# Patient Record
Sex: Female | Born: 1985 | Race: White | Hispanic: No | Marital: Single | Smoking: Current every day smoker
Health system: Southern US, Community
[De-identification: ages and names within clinical notes are randomized; demographics above are authoritative.]

## PROBLEM LIST (undated history)

## (undated) DIAGNOSIS — J45909 Unspecified asthma, uncomplicated: Secondary | ICD-10-CM

## (undated) DIAGNOSIS — I10 Essential (primary) hypertension: Secondary | ICD-10-CM

## (undated) DIAGNOSIS — E119 Type 2 diabetes mellitus without complications: Secondary | ICD-10-CM

## (undated) HISTORY — PX: CHOLECYSTECTOMY: SHX55

---

## 2005-02-09 ENCOUNTER — Observation Stay: Payer: Self-pay | Admitting: Obstetrics and Gynecology

## 2005-06-20 ENCOUNTER — Ambulatory Visit: Payer: Self-pay | Admitting: Family Medicine

## 2005-06-23 ENCOUNTER — Observation Stay: Payer: Self-pay

## 2005-07-02 ENCOUNTER — Observation Stay: Payer: Self-pay | Admitting: Obstetrics and Gynecology

## 2005-07-03 ENCOUNTER — Inpatient Hospital Stay: Payer: Self-pay | Admitting: Obstetrics and Gynecology

## 2005-09-12 ENCOUNTER — Emergency Department: Payer: Self-pay | Admitting: Emergency Medicine

## 2005-09-12 ENCOUNTER — Other Ambulatory Visit: Payer: Self-pay

## 2005-09-13 ENCOUNTER — Ambulatory Visit: Payer: Self-pay | Admitting: Emergency Medicine

## 2005-09-13 ENCOUNTER — Emergency Department: Payer: Self-pay | Admitting: Internal Medicine

## 2005-09-18 ENCOUNTER — Emergency Department: Payer: Self-pay | Admitting: Emergency Medicine

## 2005-09-19 ENCOUNTER — Ambulatory Visit: Payer: Self-pay | Admitting: General Surgery

## 2007-06-26 ENCOUNTER — Emergency Department: Payer: Self-pay | Admitting: Emergency Medicine

## 2007-10-21 ENCOUNTER — Emergency Department: Payer: Self-pay | Admitting: Emergency Medicine

## 2008-06-21 ENCOUNTER — Emergency Department: Payer: Self-pay | Admitting: Emergency Medicine

## 2008-07-24 ENCOUNTER — Emergency Department: Payer: Self-pay | Admitting: Emergency Medicine

## 2008-08-28 ENCOUNTER — Emergency Department: Payer: Self-pay | Admitting: Emergency Medicine

## 2009-06-12 ENCOUNTER — Ambulatory Visit: Payer: Self-pay | Admitting: Family Medicine

## 2009-06-14 ENCOUNTER — Emergency Department: Payer: Self-pay | Admitting: Internal Medicine

## 2009-09-05 ENCOUNTER — Ambulatory Visit: Payer: Self-pay | Admitting: Family Medicine

## 2009-10-10 ENCOUNTER — Inpatient Hospital Stay: Payer: Self-pay | Admitting: Obstetrics and Gynecology

## 2011-06-11 ENCOUNTER — Emergency Department: Payer: Self-pay

## 2011-06-12 ENCOUNTER — Emergency Department: Payer: Self-pay | Admitting: Emergency Medicine

## 2012-03-23 ENCOUNTER — Ambulatory Visit: Payer: Self-pay

## 2012-03-29 ENCOUNTER — Emergency Department: Payer: Self-pay | Admitting: Emergency Medicine

## 2012-03-31 ENCOUNTER — Emergency Department: Payer: Self-pay | Admitting: Emergency Medicine

## 2012-04-01 ENCOUNTER — Encounter: Payer: Self-pay | Admitting: Obstetrics & Gynecology

## 2012-04-14 ENCOUNTER — Ambulatory Visit: Payer: Self-pay

## 2012-04-28 ENCOUNTER — Emergency Department: Payer: Self-pay | Admitting: Emergency Medicine

## 2012-06-25 ENCOUNTER — Observation Stay: Payer: Self-pay

## 2012-06-29 ENCOUNTER — Encounter: Payer: Self-pay | Admitting: Pediatrics

## 2012-09-13 ENCOUNTER — Observation Stay: Payer: Self-pay | Admitting: Obstetrics and Gynecology

## 2012-10-21 ENCOUNTER — Ambulatory Visit: Payer: Self-pay | Admitting: Obstetrics and Gynecology

## 2012-10-21 LAB — CBC WITH DIFFERENTIAL/PLATELET
Basophil %: 0.3 %
Eosinophil #: 0.1 10*3/uL (ref 0.0–0.7)
Eosinophil %: 1.1 %
HCT: 38.4 % (ref 35.0–47.0)
HGB: 13.3 g/dL (ref 12.0–16.0)
Lymphocyte #: 3.1 10*3/uL (ref 1.0–3.6)
MCH: 29.7 pg (ref 26.0–34.0)
MCHC: 34.6 g/dL (ref 32.0–36.0)
Monocyte #: 0.8 x10 3/mm (ref 0.2–0.9)
Neutrophil #: 7.2 10*3/uL — ABNORMAL HIGH (ref 1.4–6.5)
Platelet: 223 10*3/uL (ref 150–440)
RDW: 14.2 % (ref 11.5–14.5)
WBC: 11.2 10*3/uL — ABNORMAL HIGH (ref 3.6–11.0)

## 2012-10-22 ENCOUNTER — Inpatient Hospital Stay: Payer: Self-pay

## 2012-10-22 LAB — CBC WITH DIFFERENTIAL/PLATELET
Basophil #: 0.1 10*3/uL (ref 0.0–0.1)
Eosinophil #: 0.1 10*3/uL (ref 0.0–0.7)
Eosinophil %: 0.3 %
HGB: 11.4 g/dL — ABNORMAL LOW (ref 12.0–16.0)
Lymphocyte #: 2.4 10*3/uL (ref 1.0–3.6)
Lymphocyte %: 15.7 %
MCHC: 34 g/dL (ref 32.0–36.0)
MCV: 87 fL (ref 80–100)
Monocyte %: 6.3 %
Neutrophil %: 77.3 %
RBC: 3.87 10*6/uL (ref 3.80–5.20)
WBC: 15.2 10*3/uL — ABNORMAL HIGH (ref 3.6–11.0)

## 2012-10-23 LAB — HEMATOCRIT: HCT: 31.4 % — ABNORMAL LOW (ref 35.0–47.0)

## 2012-10-25 LAB — PATHOLOGY REPORT

## 2013-06-24 ENCOUNTER — Emergency Department: Payer: Self-pay | Admitting: Emergency Medicine

## 2013-08-08 ENCOUNTER — Emergency Department: Payer: Self-pay | Admitting: Emergency Medicine

## 2013-09-24 ENCOUNTER — Emergency Department: Payer: Self-pay | Admitting: Emergency Medicine

## 2014-08-01 NOTE — Consult Note (Signed)
Referral Information:   Reason for Referral History of IUFD in setting of poorly controlled diabetes.    Referring Physician [facility]    Prenatal Hx Brandi Myers is a 29 year-old G3 P2001 at 10 1/7 weeks with EDC of 10/27/12 by ultrasound performed at [facility] on 03/22/12 who presents for recommendations for her current pregnancy.  Her first pregnancy in 9604 was uncomplicated. She had a spontaneous vaginal delivery after spontaneous labor of an 8 pound 3 ounce female fetus at 40 weeks. She reports an uncomplicated prenatal and postpartum course.  In 2011, she was diagnosed with gestational diabetes late in pregnancy, did not check her sugars, and eventually had a fetal demise at 62 weeks. She underwent a labor induction and vacuum-assisted vaginal delivery.  The delivery was complicated by a severe should dystocia requiring McRoberts maneuver, Woods screw, suprapubic pressure, and delivery of the posterior arm with resultant fractured humerus.  I was unable to locate an ultrasound report from that pregnancy to determine if fetal anatomy was normal. Her prenatal records from this pregnancy document the presence of polyhydramnios with that pregnancy.  The workup for her IUFD included: -Fetal SNP microarray: Normal female -Placental pathology: Focal villious hypervascularity compatible with diabetic changes. No funicitis or chorioamnionitis -Protein C level: 134% -Protein S free: 52% (>35% normal in pregnancy) -Antithrombin III: 112% -Lupus anticoagulant: Neg -Anticardiolipin antibody: Neg -TORCH titers Neg for acute infection  She was never tested for type II diabetes following delivery. At time of her new obstetric visit at [place] she was found to have a hemoglobin A1C of 9.3% consistent with type II diabetes. She is currently on Insulin with a regimen of Lantus 20 units in the am and Humalog 6 units at breakfast and 5 units at dinner.  She is not taking Humalog at  [facility]. She has been seen at [facility] and has a followup appointment with them today.  Pt is planning for elective primary cesarean delivery due to history of severe shoulder dystocia.    Past Obstetrical Hx G3 P2001 See above.   Home Medications: Medication Instructions Status  amoxicillin 500 mg oral capsule 1 cap(s) orally 2 times a day x 10 days Active  Humalog 6 unit(s) subcutaneous once a day (after breakfast) Active  Humalog 5 unit(s) subcutaneous once a day after supper Active  prenatal vitamin 1  orally once a day Active  Lantus 25 unit(s) subcutaneous once a day (in the morning) Active   Allergies:   No Known Allergies:   Vital Signs/Notes:  Nursing Vital Signs: **Vital Signs.:   19-Dec-13 11:33   Pulse Pulse 97   Systolic BP Systolic BP 540   Diastolic BP (mmHg) Diastolic BP (mmHg) 78   Perinatal Consult:   PGyn Hx Remote history of abnormal pap, normal recently. Denies history of STDs.    Past Medical History cont'd 1. Type II diabetes with hemoglobin A1C of 9.3% at time of conception 2. Poor dentition    PSurg Hx Laparscopic cholecystectomy, Wisdom teeth extraction    FHx Denies a family history of genetic disorders , mental retardation,    Occupation Mother Unemployed    Soc Hx single, Smokes 1/2 ppd. Denies use of ETOH or drugs.   Review Of Systems:   Subjective No complaints. No vaginal bleeding. No leakage of fluid. No contractions.    Fever/Chills No    Abdominal Pain No    Constipation No    Nausea/Vomiting No    SOB/DOE No  Chest Pain No    Tolerating Diet Yes     Additional Lab/Radiology Notes [place] ([facility] 03/22/12): Single live IUP at 8 5/7 weeks. [facility] 10/28/11.  Prenatal labs ([facility] 03/15/12):  Blood type O positive, antibody screen negative, RPR non-reactive, Hep B negative, Rubella immune, Hct 44.7, Plt 268, HIV negative, Varicella immune,   Hemoglobin A1C ([facility] 03/15/12):  9.3%  Accuchecks (03/26/12-04/01/12) on Lantus 20 and Humalog 6/0/5: FS: 166, 112, 150, 126, 118, 140 2hr PP breakfast: 188, 122, 135, 115, 109, 113, 109 2hr PP lunch: 122, 165, 135, 107, 150, 134 2hr PP dinner: 166. No other values recorded.  IUFD workup [facility], 10/10/09): -Fetal SNP microarray: Normal female -Placental pathology: Focal villious hypervascularity compatible with diabetic changes. No funicitis or chorioamnionitis -Protein C level: 134% -Protein S free: 52% (>35% normal in pregnancy) -Antithrombin III: 112% -Lupus anticoagulant: Neg -Anticardiolipin antibody: Neg -TORCH titers Neg for acute infection -Tox screen negative -No fetal autopsy or anatomy ultrasound identified   Impression/Recommendations:   Impression 30 year-old G3 P2001 at 10 1/7 weeks by 8 week ultrasound with type II diabetes with poor control at time of conception (A1C 9.3% at conception) and prior pregnancy complicated by term IUFD in setting of poorly controlled diabetes.    Recommendations 1. Please offer CF screening and aneuploidy/ONTD screening if desired  2. Poor dentition. We discussed the association of poor dentition with preterm labor  3. Smoker. She has decreased her cigarette intake from 1 ppd to 1/2 ppd and is motivated to continue to do so. We discussed the potential adverse fetal/neonatal outcomes associated with smoking and the available smoking cessation tools available.  4. Type II Diabetes. She had poor control at time of conception with a hemoglobicn A1C of 9.3%. We discussed the risk for fetal malformations directly correlates with A1C value.  Some reports suggest the rate of malformations to be as high as 20-25% with A1C values above 8.5%. Recommend detailed ultrasound (17-18 weeks) and fetal echo (22 weeks). We will be happy to perform/arrange if desired.  She is currently on an insulin regimen that was Lantus 20 daily and Humalog 6 breakfast / 0 lunch / 5 dinner that was just  increased at the [facility] office to Lantus 25 and Humalog 6/0/5. She has seen [facility]. We will be happy to follow her sugars and adjust insulin if desired. Humalog is a rapidly acting insulin that provides coverage primarily only for the meal at which it is given. Her after lunch values are also elevated so the addition of Humalog with lunch (currently given with breakfast and dinner only) may be beneficial.  We discussed the importance of optimal glycemic control to decrease adverse fetal, neonatal and maternal complications of poorly controlled pre-gestational diabetes. We reviewed signs and symptoms of hypoglycemia and preeclampsia.     Comments Recs in summary: -Offer aneuploidy, ONTD and CF screening -Detailed anatomy scan at 17-18 weeks -Fetal echo at 22 weeks -Adjust insulin to maintain fasting sugars less than 90-100 and 2hr PP less than 120-130.  -We reviewed signs/symptoms of hypoglycemia -Recommend a glucagon kit -Referral to opthalmology -Fetal testing: Twice weekly testing starting at 32 weeks -Follow fetal growth with monthly ultrasounds starting at 24-28 weeks -Delivery via primary cesarean (patient desires) at 39 weeks or sooner with documentation of fetal lung maturity -Stop smoking -Poor dentition. Needs to be seen by her dentist -At risk for preeclampsia so recommend obtaining baseline liver function tests, uric acid and  24 hour urine -Baseline EKG if not yet obtained -We will be happy to see her to follow sugars/adjust insulin if desired. Please contact our office if desired -We obtained the following blood tests to complete her thrombophilia panel: Factor V Leiden, Prothrombin gene mutation, and anti-beta 2 glycoprotein.     Total Time Spent with Patient 60 minutes    >50% of visit spent in couseling/coordination of care yes    Office Use Only 99[address]  Level 4 (49mn) NEW office consult low complexity   Coding Description: MATERNAL CONDITIONS/HISTORY  INDICATION(S).   OTHER: Type II diabetes poor control, history of fetal demise.  Electronic Signatures: Tremaine Fuhriman, CMali(MD)  (Signed 19-Dec-13 17:15)  Authored: Referral, Home Medications, Allergies, Vital Signs/Notes, Consult, Exam, Lab/Radiology Notes, Impression, Other Comments, Billing, Coding Description   Last Updated: 19-Dec-13 17:15 by Kelden Lavallee, CMali(MD)

## 2014-08-04 NOTE — Op Note (Signed)
PATIENT NAME:  Brandi MaizesRICE, Korri L MR#:  [id number] DATE OF BIRTH:  February 16, 1986  DATE OF PROCEDURE:  10/22/2012  PREOPERATIVE DIAGNOSES:  1.  History of severe shoulder dystocia. 2.  Insulin-dependent diabetic.  3.  Elective permanent sterilization.   POSTOPERATIVE DIAGNOSES:  1.  History of severe shoulder dystocia. 2.  Insulin-dependent diabetic.  3.  Elective permanent sterilization.   PROCEDURES:  1.  Primary low transverse cesarean section.  2.  Bilateral tubal ligation, [place].  3.  On-Q pump placement.   SURGEON: Suzy Bouchardhomas J. Schermerhorn, MD   FIRST ASSISTANT: Acquanetta BellingAngela Lugiano, CNM   ANESTHESIA: Spinal.   INDICATION: A 29 year old gravida 3, para 2. The patient has EDC of 10/27/2012.  The patient is an insulin-dependent gestational diabetic. The patient with a prior history of an IUFD with severe shoulder dystocia. The patient elects for permanent sterilization and reconfirms this on the day of the procedure.   DESCRIPTION OF PROCEDURE: After adequate spinal anesthesia, the patient was placed in the dorsal supine position with a hip roll under the right side. The patient's abdomen was prepped and draped in normal sterile fashion. A Pfannenstiel incision was made 2 fingerbreadths above the symphysis pubis. Sharp dissection was used to identify the fascia. The fascia was opened in the midline and opened in a transverse fashion. The superior aspect of the fascia was grasped with Kocher clamps and the recti muscles dissected free. The inferior aspect of the fascia was grasped with Kocher clamps. Pyramidalis muscle dissected free. Entry into the peritoneal cavity was accomplished sharply. The vesicouterine peritoneal fold was identified and opened, a bladder flap was created and the bladder was reflected inferiorly. A low transverse uterine incision was made. Upon entry into the endometrial cavity, thin meconium was noted. The fetal head was brought to the incision. A vacuum was applied to the  occiput and with 1 gentle pull with the vacuum fetal head was delivered. The vacuum was removed and the shoulders and body delivered without difficulty. A vigorous female was passed to nursery staff who assigned Apgar scores of 8 and 9. The placenta was manually delivered and the uterus was exteriorized. The endometrial cavity was wiped clean with laparotomy tape, and the cervix was opened with a ring forceps. Uterine incision was closed with 1 chromic suture in a running locking fashion with good hemostasis noted. Attention was directed to the patient's right fallopian tube which was grasped at the midportion with a Babcock clamp. Two separate 0 plain gut sutures were applied to the fallopian tube and a 1.5 cm portion of fallopian tube removed. Good hemostasis was noted. A similar procedure was repeated on the patient's left fallopian tube. After placing 2-0 plain gut sutures, a 1.5 cm portion of fallopian tube removed. Good hemostasis noted. The posterior cul-de-sac was irrigated and suctioned and the uterus was placed back into the abdominal cavity.  The uterine incision appeared hemostatic. The paracolic gutters were wiped clean with laparotomy tape. Tubal ligation sites also hemostatic. Interceed placed over the uterine incision in a T-shaped fashion. The fascia was then grasped with Kocher clamps and the On-Q pump catheters were advanced from an infraumbilical position to a subfascial position. The fascia was then closed over top of the catheters with 0 Vicryl suture. Subcutaneous tissues were irrigated and bovied for hemostasis. The skin was reapproximated with staples. The catheters were secured at the skin level with Dermabond, and the On-Q pump catheters were secured with Steri-Strips and Tegaderm. Each catheter was loaded with 5 mL  of 0.5% Marcaine. There were no complications. Estimated blood loss 700 mL.  The patient's in and out fluids 1100 mL. She tolerated the procedure well and was taken to the  recovery room in good condition.  ____________________________ Suzy Bouchard, MD tjs:sb D: 10/22/2012 08:45:09 ET T: 10/22/2012 10:06:33 ET JOB#: [id number]  cc: Suzy Bouchard, MD, <Dictator> Suzy Bouchard MD ELECTRONICALLY SIGNED 10/24/2012 13:50

## 2014-08-11 ENCOUNTER — Emergency Department
Admit: 2014-08-11 | Disposition: A | Discharge status: Home / Self Care | Payer: Self-pay | Admitting: Emergency Medicine

## 2014-11-17 ENCOUNTER — Encounter: Payer: Self-pay | Admitting: Emergency Medicine

## 2014-11-17 ENCOUNTER — Emergency Department
Admission: EM | Admit: 2014-11-17 | Discharge: 2014-11-17 | Disposition: A | Discharge status: Home / Self Care | Payer: Medicaid Other | Attending: Student | Admitting: Student

## 2014-11-17 DIAGNOSIS — E119 Type 2 diabetes mellitus without complications: Secondary | ICD-10-CM | POA: Diagnosis not present

## 2014-11-17 DIAGNOSIS — Z720 Tobacco use: Secondary | ICD-10-CM | POA: Insufficient documentation

## 2014-11-17 DIAGNOSIS — Z9889 Other specified postprocedural states: Secondary | ICD-10-CM

## 2014-11-17 DIAGNOSIS — L02415 Cutaneous abscess of right lower limb: Secondary | ICD-10-CM | POA: Diagnosis present

## 2014-11-17 DIAGNOSIS — L03115 Cellulitis of right lower limb: Secondary | ICD-10-CM | POA: Diagnosis not present

## 2014-11-17 DIAGNOSIS — Z792 Long term (current) use of antibiotics: Secondary | ICD-10-CM | POA: Diagnosis not present

## 2014-11-17 HISTORY — DX: Type 2 diabetes mellitus without complications: E11.9

## 2014-11-17 MED ORDER — SULFAMETHOXAZOLE-TRIMETHOPRIM 800-160 MG PO TABS: 1.0000 | ORAL_TABLET | Freq: Two times a day (BID) | ORAL | Status: DC

## 2014-11-17 MED ORDER — TRAMADOL HCL 50 MG PO TABS: 50.0000 mg | ORAL_TABLET | Freq: Two times a day (BID) | ORAL | Status: DC

## 2014-11-17 MED ORDER — LIDOCAINE HCL (PF) 1 % IJ SOLN
5.0000 mL | Freq: Once | INTRAMUSCULAR | Status: DC
  Filled 2014-11-17: qty 5

## 2014-11-17 NOTE — ED Notes (Signed)
Patient to ER for abscess to right buttock. States she believes an insect bit her. Had small amount of drainage on first day, none since.

## 2014-11-17 NOTE — Discharge Instructions (Signed)
Abscess An abscess (boil or furuncle) is an infected area on or under the skin. This area is filled with yellowish-white fluid (pus) and other material (debris). HOME CARE   Only take medicines as told by your doctor.  If you were given antibiotic medicine, take it as directed. Finish the medicine even if you start to feel better.  If gauze is used, follow your doctor's directions for changing the gauze.  To avoid spreading the infection:  Keep your abscess covered with a bandage.  Wash your hands well.  Do not share personal care items, towels, or whirlpools with others.  Avoid skin contact with others.  Keep your skin and clothes clean around the abscess.  Keep all doctor visits as told. GET HELP RIGHT AWAY IF:   You have more pain, puffiness (swelling), or redness in the wound site.  You have more fluid or blood coming from the wound site.  You have muscle aches, chills, or you feel sick.  You have a fever. MAKE SURE YOU:   Understand these instructions.  Will watch your condition.  Will get help right away if you are not doing well or get worse. Document Released: 09/17/2007 Document Revised: 09/30/2011 Document Reviewed: 06/13/2011 [facility] Patient Information 2015 [place], [place]. This information is not intended to replace advice given to you by your health care provider. Make sure you discuss any questions you have with your health care provider.  Abscess Care After An abscess (also called a boil or furuncle) is an infected area that contains a collection of pus. Signs and symptoms of an abscess include pain, tenderness, redness, or hardness, or you may feel a moveable soft area under your skin. An abscess can occur anywhere in the body. The infection may spread to surrounding tissues causing cellulitis. A cut (incision) by the surgeon was made over your abscess and the pus was drained out. Gauze may have been packed into the space to provide a drain that will  allow the cavity to heal from the inside outwards. The boil may be painful for 5 to 7 days. Most people with a boil do not have high fevers. Your abscess, if seen early, may not have localized, and may not have been lanced. If not, another appointment may be required for this if it does not get better on its own or with medications. HOME CARE INSTRUCTIONS   Only take over-the-counter or prescription medicines for pain, discomfort, or fever as directed by your caregiver.  When you bathe, soak and then remove gauze or iodoform packs at least daily or as directed by your caregiver. You may then wash the wound gently with mild soapy water. Repack with gauze or do as your caregiver directs. SEEK IMMEDIATE MEDICAL CARE IF:   You develop increased pain, swelling, redness, drainage, or bleeding in the wound site.  You develop signs of generalized infection including muscle aches, chills, fever, or a general ill feeling.  An oral temperature above 102 F (38.9 C) develops, not controlled by medication. See your caregiver for a recheck if you develop any of the symptoms described above. If medications (antibiotics) were prescribed, take them as directed. Document Released: 10/17/2004 Document Revised: 06/23/2011 Document Reviewed: 06/14/2007 [facility] Patient Information 2015 [place], [place]. This information is not intended to replace advice given to you by your health care provider. Make sure you discuss any questions you have with your health care provider.  Incision and Drainage Incision and drainage is a procedure in which a sac-like structure (  cystic structure) is opened and drained. The area to be drained usually contains material such as pus, fluid, or blood.  LET YOUR CAREGIVER KNOW ABOUT:   Allergies to medicine.  Medicines taken, including vitamins, herbs, eyedrops, over-the-counter medicines, and creams.  Use of steroids (by mouth or creams).  Previous problems with anesthetics or  numbing medicines.  History of bleeding problems or blood clots.  Previous surgery.  Other health problems, including diabetes and kidney problems.  Possibility of pregnancy, if this applies. RISKS AND COMPLICATIONS  Pain.  Bleeding.  Scarring.  Infection. BEFORE THE PROCEDURE  You may need to have an ultrasound or other imaging tests to see how large or deep your cystic structure is. Blood tests may also be used to determine if you have an infection or how severe the infection is. You may need to have a tetanus shot. PROCEDURE  The affected area is cleaned with a cleaning fluid. The cyst area will then be numbed with a medicine (local anesthetic). A small incision will be made in the cystic structure. A syringe or catheter may be used to drain the contents of the cystic structure, or the contents may be squeezed out. The area will then be flushed with a cleansing solution. After cleansing the area, it is often gently packed with a gauze or another wound dressing. Once it is packed, it will be covered with gauze and tape or some other type of wound dressing. AFTER THE PROCEDURE   Often, you will be allowed to go home right after the procedure.  You may be given antibiotic medicine to prevent or heal an infection.  If the area was packed with gauze or some other wound dressing, you will likely need to come back in 1 to 2 days to get it removed.  The area should heal in about 14 days. Document Released: 09/24/2000 Document Revised: 09/30/2011 Document Reviewed: 05/26/2011 [facility] Patient Information 2015 [place], [place]. This information is not intended to replace advice given to you by your health care provider. Make sure you discuss any questions you have with your health care provider.  Keep the wound clean, dry, and covered.  Apply warm compresses, over the dressing to promote healing.  Return to the ED as needed, for wound checks. Take the antibiotic as directed until  completley gone.

## 2014-11-17 NOTE — ED Provider Notes (Signed)
[facility] Emergency Department Provider Note ____________________________________________  Time seen: 1500  I have reviewed the triage vital signs and the nursing notes.  HISTORY  Chief Complaint  Abscess  HPI Brandi Myers is a 29 y.o. female reports to the ED for evaluation management of tender swollen abscess to the right buttocks. She thought that an insect bit her few days ago, and she did develop small amount of drainage on day one. Since that time she's only had some increase redness around the area of firmness. She denies any fever, chills, sweats, nausea, vomiting, or dizziness.  Past Medical History  Diagnosis Date  . Diabetes mellitus without complication     There are no active problems to display for this patient.   Past Surgical History  Procedure Laterality Date  . Cholecystectomy    . Cesarean section      Current Outpatient Rx  Name  Route  Sig  Dispense  Refill  . sulfamethoxazole-trimethoprim (BACTRIM DS,SEPTRA DS) 800-160 MG per tablet   Oral   Take 1 tablet by mouth 2 (two) times daily.   20 tablet   0   . traMADol (ULTRAM) 50 MG tablet   Oral   Take 1 tablet (50 mg total) by mouth 2 (two) times daily.   10 tablet   0    Allergies Review of patient's allergies indicates no known allergies.  No family history on file.  Social History History  Substance Use Topics  . Smoking status: Current Every Day Smoker  . Smokeless tobacco: Not on file  . Alcohol Use: Yes     Comment: occ.   Review of Systems  Constitutional: Negative for fever. Eyes: Negative for visual changes. ENT: Negative for sore throat. Cardiovascular: Negative for chest pain. Respiratory: Negative for shortness of breath. Gastrointestinal: Negative for abdominal pain, vomiting and diarrhea. Genitourinary: Negative for dysuria. Musculoskeletal: Negative for back pain. Skin: Negative for rash. Tender, swollen area as above.  Neurological:  Negative for headaches, focal weakness or numbness. ____________________________________________  PHYSICAL EXAM:  VITAL SIGNS: ED Triage Vitals  Enc Vitals Group     BP 11/17/14 1349 131/91 mmHg     Pulse Rate 11/17/14 1349 92     Resp 11/17/14 1349 18     Temp 11/17/14 1349 98.1 F (36.7 C)     Temp Source 11/17/14 1349 Oral     SpO2 11/17/14 1349 97 %     Weight 11/17/14 1349 182 lb (82.555 kg)     Height 11/17/14 1349 5\' 6"  (1.676 m)     Head Cir --      Peak Flow --      Pain Score 11/17/14 1350 8     Pain Loc --      Pain Edu? --      Excl. in GC? --    Constitutional: Alert and oriented. Well appearing and in no distress. Eyes: Conjunctivae are normal. PERRL. Normal extraocular movements. ENT   Head: Normocephalic and atraumatic.   Nose: No congestion/rhinnorhea.   Mouth/Throat: Mucous membranes are moist.   Neck: Supple. No thyromegaly. Hematological/Lymphatic/Immunilogical: No cervical lymphadenopathy. Cardiovascular: Normal rate, regular rhythm.  Respiratory: Normal respiratory effort. No wheezes/rales/rhonchi. Gastrointestinal: Soft and nontender. No distention. Musculoskeletal: Nontender with normal range of motion in all extremities.  Neurologic:  Normal gait without ataxia. Normal speech and language. No gross focal neurologic deficits are appreciated. Skin:  Skin is warm, dry and intact. No rash noted. Right upper thigh at buttock, there  is a small area of induration, measuring about 2 cm, with surrounding erythema measuring 6 cm in diameter. No active drainage noted.  Psychiatric: Mood and affect are normal. Patient exhibits appropriate insight and judgment. ____________________________________________  INCISION AND DRAINAGE Performed by: Lissa Hoard Consent: Verbal consent obtained. Risks and benefits: risks, benefits and alternatives were discussed Type: abscess  Body area: right posterior thigh  Anesthesia: local  infiltration  Incision was made with a scalpel.  Local anesthetic: lidocaine 1% w/o epinephrine  Anesthetic total: 5 ml  Complexity: complex Blunt dissection to break up loculations  Drainage: purulent  Drainage amount: scant  Patient tolerance: Patient tolerated the procedure well with no immediate complications. ____________________________________________  INITIAL IMPRESSION / ASSESSMENT AND PLAN / ED COURSE  Right thigh cellulitis with small, central abscess formation, s/p I&D. Prescription Bactrim DS #20 and Ultram #10. Return as needed for wound check.  ____________________________________________  FINAL CLINICAL IMPRESSION(S) / ED DIAGNOSES  Final diagnoses:  Cellulitis of right lower extremity  Abscess of right thigh  Status post incision and drainage     Lissa Hoard, PA-C 11/17/14 1553  Charlesetta Ivory [place], PA-C 11/19/14 1610  Gayla Doss, MD 11/19/14 [id number]

## 2015-06-23 ENCOUNTER — Encounter: Payer: Self-pay | Admitting: Emergency Medicine

## 2015-06-23 ENCOUNTER — Emergency Department
Admission: EM | Admit: 2015-06-23 | Discharge: 2015-06-23 | Disposition: A | Discharge status: Home / Self Care | Payer: Medicaid Other | Attending: Emergency Medicine | Admitting: Emergency Medicine

## 2015-06-23 DIAGNOSIS — Y9289 Other specified places as the place of occurrence of the external cause: Secondary | ICD-10-CM | POA: Insufficient documentation

## 2015-06-23 DIAGNOSIS — Y998 Other external cause status: Secondary | ICD-10-CM | POA: Diagnosis not present

## 2015-06-23 DIAGNOSIS — H60391 Other infective otitis externa, right ear: Secondary | ICD-10-CM

## 2015-06-23 DIAGNOSIS — Y9389 Activity, other specified: Secondary | ICD-10-CM | POA: Diagnosis not present

## 2015-06-23 DIAGNOSIS — X58XXXA Exposure to other specified factors, initial encounter: Secondary | ICD-10-CM | POA: Insufficient documentation

## 2015-06-23 DIAGNOSIS — L089 Local infection of the skin and subcutaneous tissue, unspecified: Secondary | ICD-10-CM | POA: Insufficient documentation

## 2015-06-23 DIAGNOSIS — F17200 Nicotine dependence, unspecified, uncomplicated: Secondary | ICD-10-CM | POA: Diagnosis not present

## 2015-06-23 DIAGNOSIS — E119 Type 2 diabetes mellitus without complications: Secondary | ICD-10-CM | POA: Insufficient documentation

## 2015-06-23 DIAGNOSIS — S0991XA Unspecified injury of ear, initial encounter: Secondary | ICD-10-CM | POA: Diagnosis present

## 2015-06-23 DIAGNOSIS — Z79899 Other long term (current) drug therapy: Secondary | ICD-10-CM | POA: Diagnosis not present

## 2015-06-23 MED ORDER — IBUPROFEN 800 MG PO TABS
800.0000 mg | ORAL_TABLET | Freq: Once | ORAL | Status: AC
  Administered 2015-06-23: 800 mg via ORAL
  Filled 2015-06-23: qty 1

## 2015-06-23 MED ORDER — SULFAMETHOXAZOLE-TRIMETHOPRIM 800-160 MG PO TABS
1.0000 | ORAL_TABLET | Freq: Once | ORAL | Status: AC
  Administered 2015-06-23: 1 via ORAL
  Filled 2015-06-23: qty 1

## 2015-06-23 MED ORDER — IBUPROFEN 800 MG PO TABS: 800.0000 mg | ORAL_TABLET | Freq: Three times a day (TID) | ORAL | Status: DC | PRN

## 2015-06-23 MED ORDER — OXYCODONE-ACETAMINOPHEN 5-325 MG PO TABS
1.0000 | ORAL_TABLET | Freq: Once | ORAL | Status: AC
  Administered 2015-06-23: 1 via ORAL
  Filled 2015-06-23: qty 1

## 2015-06-23 MED ORDER — OXYCODONE-ACETAMINOPHEN 7.5-325 MG PO TABS: 1.0000 | ORAL_TABLET | ORAL | Status: DC | PRN

## 2015-06-23 MED ORDER — SULFAMETHOXAZOLE-TRIMETHOPRIM 800-160 MG PO TABS: 1.0000 | ORAL_TABLET | Freq: Two times a day (BID) | ORAL | Status: DC

## 2015-06-23 NOTE — Discharge Instructions (Signed)
Take medication as directed and apply warm compresses to the area 3-4 times a day for 5 minutes.

## 2015-06-23 NOTE — ED Provider Notes (Signed)
[facility] Emergency Department Provider Note ____________________________________________  Time seen: Approximately 6:16 PM  I have reviewed the triage vital signs and the nursing notes.   HISTORY  Chief Complaint Ear Injury    HPI Brandi Myers is a 30 y.o. female patient complaining of right ear pain swelling and drainage for 2 days. Patient stated her earring stuck in her child's clothesing and was ripped from her ear. Patient state last night she started noticing increased swelling, redness and discharge. No palliative measures taken for this complaint. Patient rates the pain discomfort as as a 10 over 10. Patient described a pain as sharp.  Past Medical History  Diagnosis Date  . Diabetes mellitus without complication (HCC)     There are no active problems to display for this patient.   Past Surgical History  Procedure Laterality Date  . Cholecystectomy    . Cesarean section      Current Outpatient Rx  Name  Route  Sig  Dispense  Refill  . ibuprofen (ADVIL,MOTRIN) 800 MG tablet   Oral   Take 1 tablet (800 mg total) by mouth every 8 (eight) hours as needed for moderate pain.   15 tablet   0   . oxyCODONE-acetaminophen (PERCOCET) 7.5-325 MG tablet   Oral   Take 1 tablet by mouth every 4 (four) hours as needed for severe pain.   20 tablet   0   . sulfamethoxazole-trimethoprim (BACTRIM DS,SEPTRA DS) 800-160 MG per tablet   Oral   Take 1 tablet by mouth 2 (two) times daily.   20 tablet   0   . sulfamethoxazole-trimethoprim (BACTRIM DS,SEPTRA DS) 800-160 MG tablet   Oral   Take 1 tablet by mouth 2 (two) times daily.   20 tablet   0   . traMADol (ULTRAM) 50 MG tablet   Oral   Take 1 tablet (50 mg total) by mouth 2 (two) times daily.   10 tablet   0     Allergies Review of patient's allergies indicates no known allergies.  No family history on file.  Social History Social History  Substance Use Topics  . Smoking status:  Current Every Day Smoker  . Smokeless tobacco: None  . Alcohol Use: Yes     Comment: occ.    Review of Systems Constitutional: No fever/chills Eyes: No visual changes. ENT: No sore throat. Cardiovascular: Denies chest pain. Respiratory: Denies shortness of breath. Gastrointestinal: No abdominal pain.  No nausea, no vomiting.  No diarrhea.  No constipation. Genitourinary: Negative for dysuria. Musculoskeletal: Negative for back pain. Skin: Negative for rash. Redness and swelling to the upper right ear. Neurological: Negative for headaches, focal weakness or numbness. 10-point ROS otherwise negative.  ____________________________________________   PHYSICAL EXAM:  VITAL SIGNS: ED Triage Vitals  Enc Vitals Group     BP 06/23/15 1717 118/91 mmHg     Pulse Rate 06/23/15 1717 89     Resp 06/23/15 1717 20     Temp 06/23/15 1717 98.4 F (36.9 C)     Temp Source 06/23/15 1717 Oral     SpO2 06/23/15 1717 98 %     Weight 06/23/15 1717 200 lb (90.719 kg)     Height 06/23/15 1717  (1.676 m)     Head Cir --      Peak Flow --      Pain Score 06/23/15 1719 10     Pain Loc --      Pain Edu? --  Excl. in GC? --     Constitutional: Alert and oriented. Well appearing and in no acute distress. Eyes: Conjunctivae are normal. PERRL. EOMI. Head: Atraumatic. Nose: No congestion/rhinnorhea. Mouth/Throat: Mucous membranes are moist.  Oropharynx non-erythematous. Neck: No stridor.  No cervical spine tenderness to palpation. Cardiovascular: Normal rate, regular rhythm. Grossly normal heart sounds.  Good peripheral circulation. Respiratory: Normal respiratory effort.  No retractions. Lungs CTAB. Gastrointestinal: Soft and nontender. No distention. No abdominal bruits. No CVA tenderness. Musculoskeletal: No lower extremity tenderness nor edema.  No joint effusions. Neurologic:  Normal speech and language. No gross focal neurologic deficits are appreciated. No gait instability. Skin:   Skin is warm, dry and intact. No rash noted. Edema and erythema inferior aspect of the right ear. No active draining at this time. Psychiatric: Mood and affect are normal. Speech and behavior are normal.  ____________________________________________   LABS (all labs ordered are listed, but only abnormal results are displayed)  Labs Reviewed - No data to display ____________________________________________  EKG   ____________________________________________  RADIOLOGY   ____________________________________________   PROCEDURES  Procedure(s) performed: None  Critical Care performed: No  ____________________________________________   INITIAL IMPRESSION / ASSESSMENT AND PLAN / ED COURSE  Pertinent labs & imaging results that were available during my care of the patient were reviewed by me and considered in my medical decision making (see chart for details).  Bacterial skin infections in the right ear. Patient given discharge care instructions. Patient given prescription for Bactrim DS and Percocets. Patient advised to return to ER for condition worsens. ____________________________________________   FINAL CLINICAL IMPRESSION(S) / ED DIAGNOSES  Final diagnoses:  Infection of skin of ear lobe, right      Joni ReiningRonald K Delitha Elms, PA-C 06/23/15 1837  Emily FilbertJonathan E Williams, MD 06/23/15 [phone]

## 2015-06-23 NOTE — ED Notes (Signed)
Accidentally pulled earring on R outer ear 2 days ago. Reddened.

## 2015-07-31 ENCOUNTER — Encounter: Payer: Self-pay | Admitting: Emergency Medicine

## 2015-07-31 ENCOUNTER — Emergency Department
Admission: EM | Admit: 2015-07-31 | Discharge: 2015-07-31 | Disposition: A | Discharge status: Home / Self Care | Payer: Medicaid Other | Attending: Emergency Medicine | Admitting: Emergency Medicine

## 2015-07-31 DIAGNOSIS — E119 Type 2 diabetes mellitus without complications: Secondary | ICD-10-CM | POA: Insufficient documentation

## 2015-07-31 DIAGNOSIS — Y999 Unspecified external cause status: Secondary | ICD-10-CM | POA: Insufficient documentation

## 2015-07-31 DIAGNOSIS — S025XXA Fracture of tooth (traumatic), initial encounter for closed fracture: Secondary | ICD-10-CM | POA: Diagnosis not present

## 2015-07-31 DIAGNOSIS — Y939 Activity, unspecified: Secondary | ICD-10-CM | POA: Insufficient documentation

## 2015-07-31 DIAGNOSIS — Z79899 Other long term (current) drug therapy: Secondary | ICD-10-CM | POA: Diagnosis not present

## 2015-07-31 DIAGNOSIS — K029 Dental caries, unspecified: Secondary | ICD-10-CM | POA: Diagnosis not present

## 2015-07-31 DIAGNOSIS — Z791 Long term (current) use of non-steroidal anti-inflammatories (NSAID): Secondary | ICD-10-CM | POA: Diagnosis not present

## 2015-07-31 DIAGNOSIS — X58XXXA Exposure to other specified factors, initial encounter: Secondary | ICD-10-CM | POA: Diagnosis not present

## 2015-07-31 DIAGNOSIS — F17200 Nicotine dependence, unspecified, uncomplicated: Secondary | ICD-10-CM | POA: Diagnosis not present

## 2015-07-31 DIAGNOSIS — Y929 Unspecified place or not applicable: Secondary | ICD-10-CM | POA: Diagnosis not present

## 2015-07-31 DIAGNOSIS — K0889 Other specified disorders of teeth and supporting structures: Secondary | ICD-10-CM | POA: Diagnosis present

## 2015-07-31 MED ORDER — IBUPROFEN 800 MG PO TABS: 800.0000 mg | ORAL_TABLET | Freq: Three times a day (TID) | ORAL | Status: DC | PRN

## 2015-07-31 MED ORDER — OXYCODONE-ACETAMINOPHEN 7.5-325 MG PO TABS: 1.0000 | ORAL_TABLET | Freq: Four times a day (QID) | ORAL | Status: DC | PRN

## 2015-07-31 MED ORDER — AMOXICILLIN 500 MG PO CAPS: 500.0000 mg | ORAL_CAPSULE | Freq: Three times a day (TID) | ORAL | Status: DC

## 2015-07-31 MED ORDER — LIDOCAINE VISCOUS 2 % MT SOLN
15.0000 mL | Freq: Once | OROMUCOSAL | Status: AC
  Administered 2015-07-31: 15 mL via OROMUCOSAL
  Filled 2015-07-31: qty 15

## 2015-07-31 MED ORDER — IBUPROFEN 800 MG PO TABS
800.0000 mg | ORAL_TABLET | Freq: Once | ORAL | Status: AC
  Administered 2015-07-31: 800 mg via ORAL
  Filled 2015-07-31: qty 1

## 2015-07-31 MED ORDER — OXYCODONE-ACETAMINOPHEN 5-325 MG PO TABS
1.0000 | ORAL_TABLET | Freq: Once | ORAL | Status: AC
  Administered 2015-07-31: 1 via ORAL
  Filled 2015-07-31: qty 1

## 2015-07-31 NOTE — Discharge Instructions (Signed)
Follow-up from the list of dental clinics provided. °OPTIONS FOR DENTAL FOLLOW UP CARE ° °[place] [facility] - Local Safety Net Dental Clinics °http://www.ncdhhs.gov/dph/oralhealth/services/safetynetclinics.htm °  °[facility] (336-562-3123) ° °[facility] [place] ([phone]) ° °[facility] ([phone] ext [phone]) ° °[facility] ([phone]) ° °[facility] ([phone]) °This clinic caters to the indigent population and is on a lottery system. °Location: °[facility], [address], [place] °Clinic Hours: °Wednesdays from 6pm - 9pm, patients seen by a lottery system. °For dates, call or go to www.med.[facility].edu/[facility]/patients/Dental-[facility] °Services: °Cleanings, fillings and simple extractions. °Payment Options: °DENTAL WORK IS FREE OF CHARGE. Bring proof of income or support. °Best way to get seen: °Arrive at 5:15 pm - this is a lottery, NOT first come/first serve, so arriving earlier will not increase your chances of being seen. °  °  °[facility] °[phone] °Select option 1 for emergencies °  °Location: °[facility], [address], [place] °Clinic Hours: °No walk-ins accepted - call the day before to schedule an appointment. °Check in times are 9:30 am and 1:30 pm. °Services: °Simple extractions, temporary fillings, pulpectomy/pulp debridement, uncomplicated abscess drainage. °Payment Options: °PAYMENT IS DUE AT THE TIME OF SERVICE.  Fee is usually $100-200, additional surgical procedures (e.g. abscess drainage) may be extra. °Cash, checks, Visa/MasterCard accepted.  Can file Medicaid if patient is covered for dental - patient should call case worker to check. °No discount for [facility] patients. °Best way to get seen: °MUST call the day before and get onto the schedule. Can usually be seen the next 1-2 days. No walk-ins accepted. °  °   °[facility] °[phone] °  °Location: °[facility], [address], [place] °Clinic Hours: °M, W, Th, F 8am or 1:30pm, Tues 9a or 1:30 - first come/first served. °Services: °Simple extractions, temporary fillings, uncomplicated abscess drainage.  You do not need to be an [place] resident. °Payment Options: °PAYMENT IS DUE AT THE TIME OF SERVICE. °Dental insurance, otherwise sliding scale - bring proof of income or support. °Depending on income and treatment needed, cost is usually $50-200. °Best way to get seen: °Arrive early as it is first come/first served. °  °  °[facility] °[phone] °  °Location: °[address] °Clinic Hours: °Mon-Thu 8a-5p °Services: °Most basic dental services including extractions and fillings. °Payment Options: °PAYMENT IS DUE AT THE TIME OF SERVICE. °Sliding scale, up to 50% off - bring proof if income or support. °Medicaid with dental option accepted. °Best way to get seen: °Call to schedule an appointment, can usually be seen within 2 weeks OR they will try to see walk-ins - show up at 8a or 2p (you may have to wait). °  °  °[facility] °[phone] °[place] RESIDENTS ONLY °  °Location: °[facility], [address], [place], [place] [address] °Clinic Hours: By appointment only. °Monday - Thursday 8am-5pm, Friday 8am-12pm °Services: Cleanings, fillings, extractions. °Payment Options: °PAYMENT IS DUE AT THE TIME OF SERVICE. °Cash, Visa or MasterCard. Sliding scale - $30 minimum per service. °Best way to get seen: °Come in to office, complete packet and make an appointment - need proof of income °or support monies for each household member and proof of [place] residence. °Usually takes about a month to get in. °  °  °[facility] °[phone] °  °Location: °[address] [place] °Clinic Hours: Walk-in Urgent Care  Dental Services are offered Monday-Friday mornings only. °The numbers of emergencies accepted daily is limited to the number of °providers available. °Maximum 15 - Mondays, Wednesdays & Thursdays °Maximum 10 - Tuesdays & Fridays °Services: °You do not need to be a [place] resident to be seen for a dental emergency. °Emergencies are defined as pain, swelling, abnormal bleeding, or dental trauma. Walkins will receive x-rays if needed. °NOTE: Dental cleaning is not an emergency. °Payment Options: °PAYMENT IS DUE AT THE TIME OF SERVICE. °Minimum co-pay is $40.00 for uninsured patients. °Minimum co-pay is $3.00 for Medicaid with dental coverage. °Dental Insurance is accepted and must be presented at time of visit. °Medicare does not cover dental. °Forms of payment: Cash, credit card, checks. °Best way to get seen: °If not previously registered with the clinic, walk-in dental registration begins at 7:15 am and is on a first come/first serve basis. °If previously registered with the clinic, call to make an appointment. °  °  °[facility] °[phone] °[place] RESIDENTS ONLY °  °Location: °[address], [place], [place] °Clinic Hours: °Mon-Thu 10a-2p °Services: Extractions only! °Payment Options: °FREE (donations accepted) - bring proof of income or support °Best way to get seen: °Call and schedule an appointment OR come at 8am on the 1st Monday of every month (except for holidays) when it is first come/first served. °  °  °[facility] °[phone] °  °Location: °[address], [place] °Clinic Hours: °Friday mornings °Services, Payment Options, Best way to get seen: °Call for info ° °

## 2015-07-31 NOTE — ED Notes (Signed)
Toothache, no resp distress 

## 2015-07-31 NOTE — ED Provider Notes (Signed)
[facility] Emergency Department Provider Note  ____________________________________________  Time seen: Approximately 3:22 PM  I have reviewed the triage vital signs and the nursing notes.   HISTORY  Chief Complaint Dental Pain    HPI Brandi Myers is a 30 y.o. female patient complaining of dental pain for 3 days. Patient has a fractured of her right upper incisor. Patient say her dentist office is closed for renovations this week. Patient rates the pain as a 10 over 10. Patient describes the pain as achy and progressed to sharp. Patient state unable to chew and affect side. Except for ibuprofen of the pelvis measures taken for this complaint. Patient denies any swelling or drainage. Patient denies any fever with this complaint.   Past Medical History  Diagnosis Date  . Diabetes mellitus without complication (HCC)     There are no active problems to display for this patient.   Past Surgical History  Procedure Laterality Date  . Cholecystectomy    . Cesarean section      Current Outpatient Rx  Name  Route  Sig  Dispense  Refill  . amoxicillin (AMOXIL) 500 MG capsule   Oral   Take 1 capsule (500 mg total) by mouth 3 (three) times daily.   30 capsule   0   . ibuprofen (ADVIL,MOTRIN) 800 MG tablet   Oral   Take 1 tablet (800 mg total) by mouth every 8 (eight) hours as needed for moderate pain.   15 tablet   0   . ibuprofen (ADVIL,MOTRIN) 800 MG tablet   Oral   Take 1 tablet (800 mg total) by mouth every 8 (eight) hours as needed for moderate pain.   15 tablet   0   . oxyCODONE-acetaminophen (PERCOCET) 7.5-325 MG tablet   Oral   Take 1 tablet by mouth every 4 (four) hours as needed for severe pain.   20 tablet   0   . oxyCODONE-acetaminophen (PERCOCET) 7.5-325 MG tablet   Oral   Take 1 tablet by mouth every 6 (six) hours as needed for severe pain.   12 tablet   0   . sulfamethoxazole-trimethoprim (BACTRIM DS,SEPTRA DS) 800-160 MG  per tablet   Oral   Take 1 tablet by mouth 2 (two) times daily.   20 tablet   0   . sulfamethoxazole-trimethoprim (BACTRIM DS,SEPTRA DS) 800-160 MG tablet   Oral   Take 1 tablet by mouth 2 (two) times daily.   20 tablet   0   . traMADol (ULTRAM) 50 MG tablet   Oral   Take 1 tablet (50 mg total) by mouth 2 (two) times daily.   10 tablet   0     Allergies Review of patient's allergies indicates no known allergies.  No family history on file.  Social History Social History  Substance Use Topics  . Smoking status: Current Every Day Smoker  . Smokeless tobacco: None  . Alcohol Use: Yes     Comment: occ.    Review of Systems Constitutional: No fever/chills Eyes: No visual changes. ENT: No sore throat. Dental pain Cardiovascular: Denies chest pain. Respiratory: Denies shortness of breath. Gastrointestinal: No abdominal pain.  No nausea, no vomiting.  No diarrhea.  No constipation. Genitourinary: Negative for dysuria. Musculoskeletal: Negative for back pain. Skin: Negative for rash. Neurological: Negative for headaches, focal weakness or numbness. Endocrine:Diabetes ____________________________________________   PHYSICAL EXAM:  VITAL SIGNS: ED Triage Vitals  Enc Vitals Group     BP 07/31/15 1443  127/94 mmHg     Pulse Rate 07/31/15 1443 95     Resp 07/31/15 1443 16     Temp 07/31/15 1443 98 F (36.7 C)     Temp Source 07/31/15 1443 Oral     SpO2 07/31/15 1443 97 %     Weight 07/31/15 1443 200 lb (90.719 kg)     Height 07/31/15 1443 5\' 5"  (1.651 m)     Head Cir --      Peak Flow --      Pain Score 07/31/15 1443 10     Pain Loc --      Pain Edu? --      Excl. in GC? --     Constitutional: Alert and oriented. Well appearing and in no acute distress. Eyes: Conjunctivae are normal. PERRL. EOMI. Head: Atraumatic. Nose: No congestion/rhinnorhea. Mouth/Throat: Mucous membranes are moist.  Oropharynx non-erythematous. Devitalized tooth #10 and 11. Neck: No  stridor.  No cervical spine tenderness to palpation. Hematological/Lymphatic/Immunilogical: No cervical lymphadenopathy. Cardiovascular: Normal rate, regular rhythm. Grossly normal heart sounds.  Good peripheral circulation. Respiratory: Normal respiratory effort.  No retractions. Lungs CTAB. Gastrointestinal: Soft and nontender. No distention. No abdominal bruits. No CVA tenderness. Musculoskeletal: No lower extremity tenderness nor edema.  No joint effusions. Neurologic:  Normal speech and language. No gross focal neurologic deficits are appreciated. No gait instability. Skin:  Skin is warm, dry and intact. No rash noted. Psychiatric: Mood and affect are normal. Speech and behavior are normal.  ____________________________________________   LABS (all labs ordered are listed, but only abnormal results are displayed)  Labs Reviewed - No data to display ____________________________________________  EKG   ____________________________________________  RADIOLOGY   ____________________________________________   PROCEDURES  Procedure(s) performed: None  Critical Care performed: No  ____________________________________________   INITIAL IMPRESSION / ASSESSMENT AND PLAN / ED COURSE  Pertinent labs & imaging results that were available during my care of the patient were reviewed by me and considered in my medical decision making (see chart for details).  Dental pain secondary to fractured tooth. Patient given discharge Instructions. Patient given a list of dental clinics for follow-up care. Patient given a prescription for amoxicillin, Percocet, and ibuprofen. ____________________________________________   FINAL CLINICAL IMPRESSION(S) / ED DIAGNOSES  Final diagnoses:  Pain due to dental caries  Fractured tooth, closed, initial encounter      Joni ReiningRonald K Karalee Hauter, PA-C 07/31/15 1536  Minna AntisKevin Paduchowski, MD 07/31/15 [phone]

## 2015-07-31 NOTE — ED Notes (Signed)
Dental pain for a few days   No trauma

## 2015-08-06 ENCOUNTER — Emergency Department
Admission: EM | Admit: 2015-08-06 | Discharge: 2015-08-06 | Disposition: A | Discharge status: Home / Self Care | Payer: Medicaid Other | Attending: Student | Admitting: Student

## 2015-08-06 DIAGNOSIS — Z791 Long term (current) use of non-steroidal anti-inflammatories (NSAID): Secondary | ICD-10-CM | POA: Diagnosis not present

## 2015-08-06 DIAGNOSIS — F17210 Nicotine dependence, cigarettes, uncomplicated: Secondary | ICD-10-CM | POA: Insufficient documentation

## 2015-08-06 DIAGNOSIS — K029 Dental caries, unspecified: Secondary | ICD-10-CM | POA: Diagnosis not present

## 2015-08-06 DIAGNOSIS — Z79899 Other long term (current) drug therapy: Secondary | ICD-10-CM | POA: Insufficient documentation

## 2015-08-06 DIAGNOSIS — K0889 Other specified disorders of teeth and supporting structures: Secondary | ICD-10-CM | POA: Diagnosis not present

## 2015-08-06 DIAGNOSIS — E119 Type 2 diabetes mellitus without complications: Secondary | ICD-10-CM | POA: Insufficient documentation

## 2015-08-06 MED ORDER — IBUPROFEN 800 MG PO TABS: 800.0000 mg | ORAL_TABLET | Freq: Three times a day (TID) | ORAL | Status: DC | PRN

## 2015-08-06 MED ORDER — OXYCODONE-ACETAMINOPHEN 5-325 MG PO TABS
1.0000 | ORAL_TABLET | Freq: Once | ORAL | Status: AC
  Administered 2015-08-06: 1 via ORAL

## 2015-08-06 MED ORDER — OXYCODONE-ACETAMINOPHEN 5-325 MG PO TABS
1.0000 | ORAL_TABLET | Freq: Four times a day (QID) | ORAL | Status: DC | PRN
Start: 2015-08-06 — End: 2015-09-22

## 2015-08-06 MED ORDER — OXYCODONE-ACETAMINOPHEN 5-325 MG PO TABS
ORAL_TABLET | ORAL | Status: AC
  Filled 2015-08-06: qty 1

## 2015-08-06 MED ORDER — CEPHALEXIN 500 MG PO CAPS: 500.0000 mg | ORAL_CAPSULE | Freq: Three times a day (TID) | ORAL | Status: DC

## 2015-08-06 NOTE — ED Provider Notes (Signed)
[facility] Emergency Department Provider Note  ____________________________________________  Time seen: Approximately 6:10 PM  I have reviewed the triage vital signs and the nursing notes.   HISTORY  Chief Complaint Dental Pain    HPI Brandi Myers is a 30 y.o. female who was seen last week for the same complaint. She continues to have dental pain right upper incisor region. Has taken antibiotics and Percocet without resolution. His plan is a dentist later this week. No fevers or chills. Pain radiated up towards the right maxillary sinus region. No swelling. No sore throat. No  vomiting.    Past Medical History  Diagnosis Date  . Diabetes mellitus without complication (HCC)     There are no active problems to display for this patient.   Past Surgical History  Procedure Laterality Date  . Cholecystectomy    . Cesarean section      Current Outpatient Rx  Name  Route  Sig  Dispense  Refill  . amoxicillin (AMOXIL) 500 MG capsule   Oral   Take 1 capsule (500 mg total) by mouth 3 (three) times daily.   30 capsule   0   . cephALEXin (KEFLEX) 500 MG capsule   Oral   Take 1 capsule (500 mg total) by mouth 3 (three) times daily.   21 capsule   0   . ibuprofen (ADVIL,MOTRIN) 800 MG tablet   Oral   Take 1 tablet (800 mg total) by mouth every 8 (eight) hours as needed for moderate pain.   15 tablet   0   . ibuprofen (ADVIL,MOTRIN) 800 MG tablet   Oral   Take 1 tablet (800 mg total) by mouth every 8 (eight) hours as needed for moderate pain.   15 tablet   0   . ibuprofen (ADVIL,MOTRIN) 800 MG tablet   Oral   Take 1 tablet (800 mg total) by mouth every 8 (eight) hours as needed.   15 tablet   0   . oxyCODONE-acetaminophen (PERCOCET) 7.5-325 MG tablet   Oral   Take 1 tablet by mouth every 4 (four) hours as needed for severe pain.   20 tablet   0   . oxyCODONE-acetaminophen (PERCOCET) 7.5-325 MG tablet   Oral   Take 1 tablet by mouth  every 6 (six) hours as needed for severe pain.   12 tablet   0   . oxyCODONE-acetaminophen (ROXICET) 5-325 MG tablet   Oral   Take 1 tablet by mouth every 6 (six) hours as needed.   20 tablet   0   . sulfamethoxazole-trimethoprim (BACTRIM DS,SEPTRA DS) 800-160 MG per tablet   Oral   Take 1 tablet by mouth 2 (two) times daily.   20 tablet   0   . sulfamethoxazole-trimethoprim (BACTRIM DS,SEPTRA DS) 800-160 MG tablet   Oral   Take 1 tablet by mouth 2 (two) times daily.   20 tablet   0   . traMADol (ULTRAM) 50 MG tablet   Oral   Take 1 tablet (50 mg total) by mouth 2 (two) times daily.   10 tablet   0     Allergies Review of patient's allergies indicates no known allergies.  No family history on file.  Social History Social History  Substance Use Topics  . Smoking status: Current Every Day Smoker    Types: Cigarettes  . Smokeless tobacco: None  . Alcohol Use: Yes     Comment: occ.    Review of Systems Constitutional: No  fever/chills Eyes: No visual changes. ENT: No sore throat. Cardiovascular: Denies chest pain. Respiratory: Denies shortness of breath. Skin: Negative for rash. Neurological: Negative for  focal weakness or numbness. 10-point ROS otherwise negative.  ____________________________________________   PHYSICAL EXAM:  VITAL SIGNS: ED Triage Vitals  Enc Vitals Group     BP 08/06/15 1740 137/79 mmHg     Pulse Rate 08/06/15 1740 89     Resp 08/06/15 1740 17     Temp 08/06/15 1740 98.5 F (36.9 C)     Temp Source 08/06/15 1740 Oral     SpO2 08/06/15 1740 99 %     Weight 08/06/15 1740 200 lb (90.719 kg)     Height 08/06/15 1740 5\' 6"  (1.676 m)     Head Cir --      Peak Flow --      Pain Score 08/06/15 1741 10     Pain Loc --      Pain Edu? --      Excl. in GC? --     Constitutional: Alert and oriented. Well appearing and in no acute distress. Eyes: Conjunctivae are normal. PERRL. EOMI. Ears:  Clear with normal landmarks. No  erythema. Head: Atraumatic. Nose: No congestion/rhinnorhea. Mouth/Throat: Mucous membranes are moist.  Oropharynx non-erythematous. No lesions. She has one fractured tooth right upper incisor. Several absent dentition. Neck:  Supple.  No adenopathy.   Cardiovascular: Normal rate, regular rhythm. Grossly normal heart sounds.  Good peripheral circulation. Respiratory: Normal respiratory effort.  No retractions. Lungs CTAB. Neurologic:  Normal speech and language. No gross focal neurologic deficits are appreciated. No gait instability. Skin:  Skin is warm, dry and intact. No rash noted. Psychiatric: Mood and affect are normal. Speech and behavior are normal.  ____________________________________________   LABS (all labs ordered are listed, but only abnormal results are displayed)  Labs Reviewed - No data to display ____________________________________________  EKG   ____________________________________________  RADIOLOGY   ____________________________________________   PROCEDURES  Procedure(s) performed: None  Critical Care performed: No  ____________________________________________   INITIAL IMPRESSION / ASSESSMENT AND PLAN / ED COURSE  Pertinent labs & imaging results that were available during my care of the patient were reviewed by me and considered in my medical decision making (see chart for details).  30 year old with persistent pain in the right upper gumline from a fractured tooth, and dental caries. Has been on amoxicillin and Percocet without resolution. She has plans to see the dentist in 2 days. She is given more Percocet and Keflex. ____________________________________________   FINAL CLINICAL IMPRESSION(S) / ED DIAGNOSES  Final diagnoses:  Pain due to dental caries      Ignacia Bayleyobert Mikko Lewellen, PA-C 08/06/15 1813  Gayla DossEryka A Gayle, MD 08/06/15 [phone]

## 2015-08-06 NOTE — ED Notes (Signed)
States she was here last week with dental pain, states right tooth has broken off at the gum..Marland Kitchen

## 2015-08-06 NOTE — Discharge Instructions (Signed)
Dental Caries Dental caries is tooth decay. This decay can cause a hole in teeth (cavity) that can get bigger and deeper over time. HOME CARE  Brush and floss your teeth. Do this at least two times a day.  Use a fluoride toothpaste.  Use a mouth rinse if told by your dentist or doctor.  Eat less sugary and starchy foods. Drink less sugary drinks.  Avoid snacking often on sugary and starchy foods. Avoid sipping often on sugary drinks.  Keep regular checkups and cleanings with your dentist.  Use fluoride supplements if told by your dentist or doctor.  Allow fluoride to be applied to teeth if told by your dentist or doctor.   This information is not intended to replace advice given to you by your health care provider. Make sure you discuss any questions you have with your health care provider.   Document Released: 01/08/2008 Document Revised: 04/21/2014 Document Reviewed: 04/02/2012 Elsevier Interactive Patient Education 2016 Elsevier Inc.  Dental Pain Dental pain may be caused by many things, including:  Tooth decay (cavities or caries). Cavities expose the nerve of your tooth to air and hot or cold temperatures. This can cause pain or discomfort.  Abscess or infection. A dental abscess is a collection of infected pus from a bacterial infection in the inner part of the tooth (pulp). It usually occurs at the end of the tooth's root.  Injury.  An unknown reason (idiopathic). Your pain may be mild or severe. It may only occur when:  You are chewing.  You are exposed to hot or cold temperature.  You are eating or drinking sugary foods or beverages, such as soda or candy. Your pain may also be constant. HOME CARE INSTRUCTIONS Watch your dental pain for any changes. The following actions may help to lessen any discomfort that you are feeling:  Take medicines only as directed by your dentist.  If you were prescribed an antibiotic medicine, finish all of it even if you start to  feel better.  Keep all follow-up visits as directed by your dentist. This is important.  Do not apply heat to the outside of your face.  Rinse your mouth or gargle with salt water if directed by your dentist. This helps with pain and swelling.  You can make salt water by adding  tsp of salt to 1 cup of warm water.  Apply ice to the painful area of your face:  Put ice in a plastic bag.  Place a towel between your skin and the bag.  Leave the ice on for 20 minutes, 2-3 times per day.  Avoid foods or drinks that cause you pain, such as:  Very hot or very cold foods or drinks.  Sweet or sugary foods or drinks. SEEK MEDICAL CARE IF:  Your pain is not controlled with medicines.  Your symptoms are worse.  You have new symptoms. SEEK IMMEDIATE MEDICAL CARE IF:  You are unable to open your mouth.  You are having trouble breathing or swallowing.  You have a fever.  Your face, neck, or jaw is swollen.   This information is not intended to replace advice given to you by your health care provider. Make sure you discuss any questions you have with your health care provider.   Document Released: 03/31/2005 Document Revised: 08/15/2014 Document Reviewed: 03/27/2014 Elsevier Interactive Patient Education 2016 Elsevier Inc.   Take pain medicine as directed. Continue antibiotics. Follow-up with the dentist later this week.

## 2015-08-24 ENCOUNTER — Emergency Department
Admission: EM | Admit: 2015-08-24 | Discharge: 2015-08-24 | Disposition: A | Discharge status: Home / Self Care | Payer: Medicaid Other | Attending: Emergency Medicine | Admitting: Emergency Medicine

## 2015-08-24 ENCOUNTER — Encounter: Payer: Self-pay | Admitting: Emergency Medicine

## 2015-08-24 DIAGNOSIS — F17210 Nicotine dependence, cigarettes, uncomplicated: Secondary | ICD-10-CM | POA: Insufficient documentation

## 2015-08-24 DIAGNOSIS — E119 Type 2 diabetes mellitus without complications: Secondary | ICD-10-CM | POA: Diagnosis not present

## 2015-08-24 DIAGNOSIS — K0889 Other specified disorders of teeth and supporting structures: Secondary | ICD-10-CM | POA: Diagnosis present

## 2015-08-24 DIAGNOSIS — Z79899 Other long term (current) drug therapy: Secondary | ICD-10-CM | POA: Diagnosis not present

## 2015-08-24 DIAGNOSIS — Z791 Long term (current) use of non-steroidal anti-inflammatories (NSAID): Secondary | ICD-10-CM | POA: Insufficient documentation

## 2015-08-24 NOTE — ED Provider Notes (Signed)
[facility] Emergency Department Provider Note  ____________________________________________  Time seen: Approximately 2:07 PM  I have reviewed the triage vital signs and the nursing notes.   HISTORY  Chief Complaint Dental Pain    HPI Brandi Myers is a 30 y.o. female who presents emergency department complaining of left lower dental pain. Patient states that she has bad dentition and has a recent tooth fracture to the left lower dentition. The patient reports pain to the area. She denies any swelling. She denies any difficulty breathing or swallowing. No fevers or chills, chest pain, shortness breath, nausea or vomiting.   Past Medical History  Diagnosis Date  . Diabetes mellitus without complication (HCC)     There are no active problems to display for this patient.   Past Surgical History  Procedure Laterality Date  . Cholecystectomy    . Cesarean section      Current Outpatient Rx  Name  Route  Sig  Dispense  Refill  . amoxicillin (AMOXIL) 500 MG capsule   Oral   Take 1 capsule (500 mg total) by mouth 3 (three) times daily.   30 capsule   0   . cephALEXin (KEFLEX) 500 MG capsule   Oral   Take 1 capsule (500 mg total) by mouth 3 (three) times daily.   21 capsule   0   . ibuprofen (ADVIL,MOTRIN) 800 MG tablet   Oral   Take 1 tablet (800 mg total) by mouth every 8 (eight) hours as needed for moderate pain.   15 tablet   0   . ibuprofen (ADVIL,MOTRIN) 800 MG tablet   Oral   Take 1 tablet (800 mg total) by mouth every 8 (eight) hours as needed for moderate pain.   15 tablet   0   . ibuprofen (ADVIL,MOTRIN) 800 MG tablet   Oral   Take 1 tablet (800 mg total) by mouth every 8 (eight) hours as needed.   15 tablet   0   . oxyCODONE-acetaminophen (PERCOCET) 7.5-325 MG tablet   Oral   Take 1 tablet by mouth every 4 (four) hours as needed for severe pain.   20 tablet   0   . oxyCODONE-acetaminophen (PERCOCET) 7.5-325 MG  tablet   Oral   Take 1 tablet by mouth every 6 (six) hours as needed for severe pain.   12 tablet   0   . oxyCODONE-acetaminophen (ROXICET) 5-325 MG tablet   Oral   Take 1 tablet by mouth every 6 (six) hours as needed.   20 tablet   0   . sulfamethoxazole-trimethoprim (BACTRIM DS,SEPTRA DS) 800-160 MG per tablet   Oral   Take 1 tablet by mouth 2 (two) times daily.   20 tablet   0   . sulfamethoxazole-trimethoprim (BACTRIM DS,SEPTRA DS) 800-160 MG tablet   Oral   Take 1 tablet by mouth 2 (two) times daily.   20 tablet   0   . traMADol (ULTRAM) 50 MG tablet   Oral   Take 1 tablet (50 mg total) by mouth 2 (two) times daily.   10 tablet   0     Allergies Norco  No family history on file.  Social History Social History  Substance Use Topics  . Smoking status: Current Every Day Smoker    Types: Cigarettes  . Smokeless tobacco: None  . Alcohol Use: Yes     Comment: occ.     Review of Systems  Constitutional: No fever/chills ENT: Positive for  left lower dental pain. Cardiovascular: no chest pain. Respiratory: no cough. No SOB. Gastrointestinal: No abdominal pain.  No nausea, no vomiting.   Musculoskeletal: Negative for musculoskeletal pain. Skin: Negative for rash, abrasions, lacerations, ecchymosis. Neurological: Negative for headaches, focal weakness or numbness. 10-point ROS otherwise negative.  ____________________________________________   PHYSICAL EXAM:  VITAL SIGNS: ED Triage Vitals  Enc Vitals Group     BP 08/24/15 1352 121/78 mmHg     Pulse Rate 08/24/15 1352 90     Resp 08/24/15 1352 18     Temp 08/24/15 1352 98.1 F (36.7 C)     Temp Source 08/24/15 1352 Oral     SpO2 08/24/15 1352 98 %     Weight 08/24/15 1405 200 lb (90.719 kg)     Height 08/24/15 1405 5\' 5"  (1.651 m)     Head Cir --      Peak Flow --      Pain Score 08/24/15 1344 10     Pain Loc --      Pain Edu? --      Excl. in GC? --      Constitutional: Alert and oriented.  Well appearing and in no acute distress. Eyes: Conjunctivae are normal. PERRL. EOMI. Head: Atraumatic. ENT:      Ears:       Nose: No congestion/rhinnorhea.      Mouth/Throat: Mucous membranes are moist. Poor dentition throughout with multiple caries and fractures. Patient is complaining of tooth #22 pain. Patient states that there was a recent dental fracture however examination reveals smooth edges to broken tooth as well caries overlying fracture line. No surrounding erythema or edema. No signs of infection. Neck: No stridor.  Hematological/Lymphatic/Immunilogical: No cervical lymphadenopathy. Cardiovascular: Normal rate, regular rhythm. Normal S1 and S2.  Good peripheral circulation. Respiratory: Normal respiratory effort without tachypnea or retractions. Lungs CTAB. Good air entry to the bases with no decreased or absent breath sounds. Musculoskeletal: Full range of motion to all extremities. No gross deformities appreciated. Neurologic:  Normal speech and language. No gross focal neurologic deficits are appreciated.  Skin:  Skin is warm, dry and intact. No rash noted. Psychiatric: Mood and affect are normal. Speech and behavior are normal. Patient exhibits appropriate insight and judgement.   ____________________________________________   LABS (all labs ordered are listed, but only abnormal results are displayed)  Labs Reviewed - No data to display ____________________________________________  EKG   ____________________________________________  RADIOLOGY   No results found.  ____________________________________________    PROCEDURES  Procedure(s) performed:       Medications - No data to display   ____________________________________________   INITIAL IMPRESSION / ASSESSMENT AND PLAN / ED COURSE  Pertinent labs & imaging results that were available during my care of the patient were reviewed by me and considered in my medical decision making (see chart for  details).  Patient's diagnosis is consistent with dental pain. Patient reports fracture of the tooth however examination reveals that this tooth has significant signs of decay from previous fracture. Patient is offered a dental block here in the emergency department and declined. Patient is advised that she can take Tylenol and Motrin at home and patient became very upset with the provider. Patient was requesting narcotic pain medication. After provider refused to provide narcotics, patient became even further upset and states "I am leaving and going to [facility] where they will prescribe me narcotics.". Patient left the room prior to discharge paperwork being provided.     ____________________________________________  FINAL  CLINICAL IMPRESSION(S) / ED DIAGNOSES  Final diagnoses:  Pain, dental      NEW MEDICATIONS STARTED DURING THIS VISIT:  New Prescriptions   No medications on file        This chart was dictated using voice recognition software/Dragon. Despite best efforts to proofread, errors can occur which can change the meaning. Any change was purely unintentional.    Racheal Patches, PA-C 08/24/15 1427  Jene Every, MD 08/24/15 Ernestina Columbia

## 2015-08-24 NOTE — ED Notes (Signed)
Patient left without receiving paperwork.  Patient heard walking in the hall upset that the PA offered a dental block but would not give her any pain medications for her dental pain.

## 2015-08-24 NOTE — Discharge Instructions (Signed)
Dental Pain Dental pain may be caused by many things, including:  Tooth decay (cavities or caries). Cavities expose the nerve of your tooth to air and hot or cold temperatures. This can cause pain or discomfort.  Abscess or infection. A dental abscess is a collection of infected pus from a bacterial infection in the inner part of the tooth (pulp). It usually occurs at the end of the tooth's root.  Injury.  An unknown reason (idiopathic). Your pain may be mild or severe. It may only occur when:  You are chewing.  You are exposed to hot or cold temperature.  You are eating or drinking sugary foods or beverages, such as soda or candy. Your pain may also be constant. HOME CARE INSTRUCTIONS Watch your dental pain for any changes. The following actions may help to lessen any discomfort that you are feeling:  Take medicines only as directed by your dentist.  If you were prescribed an antibiotic medicine, finish all of it even if you start to feel better.  Keep all follow-up visits as directed by your dentist. This is important.  Do not apply heat to the outside of your face.  Rinse your mouth or gargle with salt water if directed by your dentist. This helps with pain and swelling.  You can make salt water by adding  tsp of salt to 1 cup of warm water.  Apply ice to the painful area of your face:  Put ice in a plastic bag.  Place a towel between your skin and the bag.  Leave the ice on for 20 minutes, 2-3 times per day.  Avoid foods or drinks that cause you pain, such as:  Very hot or very cold foods or drinks.  Sweet or sugary foods or drinks. SEEK MEDICAL CARE IF:  Your pain is not controlled with medicines.  Your symptoms are worse.  You have new symptoms. SEEK IMMEDIATE MEDICAL CARE IF:  You are unable to open your mouth.  You are having trouble breathing or swallowing.  You have a fever.  Your face, neck, or jaw is swollen.   This information is not  intended to replace advice given to you by your health care provider. Make sure you discuss any questions you have with your health care provider.   Document Released: 03/31/2005 Document Revised: 08/15/2014 Document Reviewed: 03/27/2014 Elsevier Interactive Patient Education 2016 [employer].   OPTIONS FOR DENTAL FOLLOW UP CARE  [place] Department of Health and Human Services - Local Safety Net Dental Clinics [email or web address]   [facility] [phone])  Sharl Ma [phone])  [place] [phone] ext [phone])  [facility] [phone])  [facility] [phone]) This clinic caters to the indigent population and is on a lottery system. Location: [employer] [facility], [employer], [address] [place] Clinic Hours: Wednesdays from 6pm - 9pm, patients seen by a lottery system. For dates, call or go to [email or web address] Services: Cleanings, fillings and simple extractions. Payment Options: DENTAL WORK IS FREE OF CHARGE. Bring proof of income or support. Best way to get seen: Arrive at 5:15 pm - this is a lottery, NOT first come/first serve, so arriving earlier will not increase your chances of being seen.     [facility] [phone] Select option 1 for emergencies   Location: [facility], [place], [address], [place] Clinic Hours: No walk-ins accepted - call the day before to schedule an appointment.  Check in times are 9:30 am and 1:30 pm. Services: Simple extractions, temporary fillings, pulpectomy/pulp debridement, uncomplicated abscess drainage. Payment Options: PAYMENT IS DUE AT THE TIME OF SERVICE.  Fee is usually $100-200, additional surgical procedures (e.g. abscess drainage) may be extra. Cash, checks, Visa/MasterCard accepted.  Can file Medicaid if patient is  covered for dental - patient should call case worker to check. No discount for [facility] patients. Best way to get seen: MUST call the day before and get onto the schedule. Can usually be seen the next 1-2 days. No walk-ins accepted.     [facility] [phone]   Location: [facility], [address], [place] Clinic Hours: M, W, Th, F 8am or 1:30pm, Tues 9a or 1:30 - first come/first served. Services: Simple extractions, temporary fillings, uncomplicated abscess drainage.  You do not need to be an [place] resident. Payment Options: PAYMENT IS DUE AT THE TIME OF SERVICE. Dental insurance, otherwise sliding scale - bring proof of income or support. Depending on income and treatment needed, cost is usually $50-200. Best way to get seen: Arrive early as it is first come/first served.     [facility] [phone]   Location: [address] Clinic Hours: Mon-Thu 8a-5p Services: Most basic dental services including extractions and fillings. Payment Options: PAYMENT IS DUE AT THE TIME OF SERVICE. Sliding scale, up to 50% off - bring proof if income or support. Medicaid with dental option accepted. Best way to get seen: Call to schedule an appointment, can usually be seen within 2 weeks OR they will try to see walk-ins - show up at 8a or 2p (you may have to wait).     [facility] [phone] [place] RESIDENTS ONLY   Location: [facility], [address], [place], [place] [id number] Clinic Hours: By appointment only. Monday - Thursday 8am-5pm, Friday 8am-12pm Services: Cleanings, fillings, extractions. Payment Options: PAYMENT IS DUE AT THE TIME OF SERVICE. Cash, Visa or MasterCard. Sliding scale - $30 minimum per service. Best way to get seen: Come in to office, complete packet and make an appointment - need proof of income or support  monies for each household member and proof of [place] residence. Usually takes about a month to get in.     [facility] [phone]   Location: [address] [facility] Hours: Walk-in Urgent Care Dental Services are offered Monday-Friday mornings only. The numbers of emergencies accepted daily is limited to the number of providers available. Maximum 15 - Mondays, Wednesdays & Thursdays Maximum 10 - Tuesdays & Fridays Services: You do not need to be [place] resident to be seen for a dental emergency. Emergencies are defined as pain, swelling, abnormal bleeding, or dental trauma. Walkins will receive x-rays if needed. NOTE: Dental cleaning is not an emergency. Payment Options: PAYMENT IS DUE AT THE TIME OF SERVICE. Minimum co-pay is $40.00 for uninsured patients. Minimum co-pay is $3.00 for Medicaid with dental coverage. Dental Insurance is accepted and must be presented at time of visit. Medicare does not cover dental. Forms of payment: Cash, credit card, checks. Best way to get seen: If not previously registered with the clinic, walk-in dental registration begins at 7:15 am and is on a first come/first serve basis. If previously registered with the clinic, call to make an appointment.     [facility] [phone] [place] RESIDENTS ONLY   Location: [address], [place], [place] Clinic Hours: Mon-Thu 10a-2p Services: Extractions only! Payment Options: FREE (donations accepted) - bring proof of income or support Best way to get seen: Call and schedule an appointment OR come at 8am on the 1st Monday of every month (except for holidays) when it is first come/first served.     [facility] [phone]   Location: [address] [place], [place] Clinic Hours: Friday mornings Services, Payment Options, Best way to get seen: Call for info

## 2015-08-24 NOTE — ED Notes (Signed)
C/o toothache.  No resp distress 

## 2015-09-22 ENCOUNTER — Emergency Department
Admission: EM | Admit: 2015-09-22 | Discharge: 2015-09-22 | Disposition: A | Discharge status: Home / Self Care | Payer: Medicaid Other | Attending: Emergency Medicine | Admitting: Emergency Medicine

## 2015-09-22 DIAGNOSIS — Y939 Activity, unspecified: Secondary | ICD-10-CM | POA: Diagnosis not present

## 2015-09-22 DIAGNOSIS — L03211 Cellulitis of face: Secondary | ICD-10-CM | POA: Diagnosis not present

## 2015-09-22 DIAGNOSIS — F17210 Nicotine dependence, cigarettes, uncomplicated: Secondary | ICD-10-CM | POA: Diagnosis not present

## 2015-09-22 DIAGNOSIS — Y999 Unspecified external cause status: Secondary | ICD-10-CM | POA: Diagnosis not present

## 2015-09-22 DIAGNOSIS — S0086XA Insect bite (nonvenomous) of other part of head, initial encounter: Secondary | ICD-10-CM | POA: Diagnosis present

## 2015-09-22 DIAGNOSIS — E119 Type 2 diabetes mellitus without complications: Secondary | ICD-10-CM | POA: Insufficient documentation

## 2015-09-22 DIAGNOSIS — W57XXXA Bitten or stung by nonvenomous insect and other nonvenomous arthropods, initial encounter: Secondary | ICD-10-CM | POA: Diagnosis not present

## 2015-09-22 DIAGNOSIS — Y929 Unspecified place or not applicable: Secondary | ICD-10-CM | POA: Diagnosis not present

## 2015-09-22 MED ORDER — IBUPROFEN 800 MG PO TABS: 800.0000 mg | ORAL_TABLET | Freq: Three times a day (TID) | ORAL | Status: DC | PRN

## 2015-09-22 MED ORDER — OXYCODONE-ACETAMINOPHEN 5-325 MG PO TABS
ORAL_TABLET | ORAL | Status: AC
  Administered 2015-09-22: 1 via ORAL
  Filled 2015-09-22: qty 1

## 2015-09-22 MED ORDER — OXYCODONE-ACETAMINOPHEN 5-325 MG PO TABS: 1.0000 | ORAL_TABLET | ORAL | Status: DC | PRN

## 2015-09-22 MED ORDER — SULFAMETHOXAZOLE-TRIMETHOPRIM 800-160 MG PO TABS: 1.0000 | ORAL_TABLET | Freq: Two times a day (BID) | ORAL | Status: DC

## 2015-09-22 MED ORDER — OXYCODONE-ACETAMINOPHEN 5-325 MG PO TABS
1.0000 | ORAL_TABLET | Freq: Once | ORAL | Status: AC
  Administered 2015-09-22: 1 via ORAL

## 2015-09-22 NOTE — ED Notes (Signed)
Pt reports abscess on chin and left dental pain and left ear pain for past couple of days. Unknown fever

## 2015-09-22 NOTE — Discharge Instructions (Signed)

## 2015-09-22 NOTE — ED Provider Notes (Signed)
[facility] Emergency Department Provider Note  ____________________________________________  Time seen: Approximately 12:41 PM  I have reviewed the triage vital signs and the nursing notes.   HISTORY  Chief Complaint Dental Pain    HPI Brandi Myers is a 30 y.o. female presents for evaluation with an abscess on her chin and pain radiating up the left side of her cheek and ear for the last few days. She reports that she's got dust appointment in 3 days but is concerned about the chin swelling. Patient says that she got originally by something and then tried to squeeze it.   Past Medical History  Diagnosis Date  . Diabetes mellitus without complication (HCC)     There are no active problems to display for this patient.   Past Surgical History  Procedure Laterality Date  . Cholecystectomy    . Cesarean section      Current Outpatient Rx  Name  Route  Sig  Dispense  Refill  . ibuprofen (ADVIL,MOTRIN) 800 MG tablet   Oral   Take 1 tablet (800 mg total) by mouth every 8 (eight) hours as needed.   30 tablet   0   . oxyCODONE-acetaminophen (ROXICET) 5-325 MG tablet   Oral   Take 1-2 tablets by mouth every 4 (four) hours as needed for severe pain.   15 tablet   0   . sulfamethoxazole-trimethoprim (BACTRIM DS,SEPTRA DS) 800-160 MG tablet   Oral   Take 1 tablet by mouth 2 (two) times daily.   20 tablet   0     Allergies Norco  No family history on file.  Social History Social History  Substance Use Topics  . Smoking status: Current Every Day Smoker    Types: Cigarettes  . Smokeless tobacco: Not on file  . Alcohol Use: Yes     Comment: occ.    Review of Systems Constitutional: No fever/chills ENT: No sore throat. Cardiovascular: Denies chest pain. Respiratory: Denies shortness of breath. Musculoskeletal: Negative for back pain. Skin: Positive for lesion erythema redness and tenderness to the chin. Neurological: Negative for  headaches, focal weakness or numbness.  10-point ROS otherwise negative.  ____________________________________________   PHYSICAL EXAM:  VITAL SIGNS: ED Triage Vitals  Enc Vitals Group     BP 09/22/15 1219 154/101 mmHg     Pulse Rate 09/22/15 1219 101     Resp 09/22/15 1219 20     Temp 09/22/15 1219 98.1 F (36.7 C)     Temp Source 09/22/15 1219 Oral     SpO2 09/22/15 1219 100 %     Weight 09/22/15 1219 200 lb (90.719 kg)     Height 09/22/15 1219  (1.651 m)     Head Cir --      Peak Flow --      Pain Score 09/22/15 1220 10     Pain Loc --      Pain Edu? --      Excl. in GC? --     Constitutional: Alert and oriented. Well appearing and in no acute distress. Eyes: Conjunctivae are normal. PERRL. EOMI. Head: Atraumatic.Chin line with a 2 cm area of erythema and firmness. Positive tenderness radiating up to the left ear. TMs unremarkable. Nose: No congestion/rhinnorhea. Mouth/Throat: Mucous membranes are moist.  Oropharynx non-erythematous. Neck: No stridor.  No cervical adenopathy. Cardiovascular: Normal rate, regular rhythm. Grossly normal heart sounds.  Good peripheral circulation. Respiratory: Normal respiratory effort.  No retractions. Lungs CTAB. Musculoskeletal: No lower  extremity tenderness nor edema.  No joint effusions. Neurologic:  Normal speech and language. No gross focal neurologic deficits are appreciated. No gait instability. Skin:  Skin is warm, dry and intact. No rash noted. Psychiatric: Mood and affect are normal. Speech and behavior are normal.  ____________________________________________   LABS (all labs ordered are listed, but only abnormal results are displayed)  Labs Reviewed - No data to display ____________________________________________    PROCEDURES  Procedure(s) performed: None  Critical Care performed: No  ____________________________________________   INITIAL IMPRESSION / ASSESSMENT AND PLAN / ED COURSE  Pertinent labs &  imaging results that were available during my care of the patient were reviewed by me and considered in my medical decision making (see chart for details).  Insect bite with cellulitis of the left jaw. Rx given for Bactrim DS twice a day #20, Percocet 5/325, Motrin 800 mg. Patient to keep her dental appointment in 2 days to 3 days. Follow up as needed. ____________________________________________   FINAL CLINICAL IMPRESSION(S) / ED DIAGNOSES  Final diagnoses:  Insect bite  Cellulitis of jaw, left     This chart was dictated using voice recognition software/Dragon. Despite best efforts to proofread, errors can occur which can change the meaning. Any change was purely unintentional.   Evangeline Dakinharles M Beers, PA-C 09/22/15 1302  Minna AntisKevin Paduchowski, MD 09/22/15 1453

## 2015-11-02 ENCOUNTER — Emergency Department
Admission: EM | Admit: 2015-11-02 | Discharge: 2015-11-02 | Disposition: A | Discharge status: Home / Self Care | Payer: Medicaid Other | Attending: Emergency Medicine | Admitting: Emergency Medicine

## 2015-11-02 ENCOUNTER — Encounter: Payer: Self-pay | Admitting: Medical Oncology

## 2015-11-02 DIAGNOSIS — F17210 Nicotine dependence, cigarettes, uncomplicated: Secondary | ICD-10-CM | POA: Diagnosis not present

## 2015-11-02 DIAGNOSIS — Y999 Unspecified external cause status: Secondary | ICD-10-CM | POA: Diagnosis not present

## 2015-11-02 DIAGNOSIS — W57XXXA Bitten or stung by nonvenomous insect and other nonvenomous arthropods, initial encounter: Secondary | ICD-10-CM | POA: Diagnosis not present

## 2015-11-02 DIAGNOSIS — L02211 Cutaneous abscess of abdominal wall: Secondary | ICD-10-CM | POA: Diagnosis not present

## 2015-11-02 DIAGNOSIS — S30861A Insect bite (nonvenomous) of abdominal wall, initial encounter: Secondary | ICD-10-CM | POA: Insufficient documentation

## 2015-11-02 DIAGNOSIS — E119 Type 2 diabetes mellitus without complications: Secondary | ICD-10-CM | POA: Insufficient documentation

## 2015-11-02 DIAGNOSIS — Y939 Activity, unspecified: Secondary | ICD-10-CM | POA: Diagnosis not present

## 2015-11-02 DIAGNOSIS — L089 Local infection of the skin and subcutaneous tissue, unspecified: Secondary | ICD-10-CM

## 2015-11-02 DIAGNOSIS — Y929 Unspecified place or not applicable: Secondary | ICD-10-CM | POA: Diagnosis not present

## 2015-11-02 MED ORDER — SULFAMETHOXAZOLE-TRIMETHOPRIM 800-160 MG PO TABS: 1.0000 | ORAL_TABLET | Freq: Two times a day (BID) | ORAL | Status: DC

## 2015-11-02 MED ORDER — IBUPROFEN 600 MG PO TABS: 600.0000 mg | ORAL_TABLET | Freq: Four times a day (QID) | ORAL | Status: DC | PRN

## 2015-11-02 NOTE — ED Provider Notes (Signed)
[facility] Emergency Department Provider Note  ____________________________________________  Time seen: Approximately 11:49 AM  I have reviewed the triage vital signs and the nursing notes.   HISTORY  Chief Complaint Insect Bite and Abscess    HPI Brandi Myers is a 30 y.o. female , NAD, presents to the emergency department with 3 day history of skin sore to her lower abdomen. States she may have been bitten by a spider but is uncertain. Has noted some clear to yellow drainage from the area which has recently stopped. It has been significantly red and tender to touch. Feels like the redness is spreading downward towards her underwear line. Denies any fevers, chills, body aches. Has not had any abdominal pain, nausea, vomiting.    Past Medical History  Diagnosis Date  . Diabetes mellitus without complication (HCC)     There are no active problems to display for this patient.   Past Surgical History  Procedure Laterality Date  . Cholecystectomy    . Cesarean section      Current Outpatient Rx  Name  Route  Sig  Dispense  Refill  . ibuprofen (ADVIL,MOTRIN) 600 MG tablet   Oral   Take 1 tablet (600 mg total) by mouth every 6 (six) hours as needed.   30 tablet   0   . sulfamethoxazole-trimethoprim (BACTRIM DS,SEPTRA DS) 800-160 MG tablet   Oral   Take 1 tablet by mouth 2 (two) times daily.   20 tablet   0     Allergies Norco  No family history on file.  Social History Social History  Substance Use Topics  . Smoking status: Current Every Day Smoker    Types: Cigarettes  . Smokeless tobacco: None  . Alcohol Use: Yes     Comment: occ.     Review of Systems  Constitutional: No fever/chills, fatigue Cardiovascular: No chest pain. Respiratory:  No shortness of breath. No wheezing.  Gastrointestinal: No abdominal pain.  No nausea, vomiting.  Musculoskeletal: Negative for General myalgias.  Skin: Positive skin sore abdomen with  surrounding redness. Negative for rash, open wounds or lacerations. Neurological: Negative for headaches, focal weakness or numbness. No tingling.  10-point ROS otherwise negative.  ____________________________________________   PHYSICAL EXAM:  VITAL SIGNS: ED Triage Vitals  Enc Vitals Group     BP 11/02/15 1125 134/95 mmHg     Pulse Rate 11/02/15 1125 100     Resp 11/02/15 1125 17     Temp 11/02/15 1125 98.3 F (36.8 C)     Temp Source 11/02/15 1125 Oral     SpO2 11/02/15 1125 100 %     Weight 11/02/15 1125 190 lb (86.183 kg)     Height 11/02/15 1125  (1.651 m)     Head Cir --      Peak Flow --      Pain Score 11/02/15 1126 8     Pain Loc --      Pain Edu? --      Excl. in GC? --      Constitutional: Alert and oriented. Well appearing and in no acute distress. Eyes: Conjunctivae are normal.  Head: Atraumatic. Neck: Supple with full range of motion Hematological/Lymphatic/Immunilogical: No cervical lymphadenopathy. Cardiovascular: Normal rate, regular rhythm. Normal S1 and S2.  Good peripheral circulation. Respiratory: Normal respiratory effort without tachypnea or retractions. Lungs CTABWith breath sounds noted in all lung fields. Gastrointestinal: Soft and nontenderWithout distention in all quadrants. Neurologic:  Normal speech and language. No  gross focal neurologic deficits are appreciated.  Skin:  Area of erythema of with centralized 0.5cm area of induration with a central yellow scab lesion noted about the lower abdomen. Area is significantly tender to palpation. No fluctuance is noted. No active oozing or weeping. Skin is warm, dry and intact. No rash noted. Psychiatric: Mood and affect are normal. Speech and behavior are normal. Patient exhibits appropriate insight and judgement.   ____________________________________________    LABS  None ____________________________________________  EKG  None ____________________________________________  RADIOLOGY  None ____________________________________________    PROCEDURES  Procedure(s) performed: None   Medications - No data to display   ____________________________________________   INITIAL IMPRESSION / ASSESSMENT AND PLAN / ED COURSE  Patient's diagnosis is consistent with abscess abdominal wall, infected insect bite. Patient will be discharged home with prescriptions for Bactrim DS and ibuprofen to take as directed. Patient may apply warm compresses to the affected area 20 minutes 3-4 times daily as needed. Patient is to follow up with her primary care provider in 3 days if symptoms persist past this treatment course. Patient is given ED precautions to return to the ED for any worsening or new symptoms.    ____________________________________________  FINAL CLINICAL IMPRESSION(S) / ED DIAGNOSES  Final diagnoses:  Abdominal wall abscess  Infected insect bite of abdomen, initial encounter      NEW MEDICATIONS STARTED DURING THIS VISIT:  Discharge Medication List as of 11/02/2015 11:54 AM           Hope PigeonJami L Shelsey Rieth, PA-C 11/02/15 1528  Jennye MoccasinBrian S Quigley, MD 11/02/15 1540

## 2015-11-02 NOTE — Discharge Instructions (Signed)

## 2015-11-02 NOTE — ED Notes (Signed)
Pt noticed 3 days ago a bump that was draining under abdominal fold. Pt states increasing pain since then it redness.  Area is red and tender to touch.  No drainage currently. Pain is near underwear line also.

## 2015-11-02 NOTE — ED Notes (Signed)
Spider bite/abscess to abdomen

## 2015-11-21 ENCOUNTER — Encounter: Payer: Self-pay | Admitting: *Deleted

## 2015-11-21 ENCOUNTER — Emergency Department
Admission: EM | Admit: 2015-11-21 | Discharge: 2015-11-21 | Disposition: A | Discharge status: Home / Self Care | Payer: Medicaid Other | Attending: Emergency Medicine | Admitting: Emergency Medicine

## 2015-11-21 DIAGNOSIS — L02211 Cutaneous abscess of abdominal wall: Secondary | ICD-10-CM | POA: Insufficient documentation

## 2015-11-21 DIAGNOSIS — E119 Type 2 diabetes mellitus without complications: Secondary | ICD-10-CM | POA: Diagnosis not present

## 2015-11-21 DIAGNOSIS — F17210 Nicotine dependence, cigarettes, uncomplicated: Secondary | ICD-10-CM | POA: Insufficient documentation

## 2015-11-21 DIAGNOSIS — Z791 Long term (current) use of non-steroidal anti-inflammatories (NSAID): Secondary | ICD-10-CM | POA: Diagnosis not present

## 2015-11-21 DIAGNOSIS — L0291 Cutaneous abscess, unspecified: Secondary | ICD-10-CM

## 2015-11-21 LAB — CBC WITH DIFFERENTIAL/PLATELET
Basophils Absolute: 0.1 10*3/uL (ref 0–0.1)
Basophils Relative: 1 %
Eosinophils Absolute: 0.3 10*3/uL (ref 0–0.7)
Eosinophils Relative: 2 %
HEMATOCRIT: 40.9 % (ref 35.0–47.0)
HEMOGLOBIN: 13.6 g/dL (ref 12.0–16.0)
LYMPHS ABS: 2.6 10*3/uL (ref 1.0–3.6)
LYMPHS PCT: 19 %
MCH: 27.2 pg (ref 26.0–34.0)
MCHC: 33.3 g/dL (ref 32.0–36.0)
MCV: 81.8 fL (ref 80.0–100.0)
MONOS PCT: 8 %
Monocytes Absolute: 1.1 10*3/uL — ABNORMAL HIGH (ref 0.2–0.9)
NEUTROS PCT: 70 %
Neutro Abs: 9.6 10*3/uL — ABNORMAL HIGH (ref 1.4–6.5)
Platelets: 242 10*3/uL (ref 150–440)
RBC: 5 MIL/uL (ref 3.80–5.20)
RDW: 17 % — ABNORMAL HIGH (ref 11.5–14.5)
WBC: 13.8 10*3/uL — AB (ref 3.6–11.0)

## 2015-11-21 LAB — BASIC METABOLIC PANEL
Anion gap: 10 (ref 5–15)
BUN: <5 mg/dL — ABNORMAL LOW (ref 6–20)
CHLORIDE: 100 mmol/L — AB (ref 101–111)
CO2: 23 mmol/L (ref 22–32)
Calcium: 9.1 mg/dL (ref 8.9–10.3)
Creatinine, Ser: 0.62 mg/dL (ref 0.44–1.00)
GFR calc non Af Amer: >60 mL/min (ref >60)
Glucose, Bld: 327 mg/dL — ABNORMAL HIGH (ref 65–99)
POTASSIUM: 4.5 mmol/L (ref 3.5–5.1)
SODIUM: 133 mmol/L — AB (ref 135–145)

## 2015-11-21 MED ORDER — OXYCODONE-ACETAMINOPHEN 5-325 MG PO TABS: 1.0000 | ORAL_TABLET | ORAL | 0 refills | Status: DC | PRN

## 2015-11-21 MED ORDER — CLINDAMYCIN HCL 300 MG PO CAPS: 300.0000 mg | ORAL_CAPSULE | Freq: Three times a day (TID) | ORAL | 0 refills | Status: DC

## 2015-11-21 MED ORDER — OXYCODONE-ACETAMINOPHEN 5-325 MG PO TABS
1.0000 | ORAL_TABLET | Freq: Once | ORAL | Status: AC
  Administered 2015-11-21: 1 via ORAL

## 2015-11-21 NOTE — ED Notes (Signed)
Pt signed esignature.  D/c  inst to pt.  

## 2015-11-21 NOTE — ED Triage Notes (Addendum)
Pt arrives with redness and hardness to right lower abd, states she was seen in ED and given abx  And told it was a bite recently but states the area has grown in pain and size

## 2015-11-21 NOTE — ED Provider Notes (Signed)
[facility] Emergency Department Provider Note  ____________________________________________  Time seen: Approximately 3:40 PM  I have reviewed the triage vital signs and the nursing notes.   HISTORY  Chief Complaint Abscess    HPI PRICSILLA LINDVALL is a 30 y.o. female presents for evaluation of an abscess to her lower abdomen. Patient states that she was seen here 3 weeks ago for same given a prescription for Bactrim DS which initially helped but has since returned large of them before. Complains of severe pain. Patient is a diabetic non-insulin-dependent. Describes pain as 10 over 10 at the time this rubs her quality of life secondary to being unable to lie on her right side of her stomach.   Past Medical History:  Diagnosis Date  . Diabetes mellitus without complication (HCC)     There are no active problems to display for this patient.   Past Surgical History:  Procedure Laterality Date  . CESAREAN SECTION    . CHOLECYSTECTOMY      Prior to Admission medications   Medication Sig Start Date End Date Taking? Authorizing Provider  clindamycin (CLEOCIN) 300 MG capsule Take 1 capsule (300 mg total) by mouth 3 (three) times daily. 11/21/15   Charmayne Sheer Bohdi Leeds, PA-C  ibuprofen (ADVIL,MOTRIN) 600 MG tablet Take 1 tablet (600 mg total) by mouth every 6 (six) hours as needed. 11/02/15   Jami L Hagler, PA-C  oxyCODONE-acetaminophen (ROXICET) 5-325 MG tablet Take 1-2 tablets by mouth every 4 (four) hours as needed for severe pain. 11/21/15   Charmayne Sheer [place]h Yeagley, PA-C  sulfamethoxazole-trimethoprim (BACTRIM DS,SEPTRA DS) 800-160 MG tablet Take 1 tablet by mouth 2 (two) times daily. 11/02/15   Jami L Hagler, PA-C    Allergies Norco [hydrocodone-acetaminophen]  History reviewed. No pertinent family history.  Social History Social History  Substance Use Topics  . Smoking status: Current Every Day Smoker    Types: Cigarettes  . Smokeless tobacco: Not on file  .  Alcohol use Yes     Comment: occ.    Review of Systems Constitutional: No fever/chills Cardiovascular: Denies chest pain. Respiratory: Denies shortness of breath. Gastrointestinal: No abdominal pain.  No nausea, no vomiting.  No diarrhea.  No constipation. Musculoskeletal: Negative for back pain. Skin: Positive for abscess to the lower abdomen. Neurological: Negative for headaches, focal weakness or numbness.  10-point ROS otherwise negative.  ____________________________________________   PHYSICAL EXAM:  VITAL SIGNS: ED Triage Vitals [11/21/15 1355]  Enc Vitals Group     BP 126/86     Pulse Rate (!) 109     Resp 18     Temp 98.3 F (36.8 C)     Temp Source Oral     SpO2 100 %     Weight 180 lb (81.6 kg)     Height  (1.651 m)     Head Circumference      Peak Flow      Pain Score 9     Pain Loc      Pain Edu?      Excl. in GC?     Constitutional: Alert and oriented. Well appearing and in no acute distress.  Cardiovascular: Normal rate, regular rhythm. Grossly normal heart sounds.  Good peripheral circulation. Respiratory: Normal respiratory effort.  No retractions. Lungs CTAB. Gastrointestinal: 10 x 7 cm center of firmness and 20 x 11 cm area of erythema. Tender to palpation, warm to touch. Musculoskeletal: No lower extremity tenderness nor edema.  No joint effusions. Neurologic:  Normal speech and language. No gross focal neurologic deficits are appreciated. No gait instability. Skin:  Skin is warm, dry and intact. Refer to GI above for full description of abscess Psychiatric: Mood and affect are normal. Speech and behavior are normal.  ____________________________________________   LABS (all labs ordered are listed, but only abnormal results are displayed)  Labs Reviewed  BASIC METABOLIC PANEL - Abnormal; Notable for the following:       Result Value   Sodium 133 (*)    Chloride 100 (*)    Glucose, Bld 327 (*)    BUN <5 (*)    All other components  within normal limits  CBC WITH DIFFERENTIAL/PLATELET - Abnormal; Notable for the following:    WBC 13.8 (*)    RDW 17.0 (*)    Neutro Abs 9.6 (*)    Monocytes Absolute 1.1 (*)    All other components within normal limits   ____________________________________________  EKG   ____________________________________________  RADIOLOGY   ____________________________________________   PROCEDURES  Procedure(s) performed: None  Critical Care performed: No  ____________________________________________   INITIAL IMPRESSION / ASSESSMENT AND PLAN / ED COURSE  Pertinent labs & imaging results that were available during my care of the patient were reviewed by me and considered in my medical decision making (see chart for details).  Patient given prescription for clindamycin and Percocet. Patient follow-up with PCP or return to ER in 48 hours or sooner if enlarging of the abscess. Referral to surgeon will be given at that time.  Clinical Course    ____________________________________________   FINAL CLINICAL IMPRESSION(S) / ED DIAGNOSES  Final diagnoses:  Abscess     This chart was dictated using voice recognition software/Dragon. Despite best efforts to proofread, errors can occur which can change the meaning. Any change was purely unintentional.    Evangeline Dakinharles M Betty Daidone, PA-C 11/21/15 2139    Sharman CheekPhillip Stafford, MD 11/24/15 [phone]

## 2015-11-21 NOTE — ED Notes (Addendum)
Pt to ed with c/o swelling and abscess to lower abd.  Pt with noted redness and swelling, skin warm to touch.  Pt states she was seen here for the same about 2 weeks ago and took bactrim without relief.  Pt denies chills or body aches but reports increased pain when anything touches her abd.

## 2016-01-12 ENCOUNTER — Encounter: Payer: Self-pay | Admitting: Emergency Medicine

## 2016-01-12 ENCOUNTER — Emergency Department
Admission: EM | Admit: 2016-01-12 | Discharge: 2016-01-12 | Disposition: A | Discharge status: Home / Self Care | Payer: Medicaid Other | Attending: Emergency Medicine | Admitting: Emergency Medicine

## 2016-01-12 ENCOUNTER — Emergency Department: Payer: Medicaid Other

## 2016-01-12 DIAGNOSIS — J40 Bronchitis, not specified as acute or chronic: Secondary | ICD-10-CM

## 2016-01-12 DIAGNOSIS — Z792 Long term (current) use of antibiotics: Secondary | ICD-10-CM | POA: Insufficient documentation

## 2016-01-12 DIAGNOSIS — E119 Type 2 diabetes mellitus without complications: Secondary | ICD-10-CM | POA: Insufficient documentation

## 2016-01-12 DIAGNOSIS — R05 Cough: Secondary | ICD-10-CM | POA: Diagnosis present

## 2016-01-12 DIAGNOSIS — Z79899 Other long term (current) drug therapy: Secondary | ICD-10-CM | POA: Diagnosis not present

## 2016-01-12 DIAGNOSIS — J209 Acute bronchitis, unspecified: Secondary | ICD-10-CM | POA: Diagnosis not present

## 2016-01-12 DIAGNOSIS — F17210 Nicotine dependence, cigarettes, uncomplicated: Secondary | ICD-10-CM | POA: Insufficient documentation

## 2016-01-12 DIAGNOSIS — J069 Acute upper respiratory infection, unspecified: Secondary | ICD-10-CM | POA: Insufficient documentation

## 2016-01-12 DIAGNOSIS — K0500 Acute gingivitis, plaque induced: Secondary | ICD-10-CM | POA: Diagnosis not present

## 2016-01-12 DIAGNOSIS — K0510 Chronic gingivitis, plaque induced: Secondary | ICD-10-CM

## 2016-01-12 LAB — POCT RAPID STREP A: Streptococcus, Group A Screen (Direct): NEGATIVE

## 2016-01-12 MED ORDER — ALBUTEROL SULFATE HFA 108 (90 BASE) MCG/ACT IN AERS: 2.0000 | INHALATION_SPRAY | RESPIRATORY_TRACT | 0 refills | Status: DC | PRN

## 2016-01-12 MED ORDER — PSEUDOEPH-BROMPHEN-DM 30-2-10 MG/5ML PO SYRP: 10.0000 mL | ORAL_SOLUTION | Freq: Four times a day (QID) | ORAL | 0 refills | Status: DC | PRN

## 2016-01-12 MED ORDER — PREDNISONE 50 MG PO TABS: 50.0000 mg | ORAL_TABLET | Freq: Every day | ORAL | 0 refills | Status: DC

## 2016-01-12 MED ORDER — CHLORHEXIDINE GLUCONATE 0.12% ORAL RINSE (MEDLINE KIT): 5.0000 mL | Freq: Two times a day (BID) | OROMUCOSAL | 0 refills | Status: DC

## 2016-01-12 NOTE — ED Notes (Signed)
Pt verbalized understanding of discharge instructions. NAD at this time. 

## 2016-01-12 NOTE — ED Provider Notes (Signed)
[facility] Emergency Department Provider Note  ____________________________________________  Time seen: Approximately 3:34 PM  I have reviewed the triage vital signs and the nursing notes.   HISTORY  Chief Complaint Sore Throat    HPI Brandi Myers is a 30 y.o. female who presents emergency department complaining of a week plus of sore throat and nonproductive coughing. Patient states that she's had some mild nasal congestion with this but denies any headache, visual changes, fevers or chills, chest pain, shortness of breath, abdominal pain, nausea or vomiting. Patient states that she is tried multiple over-the-counter medications with no relief. Patient states that after a week of doing with this with no improvement she decided to present to emergency department. Patient states that she does not feel "sick" she just does not feel "good". Patient also endorses some mild gingival inflammation and pain. Patient states that she started that this was not seen them in many years. She denies any significant pain in the region. No drainage.   Past Medical History:  Diagnosis Date  . Diabetes mellitus without complication ([place])     There are no active problems to display for this patient.   Past Surgical History:  Procedure Laterality Date  . CESAREAN SECTION    . CHOLECYSTECTOMY      Prior to Admission medications   Medication Sig Start Date End Date Taking? Authorizing Provider  albuterol (PROVENTIL HFA;VENTOLIN HFA) 108 (90 Base) MCG/ACT inhaler Inhale 2 puffs into the lungs every 4 (four) hours as needed for wheezing or shortness of breath. 01/12/16   Charline Bills Cuthriell, PA-C  brompheniramine-pseudoephedrine-DM 30-2-10 MG/5ML syrup Take 10 mLs by mouth 4 (four) times daily as needed. 01/12/16   Charline Bills Cuthriell, PA-C  chlorhexidine gluconate, MEDLINE KIT, (PERIDEX) 0.12 % solution Use as directed 5 mLs in the mouth or throat 2 (two) times daily. 01/12/16    Charline Bills Cuthriell, PA-C  clindamycin (CLEOCIN) 300 MG capsule Take 1 capsule (300 mg total) by mouth 3 (three) times daily. 11/21/15   Pierce Crane Beers, PA-C  ibuprofen (ADVIL,MOTRIN) 600 MG tablet Take 1 tablet (600 mg total) by mouth every 6 (six) hours as needed. 11/02/15   Jami L Hagler, PA-C  oxyCODONE-acetaminophen (ROXICET) 5-325 MG tablet Take 1-2 tablets by mouth every 4 (four) hours as needed for severe pain. 11/21/15   Arlyss Repress, PA-C  predniSONE (DELTASONE) 50 MG tablet Take 1 tablet (50 mg total) by mouth daily with breakfast. 01/12/16   Charline Bills Cuthriell, PA-C  sulfamethoxazole-trimethoprim (BACTRIM DS,SEPTRA DS) 800-160 MG tablet Take 1 tablet by mouth 2 (two) times daily. 11/02/15   Jami L Hagler, PA-C    Allergies Norco [hydrocodone-acetaminophen]  No family history on file.  Social History Social History  Substance Use Topics  . Smoking status: Current Every Day Smoker    Packs/day: 0.10    Types: Cigarettes  . Smokeless tobacco: Not on file  . Alcohol use Yes     Comment: occ.     Review of Systems  Constitutional: No fever/chills Eyes: No visual changes.  ENT: Positive nasal congestion. Positive sore throat. Positive for gingival inflammation. Cardiovascular: no chest pain. Respiratory: Positive nonproductive cough. No SOB. Gastrointestinal: No abdominal pain.  No nausea, no vomiting.  No diarrhea.  No constipation.} Musculoskeletal: Negative for musculoskeletal pain. Skin: Negative for rash, abrasions, lacerations, ecchymosis. Neurological: Negative for headaches, focal weakness or numbness. 10-point ROS otherwise negative.  ____________________________________________   PHYSICAL EXAM:  VITAL SIGNS: ED Triage Vitals  Enc Vitals Group     BP 01/12/16 1447 124/81     Pulse Rate 01/12/16 1447 (!) 102     Resp 01/12/16 1447 18     Temp 01/12/16 1447 98 F (36.7 C)     Temp Source 01/12/16 1447 Oral     SpO2 01/12/16 1447 100 %     Weight  01/12/16 1448 182 lb (82.6 kg)     Height 01/12/16 1448 5' 5"  (1.651 m)     Head Circumference --      Peak Flow --      Pain Score 01/12/16 1449 10     Pain Loc --      Pain Edu? --      Excl. in [place]? --      Constitutional: Alert and oriented. Well appearing and in no acute distress. Eyes: Conjunctivae are normal. PERRL. EOMI. Head: Atraumatic. ENT:      Ears: EACs and TMs unremarkable bilaterally.      Nose: Mild to moderate clear congestion/rhinnorhea. Turbinates are slightly erythematous and edematous.      Mouth/Throat: Mucous membranes are moist. Oropharynx is mildly erythematous but nonedematous. Tongue has mild enlargement of the tonsils. No exudates and tonsils. No tonsillar stones identified. Uvula is midline. Patient does have mild gingival irritation to the left, front dentition. No visible signs of infection or abscess. Neck: No stridor.   Hematological/Lymphatic/Immunilogical: No cervical lymphadenopathy. Cardiovascular: Normal rate, regular rhythm. Normal S1 and S2.  Good peripheral circulation. Respiratory: Normal respiratory effort without tachypnea or retractions. Lungs scattered coarse breath sounds but no wheezing, rales, rhonchi. Good air entry to the bases with no decreased or absent breath sounds. Musculoskeletal: Full range of motion to all extremities. No gross deformities appreciated. Neurologic:  Normal speech and language. No gross focal neurologic deficits are appreciated.  Skin:  Skin is warm, dry and intact. No rash noted. Psychiatric: Mood and affect are normal. Speech and behavior are normal. Patient exhibits appropriate insight and judgement.   ____________________________________________   LABS (all labs ordered are listed, but only abnormal results are displayed)  Labs Reviewed  POCT RAPID STREP A   ____________________________________________  EKG   ____________________________________________  RADIOLOGY Diamantina Providence Cuthriell,  personally viewed and evaluated these images (plain radiographs) as part of my medical decision making, as well as reviewing the written report by the radiologist.  Dg Chest 2 View  Result Date: 01/12/2016 CLINICAL DATA:  30 year old female with nonproductive cough for more than 1 week. Smoker. EXAM: CHEST  2 VIEW COMPARISON:  Chest x-ray 06/26/2007. FINDINGS: Mild diffuse peribronchial cuffing. Lung volumes are normal. No consolidative airspace disease. No pleural effusions. No pneumothorax. No pulmonary nodule or mass noted. Pulmonary vasculature and the cardiomediastinal silhouette are within normal limits. IMPRESSION: 1. Mild diffuse peribronchial cuffing, concerning for an acute bronchitis. Electronically Signed   By: Vinnie Langton M.D.   On: 01/12/2016 16:09    ____________________________________________    PROCEDURES  Procedure(s) performed:    Procedures    Medications - No data to display   ____________________________________________   INITIAL IMPRESSION / ASSESSMENT AND PLAN / ED COURSE  Pertinent labs & imaging results that were available during my care of the patient were reviewed by me and considered in my medical decision making (see chart for details).  Review of the [place] CSRS was performed in accordance of the [place] prior to dispensing any controlled drugs.  Clinical Course    Patient's diagnosis is consistent with Probable upper respiratory  infection with bronchitis. Patient also has a diagnosis gingivitis. Exam is reassuring. X-ray reveals mild peribronchial cuffing consistent with bronchitis. Patient does have some mild gingival inflammation with no signs of infection.. Patient will be discharged home with prescriptions for cough syrup, albuterol, prednisone, chlorhexidine mouthwash.. Patient is to follow up with primary care or dentist as needed or otherwise directed. Patient is given ED precautions to return to the ED for any worsening or new  symptoms.     ____________________________________________  FINAL CLINICAL IMPRESSION(S) / ED DIAGNOSES  Final diagnoses:  URI (upper respiratory infection)  Bronchitis  Gingivitis      NEW MEDICATIONS STARTED DURING THIS VISIT:  New Prescriptions   ALBUTEROL (PROVENTIL HFA;VENTOLIN HFA) 108 (90 BASE) MCG/ACT INHALER    Inhale 2 puffs into the lungs every 4 (four) hours as needed for wheezing or shortness of breath.   BROMPHENIRAMINE-PSEUDOEPHEDRINE-DM 30-2-10 MG/5ML SYRUP    Take 10 mLs by mouth 4 (four) times daily as needed.   CHLORHEXIDINE GLUCONATE, MEDLINE KIT, (PERIDEX) 0.12 % SOLUTION    Use as directed 5 mLs in the mouth or throat 2 (two) times daily.   PREDNISONE (DELTASONE) 50 MG TABLET    Take 1 tablet (50 mg total) by mouth daily with breakfast.        This chart was dictated using voice recognition software/Dragon. Despite best efforts to proofread, errors can occur which can change the meaning. Any change was purely unintentional.    Darletta Moll, PA-C 01/12/16 1655    Orbie Pyo, MD 01/12/16 [phone]

## 2016-01-12 NOTE — ED Triage Notes (Signed)
Sore throat and cough x 1 week.

## 2016-02-25 ENCOUNTER — Encounter: Payer: Self-pay | Admitting: Emergency Medicine

## 2016-02-25 ENCOUNTER — Inpatient Hospital Stay
Admission: EM | Admit: 2016-02-25 | Discharge: 2016-02-28 | DRG: 603 | Disposition: A | Discharge status: Home / Self Care | Payer: Medicaid Other | Attending: Internal Medicine | Admitting: Internal Medicine

## 2016-02-25 DIAGNOSIS — L0390 Cellulitis, unspecified: Secondary | ICD-10-CM | POA: Diagnosis present

## 2016-02-25 DIAGNOSIS — E871 Hypo-osmolality and hyponatremia: Secondary | ICD-10-CM | POA: Diagnosis present

## 2016-02-25 DIAGNOSIS — J449 Chronic obstructive pulmonary disease, unspecified: Secondary | ICD-10-CM | POA: Diagnosis present

## 2016-02-25 DIAGNOSIS — Z23 Encounter for immunization: Secondary | ICD-10-CM

## 2016-02-25 DIAGNOSIS — F17210 Nicotine dependence, cigarettes, uncomplicated: Secondary | ICD-10-CM | POA: Diagnosis present

## 2016-02-25 DIAGNOSIS — L02211 Cutaneous abscess of abdominal wall: Principal | ICD-10-CM | POA: Diagnosis present

## 2016-02-25 DIAGNOSIS — W57XXXA Bitten or stung by nonvenomous insect and other nonvenomous arthropods, initial encounter: Secondary | ICD-10-CM | POA: Diagnosis present

## 2016-02-25 DIAGNOSIS — Z885 Allergy status to narcotic agent status: Secondary | ICD-10-CM

## 2016-02-25 DIAGNOSIS — E1165 Type 2 diabetes mellitus with hyperglycemia: Secondary | ICD-10-CM | POA: Diagnosis present

## 2016-02-25 DIAGNOSIS — L0291 Cutaneous abscess, unspecified: Secondary | ICD-10-CM

## 2016-02-25 DIAGNOSIS — Z833 Family history of diabetes mellitus: Secondary | ICD-10-CM

## 2016-02-25 DIAGNOSIS — Z794 Long term (current) use of insulin: Secondary | ICD-10-CM

## 2016-02-25 DIAGNOSIS — L03311 Cellulitis of abdominal wall: Secondary | ICD-10-CM

## 2016-02-25 LAB — BASIC METABOLIC PANEL
Anion gap: 11 (ref 5–15)
BUN: 11 mg/dL (ref 6–20)
CHLORIDE: 90 mmol/L — AB (ref 101–111)
CO2: 26 mmol/L (ref 22–32)
Calcium: 8.9 mg/dL (ref 8.9–10.3)
Creatinine, Ser: 0.76 mg/dL (ref 0.44–1.00)
GFR calc Af Amer: >60 mL/min (ref >60)
GFR calc non Af Amer: >60 mL/min (ref >60)
GLUCOSE: 494 mg/dL — AB (ref 65–99)
POTASSIUM: 3.7 mmol/L (ref 3.5–5.1)
Sodium: 127 mmol/L — ABNORMAL LOW (ref 135–145)

## 2016-02-25 LAB — CBC
HEMATOCRIT: 46.2 % (ref 35.0–47.0)
HEMOGLOBIN: 15.2 g/dL (ref 12.0–16.0)
MCH: 26.9 pg (ref 26.0–34.0)
MCHC: 32.8 g/dL (ref 32.0–36.0)
MCV: 81.9 fL (ref 80.0–100.0)
Platelets: 283 10*3/uL (ref 150–440)
RBC: 5.64 MIL/uL — AB (ref 3.80–5.20)
RDW: 18.2 % — ABNORMAL HIGH (ref 11.5–14.5)
WBC: 13.6 10*3/uL — ABNORMAL HIGH (ref 3.6–11.0)

## 2016-02-25 LAB — GLUCOSE, CAPILLARY
GLUCOSE-CAPILLARY: 446 mg/dL — AB (ref 65–99)
Glucose-Capillary: 494 mg/dL — ABNORMAL HIGH (ref 65–99)
Glucose-Capillary: 538 mg/dL (ref 65–99)

## 2016-02-25 LAB — URINALYSIS COMPLETE WITH MICROSCOPIC (ARMC ONLY)
BACTERIA UA: NONE SEEN
BILIRUBIN URINE: NEGATIVE
Hgb urine dipstick: NEGATIVE
Ketones, ur: NEGATIVE mg/dL
Leukocytes, UA: NEGATIVE
Nitrite: NEGATIVE
Protein, ur: NEGATIVE mg/dL
Specific Gravity, Urine: 1.032 — ABNORMAL HIGH (ref 1.005–1.030)
pH: 6 (ref 5.0–8.0)

## 2016-02-25 LAB — POCT PREGNANCY, URINE: PREG TEST UR: NEGATIVE

## 2016-02-25 MED ORDER — SODIUM CHLORIDE 0.9 % IV SOLN
1000.0000 mL | Freq: Once | INTRAVENOUS | Status: AC
  Administered 2016-02-25: 1000 mL via INTRAVENOUS

## 2016-02-25 NOTE — ED Notes (Signed)
Pt reports eating a cheeseburger approx 1 hour ago. Pt reports it's been over 1 week since she's taken any medications for her diabetes.

## 2016-02-25 NOTE — ED Notes (Signed)
MD Kinner at bedside  

## 2016-02-25 NOTE — ED Triage Notes (Addendum)
Patient ambulatory to triage with steady gait, without difficulty or distress noted; pt reports ?bites to abdomen x 3-4 days; several small scabbed areas noted to arms and lower abd; areas to abd reddened with few draining clear fluid; hx diabetes, insulin dependent

## 2016-02-25 NOTE — ED Notes (Signed)
Pt reports being bitten at motel by bugs at the beginning of the month. Pt reports 3 days ago the sores on her abdomen began to look infected, so she burst them. Pt reports pus/drainage from sores. Pt c/o pain at bite/sore sites on abdomen rated at 10 out of 10.

## 2016-02-25 NOTE — ED Provider Notes (Signed)
Brandi Myers &  [facility] Emergency Department Provider Note   ____________________________________________    I have reviewed the triage vital signs and the nursing notes.   HISTORY  Chief Complaint Abscess     HPI [name] is a 30 y.o. female who presents with complaints of being bitten by something. Patient reports several red areas on her abdomen that are tender and recently have been draining as well. She denies fevers or chills. She denies a history of abscesses. She does have a history of diabetes. No nausea or vomiting. She is unclear what caused these areas. No recent travel   Past Medical History:  Diagnosis Date  . Diabetes mellitus without complication ([place])     There are no active problems to display for this patient.   Past Surgical History:  Procedure Laterality Date  . CESAREAN SECTION    . CHOLECYSTECTOMY      Prior to Admission medications   Medication Sig Start Date End Date Taking? Authorizing Provider  albuterol (PROVENTIL HFA;VENTOLIN HFA) 108 (90 Base) MCG/ACT inhaler Inhale 2 puffs into the lungs every 4 (four) hours as needed for wheezing or shortness of breath. 01/12/16   Charline Bills Cuthriell, PA-C  brompheniramine-pseudoephedrine-DM 30-2-10 MG/5ML syrup Take 10 mLs by mouth 4 (four) times daily as needed. 01/12/16   Charline Bills Cuthriell, PA-C  chlorhexidine gluconate, MEDLINE KIT, (PERIDEX) 0.12 % solution Use as directed 5 mLs in the mouth or throat 2 (two) times daily. 01/12/16   Charline Bills Cuthriell, PA-C  clindamycin (CLEOCIN) 300 MG capsule Take 1 capsule (300 mg total) by mouth 3 (three) times daily. 11/21/15   Pierce Crane Beers, PA-C  ibuprofen (ADVIL,MOTRIN) 600 MG tablet Take 1 tablet (600 mg total) by mouth every 6 (six) hours as needed. 11/02/15   Jami L Hagler, PA-C  oxyCODONE-acetaminophen (ROXICET) 5-325 MG tablet Take 1-2 tablets by mouth every 4 (four) hours as needed for severe pain. 11/21/15   Arlyss Repress, PA-C    predniSONE (DELTASONE) 50 MG tablet Take 1 tablet (50 mg total) by mouth daily with breakfast. 01/12/16   Charline Bills Cuthriell, PA-C  sulfamethoxazole-trimethoprim (BACTRIM DS,SEPTRA DS) 800-160 MG tablet Take 1 tablet by mouth 2 (two) times daily. 11/02/15   Jami L Hagler, PA-C     Allergies Norco [hydrocodone-acetaminophen]  No family history on file.  Social History Social History  Substance Use Topics  . Smoking status: Current Every Day Smoker    Packs/day: 0.10    Types: Cigarettes  . Smokeless tobacco: Never Used  . Alcohol use Yes     Comment: occ.    Review of Systems  Constitutional: No fever/chills  Cardiovascular: Denies chest pain. Respiratory: Denies shortness of breath. Gastrointestinal: No abdominal pain.  No nausea, no vomiting.   Genitourinary: Negative for dysuria. Musculoskeletal: Negative for back pain. Skin: As above Neurological: Negative for headaches or weakness  10-point ROS otherwise negative.  ____________________________________________   PHYSICAL EXAM:  VITAL SIGNS: ED Triage Vitals  Enc Vitals Group     BP 02/25/16 1959 104/70     Pulse Rate 02/25/16 1959 (!) 114     Resp 02/25/16 1959 20     Temp 02/25/16 1959 97.8 F (36.6 C)     Temp Source 02/25/16 1959 Oral     SpO2 02/25/16 1959 100 %     Weight 02/25/16 2000 200 lb (90.7 kg)     Height 02/25/16 2000 _0  (1.651 m)     Head Circumference --  Peak Flow --      Pain Score 02/25/16 2000 10     Pain Loc --      Pain Edu? --      Excl. in [place]? --     Constitutional: Alert and oriented. No acute distress. Pleasant and interactive  Nose: No congestion/rhinnorhea. Mouth/Throat: Mucous membranes are moist.    Cardiovascular:Mild tachycardia, regular rhythm. Grossly normal heart sounds.  Good peripheral circulation. Respiratory: Normal respiratory effort.  No retractions. Lungs CTAB. Gastrointestinal: Soft and nontender. No distention.  No CVA tenderness. Genitourinary:  deferred Musculoskeletal: No lower extremity tenderness nor edema.  Warm and well perfused Neurologic:  Normal speech and language. No gross focal neurologic deficits are appreciated.  Skin:  Skin is warm, dry. 4 erythematous tender indurated areas on the lower abdominal wall varying from 2-3 cm in diameter. 2 of these are already draining, no fluctuance under the other 2. Mild erythema around each of them but not convalescing Psychiatric: Mood and affect are normal. Speech and behavior are normal.  ____________________________________________   LABS (all labs ordered are listed, but only abnormal results are displayed)  Labs Reviewed  URINALYSIS COMPLETEWITH MICROSCOPIC ([facility] ONLY) - Abnormal; Notable for the following:       Result Value   Color, Urine STRAW (*)    APPearance CLEAR (*)    Glucose, UA >500 (*)    Specific Gravity, Urine 1.032 (*)    Squamous Epithelial / LPF 0-5 (*)    All other components within normal limits  CBC - Abnormal; Notable for the following:    WBC 13.6 (*)    RBC 5.64 (*)    RDW 18.2 (*)    All other components within normal limits  BASIC METABOLIC PANEL - Abnormal; Notable for the following:    Sodium 127 (*)    Chloride 90 (*)    Glucose, Bld 494 (*)    All other components within normal limits  GLUCOSE, CAPILLARY - Abnormal; Notable for the following:    Glucose-Capillary 494 (*)    All other components within normal limits  GLUCOSE, CAPILLARY - Abnormal; Notable for the following:    Glucose-Capillary 538 (*)    All other components within normal limits  GLUCOSE, CAPILLARY - Abnormal; Notable for the following:    Glucose-Capillary 446 (*)    All other components within normal limits  GLUCOSE, CAPILLARY - Abnormal; Notable for the following:    Glucose-Capillary 335 (*)    All other components within normal limits  CULTURE, BLOOD (ROUTINE X 2)  CULTURE, BLOOD (ROUTINE X 2)  LACTIC ACID, PLASMA  LACTIC ACID, PLASMA  POCT PREGNANCY,  URINE  CBG MONITORING, ED   ____________________________________________  EKG  None ____________________________________________  RADIOLOGY  None ____________________________________________   PROCEDURES  Procedure(s) performed: No    Critical Care performed: No ____________________________________________   INITIAL IMPRESSION / ASSESSMENT AND PLAN / ED COURSE  Pertinent labs & imaging results that were available during my care of the patient were reviewed by me and considered in my medical decision making (see chart for details).  Patient presents with likely inflammatory reaction to insect bites, possibly infected. She is mildly tachycardic although I suspect this is from elevated blood glucose, anion gap is normal. Mild elevation in white blood cell count but afebrile. We will give IV fluids and treat with antibiotics.  Clinical Course   Patient remains tachycardic even after IV fluids. She may require admission for IV antibiotics ____________________________________________   FINAL CLINICAL IMPRESSION(S) /  ED DIAGNOSES  Final diagnoses:  Cellulitis of abdominal wall  Abscess      NEW MEDICATIONS STARTED DURING THIS VISIT:  New Prescriptions   No medications on file     Note:  This document was prepared using Dragon voice recognition software and may include unintentional dictation errors.    Lavonia Drafts, MD 02/26/16 [phone]

## 2016-02-26 ENCOUNTER — Encounter: Payer: Self-pay | Admitting: Internal Medicine

## 2016-02-26 DIAGNOSIS — F17210 Nicotine dependence, cigarettes, uncomplicated: Secondary | ICD-10-CM | POA: Diagnosis present

## 2016-02-26 DIAGNOSIS — J449 Chronic obstructive pulmonary disease, unspecified: Secondary | ICD-10-CM | POA: Diagnosis present

## 2016-02-26 DIAGNOSIS — L03311 Cellulitis of abdominal wall: Secondary | ICD-10-CM | POA: Diagnosis present

## 2016-02-26 DIAGNOSIS — Z833 Family history of diabetes mellitus: Secondary | ICD-10-CM | POA: Diagnosis not present

## 2016-02-26 DIAGNOSIS — E1165 Type 2 diabetes mellitus with hyperglycemia: Secondary | ICD-10-CM | POA: Diagnosis present

## 2016-02-26 DIAGNOSIS — E871 Hypo-osmolality and hyponatremia: Secondary | ICD-10-CM | POA: Diagnosis present

## 2016-02-26 DIAGNOSIS — Z794 Long term (current) use of insulin: Secondary | ICD-10-CM | POA: Diagnosis not present

## 2016-02-26 DIAGNOSIS — Z23 Encounter for immunization: Secondary | ICD-10-CM | POA: Diagnosis not present

## 2016-02-26 DIAGNOSIS — W57XXXA Bitten or stung by nonvenomous insect and other nonvenomous arthropods, initial encounter: Secondary | ICD-10-CM | POA: Diagnosis present

## 2016-02-26 DIAGNOSIS — L02211 Cutaneous abscess of abdominal wall: Secondary | ICD-10-CM | POA: Diagnosis not present

## 2016-02-26 DIAGNOSIS — Z885 Allergy status to narcotic agent status: Secondary | ICD-10-CM | POA: Diagnosis not present

## 2016-02-26 DIAGNOSIS — L0390 Cellulitis, unspecified: Secondary | ICD-10-CM | POA: Diagnosis present

## 2016-02-26 LAB — LACTIC ACID, PLASMA
LACTIC ACID, VENOUS: 1.5 mmol/L (ref 0.5–1.9)
LACTIC ACID, VENOUS: 1 mmol/L (ref 0.5–1.9)

## 2016-02-26 LAB — CBC
HCT: 39.8 % (ref 35.0–47.0)
Hemoglobin: 13.5 g/dL (ref 12.0–16.0)
MCH: 27.3 pg (ref 26.0–34.0)
MCHC: 33.8 g/dL (ref 32.0–36.0)
MCV: 80.9 fL (ref 80.0–100.0)
PLATELETS: 257 10*3/uL (ref 150–440)
RBC: 4.92 MIL/uL (ref 3.80–5.20)
RDW: 18 % — AB (ref 11.5–14.5)
WBC: 11.5 10*3/uL — ABNORMAL HIGH (ref 3.6–11.0)

## 2016-02-26 LAB — GLUCOSE, CAPILLARY
GLUCOSE-CAPILLARY: 301 mg/dL — AB (ref 65–99)
GLUCOSE-CAPILLARY: 183 mg/dL — AB (ref 65–99)
Glucose-Capillary: 205 mg/dL — ABNORMAL HIGH (ref 65–99)
Glucose-Capillary: 273 mg/dL — ABNORMAL HIGH (ref 65–99)
Glucose-Capillary: 301 mg/dL — ABNORMAL HIGH (ref 65–99)
Glucose-Capillary: 335 mg/dL — ABNORMAL HIGH (ref 65–99)

## 2016-02-26 LAB — BASIC METABOLIC PANEL
Anion gap: 7 (ref 5–15)
BUN: 10 mg/dL (ref 6–20)
CALCIUM: 8.5 mg/dL — AB (ref 8.9–10.3)
CO2: 25 mmol/L (ref 22–32)
CREATININE: 0.76 mg/dL (ref 0.44–1.00)
Chloride: 102 mmol/L (ref 101–111)
GFR calc Af Amer: >60 mL/min (ref >60)
GFR calc non Af Amer: >60 mL/min (ref >60)
GLUCOSE: 289 mg/dL — AB (ref 65–99)
Potassium: 3.4 mmol/L — ABNORMAL LOW (ref 3.5–5.1)
Sodium: 134 mmol/L — ABNORMAL LOW (ref 135–145)

## 2016-02-26 MED ORDER — ACETAMINOPHEN 325 MG PO TABS: 650.0000 mg | ORAL_TABLET | Freq: Four times a day (QID) | ORAL | Status: DC | PRN

## 2016-02-26 MED ORDER — SODIUM CHLORIDE 0.9 % IV SOLN
1000.0000 mL | Freq: Once | INTRAVENOUS | Status: AC
  Administered 2016-02-26: 1000 mL via INTRAVENOUS

## 2016-02-26 MED ORDER — ONDANSETRON HCL 4 MG/2ML IJ SOLN: 4.0000 mg | Freq: Four times a day (QID) | INTRAMUSCULAR | Status: DC | PRN

## 2016-02-26 MED ORDER — GABAPENTIN 300 MG PO CAPS
300.0000 mg | ORAL_CAPSULE | Freq: Three times a day (TID) | ORAL | Status: DC
  Administered 2016-02-26 – 2016-02-28 (×7): 300 mg via ORAL
  Filled 2016-02-26 (×7): qty 1

## 2016-02-26 MED ORDER — ENOXAPARIN SODIUM 40 MG/0.4ML ~~LOC~~ SOLN
40.0000 mg | SUBCUTANEOUS | Status: DC
Start: 2016-02-26 — End: 2016-02-28
  Administered 2016-02-26 – 2016-02-27 (×2): 40 mg via SUBCUTANEOUS
  Filled 2016-02-26 (×3): qty 0.4

## 2016-02-26 MED ORDER — INSULIN ASPART 100 UNIT/ML ~~LOC~~ SOLN: 0.0000 [IU] | Freq: Every day | SUBCUTANEOUS | Status: DC

## 2016-02-26 MED ORDER — INSULIN ASPART 100 UNIT/ML ~~LOC~~ SOLN
0.0000 [IU] | Freq: Three times a day (TID) | SUBCUTANEOUS | Status: DC
  Administered 2016-02-26: 15 [IU] via SUBCUTANEOUS
  Administered 2016-02-26: 11 [IU] via SUBCUTANEOUS
  Administered 2016-02-26: 7 [IU] via SUBCUTANEOUS
  Administered 2016-02-27: 18:00:00 4 [IU] via SUBCUTANEOUS
  Administered 2016-02-27: 7 [IU] via SUBCUTANEOUS
  Administered 2016-02-27: 11 [IU] via SUBCUTANEOUS
  Administered 2016-02-28 (×2): 3 [IU] via SUBCUTANEOUS
  Filled 2016-02-26: qty 7
  Filled 2016-02-26: qty 3
  Filled 2016-02-26: qty 7
  Filled 2016-02-26: qty 15
  Filled 2016-02-26 (×2): qty 11
  Filled 2016-02-26: qty 3
  Filled 2016-02-26: qty 4

## 2016-02-26 MED ORDER — ONDANSETRON HCL 4 MG PO TABS
4.0000 mg | ORAL_TABLET | Freq: Four times a day (QID) | ORAL | Status: DC | PRN
Start: 2016-02-26 — End: 2016-02-28

## 2016-02-26 MED ORDER — VANCOMYCIN HCL IN DEXTROSE 1-5 GM/200ML-% IV SOLN
1000.0000 mg | Freq: Once | INTRAVENOUS | Status: AC
  Administered 2016-02-26: 1000 mg via INTRAVENOUS
  Filled 2016-02-26: qty 200

## 2016-02-26 MED ORDER — VANCOMYCIN HCL IN DEXTROSE 1-5 GM/200ML-% IV SOLN
1000.0000 mg | Freq: Three times a day (TID) | INTRAVENOUS | Status: DC
  Administered 2016-02-26 – 2016-02-27 (×5): 1000 mg via INTRAVENOUS
  Filled 2016-02-26 (×7): qty 200

## 2016-02-26 MED ORDER — ACETAMINOPHEN 650 MG RE SUPP: 650.0000 mg | Freq: Four times a day (QID) | RECTAL | Status: DC | PRN

## 2016-02-26 MED ORDER — SODIUM CHLORIDE 0.9 % IV SOLN
INTRAVENOUS | Status: DC
  Administered 2016-02-26 – 2016-02-27 (×2): via INTRAVENOUS

## 2016-02-26 MED ORDER — VANCOMYCIN HCL IN DEXTROSE 1-5 GM/200ML-% IV SOLN: 1000.0000 mg | Freq: Once | INTRAVENOUS | Status: DC

## 2016-02-26 MED ORDER — KETOROLAC TROMETHAMINE 30 MG/ML IJ SOLN
INTRAMUSCULAR | Status: AC
  Filled 2016-02-26: qty 1

## 2016-02-26 MED ORDER — POTASSIUM CHLORIDE CRYS ER 20 MEQ PO TBCR
40.0000 meq | EXTENDED_RELEASE_TABLET | Freq: Once | ORAL | Status: AC
  Administered 2016-02-26: 18:00:00 40 meq via ORAL
  Filled 2016-02-26: qty 2

## 2016-02-26 MED ORDER — KETOROLAC TROMETHAMINE 30 MG/ML IJ SOLN
30.0000 mg | Freq: Four times a day (QID) | INTRAMUSCULAR | Status: DC | PRN
  Administered 2016-02-26 – 2016-02-27 (×4): 30 mg via INTRAVENOUS
  Filled 2016-02-26 (×4): qty 1

## 2016-02-26 MED ORDER — INFLUENZA VAC SPLIT QUAD 0.5 ML IM SUSY
0.5000 mL | PREFILLED_SYRINGE | INTRAMUSCULAR | Status: AC
  Administered 2016-02-27: 09:00:00 0.5 mL via INTRAMUSCULAR
  Filled 2016-02-26: qty 0.5

## 2016-02-26 MED ORDER — KETOROLAC TROMETHAMINE 30 MG/ML IJ SOLN
30.0000 mg | Freq: Once | INTRAMUSCULAR | Status: AC
  Administered 2016-02-26: 30 mg via INTRAVENOUS

## 2016-02-26 MED ORDER — SENNOSIDES-DOCUSATE SODIUM 8.6-50 MG PO TABS: 1.0000 | ORAL_TABLET | Freq: Every evening | ORAL | Status: DC | PRN

## 2016-02-26 MED ORDER — INSULIN GLARGINE 100 UNIT/ML ~~LOC~~ SOLN
17.0000 [IU] | Freq: Every day | SUBCUTANEOUS | Status: DC
  Administered 2016-02-26: 22:00:00 17 [IU] via SUBCUTANEOUS
  Filled 2016-02-26 (×2): qty 0.17

## 2016-02-26 NOTE — Progress Notes (Signed)
Admitted this morning because of abdominal wall cellulitis, patient has diabetes, having this abdominal wall infection for the past 3 weeks, patient tried hydrocortisone but for the last 3 days she really got more pustules and she got worried for possible spreading infection.  Lab data, vitals, medications are reviewed.  Continue IV vancomycin for possible MRSA skin infection; continue IV vancomycin for 2 days, a by tomorrow if she doesn't improve I will ask ID, surgery to see the patient  Diabetes mellitus type 2: Patient is on Lantus, exercise prescription coverage. Patient sees [facility] but she wants to switch the doctors.  Tobacco abuse:counselled against  the smoking, counseled to quit for about 10 minutes. Patient is in the process of cutting down.  Time spent 20 minutes.

## 2016-02-26 NOTE — Progress Notes (Signed)
Inpatient Diabetes Program Recommendations  AACE/ADA: New Consensus Statement on Inpatient Glycemic Control (2015)  Target Ranges:  Prepandial:   less than 140 mg/dL      Peak postprandial:   less than 180 mg/dL (1-2 hours)      Critically ill patients:  140 - 180 mg/dL   Results for Lynnea MaizesRICE, Llesenia L (MRN [id number]) as of 02/26/2016 08:21  Ref. Range 02/25/2016 20:15 02/25/2016 22:48 02/26/2016 00:46 02/26/2016 03:28 02/26/2016 07:47  Glucose-Capillary Latest Ref Range: 65 - 99 mg/dL 119538 (HH) 147446 (H) 829335 (H) 301 (H) 273 (H)    Admit with: Abscess/ Cellulitis of Abd Wall  History: DM  Home DM Meds: Lantus 17 units QHS  Current Insulin Orders: Lantus 17 units QHS      Novolog Resistant Correction Scale/ SSI (0-20 units) TID AC + HS       -Note patient given IVF in the ED.  CBG down to 273 mg/dl by 8am today.  -Novolog SSI to start this AM.  Lantus not scheduled to start until tonight.     MD- Please consider the following in-hospital insulin adjustments:  1. Start Lantus at 12pm today and can move tomorrow's dose (11/15) back to bedtime schedule to get some basal insulin on board for this patient rather than waiting until tonight to start Lantus.  2. Check current A1c level to assess recent home glucose control.     --Will follow patient during hospitalization--  Ambrose FinlandJeannine Johnston Broady Lafoy RN, MSN, CDE Diabetes Coordinator Inpatient Glycemic Control Team Team Pager: [phone] (8a-5p)

## 2016-02-26 NOTE — H&P (Signed)
[facility] [place] [facility]   PATIENT NAME: Brandi Myers    MR#:  [id number]  DATE OF BIRTH:  1985/07/15  DATE OF ADMISSION:  02/25/2016  PRIMARY CARE PHYSICIAN: No PCP Per Patient   REQUESTING/REFERRING PHYSICIAN:   CHIEF COMPLAINT:   Chief Complaint  Patient presents with  . Abscess    HISTORY OF PRESENT ILLNESS: Brandi Myers  is a 30 y.o. female with a known history of Type 1 diabetes mellitus who is currently on insulin presented to the emergency room because of redness on the abdominal wall as well as the skin abscess. Patient says this has been going on for more than a week and it started when she first stated in a motor vehicle 1 week ago. Patient says she had a insect bite or the abdominal wall but does not know whether it is a spider or any other bug. Initially started as 2 small localized area of infection which later on became red and spreading out. She currently has 3 lesions over the anterior abdominal wall and also redness around and below the umbilicus. There is pain and itching. She also has some multiple scabs and redness over the forearms. No complaints of any chest pain, shortness of breath. No history of fever or chills. No history of headache dizziness or blurry vision. Bedside lactate level was normal.  PAST MEDICAL HISTORY:   Past Medical History:  Diagnosis Date  . Diabetes mellitus without complication ([facility])     PAST SURGICAL HISTORY: Past Surgical History:  Procedure Laterality Date  . CESAREAN SECTION    . CHOLECYSTECTOMY      SOCIAL HISTORY:  Social History  Substance Use Topics  . Smoking status: Current Every Day Smoker    Packs/day: 0.10    Types: Cigarettes  . Smokeless tobacco: Never Used  . Alcohol use Yes     Comment: occ.    FAMILY HISTORY:  Family History  Problem Relation Age of Onset  . Diabetes Maternal Grandmother     DRUG ALLERGIES:  Allergies  Allergen Reactions  . Norco  [Hydrocodone-Acetaminophen] Itching, Nausea And Vomiting and Swelling    REVIEW OF SYSTEMS:   CONSTITUTIONAL: No fever, fatigue or weakness.  EYES: No blurred or double vision.  EARS, NOSE, AND THROAT: No tinnitus or ear pain.  RESPIRATORY: No cough, shortness of breath, wheezing or hemoptysis.  CARDIOVASCULAR: No chest pain, orthopnea, edema.  GASTROINTESTINAL: No nausea, vomiting, diarrhea or abdominal pain.  GENITOURINARY: No dysuria, hematuria.  ENDOCRINE: No polyuria, nocturia,  HEMATOLOGY: No anemia, easy bruising or bleeding SKIN:Redness over the abdominal wall Lesions over the anterior abdominal wall, and also on the fore arms. MUSCULOSKELETAL: No joint pain or arthritis.   NEUROLOGIC: No tingling, numbness, weakness.  PSYCHIATRY: No anxiety or depression.   MEDICATIONS AT HOME:  Prior to Admission medications   Medication Sig Start Date End Date Taking? Authorizing Provider  gabapentin (NEURONTIN) 300 MG capsule Take 300 mg by mouth 3 (three) times daily.   Yes Historical Provider, MD  insulin glargine (LANTUS) 100 UNIT/ML injection Inject 17 Units into the skin at bedtime.   Yes Historical Provider, MD      PHYSICAL EXAMINATION:   VITAL SIGNS: Blood pressure (!) 123/91, pulse (!) 101, temperature 98.5 F (36.9 C), temperature source Oral, resp. rate 20, height 5\' 5"  (1.651 m), weight 83.9 kg (185 lb), last menstrual period 02/04/2016, SpO2 96 %.  GENERAL:  30 y.o.-year-old patient lying in the bed with no  acute distress.  EYES: Pupils equal, round, reactive to light and accommodation. No scleral icterus. Extraocular muscles intact.  HEENT: Head atraumatic, normocephalic. Oropharynx dry and nasopharynx clear.  NECK:  Supple, no jugular venous distention. No thyroid enlargement, no tenderness.  LUNGS: Normal breath sounds bilaterally, no wheezing, rales,rhonchi or crepitation. No use of accessory muscles of respiration.  CARDIOVASCULAR: S1, S2 normal. No murmurs, rubs,  or gallops.  ABDOMEN: Soft, nontender, nondistended. Bowel sounds present. No organomegaly or mass.  Redness around the umbilicus 3 lesions over the anterior abdominal wall. EXTREMITIES: No pedal edema, cyanosis, or clubbing.  NEUROLOGIC: Cranial nerves II through XII are intact. Muscle strength 5/5 in all extremities. Sensation intact. Gait not checked.  PSYCHIATRIC: The patient is alert and oriented x 3.  SKIN: Lesions over the fore arms, anterior abdominal wall.  LABORATORY PANEL:   CBC  Recent Labs Lab 02/25/16 2003  WBC 13.6*  HGB 15.2  HCT 46.2  PLT 283  MCV 81.9  MCH 26.9  MCHC 32.8  RDW 18.2*   ------------------------------------------------------------------------------------------------------------------  Chemistries   Recent Labs Lab 02/25/16 2003  NA 127*  K 3.7  CL 90*  CO2 26  GLUCOSE 494*  BUN 11  CREATININE 0.76  CALCIUM 8.9   ------------------------------------------------------------------------------------------------------------------ estimated creatinine clearance is 110.1 mL/min (by C-G formula based on SCr of 0.76 mg/dL). ------------------------------------------------------------------------------------------------------------------ No results for input(s): TSH, T4TOTAL, T3FREE, THYROIDAB in the last 72 hours.  Invalid input(s): FREET3   Coagulation profile No results for input(s): INR, PROTIME in the last 168 hours. ------------------------------------------------------------------------------------------------------------------- No results for input(s): DDIMER in the last 72 hours. -------------------------------------------------------------------------------------------------------------------  Cardiac Enzymes No results for input(s): CKMB, TROPONINI, MYOGLOBIN in the last 168 hours.  Invalid input(s): CK ------------------------------------------------------------------------------------------------------------------ Invalid  input(s): POCBNP  ---------------------------------------------------------------------------------------------------------------  Urinalysis    Component Value Date/Time   COLORURINE STRAW (A) 02/25/2016 2003   APPEARANCEUR CLEAR (A) 02/25/2016 2003   LABSPEC 1.032 (H) 02/25/2016 2003   PHURINE 6.0 02/25/2016 2003   GLUCOSEU >500 (A) 02/25/2016 2003   HGBUR NEGATIVE 02/25/2016 2003   BILIRUBINUR NEGATIVE 02/25/2016 2003   KETONESUR NEGATIVE 02/25/2016 2003   PROTEINUR NEGATIVE 02/25/2016 2003   NITRITE NEGATIVE 02/25/2016 2003   LEUKOCYTESUR NEGATIVE 02/25/2016 2003     RADIOLOGY: No results found.  EKG: Orders placed or performed in visit on 09/12/05  . EKG 12-Lead    IMPRESSION AND PLAN: 30 year old female patient with history of diabetes mellitus presented to the emergency room with redness of the abdominal wall. Admitting diagnosis 1. Abdominal wall cellulitis 2. Hyponatremia 3. Uncontrolled diabetes mellitus Treatment plan Mid patient to medical floor inpatient service Start patient on IV vancomycin antibiotic IV fluid hydration Follow up sodium level and WBC count Control blood sugars with Lantus insulin and sliding scale coverage DVT prophylaxis with subcutaneous Lovenox 40 MG daily Supportive care.  All the records are reviewed and case discussed with ED provider. Management plans discussed with the patient, family and they are in agreement.  CODE STATUS:FULL    Code Status Orders        Start     Ordered   02/26/16 0300  Full code  Continuous     02/26/16 0301    Code Status History    Date Active Date Inactive Code Status Order ID Comments User Context   This patient has a current code status but no historical code status.       TOTAL TIME TAKING CARE OF THIS PATIENT: 53 minutes.    Shron Ozer M.D  on 02/26/2016 at 3:02 AM  Between 7am to 6pm - Pager - [phone]  After 6pm go to [email or web address] - password [email or web address] [facility]  [facility]  Office  [phone]  CC: Primary care physician; No PCP Per Patient

## 2016-02-26 NOTE — Progress Notes (Signed)
Pharmacy Antibiotic Note  Brandi Myers is a 30 y.o. female admitted on 02/25/2016 with cellulitis.  Pharmacy has been consulted for vancomycin dosing.  Plan: DW 68kg  Vd 48L kei 0.096 hr-1  T1/2 7 hours Vancomycin 1 gram q 8 hours ordered. Level before 5th dose. Goal trough 15-20.  Height: 5\' 5"  (165.1 cm) Weight: 185 lb (83.9 kg) IBW/kg (Calculated) : 57  Temp (24hrs), Avg:98.2 F (36.8 C), Min:97.8 F (36.6 C), Max:98.5 F (36.9 C)   Recent Labs Lab 02/25/16 2003 02/26/16 0147  WBC 13.6*  --   CREATININE 0.76  --   LATICACIDVEN  --  1.5    Estimated Creatinine Clearance: 110.1 mL/min (by C-G formula based on SCr of 0.76 mg/dL).    Allergies  Allergen Reactions  . Norco [Hydrocodone-Acetaminophen] Itching, Nausea And Vomiting and Swelling    Antimicrobials this admission: vancomycin  >>    >>   Dose adjustments this admission:   Microbiology results: 11/14 BCx: pending    11/13 UA: (-)  Thank you for allowing pharmacy to be a part of this patient's care.  Antoine Fiallos S 02/26/2016 4:38 AM

## 2016-02-27 LAB — GLUCOSE, CAPILLARY
GLUCOSE-CAPILLARY: 232 mg/dL — AB (ref 65–99)
Glucose-Capillary: 256 mg/dL — ABNORMAL HIGH (ref 65–99)
Glucose-Capillary: 194 mg/dL — ABNORMAL HIGH (ref 65–99)
Glucose-Capillary: 197 mg/dL — ABNORMAL HIGH (ref 65–99)

## 2016-02-27 LAB — RAPID HIV SCREEN (HIV 1/2 AB+AG)
HIV 1/2 Antibodies: NONREACTIVE
HIV-1 P24 Antigen - HIV24: NONREACTIVE

## 2016-02-27 LAB — CREATININE, SERUM
Creatinine, Ser: 0.56 mg/dL (ref 0.44–1.00)
GFR calc non Af Amer: >60 mL/min (ref >60)

## 2016-02-27 LAB — VANCOMYCIN, TROUGH
VANCOMYCIN TR: 32 ug/mL — AB (ref 15–20)
Vancomycin Tr: 6 ug/mL — ABNORMAL LOW (ref 15–20)

## 2016-02-27 LAB — HEMOGLOBIN A1C
HEMOGLOBIN A1C: 9.6 % — AB (ref 4.8–5.6)
MEAN PLASMA GLUCOSE: 229 mg/dL

## 2016-02-27 MED ORDER — VANCOMYCIN HCL 10 G IV SOLR
1500.0000 mg | Freq: Three times a day (TID) | INTRAVENOUS | Status: DC
  Filled 2016-02-27 (×2): qty 1500

## 2016-02-27 MED ORDER — VANCOMYCIN HCL 10 G IV SOLR
1750.0000 mg | Freq: Three times a day (TID) | INTRAVENOUS | Status: DC
  Administered 2016-02-28: 1750 mg via INTRAVENOUS
  Filled 2016-02-27 (×2): qty 1750

## 2016-02-27 MED ORDER — INSULIN ASPART 100 UNIT/ML ~~LOC~~ SOLN
4.0000 [IU] | Freq: Three times a day (TID) | SUBCUTANEOUS | Status: DC
  Administered 2016-02-27 – 2016-02-28 (×4): 4 [IU] via SUBCUTANEOUS
  Filled 2016-02-27 (×4): qty 4

## 2016-02-27 MED ORDER — INSULIN GLARGINE 100 UNIT/ML ~~LOC~~ SOLN
25.0000 [IU] | Freq: Every day | SUBCUTANEOUS | Status: DC
  Administered 2016-02-27: 25 [IU] via SUBCUTANEOUS
  Filled 2016-02-27 (×2): qty 0.25

## 2016-02-27 NOTE — Progress Notes (Signed)
Inpatient Diabetes Program Recommendations  AACE/ADA: New Consensus Statement on Inpatient Glycemic Control (2015)  Target Ranges:  Prepandial:   less than 140 mg/dL      Peak postprandial:   less than 180 mg/dL (1-2 hours)      Critically ill patients:  140 - 180 mg/dL   Results for Brandi Myers, Brandi Myers (MRN [id number]) as of 02/27/2016 08:42  Ref. Range 02/26/2016 05:04  Hemoglobin A1C Latest Ref Range: 4.8 - 5.6 % 9.6 (H)   Results for Brandi Myers, Brandi Myers (MRN [id number]) as of 02/27/2016 08:42  Ref. Range 02/26/2016 07:47 02/26/2016 11:29 02/26/2016 16:34 02/26/2016 21:04 02/27/2016 08:02  Glucose-Capillary Latest Ref Range: 65 - 99 mg/dL 132273 (H) 440205 (H) 102301 (H) 183 (H) 256 (H)    Admit with: Abscess/ Cellulitis of Abd Wall  History: DM  Home DM Meds: Lantus 17 units QHS  Current Insulin Orders: Lantus 17 units QHS                                       Novolog Resistant Correction Scale/ SSI (0-20 units) TID AC + HS     Patient eating 100% of meals.  Fasting glucose elevated this AM and pt's CBGs >200 mg/dl all day yesterday.    MD- Please consider the following in-hospital insulin adjustments:  1. If fasting glucose continues to stay elevated, please consider increasing Lantus to 25 units QHS (0.3 units/kg dosing based on weight of 84 kg)  2. Start Novolog Meal Coverage: Novolog 4 units TIDWC  (hold if pt eats <50% of meal)  3. Consider giving patient a Rx for Novolog at time of d/c as her A1c shows poor control at home on Lantus insulin alone (current A1c= 9.6%).  Definitely needs close follow-up with PCP or Endocrinologist      --Will follow patient during hospitalization--  Ambrose FinlandJeannine Johnston Kimberl Vig RN, MSN, CDE Diabetes Coordinator Inpatient Glycemic Control Team Team Pager: [phone] (8a-5p)

## 2016-02-27 NOTE — Progress Notes (Addendum)
Inpatient Diabetes Program Recommendations  AACE/ADA: New Consensus Statement on Inpatient Glycemic Control (2015)  Target Ranges:  Prepandial:   less than 140 mg/dL      Peak postprandial:   less than 180 mg/dL (1-2 hours)      Critically ill patients:  140 - 180 mg/dL    Spoke w/ patient today about her DM care regimen at home.  Patient stated she took Lantus and Novolog while she was pregnant (3 years ago) but then her OBGYN had her stop the Novolog and she's just been taking Lantus ever since.    Spoke with patient about her current A1c of 9.6%.  Explained what an A1c is and what it measures.  Reminded patient that her goal A1c is 7% or less per ADA standards to prevent both acute and long-term complications.  Explained to patient the extreme importance of good glucose control at home.  Encouraged patient to check her CBGs at least tid at home and to record all CBGs in a logbook for her PCP or Endocrinologist to review.  Patient stated she is unhappy with the care she receives at her PCP office and is [facility]   Patient also stated she is willing to start Novolog again at home if needed to help better manage her CBGs.  Explained to pt how Novolog works and that it is often dosed TID at home.  Patient willing to take more injections if needed.   MD- Please place [facility] so pt can follow-up with this clinic after d/c to help her better manage her DM.      --Will follow patient during hospitalization--  Ambrose FinlandJeannine Johnston Nanetta Wiegman RN, MSN, CDE Diabetes Coordinator Inpatient Glycemic Control Team Team Pager: [phone] (8a-5p)

## 2016-02-27 NOTE — Progress Notes (Signed)
Pharmacy Antibiotic Note  Brandi Myers is a 30 y.o. female admitted on 02/25/2016 with cellulitis.  Pharmacy has been consulted for vancomycin dosing.  Plan:  11/15: Vanc level of 32. Dose given 20 min before trough drawn (level drawn 1 hour before dose due). Will order another trough at 1730 before next dose at 1800 today. Called RN and informed her of plan and also entered nursing instruction to ensure level is drawn before dose administration.   11/15 PM: Trough resulting in level of 6, drawn 45 min late.      DW 68kg  Ke: 0.168    T1/2: 5   Vd: 59 Will increase the dose to Vancomycin 1750mg  IV every 8 hours. Goal trough 15-20. Plan for trough prior to 4-5 dose. Monitor renal function.    Height: 5\' 5"  (165.1 cm) Weight: 185 lb (83.9 kg) IBW/kg (Calculated) : 57  Temp (24hrs), Avg:98.2 F (36.8 C), Min:98 F (36.7 C), Max:98.3 F (36.8 C)   Recent Labs Lab 02/25/16 2003 02/26/16 0147 02/26/16 0504 02/27/16 0902 02/27/16 1818  WBC 13.6*  --  11.5*  --   --   CREATININE 0.76  --  0.76 0.56  --   LATICACIDVEN  --  1.5 1.0  --   --   VANCOTROUGH  --   --   --  32* 6*    Estimated Creatinine Clearance: 110.1 mL/min (by C-G formula based on SCr of 0.56 mg/dL).    Allergies  Allergen Reactions  . Norco [Hydrocodone-Acetaminophen] Itching, Nausea And Vomiting and Swelling    Antimicrobials this admission: vancomycin 11/14 >>    >>   Dose adjustments this admission: 11/15 Vancomycin 1gm q8 --> Vanco 1.75g q8   Microbiology results: 11/14 BCx: NGTD  11/13 UA: (-)  Thank you for allowing pharmacy to be a part of this patient's care.  Gardner CandleSheema M Veniamin Kincaid, PharmD Clinical Pharmacist  02/27/2016 7:17 PM

## 2016-02-27 NOTE — Progress Notes (Signed)
Pharmacy Antibiotic Note  Brandi Myers is a 30 y.o. female admitted on 02/25/2016 with cellulitis.  Pharmacy has been consulted for vancomycin dosing.  Plan: DW 68kg  Vd 48L kei 0.096 hr-1  T1/2 7 hours Vancomycin 1 gram q 8 hours ordered. Level before 5th dose. Goal trough 15-20.  11/15: Vanc level of 32. Dose given 20 min before trough drawn (level drawn 1 hour before dose due). Will order another trough at 1730 before next dose at 1800 today. Called RN and informed her of plan and also entered nursing instruction to ensure level is drawn before dose administration.    Height: 5\' 5"  (165.1 cm) Weight: 185 lb (83.9 kg) IBW/kg (Calculated) : 57  Temp (24hrs), Avg:98.4 F (36.9 C), Min:98.2 F (36.8 C), Max:98.6 F (37 C)   Recent Labs Lab 02/25/16 2003 02/26/16 0147 02/26/16 0504 02/27/16 0902  WBC 13.6*  --  11.5*  --   CREATININE 0.76  --  0.76 0.56  LATICACIDVEN  --  1.5 1.0  --   VANCOTROUGH  --   --   --  32*    Estimated Creatinine Clearance: 110.1 mL/min (by C-G formula based on SCr of 0.56 mg/dL).    Allergies  Allergen Reactions  . Norco [Hydrocodone-Acetaminophen] Itching, Nausea And Vomiting and Swelling    Antimicrobials this admission: vancomycin 11/14 >>    >>   Dose adjustments this admission:   Microbiology results: 11/14 BCx: NGTD    11/13 UA: (-)  Thank you for allowing pharmacy to be a part of this patient's care.  Marty HeckWang, Phoua Hoadley L 02/27/2016 9:59 AM

## 2016-02-27 NOTE — Progress Notes (Signed)
[facility] -  at [facility]   PATIENT NAME: Brandi SkeenJessica Myers    MR#:  [id number]  DATE OF BIRTH:  1986/02/17  SUBJECTIVE: 30 year old female patient with history of diabetes admitted for abdominal wall infection. Patient initially thought she had some spider bite and she started using hydrocortisone came, infection spread with small areas of erythema, pustules. Started on IV vancomycin here. She feels little better but still has some tender spots in the lower abdomen with redness,.  CHIEF COMPLAINT:   Chief Complaint  Patient presents with  . Abscess    REVIEW OF SYSTEMS:   ROS CONSTITUTIONAL: No fever, fatigue or weakness.  EYES: No blurred or double vision.  EARS, NOSE, AND THROAT: No tinnitus or ear pain.  RESPIRATORY: No cough, shortness of breath, wheezing or hemoptysis.  CARDIOVASCULAR: No chest pain, orthopnea, edema.  GASTROINTESTINAL: No nausea, vomiting, diarrhea or abdominal pain.  GENITOURINARY: No dysuria, hematuria.  ENDOCRINE: No polyuria, nocturia,  HEMATOLOGY: No anemia, easy bruising or bleeding SKIN: No rash or lesion. MUSCULOSKELETAL: No joint pain or arthritis.   NEUROLOGIC: No tingling, numbness, weakness.  PSYCHIATRY: No anxiety or depression.   DRUG ALLERGIES:   Allergies  Allergen Reactions  . Norco [Hydrocodone-Acetaminophen] Itching, Nausea And Vomiting and Swelling    VITALS:  Blood pressure 129/81, pulse 87, temperature 98 F (36.7 C), temperature source Oral, resp. rate 18, height 5\' 5"  (1.651 m), weight 83.9 kg (185 lb), last menstrual period 02/04/2016, SpO2 100 %.  PHYSICAL EXAMINATION:  GENERAL:  30 y.o.-year-old patient lying in the bed with no acute distress.  EYES: Pupils equal, round, reactive to light and accommodation. No scleral icterus. Extraocular muscles intact.  HEENT: Head atraumatic, normocephalic. Oropharynx and nasopharynx clear.  NECK:  Supple, no jugular venous distention. No thyroid  enlargement, no tenderness.  LUNGS: Normal breath sounds bilaterally, no wheezing, rales,rhonchi or crepitation. No use of accessory muscles of respiration.  CARDIOVASCULAR: S1, S2 normal. No murmurs, rubs, or gallops.  ABDOMEN: Nontender nondistended bowel sounds present. No organomegaly, no CVA tenderness  EXTREMITIES: No pedal edema, cyanosis, or clubbing.  NEUROLOGIC: Cranial nerves II through XII are intact. Muscle strength 5/5 in all extremities. Sensation intact. Gait not checked.  PSYCHIATRIC: The patient is alert and oriented x 3.  SKIN: Skin is warm, erythematous tender areas present in the lower abdominal wall bleeding from progressive 3 cm in diameter   LABORATORY PANEL:   CBC  Recent Labs Lab 02/26/16 0504  WBC 11.5*  HGB 13.5  HCT 39.8  PLT 257   ------------------------------------------------------------------------------------------------------------------  Chemistries   Recent Labs Lab 02/26/16 0504 02/27/16 0902  NA 134*  --   K 3.4*  --   CL 102  --   CO2 25  --   GLUCOSE 289*  --   BUN 10  --   CREATININE 0.76 0.56  CALCIUM 8.5*  --    ------------------------------------------------------------------------------------------------------------------  Cardiac Enzymes No results for input(s): TROPONINI in the last 168 hours. ------------------------------------------------------------------------------------------------------------------  RADIOLOGY:  No results found.  EKG:   Orders placed or performed in visit on 09/12/05  . EKG 12-Lead    ASSESSMENT AND PLAN:   Abdominal wall cellulitis: Patient had tachycardia on admission. WBC is down. But still has her daughter and tender spots in the lower abdomen. Patient is on IV vancomycin. Request ID consult before we discharged the patient. #2 diabetes mellitus type 2: Uncontrolled. Hemoglobin A1c 9.4. Patient is on Lantus, NovoLog. Seen by diabetes coordinator. ad just the  Lantus dose, start  NovoLog 4 units 3 times a day.  #3 tobacco abuse counseled to quit s Patient wanted to go out to get fresh air. I told her that she needs to come back in 10 min. she got angry and says" she is 30 -year-old and that no one can stop her from going out  And if she wanted she would smoke?"but we told her about hospital policy. and not allowed to smoke, yesterday noticed cigars in the room and she told me that  They are her husbands.  All the records are reviewed and case discussed with Care Management/Social Workerr. Management plans discussed with the patient, family and they are in agreement.  CODE STATUS: full  TOTAL TIME TAKING CARE OF THIS PATIENT:35 minutes.   POSSIBLE D/C IN 1-2 DAYS, DEPENDING ON CLINICAL CONDITION.   Katha HammingKONIDENA,Zyrion Coey M.D on 02/27/2016 at 11:25 AM  Between 7am to 6pm - Pager - [phone]  After 6pm go to [email or web address] - password [email or web address] [facility]  [facility]  Office  [phone]  CC: Primary care physician; No PCP Per Patient   Note: This dictation was prepared with Dragon dictation along with smaller phrase technology. Any transcriptional errors that result from this process are unintentional.

## 2016-02-27 NOTE — Consult Note (Signed)
[facility] Infectious Disease     Reason for Consult: Cellulitis   Referring Physician: Governor Specking Date of Admission:  02/25/2016   Principal Problem:   Cellulitis   HPI: Brandi Myers is a 30 y.o. female admitted with abdominal wall redness and pain for one week. She has IDDM, A1c 9.6. On admit wbc 13.6, has been afebrile.  BCX negative.  Per her report she was at a new hotel and thinks she had several bites.  She has not been on any abx. She had some fevers and chills. She follows at [facility] clinic for DM but it is poorly controlled. Denies hx of prior MRSA.  Past Medical History:  Diagnosis Date  . Diabetes mellitus without complication [facility])    Past Surgical History:  Procedure Laterality Date  . CESAREAN SECTION    . CHOLECYSTECTOMY     Social History  Substance Use Topics  . Smoking status: Current Every Day Smoker    Packs/day: 0.10    Types: Cigarettes  . Smokeless tobacco: Never Used  . Alcohol use Yes     Comment: occ.   Family History  Problem Relation Age of Onset  . Diabetes Maternal Grandmother     Allergies:  Allergies  Allergen Reactions  . Norco [Hydrocodone-Acetaminophen] Itching, Nausea And Vomiting and Swelling    Current antibiotics: Antibiotics Given (last 72 hours)    Date/Time Action Medication Dose Rate   02/26/16 0855 Given   vancomycin (VANCOCIN) IVPB 1000 mg/200 mL premix 1,000 mg 200 mL/hr   02/26/16 1731 Given   vancomycin (VANCOCIN) IVPB 1000 mg/200 mL premix 1,000 mg 200 mL/hr   02/27/16 0150 Given   vancomycin (VANCOCIN) IVPB 1000 mg/200 mL premix 1,000 mg 200 mL/hr   02/27/16 0838 Given   vancomycin (VANCOCIN) IVPB 1000 mg/200 mL premix 1,000 mg 200 mL/hr      MEDICATIONS: . enoxaparin (LOVENOX) injection  40 mg Subcutaneous Q24H  . gabapentin  300 mg Oral TID  . insulin aspart  0-20 Units Subcutaneous TID WC  . insulin aspart  0-5 Units Subcutaneous QHS  . insulin aspart  4 Units Subcutaneous TID WC  . insulin  glargine  25 Units Subcutaneous QHS  . vancomycin  1,000 mg Intravenous Q8H    Review of Systems - 11 systems reviewed and negative per HPI   OBJECTIVE: Temp:  [98 F (36.7 C)-98.6 F (37 C)] 98 F (36.7 C) (11/15 1000) Pulse Rate:  [87-92] 87 (11/15 1000) Resp:  [18-20] 18 (11/15 1000) BP: (105-129)/(65-81) 129/81 (11/15 1000) SpO2:  [97 %-100 %] 100 % (11/15 1000) Physical Exam  Constitutional:  oriented to person, place, and time. appears well-developed and well-nourished. No distress. obese HENT: /AT, PERRLA, no scleral icterus Mouth/Throat: Oropharynx is clear and moist. No oropharyngeal exudate.  Cardiovascular: Normal rate, regular rhythm and normal heart sounds.  Pulmonary/Chest: mild rhonchi Neck = supple, no nuchal rigidity Abdominal: Soft. Bowel sounds are normal.  exhibits no distension. There is no tenderness.  Lymphadenopathy: no cervical adenopathy. No axillary adenopathy Neurological: alert and oriented to person, place, and time.  Skin: multiple scabs and boils over abd - largest L mid quadrant with approx 2-3 cm induration and very tender to palp Psychiatric: a normal mood and affect.  behavior is normal.   LABS: Results for orders placed or performed during the hospital encounter of 02/25/16 (from the past 48 hour(s))  Urinalysis complete, with microscopic [facility] only)     Status: Abnormal   Collection Time: 02/25/16  8:03 PM  Result Value Ref Range   Color, Urine STRAW (A) YELLOW   APPearance CLEAR (A) CLEAR   Glucose, UA >500 (A) NEGATIVE mg/dL   Bilirubin Urine NEGATIVE NEGATIVE   Ketones, ur NEGATIVE NEGATIVE mg/dL   Specific Gravity, Urine 1.032 (H) 1.005 - 1.030   Hgb urine dipstick NEGATIVE NEGATIVE   pH 6.0 5.0 - 8.0   Protein, ur NEGATIVE NEGATIVE mg/dL   Nitrite NEGATIVE NEGATIVE   Leukocytes, UA NEGATIVE NEGATIVE   RBC / HPF 0-5 0 - 5 RBC/hpf   WBC, UA 0-5 0 - 5 WBC/hpf   Bacteria, UA NONE SEEN NONE SEEN   Squamous Epithelial / LPF 0-5  (A) NONE SEEN  CBC     Status: Abnormal   Collection Time: 02/25/16  8:03 PM  Result Value Ref Range   WBC 13.6 (H) 3.6 - 11.0 K/uL   RBC 5.64 (H) 3.80 - 5.20 MIL/uL   Hemoglobin 15.2 12.0 - 16.0 g/dL   HCT 46.2 35.0 - 47.0 %   MCV 81.9 80.0 - 100.0 fL   MCH 26.9 26.0 - 34.0 pg   MCHC 32.8 32.0 - 36.0 g/dL   RDW 18.2 (H) 11.5 - 14.5 %   Platelets 283 150 - 440 K/uL  Basic metabolic panel     Status: Abnormal   Collection Time: 02/25/16  8:03 PM  Result Value Ref Range   Sodium 127 (L) 135 - 145 mmol/L   Potassium 3.7 3.5 - 5.1 mmol/L   Chloride 90 (L) 101 - 111 mmol/L   CO2 26 22 - 32 mmol/L   Glucose, Bld 494 (H) 65 - 99 mg/dL   BUN 11 6 - 20 mg/dL   Creatinine, Ser 0.76 0.44 - 1.00 mg/dL   Calcium 8.9 8.9 - 10.3 mg/dL   GFR calc non Af Amer >60 >60 mL/min   GFR calc Af Amer >60 >60 mL/min    Comment: (NOTE) The eGFR has been calculated using the CKD EPI equation. This calculation has not been validated in all clinical situations. eGFR's persistently <60 mL/min signify possible Chronic Kidney Disease.    Anion gap 11 5 - 15  Pregnancy, urine POC     Status: None   Collection Time: 02/25/16  8:14 PM  Result Value Ref Range   Preg Test, Ur NEGATIVE NEGATIVE    Comment:        THE SENSITIVITY OF THIS METHODOLOGY IS >24 mIU/mL   Glucose, capillary     Status: Abnormal   Collection Time: 02/25/16  8:14 PM  Result Value Ref Range   Glucose-Capillary 494 (H) 65 - 99 mg/dL  Glucose, capillary     Status: Abnormal   Collection Time: 02/25/16  8:15 PM  Result Value Ref Range   Glucose-Capillary 538 (HH) 65 - 99 mg/dL  Glucose, capillary     Status: Abnormal   Collection Time: 02/25/16 10:48 PM  Result Value Ref Range   Glucose-Capillary 446 (H) 65 - 99 mg/dL  Glucose, capillary     Status: Abnormal   Collection Time: 02/26/16 12:46 AM  Result Value Ref Range   Glucose-Capillary 335 (H) 65 - 99 mg/dL  Blood culture (routine x 2)     Status: None (Preliminary result)    Collection Time: 02/26/16  1:45 AM  Result Value Ref Range   Specimen Description BLOOD RIGHT HAND    Special Requests      BOTTLES DRAWN AEROBIC AND ANAEROBIC  ANA 12ML AER 11ML  Culture NO GROWTH 1 DAY    Report Status PENDING   Lactic acid, plasma     Status: None   Collection Time: 02/26/16  1:47 AM  Result Value Ref Range   Lactic Acid, Venous 1.5 0.5 - 1.9 mmol/L  Blood culture (routine x 2)     Status: None (Preliminary result)   Collection Time: 02/26/16  1:50 AM  Result Value Ref Range   Specimen Description BLOOD LEFT HAND    Special Requests      BOTTLES DRAWN AEROBIC AND ANAEROBIC  ANA 6ML AER 7ML   Culture NO GROWTH 1 DAY    Report Status PENDING   Glucose, capillary     Status: Abnormal   Collection Time: 02/26/16  3:28 AM  Result Value Ref Range   Glucose-Capillary 301 (H) 65 - 99 mg/dL  Lactic acid, plasma     Status: None   Collection Time: 02/26/16  5:04 AM  Result Value Ref Range   Lactic Acid, Venous 1.0 0.5 - 1.9 mmol/L  Basic metabolic panel     Status: Abnormal   Collection Time: 02/26/16  5:04 AM  Result Value Ref Range   Sodium 134 (L) 135 - 145 mmol/L    Comment: RESULTS VERIFIED BY REPEAT TESTING SNJ   Potassium 3.4 (L) 3.5 - 5.1 mmol/L   Chloride 102 101 - 111 mmol/L   CO2 25 22 - 32 mmol/L   Glucose, Bld 289 (H) 65 - 99 mg/dL   BUN 10 6 - 20 mg/dL   Creatinine, Ser 0.76 0.44 - 1.00 mg/dL   Calcium 8.5 (L) 8.9 - 10.3 mg/dL   GFR calc non Af Amer >60 >60 mL/min   GFR calc Af Amer >60 >60 mL/min    Comment: (NOTE) The eGFR has been calculated using the CKD EPI equation. This calculation has not been validated in all clinical situations. eGFR's persistently <60 mL/min signify possible Chronic Kidney Disease.    Anion gap 7 5 - 15  CBC     Status: Abnormal   Collection Time: 02/26/16  5:04 AM  Result Value Ref Range   WBC 11.5 (H) 3.6 - 11.0 K/uL   RBC 4.92 3.80 - 5.20 MIL/uL   Hemoglobin 13.5 12.0 - 16.0 g/dL   HCT 39.8 35.0 - 47.0 %    MCV 80.9 80.0 - 100.0 fL   MCH 27.3 26.0 - 34.0 pg   MCHC 33.8 32.0 - 36.0 g/dL   RDW 18.0 (H) 11.5 - 14.5 %   Platelets 257 150 - 440 K/uL  Hemoglobin A1c     Status: Abnormal   Collection Time: 02/26/16  5:04 AM  Result Value Ref Range   Hgb A1c MFr Bld 9.6 (H) 4.8 - 5.6 %    Comment: (NOTE)         Pre-diabetes: 5.7 - 6.4         Diabetes: >6.4         Glycemic control for adults with diabetes: <7.0    Mean Plasma Glucose 229 mg/dL    Comment: (NOTE) Performed At: [facility] [address] [place], [place] [id number] Lindon Romp MD UY:[id number]   Glucose, capillary     Status: Abnormal   Collection Time: 02/26/16  7:47 AM  Result Value Ref Range   Glucose-Capillary 273 (H) 65 - 99 mg/dL  Glucose, capillary     Status: Abnormal   Collection Time: 02/26/16 11:29 AM  Result Value Ref Range   Glucose-Capillary 205 (  H) 65 - 99 mg/dL  Glucose, capillary     Status: Abnormal   Collection Time: 02/26/16  4:34 PM  Result Value Ref Range   Glucose-Capillary 301 (H) 65 - 99 mg/dL  Glucose, capillary     Status: Abnormal   Collection Time: 02/26/16  9:04 PM  Result Value Ref Range   Glucose-Capillary 183 (H) 65 - 99 mg/dL  Glucose, capillary     Status: Abnormal   Collection Time: 02/27/16  8:02 AM  Result Value Ref Range   Glucose-Capillary 256 (H) 65 - 99 mg/dL  Vancomycin, trough     Status: Abnormal   Collection Time: 02/27/16  9:02 AM  Result Value Ref Range   Vancomycin Tr 32 (HH) 15 - 20 ug/mL    Comment: CRITICAL RESULT CALLED TO, READ BACK BY AND VERIFIED WITH CHRISTINE KATSOUDAS AT 2426 02/27/16 DAS   Creatinine, serum     Status: None   Collection Time: 02/27/16  9:02 AM  Result Value Ref Range   Creatinine, Ser 0.56 0.44 - 1.00 mg/dL   GFR calc non Af Amer >60 >60 mL/min   GFR calc Af Amer >60 >60 mL/min    Comment: (NOTE) The eGFR has been calculated using the CKD EPI equation. This calculation has not been validated in all clinical  situations. eGFR's persistently <60 mL/min signify possible Chronic Kidney Disease.   Glucose, capillary     Status: Abnormal   Collection Time: 02/27/16 12:09 PM  Result Value Ref Range   Glucose-Capillary 232 (H) 65 - 99 mg/dL   No components found for: ESR, C REACTIVE PROTEIN MICRO: Recent Results (from the past 720 hour(s))  Blood culture (routine x 2)     Status: None (Preliminary result)   Collection Time: 02/26/16  1:45 AM  Result Value Ref Range Status   Specimen Description BLOOD RIGHT HAND  Final   Special Requests   Final    BOTTLES DRAWN AEROBIC AND ANAEROBIC  ANA 12ML AER 11ML   Culture NO GROWTH 1 DAY  Final   Report Status PENDING  Incomplete  Blood culture (routine x 2)     Status: None (Preliminary result)   Collection Time: 02/26/16  1:50 AM  Result Value Ref Range Status   Specimen Description BLOOD LEFT HAND  Final   Special Requests   Final    BOTTLES DRAWN AEROBIC AND ANAEROBIC  ANA 6ML AER 7ML   Culture NO GROWTH 1 DAY  Final   Report Status PENDING  Incomplete    IMAGING: No results found.  Assessment:   Brandi Myers is a 30 y.o. female with poorly controlled DM admitted with boils over abd and elevated WBC. I have cultured one wound today. There is an area of tenderness over one boil but I do not think it needs drainage at this point.  Recommendations Continue vancomycin pending culture results Check HIV  Thank you very much for allowing me to participate in the care of this patient. Please call with questions.   Cheral Marker. Ola Spurr, MD

## 2016-02-28 LAB — CREATININE, SERUM
Creatinine, Ser: 0.62 mg/dL (ref 0.44–1.00)
GFR calc Af Amer: >60 mL/min (ref >60)
GFR calc non Af Amer: >60 mL/min (ref >60)

## 2016-02-28 LAB — GLUCOSE, CAPILLARY
Glucose-Capillary: 150 mg/dL — ABNORMAL HIGH (ref 65–99)
Glucose-Capillary: 133 mg/dL — ABNORMAL HIGH (ref 65–99)

## 2016-02-28 MED ORDER — DOXYCYCLINE HYCLATE 100 MG PO CAPS: 100.0000 mg | ORAL_CAPSULE | Freq: Two times a day (BID) | ORAL | 0 refills | Status: DC

## 2016-02-28 MED ORDER — INSULIN ASPART 100 UNIT/ML ~~LOC~~ SOLN: 0.0000 [IU] | Freq: Three times a day (TID) | SUBCUTANEOUS | 11 refills | Status: DC

## 2016-02-28 MED ORDER — INSULIN ASPART 100 UNIT/ML ~~LOC~~ SOLN: 0.0000 [IU] | Freq: Every day | SUBCUTANEOUS | 11 refills | Status: DC

## 2016-02-28 MED ORDER — INSULIN ASPART 100 UNIT/ML ~~LOC~~ SOLN: 4.0000 [IU] | Freq: Three times a day (TID) | SUBCUTANEOUS | 11 refills | Status: DC

## 2016-02-28 MED ORDER — VANCOMYCIN HCL 10 G IV SOLR
1500.0000 mg | Freq: Three times a day (TID) | INTRAVENOUS | Status: DC
  Administered 2016-02-28: 1500 mg via INTRAVENOUS
  Filled 2016-02-28 (×3): qty 1500

## 2016-02-28 MED ORDER — INSULIN GLARGINE 100 UNIT/ML ~~LOC~~ SOLN: 25.0000 [IU] | Freq: Every day | SUBCUTANEOUS | 11 refills | Status: DC

## 2016-02-28 NOTE — Progress Notes (Signed)
Inpatient Diabetes Program Recommendations  AACE/ADA: New Consensus Statement on Inpatient Glycemic Control (2015)  Target Ranges:  Prepandial:   less than 140 mg/dL      Peak postprandial:   less than 180 mg/dL (1-2 hours)      Critically ill patients:  140 - 180 mg/dL   Lab Results  Component Value Date   GLUCAP 150 (H) 02/28/2016   HGBA1C 9.6 (H) 02/26/2016    Review of Glycemic Control  Results for Brandi MaizesRICE, [name] (MRN [id number]) as of 02/28/2016 09:27  Ref. Range 02/27/2016 08:02 02/27/2016 12:09 02/27/2016 17:10 02/27/2016 21:27 02/28/2016 07:33  Glucose-Capillary Latest Ref Range: 65 - 99 mg/dL 782256 (H) 956232 (H) 213194 (H) 197 (H) 150 (H)    History: DM  Home DM Meds: Lantus 17 units QHS  Current Insulin Orders: Lantus 25 units QHS Novolog Resistant Correction Scale/ SSI (0-20 units) TID AC + HS      Novolog 4 units tid   Patient eating 100% of meals.  Fasting glucose improved this am with 25 units of Lantus last night   MD- Please consider the following in-hospital insulin adjustments:   1. Consider increasing Novolog to 5 units TIDWC  (hold if pt eats <50% of meal)  2. Consider giving patient a Rx for Novolog at time of d/c as her A1c shows poor control at home on Lantus insulin alone (current A1c= 9.6%).  Definitely needs close follow-up with PCP or Endocrinologist  3. Please place [facility] so pt can follow-up with this clinic after d/c to help her better manage her DM.  Susette RacerJulie Terron Merfeld, RN, BA, MHA, CDE Diabetes Coordinator Inpatient Diabetes Program  [phone] (Team Pager) [phone] [facility] Office) 02/28/2016 9:33 AM

## 2016-02-28 NOTE — Progress Notes (Signed)
[facility] NOTE Date of Admission:  02/25/2016     ID: Brandi Myers is a 30 y.o. female with abd wall cellulitis, abscess Principal Problem:   Cellulitis   Subjective: No fevers, less pain at at site  ROS  Eleven systems are reviewed and negative except per hpi  Medications:  Antibiotics Given (last 72 hours)    Date/Time Action Medication Dose Rate   02/26/16 0855 Given   vancomycin (VANCOCIN) IVPB 1000 mg/200 mL premix 1,000 mg 200 mL/hr   02/26/16 1731 Given   vancomycin (VANCOCIN) IVPB 1000 mg/200 mL premix 1,000 mg 200 mL/hr   02/27/16 0150 Given   vancomycin (VANCOCIN) IVPB 1000 mg/200 mL premix 1,000 mg 200 mL/hr   02/27/16 [id number] Given   vancomycin (VANCOCIN) IVPB 1000 mg/200 mL premix 1,000 mg 200 mL/hr   02/27/16 1857 Given   vancomycin (VANCOCIN) IVPB 1000 mg/200 mL premix 1,000 mg 200 mL/hr   02/28/16 0022 Given   vancomycin (VANCOCIN) 1,750 mg in sodium chloride 0.9 % 500 mL IVPB 1,750 mg 250 mL/hr   02/28/16 0845 Given   vancomycin (VANCOCIN) 1,500 mg in sodium chloride 0.9 % 500 mL IVPB 1,500 mg 250 mL/hr     . enoxaparin (LOVENOX) injection  40 mg Subcutaneous Q24H  . gabapentin  300 mg Oral TID  . insulin aspart  0-20 Units Subcutaneous TID WC  . insulin aspart  0-5 Units Subcutaneous QHS  . insulin aspart  4 Units Subcutaneous TID WC  . insulin glargine  25 Units Subcutaneous QHS  . vancomycin  1,500 mg Intravenous Q8H    Objective: Vital signs in last 24 hours: Temp:  [99.1 F (37.3 C)] 99.1 F (37.3 C) (11/15 2235) Pulse Rate:  [92] 92 (11/15 2235) Resp:  [16] 16 (11/15 2235) BP: (115)/(83) 115/83 (11/15 2235) SpO2:  [100 %] 100 % (11/15 2235) Constitutional:  oriented to person, place, and time. appears well-developed and well-nourished. No distress. obese HENT: [place]/AT, PERRLA, no scleral icterus Mouth/Throat: Oropharynx is clear and moist. No oropharyngeal exudate.  Cardiovascular: Normal rate, regular rhythm  and normal heart sounds.  Pulmonary/Chest: mild rhonchi Neck = supple, no nuchal rigidity Abdominal: Soft. Bowel sounds are normal.  exhibits no distension. There is no tenderness.  Lymphadenopathy: no cervical adenopathy. No axillary adenopathy Neurological: alert and oriented to person, place, and time.  Skin: multiple scabs and boils over abd - largest L mid quadrant with approx 2-3 cm induration and very tender to palp Psychiatric: a normal mood and affect.  behavior is normal.   Lab Results  Recent Labs  02/25/16 2003 02/26/16 0504 02/27/16 0902 02/28/16 1028  WBC 13.6* 11.5*  --   --   HGB 15.2 13.5  --   --   HCT 46.2 39.8  --   --   NA 127* 134*  --   --   K 3.7 3.4*  --   --   CL 90* 102  --   --   CO2 26 25  --   --   BUN 11 10  --   --   CREATININE 0.76 0.76 0.56 0.62    Microbiology: Results for orders placed or performed during the hospital encounter of 02/25/16  Blood culture (routine x 2)     Status: None (Preliminary result)   Collection Time: 02/26/16  1:45 AM  Result Value Ref Range Status   Specimen Description BLOOD RIGHT HAND  Final   Special Requests   Final  BOTTLES DRAWN AEROBIC AND ANAEROBIC  ANA 12ML AER 11ML   Culture NO GROWTH 2 DAYS  Final   Report Status PENDING  Incomplete  Blood culture (routine x 2)     Status: None (Preliminary result)   Collection Time: 02/26/16  1:50 AM  Result Value Ref Range Status   Specimen Description BLOOD LEFT HAND  Final   Special Requests   Final    BOTTLES DRAWN AEROBIC AND ANAEROBIC  ANA 6ML AER 7ML   Culture NO GROWTH 2 DAYS  Final   Report Status PENDING  Incomplete  Aerobic/Anaerobic Culture (surgical/deep wound)     Status: None (Preliminary result)   Collection Time: 02/27/16  2:45 PM  Result Value Ref Range Status   Specimen Description ABDOMEN  Final   Special Requests Normal  Final   Gram Stain   Final    NO WBC SEEN NO ORGANISMS SEEN [facility]    Culture PENDING   Incomplete   Report Status PENDING  Incomplete    Studies/Results: No results found.  Assessment/Plan: Brandi Myers is a 30 y.o. female with poorly controlled DM admitted with boils over abd and elevated WBC. Wound culture pending but NGTD. HIV negative. No fevers, wound improving.  Recommendations Can change to oral  Doxy 100 mg bid for 12 more days Can dc home with fu with PCP Advised to use warm compresses as well. Thank you very much for the consult. Will follow with you.  Yeiren Whitecotton P   02/28/2016, 11:51 AM

## 2016-02-28 NOTE — Progress Notes (Signed)
[facility] [place] at [facility]   PATIENT NAME: Brandi SkeenJessica Myers    MR#:  [id number]  DATE OF BIRTH:  [birth year]  SUBJECTIVE: doing well today.no fever.dischrging her home with po doxyxycline,  CHIEF COMPLAINT:   Chief Complaint  Patient presents with  . Abscess    REVIEW OF SYSTEMS:   ROS CONSTITUTIONAL: No fever, fatigue or weakness.  EYES: No blurred or double vision.  EARS, NOSE, AND THROAT: No tinnitus or ear pain.  RESPIRATORY: No cough, shortness of breath, wheezing or hemoptysis.  CARDIOVASCULAR: No chest pain, orthopnea, edema.  GASTROINTESTINAL: No nausea, vomiting, diarrhea or abdominal pain.  GENITOURINARY: No dysuria, hematuria.  ENDOCRINE: No polyuria, nocturia,  HEMATOLOGY: No anemia, easy bruising or bleeding SKIN:cellulitis in lower abdomen. MUSCULOSKELETAL: No joint pain or arthritis.   NEUROLOGIC: No tingling, numbness, weakness.  PSYCHIATRY: No anxiety or depression.   DRUG ALLERGIES:   Allergies  Allergen Reactions  . Norco [Hydrocodone-Acetaminophen] Itching, Nausea And Vomiting and Swelling    VITALS:  Blood pressure 115/83, pulse 92, temperature 99.1 F (37.3 C), temperature source Oral, resp. rate 16, height 5\' 5"  (1.651 m), weight 83.9 kg (185 lb), last menstrual period 02/04/2016, SpO2 100 %.  PHYSICAL EXAMINATION:  GENERAL:  30 y.o.-year-old patient lying in the bed with no acute distress.  EYES: Pupils equal, round, reactive to light and accommodation. No scleral icterus. Extraocular muscles intact.  HEENT: Head atraumatic, normocephalic. Oropharynx and nasopharynx clear.  NECK:  Supple, no jugular venous distention. No thyroid enlargement, no tenderness.  LUNGS: Normal breath sounds bilaterally, no wheezing, rales,rhonchi or crepitation. No use of accessory muscles of respiration.  CARDIOVASCULAR: S1, S2 normal. No murmurs, rubs, or gallops.  ABDOMEN: Nontender nondistended bowel sounds present. No organomegaly,  no CVA tenderness  EXTREMITIES: No pedal edema, cyanosis, or clubbing.  NEUROLOGIC: Cranial nerves II through XII are intact. Muscle strength 5/5 in all extremities. Sensation intact. Gait not checked.  PSYCHIATRIC: The patient is alert and oriented x 3.  SKIN: Skin is warm, erythematous tender areas present in the lower abdominal wall,improvng   LABORATORY PANEL:   CBC  Recent Labs Lab 02/26/16 0504  WBC 11.5*  HGB 13.5  HCT 39.8  PLT 257   ------------------------------------------------------------------------------------------------------------------  Chemistries   Recent Labs Lab 02/26/16 0504  02/28/16 1028  NA 134*  --   --   K 3.4*  --   --   CL 102  --   --   CO2 25  --   --   GLUCOSE 289*  --   --   BUN 10  --   --   CREATININE 0.76  < > 0.62  CALCIUM 8.5*  --   --   < > = values in this interval not displayed. ------------------------------------------------------------------------------------------------------------------  Cardiac Enzymes No results for input(s): TROPONINI in the last 168 hours. ------------------------------------------------------------------------------------------------------------------  RADIOLOGY:  No results found.  EKG:   Orders placed or performed in visit on 09/12/05  . EKG 12-Lead    ASSESSMENT AND PLAN:   Abdominal wall cellulitis:improving;wound cultures NGTD;.discharge her home with po doxycycline as per ID rec,for 12 days. #2 diabetes mellitus type 2: Uncontrolled. Hemoglobin A1c 9.4. Patient is on Lantus, NovoLog. Seen by diabetes coordinator. ad just the Lantus dose, start NovoLog 4 units 3 times a day.  #3 tobacco abuse ;counseled to quit  All the records are reviewed and case discussed with Care Management/Social Workerr. Management plans discussed with the patient, family and they are in  agreement.  CODE STATUS: full  TOTAL TIME TAKING CARE OF THIS PATIENT:35 minutes.   Discharge home  today.   Katha HammingKONIDENA,Ardie Dragoo M.D on 02/28/2016 at 10:46 PM  Between 7am to 6pm - Pager - [phone]  After 6pm go to [email or web address] - password [email or web address] [facility]  [facility]  Office  [phone]  CC: Primary care physician; No PCP Per Patient   Note: This dictation was prepared with Dragon dictation along with smaller phrase technology. Any transcriptional errors that result from this process are unintentional.

## 2016-02-28 NOTE — Progress Notes (Signed)
Discussed discharge instructions and medications with pt. IV removed. All questions addressed. Pt transported home via car by husband.

## 2016-02-28 NOTE — Progress Notes (Addendum)
Pharmacy Antibiotic Note  Brandi MaizesJessica L Myers is a 30 y.o. female admitted on 02/25/2016 with cellulitis.  Pharmacy has been consulted for vancomycin dosing.  Plan: DW 68kg  Vd 48L kei 0.096 hr-1  T1/2 7 hours Vancomycin 1 gram q 8 hours ordered. Level before 5th dose. Goal trough 15-20.  11/15: Vanc level of 32. Dose given 20 min before trough drawn (level drawn 1 hour before dose due). Will order another trough at 1730 before next dose at 1800 today. Called RN and informed her of plan and also entered nursing instruction to ensure level is drawn before dose administration.   11/15 PM: Trough of 6 before 6th dose (at steady state), drawn 45 min late.  Dose increased to vancomycin 1750 mg IV q8h - pt received one dose so far.  Single level kinetics suggests Ke 0.142, half life 5h.  Will decrease dose to vancomycin 1500 mg IV q8h and get trough before 3rd dose of regimen (before 4th dose including 1750 mg dose) to see where we are. Trough will be approaching steady state but not at steady state. Will need to closely follow renal function.    Height: 5\' 5"  (165.1 cm) Weight: 185 lb (83.9 kg) IBW/kg (Calculated) : 57  Temp (24hrs), Avg:98.6 F (37 C), Min:98 F (36.7 C), Max:99.1 F (37.3 C)   Recent Labs Lab 02/25/16 2003 02/26/16 0147 02/26/16 0504 02/27/16 0902 02/27/16 1818  WBC 13.6*  --  11.5*  --   --   CREATININE 0.76  --  0.76 0.56  --   LATICACIDVEN  --  1.5 1.0  --   --   VANCOTROUGH  --   --   --  32* 6*    Estimated Creatinine Clearance: 110.1 mL/min (by C-G formula based on SCr of 0.56 mg/dL).    Allergies  Allergen Reactions  . Norco [Hydrocodone-Acetaminophen] Itching, Nausea And Vomiting and Swelling    Antimicrobials this admission: vancomycin 11/14 >>    >>   Dose adjustments this admission:   Microbiology results: 11/14 BCx: NGTD 11/15: Wound cx from abdomen pending  11/13 UA: (-)  Thank you for allowing pharmacy to be a part of this patient's  care.  Crist FatWang, Adriann Ballweg L 02/28/2016 9:10 AM

## 2016-03-02 LAB — CULTURE, BLOOD (ROUTINE X 2)
CULTURE: NO GROWTH
Culture: NO GROWTH

## 2016-03-03 LAB — AEROBIC/ANAEROBIC CULTURE (SURGICAL/DEEP WOUND): GRAM STAIN: NONE SEEN

## 2016-03-03 LAB — AEROBIC/ANAEROBIC CULTURE W GRAM STAIN (SURGICAL/DEEP WOUND): Special Requests: NORMAL

## 2016-03-04 NOTE — Discharge Summary (Signed)
Brandi Myers, is a 30 y.o. female  DOB 1985/09/26  MRN [id number].  Admission date:  02/25/2016  Admitting Physician  Ihor AustinPavan Pyreddy, MD  Discharge Date:  02/28/2016   Primary MD  No PCP Per Patient  Recommendations for primary care physician for things to follow:   [facility]  In one week   Admission Diagnosis  Cellulitis of abdominal wall [L03.311] Abscess [L02.91]   Discharge Diagnosis  Cellulitis of abdominal wall [L03.311] Abscess [L02.91]    Principal Problem:   Cellulitis      Past Medical History:  Diagnosis Date  . Diabetes mellitus without complication [facility](HCC)     Past Surgical History:  Procedure Laterality Date  . CESAREAN SECTION    . CHOLECYSTECTOMY         History of present illness and  Hospital Course:     Kindly see H&P for history of present illness and admission details, please review complete Labs, Consult reports and Test reports for all details in brief  HPI  from the history and physical done on the day of admission  Brandi Myers  is a 30 y.o. female with a known history of Type 1 diabetes mellitus who is currently on insulin presented to the emergency room because of redness on the abdominal wall as well as the skin abscess. Patient says this has been going on for more than a week and it started when she first stated in a motor vehicle 1 week ago. Patient says she had a insect bite or the abdominal wall but does not know whether it is a spider or any other bug. Initially started as 2 small localized area of infection which later on became red and spreading out. She currently has 3 lesions over the anterior abdominal wall and also redness around and below the umbilicus. There is pain and itching. She also has some multiple scabs and redness over the forearms. No  complaints of any chest pain, shortness of breath. No history of fever or chills. No history of headache dizziness or blurry vision. Bedside lactate level was normal.  Hospital Course  #1. Abdominal wall cellulitis: Started on vancomycin, seen by Dr. Sampson GoonFitzgerald, . Patient had some fever and chills at home. Admission white count is 13.6. Blood cultures negative. When the cultures are done by ID. WBC normalized. HIV test is negative. ID recommended doxycycline 100 mg twice a day for 12 days. And the patient can follow him as an outpatient.  #2. Diabetes mellitus type 2: Hemoglobin A1c 9.6. Poorly controlled. Diabetes coordinator recommended NovoLog 4 units 3 times a day, Lantus 25 units. Daily at bedtime.   3. COPD: No wheezing. Heavy smoker. Counseled to quit.  Discharge Condition: stable   Follow UP  Follow-up Information    FITZGERALD, DAVID P, MD Follow up in 1 week(s).   Specialty:  Infectious Diseases Contact information: [address] [facility] [place] [id number] [phone]             Discharge Instructions  and  Discharge Medications        Medication List    TAKE these medications   doxycycline 100 MG capsule Commonly known as:  VIBRAMYCIN Take 1 capsule (100 mg total) by mouth 2 (two) times daily.   gabapentin 300 MG capsule Commonly known as:  NEURONTIN Take 300 mg by mouth 3 (three) times daily.   insulin aspart 100 UNIT/ML injection Commonly known as:  novoLOG Inject 0-20 Units into the skin 3 (three) times daily with meals.   insulin aspart 100 UNIT/ML injection Commonly known as:  novoLOG Inject 0-5 Units into the skin at bedtime.   insulin aspart 100 UNIT/ML injection Commonly known as:  novoLOG Inject 4 Units into the skin 3 (three) times daily with meals.   insulin glargine 100 UNIT/ML injection Commonly known as:  LANTUS Inject 0.25 mLs (25 Units total) into the skin at bedtime. What changed:   how much to take         Diet and Activity recommendation: See Discharge Instructions above   Consults obtained -  Infectious diseases    Major procedures and Radiology Reports - PLEASE review detailed and final reports for all details, in brief -     No results found.  Micro Results    Recent Results (from the past 240 hour(s))  Blood culture (routine x 2)     Status: None   Collection Time: 02/26/16  1:45 AM  Result Value Ref Range Status   Specimen Description BLOOD RIGHT HAND  Final   Special Requests   Final    BOTTLES DRAWN AEROBIC AND ANAEROBIC  ANA 12ML AER 11ML   Culture NO GROWTH 5 DAYS  Final   Report Status 03/02/2016 FINAL  Final  Blood culture (routine x 2)     Status: None   Collection Time: 02/26/16  1:50 AM  Result Value Ref Range Status   Specimen Description BLOOD LEFT HAND  Final   Special Requests   Final    BOTTLES DRAWN AEROBIC AND ANAEROBIC  ANA 6ML AER 7ML   Culture NO GROWTH 5 DAYS  Final   Report Status 03/02/2016 FINAL  Final  Aerobic/Anaerobic Culture (surgical/deep wound)     Status: None   Collection Time: 02/27/16  2:45 PM  Result Value Ref Range Status   Specimen Description ABDOMEN  Final   Special Requests Normal  Final   Gram Stain NO WBC SEEN NO ORGANISMS SEEN   Final   Culture   Final    FEW STAPHYLOCOCCUS AUREUS NO ANAEROBES ISOLATED [facility]    Report Status 03/03/2016 FINAL  Final   Organism ID, Bacteria STAPHYLOCOCCUS AUREUS  Final      Susceptibility   Staphylococcus aureus - MIC*    CIPROFLOXACIN <=0.5 SENSITIVE Sensitive     ERYTHROMYCIN 0.5 SENSITIVE Sensitive     GENTAMICIN <=0.5 SENSITIVE Sensitive     OXACILLIN <=0.25 SENSITIVE Sensitive     TETRACYCLINE <=1 SENSITIVE Sensitive     VANCOMYCIN 1 SENSITIVE Sensitive     TRIMETH/SULFA <=10 SENSITIVE Sensitive     CLINDAMYCIN <=0.25 SENSITIVE Sensitive     RIFAMPIN <=0.5 SENSITIVE Sensitive     Inducible Clindamycin NEGATIVE Sensitive      * FEW STAPHYLOCOCCUS AUREUS       Today   Subjective:   Brandi SkeenJessica Harnois today has no headache,no chest abdominal pain,no new weakness tingling or numbness, feels much better wants to go home today.   Objective:   Blood pressure 115/83, pulse 92, temperature 99.1 F (37.3 C), temperature source Oral, resp. rate 16, height 5\' 5"  (1.651 m), weight 83.9 kg (185 lb), last menstrual period 02/04/2016, SpO2 100 %.  No intake or output data in the 24 hours ending 03/04/16 1208  Exam Awake Alert, Oriented x 3, No new F.N deficits, Normal affect [place].AT,PERRAL Supple Neck,No JVD, No cervical lymphadenopathy appriciated.  Symmetrical Chest wall movement, Good air  movement bilaterally, CTAB RRR,No Gallops,Rubs or new Murmurs, No Parasternal Heave +ve B.Sounds, Abd Soft, Non tender, No organomegaly appriciated, No rebound -guarding or rigidity.small dry areas of cellultis. No Cyanosis, Clubbing or edema, No new Rash or bruise  Data Review   CBC w Diff:  Lab Results  Component Value Date   WBC 11.5 (H) 02/26/2016   HGB 13.5 02/26/2016   HGB 11.4 (L) 10/22/2012   HCT 39.8 02/26/2016   HCT 31.4 (L) 10/23/2012   PLT 257 02/26/2016   PLT 191 10/22/2012   LYMPHOPCT 19 11/21/2015   LYMPHOPCT 15.7 10/22/2012   MONOPCT 8 11/21/2015   MONOPCT 6.3 10/22/2012   EOSPCT 2 11/21/2015   EOSPCT 0.3 10/22/2012   BASOPCT 1 11/21/2015   BASOPCT 0.4 10/22/2012    CMP:  Lab Results  Component Value Date   NA 134 (L) 02/26/2016   K 3.4 (L) 02/26/2016   CL 102 02/26/2016   CO2 25 02/26/2016   BUN 10 02/26/2016   CREATININE 0.62 02/28/2016  .   Total Time in preparing paper work, data evaluation and todays exam - 35 minutes  Emelio Schneller M.D on 02/28/2016 at 12:08 PM    Note: This dictation was prepared with Dragon dictation along with smaller phrase technology. Any transcriptional errors that result from this process are unintentional.

## 2016-09-07 ENCOUNTER — Encounter: Payer: Self-pay | Admitting: Emergency Medicine

## 2016-09-07 ENCOUNTER — Emergency Department
Admission: EM | Admit: 2016-09-07 | Discharge: 2016-09-07 | Disposition: A | Discharge status: Home / Self Care | Payer: Medicaid Other | Attending: Emergency Medicine | Admitting: Emergency Medicine

## 2016-09-07 DIAGNOSIS — I10 Essential (primary) hypertension: Secondary | ICD-10-CM | POA: Insufficient documentation

## 2016-09-07 DIAGNOSIS — R739 Hyperglycemia, unspecified: Secondary | ICD-10-CM | POA: Insufficient documentation

## 2016-09-07 DIAGNOSIS — F17210 Nicotine dependence, cigarettes, uncomplicated: Secondary | ICD-10-CM | POA: Insufficient documentation

## 2016-09-07 HISTORY — DX: Essential (primary) hypertension: I10

## 2016-09-07 LAB — POCT PREGNANCY, URINE: PREG TEST UR: NEGATIVE

## 2016-09-07 LAB — CBC WITH DIFFERENTIAL/PLATELET
BASOS ABS: 0.1 10*3/uL (ref 0–0.1)
BASOS PCT: 1 %
EOS ABS: 0.4 10*3/uL (ref 0–0.7)
Eosinophils Relative: 4 %
HEMATOCRIT: 41.8 % (ref 35.0–47.0)
Hemoglobin: 14.2 g/dL (ref 12.0–16.0)
Lymphocytes Relative: 30 %
Lymphs Abs: 3.4 10*3/uL (ref 1.0–3.6)
MCH: 27.4 pg (ref 26.0–34.0)
MCHC: 34 g/dL (ref 32.0–36.0)
MCV: 80.6 fL (ref 80.0–100.0)
MONO ABS: 0.8 10*3/uL (ref 0.2–0.9)
Monocytes Relative: 7 %
NEUTROS ABS: 6.7 10*3/uL — AB (ref 1.4–6.5)
Neutrophils Relative %: 58 %
PLATELETS: 229 10*3/uL (ref 150–440)
RBC: 5.19 MIL/uL (ref 3.80–5.20)
RDW: 16.8 % — AB (ref 11.5–14.5)
WBC: 11.6 10*3/uL — ABNORMAL HIGH (ref 3.6–11.0)

## 2016-09-07 LAB — BLOOD GAS, VENOUS
ACID-BASE DEFICIT: 1.5 mmol/L (ref 0.0–2.0)
Bicarbonate: 21.7 mmol/L (ref 20.0–28.0)
O2 Saturation: 88.9 %
PATIENT TEMPERATURE: 37
PH VEN: 7.44 — AB (ref 7.250–7.430)
pCO2, Ven: 32 mmHg — ABNORMAL LOW (ref 44.0–60.0)
pO2, Ven: 54 mmHg — ABNORMAL HIGH (ref 32.0–45.0)

## 2016-09-07 LAB — BASIC METABOLIC PANEL
ANION GAP: 11 (ref 5–15)
BUN: 5 mg/dL — ABNORMAL LOW (ref 6–20)
CALCIUM: 8.8 mg/dL — AB (ref 8.9–10.3)
CO2: 23 mmol/L (ref 22–32)
CREATININE: 0.68 mg/dL (ref 0.44–1.00)
Chloride: 101 mmol/L (ref 101–111)
Glucose, Bld: 388 mg/dL — ABNORMAL HIGH (ref 65–99)
Potassium: 3.3 mmol/L — ABNORMAL LOW (ref 3.5–5.1)
SODIUM: 135 mmol/L (ref 135–145)

## 2016-09-07 LAB — URINALYSIS, COMPLETE (UACMP) WITH MICROSCOPIC
BILIRUBIN URINE: NEGATIVE
HGB URINE DIPSTICK: NEGATIVE
Ketones, ur: 5 mg/dL — AB
LEUKOCYTES UA: NEGATIVE
NITRITE: NEGATIVE
PROTEIN: NEGATIVE mg/dL
SPECIFIC GRAVITY, URINE: 1.032 — AB (ref 1.005–1.030)
pH: 6 (ref 5.0–8.0)

## 2016-09-07 LAB — GLUCOSE, CAPILLARY
GLUCOSE-CAPILLARY: 295 mg/dL — AB (ref 65–99)
GLUCOSE-CAPILLARY: 261 mg/dL — AB (ref 65–99)
Glucose-Capillary: 393 mg/dL — ABNORMAL HIGH (ref 65–99)

## 2016-09-07 MED ORDER — FREESTYLE LANCETS MISC: 12 refills | Status: DC

## 2016-09-07 MED ORDER — SODIUM CHLORIDE 0.9 % IV BOLUS (SEPSIS)
1000.0000 mL | Freq: Once | INTRAVENOUS | Status: AC
  Administered 2016-09-07: 1000 mL via INTRAVENOUS

## 2016-09-07 MED ORDER — FREESTYLE LANCETS MISC: 1 refills | Status: DC

## 2016-09-07 MED ORDER — INSULIN ASPART 100 UNIT/ML ~~LOC~~ SOLN: 4.0000 [IU] | Freq: Three times a day (TID) | SUBCUTANEOUS | 11 refills | Status: DC

## 2016-09-07 MED ORDER — INSULIN GLARGINE 100 UNIT/ML ~~LOC~~ SOLN: 25.0000 [IU] | Freq: Every day | SUBCUTANEOUS | 11 refills | Status: DC

## 2016-09-07 NOTE — ED Notes (Signed)
Iv d/c'd - vs updated - pt given rx's for insulin and for her needles and [facility] and the pamphlet for medication assistance and discount cards for pharmacies.

## 2016-09-07 NOTE — Discharge Instructions (Signed)
Please seek medical attention for any high fevers, chest pain, shortness of breath, change in behavior, persistent vomiting, bloody stool or any other new or concerning symptoms.  

## 2016-09-07 NOTE — ED Provider Notes (Signed)
[facility] Emergency Department Provider Note   ____________________________________________   I have reviewed the triage vital signs and the nursing notes.   HISTORY  Chief Complaint Hyperglycemia   History limited by: Not Limited   HPI Brandi Myers is a 31 y.o. female who presents to the emergency department today because of concerns that her blood sugars might be high. The patient states that she has been feeling dizzy and weak. She has not been able to afford her medication for the past few months because her Medicaid was taken away from her. The patient noticed that the symptoms were worse this morning when she woke up. She did feel dizzy. In addition she has been extremely thirsty recently. She denies any chest pain or abdominal pain. No fevers.   Past Medical History:  Diagnosis Date  . Diabetes mellitus without complication (HCC)   . Hypertension     Patient Active Problem List   Diagnosis Date Noted  . Cellulitis 02/26/2016    Past Surgical History:  Procedure Laterality Date  . CESAREAN SECTION    . CHOLECYSTECTOMY      Prior to Admission medications   Medication Sig Start Date End Date Taking? Authorizing Provider  doxycycline (VIBRAMYCIN) 100 MG capsule Take 1 capsule (100 mg total) by mouth 2 (two) times daily. 02/28/16   Katha Hamming, MD  gabapentin (NEURONTIN) 300 MG capsule Take 300 mg by mouth 3 (three) times daily.    [provider]  insulin aspart (NOVOLOG) 100 UNIT/ML injection Inject 0-20 Units into the skin 3 (three) times daily with meals. 02/28/16   Katha Hamming, MD  insulin aspart (NOVOLOG) 100 UNIT/ML injection Inject 0-5 Units into the skin at bedtime. 02/28/16   Katha Hamming, MD  insulin aspart (NOVOLOG) 100 UNIT/ML injection Inject 4 Units into the skin 3 (three) times daily with meals. 02/28/16   Katha Hamming, MD  insulin glargine (LANTUS) 100 UNIT/ML injection Inject 0.25  mLs (25 Units total) into the skin at bedtime. 02/28/16   Katha Hamming, MD    Allergies Norco [hydrocodone-acetaminophen]  Family History  Problem Relation Age of Onset  . Diabetes Maternal Grandmother     Social History Social History  Substance Use Topics  . Smoking status: Current Every Day Smoker    Packs/day: 0.10    Types: Cigarettes  . Smokeless tobacco: Never Used  . Alcohol use Yes     Comment: occ.    Review of Systems Constitutional: No fever/chills Eyes: No visual changes. ENT: No sore throat. Cardiovascular: Denies chest pain. Respiratory: Denies shortness of breath. Gastrointestinal: No abdominal pain.  No nausea, no vomiting.  No diarrhea.   Genitourinary: Negative for dysuria. Musculoskeletal: Negative for back pain. Skin: Negative for rash. Neurological: Positive for dizziness.   ____________________________________________   PHYSICAL EXAM:  VITAL SIGNS: ED Triage Vitals  Enc Vitals Group     BP 09/07/16 1240 (!) 142/99     Pulse Rate 09/07/16 1240 (!) 114     Resp 09/07/16 1240 18     Temp 09/07/16 1240 98.5 F (36.9 C)     Temp Source 09/07/16 1240 Oral     SpO2 09/07/16 1240 99 %     Weight 09/07/16 1240 200 lb (90.7 kg)     Height 09/07/16 1240 5\' 5"  (1.651 m)     Head Circumference --      Peak Flow --      Pain Score 09/07/16 1242 0   Constitutional: Alert and  oriented. Well appearing and in no distress. Eyes: Conjunctivae are normal.  ENT   Head: Normocephalic and atraumatic.   Nose: No congestion/rhinnorhea.   Mouth/Throat: Mucous membranes are moist.   Neck: No stridor. Hematological/Lymphatic/Immunilogical: No cervical lymphadenopathy. Cardiovascular: Tachycardic, regular rhythm.  No murmurs, rubs, or gallops.  Respiratory: Normal respiratory effort without tachypnea nor retractions. Breath sounds are clear and equal bilaterally. No wheezes/rales/rhonchi. Gastrointestinal: Soft and non tender. No rebound.  No guarding.  Genitourinary: Deferred Musculoskeletal: Normal range of motion in all extremities. No lower extremity edema. Neurologic:  Normal speech and language. No gross focal neurologic deficits are appreciated.  Skin:  Skin is warm, dry and intact. No rash noted. Psychiatric: Mood and affect are normal. Speech and behavior are normal. Patient exhibits appropriate insight and judgment.  ____________________________________________    LABS (pertinent positives/negatives)  Labs Reviewed  GLUCOSE, CAPILLARY - Abnormal; Notable for the following:       Result Value   Glucose-Capillary 393 (*)    All other components within normal limits  URINALYSIS, COMPLETE (UACMP) WITH MICROSCOPIC - Abnormal; Notable for the following:    Color, Urine YELLOW (*)    APPearance CLOUDY (*)    Specific Gravity, Urine 1.032 (*)    Glucose, UA >=500 (*)    Ketones, ur 5 (*)    Bacteria, UA RARE (*)    Squamous Epithelial / LPF 6-30 (*)    All other components within normal limits  BLOOD GAS, VENOUS - Abnormal; Notable for the following:    pH, Ven 7.44 (*)    pCO2, Ven 32 (*)    pO2, Ven 54.0 (*)    All other components within normal limits  CBC WITH DIFFERENTIAL/PLATELET - Abnormal; Notable for the following:    WBC 11.6 (*)    RDW 16.8 (*)    Neutro Abs 6.7 (*)    All other components within normal limits  BASIC METABOLIC PANEL - Abnormal; Notable for the following:    Potassium 3.3 (*)    Glucose, Bld 388 (*)    BUN 5 (*)    Calcium 8.8 (*)    All other components within normal limits  GLUCOSE, CAPILLARY - Abnormal; Notable for the following:    Glucose-Capillary 295 (*)    All other components within normal limits  GLUCOSE, CAPILLARY - Abnormal; Notable for the following:    Glucose-Capillary 261 (*)    All other components within normal limits  POCT PREGNANCY, URINE     ____________________________________________   EKG  None  ____________________________________________     RADIOLOGY  None  ____________________________________________   PROCEDURES  Procedures  ____________________________________________   INITIAL IMPRESSION / ASSESSMENT AND PLAN / ED COURSE  Pertinent labs & imaging results that were available during my care of the patient were reviewed by me and considered in my medical decision making (see chart for details).  Patient presented to the emergency department today because of concerns for hyperglycemia. Patient does have a known history of diabetes however has not had her medication for a couple of months. No findings consistent with DKA in the blood work. Patient's sugars did start to come down after IV fluids. Will give patient prescriptions for her insulin as well as medical management clinic information [facility].  ____________________________________________   FINAL CLINICAL IMPRESSION(S) / ED DIAGNOSES  Final diagnoses:  Hyperglycemia     Note: This dictation was prepared with Dragon dictation. Any transcriptional errors that result from this process are unintentional  Phineas Semen, MD 09/07/16 [id number]

## 2016-09-07 NOTE — ED Triage Notes (Signed)
Pt arrived via POV with c/o not feeling well. Pt states she feels shaky and feels like blood sugar is elevated. Pt states she has not had her diabetes medication in 2 months due to being without medicaid. Denies any pain.

## 2016-09-09 ENCOUNTER — Emergency Department: Payer: Self-pay

## 2016-09-09 ENCOUNTER — Encounter: Payer: Self-pay | Admitting: Emergency Medicine

## 2016-09-09 ENCOUNTER — Emergency Department
Admission: EM | Admit: 2016-09-09 | Discharge: 2016-09-09 | Disposition: A | Discharge status: Home / Self Care | Payer: Self-pay | Attending: Emergency Medicine | Admitting: Emergency Medicine

## 2016-09-09 DIAGNOSIS — Y929 Unspecified place or not applicable: Secondary | ICD-10-CM | POA: Insufficient documentation

## 2016-09-09 DIAGNOSIS — S92535A Nondisplaced fracture of distal phalanx of left lesser toe(s), initial encounter for closed fracture: Secondary | ICD-10-CM | POA: Insufficient documentation

## 2016-09-09 DIAGNOSIS — Y939 Activity, unspecified: Secondary | ICD-10-CM | POA: Insufficient documentation

## 2016-09-09 DIAGNOSIS — W228XXA Striking against or struck by other objects, initial encounter: Secondary | ICD-10-CM | POA: Insufficient documentation

## 2016-09-09 DIAGNOSIS — S91209A Unspecified open wound of unspecified toe(s) with damage to nail, initial encounter: Secondary | ICD-10-CM

## 2016-09-09 DIAGNOSIS — Y999 Unspecified external cause status: Secondary | ICD-10-CM | POA: Insufficient documentation

## 2016-09-09 MED ORDER — TRAMADOL HCL 50 MG PO TABS
50.0000 mg | ORAL_TABLET | Freq: Once | ORAL | Status: AC
  Administered 2016-09-09: 50 mg via ORAL
  Filled 2016-09-09: qty 1

## 2016-09-09 MED ORDER — NEOMYCIN-POLYMYXIN-PRAMOXINE 1 % EX CREA: TOPICAL_CREAM | Freq: Two times a day (BID) | CUTANEOUS | 0 refills | Status: DC

## 2016-09-09 MED ORDER — BACITRACIN-NEOMYCIN-POLYMYXIN 400-5-5000 EX OINT
TOPICAL_OINTMENT | Freq: Once | CUTANEOUS | Status: AC
  Administered 2016-09-09: 1 via TOPICAL
  Filled 2016-09-09: qty 1

## 2016-09-09 MED ORDER — IBUPROFEN 600 MG PO TABS: 600.0000 mg | ORAL_TABLET | Freq: Three times a day (TID) | ORAL | 0 refills | Status: DC | PRN

## 2016-09-09 MED ORDER — LIDOCAINE HCL (PF) 1 % IJ SOLN
INTRAMUSCULAR | Status: AC
  Filled 2016-09-09: qty 5

## 2016-09-09 MED ORDER — IBUPROFEN 600 MG PO TABS
600.0000 mg | ORAL_TABLET | Freq: Once | ORAL | Status: AC
Start: 2016-09-09 — End: 2016-09-09
  Administered 2016-09-09: 600 mg via ORAL
  Filled 2016-09-09: qty 1

## 2016-09-09 MED ORDER — TRAMADOL HCL 50 MG PO TABS: 50.0000 mg | ORAL_TABLET | Freq: Two times a day (BID) | ORAL | 0 refills | Status: DC | PRN

## 2016-09-09 NOTE — ED Provider Notes (Signed)
[facility] Emergency Department Provider Note   ____________________________________________   First MD Initiated Contact with Patient 09/09/16 1706     (approximate)  I have reviewed the triage vital signs and the nursing notes.   HISTORY  Chief Complaint Foot Pain    HPI Brandi Myers is a 31 y.o. female patient complains of pain and partial nail avulsion to the second digit right foot. Patient state complaint is secondary to a stubbing incident prior to arrival.patient rates pain as 8/10. Patient described a pain as "achy". No palliative measures prior to arrival.   Past Medical History:  Diagnosis Date  . Diabetes mellitus without complication (HCC)   . Hypertension     Patient Active Problem List   Diagnosis Date Noted  . Cellulitis 02/26/2016    Past Surgical History:  Procedure Laterality Date  . CESAREAN SECTION    . CHOLECYSTECTOMY      Prior to Admission medications   Medication Sig Start Date End Date Taking? Authorizing Provider  gabapentin (NEURONTIN) 300 MG capsule Take 600 mg by mouth 2 (two) times daily.     [provider]  ibuprofen (ADVIL,MOTRIN) 600 MG tablet Take 1 tablet (600 mg total) by mouth every 8 (eight) hours as needed. 09/09/16   Joni Reining, PA-C  insulin aspart (NOVOLOG) 100 UNIT/ML injection Inject 4 Units into the skin 3 (three) times daily with meals. 09/07/16   Phineas Semen, MD  insulin glargine (LANTUS) 100 UNIT/ML injection Inject 0.25 mLs (25 Units total) into the skin at bedtime. 09/07/16   Phineas Semen, MD  Lancets (FREESTYLE) lancets Use as instructed 09/07/16   Phineas Semen, MD  neomycin-polymyxin-pramoxine (NEOSPORIN PLUS) 1 % cream Apply topically 2 (two) times daily. 09/09/16   Joni Reining, PA-C  traMADol (ULTRAM) 50 MG tablet Take 1 tablet (50 mg total) by mouth every 12 (twelve) hours as needed. 09/09/16   Joni Reining, PA-C    Allergies Norco  [hydrocodone-acetaminophen]  Family History  Problem Relation Age of Onset  . Diabetes Maternal Grandmother     Social History Social History  Substance Use Topics  . Smoking status: Current Every Day Smoker    Packs/day: 0.10    Types: Cigarettes  . Smokeless tobacco: Never Used  . Alcohol use Yes     Comment: occ.    Review of Systems  Constitutional: No fever/chills Eyes: No visual changes. ENT: No sore throat. Cardiovascular: Denies chest pain. Respiratory: Denies shortness of breath. Gastrointestinal: No abdominal pain.  No nausea, no vomiting.  No diarrhea.  No constipation. Genitourinary: Negative for dysuria. Musculoskeletal: pain secondary to right foot Skin: Negative for rash. Partial nail avulsion second digit right foot Neurological: Negative for headaches, focal weakness or numbness. Endocrine:diabetes and hypertension Allergic/Immunilogical: hydrocodone ____________________________________________   PHYSICAL EXAM:  VITAL SIGNS: ED Triage Vitals  Enc Vitals Group     BP 09/09/16 1705 (!) 147/61     Pulse Rate 09/09/16 1705 94     Resp 09/09/16 1705 16     Temp 09/09/16 1705 98.5 F (36.9 C)     Temp Source 09/09/16 1705 Oral     SpO2 09/09/16 1705 99 %     Weight 09/09/16 1701 200 lb (90.7 kg)     Height 09/09/16 1701 5\' 5"  (1.651 m)     Head Circumference --      Peak Flow --      Pain Score 09/09/16 1704 8     Pain  Loc --      Pain Edu? --      Excl. in GC? --     Constitutional: Alert and oriented. Well appearing and in no acute distress. Cardiovascular: Normal rate, regular rhythm. Grossly normal heart sounds.  Good peripheral circulation. Respiratory: Normal respiratory effort.  No retractions. Lungs CTAB. Gastrointestinal: Soft and nontender. No distention. No abdominal bruits. No CVA tenderness. Musculoskeletal: edema secondary to right foot Neurologic:  Normal speech and language. No gross focal neurologic deficits are appreciated.  No gait instability. Skin:  Skin is warm, dry and intact. No rash noted. Partial nail avulsion second digit right foot Psychiatric: Mood and affect are normal. Speech and behavior are normal.  ____________________________________________   LABS (all labs ordered are listed, but only abnormal results are displayed)  Labs Reviewed - No data to display ____________________________________________  EKG   ____________________________________________  RADIOLOGY  X-ray suspicious of a minimal cortical break along the medial margin of the mid phalange secondary to right foot. ____________________________________________   PROCEDURES  Procedure(s) performed: None  Procedures  Critical Care performed: No  ____________________________________________   INITIAL IMPRESSION / ASSESSMENT AND PLAN / ED COURSE  Pertinent labs & imaging results that were available during my care of the patient were reviewed by me and considered in my medical decision making (see chart for details).  Minimal cortical break along the medial margin of the mid phalange secondary to right foot. Nail avulsion second digit right foot. Discuss x-ray finding with patient. Patient will be buddy taped Place an open shoe. Patient [facility] is no improvement or worsening of her condition.      ____________________________________________   FINAL CLINICAL IMPRESSION(S) / ED DIAGNOSES  Final diagnoses:  Nail avulsion of toe, initial encounter  Nondisplaced fracture of distal phalanx of left lesser toe(s), initial encounter for closed fracture      NEW MEDICATIONS STARTED DURING THIS VISIT:  New Prescriptions   IBUPROFEN (ADVIL,MOTRIN) 600 MG TABLET    Take 1 tablet (600 mg total) by mouth every 8 (eight) hours as needed.   NEOMYCIN-POLYMYXIN-PRAMOXINE (NEOSPORIN PLUS) 1 % CREAM    Apply topically 2 (two) times daily.   TRAMADOL (ULTRAM) 50 MG TABLET    Take 1  tablet (50 mg total) by mouth every 12 (twelve) hours as needed.     Note:  This document was prepared using Dragon voice recognition software and may include unintentional dictation errors.    Joni ReiningSmith, Ronald K, PA-C 09/09/16 Cristy [id number]    Stafford, Phillip, MD 09/09/16 2027

## 2016-09-09 NOTE — ED Triage Notes (Signed)
Pt reports to ED w/ c/o L toe pain after injury to second metatarsal.  Pt A/OX4, ambulatory w/o issue, resp even and unlabored.  Dried blood noted to injured area, pt sts that she hit foot last night, toenail intact. NAD

## 2016-12-15 ENCOUNTER — Emergency Department
Admission: EM | Admit: 2016-12-15 | Discharge: 2016-12-15 | Disposition: A | Discharge status: Home / Self Care | Payer: Self-pay | Attending: Emergency Medicine | Admitting: Emergency Medicine

## 2016-12-15 ENCOUNTER — Encounter: Payer: Self-pay | Admitting: Emergency Medicine

## 2016-12-15 ENCOUNTER — Emergency Department: Payer: Self-pay

## 2016-12-15 DIAGNOSIS — Z79899 Other long term (current) drug therapy: Secondary | ICD-10-CM | POA: Insufficient documentation

## 2016-12-15 DIAGNOSIS — Y9319 Activity, other involving water and watercraft: Secondary | ICD-10-CM | POA: Insufficient documentation

## 2016-12-15 DIAGNOSIS — Y999 Unspecified external cause status: Secondary | ICD-10-CM | POA: Insufficient documentation

## 2016-12-15 DIAGNOSIS — I10 Essential (primary) hypertension: Secondary | ICD-10-CM | POA: Insufficient documentation

## 2016-12-15 DIAGNOSIS — S300XXA Contusion of lower back and pelvis, initial encounter: Secondary | ICD-10-CM | POA: Insufficient documentation

## 2016-12-15 DIAGNOSIS — F17210 Nicotine dependence, cigarettes, uncomplicated: Secondary | ICD-10-CM | POA: Insufficient documentation

## 2016-12-15 DIAGNOSIS — E119 Type 2 diabetes mellitus without complications: Secondary | ICD-10-CM | POA: Insufficient documentation

## 2016-12-15 DIAGNOSIS — Y92838 Other recreation area as the place of occurrence of the external cause: Secondary | ICD-10-CM | POA: Insufficient documentation

## 2016-12-15 DIAGNOSIS — M545 Low back pain: Secondary | ICD-10-CM | POA: Insufficient documentation

## 2016-12-15 DIAGNOSIS — Z794 Long term (current) use of insulin: Secondary | ICD-10-CM | POA: Insufficient documentation

## 2016-12-15 DIAGNOSIS — X58XXXA Exposure to other specified factors, initial encounter: Secondary | ICD-10-CM | POA: Insufficient documentation

## 2016-12-15 MED ORDER — CYCLOBENZAPRINE HCL 10 MG PO TABS
10.0000 mg | ORAL_TABLET | Freq: Once | ORAL | Status: AC
  Administered 2016-12-15: 10 mg via ORAL
  Filled 2016-12-15: qty 1

## 2016-12-15 MED ORDER — NAPROXEN 500 MG PO TABS: 500.0000 mg | ORAL_TABLET | Freq: Two times a day (BID) | ORAL | 0 refills | Status: AC

## 2016-12-15 MED ORDER — CYCLOBENZAPRINE HCL 5 MG PO TABS
5.0000 mg | ORAL_TABLET | Freq: Three times a day (TID) | ORAL | 0 refills | Status: DC | PRN
Start: 2016-12-15 — End: 2019-11-02
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
## 2016-12-15 NOTE — ED Notes (Signed)
Pt states her tubes are tied and she is not doing a urine preg
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
## 2017-09-02 ENCOUNTER — Emergency Department: Payer: Self-pay

## 2017-09-02 ENCOUNTER — Emergency Department
Admission: EM | Admit: 2017-09-02 | Discharge: 2017-09-02 | Disposition: A | Discharge status: Home / Self Care | Payer: Self-pay | Attending: Student in an Organized Health Care Education/Training Program | Admitting: Student in an Organized Health Care Education/Training Program

## 2017-09-02 ENCOUNTER — Other Ambulatory Visit: Payer: Self-pay

## 2017-09-02 ENCOUNTER — Encounter: Payer: Self-pay | Admitting: Emergency Medicine

## 2017-09-02 DIAGNOSIS — S91114A Laceration without foreign body of right lesser toe(s) without damage to nail, initial encounter: Secondary | ICD-10-CM | POA: Insufficient documentation

## 2017-09-02 DIAGNOSIS — Z23 Encounter for immunization: Secondary | ICD-10-CM | POA: Insufficient documentation

## 2017-09-02 DIAGNOSIS — Z794 Long term (current) use of insulin: Secondary | ICD-10-CM | POA: Insufficient documentation

## 2017-09-02 DIAGNOSIS — S97121A Crushing injury of right lesser toe(s), initial encounter: Secondary | ICD-10-CM | POA: Insufficient documentation

## 2017-09-02 DIAGNOSIS — I10 Essential (primary) hypertension: Secondary | ICD-10-CM | POA: Insufficient documentation

## 2017-09-02 DIAGNOSIS — W228XXA Striking against or struck by other objects, initial encounter: Secondary | ICD-10-CM | POA: Insufficient documentation

## 2017-09-02 DIAGNOSIS — Y939 Activity, unspecified: Secondary | ICD-10-CM | POA: Insufficient documentation

## 2017-09-02 DIAGNOSIS — E119 Type 2 diabetes mellitus without complications: Secondary | ICD-10-CM | POA: Insufficient documentation

## 2017-09-02 DIAGNOSIS — Y929 Unspecified place or not applicable: Secondary | ICD-10-CM | POA: Insufficient documentation

## 2017-09-02 DIAGNOSIS — Y999 Unspecified external cause status: Secondary | ICD-10-CM | POA: Insufficient documentation

## 2017-09-02 DIAGNOSIS — Z79899 Other long term (current) drug therapy: Secondary | ICD-10-CM | POA: Insufficient documentation

## 2017-09-02 DIAGNOSIS — F17210 Nicotine dependence, cigarettes, uncomplicated: Secondary | ICD-10-CM | POA: Insufficient documentation

## 2017-09-02 MED ORDER — TETANUS-DIPHTH-ACELL PERTUSSIS 5-2.5-18.5 LF-MCG/0.5 IM SUSP
0.5000 mL | Freq: Once | INTRAMUSCULAR | Status: AC
  Administered 2017-09-02: 0.5 mL via INTRAMUSCULAR
  Filled 2017-09-02: qty 0.5

## 2017-09-02 MED ORDER — NAPROXEN 500 MG PO TABS: 500.0000 mg | ORAL_TABLET | Freq: Two times a day (BID) | ORAL | 0 refills | Status: DC

## 2017-09-02 NOTE — ED Triage Notes (Signed)
Pt dropped gallon pickle jar and c/o laceration 5th digit right foot along with small abrasions.  Bleeding controlled. Viewed at first nurse.

## 2017-09-02 NOTE — ED Provider Notes (Signed)
[facility] Emergency Department Provider Note  ____________________________________________  Time seen: Approximately 3:46 PM I have reviewed the triage vital signs and the nursing notes.   HISTORY  Chief Complaint Laceration   HPI Brandi Myers is a 32 y.o. female who presents to the emergency department for treatment and evaluation of right foot pain.  She dropped a pickle jar on her toe last night and has a laceration to the top of the right little toe that has continued to bleed.  She has been unable to get it to stop by using Band-Aids alone.   Past Medical History:  Diagnosis Date  . Diabetes mellitus without complication (HCC)   . Hypertension     Patient Active Problem List   Diagnosis Date Noted  . Cellulitis 02/26/2016    Past Surgical History:  Procedure Laterality Date  . CESAREAN SECTION    . CHOLECYSTECTOMY      Prior to Admission medications   Medication Sig Start Date End Date Taking? Authorizing Provider  cyclobenzaprine (FLEXERIL) 5 MG tablet Take 1 tablet (5 mg total) by mouth 3 (three) times daily as needed for muscle spasms. 12/15/16   Menshew, Charlesetta Ivory, PA-C  gabapentin (NEURONTIN) 300 MG capsule Take 600 mg by mouth 2 (two) times daily.     [provider]  ibuprofen (ADVIL,MOTRIN) 600 MG tablet Take 1 tablet (600 mg total) by mouth every 8 (eight) hours as needed. 09/09/16   Brandi Reining, PA-C  insulin aspart (NOVOLOG) 100 UNIT/ML injection Inject 4 Units into the skin 3 (three) times daily with meals. 09/07/16   Brandi Semen, MD  insulin glargine (LANTUS) 100 UNIT/ML injection Inject 0.25 mLs (25 Units total) into the skin at bedtime. 09/07/16   Brandi Semen, MD  Lancets (FREESTYLE) lancets Use as instructed 09/07/16   Brandi Semen, MD  naproxen (NAPROSYN) 500 MG tablet Take 1 tablet (500 mg total) by mouth 2 (two) times daily with a meal. 09/02/17   Brandi Gish B, FNP  neomycin-polymyxin-pramoxine  (NEOSPORIN PLUS) 1 % cream Apply topically 2 (two) times daily. 09/09/16   Brandi Reining, PA-C  traMADol (ULTRAM) 50 MG tablet Take 1 tablet (50 mg total) by mouth every 12 (twelve) hours as needed. 09/09/16   Brandi Reining, PA-C    Allergies Norco [hydrocodone-acetaminophen]  Family History  Problem Relation Age of Onset  . Diabetes Maternal Grandmother     Social History Social History   Tobacco Use  . Smoking status: Current Every Day Smoker    Packs/day: 0.10    Types: Cigarettes  . Smokeless tobacco: Never Used  Substance Use Topics  . Alcohol use: Yes    Comment: occ.  . Drug use: No    Review of Systems  Constitutional: Negative for fever. Respiratory: Negative for cough or shortness of breath.  Musculoskeletal: Positive for pain in the right little toe Skin: Positive for superficial laceration to the dorsal aspect of the little toe of the right foot. Neurological: Negative for numbness or paresthesias. ____________________________________________   PHYSICAL EXAM:  VITAL SIGNS: ED Triage Vitals  Enc Vitals Group     BP 09/02/17 1529 (!) 155/93     Pulse Rate 09/02/17 1529 (!) 103     Resp 09/02/17 1529 20     Temp 09/02/17 1529 98.7 F (37.1 C)     Temp src --      SpO2 09/02/17 1529 98 %     Weight 09/02/17 1526 200 lb (  90.7 kg)     Height 09/02/17 1526  (1.676 m)     Head Circumference --      Peak Flow --      Pain Score 09/02/17 1526 10     Pain Loc --      Pain Edu? --      Excl. in GC? --      Constitutional: Well appearing. Eyes: Conjunctivae are clear without discharge or drainage. Nose: No rhinorrhea noted. Mouth/Throat: Airway is patent.  Neck: No stridor. Unrestricted range of motion observed.  Cardiovascular: Capillary refill is <3 seconds.  Respiratory: Respirations are even and unlabored.. Musculoskeletal: Unrestricted range of motion observed. Neurologic: Awake, alert, and oriented x 4.  Skin:  0.5cm flap laceration to  the dorsal aspect of little toe of the right foot.  ____________________________________________   LABS (all labs ordered are listed, but only abnormal results are displayed)  Labs Reviewed - No data to display ____________________________________________  EKG  Not indicated ____________________________________________  RADIOLOGY  Image of the right little toe is negative for acute bony abnormality per radiology. ____________________________________________   PROCEDURES  Procedures ____________________________________________   INITIAL IMPRESSION / ASSESSMENT AND PLAN / ED COURSE  Brandi Myers is a 32 y.o. female resents to the emergency department approximately 24 hours after she dropped a pickle jar on her little toe.  Wound was soaked in chlorhexidine scrub and normal saline.  A small strip of Surgicel was placed over the lateral edge of the wound where it had continued to bleed and then Xeroform gauze was placed on top of that with a rolled gauze bandage.  Patient was encouraged to leave this in place for 24 hours unless he gets wet or dirty.  She was encouraged to then remove the bandage and wash the area well.  Tetanus booster was given.  She is to follow-up with the primary care provider for choice for symptoms that are not improving over the next few days.  She was instructed to return to the emergency department for symptoms of change or worsen if she is unable to schedule appointment.  Medications  Tdap (BOOSTRIX) injection 0.5 mL (0.5 mLs Intramuscular Given 09/02/17 1647)     Pertinent labs & imaging results that were available during my care of the patient were reviewed by me and considered in my medical decision making (see chart for details). ____________________________________________   FINAL CLINICAL IMPRESSION(S) / ED DIAGNOSES  Final diagnoses:  Crushing injury of fifth toe of right foot, initial encounter  Laceration of fifth toe of right foot,  initial encounter    ED Discharge Orders        Ordered    naproxen (NAPROSYN) 500 MG tablet  2 times daily with meals     09/02/17 1719       Note:  This document was prepared using Dragon voice recognition software and may include unintentional dictation errors.    Chinita Pester, FNP 09/03/17 0011    Willy Eddy, MD 09/03/17 [phone]

## 2017-11-15 ENCOUNTER — Emergency Department
Admission: EM | Admit: 2017-11-15 | Discharge: 2017-11-15 | Disposition: A | Discharge status: Home / Self Care | Payer: Self-pay | Attending: Emergency Medicine | Admitting: Emergency Medicine

## 2017-11-15 ENCOUNTER — Encounter: Payer: Self-pay | Admitting: Emergency Medicine

## 2017-11-15 DIAGNOSIS — I10 Essential (primary) hypertension: Secondary | ICD-10-CM | POA: Insufficient documentation

## 2017-11-15 DIAGNOSIS — Z79899 Other long term (current) drug therapy: Secondary | ICD-10-CM | POA: Insufficient documentation

## 2017-11-15 DIAGNOSIS — K0889 Other specified disorders of teeth and supporting structures: Secondary | ICD-10-CM | POA: Insufficient documentation

## 2017-11-15 DIAGNOSIS — E119 Type 2 diabetes mellitus without complications: Secondary | ICD-10-CM | POA: Insufficient documentation

## 2017-11-15 DIAGNOSIS — Z794 Long term (current) use of insulin: Secondary | ICD-10-CM | POA: Insufficient documentation

## 2017-11-15 DIAGNOSIS — F17210 Nicotine dependence, cigarettes, uncomplicated: Secondary | ICD-10-CM | POA: Insufficient documentation

## 2017-11-15 MED ORDER — MELOXICAM 15 MG PO TABS: 15.0000 mg | ORAL_TABLET | Freq: Every day | ORAL | 1 refills | Status: AC

## 2017-11-15 MED ORDER — AMOXICILLIN 500 MG PO CAPS
500.0000 mg | ORAL_CAPSULE | Freq: Once | ORAL | Status: AC
  Administered 2017-11-15: 500 mg via ORAL
  Filled 2017-11-15: qty 1

## 2017-11-15 MED ORDER — MELOXICAM 7.5 MG PO TABS
15.0000 mg | ORAL_TABLET | Freq: Every day | ORAL | Status: DC
  Administered 2017-11-15: 15 mg via ORAL

## 2017-11-15 MED ORDER — MELOXICAM 7.5 MG PO TABS
ORAL_TABLET | ORAL | Status: AC
  Filled 2017-11-15: qty 2

## 2017-11-15 MED ORDER — AMOXICILLIN 500 MG PO TABS: 500.0000 mg | ORAL_TABLET | Freq: Three times a day (TID) | ORAL | 0 refills | Status: AC

## 2017-11-15 NOTE — ED Provider Notes (Signed)
[facility] Emergency Department Provider Note  ____________________________________________  Time seen: Approximately 10:47 PM  I have reviewed the triage vital signs and the nursing notes.   HISTORY  Chief Complaint Dental Pain    HPI Brandi Myers is a 32 y.o. female presents to the emergency department with left lower jaw edema and 10/10 aching pain along the distribution of inferior 22 that started today.  Patient denies discharge from the gumline or difficulty swallowing.  No fever or chills.  No pain underneath the tongue.  Patient does not have an appointment with a local dentist due to a lack of dental insurance.  Patient has taken Tylenol but is attempted no other alleviating measures.   Past Medical History:  Diagnosis Date  . Diabetes mellitus without complication (HCC)   . Hypertension     Patient Active Problem List   Diagnosis Date Noted  . Cellulitis 02/26/2016    Past Surgical History:  Procedure Laterality Date  . CESAREAN SECTION    . CHOLECYSTECTOMY      Prior to Admission medications   Medication Sig Start Date End Date Taking? Authorizing Provider  amoxicillin (AMOXIL) 500 MG tablet Take 1 tablet (500 mg total) by mouth 3 (three) times daily for 10 days. 11/15/17 11/25/17  Orvil Feil, PA-C  cyclobenzaprine (FLEXERIL) 5 MG tablet Take 1 tablet (5 mg total) by mouth 3 (three) times daily as needed for muscle spasms. 12/15/16   Menshew, Charlesetta Ivory, PA-C  gabapentin (NEURONTIN) 300 MG capsule Take 600 mg by mouth 2 (two) times daily.     [provider]  ibuprofen (ADVIL,MOTRIN) 600 MG tablet Take 1 tablet (600 mg total) by mouth every 8 (eight) hours as needed. 09/09/16   Joni Reining, PA-C  insulin aspart (NOVOLOG) 100 UNIT/ML injection Inject 4 Units into the skin 3 (three) times daily with meals. 09/07/16   Phineas Semen, MD  insulin glargine (LANTUS) 100 UNIT/ML injection Inject 0.25 mLs (25 Units total) into  the skin at bedtime. 09/07/16   Phineas Semen, MD  Lancets (FREESTYLE) lancets Use as instructed 09/07/16   Phineas Semen, MD  meloxicam (MOBIC) 15 MG tablet Take 1 tablet (15 mg total) by mouth daily for 7 days. 11/15/17 11/22/17  Orvil Feil, PA-C  naproxen (NAPROSYN) 500 MG tablet Take 1 tablet (500 mg total) by mouth 2 (two) times daily with a meal. 09/02/17   Triplett, Cari B, FNP  neomycin-polymyxin-pramoxine (NEOSPORIN PLUS) 1 % cream Apply topically 2 (two) times daily. 09/09/16   Joni Reining, PA-C  traMADol (ULTRAM) 50 MG tablet Take 1 tablet (50 mg total) by mouth every 12 (twelve) hours as needed. 09/09/16   Joni Reining, PA-C    Allergies Norco [hydrocodone-acetaminophen]  Family History  Problem Relation Age of Onset  . Diabetes Maternal Grandmother     Social History Social History   Tobacco Use  . Smoking status: Current Every Day Smoker    Packs/day: 0.10    Types: Cigarettes  . Smokeless tobacco: Never Used  Substance Use Topics  . Alcohol use: Yes    Comment: occ.  . Drug use: No     Review of Systems  Constitutional: No fever/chills Eyes: No visual changes. No discharge ENT: Patient has inferior 22 pain. Cardiovascular: no chest pain. Respiratory: no cough. No SOB. Gastrointestinal: No abdominal pain.  No nausea, no vomiting.  No diarrhea.  No constipation. Genitourinary: Negative for dysuria. No hematuria Musculoskeletal: Negative for musculoskeletal  pain. Skin: Negative for rash, abrasions, lacerations, ecchymosis. Neurological: Negative for headaches, focal weakness or numbness. ____________________________________________   PHYSICAL EXAM:  VITAL SIGNS: ED Triage Vitals [11/15/17 2131]  Enc Vitals Group     BP (!) 144/94     Pulse Rate 94     Resp 18     Temp 98.4 F (36.9 C)     Temp Source Oral     SpO2 99 %     Weight 200 lb (90.7 kg)     Height 5\' 6"  (1.676 m)     Head Circumference      Peak Flow      Pain Score 9      Pain Loc      Pain Edu?      Excl. in GC?      Constitutional: Alert and oriented. Well appearing and in no acute distress. Eyes: Conjunctivae are normal. PERRL. EOMI. Head: Atraumatic. ENT:      Ears: TMs are pearly.      Nose: No congestion/rhinnorhea.      Mouth/Throat: Mucous membranes are moist.  Patient has dental caries affecting inferior 22.  Edema of the left lower jaw visualized.  No pain was elicited with palpation of the under the tongue. Hematological/Lymphatic/Immunilogical: No cervical lymphadenopathy. Cardiovascular: Normal rate, regular rhythm. Normal S1 and S2.  Good peripheral circulation. Respiratory: Normal respiratory effort without tachypnea or retractions. Lungs CTAB. Good air entry to the bases with no decreased or absent breath sounds. Skin:  Skin is warm, dry and intact. No rash noted.   ____________________________________________   LABS (all labs ordered are listed, but only abnormal results are displayed)  Labs Reviewed - No data to display ____________________________________________  EKG   ____________________________________________  RADIOLOGY   No results found.  ____________________________________________    PROCEDURES  Procedure(s) performed:    Procedures    Medications  meloxicam (MOBIC) tablet 15 mg (15 mg Oral Given 11/15/17 2245)  amoxicillin (AMOXIL) capsule 500 mg (500 mg Oral Given 11/15/17 2244)     ____________________________________________   INITIAL IMPRESSION / ASSESSMENT AND PLAN / ED COURSE  Pertinent labs & imaging results that were available during my care of the patient were reviewed by me and considered in my medical decision making (see chart for details).  Review of the [place] CSRS was performed in accordance of the [facility] prior to dispensing any controlled drugs.      Assessment and plan Dental abscess Patient presents to the emergency department with edema of the left lower jaw and dental caries  affecting inferior 22.  Patient was treated empirically with amoxicillin and meloxicam.  She was advised to make an appointment with a local dentist as soon as possible.  All patient questions were answered.     ____________________________________________  FINAL CLINICAL IMPRESSION(S) / ED DIAGNOSES  Final diagnoses:  Pain, dental      NEW MEDICATIONS STARTED DURING THIS VISIT:  ED Discharge Orders        Ordered    amoxicillin (AMOXIL) 500 MG tablet  3 times daily     11/15/17 2231    meloxicam (MOBIC) 15 MG tablet  Daily     11/15/17 2231          This chart was dictated using voice recognition software/Dragon. Despite best efforts to proofread, errors can occur which can change the meaning. Any change was purely unintentional.    Orvil FeilWoods, Shell Blanchette M, PA-C 11/15/17 2251    Phineas SemenGoodman, Graydon, MD 11/19/17 [phone]

## 2017-11-15 NOTE — Discharge Instructions (Signed)
OPTIONS FOR DENTAL FOLLOW UP CARE ° °[place] Department of Health and Human Services - Local Safety Net Dental Clinics °http://www.ncdhhs.gov/dph/oralhealth/services/safetynetclinics.htm °  °[facility] (336-562-3123) ° °[facility] [place] ([phone]) ° °[facility] ([phone] ext [phone]) ° °[facility] ([phone]) ° °[facility] ([phone]) °This clinic caters to the indigent population and is on a lottery system. °Location: °[facility], [address], [place] °Clinic Hours: °Wednesdays from 6pm - 9pm, patients seen by a lottery system. °For dates, call or go to www.med.[facility].edu/[facility]/patients/Dental-[facility] °Services: °Cleanings, fillings and simple extractions. °Payment Options: °DENTAL WORK IS FREE OF CHARGE. Bring proof of income or support. °Best way to get seen: °Arrive at 5:15 pm - this is a lottery, NOT first come/first serve, so arriving earlier will not increase your chances of being seen. °  °  °[facility] °[phone] °Select option 1 for emergencies °  °Location: °[facility], [address], [place] °Clinic Hours: °No walk-ins accepted - call the day before to schedule an appointment. °Check in times are 9:30 am and 1:30 pm. °Services: °Simple extractions, temporary fillings, pulpectomy/pulp debridement, uncomplicated abscess drainage. °Payment Options: °PAYMENT IS DUE AT THE TIME OF SERVICE.  Fee is usually $100-200, additional surgical procedures (e.g. abscess drainage) may be extra. °Cash, checks, Visa/MasterCard accepted.  Can file Medicaid if patient is covered for dental - patient should call case worker to check. °No discount for [facility] patients. °Best way to get seen: °MUST call the day before and get onto the schedule. Can usually be seen the next 1-2 days. No walk-ins accepted. °  °  °[facility] °[phone] °   °Location: °[facility], [address], [place] °Clinic Hours: °M, W, Th, F 8am or 1:30pm, Tues 9a or 1:30 - first come/first served. °Services: °Simple extractions, temporary fillings, uncomplicated abscess drainage.  You do not need to be an [place] resident. °Payment Options: °PAYMENT IS DUE AT THE TIME OF SERVICE. °Dental insurance, otherwise sliding scale - bring proof of income or support. °Depending on income and treatment needed, cost is usually $50-200. °Best way to get seen: °Arrive early as it is first come/first served. °  °  °[facility] °[phone] °  °Location: °[address] °Clinic Hours: °Mon-Thu 8a-5p °Services: °Most basic dental services including extractions and fillings. °Payment Options: °PAYMENT IS DUE AT THE TIME OF SERVICE. °Sliding scale, up to 50% off - bring proof if income or support. °Medicaid with dental option accepted. °Best way to get seen: °Call to schedule an appointment, can usually be seen within 2 weeks OR they will try to see walk-ins - show up at 8a or 2p (you may have to wait). °  °  °[facility] °[phone] °[place] RESIDENTS ONLY °  °Location: °[facility], [address], [place], [place] [address] °Clinic Hours: By appointment only. °Monday - Thursday 8am-5pm, Friday 8am-12pm °Services: Cleanings, fillings, extractions. °Payment Options: °PAYMENT IS DUE AT THE TIME OF SERVICE. °Cash, Visa or MasterCard. Sliding scale - $30 minimum per service. °Best way to get seen: °Come in to office, complete packet and make an appointment - need proof of income °or support monies for each household member and proof of [place] residence. °Usually takes about a month to get in. °  °  °[facility] °[phone] °  °Location: °[address]   [place] °Clinic Hours: Walk-in Urgent Care Dental Services are offered Monday-Friday  mornings only. °The numbers of emergencies accepted daily is limited to the number of °providers available. °Maximum 15 - Mondays, Wednesdays & Thursdays °Maximum 10 - Tuesdays & Fridays °Services: °You do not need to be a [place] resident to be seen for a dental emergency. °Emergencies are defined as pain, swelling, abnormal bleeding, or dental trauma. Walkins will receive x-rays if needed. °NOTE: Dental cleaning is not an emergency. °Payment Options: °PAYMENT IS DUE AT THE TIME OF SERVICE. °Minimum co-pay is $40.00 for uninsured patients. °Minimum co-pay is $3.00 for Medicaid with dental coverage. °Dental Insurance is accepted and must be presented at time of visit. °Medicare does not cover dental. °Forms of payment: Cash, credit card, checks. °Best way to get seen: °If not previously registered with the clinic, walk-in dental registration begins at 7:15 am and is on a first come/first serve basis. °If previously registered with the clinic, call to make an appointment. °  °  °[facility] °[phone] °[place] RESIDENTS ONLY °  °Location: °[address], [place], [place] °Clinic Hours: °Mon-Thu 10a-2p °Services: Extractions only! °Payment Options: °FREE (donations accepted) - bring proof of income or support °Best way to get seen: °Call and schedule an appointment OR come at 8am on the 1st Monday of every month (except for holidays) when it is first come/first served. °  °  °[facility] °[phone] °  °Location: °[address], [place] °Clinic Hours: °Friday mornings °Services, Payment Options, Best way to get seen: °Call for info °

## 2017-11-15 NOTE — ED Triage Notes (Signed)
Patient with complaint of pain to her left lower gums near a broken tooth that started today. Patient states that she started having swelling to her face that has increased throughout the day.

## 2017-11-17 ENCOUNTER — Emergency Department: Payer: Self-pay

## 2017-11-17 ENCOUNTER — Encounter: Payer: Self-pay | Admitting: Medical Oncology

## 2017-11-17 ENCOUNTER — Emergency Department
Admission: EM | Admit: 2017-11-17 | Discharge: 2017-11-17 | Disposition: A | Discharge status: Home / Self Care | Payer: Self-pay | Attending: Student in an Organized Health Care Education/Training Program | Admitting: Student in an Organized Health Care Education/Training Program

## 2017-11-17 DIAGNOSIS — K047 Periapical abscess without sinus: Secondary | ICD-10-CM

## 2017-11-17 DIAGNOSIS — L02811 Cutaneous abscess of head [any part, except face]: Secondary | ICD-10-CM

## 2017-11-17 DIAGNOSIS — L03211 Cellulitis of face: Secondary | ICD-10-CM

## 2017-11-17 DIAGNOSIS — Z794 Long term (current) use of insulin: Secondary | ICD-10-CM | POA: Insufficient documentation

## 2017-11-17 DIAGNOSIS — F17210 Nicotine dependence, cigarettes, uncomplicated: Secondary | ICD-10-CM | POA: Insufficient documentation

## 2017-11-17 DIAGNOSIS — E041 Nontoxic single thyroid nodule: Secondary | ICD-10-CM

## 2017-11-17 DIAGNOSIS — E119 Type 2 diabetes mellitus without complications: Secondary | ICD-10-CM | POA: Insufficient documentation

## 2017-11-17 DIAGNOSIS — Z79899 Other long term (current) drug therapy: Secondary | ICD-10-CM | POA: Insufficient documentation

## 2017-11-17 DIAGNOSIS — I10 Essential (primary) hypertension: Secondary | ICD-10-CM | POA: Insufficient documentation

## 2017-11-17 LAB — CBC
HEMATOCRIT: 40.8 % (ref 35.0–47.0)
Hemoglobin: 13.1 g/dL (ref 12.0–16.0)
MCH: 25.5 pg — ABNORMAL LOW (ref 26.0–34.0)
MCHC: 32.2 g/dL (ref 32.0–36.0)
MCV: 79.3 fL — AB (ref 80.0–100.0)
PLATELETS: 281 10*3/uL (ref 150–440)
RBC: 5.15 MIL/uL (ref 3.80–5.20)
RDW: 16.2 % — AB (ref 11.5–14.5)
WBC: 14 10*3/uL — ABNORMAL HIGH (ref 3.6–11.0)

## 2017-11-17 LAB — BASIC METABOLIC PANEL
ANION GAP: 11 (ref 5–15)
BUN: 7 mg/dL (ref 6–20)
CHLORIDE: 101 mmol/L (ref 98–111)
CO2: 25 mmol/L (ref 22–32)
Calcium: 8.9 mg/dL (ref 8.9–10.3)
Creatinine, Ser: 0.69 mg/dL (ref 0.44–1.00)
Glucose, Bld: 325 mg/dL — ABNORMAL HIGH (ref 70–99)
POTASSIUM: 3.7 mmol/L (ref 3.5–5.1)
SODIUM: 137 mmol/L (ref 135–145)

## 2017-11-17 MED ORDER — CLINDAMYCIN HCL 300 MG PO CAPS: 300.0000 mg | ORAL_CAPSULE | Freq: Four times a day (QID) | ORAL | 0 refills | Status: AC

## 2017-11-17 MED ORDER — KETOROLAC TROMETHAMINE 30 MG/ML IJ SOLN
30.0000 mg | Freq: Once | INTRAMUSCULAR | Status: AC
  Administered 2017-11-17: 30 mg via INTRAVENOUS
  Filled 2017-11-17: qty 1

## 2017-11-17 MED ORDER — CLINDAMYCIN PHOSPHATE 600 MG/50ML IV SOLN
600.0000 mg | Freq: Once | INTRAVENOUS | Status: AC
  Administered 2017-11-17: 600 mg via INTRAVENOUS
  Filled 2017-11-17: qty 50

## 2017-11-17 MED ORDER — IOHEXOL 300 MG/ML  SOLN
75.0000 mL | Freq: Once | INTRAMUSCULAR | Status: AC | PRN
  Administered 2017-11-17: 75 mL via INTRAVENOUS
  Filled 2017-11-17: qty 75

## 2017-11-17 MED ORDER — LIDOCAINE 5 % EX PTCH
MEDICATED_PATCH | CUTANEOUS | Status: AC
  Filled 2017-11-17: qty 1

## 2017-11-17 MED ORDER — LIDOCAINE HCL (PF) 1 % IJ SOLN
INTRAMUSCULAR | Status: AC
  Administered 2017-11-17: 20:00:00
  Filled 2017-11-17: qty 5

## 2017-11-17 NOTE — ED Provider Notes (Signed)
[facility] Emergency Department Provider Note  ____________________________________________  Time seen: Approximately 4:34 PM  I have reviewed the triage vital signs and the nursing notes.   HISTORY  Chief Complaint Abscess    HPI Brandi Myers is a 32 y.o. female that presents to the emergency department for evaluation of  chin pain and swelling for 3 days.  Patient was evaluated in the emergency department 2 days ago and was told that she has a dental abscess and given a prescription for amoxicillin. No dental pain.  Pain to chin has continued to worsen.  She now has blisters over the skin on her chin.  They are draining clear fluid.  No fever, chills.   Past Medical History:  Diagnosis Date  . Diabetes mellitus without complication (HCC)   . Hypertension     Patient Active Problem List   Diagnosis Date Noted  . Cellulitis 02/26/2016    Past Surgical History:  Procedure Laterality Date  . CESAREAN SECTION    . CHOLECYSTECTOMY      Prior to Admission medications   Medication Sig Start Date End Date Taking? Authorizing Provider  amoxicillin (AMOXIL) 500 MG tablet Take 1 tablet (500 mg total) by mouth 3 (three) times daily for 10 days. 11/15/17 11/25/17  Orvil FeilWoods, Jaclyn M, PA-C  clindamycin (CLEOCIN) 300 MG capsule Take 1 capsule (300 mg total) by mouth 4 (four) times daily for 10 days. 11/17/17 11/27/17  Enid DerryWagner, Gerald Kuehl, PA-C  cyclobenzaprine (FLEXERIL) 5 MG tablet Take 1 tablet (5 mg total) by mouth 3 (three) times daily as needed for muscle spasms. 12/15/16   Menshew, Charlesetta IvoryJenise V Bacon, PA-C  gabapentin (NEURONTIN) 300 MG capsule Take 600 mg by mouth 2 (two) times daily.     [provider]  ibuprofen (ADVIL,MOTRIN) 600 MG tablet Take 1 tablet (600 mg total) by mouth every 8 (eight) hours as needed. 09/09/16   Joni ReiningSmith, Ronald K, PA-C  insulin aspart (NOVOLOG) 100 UNIT/ML injection Inject 4 Units into the skin 3 (three) times daily with meals. 09/07/16    Phineas SemenGoodman, Graydon, MD  insulin glargine (LANTUS) 100 UNIT/ML injection Inject 0.25 mLs (25 Units total) into the skin at bedtime. 09/07/16   Phineas SemenGoodman, Graydon, MD  Lancets (FREESTYLE) lancets Use as instructed 09/07/16   Phineas SemenGoodman, Graydon, MD  meloxicam (MOBIC) 15 MG tablet Take 1 tablet (15 mg total) by mouth daily for 7 days. 11/15/17 11/22/17  Orvil FeilWoods, Jaclyn M, PA-C  naproxen (NAPROSYN) 500 MG tablet Take 1 tablet (500 mg total) by mouth 2 (two) times daily with a meal. 09/02/17   Triplett, Cari B, FNP  neomycin-polymyxin-pramoxine (NEOSPORIN PLUS) 1 % cream Apply topically 2 (two) times daily. 09/09/16   Joni ReiningSmith, Ronald K, PA-C  traMADol (ULTRAM) 50 MG tablet Take 1 tablet (50 mg total) by mouth every 12 (twelve) hours as needed. 09/09/16   Joni ReiningSmith, Ronald K, PA-C    Allergies Norco [hydrocodone-acetaminophen]  Family History  Problem Relation Age of Onset  . Diabetes Maternal Grandmother     Social History Social History   Tobacco Use  . Smoking status: Current Every Day Smoker    Packs/day: 0.10    Types: Cigarettes  . Smokeless tobacco: Never Used  Substance Use Topics  . Alcohol use: Yes    Comment: occ.  . Drug use: No     Review of Systems  Constitutional: No fever/chills  Cardiovascular: No chest pain. Respiratory: No SOB. Gastrointestinal:  No nausea, no vomiting.  Musculoskeletal: Negative for musculoskeletal  pain. Skin: Negative for abrasions, lacerations, ecchymosis. Positive for rash. Neurological: Negative for headaches, numbness or tingling    ____________________________________________   PHYSICAL EXAM:  VITAL SIGNS: ED Triage Vitals  Enc Vitals Group     BP 11/17/17 1550 130/79     Pulse Rate 11/17/17 1550 89     Resp 11/17/17 1550 16     Temp 11/17/17 1550 98.7 F (37.1 C)     Temp Source 11/17/17 1550 Oral     SpO2 11/17/17 1550 99 %     Weight 11/17/17 1547 200 lb (90.7 kg)     Height 11/17/17 1547 5\' 6"  (1.676 m)     Head Circumference --      Peak  Flow --      Pain Score 11/17/17 1547 9     Pain Loc --      Pain Edu? --      Excl. in GC? --      Constitutional: Alert and oriented. Well appearing and in no acute distress. Eyes: Conjunctivae are normal. PERRL. EOMI. Head: Atraumatic. ENT:      Ears:      Nose: No congestion/rhinnorhea.      Mouth/Throat: Mucous membranes are moist.  Neck: No stridor.   Cardiovascular: Normal rate, regular rhythm.  Good peripheral circulation. Respiratory: Normal respiratory effort without tachypnea or retractions. Lungs CTAB. Good air entry to the bases with no decreased or absent breath sounds. Musculoskeletal: Full range of motion to all extremities. No gross deformities appreciated. Neurologic:  Normal speech and language. No gross focal neurologic deficits are appreciated.  Skin:  Skin is warm, dry and intact.  Yellow crusting and vesicles to chin with underlying and surrounding induration.  Tenderness to palpation. Psychiatric: Mood and affect are normal. Speech and behavior are normal. Patient exhibits appropriate insight and judgement.   ____________________________________________   LABS (all labs ordered are listed, but only abnormal results are displayed)  Labs Reviewed  CBC - Abnormal; Notable for the following components:      Result Value   WBC 14.0 (*)    MCV 79.3 (*)    MCH 25.5 (*)    RDW 16.2 (*)    All other components within normal limits  BASIC METABOLIC PANEL - Abnormal; Notable for the following components:   Glucose, Bld 325 (*)    All other components within normal limits   ____________________________________________  EKG   ____________________________________________  RADIOLOGY   Ct Maxillofacial W Contrast  Result Date: 11/17/2017 CLINICAL DATA:  Worsening chin abscess.  History of diabetes. EXAM: CT MAXILLOFACIAL WITH CONTRAST TECHNIQUE: Multidetector CT imaging of the maxillofacial structures was performed with intravenous contrast. Multiplanar CT  image reconstructions were also generated. CONTRAST:  75mL OMNIPAQUE IOHEXOL 300 MG/ML  SOLN COMPARISON:  None. FINDINGS: OSSEOUS: The mandible is intact, the condyles are located. No acute facial fracture. No destructive bony lesions. ORBITS: Ocular globes and orbital contents are normal. SINUSES: Small mucosal retention cyst. No paranasal sinus air-fluid levels. Trace mastoid effusions. Nasal septum mildly deviated to the LEFT. Soft tissue effacing RIGHT middle recess. Multiple absent teeth with dental caries. Twenty-two and 23 periapical dental abscess tooth 9 periapical abscess. SOFT TISSUES: Ill-defined 8 x 13 x 17 mm focal fluid contiguous with LEFT parasymphyseal mandible at the site of periapical abscess. Surrounding soft tissue swelling with subcutaneous fat stranding. Small reactive reniform lymph nodes. No edema floor mouth. 18 mm RIGHT thyroid nodule. LIMITED INTRACRANIAL: Normal. IMPRESSION: 1. Teeth 22 and 23 periapical abscesses  with associated 8 x 13 x 17 mm parasymphyseal subperiosteal abscess. Lower face cellulitis. 2. **An incidental finding of potential clinical significance has been found. 18 mm RIGHT thyroid nodule. Recommend NONEMERGENT thyroid ultrasound. This follows ACR consensus guidelines: Managing Incidental Thyroid Nodules Detected on Imaging: White Paper of the ACR Incidental Thyroid Findings Committee. J Am Coll Radiol 2015; 12:143-150.** Electronically Signed   By: Awilda Metro M.D.   On: 11/17/2017 18:50    ____________________________________________    PROCEDURES  Procedure(s) performed:    Procedures  INCISION AND DRAINAGE Performed by: Enid Derry Consent: Verbal consent obtained. Risks and benefits: risks, benefits and alternatives were discussed Type: abscess  Body area: chin  Anesthesia: local infiltration  Incision was made with a scalpel.  Local anesthetic: lidocaine 1 % without epinephrine  Anesthetic total: 5 ml  Complexity:  complex Blunt dissection to break up loculations  Drainage: purulent  Drainage amount: 1 cc  Packing material: none  Patient tolerance: Patient tolerated the procedure well with no immediate complications.    Medications  iohexol (OMNIPAQUE) 300 MG/ML solution 75 mL (75 mLs Intravenous Contrast Given 11/17/17 1818)  clindamycin (CLEOCIN) IVPB 600 mg (0 mg Intravenous Stopped 11/17/17 2003)  lidocaine (LIDODERM) 5 % (  Return to [facility] 11/17/17 2002)  lidocaine (PF) (XYLOCAINE) 1 % injection (  Given by Other 11/17/17 2003)  ketorolac (TORADOL) 30 MG/ML injection 30 mg (30 mg Intravenous Given 11/17/17 1943)     ____________________________________________   INITIAL IMPRESSION / ASSESSMENT AND PLAN / ED COURSE  Pertinent labs & imaging results that were available during my care of the patient were reviewed by me and considered in my medical decision making (see chart for details).  Review of the [place] CSRS was performed in accordance of the [facility] prior to dispensing any controlled drugs.   Patient's diagnosis is consistent with dental abscess, skin abscess, thyroid nodule.  Vital signs and exam are reassuring.  White blood cell count elevated, consistent with infection.  CT was used to evaluate presence of abscess.  CT consistent with dental abscess and skin abscess.  CT also shows thyroid nodules.  Results were discussed with patient and the need to follow-up for outpatient ultrasound.   Skin abscess was drained in ED. Dr. Roxan Hockey was consulted and agrees with plan of care.  IV clindamycin was given.  Patient will be discharged home with prescriptions for clindamycin. Patient is to follow up with dentist and ENT as directed. Patient is given ED precautions to return to the ED for any worsening or new symptoms.     ____________________________________________  FINAL CLINICAL IMPRESSION(S) / ED DIAGNOSES  Final diagnoses:  Dental abscess  Thyroid nodule  Cutaneous abscess of head  excluding face  Cellulitis of face      NEW MEDICATIONS STARTED DURING THIS VISIT:  ED Discharge Orders        Ordered    clindamycin (CLEOCIN) 300 MG capsule  4 times daily     11/17/17 2020          This chart was dictated using voice recognition software/Dragon. Despite best efforts to proofread, errors can occur which can change the meaning. Any change was purely unintentional.    Enid Derry, PA-C 11/17/17 2314    Willy Eddy, MD 11/18/17 [phone]

## 2017-11-17 NOTE — ED Notes (Signed)
See triage note  Presents with abscess area to chin   States she was seen couple of days ago  Area is now larger and more painful

## 2017-11-17 NOTE — Discharge Instructions (Signed)
OPTIONS FOR DENTAL FOLLOW UP CARE ° °[place] Department of Health and Human Services - Local Safety Net Dental Clinics °http://www.ncdhhs.gov/dph/oralhealth/services/safetynetclinics.htm °  °[facility] (336-562-3123) ° °[facility] ([phone]) ° °[facility] ([phone]) ° °[facility] ([phone]) ° °[facility] ([phone]) °This clinic caters to the indigent population and is on a lottery system. °Location: °[facility], [address], [place] °Clinic Hours: °Wednesdays from 6pm - 9pm, patients seen by a lottery system. °For dates, call or go to www.med.[facility].edu/[facility]ac/patients/Dental-[facility] °Services: °Cleanings, fillings and simple extractions. °Payment Options: °DENTAL WORK IS FREE OF CHARGE. Bring proof of income or support. °Best way to get seen: °Arrive at 5:15 pm - this is a lottery, NOT first come/first serve, so arriving earlier will not increase your chances of being seen. °  °  °[facility] °[phone] °Select option 1 for emergencies °  °Location: °[facility], [address], [place] °Clinic Hours: °No walk-ins accepted - call the day before to schedule an appointment. °Check in times are 9:30 am and 1:30 pm. °Services: °Simple extractions, temporary fillings, pulpectomy/pulp debridement, uncomplicated abscess drainage. °Payment Options: °PAYMENT IS DUE AT THE TIME OF SERVICE.  Fee is usually $100-200, additional surgical procedures (e.g. abscess drainage) may be extra. °Cash, checks, Visa/MasterCard accepted.  Can file Medicaid if patient is covered for dental - patient should call case worker to check. °No discount for [facility] patients. °Best way to get seen: °MUST call the day before and get onto the schedule. Can usually be seen the next 1-2 days. No walk-ins accepted. °  °  °[facility] °[phone] °   °Location: °[facility], [address], [place] °Clinic Hours: °M, W, Th, F 8am or 1:30pm, Tues 9a or 1:30 - first come/first served. °Services: °Simple extractions, temporary fillings, uncomplicated abscess drainage.  You do not need to be an [place] resident. °Payment Options: °PAYMENT IS DUE AT THE TIME OF SERVICE. °Dental insurance, otherwise sliding scale - bring proof of income or support. °Depending on income and treatment needed, cost is usually $50-200. °Best way to get seen: °Arrive early as it is first come/first served. °  °  °[facility] °[phone] °  °Location: °[address] °Clinic Hours: °Mon-Thu 8a-5p °Services: °Most basic dental services including extractions and fillings. °Payment Options: °PAYMENT IS DUE AT THE TIME OF SERVICE. °Sliding scale, up to 50% off - bring proof if income or support. °Medicaid with dental option accepted. °Best way to get seen: °Call to schedule an appointment, can usually be seen within 2 weeks OR they will try to see walk-ins - show up at 8a or 2p (you may have to wait). °  °  °[facility] °[phone] °[place] RESIDENTS ONLY °  °Location: °[facility], [address], [place], [place] [address] °Clinic Hours: By appointment only. °Monday - Thursday 8am-5pm, Friday 8am-12pm °Services: Cleanings, fillings, extractions. °Payment Options: °PAYMENT IS DUE AT THE TIME OF SERVICE. °Cash, Visa or MasterCard. Sliding scale - $30 minimum per service. °Best way to get seen: °Come in to office, complete packet and make an appointment - need proof of income °or support monies for each household member and proof of [place] residence. °Usually takes about a month to get in. °  °  °[facility] °[phone] °  °Location: °[address]   [place] °Clinic Hours: Walk-in Urgent Care Dental Services are offered Monday-Friday  mornings only. °The numbers of emergencies accepted daily is limited to the number of °providers available. °Maximum 15 - Mondays, Wednesdays & Thursdays °Maximum 10 - Tuesdays & Fridays °Services: °You do not need to be a [place] resident to be seen for a dental emergency. °Emergencies are defined as pain, swelling, abnormal bleeding, or dental trauma. Walkins will receive x-rays if needed. °NOTE: Dental cleaning is not an emergency. °Payment Options: °PAYMENT IS DUE AT THE TIME OF SERVICE. °Minimum co-pay is $40.00 for uninsured patients. °Minimum co-pay is $3.00 for Medicaid with dental coverage. °Dental Insurance is accepted and must be presented at time of visit. °Medicare does not cover dental. °Forms of payment: Cash, credit card, checks. °Best way to get seen: °If not previously registered with the clinic, walk-in dental registration begins at 7:15 am and is on a first come/first serve basis. °If previously registered with the clinic, call to make an appointment. °  °  °[facility] °[phone] °[place] RESIDENTS ONLY °  °Location: °[address], [place], [place] °Clinic Hours: °Mon-Thu 10a-2p °Services: Extractions only! °Payment Options: °FREE (donations accepted) - bring proof of income or support °Best way to get seen: °Call and schedule an appointment OR come at 8am on the 1st Monday of every month (except for holidays) when it is first come/first served. °  °  °[facility] °[phone] °  °Location: °[address], [place] °Clinic Hours: °Friday mornings °Services, Payment Options, Best way to get seen: °Call for info °

## 2018-01-04 ENCOUNTER — Emergency Department
Admission: EM | Admit: 2018-01-04 | Discharge: 2018-01-04 | Disposition: A | Discharge status: Home / Self Care | Payer: Self-pay | Attending: Emergency Medicine | Admitting: Emergency Medicine

## 2018-01-04 ENCOUNTER — Other Ambulatory Visit: Payer: Self-pay

## 2018-01-04 DIAGNOSIS — Z9049 Acquired absence of other specified parts of digestive tract: Secondary | ICD-10-CM | POA: Insufficient documentation

## 2018-01-04 DIAGNOSIS — I10 Essential (primary) hypertension: Secondary | ICD-10-CM | POA: Insufficient documentation

## 2018-01-04 DIAGNOSIS — E119 Type 2 diabetes mellitus without complications: Secondary | ICD-10-CM | POA: Insufficient documentation

## 2018-01-04 DIAGNOSIS — F17210 Nicotine dependence, cigarettes, uncomplicated: Secondary | ICD-10-CM | POA: Insufficient documentation

## 2018-01-04 DIAGNOSIS — Z79899 Other long term (current) drug therapy: Secondary | ICD-10-CM | POA: Insufficient documentation

## 2018-01-04 DIAGNOSIS — E118 Type 2 diabetes mellitus with unspecified complications: Secondary | ICD-10-CM

## 2018-01-04 DIAGNOSIS — Z794 Long term (current) use of insulin: Secondary | ICD-10-CM | POA: Insufficient documentation

## 2018-01-04 DIAGNOSIS — K047 Periapical abscess without sinus: Secondary | ICD-10-CM

## 2018-01-04 LAB — COMPREHENSIVE METABOLIC PANEL
ALT: 26 U/L (ref 0–44)
AST: 27 U/L (ref 15–41)
Albumin: 3.5 g/dL (ref 3.5–5.0)
Alkaline Phosphatase: 102 U/L (ref 38–126)
Anion gap: 9 (ref 5–15)
BUN: 6 mg/dL (ref 6–20)
CHLORIDE: 101 mmol/L (ref 98–111)
CO2: 25 mmol/L (ref 22–32)
CREATININE: 0.66 mg/dL (ref 0.44–1.00)
Calcium: 8.8 mg/dL — ABNORMAL LOW (ref 8.9–10.3)
GFR calc non Af Amer: >60 mL/min (ref >60)
Glucose, Bld: 295 mg/dL — ABNORMAL HIGH (ref 70–99)
Potassium: 3.2 mmol/L — ABNORMAL LOW (ref 3.5–5.1)
SODIUM: 135 mmol/L (ref 135–145)
Total Bilirubin: 0.6 mg/dL (ref 0.3–1.2)
Total Protein: 7 g/dL (ref 6.5–8.1)

## 2018-01-04 LAB — CBC WITH DIFFERENTIAL/PLATELET
BASOS PCT: 1 %
Basophils Absolute: 0 10*3/uL (ref 0–0.1)
EOS ABS: 0.4 10*3/uL (ref 0–0.7)
EOS PCT: 5 %
HCT: 38.6 % (ref 35.0–47.0)
HEMOGLOBIN: 12.7 g/dL (ref 12.0–16.0)
LYMPHS ABS: 2.4 10*3/uL (ref 1.0–3.6)
Lymphocytes Relative: 29 %
MCH: 25.6 pg — AB (ref 26.0–34.0)
MCHC: 33 g/dL (ref 32.0–36.0)
MCV: 77.7 fL — ABNORMAL LOW (ref 80.0–100.0)
MONO ABS: 0.6 10*3/uL (ref 0.2–0.9)
MONOS PCT: 7 %
Neutro Abs: 5 10*3/uL (ref 1.4–6.5)
Neutrophils Relative %: 58 %
PLATELETS: 232 10*3/uL (ref 150–440)
RBC: 4.97 MIL/uL (ref 3.80–5.20)
RDW: 17.4 % — AB (ref 11.5–14.5)
WBC: 8.5 10*3/uL (ref 3.6–11.0)

## 2018-01-04 MED ORDER — PENICILLIN V POTASSIUM 500 MG PO TABS
500.0000 mg | ORAL_TABLET | Freq: Once | ORAL | Status: AC
  Administered 2018-01-04: 500 mg via ORAL

## 2018-01-04 MED ORDER — METFORMIN HCL 1000 MG PO TABS: 1000.0000 mg | ORAL_TABLET | Freq: Two times a day (BID) | ORAL | 0 refills | Status: DC

## 2018-01-04 MED ORDER — PENICILLIN V POTASSIUM 500 MG PO TABS: 500.0000 mg | ORAL_TABLET | Freq: Four times a day (QID) | ORAL | 0 refills | Status: AC

## 2018-01-04 MED ORDER — BUPIVACAINE HCL (PF) 0.5 % IJ SOLN
INTRAMUSCULAR | Status: DC
Start: 2018-01-04 — End: 2018-01-05
  Filled 2018-01-04: qty 30

## 2018-01-04 MED ORDER — BUPIVACAINE HCL (PF) 0.5 % IJ SOLN
30.0000 mL | Freq: Once | INTRAMUSCULAR | Status: AC
  Administered 2018-01-04: 10 mL

## 2018-01-04 MED ORDER — OXYCODONE-ACETAMINOPHEN 5-325 MG PO TABS: 1.0000 | ORAL_TABLET | ORAL | 0 refills | Status: DC | PRN

## 2018-01-04 MED ORDER — PENICILLIN V POTASSIUM 250 MG PO TABS
ORAL_TABLET | ORAL | Status: AC
  Filled 2018-01-04: qty 2

## 2018-01-04 NOTE — ED Notes (Addendum)
Pt with left sided dental abscess. Hx same

## 2018-01-04 NOTE — ED Provider Notes (Signed)
[facility] Emergency Department Provider Note  ____________________________________________   First MD Initiated Contact with Patient 01/04/18 2044     (approximate)  I have reviewed the triage vital signs and the nursing notes.   HISTORY  Chief Complaint Abscess   HPI Brandi Myers is a 32 y.o. female who comes to the emergency department with several months of worsening left-sided facial pain.  She has not seen a dentist in "a long time" as she does not have health insurance.  She has had chronically poor teeth with chronic dental pain however for the past 2 days it is become acutely worse with swelling to her left submandibular area.  She is able to eat and drink.  She has no shortness of breath.  No fevers or chills.  She also has a past medical history of diabetes mellitus although has not taken any medications in over 1 year as she "cannot afford my insulin".  She does report some polyuria and polydipsia.    Past Medical History:  Diagnosis Date  . Diabetes mellitus without complication (HCC)   . Hypertension     Patient Active Problem List   Diagnosis Date Noted  . Cellulitis 02/26/2016    Past Surgical History:  Procedure Laterality Date  . CESAREAN SECTION    . CHOLECYSTECTOMY      Prior to Admission medications   Medication Sig Start Date End Date Taking? Authorizing Provider  cyclobenzaprine (FLEXERIL) 5 MG tablet Take 1 tablet (5 mg total) by mouth 3 (three) times daily as needed for muscle spasms. 12/15/16   Menshew, Charlesetta IvoryJenise V Bacon, PA-C  gabapentin (NEURONTIN) 300 MG capsule Take 600 mg by mouth 2 (two) times daily.     [provider]  ibuprofen (ADVIL,MOTRIN) 600 MG tablet Take 1 tablet (600 mg total) by mouth every 8 (eight) hours as needed. 09/09/16   Joni ReiningSmith, Ronald K, PA-C  insulin aspart (NOVOLOG) 100 UNIT/ML injection Inject 4 Units into the skin 3 (three) times daily with meals. 09/07/16   Phineas SemenGoodman, Graydon, MD  insulin  glargine (LANTUS) 100 UNIT/ML injection Inject 0.25 mLs (25 Units total) into the skin at bedtime. 09/07/16   Phineas SemenGoodman, Graydon, MD  Lancets (FREESTYLE) lancets Use as instructed 09/07/16   Phineas SemenGoodman, Graydon, MD  metFORMIN (GLUCOPHAGE) 1000 MG tablet Take 1 tablet (1,000 mg total) by mouth 2 (two) times daily with a meal. 01/04/18 01/04/19  Merrily Brittleifenbark, Arnie Clingenpeel, MD  naproxen (NAPROSYN) 500 MG tablet Take 1 tablet (500 mg total) by mouth 2 (two) times daily with a meal. 09/02/17   Triplett, Cari B, FNP  neomycin-polymyxin-pramoxine (NEOSPORIN PLUS) 1 % cream Apply topically 2 (two) times daily. 09/09/16   Joni ReiningSmith, Ronald K, PA-C  oxyCODONE-acetaminophen (PERCOCET/ROXICET) 5-325 MG tablet Take 1 tablet by mouth every 4 (four) hours as needed for severe pain. 01/04/18   Merrily Brittleifenbark, Dalphine Cowie, MD  penicillin v potassium (VEETID) 500 MG tablet Take 1 tablet (500 mg total) by mouth 4 (four) times daily for 10 days. 01/04/18 01/14/18  Merrily Brittleifenbark, Samora Jernberg, MD  traMADol (ULTRAM) 50 MG tablet Take 1 tablet (50 mg total) by mouth every 12 (twelve) hours as needed. 09/09/16   Joni ReiningSmith, Ronald K, PA-C    Allergies Norco [hydrocodone-acetaminophen]  Family History  Problem Relation Age of Onset  . Diabetes Maternal Grandmother     Social History Social History   Tobacco Use  . Smoking status: Current Every Day Smoker    Packs/day: 0.10    Types: Cigarettes  .  Smokeless tobacco: Never Used  Substance Use Topics  . Alcohol use: Yes    Comment: occ.  . Drug use: No    Review of Systems Constitutional: No fever/chills ENT: Positive for dental pain Cardiovascular: Denies chest pain. Respiratory: Denies shortness of breath. Gastrointestinal: No abdominal pain.  No nausea, no vomiting.   Musculoskeletal: Negative for back pain. Neurological: Negative for headaches   ____________________________________________   PHYSICAL EXAM:  VITAL SIGNS: ED Triage Vitals [01/04/18 2001]  Enc Vitals Group     BP 132/82     Pulse  Rate 96     Resp 20     Temp 98.2 F (36.8 C)     Temp Source Oral     SpO2 100 %     Weight 196 lb (88.9 kg)     Height 5\' 5"  (1.651 m)     Head Circumference      Peak Flow      Pain Score 10     Pain Loc      Pain Edu?      Excl. in GC?     Constitutional: Alert and oriented x4 tearful although nontoxic no diaphoresis Head: Atraumatic. Mouth/Throat: No trismus uvula midline no pharyngeal erythema or exudate.  Extremely poor dentition missing a large number of teeth.  She has mild submandibular swelling on the left No sublingual or submental induration tenderness or woodiness.  No evidence of Ludwig angina Neck: No stridor.  No meningismus Cardiovascular: Regular rate and rhythm Respiratory: Normal respiratory effort.  No retractions. Neurologic:  Normal speech and language. No gross focal neurologic deficits are appreciated.  Skin:  Skin is warm, dry and intact. No rash noted.    ____________________________________________  LABS (all labs ordered are listed, but only abnormal results are displayed)  Labs Reviewed  COMPREHENSIVE METABOLIC PANEL - Abnormal; Notable for the following components:      Result Value   Potassium 3.2 (*)    Glucose, Bld 295 (*)    Calcium 8.8 (*)    All other components within normal limits  CBC WITH DIFFERENTIAL/PLATELET - Abnormal; Notable for the following components:   MCV 77.7 (*)    MCH 25.6 (*)    RDW 17.4 (*)    All other components within normal limits  HEMOGLOBIN A1C - Abnormal; Notable for the following components:   Hgb A1c MFr Bld 11.1 (*)    All other components within normal limits    Lab work reviewed by me shows elevated glucose but no evidence of DKA __________________________________________  EKG   ____________________________________________  RADIOLOGY   ____________________________________________   DIFFERENTIAL includes but not limited to  Ludewig's angina, dental infection, dental abscess,  DKA   PROCEDURES  Procedure(s) performed: Yes  .Nerve Block Date/Time: 01/05/2018 1:39 AM Performed by: Merrily Brittle, MD Authorized by: Merrily Brittle, MD   Consent:    Consent obtained:  Verbal   Consent given by:  Patient   Risks discussed:  Allergic reaction, intravenous injection, infection, pain, nerve damage and unsuccessful block   Alternatives discussed:  Alternative treatment Indications:    Indications:  Pain relief Location:    Body area:  Head   Head nerve blocked: Left infra alveolar nerve.   Laterality:  Left Procedure details (see MAR for exact dosages):    Block needle gauge:  25 G   Anesthetic injected:  Bupivacaine 0.5% w/o epi   Steroid injected:  None   Injection procedure:  Anatomic landmarks identified, anatomic landmarks palpated, incremental  injection and negative aspiration for blood   Paresthesia:  Immediately resolved Post-procedure details:    Dressing:  None   Outcome:  Anesthesia achieved   Patient tolerance of procedure:  Tolerated well, no immediate complications    Critical Care performed: no  ____________________________________________   INITIAL IMPRESSION / ASSESSMENT AND PLAN / ED COURSE  Pertinent labs & imaging results that were available during my care of the patient were reviewed by me and considered in my medical decision making (see chart for details).   As part of my medical decision making, I reviewed the following data within the electronic MEDICAL RECORD NUMBER History obtained from family if available, nursing notes, old chart and ekg, as well as notes from prior ED visits.  The patient comes to the emergency department with an obvious dental infection although nothing that I am easily able to drain.  I consented her for dental block and performed with bupivacaine with complete anesthesia.  She has no evidence of Ludwig's angina.  Given that she has been off her medications for 1 year I obtained blood work showing elevated  sugar although no evidence of DKA.  We discussed that insulin is quite expensive however metformin is extremely inexpensive.  I will initiate her on twice daily metformin for a month as well as Pen-Vee K and pain medication.  I appreciate her reported allergy to Norco although she says she can take Percocet with no issues.  I referred her to the dental clinic.  Strict return precautions have been given.      ____________________________________________   FINAL CLINICAL IMPRESSION(S) / ED DIAGNOSES  Final diagnoses:  Dental infection  Type 2 diabetes mellitus with complication, unspecified whether long term insulin use (HCC)      NEW MEDICATIONS STARTED DURING THIS VISIT:  Discharge Medication List as of 01/04/2018  9:48 PM    START taking these medications   Details  metFORMIN (GLUCOPHAGE) 1000 MG tablet Take 1 tablet (1,000 mg total) by mouth 2 (two) times daily with a meal., Starting Mon 01/04/2018, Until Tue 01/04/2019, Print    oxyCODONE-acetaminophen (PERCOCET/ROXICET) 5-325 MG tablet Take 1 tablet by mouth every 4 (four) hours as needed for severe pain., Starting Mon 01/04/2018, Print    penicillin v potassium (VEETID) 500 MG tablet Take 1 tablet (500 mg total) by mouth 4 (four) times daily for 10 days., Starting Mon 01/04/2018, Until Thu 01/14/2018, Print         Note:  This document was prepared using Dragon voice recognition software and may include unintentional dictation errors.      Merrily Brittle, MD 01/05/18 1558

## 2018-01-04 NOTE — ED Triage Notes (Signed)
Pt in with co dental abscess x 3 days, swelling to left jaw noted.

## 2018-01-04 NOTE — ED Notes (Signed)

## 2018-01-04 NOTE — Discharge Instructions (Signed)
Please take all of your antibiotics as prescribed and make sure you follow up with a dentist within 1 week at the very latest for a recheck.  Return to the ED sooner for any concerns particularly if you have difficulty swallowing, talking, if you're drooling, or develop fevers or chills.  It was a pleasure to take care of you today, and thank you for coming to our emergency department.  If you have any questions or concerns before leaving please ask the nurse to grab me and I'm more than happy to go through your aftercare instructions again.  If you were prescribed any opioid pain medication today such as Norco, Vicodin, Percocet, morphine, hydrocodone, or oxycodone please make sure you do not drive when you are taking this medication as it can alter your ability to drive safely.  If you have any concerns once you are home that you are not improving or are in fact getting worse before you can make it to your follow-up appointment, please do not hesitate to call [phone] and come back for further evaluation.  Merrily BrittleNeil Jayani Rozman, MD  Results for orders placed or performed during the hospital encounter of 01/04/18  Comprehensive metabolic panel  Result Value Ref Range   Sodium 135 135 - 145 mmol/L   Potassium 3.2 (L) 3.5 - 5.1 mmol/L   Chloride 101 98 - 111 mmol/L   CO2 25 22 - 32 mmol/L   Glucose, Bld 295 (H) 70 - 99 mg/dL   BUN 6 6 - 20 mg/dL   Creatinine, Ser 8.110.66 0.44 - 1.00 mg/dL   Calcium 8.8 (L) 8.9 - 10.3 mg/dL   Total Protein 7.0 6.5 - 8.1 g/dL   Albumin 3.5 3.5 - 5.0 g/dL   AST 27 15 - 41 U/L   ALT 26 0 - 44 U/L   Alkaline Phosphatase 102 38 - 126 U/L   Total Bilirubin 0.6 0.3 - 1.2 mg/dL   GFR calc non Af Amer >60 >60 mL/min   GFR calc Af Amer >60 >60 mL/min   Anion gap 9 5 - 15  CBC with Differential  Result Value Ref Range   WBC 8.5 3.6 - 11.0 K/uL   RBC 4.97 3.80 - 5.20 MIL/uL   Hemoglobin 12.7 12.0 - 16.0 g/dL   HCT 91.438.6 78.235.0 - 95.647.0 %   MCV 77.7 (L) 80.0 - 100.0 fL   MCH 25.6  (L) 26.0 - 34.0 pg   MCHC 33.0 32.0 - 36.0 g/dL   RDW 21.317.4 (H) 08.611.5 - 57.814.5 %   Platelets 232 150 - 440 K/uL   Neutrophils Relative % 58 %   Neutro Abs 5.0 1.4 - 6.5 K/uL   Lymphocytes Relative 29 %   Lymphs Abs 2.4 1.0 - 3.6 K/uL   Monocytes Relative 7 %   Monocytes Absolute 0.6 0.2 - 0.9 K/uL   Eosinophils Relative 5 %   Eosinophils Absolute 0.4 0 - 0.7 K/uL   Basophils Relative 1 %   Basophils Absolute 0.0 0 - 0.1 K/uL

## 2018-01-05 LAB — HEMOGLOBIN A1C
Hgb A1c MFr Bld: 11.1 % — ABNORMAL HIGH (ref 4.8–5.6)
Mean Plasma Glucose: 271.87 mg/dL

## 2018-01-06 ENCOUNTER — Telehealth: Payer: Self-pay | Admitting: Emergency Medicine

## 2018-01-06 NOTE — Telephone Encounter (Signed)
Called patient to inform of A1C result.  Call would not complete.

## 2018-08-26 ENCOUNTER — Emergency Department
Admission: EM | Admit: 2018-08-26 | Discharge: 2018-08-26 | Disposition: A | Discharge status: Home / Self Care | Payer: Medicaid Other | Attending: Emergency Medicine | Admitting: Emergency Medicine

## 2018-08-26 ENCOUNTER — Other Ambulatory Visit: Payer: Self-pay

## 2018-08-26 ENCOUNTER — Encounter: Payer: Self-pay | Admitting: Emergency Medicine

## 2018-08-26 DIAGNOSIS — K047 Periapical abscess without sinus: Secondary | ICD-10-CM | POA: Diagnosis not present

## 2018-08-26 DIAGNOSIS — Z794 Long term (current) use of insulin: Secondary | ICD-10-CM | POA: Insufficient documentation

## 2018-08-26 DIAGNOSIS — I10 Essential (primary) hypertension: Secondary | ICD-10-CM | POA: Diagnosis not present

## 2018-08-26 DIAGNOSIS — E119 Type 2 diabetes mellitus without complications: Secondary | ICD-10-CM | POA: Diagnosis not present

## 2018-08-26 DIAGNOSIS — K0889 Other specified disorders of teeth and supporting structures: Secondary | ICD-10-CM | POA: Diagnosis present

## 2018-08-26 DIAGNOSIS — F17210 Nicotine dependence, cigarettes, uncomplicated: Secondary | ICD-10-CM | POA: Insufficient documentation

## 2018-08-26 MED ORDER — AMOXICILLIN 500 MG PO CAPS
500.0000 mg | ORAL_CAPSULE | Freq: Once | ORAL | Status: AC
  Administered 2018-08-26: 500 mg via ORAL
  Filled 2018-08-26: qty 1

## 2018-08-26 MED ORDER — OXYCODONE-ACETAMINOPHEN 5-325 MG PO TABS
1.0000 | ORAL_TABLET | Freq: Once | ORAL | Status: AC
  Administered 2018-08-26: 16:00:00 1 via ORAL
  Filled 2018-08-26: qty 1

## 2018-08-26 MED ORDER — KETOROLAC TROMETHAMINE 30 MG/ML IJ SOLN
30.0000 mg | Freq: Once | INTRAMUSCULAR | Status: AC
  Administered 2018-08-26: 30 mg via INTRAMUSCULAR
  Filled 2018-08-26: qty 1

## 2018-08-26 MED ORDER — KETOROLAC TROMETHAMINE 10 MG PO TABS: 10.0000 mg | ORAL_TABLET | Freq: Four times a day (QID) | ORAL | 0 refills | Status: DC | PRN

## 2018-08-26 MED ORDER — LIDOCAINE VISCOUS HCL 2 % MT SOLN
15.0000 mL | Freq: Once | OROMUCOSAL | Status: AC
  Administered 2018-08-26: 15 mL via OROMUCOSAL
  Filled 2018-08-26: qty 15

## 2018-08-26 MED ORDER — LIDOCAINE VISCOUS HCL 2 % MT SOLN: 10.0000 mL | OROMUCOSAL | 0 refills | Status: DC | PRN

## 2018-08-26 MED ORDER — AMOXICILLIN 500 MG PO CAPS: 500.0000 mg | ORAL_CAPSULE | Freq: Three times a day (TID) | ORAL | 0 refills | Status: DC

## 2018-08-26 NOTE — Discharge Instructions (Addendum)
Please begin amoxicillin for dental infection.  You can use viscous lidocaine for pain.  You can also take Toradol for pain.  You can take Tylenol but do not take any ibuprofen.  OPTIONS FOR DENTAL FOLLOW UP CARE  [place] Department of Health and Human Services - Local Safety Net Dental Clinics [email or web address]   [facility] [phone])  [facility] Ma [phone])  [place] [phone])  [facility] [phone])  [facility] [phone]) This clinic caters to the indigent population and is on a lottery system. Location: [employer] [facility], [employer], [address], [place] Clinic Hours: Wednesdays from 6pm - 9pm, patients seen by a lottery system. For dates, call or go to [email or web address] Services: Cleanings, fillings and simple extractions. Payment Options: DENTAL WORK IS FREE OF CHARGE. Bring proof of income or support. Best way to get seen: Arrive at 5:15 pm - this is a lottery, NOT first come/first serve, so arriving earlier will not increase your chances of being seen.     [facility] [phone] Select option 1 for emergencies   Location: [facility], [place], [address] [place] Clinic Hours: No walk-ins accepted - call the day before to schedule an appointment. Check in times are 9:30 am and 1:30 pm. Services: Simple extractions, temporary fillings, pulpectomy/pulp debridement, uncomplicated abscess drainage. Payment Options: PAYMENT IS DUE AT THE TIME OF SERVICE.  Fee is usually $100-200, additional surgical procedures (e.g. abscess drainage) may be extra. Cash, checks, Visa/MasterCard accepted.  Can file Medicaid if patient is covered for dental - patient should call case worker to check. No discount for [facility] patients. Best way to get  seen: MUST call the day before and get onto the schedule. Can usually be seen the next 1-2 days. No walk-ins accepted.     [facility] [phone]   Location: [facility], [address] [place] Clinic Hours: M, W, Th, F 8am or 1:30pm, Tues 9a or 1:30 - first come/first served. Services: Simple extractions, temporary fillings, uncomplicated abscess drainage.  You do not need to be [facility]. Payment Options: PAYMENT IS DUE AT THE TIME OF SERVICE. Dental insurance, otherwise sliding scale - bring proof of income or support. Depending on income and treatment needed, cost is usually $50-200. Best way to get seen: Arrive early as it is first come/first served.     [facility] [phone]   Location: [address] Clinic Hours: Mon-Thu 8a-5p Services: Most basic dental services including extractions and fillings. Payment Options: PAYMENT IS DUE AT THE TIME OF SERVICE. Sliding scale, up to 50% off - bring proof if income or support. Medicaid with dental option accepted. Best way to get seen: Call to schedule an appointment, can usually be seen within 2 weeks OR they will try to see walk-ins - show up at 8a or 2p (you may have to wait).     [facility] [phone] [place] RESIDENTS ONLY   Location: [facility], [address], [place], [place] [address] Clinic Hours: By appointment only. Monday - Thursday 8am-5pm, Friday 8am-12pm Services: Cleanings, fillings, extractions. Payment Options: PAYMENT IS DUE AT THE TIME OF SERVICE. Cash, Visa or MasterCard. Sliding scale - $30 minimum per service. Best way to get seen: Come in to office, complete packet and make an appointment - need proof of income or support monies  for each household member and proof of [facility] residence. Usually takes about a month to get in.     [facility] [phone]   Location: [address] [place] Clinic Hours: Walk-in Urgent Care Dental Services are offered Monday-Friday mornings only. The numbers of emergencies accepted daily is limited to the number of providers available. Maximum 15 - Mondays, Wednesdays & Thursdays Maximum 10 - Tuesdays & Fridays Services: You do not need to be a [facility] resident to be seen for a dental emergency. Emergencies are defined as pain, swelling, abnormal bleeding, or dental trauma. Walkins will receive x-rays if needed. NOTE: Dental cleaning is not an emergency. Payment Options: PAYMENT IS DUE AT THE TIME OF SERVICE. Minimum co-pay is $40.00 for uninsured patients. Minimum co-pay is $3.00 for Medicaid with dental coverage. Dental Insurance is accepted and must be presented at time of visit. Medicare does not cover dental. Forms of payment: Cash, credit card, checks. Best way to get seen: If not previously registered with the clinic, walk-in dental registration begins at 7:15 am and is on a first come/first serve basis. If previously registered with the clinic, call to make an appointment.     [facility] [place] ONLY   Location: [address], [place], [place] Clinic Hours: Mon-Thu 10a-2p Services: Extractions only! Payment Options: FREE (donations accepted) - bring proof of income or support Best way to get seen: Call and schedule an appointment OR come at 8am on the 1st Monday of every month (except for holidays) when it is first come/first served.     [facility] [phone]   Location: [place], [place] Clinic Hours: Friday mornings Services, Payment Options, Best way to get seen: Call for info

## 2018-08-26 NOTE — ED Provider Notes (Signed)
[facility] Emergency Department Provider Note  ____________________________________________  Time seen: Approximately 3:27 PM  I have reviewed the triage vital signs and the nursing notes.   HISTORY  Chief Complaint Dental Pain    HPI Brandi Myers is a 33 y.o. female that presents to the emergency department for evaluation of right dental pain for 1 week.  Patient states the pain has been increasingly getting worse for the last week.  She has slept only a couple hours each night for the last 3 nights due to pain.  Pain radiates into her ear and into her jaw.  She states that she has several broken teeth on her right side.  She does not have a dentist because she lost her Medicaid.  She has been afraid to come to the emergency department for fear of COVID-19.  No fever. Her boyfriend brought her to the ER.   Past Medical History:  Diagnosis Date  . Diabetes mellitus without complication (HCC)   . Hypertension     Patient Active Problem List   Diagnosis Date Noted  . Cellulitis 02/26/2016    Past Surgical History:  Procedure Laterality Date  . CESAREAN SECTION    . CHOLECYSTECTOMY      Prior to Admission medications   Medication Sig Start Date End Date Taking? Authorizing Provider  amoxicillin (AMOXIL) 500 MG capsule Take 1 capsule (500 mg total) by mouth 3 (three) times daily. 08/26/18   Enid Derry, PA-C  cyclobenzaprine (FLEXERIL) 5 MG tablet Take 1 tablet (5 mg total) by mouth 3 (three) times daily as needed for muscle spasms. 12/15/16   Menshew, Charlesetta Ivory, PA-C  gabapentin (NEURONTIN) 300 MG capsule Take 600 mg by mouth 2 (two) times daily.     [provider]  ibuprofen (ADVIL,MOTRIN) 600 MG tablet Take 1 tablet (600 mg total) by mouth every 8 (eight) hours as needed. 09/09/16   Joni Reining, PA-C  insulin aspart (NOVOLOG) 100 UNIT/ML injection Inject 4 Units into the skin 3 (three) times daily with meals. 09/07/16   Phineas Semen, MD  insulin glargine (LANTUS) 100 UNIT/ML injection Inject 0.25 mLs (25 Units total) into the skin at bedtime. 09/07/16   Phineas Semen, MD  ketorolac (TORADOL) 10 MG tablet Take 1 tablet (10 mg total) by mouth every 6 (six) hours as needed. 08/26/18   Enid Derry, PA-C  Lancets (FREESTYLE) lancets Use as instructed 09/07/16   Phineas Semen, MD  lidocaine (XYLOCAINE) 2 % solution Use as directed 10 mLs in the mouth or throat as needed. 08/26/18   Enid Derry, PA-C  metFORMIN (GLUCOPHAGE) 1000 MG tablet Take 1 tablet (1,000 mg total) by mouth 2 (two) times daily with a meal. 01/04/18 01/04/19  Merrily Brittle, MD  naproxen (NAPROSYN) 500 MG tablet Take 1 tablet (500 mg total) by mouth 2 (two) times daily with a meal. 09/02/17   Triplett, Cari B, FNP  neomycin-polymyxin-pramoxine (NEOSPORIN PLUS) 1 % cream Apply topically 2 (two) times daily. 09/09/16   Joni Reining, PA-C  oxyCODONE-acetaminophen (PERCOCET/ROXICET) 5-325 MG tablet Take 1 tablet by mouth every 4 (four) hours as needed for severe pain. 01/04/18   Merrily Brittle, MD  traMADol (ULTRAM) 50 MG tablet Take 1 tablet (50 mg total) by mouth every 12 (twelve) hours as needed. 09/09/16   Joni Reining, PA-C    Allergies Norco [hydrocodone-acetaminophen]  Family History  Problem Relation Age of Onset  . Diabetes Maternal Grandmother     Social  History Social History   Tobacco Use  . Smoking status: Current Every Day Smoker    Packs/day: 0.10    Types: Cigarettes  . Smokeless tobacco: Never Used  Substance Use Topics  . Alcohol use: Yes    Comment: occ.  . Drug use: No     Review of Systems  Constitutional: No fever/chills Cardiovascular: No chest pain. Respiratory:  No SOB. Gastrointestinal: No abdominal pain.  No nausea, no vomiting.  Musculoskeletal: Negative for musculoskeletal pain. Skin: Negative for rash, abrasions, lacerations, ecchymosis. Neurological: Negative for  headaches   ____________________________________________   PHYSICAL EXAM:  VITAL SIGNS: ED Triage Vitals  Enc Vitals Group     BP 08/26/18 1512 (!) 137/104     Pulse Rate 08/26/18 1512 97     Resp 08/26/18 1512 18     Temp 08/26/18 1512 98.4 F (36.9 C)     Temp Source 08/26/18 1512 Oral     SpO2 08/26/18 1512 99 %     Weight 08/26/18 1505 181 lb (82.1 kg)     Height 08/26/18 1505 5\' 5"  (1.651 m)     Head Circumference --      Peak Flow --      Pain Score 08/26/18 1505 10     Pain Loc --      Pain Edu? --      Excl. in GC? --      Constitutional: Alert and oriented. Well appearing and in no acute distress. Eyes: Conjunctivae are normal. PERRL. EOMI. Head: Atraumatic. ENT:      Ears:      Nose: No congestion/rhinnorhea.      Mouth/Throat: Mucous membranes are moist.  Poor dentition.  Right molars have been pulled.  Large cavities with pain to tooth number 4 and 29.  No visible swelling.  Full range of motion of jaw. Neck: No stridor.  Cardiovascular: Normal rate, regular rhythm.  Good peripheral circulation. Respiratory: Normal respiratory effort without tachypnea or retractions. Lungs CTAB. Good air entry to the bases with no decreased or absent breath sounds. Musculoskeletal: Full range of motion to all extremities. No gross deformities appreciated. Neurologic:  Normal speech and language. No gross focal neurologic deficits are appreciated.  Skin:  Skin is warm, dry and intact. No rash noted. Psychiatric: Mood and affect are normal. Speech and behavior are normal. Patient exhibits appropriate insight and judgement.   ____________________________________________   LABS (all labs ordered are listed, but only abnormal results are displayed)  Labs Reviewed - No data to display ____________________________________________  EKG   ____________________________________________  RADIOLOGY  No results  found.  ____________________________________________    PROCEDURES  Procedure(s) performed:    Procedures    Medications  oxyCODONE-acetaminophen (PERCOCET/ROXICET) 5-325 MG per tablet 1 tablet (1 tablet Oral Given 08/26/18 1618)  ketorolac (TORADOL) 30 MG/ML injection 30 mg (30 mg Intramuscular Given 08/26/18 1618)  lidocaine (XYLOCAINE) 2 % viscous mouth solution 15 mL (15 mLs Mouth/Throat Given 08/26/18 1618)  amoxicillin (AMOXIL) capsule 500 mg (500 mg Oral Given 08/26/18 1618)     ____________________________________________   INITIAL IMPRESSION / ASSESSMENT AND PLAN / ED COURSE  Pertinent labs & imaging results that were available during my care of the patient were reviewed by me and considered in my medical decision making (see chart for details).  Review of the [place] CSRS was performed in accordance of the [facility] prior to dispensing any controlled drugs.     Patient's diagnosis is consistent with dental infection. Patient will be  discharged home with prescriptions for amoxicillin, viscous lidocaine, Toradol. Patient is to follow up with dentist as directed.  Dental resources were provided.  Patient is given ED precautions to return to the ED for any worsening or new symptoms.     ____________________________________________  FINAL CLINICAL IMPRESSION(S) / ED DIAGNOSES  Final diagnoses:  Dental infection      NEW MEDICATIONS STARTED DURING THIS VISIT:  ED Discharge Orders         Ordered    lidocaine (XYLOCAINE) 2 % solution  As needed     08/26/18 1622    amoxicillin (AMOXIL) 500 MG capsule  3 times daily     08/26/18 1622    ketorolac (TORADOL) 10 MG tablet  Every 6 hours PRN     08/26/18 1622              This chart was dictated using voice recognition software/Dragon. Despite best efforts to proofread, errors can occur which can change the meaning. Any change was purely unintentional.    Enid DerryWagner, Thanh Mottern, PA-C 08/26/18 2225    Emily FilbertWilliams,  Jonathan E, MD 08/26/18 [phone]

## 2018-08-26 NOTE — ED Triage Notes (Signed)
Dental pain for a week now, tearful in triage.

## 2019-11-02 ENCOUNTER — Encounter: Payer: Self-pay | Admitting: Physician Assistant

## 2019-11-02 ENCOUNTER — Emergency Department
Admission: EM | Admit: 2019-11-02 | Discharge: 2019-11-02 | Disposition: A | Discharge status: Home / Self Care | Payer: Medicaid Other | Attending: Emergency Medicine | Admitting: Emergency Medicine

## 2019-11-02 ENCOUNTER — Other Ambulatory Visit: Payer: Self-pay

## 2019-11-02 DIAGNOSIS — E119 Type 2 diabetes mellitus without complications: Secondary | ICD-10-CM | POA: Diagnosis not present

## 2019-11-02 DIAGNOSIS — Z79899 Other long term (current) drug therapy: Secondary | ICD-10-CM | POA: Diagnosis not present

## 2019-11-02 DIAGNOSIS — F17210 Nicotine dependence, cigarettes, uncomplicated: Secondary | ICD-10-CM | POA: Insufficient documentation

## 2019-11-02 DIAGNOSIS — I10 Essential (primary) hypertension: Secondary | ICD-10-CM | POA: Insufficient documentation

## 2019-11-02 DIAGNOSIS — J020 Streptococcal pharyngitis: Secondary | ICD-10-CM | POA: Diagnosis not present

## 2019-11-02 DIAGNOSIS — Z7984 Long term (current) use of oral hypoglycemic drugs: Secondary | ICD-10-CM | POA: Insufficient documentation

## 2019-11-02 DIAGNOSIS — J029 Acute pharyngitis, unspecified: Secondary | ICD-10-CM | POA: Diagnosis present

## 2019-11-02 LAB — GROUP A STREP BY PCR: Group A Strep by PCR: DETECTED — AB

## 2019-11-02 LAB — GLUCOSE, CAPILLARY: Glucose-Capillary: 335 mg/dL — ABNORMAL HIGH (ref 70–99)

## 2019-11-02 MED ORDER — METFORMIN HCL 1000 MG PO TABS: 1000.0000 mg | ORAL_TABLET | Freq: Two times a day (BID) | ORAL | 0 refills | Status: DC

## 2019-11-02 MED ORDER — PENICILLIN G BENZATHINE 1200000 UNIT/2ML IM SUSP
1.2000 10*6.[IU] | Freq: Once | INTRAMUSCULAR | Status: AC
  Administered 2019-11-02: 1.2 10*6.[IU] via INTRAMUSCULAR
  Filled 2019-11-02: qty 2

## 2019-11-02 MED ORDER — METFORMIN HCL 500 MG PO TABS
500.0000 mg | ORAL_TABLET | Freq: Once | ORAL | Status: AC
  Administered 2019-11-02: 500 mg via ORAL
  Filled 2019-11-02: qty 1

## 2019-11-02 MED ORDER — LIDOCAINE VISCOUS HCL 2 % MT SOLN
15.0000 mL | Freq: Once | OROMUCOSAL | Status: AC
  Administered 2019-11-02: 15 mL via OROMUCOSAL
  Filled 2019-11-02: qty 15

## 2019-11-02 MED ORDER — DEXAMETHASONE SODIUM PHOSPHATE 10 MG/ML IJ SOLN
10.0000 mg | Freq: Once | INTRAMUSCULAR | Status: AC
  Administered 2019-11-02: 10 mg via INTRAMUSCULAR
  Filled 2019-11-02: qty 1

## 2019-11-02 NOTE — ED Provider Notes (Signed)
[facility] Emergency Department Provider Note ____________________________________________  Time seen: 2155  I have reviewed the triage vital signs and the nursing notes.  HISTORY  Chief Complaint  Sore Throat  HPI Brandi Myers is a 34 y.o. female presents her self to the ED for evaluation of sore throat pain with pain on the left slightly rater than right.  Patient reports symptoms for the last week.  She denies any frank fevers, nausea, vomiting, or dizziness patient also denies any history of recurrent tonsillitis.  She notes hoarse voice, and pain with swallowing liquids.   She is been able to control her secretions, and denies any difficulty breathing.  Past Medical History:  Diagnosis Date  . Diabetes mellitus without complication (HCC)   . Hypertension     Patient Active Problem List   Diagnosis Date Noted  . Cellulitis 02/26/2016    Past Surgical History:  Procedure Laterality Date  . CESAREAN SECTION    . CHOLECYSTECTOMY      Prior to Admission medications   Medication Sig Start Date End Date Taking? Authorizing Provider  amoxicillin (AMOXIL) 500 MG capsule Take 1 capsule (500 mg total) by mouth 3 (three) times daily. 08/26/18   Enid Derry, PA-C  cyclobenzaprine (FLEXERIL) 5 MG tablet Take 1 tablet (5 mg total) by mouth 3 (three) times daily as needed for muscle spasms. 12/15/16   Eytan Carrigan, Charlesetta Ivory, PA-C  gabapentin (NEURONTIN) 300 MG capsule Take 600 mg by mouth 2 (two) times daily.     [provider]  ibuprofen (ADVIL,MOTRIN) 600 MG tablet Take 1 tablet (600 mg total) by mouth every 8 (eight) hours as needed. 09/09/16   Joni Reining, PA-C  insulin aspart (NOVOLOG) 100 UNIT/ML injection Inject 4 Units into the skin 3 (three) times daily with meals. 09/07/16   Phineas Semen, MD  insulin glargine (LANTUS) 100 UNIT/ML injection Inject 0.25 mLs (25 Units total) into the skin at bedtime. 09/07/16   Phineas Semen, MD   ketorolac (TORADOL) 10 MG tablet Take 1 tablet (10 mg total) by mouth every 6 (six) hours as needed. 08/26/18   Enid Derry, PA-C  Lancets (FREESTYLE) lancets Use as instructed 09/07/16   Phineas Semen, MD  lidocaine (XYLOCAINE) 2 % solution Use as directed 10 mLs in the mouth or throat as needed. 08/26/18   Enid Derry, PA-C  metFORMIN (GLUCOPHAGE) 1000 MG tablet Take 1 tablet (1,000 mg total) by mouth 2 (two) times daily with a meal. 01/04/18 01/04/19  Merrily Brittle, MD  naproxen (NAPROSYN) 500 MG tablet Take 1 tablet (500 mg total) by mouth 2 (two) times daily with a meal. 09/02/17   Triplett, Cari B, FNP  neomycin-polymyxin-pramoxine (NEOSPORIN PLUS) 1 % cream Apply topically 2 (two) times daily. 09/09/16   Joni Reining, PA-C  oxyCODONE-acetaminophen (PERCOCET/ROXICET) 5-325 MG tablet Take 1 tablet by mouth every 4 (four) hours as needed for severe pain. 01/04/18   Merrily Brittle, MD  traMADol (ULTRAM) 50 MG tablet Take 1 tablet (50 mg total) by mouth every 12 (twelve) hours as needed. 09/09/16   Joni Reining, PA-C    Allergies Norco [hydrocodone-acetaminophen]  Family History  Problem Relation Age of Onset  . Diabetes Maternal Grandmother     Social History Social History   Tobacco Use  . Smoking status: Current Every Day Smoker    Packs/day: 0.10    Types: Cigarettes  . Smokeless tobacco: Never Used  Substance Use Topics  . Alcohol use: Yes  Comment: occ.  . Drug use: No    Review of Systems  Constitutional: Negative for fever. Eyes: Negative for visual changes. ENT: Positive for sore throat. Cardiovascular: Negative for chest pain. Respiratory: Negative for shortness of breath. Gastrointestinal: Negative for abdominal pain, vomiting and diarrhea. Musculoskeletal: Negative for back pain. Skin: Negative for rash. Neurological: Negative for headaches, focal weakness or numbness. ____________________________________________  PHYSICAL EXAM:  VITAL  SIGNS: ED Triage Vitals  Enc Vitals Group     BP 11/02/19 1957 (!) 162/97     Pulse Rate 11/02/19 1957 (!) 111     Resp 11/02/19 1957 18     Temp 11/02/19 1957 98 F (36.7 C)     Temp Source 11/02/19 1957 Oral     SpO2 11/02/19 1957 99 %     Weight 11/02/19 2000 175 lb (79.4 kg)     Height 11/02/19 2000 5\' 5"  (1.651 m)     Head Circumference --      Peak Flow --      Pain Score --      Pain Loc --      Pain Edu? --      Excl. in GC? --     Constitutional: Alert and oriented. Well appearing and in no distress. Head: Normocephalic and atraumatic. Eyes: Conjunctivae are normal. PERRL. Normal extraocular movements Mouth/Throat: Mucous membranes are moist.  Tonsils are gray but no frank tonsillar exudates are appreciated.  Uvula is midline and tonsils are slightly enlarged and erythematous.  Tonsillar pillars are also erythematous.  No oral lesions are appreciated.  No brawny sublingual erythema noted. Neck: Supple. No thyromegaly. Hematological/Lymphatic/Immunological: Mildly tender anterior cervical lymphadenopathy. Cardiovascular: Normal rate, regular rhythm. Normal distal pulses. Respiratory: Normal respiratory effort. No wheezes/rales/rhonchi. Gastrointestinal: Soft and nontender. No distention. Musculoskeletal: Nontender with normal range of motion in all extremities.  Neurologic:  Normal gait without ataxia. Normal speech and language. No gross focal neurologic deficits are appreciated. Skin:  Skin is warm, dry and intact. No rash noted. Psychiatric: Mood and affect are normal. Patient exhibits appropriate insight and judgment. ____________________________________________   LABS (pertinent positives/negatives) Labs Reviewed  GROUP A STREP BY PCR - Abnormal; Notable for the following components:      Result Value   Group A Strep by PCR DETECTED (*)    All other components within normal limits  GLUCOSE, CAPILLARY - Abnormal; Notable for the following components:    Glucose-Capillary 335 (*)    All other components within normal limits  ____________________________________________  PROCEDURES  Decadron 10 mg IM Metformin 500 mg p.o. Lidocaine 2% viscous p.o. Penicillin G benzathine 1,200,000 units IM  Procedures ____________________________________________  INITIAL IMPRESSION / ASSESSMENT AND PLAN / ED COURSE  DDX: strep tonsillitis, PTA, dental abscess, viral pharyngitis  Patient with ED evaluation of a 1 week complaint of worsening sore throat pain, left greater than right.  Patient presents with concerns for possible pharyngitis/tonsillitis.  Strep PCR was positive on exam.  Patient was treated with a single dose of pen G given in the ED.  She will continue to monitor and treat any fevers and ongoing symptoms.  Return precautions have been reviewed, including continued unilateral swelling, difficulty controlling oral secretions, difficulty breathing.  Brandi Myers was evaluated in Emergency Department on 11/02/2019 for the symptoms described in the history of present illness. She was evaluated in the context of the global COVID-19 pandemic, which necessitated consideration that the patient might be at risk for infection with the SARS-CoV-2 virus that  causes COVID-19. Institutional protocols and algorithms that pertain to the evaluation of patients at risk for COVID-19 are in a state of rapid change based on information released by regulatory bodies including the CDC and federal and state organizations. These policies and algorithms were followed during the patient's care in the ED. ____________________________________________  FINAL CLINICAL IMPRESSION(S) / ED DIAGNOSES  Final diagnoses:  Strep throat      Ilea Hilton, Charlesetta Ivory, PA-C 11/02/19 2328    Phineas Semen, MD 11/02/19 2333

## 2019-11-02 NOTE — ED Triage Notes (Addendum)
Pt in with co sore throat since Sunday. States since then has had increased swelling to left neck, fells pressure to left ear, and sinus congestion. Pt denies any fever, pt does have a raspy voice. Pt does have swelling and redness to eft tonsil, airway intact.

## 2019-11-02 NOTE — ED Notes (Signed)
Patient discharged to home per MD order. Patient in stable condition, and deemed medically cleared by ED provider for discharge. Discharge instructions reviewed with patient/family using "Teach Back"; verbalized understanding of medication education and administration, and information about follow-up care. Denies further concerns. ° °

## 2019-11-02 NOTE — Discharge Instructions (Addendum)
You have been tested and found to be positive for strep throat. You should continue monitoring symptoms and take Tylenol Motrin for any fevers. Return to the ED immediately for swelling to one side of the throat, difficulty swallowing, or difficulty breathing. Change your toothbrush in 24 hours.

## 2020-07-04 ENCOUNTER — Encounter (HOSPITAL_COMMUNITY)
Admission: EM | Disposition: A | Discharge status: Skilled Nursing Facility | Payer: Self-pay | Source: Other Acute Inpatient Hospital | Attending: Neurology

## 2020-07-04 ENCOUNTER — Emergency Department (HOSPITAL_COMMUNITY): Payer: Medicaid Other

## 2020-07-04 ENCOUNTER — Emergency Department: Payer: Medicaid Other

## 2020-07-04 ENCOUNTER — Emergency Department
Admission: EM | Admit: 2020-07-04 | Discharge: 2020-07-04 | Disposition: A | Discharge status: Other Acute Inpatient Hospital | Payer: Medicaid Other | Attending: Emergency Medicine | Admitting: Emergency Medicine

## 2020-07-04 ENCOUNTER — Inpatient Hospital Stay: Admit: 2020-07-04 | Payer: Medicaid Other

## 2020-07-04 ENCOUNTER — Inpatient Hospital Stay (HOSPITAL_COMMUNITY)
Admission: EM | Admit: 2020-07-04 | Discharge: 2020-08-03 | DRG: 023 | Disposition: A | Discharge status: Skilled Nursing Facility | Payer: Medicaid Other | Source: Other Acute Inpatient Hospital | Attending: Internal Medicine | Admitting: Internal Medicine

## 2020-07-04 ENCOUNTER — Emergency Department (HOSPITAL_COMMUNITY): Payer: Medicaid Other | Admitting: Anesthesiology

## 2020-07-04 DIAGNOSIS — I6521 Occlusion and stenosis of right carotid artery: Secondary | ICD-10-CM | POA: Insufficient documentation

## 2020-07-04 DIAGNOSIS — R000 Tachycardia, unspecified: Secondary | ICD-10-CM | POA: Diagnosis not present

## 2020-07-04 DIAGNOSIS — J189 Pneumonia, unspecified organism: Secondary | ICD-10-CM

## 2020-07-04 DIAGNOSIS — D72829 Elevated white blood cell count, unspecified: Secondary | ICD-10-CM | POA: Diagnosis not present

## 2020-07-04 DIAGNOSIS — E119 Type 2 diabetes mellitus without complications: Secondary | ICD-10-CM | POA: Diagnosis not present

## 2020-07-04 DIAGNOSIS — E1165 Type 2 diabetes mellitus with hyperglycemia: Secondary | ICD-10-CM | POA: Diagnosis present

## 2020-07-04 DIAGNOSIS — J9601 Acute respiratory failure with hypoxia: Secondary | ICD-10-CM | POA: Diagnosis not present

## 2020-07-04 DIAGNOSIS — F17210 Nicotine dependence, cigarettes, uncomplicated: Secondary | ICD-10-CM | POA: Diagnosis present

## 2020-07-04 DIAGNOSIS — F17200 Nicotine dependence, unspecified, uncomplicated: Secondary | ICD-10-CM | POA: Diagnosis not present

## 2020-07-04 DIAGNOSIS — E1065 Type 1 diabetes mellitus with hyperglycemia: Secondary | ICD-10-CM | POA: Diagnosis not present

## 2020-07-04 DIAGNOSIS — F05 Delirium due to known physiological condition: Secondary | ICD-10-CM | POA: Diagnosis not present

## 2020-07-04 DIAGNOSIS — R1319 Other dysphagia: Secondary | ICD-10-CM | POA: Diagnosis not present

## 2020-07-04 DIAGNOSIS — R4182 Altered mental status, unspecified: Secondary | ICD-10-CM | POA: Diagnosis present

## 2020-07-04 DIAGNOSIS — J95851 Ventilator associated pneumonia: Secondary | ICD-10-CM | POA: Diagnosis not present

## 2020-07-04 DIAGNOSIS — Z794 Long term (current) use of insulin: Secondary | ICD-10-CM | POA: Insufficient documentation

## 2020-07-04 DIAGNOSIS — I63231 Cerebral infarction due to unspecified occlusion or stenosis of right carotid arteries: Secondary | ICD-10-CM | POA: Diagnosis not present

## 2020-07-04 DIAGNOSIS — Z9282 Status post administration of tPA (rtPA) in a different facility within the last 24 hours prior to admission to current facility: Secondary | ICD-10-CM

## 2020-07-04 DIAGNOSIS — I639 Cerebral infarction, unspecified: Secondary | ICD-10-CM | POA: Diagnosis not present

## 2020-07-04 DIAGNOSIS — I6601 Occlusion and stenosis of right middle cerebral artery: Secondary | ICD-10-CM | POA: Diagnosis present

## 2020-07-04 DIAGNOSIS — Z823 Family history of stroke: Secondary | ICD-10-CM

## 2020-07-04 DIAGNOSIS — I742 Embolism and thrombosis of arteries of the upper extremities: Secondary | ICD-10-CM

## 2020-07-04 DIAGNOSIS — Z9289 Personal history of other medical treatment: Secondary | ICD-10-CM

## 2020-07-04 DIAGNOSIS — G911 Obstructive hydrocephalus: Secondary | ICD-10-CM | POA: Diagnosis not present

## 2020-07-04 DIAGNOSIS — Z597 Insufficient social insurance and welfare support: Secondary | ICD-10-CM

## 2020-07-04 DIAGNOSIS — R4701 Aphasia: Secondary | ICD-10-CM | POA: Diagnosis present

## 2020-07-04 DIAGNOSIS — R509 Fever, unspecified: Secondary | ICD-10-CM

## 2020-07-04 DIAGNOSIS — G9341 Metabolic encephalopathy: Secondary | ICD-10-CM | POA: Diagnosis present

## 2020-07-04 DIAGNOSIS — R32 Unspecified urinary incontinence: Secondary | ICD-10-CM | POA: Diagnosis present

## 2020-07-04 DIAGNOSIS — G936 Cerebral edema: Secondary | ICD-10-CM | POA: Diagnosis present

## 2020-07-04 DIAGNOSIS — E11311 Type 2 diabetes mellitus with unspecified diabetic retinopathy with macular edema: Secondary | ICD-10-CM | POA: Diagnosis present

## 2020-07-04 DIAGNOSIS — G935 Compression of brain: Secondary | ICD-10-CM | POA: Diagnosis present

## 2020-07-04 DIAGNOSIS — T8131XA Disruption of external operation (surgical) wound, not elsewhere classified, initial encounter: Secondary | ICD-10-CM | POA: Diagnosis not present

## 2020-07-04 DIAGNOSIS — M62221 Nontraumatic ischemic infarction of muscle, right upper arm: Secondary | ICD-10-CM | POA: Diagnosis not present

## 2020-07-04 DIAGNOSIS — Z7984 Long term (current) use of oral hypoglycemic drugs: Secondary | ICD-10-CM | POA: Insufficient documentation

## 2020-07-04 DIAGNOSIS — R0682 Tachypnea, not elsewhere classified: Secondary | ICD-10-CM | POA: Diagnosis not present

## 2020-07-04 DIAGNOSIS — K319 Disease of stomach and duodenum, unspecified: Secondary | ICD-10-CM | POA: Diagnosis present

## 2020-07-04 DIAGNOSIS — IMO0002 Reserved for concepts with insufficient information to code with codable children: Secondary | ICD-10-CM

## 2020-07-04 DIAGNOSIS — D6489 Other specified anemias: Secondary | ICD-10-CM | POA: Diagnosis present

## 2020-07-04 DIAGNOSIS — I749 Embolism and thrombosis of unspecified artery: Secondary | ICD-10-CM | POA: Diagnosis not present

## 2020-07-04 DIAGNOSIS — N390 Urinary tract infection, site not specified: Secondary | ICD-10-CM | POA: Diagnosis not present

## 2020-07-04 DIAGNOSIS — I63031 Cerebral infarction due to thrombosis of right carotid artery: Secondary | ICD-10-CM | POA: Diagnosis present

## 2020-07-04 DIAGNOSIS — I63511 Cerebral infarction due to unspecified occlusion or stenosis of right middle cerebral artery: Secondary | ICD-10-CM | POA: Diagnosis not present

## 2020-07-04 DIAGNOSIS — Z833 Family history of diabetes mellitus: Secondary | ICD-10-CM

## 2020-07-04 DIAGNOSIS — I998 Other disorder of circulatory system: Secondary | ICD-10-CM | POA: Diagnosis not present

## 2020-07-04 DIAGNOSIS — E441 Mild protein-calorie malnutrition: Secondary | ICD-10-CM | POA: Diagnosis present

## 2020-07-04 DIAGNOSIS — E785 Hyperlipidemia, unspecified: Secondary | ICD-10-CM | POA: Diagnosis present

## 2020-07-04 DIAGNOSIS — F1410 Cocaine abuse, uncomplicated: Secondary | ICD-10-CM | POA: Diagnosis present

## 2020-07-04 DIAGNOSIS — Z20822 Contact with and (suspected) exposure to covid-19: Secondary | ICD-10-CM | POA: Insufficient documentation

## 2020-07-04 DIAGNOSIS — E1151 Type 2 diabetes mellitus with diabetic peripheral angiopathy without gangrene: Secondary | ICD-10-CM | POA: Diagnosis present

## 2020-07-04 DIAGNOSIS — T85598A Other mechanical complication of other gastrointestinal prosthetic devices, implants and grafts, initial encounter: Secondary | ICD-10-CM

## 2020-07-04 DIAGNOSIS — D75838 Other thrombocytosis: Secondary | ICD-10-CM | POA: Diagnosis not present

## 2020-07-04 DIAGNOSIS — Z781 Physical restraint status: Secondary | ICD-10-CM

## 2020-07-04 DIAGNOSIS — I10 Essential (primary) hypertension: Secondary | ICD-10-CM

## 2020-07-04 DIAGNOSIS — Y848 Other medical procedures as the cause of abnormal reaction of the patient, or of later complication, without mention of misadventure at the time of the procedure: Secondary | ICD-10-CM | POA: Diagnosis not present

## 2020-07-04 DIAGNOSIS — G8100 Flaccid hemiplegia affecting unspecified side: Secondary | ICD-10-CM

## 2020-07-04 DIAGNOSIS — J13 Pneumonia due to Streptococcus pneumoniae: Secondary | ICD-10-CM | POA: Diagnosis not present

## 2020-07-04 DIAGNOSIS — R414 Neurologic neglect syndrome: Secondary | ICD-10-CM | POA: Diagnosis present

## 2020-07-04 DIAGNOSIS — I70208 Unspecified atherosclerosis of native arteries of extremities, other extremity: Secondary | ICD-10-CM | POA: Diagnosis not present

## 2020-07-04 DIAGNOSIS — D75839 Thrombocytosis, unspecified: Secondary | ICD-10-CM

## 2020-07-04 DIAGNOSIS — E1140 Type 2 diabetes mellitus with diabetic neuropathy, unspecified: Secondary | ICD-10-CM | POA: Diagnosis present

## 2020-07-04 DIAGNOSIS — E876 Hypokalemia: Secondary | ICD-10-CM | POA: Diagnosis present

## 2020-07-04 DIAGNOSIS — G8194 Hemiplegia, unspecified affecting left nondominant side: Secondary | ICD-10-CM | POA: Diagnosis present

## 2020-07-04 DIAGNOSIS — I6389 Other cerebral infarction: Secondary | ICD-10-CM | POA: Diagnosis not present

## 2020-07-04 DIAGNOSIS — I70221 Atherosclerosis of native arteries of extremities with rest pain, right leg: Secondary | ICD-10-CM | POA: Diagnosis present

## 2020-07-04 DIAGNOSIS — J15211 Pneumonia due to Methicillin susceptible Staphylococcus aureus: Secondary | ICD-10-CM | POA: Diagnosis not present

## 2020-07-04 DIAGNOSIS — R4781 Slurred speech: Secondary | ICD-10-CM | POA: Diagnosis present

## 2020-07-04 DIAGNOSIS — E870 Hyperosmolality and hypernatremia: Secondary | ICD-10-CM | POA: Diagnosis not present

## 2020-07-04 DIAGNOSIS — J9602 Acute respiratory failure with hypercapnia: Secondary | ICD-10-CM

## 2020-07-04 DIAGNOSIS — R29810 Facial weakness: Secondary | ICD-10-CM | POA: Diagnosis present

## 2020-07-04 DIAGNOSIS — R1314 Dysphagia, pharyngoesophageal phase: Secondary | ICD-10-CM

## 2020-07-04 DIAGNOSIS — J9600 Acute respiratory failure, unspecified whether with hypoxia or hypercapnia: Secondary | ICD-10-CM | POA: Diagnosis not present

## 2020-07-04 DIAGNOSIS — I63131 Cerebral infarction due to embolism of right carotid artery: Secondary | ICD-10-CM | POA: Diagnosis not present

## 2020-07-04 DIAGNOSIS — I6781 Acute cerebrovascular insufficiency: Secondary | ICD-10-CM | POA: Insufficient documentation

## 2020-07-04 DIAGNOSIS — Z609 Problem related to social environment, unspecified: Secondary | ICD-10-CM | POA: Diagnosis present

## 2020-07-04 DIAGNOSIS — E039 Hypothyroidism, unspecified: Secondary | ICD-10-CM | POA: Diagnosis present

## 2020-07-04 DIAGNOSIS — E0590 Thyrotoxicosis, unspecified without thyrotoxic crisis or storm: Secondary | ICD-10-CM | POA: Diagnosis present

## 2020-07-04 DIAGNOSIS — I63411 Cerebral infarction due to embolism of right middle cerebral artery: Secondary | ICD-10-CM | POA: Diagnosis not present

## 2020-07-04 DIAGNOSIS — E11649 Type 2 diabetes mellitus with hypoglycemia without coma: Secondary | ICD-10-CM | POA: Diagnosis not present

## 2020-07-04 DIAGNOSIS — D509 Iron deficiency anemia, unspecified: Secondary | ICD-10-CM | POA: Diagnosis present

## 2020-07-04 DIAGNOSIS — K5900 Constipation, unspecified: Secondary | ICD-10-CM | POA: Diagnosis not present

## 2020-07-04 DIAGNOSIS — G8104 Flaccid hemiplegia affecting left nondominant side: Secondary | ICD-10-CM | POA: Insufficient documentation

## 2020-07-04 DIAGNOSIS — E871 Hypo-osmolality and hyponatremia: Secondary | ICD-10-CM | POA: Diagnosis not present

## 2020-07-04 DIAGNOSIS — Z6825 Body mass index (BMI) 25.0-25.9, adult: Secondary | ICD-10-CM

## 2020-07-04 DIAGNOSIS — Z9889 Other specified postprocedural states: Secondary | ICD-10-CM | POA: Diagnosis not present

## 2020-07-04 DIAGNOSIS — Z79899 Other long term (current) drug therapy: Secondary | ICD-10-CM

## 2020-07-04 DIAGNOSIS — I608 Other nontraumatic subarachnoid hemorrhage: Secondary | ICD-10-CM | POA: Diagnosis present

## 2020-07-04 DIAGNOSIS — T17908A Unspecified foreign body in respiratory tract, part unspecified causing other injury, initial encounter: Secondary | ICD-10-CM

## 2020-07-04 DIAGNOSIS — F1210 Cannabis abuse, uncomplicated: Secondary | ICD-10-CM | POA: Diagnosis present

## 2020-07-04 DIAGNOSIS — R5081 Fever presenting with conditions classified elsewhere: Secondary | ICD-10-CM | POA: Diagnosis not present

## 2020-07-04 DIAGNOSIS — J15212 Pneumonia due to Methicillin resistant Staphylococcus aureus: Secondary | ICD-10-CM | POA: Diagnosis not present

## 2020-07-04 HISTORY — PX: IR PERCUTANEOUS ART THROMBECTOMY/INFUSION INTRACRANIAL INC DIAG ANGIO: IMG6087

## 2020-07-04 HISTORY — PX: IR CT HEAD LTD: IMG2386

## 2020-07-04 HISTORY — PX: IR ANGIO VERTEBRAL SEL VERTEBRAL UNI R MOD SED: IMG5368

## 2020-07-04 HISTORY — PX: RADIOLOGY WITH ANESTHESIA: SHX6223

## 2020-07-04 HISTORY — PX: IR ANGIO INTRA EXTRACRAN SEL COM CAROTID INNOMINATE UNI L MOD SED: IMG5358

## 2020-07-04 LAB — CBC
HCT: 35 % — ABNORMAL LOW (ref 36.0–46.0)
Hemoglobin: 9.5 g/dL — ABNORMAL LOW (ref 12.0–15.0)
MCH: 18.2 pg — ABNORMAL LOW (ref 26.0–34.0)
MCHC: 27.1 g/dL — ABNORMAL LOW (ref 30.0–36.0)
MCV: 67.2 fL — ABNORMAL LOW (ref 80.0–100.0)
Platelets: 235 10*3/uL (ref 150–400)
RBC: 5.21 MIL/uL — ABNORMAL HIGH (ref 3.87–5.11)
RDW: 21.7 % — ABNORMAL HIGH (ref 11.5–15.5)
WBC: 17.8 10*3/uL — ABNORMAL HIGH (ref 4.0–10.5)
nRBC: 0 % (ref 0.0–0.2)

## 2020-07-04 LAB — DIFFERENTIAL
Abs Immature Granulocytes: 0.14 10*3/uL — ABNORMAL HIGH (ref 0.00–0.07)
Basophils Absolute: 0.1 10*3/uL (ref 0.0–0.1)
Basophils Relative: 0 %
Eosinophils Absolute: 0.3 10*3/uL (ref 0.0–0.5)
Eosinophils Relative: 2 %
Immature Granulocytes: 1 %
Lymphocytes Relative: 18 %
Lymphs Abs: 3.2 10*3/uL (ref 0.7–4.0)
Monocytes Absolute: 1.4 10*3/uL — ABNORMAL HIGH (ref 0.1–1.0)
Monocytes Relative: 8 %
Neutro Abs: 12.7 10*3/uL — ABNORMAL HIGH (ref 1.7–7.7)
Neutrophils Relative %: 71 %

## 2020-07-04 LAB — GLUCOSE, CAPILLARY
Glucose-Capillary: 124 mg/dL — ABNORMAL HIGH (ref 70–99)
Glucose-Capillary: 158 mg/dL — ABNORMAL HIGH (ref 70–99)
Glucose-Capillary: 348 mg/dL — ABNORMAL HIGH (ref 70–99)

## 2020-07-04 LAB — COMPREHENSIVE METABOLIC PANEL
ALT: 14 U/L (ref 0–44)
AST: 23 U/L (ref 15–41)
Albumin: 3.3 g/dL — ABNORMAL LOW (ref 3.5–5.0)
Alkaline Phosphatase: 84 U/L (ref 38–126)
Anion gap: 13 (ref 5–15)
BUN: <5 mg/dL — ABNORMAL LOW (ref 6–20)
CO2: 21 mmol/L — ABNORMAL LOW (ref 22–32)
Calcium: 8.3 mg/dL — ABNORMAL LOW (ref 8.9–10.3)
Chloride: 99 mmol/L (ref 98–111)
Creatinine, Ser: 0.59 mg/dL (ref 0.44–1.00)
GFR, Estimated: >60 mL/min (ref >60)
Glucose, Bld: 448 mg/dL — ABNORMAL HIGH (ref 70–99)
Potassium: 2.9 mmol/L — ABNORMAL LOW (ref 3.5–5.1)
Sodium: 133 mmol/L — ABNORMAL LOW (ref 135–145)
Total Bilirubin: 1 mg/dL (ref 0.3–1.2)
Total Protein: 6.6 g/dL (ref 6.5–8.1)

## 2020-07-04 LAB — POCT I-STAT 7, (LYTES, BLD GAS, ICA,H+H)
Acid-Base Excess: 1 mmol/L (ref 0.0–2.0)
Bicarbonate: 25.3 mmol/L (ref 20.0–28.0)
Calcium, Ion: 1.12 mmol/L — ABNORMAL LOW (ref 1.15–1.40)
HCT: 29 % — ABNORMAL LOW (ref 36.0–46.0)
Hemoglobin: 9.9 g/dL — ABNORMAL LOW (ref 12.0–15.0)
O2 Saturation: 99 %
Patient temperature: 98.6
Potassium: 3.1 mmol/L — ABNORMAL LOW (ref 3.5–5.1)
Sodium: 140 mmol/L (ref 135–145)
TCO2: 27 mmol/L (ref 22–32)
pCO2 arterial: 40.1 mmHg (ref 32.0–48.0)
pH, Arterial: 7.409 (ref 7.350–7.450)
pO2, Arterial: 155 mmHg — ABNORMAL HIGH (ref 83.0–108.0)

## 2020-07-04 LAB — ETHANOL: Alcohol, Ethyl (B): <10 mg/dL (ref <10)

## 2020-07-04 LAB — RESP PANEL BY RT-PCR (FLU A&B, COVID) ARPGX2
Influenza A by PCR: NEGATIVE
Influenza B by PCR: NEGATIVE
SARS Coronavirus 2 by RT PCR: NEGATIVE

## 2020-07-04 LAB — HCG, QUANTITATIVE, PREGNANCY: hCG, Beta Chain, Quant, S: <1 m[IU]/mL (ref <5)

## 2020-07-04 LAB — BLOOD GAS, VENOUS
Acid-base deficit: 0.9 mmol/L (ref 0.0–2.0)
Bicarbonate: 23.5 mmol/L (ref 20.0–28.0)
O2 Saturation: 99.1 %
Patient temperature: 37
pCO2, Ven: 37 mmHg — ABNORMAL LOW (ref 44.0–60.0)
pH, Ven: 7.41 (ref 7.250–7.430)
pO2, Ven: 138 mmHg — ABNORMAL HIGH (ref 32.0–45.0)

## 2020-07-04 LAB — CBG MONITORING, ED
Glucose-Capillary: 412 mg/dL — ABNORMAL HIGH (ref 70–99)
Glucose-Capillary: 316 mg/dL — ABNORMAL HIGH (ref 70–99)

## 2020-07-04 LAB — APTT: aPTT: 24 seconds (ref 24–36)

## 2020-07-04 LAB — HEMOGLOBIN A1C
Hgb A1c MFr Bld: 10.6 % — ABNORMAL HIGH (ref 4.8–5.6)
Mean Plasma Glucose: 257.52 mg/dL

## 2020-07-04 LAB — PROTIME-INR
INR: 1.2 (ref 0.8–1.2)
Prothrombin Time: 14.3 seconds (ref 11.4–15.2)

## 2020-07-04 LAB — HIV ANTIBODY (ROUTINE TESTING W REFLEX): HIV Screen 4th Generation wRfx: NONREACTIVE

## 2020-07-04 SURGERY — IR WITH ANESTHESIA
Anesthesia: General

## 2020-07-04 MED ORDER — FENTANYL 2500MCG IN NS 250ML (10MCG/ML) PREMIX INFUSION
0.0000 ug/h | INTRAVENOUS | Status: DC
  Administered 2020-07-04: 100 ug/h via INTRAVENOUS
  Administered 2020-07-05: 80 ug/h via INTRAVENOUS
  Administered 2020-07-06: 25 ug/h via INTRAVENOUS
  Administered 2020-07-07 – 2020-07-08 (×2): 150 ug/h via INTRAVENOUS
  Administered 2020-07-08 – 2020-07-09 (×3): 200 ug/h via INTRAVENOUS
  Administered 2020-07-10: 125 ug/h via INTRAVENOUS
  Administered 2020-07-10: 200 ug/h via INTRAVENOUS
  Administered 2020-07-11: 125 ug/h via INTRAVENOUS
  Filled 2020-07-04 (×12): qty 250

## 2020-07-04 MED ORDER — IOHEXOL 300 MG/ML  SOLN
150.0000 mL | Freq: Once | INTRAMUSCULAR | Status: AC | PRN
  Administered 2020-07-04: 30 mL

## 2020-07-04 MED ORDER — PROPOFOL 500 MG/50ML IV EMUL
INTRAVENOUS | Status: DC | PRN
  Administered 2020-07-04: 60 ug/kg/min via INTRAVENOUS

## 2020-07-04 MED ORDER — INSULIN ASPART 100 UNIT/ML ~~LOC~~ SOLN
SUBCUTANEOUS | Status: DC | PRN
  Administered 2020-07-04: 9 [IU] via SUBCUTANEOUS
  Administered 2020-07-04: 10 [IU] via SUBCUTANEOUS

## 2020-07-04 MED ORDER — FENTANYL CITRATE (PF) 100 MCG/2ML IJ SOLN
INTRAMUSCULAR | Status: AC
  Administered 2020-07-04: 75 ug via INTRAVENOUS
  Filled 2020-07-04: qty 2

## 2020-07-04 MED ORDER — FENTANYL CITRATE (PF) 100 MCG/2ML IJ SOLN: 50.0000 ug | INTRAMUSCULAR | Status: DC | PRN

## 2020-07-04 MED ORDER — POTASSIUM CHLORIDE IN NACL 40-0.9 MEQ/L-% IV SOLN
INTRAVENOUS | Status: DC
  Filled 2020-07-04 (×3): qty 1000

## 2020-07-04 MED ORDER — ACETAMINOPHEN 160 MG/5ML PO SOLN
650.0000 mg | ORAL | Status: DC | PRN
Start: 2020-07-04 — End: 2020-08-03
  Administered 2020-07-06 – 2020-07-29 (×25): 650 mg
  Filled 2020-07-04 (×24): qty 20.3

## 2020-07-04 MED ORDER — PANTOPRAZOLE SODIUM 40 MG IV SOLR
40.0000 mg | Freq: Every day | INTRAVENOUS | Status: DC
  Administered 2020-07-04 – 2020-07-05 (×2): 40 mg via INTRAVENOUS
  Filled 2020-07-04 (×2): qty 40

## 2020-07-04 MED ORDER — SENNOSIDES-DOCUSATE SODIUM 8.6-50 MG PO TABS: 1.0000 | ORAL_TABLET | Freq: Every evening | ORAL | Status: DC | PRN

## 2020-07-04 MED ORDER — POTASSIUM CHLORIDE CRYS ER 20 MEQ PO TBCR: 40.0000 meq | EXTENDED_RELEASE_TABLET | ORAL | Status: DC

## 2020-07-04 MED ORDER — POLYETHYLENE GLYCOL 3350 17 G PO PACK
17.0000 g | PACK | Freq: Every day | ORAL | Status: DC
  Administered 2020-07-05 – 2020-08-03 (×22): 17 g
  Filled 2020-07-04 (×23): qty 1

## 2020-07-04 MED ORDER — PROPOFOL 1000 MG/100ML IV EMUL
INTRAVENOUS | Status: AC
  Administered 2020-07-04: 40 mg via INTRAVENOUS
  Filled 2020-07-04: qty 100

## 2020-07-04 MED ORDER — INSULIN ASPART 100 UNIT/ML ~~LOC~~ SOLN
SUBCUTANEOUS | Status: AC
  Administered 2020-07-05: 3 [IU] via SUBCUTANEOUS
  Filled 2020-07-04: qty 1

## 2020-07-04 MED ORDER — PHENYLEPHRINE HCL-NACL 10-0.9 MG/250ML-% IV SOLN
INTRAVENOUS | Status: DC | PRN
  Administered 2020-07-04: 25 ug/min via INTRAVENOUS

## 2020-07-04 MED ORDER — FENTANYL 2500MCG IN NS 250ML (10MCG/ML) PREMIX INFUSION
25.0000 ug/h | INTRAVENOUS | Status: DC
  Filled 2020-07-04: qty 250

## 2020-07-04 MED ORDER — SODIUM CHLORIDE 0.9 % IV SOLN: INTRAVENOUS | Status: DC

## 2020-07-04 MED ORDER — ROCURONIUM 10MG/ML (10ML) SYRINGE FOR MEDFUSION PUMP - OPTIME
INTRAVENOUS | Status: DC | PRN
  Administered 2020-07-04: 50 mg via INTRAVENOUS
  Administered 2020-07-04: 100 mg via INTRAVENOUS

## 2020-07-04 MED ORDER — TICAGRELOR 90 MG PO TABS
ORAL_TABLET | ORAL | Status: AC
  Filled 2020-07-04: qty 2

## 2020-07-04 MED ORDER — ORAL CARE MOUTH RINSE
15.0000 mL | OROMUCOSAL | Status: DC
  Administered 2020-07-04 – 2020-07-16 (×113): 15 mL via OROMUCOSAL

## 2020-07-04 MED ORDER — INSULIN DETEMIR 100 UNIT/ML ~~LOC~~ SOLN
15.0000 [IU] | Freq: Two times a day (BID) | SUBCUTANEOUS | Status: DC
  Administered 2020-07-04 – 2020-07-06 (×4): 15 [IU] via SUBCUTANEOUS
  Filled 2020-07-04 (×8): qty 0.15

## 2020-07-04 MED ORDER — FENTANYL CITRATE (PF) 100 MCG/2ML IJ SOLN
50.0000 ug | Freq: Once | INTRAMUSCULAR | Status: AC
  Administered 2020-07-04: 50 ug via INTRAVENOUS
  Filled 2020-07-04: qty 2

## 2020-07-04 MED ORDER — SODIUM CHLORIDE 0.9 % IV SOLN: 50.0000 mL | Freq: Once | INTRAVENOUS | Status: DC

## 2020-07-04 MED ORDER — LACTATED RINGERS IV SOLN: INTRAVENOUS | Status: DC | PRN

## 2020-07-04 MED ORDER — CHLORHEXIDINE GLUCONATE 0.12% ORAL RINSE (MEDLINE KIT)
15.0000 mL | Freq: Two times a day (BID) | OROMUCOSAL | Status: DC
  Administered 2020-07-04 – 2020-08-03 (×57): 15 mL via OROMUCOSAL

## 2020-07-04 MED ORDER — CHLORHEXIDINE GLUCONATE 0.12% ORAL RINSE (MEDLINE KIT): 15.0000 mL | Freq: Two times a day (BID) | OROMUCOSAL | Status: DC

## 2020-07-04 MED ORDER — GABAPENTIN 300 MG PO CAPS: 600.0000 mg | ORAL_CAPSULE | Freq: Two times a day (BID) | ORAL | Status: DC

## 2020-07-04 MED ORDER — CLEVIDIPINE BUTYRATE 0.5 MG/ML IV EMUL: 0.0000 mg/h | INTRAVENOUS | Status: DC

## 2020-07-04 MED ORDER — NITROGLYCERIN 1 MG/10 ML FOR IR/CATH LAB
INTRA_ARTERIAL | Status: AC
  Filled 2020-07-04: qty 10

## 2020-07-04 MED ORDER — FENTANYL BOLUS VIA INFUSION
50.0000 ug | INTRAVENOUS | Status: DC | PRN
  Filled 2020-07-04: qty 50

## 2020-07-04 MED ORDER — IOHEXOL 350 MG/ML SOLN
100.0000 mL | Freq: Once | INTRAVENOUS | Status: AC | PRN
  Administered 2020-07-04: 100 mL via INTRAVENOUS

## 2020-07-04 MED ORDER — STROKE: EARLY STAGES OF RECOVERY BOOK
Freq: Once | Status: AC
  Filled 2020-07-04: qty 1

## 2020-07-04 MED ORDER — VERAPAMIL HCL 2.5 MG/ML IV SOLN
INTRAVENOUS | Status: AC
  Filled 2020-07-04: qty 2

## 2020-07-04 MED ORDER — DOCUSATE SODIUM 50 MG/5ML PO LIQD
100.0000 mg | Freq: Two times a day (BID) | ORAL | Status: DC
  Administered 2020-07-04 – 2020-08-03 (×43): 100 mg
  Filled 2020-07-04 (×46): qty 10

## 2020-07-04 MED ORDER — EPTIFIBATIDE 20 MG/10ML IV SOLN
INTRAVENOUS | Status: AC
  Filled 2020-07-04: qty 10

## 2020-07-04 MED ORDER — CLOPIDOGREL BISULFATE 300 MG PO TABS
ORAL_TABLET | ORAL | Status: AC
  Filled 2020-07-04: qty 1

## 2020-07-04 MED ORDER — CEFAZOLIN SODIUM-DEXTROSE 2-3 GM-%(50ML) IV SOLR
INTRAVENOUS | Status: DC | PRN
  Administered 2020-07-04: 2 g via INTRAVENOUS

## 2020-07-04 MED ORDER — IOHEXOL 240 MG/ML SOLN
INTRAMUSCULAR | Status: AC
  Filled 2020-07-04: qty 200

## 2020-07-04 MED ORDER — SODIUM CHLORIDE 0.9 % IV BOLUS
1000.0000 mL | Freq: Once | INTRAVENOUS | Status: AC
  Administered 2020-07-04: 1000 mL via INTRAVENOUS

## 2020-07-04 MED ORDER — IOHEXOL 300 MG/ML  SOLN
150.0000 mL | Freq: Once | INTRAMUSCULAR | Status: AC | PRN
  Administered 2020-07-04: 75 mL

## 2020-07-04 MED ORDER — TIROFIBAN HCL IN NACL 5-0.9 MG/100ML-% IV SOLN
INTRAVENOUS | Status: AC
  Filled 2020-07-04: qty 100

## 2020-07-04 MED ORDER — MIDAZOLAM HCL 2 MG/2ML IJ SOLN
2.0000 mg | INTRAMUSCULAR | Status: DC | PRN
  Administered 2020-07-07 – 2020-07-12 (×3): 2 mg via INTRAVENOUS
  Filled 2020-07-04 (×3): qty 2

## 2020-07-04 MED ORDER — SODIUM CHLORIDE (PF) 0.9 % IJ SOLN
INTRAVENOUS | Status: AC | PRN
  Administered 2020-07-04: 25 ug via INTRA_ARTERIAL

## 2020-07-04 MED ORDER — CEFAZOLIN SODIUM-DEXTROSE 2-4 GM/100ML-% IV SOLN
INTRAVENOUS | Status: AC
  Filled 2020-07-04: qty 100

## 2020-07-04 MED ORDER — ORAL CARE MOUTH RINSE: 15.0000 mL | OROMUCOSAL | Status: DC

## 2020-07-04 MED ORDER — PROPOFOL 1000 MG/100ML IV EMUL
0.0000 ug/kg/min | INTRAVENOUS | Status: DC
  Administered 2020-07-04: 50 ug/kg/min via INTRAVENOUS
  Administered 2020-07-05: 40 ug/kg/min via INTRAVENOUS
  Administered 2020-07-05: 45 ug/kg/min via INTRAVENOUS
  Administered 2020-07-05: 35 ug/kg/min via INTRAVENOUS
  Filled 2020-07-04 (×6): qty 100

## 2020-07-04 MED ORDER — LABETALOL HCL 5 MG/ML IV SOLN: 10.0000 mg | INTRAVENOUS | Status: DC | PRN

## 2020-07-04 MED ORDER — CANGRELOR TETRASODIUM 50 MG IV SOLR
INTRAVENOUS | Status: AC
  Filled 2020-07-04: qty 50

## 2020-07-04 MED ORDER — CLEVIDIPINE BUTYRATE 0.5 MG/ML IV EMUL
0.0000 mg/h | INTRAVENOUS | Status: AC
  Administered 2020-07-05: 1 mg/h via INTRAVENOUS
  Administered 2020-07-05: 7 mg/h via INTRAVENOUS
  Filled 2020-07-04 (×4): qty 50

## 2020-07-04 MED ORDER — PROPOFOL 10 MG/ML IV BOLUS: 40.0000 mg | Freq: Once | INTRAVENOUS | Status: AC

## 2020-07-04 MED ORDER — FENTANYL CITRATE (PF) 100 MCG/2ML IJ SOLN
50.0000 ug | INTRAMUSCULAR | Status: DC | PRN
  Administered 2020-07-04: 200 ug via INTRAVENOUS
  Administered 2020-07-04 – 2020-07-07 (×7): 100 ug via INTRAVENOUS
  Filled 2020-07-04 (×5): qty 2
  Filled 2020-07-04: qty 4
  Filled 2020-07-04 (×3): qty 2

## 2020-07-04 MED ORDER — POTASSIUM CHLORIDE 20 MEQ PO PACK
40.0000 meq | PACK | ORAL | Status: AC
  Administered 2020-07-04: 40 meq
  Filled 2020-07-04: qty 2

## 2020-07-04 MED ORDER — ASPIRIN 81 MG PO CHEW
CHEWABLE_TABLET | ORAL | Status: AC
  Filled 2020-07-04: qty 1

## 2020-07-04 MED ORDER — VERAPAMIL HCL 2.5 MG/ML IV SOLN
INTRAVENOUS | Status: AC | PRN
  Administered 2020-07-04: 2.5 mg via INTRAVENOUS

## 2020-07-04 MED ORDER — PROPOFOL 1000 MG/100ML IV EMUL
0.0000 ug/kg/min | INTRAVENOUS | Status: DC
  Administered 2020-07-04: 5 ug/kg/min via INTRAVENOUS

## 2020-07-04 MED ORDER — FENTANYL CITRATE (PF) 100 MCG/2ML IJ SOLN: 75.0000 ug | Freq: Once | INTRAMUSCULAR | Status: AC

## 2020-07-04 MED ORDER — INSULIN ASPART 100 UNIT/ML ~~LOC~~ SOLN
0.0000 [IU] | SUBCUTANEOUS | Status: DC
  Administered 2020-07-04: 3 [IU] via SUBCUTANEOUS
  Administered 2020-07-05 (×2): 2 [IU] via SUBCUTANEOUS
  Administered 2020-07-05: 11 [IU] via SUBCUTANEOUS
  Administered 2020-07-05: 3 [IU] via SUBCUTANEOUS
  Administered 2020-07-06: 2 [IU] via SUBCUTANEOUS
  Administered 2020-07-06: 3 [IU] via SUBCUTANEOUS
  Administered 2020-07-06: 8 [IU] via SUBCUTANEOUS
  Administered 2020-07-06: 11 [IU] via SUBCUTANEOUS
  Administered 2020-07-07 (×2): 8 [IU] via SUBCUTANEOUS
  Administered 2020-07-07: 5 [IU] via SUBCUTANEOUS
  Administered 2020-07-07: 8 [IU] via SUBCUTANEOUS
  Administered 2020-07-07 – 2020-07-08 (×2): 5 [IU] via SUBCUTANEOUS
  Administered 2020-07-08 (×2): 3 [IU] via SUBCUTANEOUS
  Administered 2020-07-08: 2 [IU] via SUBCUTANEOUS
  Administered 2020-07-08: 3 [IU] via SUBCUTANEOUS
  Administered 2020-07-09 – 2020-07-11 (×7): 2 [IU] via SUBCUTANEOUS
  Administered 2020-07-11: 3 [IU] via SUBCUTANEOUS
  Administered 2020-07-11 (×2): 2 [IU] via SUBCUTANEOUS
  Administered 2020-07-11: 5 [IU] via SUBCUTANEOUS
  Administered 2020-07-11 – 2020-07-12 (×5): 2 [IU] via SUBCUTANEOUS
  Administered 2020-07-13 – 2020-07-14 (×5): 3 [IU] via SUBCUTANEOUS
  Administered 2020-07-14: 5 [IU] via SUBCUTANEOUS
  Administered 2020-07-14: 3 [IU] via SUBCUTANEOUS
  Administered 2020-07-14: 5 [IU] via SUBCUTANEOUS
  Administered 2020-07-14: 3 [IU] via SUBCUTANEOUS
  Administered 2020-07-14: 2 [IU] via SUBCUTANEOUS
  Administered 2020-07-15: 8 [IU] via SUBCUTANEOUS
  Administered 2020-07-15 – 2020-07-16 (×7): 2 [IU] via SUBCUTANEOUS
  Administered 2020-07-16: 3 [IU] via SUBCUTANEOUS
  Administered 2020-07-16: 2 [IU] via SUBCUTANEOUS
  Administered 2020-07-17 (×2): 3 [IU] via SUBCUTANEOUS
  Administered 2020-07-17: 2 [IU] via SUBCUTANEOUS
  Administered 2020-07-17 (×2): 5 [IU] via SUBCUTANEOUS
  Administered 2020-07-17: 3 [IU] via SUBCUTANEOUS
  Administered 2020-07-18: 5 [IU] via SUBCUTANEOUS
  Administered 2020-07-18 (×2): 3 [IU] via SUBCUTANEOUS
  Administered 2020-07-18: 8 [IU] via SUBCUTANEOUS
  Administered 2020-07-18: 2 [IU] via SUBCUTANEOUS
  Administered 2020-07-19: 3 [IU] via SUBCUTANEOUS
  Administered 2020-07-19: 2 [IU] via SUBCUTANEOUS
  Administered 2020-07-19: 5 [IU] via SUBCUTANEOUS
  Administered 2020-07-19: 2 [IU] via SUBCUTANEOUS
  Administered 2020-07-20: 3 [IU] via SUBCUTANEOUS
  Administered 2020-07-20 – 2020-07-22 (×7): 2 [IU] via SUBCUTANEOUS
  Administered 2020-07-23: 5 [IU] via SUBCUTANEOUS
  Administered 2020-07-23: 3 [IU] via SUBCUTANEOUS
  Administered 2020-07-23 (×2): 2 [IU] via SUBCUTANEOUS
  Administered 2020-07-24: 6 [IU] via SUBCUTANEOUS
  Administered 2020-07-24: 2 [IU] via SUBCUTANEOUS
  Administered 2020-07-24: 3 [IU] via SUBCUTANEOUS
  Administered 2020-07-25: 2 [IU] via SUBCUTANEOUS
  Administered 2020-07-25 – 2020-07-26 (×5): 3 [IU] via SUBCUTANEOUS
  Administered 2020-07-26: 2 [IU] via SUBCUTANEOUS
  Administered 2020-07-27: 3 [IU] via SUBCUTANEOUS
  Administered 2020-07-27: 5 [IU] via SUBCUTANEOUS
  Administered 2020-07-27 (×2): 3 [IU] via SUBCUTANEOUS
  Administered 2020-07-27: 8 [IU] via SUBCUTANEOUS
  Administered 2020-07-28: 3 [IU] via SUBCUTANEOUS
  Administered 2020-07-28: 2 [IU] via SUBCUTANEOUS
  Administered 2020-07-28: 3 [IU] via SUBCUTANEOUS
  Administered 2020-07-28: 5 [IU] via SUBCUTANEOUS
  Administered 2020-07-28: 3 [IU] via SUBCUTANEOUS
  Administered 2020-07-28: 5 [IU] via SUBCUTANEOUS
  Administered 2020-07-29 (×2): 3 [IU] via SUBCUTANEOUS
  Administered 2020-07-29: 5 [IU] via SUBCUTANEOUS
  Administered 2020-07-29 – 2020-07-30 (×3): 3 [IU] via SUBCUTANEOUS
  Administered 2020-07-30: 2 [IU] via SUBCUTANEOUS
  Administered 2020-07-30: 3 [IU] via SUBCUTANEOUS
  Administered 2020-07-31: 2 [IU] via SUBCUTANEOUS
  Administered 2020-07-31: 3 [IU] via SUBCUTANEOUS
  Administered 2020-07-31: 5 [IU] via SUBCUTANEOUS
  Administered 2020-07-31 (×2): 3 [IU] via SUBCUTANEOUS
  Administered 2020-07-31 – 2020-08-01 (×2): 5 [IU] via SUBCUTANEOUS
  Administered 2020-08-01: 2 [IU] via SUBCUTANEOUS
  Administered 2020-08-01 (×2): 3 [IU] via SUBCUTANEOUS
  Administered 2020-08-01 (×2): 2 [IU] via SUBCUTANEOUS
  Administered 2020-08-02 – 2020-08-03 (×5): 3 [IU] via SUBCUTANEOUS
  Administered 2020-08-03: 2 [IU] via SUBCUTANEOUS

## 2020-07-04 MED ORDER — ALTEPLASE (STROKE) FULL DOSE INFUSION
0.9000 mg/kg | Freq: Once | INTRAVENOUS | Status: AC
  Administered 2020-07-04: 72 mg via INTRAVENOUS
  Filled 2020-07-04: qty 100

## 2020-07-04 MED ORDER — LABETALOL HCL 5 MG/ML IV SOLN
INTRAVENOUS | Status: AC
  Filled 2020-07-04: qty 4

## 2020-07-04 MED ORDER — ACETAMINOPHEN 325 MG PO TABS
650.0000 mg | ORAL_TABLET | ORAL | Status: DC | PRN
  Filled 2020-07-04 (×2): qty 2

## 2020-07-04 MED ORDER — GABAPENTIN 250 MG/5ML PO SOLN
600.0000 mg | Freq: Two times a day (BID) | ORAL | Status: DC
  Administered 2020-07-04 – 2020-07-22 (×34): 600 mg
  Filled 2020-07-04 (×42): qty 12

## 2020-07-04 MED ORDER — ACETAMINOPHEN 650 MG RE SUPP
650.0000 mg | RECTAL | Status: DC | PRN
Start: 2020-07-04 — End: 2020-08-03

## 2020-07-04 MED ORDER — GABAPENTIN 250 MG/5ML PO SOLN
600.0000 mg | Freq: Two times a day (BID) | ORAL | Status: DC
  Filled 2020-07-04: qty 12

## 2020-07-04 NOTE — H&P (Signed)
Admission H&P    Chief Complaint: Acute onset of left sided weakness with slurred speech  HPI: HARRY BARK is an 35 y.o. female with a PMHx of HTN and uncontrolled DM who initially presented to [facility] with acute left sided weakness and slurred speech. LKN was 11:30 AM. Code Stroke was activated at [facility] and she was evaluated by Dr. Iver Nestle. Overall exam findings were most consistent with an acute right MCA stroke. IV tPA was administered although there was a significant delay necessitated by awaiting PT/INR to confirm these were within goal given unknown substance use and potential for K2 exposure. 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  CT perfusion imaging demonstrated possible core infarct of 51 mL (though notably this was obtained within 2 hours of symptom onset and therefore may reflect an overestimate of the true core).  Mismatch ratio was 2 with a penumbra of 63 mL. Decision was made to transfer to [facility] for emergent thrombectomy.  Per Dr. Rollene Fare note from 12:59 PM today: "Per family there is a complicated social situation at this time.  She lives intermittently in an apartment by herself but was kicked out and they went to pick her up at 5:30 AM.  At that point she seemed "off" and generally quiet but did not seem to have any difficulty with left-sided weakness.  Family attributed this to psychosocial issues and potential substance use noting that her sister passed away from an overdose in October.  Later in the morning she complained of diffuse body pain and was administered gabapentin and a muscle relaxer.  At 11:30 AM she stood up and walked to the shower and at 1215 she was found on the bottom of the tub with slurred speech and difficulty moving the left side. She was not preactivated by EMS, but on arrival in the ED clearly appear to have a doorway examination consistent  with a right MCA territory infarct.  Dry head CT was obtained which was notable for aspects of 9 due to right lentiform nucleus haziness.  Initial CTA could not be obtained due to patient movement and she had to be intubated to obtain a diagnostic scan which was a significant delay.  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  CT perfusion imaging demonstrated possible core infarct of 51 mL (though notably this was obtained within 2 hours of symptom onset and therefore may reflect an overestimate of the true core).  Mismatch ratio was 2 with a penumbra of 63 mL.  Case was discussed with Dr. Corliss Skains of neuro interventional radiology and Dr. Otelia Limes of [facility] and the patient was accepted for transfer to [facility] for emergent intervention. She was also started on TPA after consenting the patient's emergency contact with full discussion of risks of bleeding and importance of accurately determining the patient's last known well.  Ms. Marikay Alar confirm the last known well as described above and agreed that the patient would want TPA given the risk/benefit.  She confirmed that the patient had no recent procedures or other contraindications to TPA. Regarding her baseline, she is functional at baseline and family believes she is smoking at least 1 pack a day currently.  She is on Metformin for her diabetes, does not take oral contraceptive pills,  has had no recent surgeries or procedures, has not had any complaints of bleeding and family believes that she would have informed them of any significant bleeding. On chart review she has noted to have a significant decline in her hemoglobin from baseline 2 years ago.  However even at that time she had microcytic anemia and this was felt to likely be a chronic process given no evidence of bleeding on examination.  She did begin to have some slight  vaginal bleeding after TPA administration."  LSN: 11:30 AM tPA Given: Yes mRS: 0  On arrival to [facility] the patient is intubated and sedated, moving only her right side to noxious stimulation. She does not open her eyes to any stimuli.   Past Medical History:  Diagnosis Date  . Diabetes mellitus without complication (HCC)   . Hypertension     Past Surgical History:  Procedure Laterality Date  . CESAREAN SECTION    . CHOLECYSTECTOMY      Family History  Problem Relation Age of Onset  . Diabetes Maternal Grandmother    Social History:  reports that she has been smoking cigarettes. She has been smoking about 0.10 packs per day. She has never used smokeless tobacco. She reports current alcohol use. She reports that she does not use drugs.  Allergies:  Allergies  Allergen Reactions  . Norco [Hydrocodone-Acetaminophen] Itching, Nausea And Vomiting and Swelling    ROS: Unable to obtain due to sedation and intubation.    Physical Examination: There were no vitals taken for this visit.  HEENT-  [place]/AT Lungs - Intubated Extremities - No edema  Neurologic Examination: Mental Status: Intubated and sedated. Does not attempt to communicate. Does not follow any commands. Does not open eyes to any stimuli. Will thrash RUE and RLE to noxious stimuli semipurposefully. Cranial Nerves: II:  No blink to threat. Pupils equal and sluggishly reactive.  III,IV, VI: Eyes are midline. No doll's eye reflex in the context of sedation.  V,VII: Face flaccidly symmetric in the context of intubation.  VIII: No response to voice IX,X: Intubated XI: Head preferentially rotated to the right XII: Intubated Motor: RUE and RLE with normal strength while thrashing semipurposefully spontaneously as well as to noxious stimuli.  LUE and LLE with weak adduction and internal rotation to pinch after a delay.  Sensory: Reacts briskly to noxious stimuli applied to RUE and RLE. After a delay, she postures with  right arm and leg when pinched on the left arm and leg.  Deep Tendon Reflexes:  Slightly hyperactive on the left.  Plantars: Right: downgoing   Left: upgoing Cerebellar/Gait: Unable to assess   Results for orders placed or performed during the hospital encounter of 07/04/20 (from the past 48 hour(s))  Ethanol     Status: None   Collection Time: 07/04/20 12:47 PM  Result Value Ref Range   Alcohol, Ethyl (B) <10 <10 mg/dL    Comment: (NOTE) Lowest detectable limit for serum alcohol is 10 mg/dL.  For medical purposes only. Performed at [facility], [address] [place], [place] [address]   CBC     Status: Abnormal   Collection Time: 07/04/20 12:47 PM  Result Value Ref Range   WBC 17.8 (H) 4.0 - 10.5 K/uL   RBC 5.21 (H) 3.87 - 5.11 MIL/uL   Hemoglobin 9.5 (L) 12.0 - 15.0 g/dL   HCT 60.4 (L) 54.0 - 98.1 %   MCV 67.2 (L) 80.0 - 100.0 fL   MCH 18.2 (L) 26.0 -  34.0 pg   MCHC 27.1 (L) 30.0 - 36.0 g/dL   RDW 40.9 (H) 81.1 - 91.4 %   Platelets 235 150 - 400 K/uL   nRBC 0.0 0.0 - 0.2 %    Comment: Performed at [facility], [address] [place], [place] [address]  Differential     Status: Abnormal   Collection Time: 07/04/20 12:47 PM  Result Value Ref Range   Neutrophils Relative % 71 %   Neutro Abs 12.7 (H) 1.7 - 7.7 K/uL   Lymphocytes Relative 18 %   Lymphs Abs 3.2 0.7 - 4.0 K/uL   Monocytes Relative 8 %   Monocytes Absolute 1.4 (H) 0.1 - 1.0 K/uL   Eosinophils Relative 2 %   Eosinophils Absolute 0.3 0.0 - 0.5 K/uL   Basophils Relative 0 %   Basophils Absolute 0.1 0.0 - 0.1 K/uL   Immature Granulocytes 1 %   Abs Immature Granulocytes 0.14 (H) 0.00 - 0.07 K/uL    Comment: Performed at [facility], [address] [place], [place] [address]  hCG, quantitative, pregnancy     Status: None   Collection Time: 07/04/20 12:47 PM  Result Value Ref Range   hCG, Beta Chain, Quant, S <1 <5 mIU/mL    Comment:          GEST. AGE      CONC.   (mIU/mL)   <=1 WEEK        5 - 50     2 WEEKS       50 - 500     3 WEEKS       100 - 10,000     4 WEEKS     1,000 - 30,000     5 WEEKS     3,500 - 115,000   6-8 WEEKS     12,000 - 270,000    12 WEEKS     15,000 - 220,000        FEMALE AND NON-PREGNANT FEMALE:     LESS THAN 5 mIU/mL Performed at [facility], [address] [place], [place] [address]   Blood gas, venous     Status: Abnormal   Collection Time: 07/04/20  1:07 PM  Result Value Ref Range   pH, Ven 7.41 7.250 - 7.430   pCO2, Ven 37 (L) 44.0 - 60.0 mmHg   pO2, Ven 138.0 (H) 32.0 - 45.0 mmHg   Bicarbonate 23.5 20.0 - 28.0 mmol/L   Acid-base deficit 0.9 0.0 - 2.0 mmol/L   O2 Saturation 99.1 %   Patient temperature 37.0    Collection site VEIN    Sample type VENIPUNCTURE     Comment: Performed at [facility], [address] [place], [place] [address]  Protime-INR     Status: None   Collection Time: 07/04/20  1:37 PM  Result Value Ref Range   Prothrombin Time 14.3 11.4 - 15.2 seconds   INR 1.2 0.8 - 1.2    Comment: (NOTE) INR goal varies based on device and disease states. Performed at [facility], [address] [place], [place] [address]   APTT     Status: None   Collection Time: 07/04/20  1:37 PM  Result Value Ref Range   aPTT 24 24 - 36 seconds    Comment: Performed at [facility], [address] [place], [place] [address]  Comprehensive metabolic panel     Status: Abnormal   Collection Time: 07/04/20  1:37 PM  Result Value Ref Range   Sodium 133 (L) 135 - 145 mmol/L   Potassium 2.9 (L) 3.5 - 5.1 mmol/L   Chloride 99 98 - 111 mmol/L   CO2 21 (L) 22 - 32 mmol/L   Glucose, Bld 448 (H) 70 - 99 mg/dL    Comment: Glucose reference range applies only to samples taken after fasting for at least 8 hours.   BUN <5 (L) 6 - 20 mg/dL   Creatinine, Ser 1.61 0.44 - 1.00 mg/dL   Calcium 8.3 (L) 8.9 - 10.3 mg/dL   Total Protein 6.6 6.5 - 8.1 g/dL   Albumin 3.3 (L) 3.5 - 5.0  g/dL   AST 23 15 - 41 U/L   ALT 14 0 - 44 U/L   Alkaline Phosphatase 84 38 - 126 U/L   Total Bilirubin 1.0 0.3 - 1.2 mg/dL   GFR, Estimated >09 >60 mL/min    Comment: (NOTE) Calculated using the CKD-EPI Creatinine Equation (2021)    Anion gap 13 5 - 15    Comment: Performed at [facility], [address] [place], [place] [address]  CBG monitoring, ED     Status: Abnormal   Collection Time: 07/04/20  1:40 PM  Result Value Ref Range   Glucose-Capillary 412 (H) 70 - 99 mg/dL    Comment: Glucose reference range applies only to samples taken after fasting for at least 8 hours.  Resp Panel by RT-PCR (Flu A&B, Covid) Nasopharyngeal Swab     Status: None   Collection Time: 07/04/20  1:48 PM   Specimen: Nasopharyngeal Swab; Nasopharyngeal(NP) swabs in vial transport medium  Result Value Ref Range   SARS Coronavirus 2 by RT PCR NEGATIVE NEGATIVE    Comment: (NOTE) SARS-CoV-2 target nucleic acids are NOT DETECTED.  The SARS-CoV-2 RNA is generally detectable in upper respiratory specimens during the acute phase of infection. The lowest concentration of SARS-CoV-2 viral copies this assay can detect is 138 copies/mL. A negative result does not preclude SARS-Cov-2 infection and should not be used as the sole basis for treatment or other patient management decisions. A negative result may occur with  improper specimen collection/handling, submission of specimen other than nasopharyngeal swab, presence of viral mutation(s) within the areas targeted by this assay, and inadequate number of viral copies(<138 copies/mL). A negative result must be combined with clinical observations, patient history, and epidemiological information. The expected result is Negative.  Fact Sheet for Patients:  BloggerCourse.com  Fact Sheet for Healthcare Providers:  SeriousBroker.it  This test is no t yet approved or cleared by the [place] FDA and   has been authorized for detection and/or diagnosis of SARS-CoV-2 by FDA under an Emergency Use Authorization (EUA). This EUA will remain  in effect (meaning this test can be used) for the duration of the COVID-19 declaration under Section 564(b)(1) of the Act, 21 U.S.C.section 360bbb-3(b)(1), unless the authorization is terminated  or revoked sooner.       Influenza A by PCR NEGATIVE NEGATIVE   Influenza B by PCR NEGATIVE NEGATIVE    Comment: (NOTE) The Xpert Xpress SARS-CoV-2/FLU/RSV plus assay is intended as an aid in the diagnosis of influenza from Nasopharyngeal swab specimens and should not be used as a sole basis for treatment. Nasal washings and aspirates are unacceptable for Xpert Xpress SARS-CoV-2/FLU/RSV testing.  Fact Sheet for Patients: BloggerCourse.com  Fact Sheet for Healthcare Providers: SeriousBroker.it  This test is not yet approved or cleared by the [place] and has been authorized  for detection and/or diagnosis of SARS-CoV-2 by FDA under an Emergency Use Authorization (EUA). This EUA will remain in effect (meaning this test can be used) for the duration of the COVID-19 declaration under Section 564(b)(1) of the Act, 21 U.S.C. section 360bbb-3(b)(1), unless the authorization is terminated or revoked.  Performed at [facility], [address] [place], [place] [address]    CT Angio Head W/Cm &/Or Wo Cm  Result Date: 07/04/2020 CLINICAL DATA:  Left-sided weakness and slurred speech EXAM: CT HEAD WITHOUT CONTRAST CT ANGIOGRAPHY HEAD AND NECK CT PERFUSION BRAIN TECHNIQUE: Multi detector CT imaging of the head was performed using the standard protocol without intravenous contrast. Multidetector CT imaging of the head and neck was performed using the standard protocol during bolus administration of intravenous contrast. Multiplanar CT image reconstructions and MIPs were obtained to evaluate the  vascular anatomy. Carotid stenosis measurements (when applicable) are obtained utilizing NASCET criteria, using the distal internal carotid diameter as the denominator. Multiphase CT imaging of the brain was performed following IV bolus contrast injection. Subsequent parametric perfusion maps were calculated using RAPID software. CONTRAST:  OMNIPAQUE IOHEXOL 350 MG/ML SOLN COMPARISON:  None. FINDINGS: CT HEAD FINDINGS Brain: There is no acute intracranial hemorrhage or mass effect. Loss of gray-white differentiation involving a portion of the right lentiform nucleus. Ventricles and sulci are normal in size and configuration. No extra-axial collection. Vascular: No hyperdense vessel. Skull: Unremarkable. Sinuses/Orbits: Lobular maxillary sinus mucosal thickening. Orbits are unremarkable. Other: Minor patchy left mastoid tip opacification. ASPECTS [facility] Stroke Program Early CT Score) - Ganglionic level infarction (caudate, lentiform nuclei, internal capsule, insula, M1-M3 cortex): 6 - Supraganglionic infarction (M4-M6 cortex): 3 Total score (0-10 with 10 being normal): 9 CTA NECK FINDINGS Aortic arch: Normal caliber aorta.  Great vessel origins are patent. Right carotid system: Common carotid is patent. There is thrombus at the bifurcation extending into the external carotid. Less than 50% stenosis at the ICA origin. The cervical ICA is relatively smaller in caliber. Left carotid system: Patent.  No stenosis. Vertebral arteries: Patent. Right vertebral artery slightly dominant. No stenosis. Skeleton: Unremarkable. Other neck: Thyroid not well evaluated due to streak artifact. Likely reactive cervical lymph nodes. Upper chest: Included lungs are clear. Endotracheal tube is present. Review of the MIP images confirms the above findings CTA HEAD FINDINGS Anterior circulation: Proximal intracranial internal carotid artery is patent. There is loss of enhancement at the level of the supraclinoid portion. Faint  enhancement is seen along the right M1 MCA. There is reconstitution of M2 branches. Patent right A1 ACA. Anterior communicating artery is present. Left anterior and middle cerebral arteries are patent. Posterior circulation: Intracranial vertebral arteries, basilar artery, and posterior cerebral arteries are patent. Venous sinuses: As permitted by contrast timing, patent. Review of the MIP images confirms the above findings CT Brain Perfusion Findings: CBF (<30%) Volume: 51mL Perfusion (Tmax>6.0s) volume: Mismatch Volume: 63mL Infarction Location: Right MCA territory IMPRESSION: No acute intracranial hemorrhage. 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 Reconstitution of right MCA at the bifurcation. Right A1 ACA is patent. Anterior communicating artery is present. No posterior communicating artery is identified. Perfusion imaging demonstrates core infarction of 51 mL. This is greater than anticipated by noncontrast CT. Penumbra is 63 mL and therefore may be greater than  calculated. Initial results were called by telephone at the time of interpretation on 07/04/2020 at 1:58 pm to provider Dr. Iver Nestle, who verbally acknowledged these results. Electronically Signed   By: Guadlupe Spanish M.D.   On: 07/04/2020 14:10   DG Chest 1 View  Result Date: 07/04/2020 CLINICAL DATA:  Intubation EXAM: CHEST  1 VIEW COMPARISON:  01/12/2016 FINDINGS: Endotracheal tube is 3.6 cm above the carina. NG tube is in the stomach. No confluent opacities or effusions. Heart is normal size. No acute bony abnormality. IMPRESSION: Support devices in expected position. No acute cardiopulmonary disease. Electronically Signed   By: Charlett Nose M.D.   On: 07/04/2020 15:09   CT Angio  Neck W and/or Wo Contrast  Result Date: 07/04/2020 CLINICAL DATA:  Left-sided weakness and slurred speech EXAM: CT HEAD WITHOUT CONTRAST CT ANGIOGRAPHY HEAD AND NECK CT PERFUSION BRAIN TECHNIQUE: Multi detector CT imaging of the head was performed using the standard protocol without intravenous contrast. Multidetector CT imaging of the head and neck was performed using the standard protocol during bolus administration of intravenous contrast. Multiplanar CT image reconstructions and MIPs were obtained to evaluate the vascular anatomy. Carotid stenosis measurements (when applicable) are obtained utilizing NASCET criteria, using the distal internal carotid diameter as the denominator. Multiphase CT imaging of the brain was performed following IV bolus contrast injection. Subsequent parametric perfusion maps were calculated using RAPID software. CONTRAST:  OMNIPAQUE IOHEXOL 350 MG/ML SOLN COMPARISON:  None. FINDINGS: CT HEAD FINDINGS Brain: There is no acute intracranial hemorrhage or mass effect. Loss of gray-white differentiation involving a portion of the right lentiform nucleus. Ventricles and sulci are normal in size and configuration. No extra-axial collection. Vascular: No hyperdense vessel. Skull: Unremarkable. Sinuses/Orbits: Lobular maxillary sinus mucosal thickening. Orbits are unremarkable. Other: Minor patchy left mastoid tip opacification. ASPECTS [facility] Stroke Program Early CT Score) - Ganglionic level infarction (caudate, lentiform nuclei, internal capsule, insula, M1-M3 cortex): 6 - Supraganglionic infarction (M4-M6 cortex): 3 Total score (0-10 with 10 being normal): 9 CTA NECK FINDINGS Aortic arch: Normal caliber aorta.  Great vessel origins are patent. Right carotid system: Common carotid is patent. There is thrombus at the bifurcation extending into the external carotid. Less than 50% stenosis at the ICA origin. The cervical ICA is relatively smaller in caliber. Left carotid system:  Patent.  No stenosis. Vertebral arteries: Patent. Right vertebral artery slightly dominant. No stenosis. Skeleton: Unremarkable. Other neck: Thyroid not well evaluated due to streak artifact. Likely reactive cervical lymph nodes. Upper chest: Included lungs are clear. Endotracheal tube is present. Review of the MIP images confirms the above findings CTA HEAD FINDINGS Anterior circulation: Proximal intracranial internal carotid artery is patent. There is loss of enhancement at the level of the supraclinoid portion. Faint enhancement is seen along the right M1 MCA. There is reconstitution of M2 branches. Patent right A1 ACA. Anterior communicating artery is present. Left anterior and middle cerebral arteries are patent. Posterior circulation: Intracranial vertebral arteries, basilar artery, and posterior cerebral arteries are patent. Venous sinuses: As permitted by contrast timing, patent. Review of the MIP images confirms the above findings CT Brain Perfusion Findings: CBF (<30%) Volume: 31mL Perfusion (Tmax>6.0s) volume: Mismatch Volume: 14mL Infarction Location: Right MCA territory IMPRESSION: No acute intracranial hemorrhage. 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 Occlusion of the supraclinoid right ICA probably reflecting embolization from above  thrombus. Reconstitution of right MCA at the bifurcation. Right A1 ACA is patent. Anterior communicating artery is present. No posterior communicating artery is identified. Perfusion imaging demonstrates core infarction of 51 mL. This is greater than anticipated by noncontrast CT. Penumbra is 63 mL and therefore may be greater than calculated. Initial results were called by telephone at the time of interpretation on 07/04/2020 at 1:58 pm to provider Dr. Iver NestleBhagat, who verbally acknowledged these results.  Electronically Signed   By: Guadlupe SpanishPraneil  Patel M.D.   On: 07/04/2020 14:10   CT CEREBRAL PERFUSION W CONTRAST  Result Date: 07/04/2020 CLINICAL DATA:  Left-sided weakness and slurred speech EXAM: CT HEAD WITHOUT CONTRAST CT ANGIOGRAPHY HEAD AND NECK CT PERFUSION BRAIN TECHNIQUE: Multi detector CT imaging of the head was performed using the standard protocol without intravenous contrast. Multidetector CT imaging of the head and neck was performed using the standard protocol during bolus administration of intravenous contrast. Multiplanar CT image reconstructions and MIPs were obtained to evaluate the vascular anatomy. Carotid stenosis measurements (when applicable) are obtained utilizing NASCET criteria, using the distal internal carotid diameter as the denominator. Multiphase CT imaging of the brain was performed following IV bolus contrast injection. Subsequent parametric perfusion maps were calculated using RAPID software. CONTRAST:  100mL OMNIPAQUE IOHEXOL 350 MG/ML SOLN COMPARISON:  None. FINDINGS: CT HEAD FINDINGS Brain: There is no acute intracranial hemorrhage or mass effect. Loss of gray-white differentiation involving a portion of the right lentiform nucleus. Ventricles and sulci are normal in size and configuration. No extra-axial collection. Vascular: No hyperdense vessel. Skull: Unremarkable. Sinuses/Orbits: Lobular maxillary sinus mucosal thickening. Orbits are unremarkable. Other: Minor patchy left mastoid tip opacification. ASPECTS [facility] Stroke Program Early CT Score) - Ganglionic level infarction (caudate, lentiform nuclei, internal capsule, insula, M1-M3 cortex): 6 - Supraganglionic infarction (M4-M6 cortex): 3 Total score (0-10 with 10 being normal): 9 CTA NECK FINDINGS Aortic arch: Normal caliber aorta.  Great vessel origins are patent. Right carotid system: Common carotid is patent. There is thrombus at the bifurcation extending into the external carotid. Less than 50% stenosis at the ICA  origin. The cervical ICA is relatively smaller in caliber. Left carotid system: Patent.  No stenosis. Vertebral arteries: Patent. Right vertebral artery slightly dominant. No stenosis. Skeleton: Unremarkable. Other neck: Thyroid not well evaluated due to streak artifact. Likely reactive cervical lymph nodes. Upper chest: Included lungs are clear. Endotracheal tube is present. Review of the MIP images confirms the above findings CTA HEAD FINDINGS Anterior circulation: Proximal intracranial internal carotid artery is patent. There is loss of enhancement at the level of the supraclinoid portion. Faint enhancement is seen along the right M1 MCA. There is reconstitution of M2 branches. Patent right A1 ACA. Anterior communicating artery is present. Left anterior and middle cerebral arteries are patent. Posterior circulation: Intracranial vertebral arteries, basilar artery, and posterior cerebral arteries are patent. Venous sinuses: As permitted by contrast timing, patent. Review of the MIP images confirms the above findings CT Brain Perfusion Findings: CBF (<30%) Volume: 51mL Perfusion (Tmax>6.0s) volume: 114mL Mismatch Volume: 63mL Infarction Location: Right MCA territory IMPRESSION: No acute intracranial hemorrhage. 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 Reconstitution of right MCA at the bifurcation. Right A1 ACA is patent. Anterior communicating artery is present. No posterior communicating artery is identified. Perfusion imaging  demonstrates core infarction of 51 mL. This is greater than anticipated by noncontrast CT. Penumbra is 63 mL and therefore may be greater than calculated. Initial results were called by telephone at the time of interpretation on  07/04/2020 at 1:58 pm to provider Dr. Iver Nestle, who verbally acknowledged these results. Electronically Signed   By: Guadlupe Spanish M.D.   On: 07/04/2020 14:10   DG Abd Portable 1 View  Result Date: 07/04/2020 CLINICAL DATA:  OG tube placement. EXAM: PORTABLE ABDOMEN - 1 VIEW COMPARISON:  No prior. FINDINGS: OG tube noted with tip over the stomach. No gastric or bowel distention noted. Contrast noted both kidneys. Degenerative change lumbar spine. Surgical clips right upper quadrant. IMPRESSION: OG tube noted with tip over the stomach. No gastric or bowel distention noted. Electronically Signed   By: Maisie Fus  Register   On: 07/04/2020 15:13   CT HEAD CODE STROKE WO CONTRAST  Result Date: 07/04/2020 CLINICAL DATA:  Left-sided weakness and slurred speech EXAM: CT HEAD WITHOUT CONTRAST CT ANGIOGRAPHY HEAD AND NECK CT PERFUSION BRAIN TECHNIQUE: Multi detector CT imaging of the head was performed using the standard protocol without intravenous contrast. Multidetector CT imaging of the head and neck was performed using the standard protocol during bolus administration of intravenous contrast. Multiplanar CT image reconstructions and MIPs were obtained to evaluate the vascular anatomy. Carotid stenosis measurements (when applicable) are obtained utilizing NASCET criteria, using the distal internal carotid diameter as the denominator. Multiphase CT imaging of the brain was performed following IV bolus contrast injection. Subsequent parametric perfusion maps were calculated using RAPID software. CONTRAST:  OMNIPAQUE IOHEXOL 350 MG/ML SOLN COMPARISON:  None. FINDINGS: CT HEAD FINDINGS Brain: There is no acute intracranial hemorrhage or mass effect. Loss of gray-white differentiation involving a portion of the right lentiform nucleus. Ventricles and sulci are normal in size and configuration. No extra-axial collection. Vascular: No hyperdense vessel. Skull: Unremarkable. Sinuses/Orbits: Lobular maxillary sinus  mucosal thickening. Orbits are unremarkable. Other: Minor patchy left mastoid tip opacification. ASPECTS [facility] Stroke Program Early CT Score) - Ganglionic level infarction (caudate, lentiform nuclei, internal capsule, insula, M1-M3 cortex): 6 - Supraganglionic infarction (M4-M6 cortex): 3 Total score (0-10 with 10 being normal): 9 CTA NECK FINDINGS Aortic arch: Normal caliber aorta.  Great vessel origins are patent. Right carotid system: Common carotid is patent. There is thrombus at the bifurcation extending into the external carotid. Less than 50% stenosis at the ICA origin. The cervical ICA is relatively smaller in caliber. Left carotid system: Patent.  No stenosis. Vertebral arteries: Patent. Right vertebral artery slightly dominant. No stenosis. Skeleton: Unremarkable. Other neck: Thyroid not well evaluated due to streak artifact. Likely reactive cervical lymph nodes. Upper chest: Included lungs are clear. Endotracheal tube is present. Review of the MIP images confirms the above findings CTA HEAD FINDINGS Anterior circulation: Proximal intracranial internal carotid artery is patent. There is loss of enhancement at the level of the supraclinoid portion. Faint enhancement is seen along the right M1 MCA. There is reconstitution of M2 branches. Patent right A1 ACA. Anterior communicating artery is present. Left anterior and middle cerebral arteries are patent. Posterior circulation: Intracranial vertebral arteries, basilar artery, and posterior cerebral arteries are patent. Venous sinuses: As permitted by contrast timing, patent. Review of the MIP images confirms the above findings CT Brain Perfusion Findings: CBF (<30%) Volume: 51mL Perfusion (Tmax>6.0s) volume: Mismatch Volume: 63mL Infarction Location: Right MCA territory IMPRESSION: No acute intracranial hemorrhage. 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  Thrombus at the right common carotid bifurcation extending into  the external carotid with less than 50% narrowing of the ICA origin. 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 Reconstitution of right MCA at the bifurcation. Right A1 ACA is patent. Anterior communicating artery is present. No posterior communicating artery is identified. Perfusion imaging demonstrates core infarction of 51 mL. This is greater than anticipated by noncontrast CT. Penumbra is 63 mL and therefore may be greater than calculated. Initial results were called by telephone at the time of interpretation on 07/04/2020 at 1:58 pm to provider Dr. Iver Nestle, who verbally acknowledged these results. Electronically Signed   By: Guadlupe Spanish M.D.   On: 07/04/2020 14:10    Assessment: 35 y.o. female presenting in transfer from [facility] for thrombectomy after receiving tPA there for acute right MCA stroke secondary to right ICA occlusion.  1. Exam findings are consistent with a large region of right MCA territory hypoperfusion +/- core infarct.  2. CT head: No acute intracranial hemorrhage. 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  3. CTA head and neck: Thrombus at the right common carotid bifurcation extending into the external carotid with less than 50% narrowing of the ICA origin. 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 Reconstitution of right MCA at the bifurcation. Right A1 ACA is patent. Anterior communicating artery is present. No posterior communicating artery is identified.  4. CTP: Core infarction of 51 mL. This is greater than anticipated by noncontrast CT. Penumbra is 63 mL and therefore may be greater than calculated.  5. Stroke Risk Factors - HTN and uncontrolled DM 6. The patient has received IV tPA at [facility] after informed consent obtained from patient's family member 7. Family can no longer be contacted after patient arrival to [facility]. Two-physician emergent  decision made for lifesaving measure of mechanical thrombectomy given no family available and patient unable to provide informed consent. Benefits of thrombectomy are felt to significantly outweigh the risks. Consent form signed by Dr. Corliss Skains and Dr. Otelia Limes.    Recommendations: 1. Admitting to Neuro ICU.  2. Post-tPA and post-VIR order set to include frequent neuro checks and BP management.  3. No antiplatelet medications or anticoagulants for at least 24 hours following tPA.  4. DVT prophylaxis with SCDs.  5. Statin therapy. Drawing a baseline CK level.  6. Will need to start antiplatelet therapy if follow up CT at 24 hours is negative for hemorrhagic conversion. 7. Telemetry monitoring. 8. TTE.  9. MRI brain 10. PT/OT/Speech.  11. NPO until passes swallow evaluation.  12. Sliding scale insulin.  13. Telemetry monitoring 14. Fasting lipid panel, HgbA1c   45 minutes of critical care time spent in the emergent neurological evaluation and management of this critically ill patient. Time spent included coordination of care.   Electronically signed: Dr. Caryl Pina 07/04/2020, 4:27 PM

## 2020-07-04 NOTE — ED Triage Notes (Signed)
Patient arrives via [facility] for stroke like symptoms. Patient has left sided weakness/slurred speech. Patient unable to lay still in bed.

## 2020-07-04 NOTE — Sedation Documentation (Signed)
Right femoral sheath removed. 8 fr angioseal deployed

## 2020-07-04 NOTE — ED Notes (Signed)
40 mg of propofol given IV push by Vicente Males, MD in CT.

## 2020-07-04 NOTE — Anesthesia Postprocedure Evaluation (Signed)
Anesthesia Post Note  Patient: Brandi Myers  Procedure(s) Performed: IR WITH ANESTHESIA (N/A )     Patient location during evaluation: SICU Anesthesia Type: General Level of consciousness: sedated Pain management: pain level controlled Vital Signs Assessment: post-procedure vital signs reviewed and stable Respiratory status: patient remains intubated per anesthesia plan Cardiovascular status: stable Postop Assessment: no apparent nausea or vomiting Anesthetic complications: no   No complications documented.  Last Vitals:  Vitals:   07/04/20 2000 07/04/20 2020  BP: 120/88 120/88  Pulse: 91 89  Resp: 16 (!) 21  Temp:    SpO2: 100%     Last Pain:  Vitals:   07/04/20 1730  TempSrc: Axillary                 Rasheena Talmadge P Vander Kueker

## 2020-07-04 NOTE — ED Notes (Signed)
20 mg of propofol given IV push by Vicente Males, MD in CT.

## 2020-07-04 NOTE — ED Notes (Signed)
80 mg IV push of proprofol and 80 mg of rocuronium given by Vicente Males, MD for intubation.

## 2020-07-04 NOTE — ED Notes (Signed)
Unable to assess stroke swallow screen due to intubation.

## 2020-07-04 NOTE — Consult Note (Signed)
NAME:  Brandi Myers, MRN:  [id number], DOB:  06-13-1985, LOS: 0 ADMISSION DATE:  07/04/2020, CONSULTATION DATE:  07/04/2020 REFERRING MD:  Dr. Corliss Skains, CHIEF COMPLAINT:  Vent management    History of Present Illness:  Brandi Myers is a 35 y.o. with PMH significant for HTN and type 2 diabetes who presented as a code stroke due to left sided weakness and slurred speech.  On arrival patient was also seen altered/combative requiring intubation to obtain CT imaging to rule out stroke.  Head CT demonstrated right ICA occlusion as well as right common carotid bifurcation thrombus resulting in acute stroke.  Patient received TPA in ED at [facility] after which she was transferred to [facility] for further neurological work-up and interventions including thrombectomy.  On arrival to [facility] patient underwent bilateral common carotid arteriograms with revascularization of occluded terminal ICA and right MCA and right ACA.  Post intervention patient will remain intubated and was transferred to neuro ICU. Critical care consulted for ventilator management  Pertinent  Medical History   Uncontrolled diabetes and hypertension  Significant Hospital Events: Including procedures, antibiotic start and stop dates in addition to other pertinent events   .  3/23 > admitted as a code stroke and found to have occluded right ICA resulting in stroke.  Underwent IR revascularization and remained intubated post procedure  Interim History / Subjective:  As above  Objective   Height 5\' 5"  (1.651 m).    Vent Mode: AC FiO2 (%):  [35 %] 35 % Set Rate:  [22 bmp] 22 bmp Vt Set:  [450 mL] 450 mL PEEP:  [5 cmH20] 5 cmH20  No intake or output data in the 24 hours ending 07/04/20 1724 There were no vitals filed for this visit.  Examination: General: Adult female lying in bed in no acute distress HEENT: ETT, MM pink/moist, PERRL,  Neuro: Sedated and intubated CV: s1s2 regular rate and rhythm, no murmur, rubs, or  gallops,  PULM: Clear to auscultation bilaterally, no increased work of breathing, no added breath sounds GI: soft, bowel sounds active in all 4 quadrants, non-tender, non-distended Extremities: warm/dry, no edema  Skin: no rashes or lesions  Labs/imaging that I havepersonally reviewed    CT angio head 3/20 > 3 r acute infarct involving the right lentiform nucleus, thrombus of the right chronic carotid bifurcation  Lab work significant for > WBC 17.8, hemoglobin 9.5, CBG 412  Resolved Hospital Problem list     Assessment & Plan:  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 Head of bed elevated 30 degrees. Plateau pressures less than 30 cm H20.  Follow intermittent chest x-ray and ABG.   Ensure adequate pulmonary hygiene  Follow cultures  VAP bundle in place  PAD protocol  Uncontrolled type 2 diabetes -Most recent hemoglobin A1c 20 1911.1 P: 1 L bolus now for hypoglycemia Moderate SIS scale CBG checks every 4 hours Will need aggressive education for glucose control when appropriate CBG goal 1 40-1 80 History of hypertension Hold home Metformin Continue home Lantus  History of substance abuse Tobacco abuse P: Cessation education when appropriate UDS   Best practice (evaluated daily)  Diet:  NPO Pain/Anxiety/Delirium protocol (if indicated): Yes (RASS goal -1) VAP protocol (if indicated): Yes DVT prophylaxis: Subcutaneous  Heparin GI prophylaxis: N/A Glucose control:  SSI Yes Central venous access:  N/A Arterial line:  N/A Foley:  N/A Mobility:  bed rest  PT consulted: Yes Last date of multidisciplinary goals of care discussion Pending  Code Status:  full code Disposition: ICU  Labs   CBC: Recent Labs  Lab 07/04/20 1247  WBC 17.8*  NEUTROABS 12.7*  HGB 9.5*  HCT 35.0*  MCV 67.2*  PLT 235    Basic Metabolic Panel: Recent Labs  Lab 07/04/20 1337  NA 133*  K 2.9*  CL 99  CO2 21*  GLUCOSE 448*  BUN <5*  CREATININE 0.59  CALCIUM 8.3*   GFR: Estimated Creatinine Clearance: 102.6 mL/min (by C-G formula based on SCr of 0.59 mg/dL). Recent Labs  Lab 07/04/20 1247  WBC 17.8*    Liver Function Tests: Recent Labs  Lab 07/04/20 1337  AST 23  ALT 14  ALKPHOS 84  BILITOT 1.0  PROT 6.6  ALBUMIN 3.3*   No results for input(s): LIPASE, AMYLASE in the last 168 hours. No results for input(s): AMMONIA in the last 168 hours.  ABG    Component Value Date/Time   HCO3 23.5 07/04/2020 1307   ACIDBASEDEF 0.9 07/04/2020 1307   O2SAT 99.1 07/04/2020 1307     Coagulation Profile: Recent Labs  Lab 07/04/20 1337  INR 1.2    Cardiac Enzymes: No results for input(s): CKTOTAL, CKMB, CKMBINDEX, TROPONINI in the last 168 hours.  HbA1C: Hgb A1c MFr Bld  Date/Time Value Ref Range Status  01/04/2018 08:56 PM 11.1 (H) 4.8 - 5.6 % Final    Comment:    (NOTE) Pre diabetes:          5.7%-6.4% Diabetes:              >6.4% Glycemic control for   <7.0% adults with diabetes   02/26/2016 05:04 AM 9.6 (H) 4.8 - 5.6 % Final    Comment:    (NOTE)         Pre-diabetes: 5.7 - 6.4         Diabetes: >6.4         Glycemic control for adults with diabetes: <7.0     CBG: Recent Labs  Lab 07/04/20 1340 07/04/20 1546  GLUCAP 412* 316*    Review of Systems:   Unable to assess given intubation  Past Medical History:  She,  has a past medical history of Diabetes mellitus without  complication (HCC) and Hypertension.   Surgical History:   Past Surgical History:  Procedure Laterality Date  . CESAREAN SECTION    . CHOLECYSTECTOMY       Social History:   reports that she has been smoking cigarettes. She has been smoking about 0.10 packs per day. She has never used smokeless tobacco. She reports current alcohol use. She reports that she does not use drugs.   Family History:  Her family history includes Diabetes in her maternal grandmother.   Allergies Allergies  Allergen Reactions  . Norco [Hydrocodone-Acetaminophen] Itching, Nausea And Vomiting and Swelling     Home Medications  Prior to Admission medications   Medication Sig Start Date End Date Taking? Authorizing Provider  gabapentin (NEURONTIN) 300 MG capsule Take 600 mg by mouth 2 (two) times daily.  [provider]  insulin aspart (NOVOLOG) 100 UNIT/ML injection Inject 4 Units into the skin 3 (three) times daily with meals. 09/07/16   Phineas Semen, MD  insulin glargine (LANTUS) 100 UNIT/ML injection Inject 0.25 mLs (25 Units total) into the skin at bedtime. 09/07/16   Phineas Semen, MD  Lancets (FREESTYLE) lancets Use as instructed 09/07/16   Phineas Semen, MD  lidocaine (XYLOCAINE) 2 % solution Use as directed 10 mLs in the mouth or throat as needed. 08/26/18   Enid Derry, PA-C  metFORMIN (GLUCOPHAGE) 1000 MG tablet Take 1 tablet (1,000 mg total) by mouth 2 (two) times daily with a meal. 01/04/18 01/04/19  Merrily Brittle, MD  metFORMIN (GLUCOPHAGE) 1000 MG tablet Take 1 tablet (1,000 mg total) by mouth 2 (two) times daily with a meal. 11/02/19 12/02/19  Menshew, Charlesetta Ivory, PA-C     Critical care time:   CRITICAL CARE Performed by: Delfin Gant  Total critical care time: 40 minutes  Critical care time was exclusive of separately billable procedures and treating other patients.  Critical care was necessary to treat or prevent imminent or life-threatening  deterioration.  Critical care was time spent personally by me on the following activities: development of treatment plan with patient and/or surrogate as well as nursing, discussions with consultants, evaluation of patient's response to treatment, examination of patient, obtaining history from patient or surrogate, ordering and performing treatments and interventions, ordering and review of laboratory studies, ordering and review of radiographic studies, pulse oximetry and re-evaluation of patient's condition.  Delfin Gant, NP-C [facility] Personal contact information can be found on Amion  If no response please page: Adult pulmonary and critical care medicine pager on Amion unitl 7pm After 7pm please call [phone] 07/04/2020, 5:49 PM

## 2020-07-04 NOTE — Progress Notes (Signed)
CODE STROKE- PHARMACY COMMUNICATION   Time CODE STROKE called/page received:1313  Time response to CODE STROKE was made (in person): 1323  Time Stroke Kit retrieved from [place] (only if needed):1335  Name of Provider/Nurse contacted:Dr. Curly Shores  Past Medical History:  Diagnosis Date  . Diabetes mellitus without complication ([place])   . Hypertension    Prior to Admission medications   Medication Sig Start Date End Date Taking? Authorizing Provider  gabapentin (NEURONTIN) 300 MG capsule Take 600 mg by mouth 2 (two) times daily.     [provider]  insulin aspart (NOVOLOG) 100 UNIT/ML injection Inject 4 Units into the skin 3 (three) times daily with meals. 09/07/16   Nance Pear, MD  insulin glargine (LANTUS) 100 UNIT/ML injection Inject 0.25 mLs (25 Units total) into the skin at bedtime. 09/07/16   Nance Pear, MD  Lancets (FREESTYLE) lancets Use as instructed 09/07/16   Nance Pear, MD  lidocaine (XYLOCAINE) 2 % solution Use as directed 10 mLs in the mouth or throat as needed. 08/26/18   Laban Emperor, PA-C  metFORMIN (GLUCOPHAGE) 1000 MG tablet Take 1 tablet (1,000 mg total) by mouth 2 (two) times daily with a meal. 01/04/18 01/04/19  Darel Hong, MD  metFORMIN (GLUCOPHAGE) 1000 MG tablet Take 1 tablet (1,000 mg total) by mouth 2 (two) times daily with a meal. 11/02/19 12/02/19  Menshew, Dannielle Karvonen, PA-C    Lorna Dibble ,PharmD Clinical Pharmacist  07/04/2020  4:10 PM

## 2020-07-04 NOTE — ED Notes (Signed)
Back from CT- unable to get all scans due to patient agitation.

## 2020-07-04 NOTE — ED Notes (Signed)
Patient to CT with RN and Vicente Males, MD.

## 2020-07-04 NOTE — Procedures (Signed)
S/P bilateral common carotid arteriogram and RT Vert arteriogram  followed by revascularization occluded terminal ICA and Rt MCA and RT ACA A1 with x 1 pass with 46mm x 37 mm embotrap retriever and aspiration achieving achieving a TICI 2B revascularization. Multiple distal occlusions of sup and inf divisions in the M4 regions noted. Good  Retrograde collateral reconstitution from RT P3 collaterals  Of post MCA distribution. Post CT contrast stain=/_ petechial hemorrhage in RT post putamen. 32F angioseal for hemostasis at RT groin puncture site. Distal pulses present bilaterally.  Patient left intubated. S.Deveshwat MD

## 2020-07-04 NOTE — ED Notes (Signed)
Called and initiated code stroke with [facility] 1312

## 2020-07-04 NOTE — ED Provider Notes (Signed)
[facility] Emergency Department Provider Note   ____________________________________________   Event Date/Time   First MD Initiated Contact with Patient 07/04/20 1244     (approximate)  I have reviewed the triage vital signs and the nursing notes.   HISTORY  Chief Complaint Code Stroke    HPI Brandi Myers is a 35 y.o. female with a past medical history of type 2 diabetes, hypertension, and intellectual/developmental delay who presents via EMS for altered mental status and left-sided hemiplegia.  Further history and review of systems are unable to be obtained at this time given patient's mental status         Past Medical History:  Diagnosis Date  . Diabetes mellitus without complication (HCC)   . Hypertension     Patient Active Problem List   Diagnosis Date Noted  . Cellulitis 02/26/2016    Past Surgical History:  Procedure Laterality Date  . CESAREAN SECTION    . CHOLECYSTECTOMY      Prior to Admission medications   Medication Sig Start Date End Date Taking? Authorizing Provider  gabapentin (NEURONTIN) 300 MG capsule Take 600 mg by mouth 2 (two) times daily.     [provider]  insulin aspart (NOVOLOG) 100 UNIT/ML injection Inject 4 Units into the skin 3 (three) times daily with meals. 09/07/16   Phineas Semen, MD  insulin glargine (LANTUS) 100 UNIT/ML injection Inject 0.25 mLs (25 Units total) into the skin at bedtime. 09/07/16   Phineas Semen, MD  Lancets (FREESTYLE) lancets Use as instructed 09/07/16   Phineas Semen, MD  lidocaine (XYLOCAINE) 2 % solution Use as directed 10 mLs in the mouth or throat as needed. 08/26/18   Enid Derry, PA-C  metFORMIN (GLUCOPHAGE) 1000 MG tablet Take 1 tablet (1,000 mg total) by mouth 2 (two) times daily with a meal. 01/04/18 01/04/19  Merrily Brittle, MD  metFORMIN (GLUCOPHAGE) 1000 MG tablet Take 1 tablet (1,000 mg total) by mouth 2 (two) times daily with a meal. 11/02/19 12/02/19   Menshew, Charlesetta Ivory, PA-C    Allergies Norco [hydrocodone-acetaminophen]  Family History  Problem Relation Age of Onset  . Diabetes Maternal Grandmother     Social History Social History   Tobacco Use  . Smoking status: Current Every Day Smoker    Packs/day: 0.10    Types: Cigarettes  . Smokeless tobacco: Never Used  Substance Use Topics  . Alcohol use: Yes    Comment: occ.  . Drug use: No    Review of Systems Unable to assess ____________________________________________   PHYSICAL EXAM:  VITAL SIGNS: ED Triage Vitals  Enc Vitals Group     BP 07/04/20 1330 (!) 121/109     Pulse Rate 07/04/20 1330 (!) 110     Resp 07/04/20 1330 (!) 30     Temp 07/04/20 1432 98 F (36.7 C)     Temp Source 07/04/20 1432 Axillary     SpO2 07/04/20 1330 100 %     Weight 07/04/20 1400 176 lb 5.9 oz (80 kg)     Height --      Head Circumference --      Peak Flow --      Pain Score 07/04/20 1246 0     Pain Loc --      Pain Edu? --      Excl. in GC? --    Constitutional: Alert and screaming slurred speech.  Writhing around on stretcher Eyes: Conjunctivae are injected. PERRL. Head: Atraumatic. Nose: No  congestion/rhinnorhea. Mouth/Throat: Mucous membranes are moist. Neck: No stridor Cardiovascular: Grossly normal heart sounds.  Good peripheral circulation. Respiratory: Normal respiratory effort.  No retractions. Gastrointestinal: Soft and nontender. No distention. Musculoskeletal: No obvious deformities Neurologic: Slurred speech.  Left upper and lower extremity hemiplegia.  Left sided attention deficit Skin:  Skin is warm and dry. No rash noted. Psychiatric: Mood and affect are normal. Speech and behavior are normal.  ____________________________________________   LABS (all labs ordered are listed, but only abnormal results are displayed)  Labs Reviewed  CBC - Abnormal; Notable for the following components:      Result Value   WBC 17.8 (*)    RBC 5.21 (*)     Hemoglobin 9.5 (*)    HCT 35.0 (*)    MCV 67.2 (*)    MCH 18.2 (*)    MCHC 27.1 (*)    RDW 21.7 (*)    All other components within normal limits  DIFFERENTIAL - Abnormal; Notable for the following components:   Neutro Abs 12.7 (*)    Monocytes Absolute 1.4 (*)    Abs Immature Granulocytes 0.14 (*)    All other components within normal limits  BLOOD GAS, VENOUS - Abnormal; Notable for the following components:   pCO2, Ven 37 (*)    pO2, Ven 138.0 (*)    All other components within normal limits  COMPREHENSIVE METABOLIC PANEL - Abnormal; Notable for the following components:   Sodium 133 (*)    Potassium 2.9 (*)    CO2 21 (*)    Glucose, Bld 448 (*)    BUN <5 (*)    Calcium 8.3 (*)    Albumin 3.3 (*)    All other components within normal limits  CBG MONITORING, ED - Abnormal; Notable for the following components:   Glucose-Capillary 412 (*)    All other components within normal limits  RESP PANEL BY RT-PCR (FLU A&B, COVID) ARPGX2  ETHANOL  PROTIME-INR  APTT  HCG, QUANTITATIVE, PREGNANCY  URINE DRUG SCREEN, QUALITATIVE ([facility] ONLY)  URINALYSIS, ROUTINE W REFLEX MICROSCOPIC  PREGNANCY, URINE  POC URINE PREG, ED   ____________________________________________  EKG  ED ECG REPORT I, Merwyn Katos, the attending physician, personally viewed and interpreted this ECG.  Date: 07/04/2020 EKG Time: 1416 Rate: 103 Rhythm: normal sinus rhythm QRS Axis: normal Intervals: normal ST/T Wave abnormalities: normal Narrative Interpretation: no evidence of acute ischemia  ____________________________________________  RADIOLOGY  ED MD interpretation: CT noncontrast of the head shows no evidence of acute intracranial hemorrhage  CT angio of the head and neck/CT perfusions shows an acute left ICA occlusion  One-view portable x-ray of the chest and abdomen shows ET tube in adequate positioning as well as OG tube  Official radiology report(s): CT Angio Head W/Cm &/Or Wo  Cm  Result Date: 07/04/2020 CLINICAL DATA:  Left-sided weakness and slurred speech EXAM: CT HEAD WITHOUT CONTRAST CT ANGIOGRAPHY HEAD AND NECK CT PERFUSION BRAIN TECHNIQUE: Multi detector CT imaging of the head was performed using the standard protocol without intravenous contrast. Multidetector CT imaging of the head and neck was performed using the standard protocol during bolus administration of intravenous contrast. Multiplanar CT image reconstructions and MIPs were obtained to evaluate the vascular anatomy. Carotid stenosis measurements (when applicable) are obtained utilizing NASCET criteria, using the distal internal carotid diameter as the denominator. Multiphase CT imaging of the brain was performed following IV bolus contrast injection. Subsequent parametric perfusion maps were calculated using RAPID software. CONTRAST:  OMNIPAQUE IOHEXOL  350 MG/ML SOLN COMPARISON:  None. FINDINGS: CT HEAD FINDINGS Brain: There is no acute intracranial hemorrhage or mass effect. Loss of gray-white differentiation involving a portion of the right lentiform nucleus. Ventricles and sulci are normal in size and configuration. No extra-axial collection. Vascular: No hyperdense vessel. Skull: Unremarkable. Sinuses/Orbits: Lobular maxillary sinus mucosal thickening. Orbits are unremarkable. Other: Minor patchy left mastoid tip opacification. ASPECTS [facility]([place] Stroke Program Early CT Score) - Ganglionic level infarction (caudate, lentiform nuclei, internal capsule, insula, M1-M3 cortex): 6 - Supraganglionic infarction (M4-M6 cortex): 3 Total score (0-10 with 10 being normal): 9 CTA NECK FINDINGS Aortic arch: Normal caliber aorta.  Great vessel origins are patent. Right carotid system: Common carotid is patent. There is thrombus at the bifurcation extending into the external carotid. Less than 50% stenosis at the ICA origin. The cervical ICA is relatively smaller in caliber. Left carotid system: Patent.  No stenosis.  Vertebral arteries: Patent. Right vertebral artery slightly dominant. No stenosis. Skeleton: Unremarkable. Other neck: Thyroid not well evaluated due to streak artifact. Likely reactive cervical lymph nodes. Upper chest: Included lungs are clear. Endotracheal tube is present. Review of the MIP images confirms the above findings CTA HEAD FINDINGS Anterior circulation: Proximal intracranial internal carotid artery is patent. There is loss of enhancement at the level of the supraclinoid portion. Faint enhancement is seen along the right M1 MCA. There is reconstitution of M2 branches. Patent right A1 ACA. Anterior communicating artery is present. Left anterior and middle cerebral arteries are patent. Posterior circulation: Intracranial vertebral arteries, basilar artery, and posterior cerebral arteries are patent. Venous sinuses: As permitted by contrast timing, patent. Review of the MIP images confirms the above findings CT Brain Perfusion Findings: CBF (<30%) Volume: 51mL Perfusion (Tmax>6.0s) volume: 114mL Mismatch Volume: 63mL Infarction Location: Right MCA territory IMPRESSION: No acute intracranial hemorrhage. 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 Reconstitution of right MCA at the bifurcation. Right A1 ACA is patent. Anterior communicating artery is present. No posterior communicating artery is identified. Perfusion imaging demonstrates core infarction of 51 mL. This is greater than anticipated by noncontrast CT. Penumbra is 63 mL and therefore may be greater than calculated. Initial results were called by telephone at the time of interpretation on 07/04/2020 at 1:58 pm to provider Dr. Iver NestleBhagat, who verbally acknowledged these results. Electronically Signed    By: Guadlupe SpanishPraneil  Patel M.D.   On: 07/04/2020 14:10   DG Chest 1 View  Result Date: 07/04/2020 CLINICAL DATA:  Intubation EXAM: CHEST  1 VIEW COMPARISON:  01/12/2016 FINDINGS: Endotracheal tube is 3.6 cm above the carina. NG tube is in the stomach. No confluent opacities or effusions. Heart is normal size. No acute bony abnormality. IMPRESSION: Support devices in expected position. No acute cardiopulmonary disease. Electronically Signed   By: Charlett NoseKevin  Dover M.D.   On: 07/04/2020 15:09   CT Angio Neck W and/or Wo Contrast  Result Date: 07/04/2020 CLINICAL DATA:  Left-sided weakness and slurred speech EXAM: CT HEAD WITHOUT CONTRAST CT ANGIOGRAPHY HEAD AND NECK CT PERFUSION BRAIN TECHNIQUE: Multi detector CT imaging of the head was performed using the standard protocol without intravenous contrast. Multidetector CT imaging of the head and neck was performed using the standard protocol during bolus administration of intravenous contrast. Multiplanar CT image reconstructions and MIPs were obtained to  evaluate the vascular anatomy. Carotid stenosis measurements (when applicable) are obtained utilizing NASCET criteria, using the distal internal carotid diameter as the denominator. Multiphase CT imaging of the brain was performed following IV bolus contrast injection. Subsequent parametric perfusion maps were calculated using RAPID software. CONTRAST:  OMNIPAQUE IOHEXOL 350 MG/ML SOLN COMPARISON:  None. FINDINGS: CT HEAD FINDINGS Brain: There is no acute intracranial hemorrhage or mass effect. Loss of gray-white differentiation involving a portion of the right lentiform nucleus. Ventricles and sulci are normal in size and configuration. No extra-axial collection. Vascular: No hyperdense vessel. Skull: Unremarkable. Sinuses/Orbits: Lobular maxillary sinus mucosal thickening. Orbits are unremarkable. Other: Minor patchy left mastoid tip opacification. ASPECTS [facility] [place] Stroke Program Early CT Score) - Ganglionic level  infarction (caudate, lentiform nuclei, internal capsule, insula, M1-M3 cortex): 6 - Supraganglionic infarction (M4-M6 cortex): 3 Total score (0-10 with 10 being normal): 9 CTA NECK FINDINGS Aortic arch: Normal caliber aorta.  Great vessel origins are patent. Right carotid system: Common carotid is patent. There is thrombus at the bifurcation extending into the external carotid. Less than 50% stenosis at the ICA origin. The cervical ICA is relatively smaller in caliber. Left carotid system: Patent.  No stenosis. Vertebral arteries: Patent. Right vertebral artery slightly dominant. No stenosis. Skeleton: Unremarkable. Other neck: Thyroid not well evaluated due to streak artifact. Likely reactive cervical lymph nodes. Upper chest: Included lungs are clear. Endotracheal tube is present. Review of the MIP images confirms the above findings CTA HEAD FINDINGS Anterior circulation: Proximal intracranial internal carotid artery is patent. There is loss of enhancement at the level of the supraclinoid portion. Faint enhancement is seen along the right M1 MCA. There is reconstitution of M2 branches. Patent right A1 ACA. Anterior communicating artery is present. Left anterior and middle cerebral arteries are patent. Posterior circulation: Intracranial vertebral arteries, basilar artery, and posterior cerebral arteries are patent. Venous sinuses: As permitted by contrast timing, patent. Review of the MIP images confirms the above findings CT Brain Perfusion Findings: CBF (<30%) Volume: 51mL Perfusion (Tmax>6.0s) volume: Mismatch Volume: 63mL Infarction Location: Right MCA territory IMPRESSION: No acute intracranial hemorrhage. 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  Reconstitution of right MCA at the bifurcation. Right A1 ACA is patent. Anterior communicating artery is present. No posterior communicating artery is identified. Perfusion imaging demonstrates core infarction of 51 mL. This is greater than anticipated by noncontrast CT. Penumbra is 63 mL and therefore may be greater than calculated. Initial results were called by telephone at the time of interpretation on 07/04/2020 at 1:58 pm to provider Dr. Iver Nestle, who verbally acknowledged these results. Electronically Signed   By: Guadlupe Spanish M.D.   On: 07/04/2020 14:10   CT CEREBRAL PERFUSION W CONTRAST  Result Date: 07/04/2020 CLINICAL DATA:  Left-sided weakness and slurred speech EXAM: CT HEAD WITHOUT CONTRAST CT ANGIOGRAPHY HEAD AND NECK CT PERFUSION BRAIN TECHNIQUE: Multi detector CT imaging of the head was performed using the standard protocol without intravenous contrast. Multidetector CT imaging of the head and neck was performed using the standard protocol during bolus administration of intravenous contrast. Multiplanar CT image reconstructions and MIPs were obtained to evaluate the vascular anatomy. Carotid stenosis measurements (when applicable) are obtained utilizing NASCET criteria, using the distal internal carotid diameter as the denominator. Multiphase CT imaging  of the brain was performed following IV bolus contrast injection. Subsequent parametric perfusion maps were calculated using RAPID software. CONTRAST:  OMNIPAQUE IOHEXOL 350 MG/ML SOLN COMPARISON:  None. FINDINGS: CT HEAD FINDINGS Brain: There is no acute intracranial hemorrhage or mass effect. Loss of gray-white differentiation involving a portion of the right lentiform nucleus. Ventricles and sulci are normal in size and configuration. No extra-axial collection. Vascular: No hyperdense vessel. Skull: Unremarkable. Sinuses/Orbits: Lobular maxillary sinus mucosal thickening. Orbits are unremarkable. Other: Minor patchy left mastoid tip  opacification. ASPECTS [facility] Stroke Program Early CT Score) - Ganglionic level infarction (caudate, lentiform nuclei, internal capsule, insula, M1-M3 cortex): 6 - Supraganglionic infarction (M4-M6 cortex): 3 Total score (0-10 with 10 being normal): 9 CTA NECK FINDINGS Aortic arch: Normal caliber aorta.  Great vessel origins are patent. Right carotid system: Common carotid is patent. There is thrombus at the bifurcation extending into the external carotid. Less than 50% stenosis at the ICA origin. The cervical ICA is relatively smaller in caliber. Left carotid system: Patent.  No stenosis. Vertebral arteries: Patent. Right vertebral artery slightly dominant. No stenosis. Skeleton: Unremarkable. Other neck: Thyroid not well evaluated due to streak artifact. Likely reactive cervical lymph nodes. Upper chest: Included lungs are clear. Endotracheal tube is present. Review of the MIP images confirms the above findings CTA HEAD FINDINGS Anterior circulation: Proximal intracranial internal carotid artery is patent. There is loss of enhancement at the level of the supraclinoid portion. Faint enhancement is seen along the right M1 MCA. There is reconstitution of M2 branches. Patent right A1 ACA. Anterior communicating artery is present. Left anterior and middle cerebral arteries are patent. Posterior circulation: Intracranial vertebral arteries, basilar artery, and posterior cerebral arteries are patent. Venous sinuses: As permitted by contrast timing, patent. Review of the MIP images confirms the above findings CT Brain Perfusion Findings: CBF (<30%) Volume: 51mL Perfusion (Tmax>6.0s) volume: Mismatch Volume: 63mL Infarction Location: Right MCA territory IMPRESSION: No acute intracranial hemorrhage. 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 Occlusion of  the supraclinoid right ICA probably reflecting embolization from above thrombus. Reconstitution of right MCA at the bifurcation. Right A1 ACA is patent. Anterior communicating artery is present. No posterior communicating artery is identified. Perfusion imaging demonstrates core infarction of 51 mL. This is greater than anticipated by noncontrast CT. Penumbra is 63 mL and therefore may be greater than calculated. Initial results were called by telephone at the time of interpretation on 07/04/2020 at 1:58 pm to provider Dr. Iver Nestle, who verbally acknowledged these results. Electronically Signed   By: Guadlupe Spanish M.D.   On: 07/04/2020 14:10   DG Abd Portable 1 View  Result Date: 07/04/2020 CLINICAL DATA:  OG tube placement. EXAM: PORTABLE ABDOMEN - 1 VIEW COMPARISON:  No prior. FINDINGS: OG tube noted with tip over the stomach. No gastric or bowel distention noted. Contrast noted both kidneys. Degenerative change lumbar spine. Surgical clips right upper quadrant. IMPRESSION: OG tube noted with tip over the stomach. No gastric or bowel distention noted. Electronically Signed   By: Maisie Fus  Register   On: 07/04/2020 15:13   CT HEAD CODE STROKE WO CONTRAST  Result Date: 07/04/2020 CLINICAL DATA:  Left-sided weakness and slurred speech EXAM: CT HEAD WITHOUT CONTRAST CT ANGIOGRAPHY HEAD AND NECK CT PERFUSION BRAIN TECHNIQUE: Multi detector CT imaging of the head was performed using the standard protocol  without intravenous contrast. Multidetector CT imaging of the head and neck was performed using the standard protocol during bolus administration of intravenous contrast. Multiplanar CT image reconstructions and MIPs were obtained to evaluate the vascular anatomy. Carotid stenosis measurements (when applicable) are obtained utilizing NASCET criteria, using the distal internal carotid diameter as the denominator. Multiphase CT imaging of the brain was performed following IV bolus contrast injection. Subsequent  parametric perfusion maps were calculated using RAPID software. CONTRAST:  OMNIPAQUE IOHEXOL 350 MG/ML SOLN COMPARISON:  None. FINDINGS: CT HEAD FINDINGS Brain: There is no acute intracranial hemorrhage or mass effect. Loss of gray-white differentiation involving a portion of the right lentiform nucleus. Ventricles and sulci are normal in size and configuration. No extra-axial collection. Vascular: No hyperdense vessel. Skull: Unremarkable. Sinuses/Orbits: Lobular maxillary sinus mucosal thickening. Orbits are unremarkable. Other: Minor patchy left mastoid tip opacification. ASPECTS [facility] Stroke Program Early CT Score) - Ganglionic level infarction (caudate, lentiform nuclei, internal capsule, insula, M1-M3 cortex): 6 - Supraganglionic infarction (M4-M6 cortex): 3 Total score (0-10 with 10 being normal): 9 CTA NECK FINDINGS Aortic arch: Normal caliber aorta.  Great vessel origins are patent. Right carotid system: Common carotid is patent. There is thrombus at the bifurcation extending into the external carotid. Less than 50% stenosis at the ICA origin. The cervical ICA is relatively smaller in caliber. Left carotid system: Patent.  No stenosis. Vertebral arteries: Patent. Right vertebral artery slightly dominant. No stenosis. Skeleton: Unremarkable. Other neck: Thyroid not well evaluated due to streak artifact. Likely reactive cervical lymph nodes. Upper chest: Included lungs are clear. Endotracheal tube is present. Review of the MIP images confirms the above findings CTA HEAD FINDINGS Anterior circulation: Proximal intracranial internal carotid artery is patent. There is loss of enhancement at the level of the supraclinoid portion. Faint enhancement is seen along the right M1 MCA. There is reconstitution of M2 branches. Patent right A1 ACA. Anterior communicating artery is present. Left anterior and middle cerebral arteries are patent. Posterior circulation: Intracranial vertebral arteries, basilar artery,  and posterior cerebral arteries are patent. Venous sinuses: As permitted by contrast timing, patent. Review of the MIP images confirms the above findings CT Brain Perfusion Findings: CBF (<30%) Volume: 77mL Perfusion (Tmax>6.0s) volume: Mismatch Volume: 101mL Infarction Location: Right MCA territory IMPRESSION: No acute intracranial hemorrhage. 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 Reconstitution of right MCA at the bifurcation. Right A1 ACA is patent. Anterior communicating artery is present. No posterior communicating artery is identified. Perfusion imaging demonstrates core infarction of 51 mL. This is greater than anticipated by noncontrast CT. Penumbra is 63 mL and therefore may be greater than calculated. Initial results were called by telephone at the time of interpretation on 07/04/2020 at 1:58 pm to provider Dr. Iver Nestle, who verbally acknowledged these results. Electronically Signed   By: Guadlupe Spanish M.D.   On: 07/04/2020 14:10    ____________________________________________   PROCEDURES  Procedure(s) performed (including Critical Care):  Date/Time: 07/04/2020 4:13 PM Performed by: Merwyn Katos, MD    Procedure Name: Intubation Date/Time: 07/04/2020 4:13 PM Performed by: Merwyn Katos, MD Pre-anesthesia Checklist: Patient identified, Patient being monitored, Emergency Drugs available, Timeout performed and Suction available Oxygen Delivery Method: Non-rebreather mask Preoxygenation: Pre-oxygenation with 100% oxygen Induction Type: Rapid sequence Ventilation: Mask ventilation  without difficulty Laryngoscope Size: Glidescope Grade View: Grade I Tube size: 7.5 mm Number of attempts: 1 Airway Equipment and Method:  Video-laryngoscopy Placement Confirmation: ETT inserted through vocal cords under direct vision,  CO2 detector and Breath sounds checked- equal and bilateral Secured at: 24 cm Tube secured with: ETT holder Dental Injury: Teeth and Oropharynx as per pre-operative assessment     .Critical Care Performed by: Merwyn Katos, MD Authorized by: Merwyn Katos, MD   Critical care provider statement:    Critical care time (minutes):  61   Critical care time was exclusive of:  Separately billable procedures and treating other patients   Critical care was necessary to treat or prevent imminent or life-threatening deterioration of the following conditions:  CNS failure or compromise   Critical care was time spent personally by me on the following activities:  Discussions with consultants, evaluation of patient's response to treatment, examination of patient, ordering and performing treatments and interventions, ordering and review of laboratory studies, ordering and review of radiographic studies, pulse oximetry, re-evaluation of patient's condition, obtaining history from patient or surrogate and review of old charts   I assumed direction of critical care for this patient from another provider in my specialty: no     Care discussed with: admitting provider   .1-3 Lead EKG Interpretation Performed by: Merwyn Katos, MD Authorized by: Merwyn Katos, MD     Interpretation: abnormal     ECG rate:  109   ECG rate assessment: tachycardic     Rhythm: sinus tachycardia     Ectopy: none     Conduction: normal       ____________________________________________   INITIAL IMPRESSION / ASSESSMENT AND PLAN / ED COURSE  As part of my medical decision making, I reviewed the following data within the electronic MEDICAL RECORD NUMBER Nursing notes reviewed and incorporated, Labs reviewed, EKG interpreted, Old chart reviewed, Radiograph reviewed and Notes from prior ED visits reviewed and incorporated      PMH risk factors: DMII Neurologic Deficits: left hemiplegia, inattention Last known Well Time: 0530 NIH Stroke Score: 19 Given History and Exam I have lower suspicion for infectious etiology, neurologic changes secondary to toxicologic ingestion,seizure, complex migraine. Presentation concerning for possible stroke requiring workup. Workup: Labs: POC glucose, CBC, BMP, LFTs, Troponin, PT/INR, PTT, Type and Screen Other Diagnostics: ECG, CXR, non-contrast head CT followed by CTA brain and neck (to r/o large vessel occlusion amenable to thrombectomy) Interventions: tPA given, pending interventional thrombectomy Consult: Neurology. Discussed with Dr.  regarding patient's neurological symptoms and last well-known time approximately 0530 and eligibility for TPA criteria. Disposition: Admission to Neurology ICU.          ____________________________________________   FINAL CLINICAL IMPRESSION(S) / ED DIAGNOSES  Final diagnoses:  Acute ischemic stroke (HCC)  Slurred speech  Flaccid hemiplegia, unspecified etiology, unspecified laterality [facility])     ED Discharge Orders    None       Note:  This document was prepared using Dragon voice recognition software and may include unintentional dictation errors.   Merwyn Katos, MD 07/04/20 [phone]

## 2020-07-04 NOTE — ED Notes (Signed)
Patient unable to sign for transfer 

## 2020-07-04 NOTE — Transfer of Care (Signed)
Immediate Anesthesia Transfer of Care Note  Patient: Brandi Myers  Procedure(s) Performed: IR WITH ANESTHESIA (N/A )  Patient Location: NICU  Anesthesia Type:General  Level of Consciousness: Patient remains intubated per anesthesia plan  Airway & Oxygen Therapy: Patient placed on Ventilator (see vital sign flow sheet for setting)  Post-op Assessment: Report given to RN and Post -op Vital signs reviewed and stable  Post vital signs: Reviewed and stable  Last Vitals:  Vitals Value Taken Time  BP 154/109 07/04/20 1722  Temp    Pulse 105 07/04/20 1723  Resp 16 07/04/20 1723  SpO2 100 % 07/04/20 1723  Vitals shown include unvalidated device data.  Last Pain: There were no vitals filed for this visit.       Complications: No complications documented.

## 2020-07-04 NOTE — Anesthesia Preprocedure Evaluation (Addendum)
Anesthesia Evaluation  Patient identified by MRN, date of birth, ID band Patient unresponsive    Reviewed: Allergy & Precautions, NPO status , Patient's Chart, lab work & pertinent test results  Airway Mallampati: Intubated       Dental no notable dental hx.    Pulmonary neg pulmonary ROS, Current Smoker,    Pulmonary exam normal        Cardiovascular hypertension, Normal cardiovascular exam Rhythm:Regular Rate:Normal     Neuro/Psych negative neurological ROS  negative psych ROS   GI/Hepatic negative GI ROS, Neg liver ROS,   Endo/Other  diabetes, Well Controlled, Type 2, Insulin Dependent  Renal/GU negative Renal ROS  negative genitourinary   Musculoskeletal negative musculoskeletal ROS (+)   Abdominal Normal abdominal exam  (+)   Peds  Hematology  (+) Blood dyscrasia, anemia ,   Anesthesia Other Findings   Reproductive/Obstetrics                            Anesthesia Physical Anesthesia Plan  ASA: II  Anesthesia Plan: General   Post-op Pain Management:    Induction: Intravenous  PONV Risk Score and Plan: 2 and Ondansetron and Midazolam  Airway Management Planned: Oral ETT  Additional Equipment: None  Intra-op Plan:   Post-operative Plan: Post-operative intubation/ventilation  Informed Consent: I have reviewed the patients History and Physical, chart, labs and discussed the procedure including the risks, benefits and alternatives for the proposed anesthesia with the patient or authorized representative who has indicated his/her understanding and acceptance.     Dental advisory given  Plan Discussed with: CRNA  Anesthesia Plan Comments:        Anesthesia Quick Evaluation

## 2020-07-04 NOTE — Sedation Documentation (Signed)
Pt transported to 4N rm 22 via bed accompanied by RNs and CRNA. 4N RN at bedside to receive pt. Hand off completed. Right groin level 0 and bilateral DP and PT pulses palpable.

## 2020-07-04 NOTE — ED Notes (Signed)
Patient back in room from CT scan.

## 2020-07-04 NOTE — ED Notes (Signed)
Patient back to CT with RN and RT.

## 2020-07-04 NOTE — Consult Note (Signed)
Neurology Consultation Reason for Consult: Code stroke Requesting Physician: Donna Bernard  CC: Left-sided weakness and slurred speech  History is obtained from: Chart review and emergency contact   HPI: Brandi Myers is a 35 y.o. female with a past medical history significant for uncontrolled diabetes, hypertension.  Per family there is a complicated social situation at this time.  She lives intermittently in an apartment by herself but was kicked out and they went to pick her up at 5:30 AM.  At that point she seemed "off" and generally quiet but did not seem to have any difficulty with left-sided weakness.  Family attributed this to psychosocial issues and potential substance use noting that her sister passed away from an overdose in October.  Later in the morning she complained of diffuse body pain and was administered gabapentin and a muscle relaxer.  At 11:30 AM she stood up and walked to the shower and at 1215 she was found on the bottom of the tub with slurred speech and difficulty moving the left side.  She was not preactivated by EMS, but on arrival in the ED clearly appear to have a doorway examination consistent with a right MCA territory infarct.  Dry head CT was obtained which was notable for aspects of 9 due to right lentiform nucleus haziness.  Initial CTA could not be obtained due to patient movement and she had to be intubated to obtain a diagnostic scan which was a significant delay.  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  CT perfusion imaging demonstrated possible core infarct of 51 mL (though notably this was obtained within 2 hours of symptom onset and therefore may reflect an overestimate of the true core).  Mismatch ratio was 2 with a penumbra of 63 mL.  Case was discussed with Dr. Corliss Skains of neuro interventional radiology and Dr. Otelia Limes of [facility]  neurology and the patient was accepted for transfer to [facility] for emergent intervention.  She was also started on TPA after consenting the patient's emergency contact with full discussion of risks of bleeding and importance of accurately determining the patient's last known well.  Ms. Marikay Alar confirm the last known well as described above and agreed that the patient would want TPA given the risk/benefit.  She confirmed that the patient had no recent procedures or other contraindications to TPA  Regarding her baseline, she is functional at baseline and family believes she is smoking at least 1 pack a day currently.  She is on Metformin for her diabetes, does not take oral contraceptive pills, has had no recent surgeries or procedures, has not had any complaints of bleeding and family believes that she would have informed them of any significant bleeding.  On chart review she has noted to have a significant decline in her hemoglobin from baseline 2 years ago.  However even at that time she had microcytic anemia and this was felt to likely be a chronic process given no evidence of bleeding on examination.  She did begin to have some slight vaginal bleeding after TPA administration.  LKW: 11:30 AM tPA given?:  Yes, significant delay due to awaiting PT/INR to confirm these were within goal given unknown substance use and potential for K2 exposure;  IA performed?:  Transferred to [facility] for this procedure Premorbid modified rankin scale:     0 - No symptoms.  ROS:  Unable to obtain due to altered mental status.   Past Medical History:  Diagnosis Date  . Diabetes mellitus without complication (HCC)   . Hypertension    Past Surgical History:  Procedure Laterality Date  . CESAREAN SECTION    . CHOLECYSTECTOMY     Current Outpatient Medications  Medication Instructions  . gabapentin (NEURONTIN) 600 mg, Oral, 2 times daily  . insulin aspart (NOVOLOG) 4 Units, Subcutaneous, 3 times daily  with meals  . insulin glargine (LANTUS) 25 Units, Subcutaneous, Daily at bedtime  . Lancets (FREESTYLE) lancets Use as instructed  . lidocaine (XYLOCAINE) 2 % solution 10 mLs, Mouth/Throat, As needed  . metFORMIN (GLUCOPHAGE) 1,000 mg, Oral, 2 times daily with meals  . metFORMIN (GLUCOPHAGE) 1,000 mg, Oral, 2 times daily with meals  Per aunt, "doesn't take her meds like she's supposed to"   Family History  Problem Relation Age of Onset  . Diabetes Maternal Grandmother    Social History:  reports that she has been smoking cigarettes. She has been smoking about 0.10 packs per day. She has never used smokeless tobacco. She reports current alcohol use. She reports that she does not use drugs. At least 1 ppd active tobacco abuse per family  Exam: Current vital signs: BP 128/82   Pulse (!) 110   Temp 98 F (36.7 C)   Resp (!) 22   Wt 80 kg Comment: per historic, need update when bed working  SpO2 100%   BMI 29.35 kg/m  Vital signs in last 24 hours: Temp:  [98 F (36.7 C)] 98 F (36.7 C) (03/23 1441) Pulse Rate:  [99-121] 110 (03/23 1450) Resp:  [22-30] 22 (03/23 1455) BP: (92-179)/(74-116) 128/82 (03/23 1455) SpO2:  [93 %-100 %] 100 % (03/23 1450) FiO2 (%):  [35 %] 35 % (03/23 1335) Weight:  [80 kg] 80 kg (03/23 1400)   Physical Exam  Constitutional: Appears ill and diaphoretic  Psych: Restless, wild, intermittently cooperative Eyes: No scleral injection HENT: No oropharyngeal obstruction.  MSK: no joint deformities.  Cardiovascular: Normal rate and regular rhythm.  Respiratory: Effort normal, non-labored breathing GI: Soft.  No distension. There is no tenderness.  GU: Slight vaginal bleeding noted after TPA Skin: Warm dry and intact visible skin  Neuro: Mental Status: Patient is awake, intermittently yelling wildly for her family member, able to state that she is 35 years old and that it is March but has very poor attention and keeps trying to turn over onto her  stomach.  She is able to follow simple commands, name and repeat.  She has clear neglect of the left side, sometimes moving it spontaneously more than she can to command Cranial Nerves: II: Visual Fields are notable for a dense left hemianopia III,IV, VI: Strongly prefers looking to the right but can cross over towards the left V: Facial sensation not tested due to agitation VII: Facial movement is notable for a left facial droop VIII: hearing is intact to voice XII: tongue is midline without atrophy or fasciculations.  Motor: Tone is normal. Bulk is normal.  She is strong in the right upper and lower extremities though both of these drift likely due to inattention.  On the left she has trace complex movement in the upper and lower extremity. Sensory: She has not is briskly reactive to touch on the left as she is on the right  Deep Tendon Reflexes: Relatively increased on the right compared to the left patella Plantars: Toes upgoing on the left and downgoing  on the right Cerebellar: Unable to assess secondary to patient's mental status   NIHSS total 19 Score breakdown: 2 points for gaze deviation to the right, 2 points for left hemianopia, 2 points for left facial droop, 3 points for left arm weakness, 1 point for right arm weakness, 3 points for left leg weakness, 1 point for right leg weakness, 2 points for sensory loss on the left, 1 point for dysarthria, 2 points for extinction and inattention  I have reviewed labs in epic and the results pertinent to this consultation are: Blood glucose 412 (likely her baseline given longstanding A1c of >11%) Anion gap of 13 Normal PT/INR (14.3 and 1.2 respectively) Platelets of 238 Hemoglobin of 9.5 from baseline 2 years ago of 12.7, notable for microcytic anemia (with less severe microcytosis on 12/2016), also with an elevated RDW   I have personally reviewed the images obtained:  Head CT aspects of 9 for right basal ganglia hypodensity CTA as  described above with a right internal carotid artery occlusion CT perfusion imaging demonstrated possible core infarct of 51 mL (though notably this was obtained within 2 hours of symptom onset and therefore may reflect an overestimate of the true core).  Mismatch ratio was 2 with a penumbra of 63 mL.   Impression: This is a 35 year old woman with multiple vascular risk factors (very poorly controlled diabetes, likely hypertension, ongoing heavy smoking), presenting with a right internal carotid artery occlusion, concern for atheroembolic source though cardioembolic should also be considered.  Now status post TPA and and en route to [facility] for thrombectomy.  Recommendations:  #Acute right internal carotid artery occlusion -S/p tPA administered with weight-based dosing per recent prior ED visit due to bed weight malfunction in ED -Appreciate transfer to [facility] for thrombectomy -Intubated to facilitate work-up and treatment, sedation with propofol and fentanyl -Strict blood pressure control with systolic blood pressure ideally 160-180 and diastolic blood pressure less than 105  -Labetalol 10 mg IV as needed for blood pressure control, with titration of sedation additionally as needed   Brooke Dare MD-PhD [facility] [phone]  Total critical care time:  122 minutes   Critical care time was exclusive of separately billable procedures and treating other patients.   Critical care was necessary to treat or prevent imminent or life-threatening deterioration.   Critical care was time spent personally by me on the following activities: development of treatment plan with patient and/or surrogate as well as nursing, discussions with consultants/primary team, evaluation of patient's response to treatment, examination of patient, obtaining history from patient or surrogate, ordering and performing treatments and interventions, ordering and review of laboratory studies,  ordering and review of radiographic studies, and re-evaluation of patient's condition as needed, as documented above.

## 2020-07-05 ENCOUNTER — Inpatient Hospital Stay (HOSPITAL_COMMUNITY): Payer: Medicaid Other | Admitting: Certified Registered Nurse Anesthetist

## 2020-07-05 ENCOUNTER — Inpatient Hospital Stay (HOSPITAL_COMMUNITY): Payer: Medicaid Other

## 2020-07-05 ENCOUNTER — Encounter (HOSPITAL_COMMUNITY): Payer: Self-pay | Admitting: Radiology

## 2020-07-05 ENCOUNTER — Encounter (HOSPITAL_COMMUNITY)
Admission: EM | Disposition: A | Discharge status: Skilled Nursing Facility | Payer: Self-pay | Source: Other Acute Inpatient Hospital | Attending: Neurology

## 2020-07-05 DIAGNOSIS — I6389 Other cerebral infarction: Secondary | ICD-10-CM | POA: Diagnosis not present

## 2020-07-05 DIAGNOSIS — E1065 Type 1 diabetes mellitus with hyperglycemia: Secondary | ICD-10-CM

## 2020-07-05 DIAGNOSIS — I70208 Unspecified atherosclerosis of native arteries of extremities, other extremity: Secondary | ICD-10-CM

## 2020-07-05 DIAGNOSIS — I63131 Cerebral infarction due to embolism of right carotid artery: Secondary | ICD-10-CM | POA: Diagnosis not present

## 2020-07-05 DIAGNOSIS — I998 Other disorder of circulatory system: Secondary | ICD-10-CM

## 2020-07-05 DIAGNOSIS — I639 Cerebral infarction, unspecified: Secondary | ICD-10-CM

## 2020-07-05 DIAGNOSIS — F1410 Cocaine abuse, uncomplicated: Secondary | ICD-10-CM | POA: Diagnosis not present

## 2020-07-05 DIAGNOSIS — G936 Cerebral edema: Secondary | ICD-10-CM

## 2020-07-05 DIAGNOSIS — I6601 Occlusion and stenosis of right middle cerebral artery: Secondary | ICD-10-CM | POA: Diagnosis not present

## 2020-07-05 DIAGNOSIS — J9600 Acute respiratory failure, unspecified whether with hypoxia or hypercapnia: Secondary | ICD-10-CM | POA: Diagnosis not present

## 2020-07-05 HISTORY — PX: THROMBECTOMY BRACHIAL ARTERY: SHX6649

## 2020-07-05 LAB — URINALYSIS, COMPLETE (UACMP) WITH MICROSCOPIC
Bacteria, UA: NONE SEEN
Bilirubin Urine: NEGATIVE
Glucose, UA: >=500 mg/dL — AB
Ketones, ur: 5 mg/dL — AB
Leukocytes,Ua: NEGATIVE
Nitrite: NEGATIVE
Protein, ur: NEGATIVE mg/dL
Specific Gravity, Urine: >1.046 — ABNORMAL HIGH (ref 1.005–1.030)
pH: 5 (ref 5.0–8.0)

## 2020-07-05 LAB — GLUCOSE, CAPILLARY
Glucose-Capillary: 401 mg/dL — ABNORMAL HIGH (ref 70–99)
Glucose-Capillary: 139 mg/dL — ABNORMAL HIGH (ref 70–99)
Glucose-Capillary: 156 mg/dL — ABNORMAL HIGH (ref 70–99)
Glucose-Capillary: 156 mg/dL — ABNORMAL HIGH (ref 70–99)
Glucose-Capillary: 170 mg/dL — ABNORMAL HIGH (ref 70–99)
Glucose-Capillary: 155 mg/dL — ABNORMAL HIGH (ref 70–99)
Glucose-Capillary: 81 mg/dL (ref 70–99)
Glucose-Capillary: 326 mg/dL — ABNORMAL HIGH (ref 70–99)

## 2020-07-05 LAB — ECHOCARDIOGRAM COMPLETE
Area-P 1/2: 2.97 cm2
Height: 65 in
S' Lateral: 3.2 cm
Weight: 2546.75 oz

## 2020-07-05 LAB — CBC WITH DIFFERENTIAL/PLATELET
Abs Immature Granulocytes: 0 10*3/uL (ref 0.00–0.07)
Basophils Absolute: 0 10*3/uL (ref 0.0–0.1)
Basophils Relative: 0 %
Eosinophils Absolute: 0.1 10*3/uL (ref 0.0–0.5)
Eosinophils Relative: 1 %
HCT: 32 % — ABNORMAL LOW (ref 36.0–46.0)
Hemoglobin: 8.6 g/dL — ABNORMAL LOW (ref 12.0–15.0)
Lymphocytes Relative: 10 %
Lymphs Abs: 1.4 10*3/uL (ref 0.7–4.0)
MCH: 18.6 pg — ABNORMAL LOW (ref 26.0–34.0)
MCHC: 26.9 g/dL — ABNORMAL LOW (ref 30.0–36.0)
MCV: 69.3 fL — ABNORMAL LOW (ref 80.0–100.0)
Monocytes Absolute: 0.1 10*3/uL (ref 0.1–1.0)
Monocytes Relative: 1 %
Neutro Abs: 12.5 10*3/uL — ABNORMAL HIGH (ref 1.7–7.7)
Neutrophils Relative %: 88 %
Platelets: 160 10*3/uL (ref 150–400)
RBC: 4.62 MIL/uL (ref 3.87–5.11)
RDW: 21.8 % — ABNORMAL HIGH (ref 11.5–15.5)
WBC: 14.2 10*3/uL — ABNORMAL HIGH (ref 4.0–10.5)
nRBC: 0 % (ref 0.0–0.2)
nRBC: 0 /100 WBC

## 2020-07-05 LAB — RAPID URINE DRUG SCREEN, HOSP PERFORMED
Amphetamines: NOT DETECTED
Barbiturates: NOT DETECTED
Benzodiazepines: NOT DETECTED
Cocaine: POSITIVE — AB
Opiates: NOT DETECTED
Tetrahydrocannabinol: POSITIVE — AB

## 2020-07-05 LAB — BASIC METABOLIC PANEL
Anion gap: 9 (ref 5–15)
BUN: <5 mg/dL — ABNORMAL LOW (ref 6–20)
CO2: 25 mmol/L (ref 22–32)
Calcium: 8.1 mg/dL — ABNORMAL LOW (ref 8.9–10.3)
Chloride: 107 mmol/L (ref 98–111)
Creatinine, Ser: 0.61 mg/dL (ref 0.44–1.00)
GFR, Estimated: >60 mL/min (ref >60)
Glucose, Bld: 147 mg/dL — ABNORMAL HIGH (ref 70–99)
Potassium: 3.1 mmol/L — ABNORMAL LOW (ref 3.5–5.1)
Sodium: 141 mmol/L (ref 135–145)

## 2020-07-05 LAB — RPR: RPR Ser Ql: NONREACTIVE

## 2020-07-05 LAB — LIPID PANEL
Cholesterol: 154 mg/dL (ref 0–200)
HDL: 39 mg/dL — ABNORMAL LOW (ref >40)
LDL Cholesterol: 99 mg/dL (ref 0–99)
Total CHOL/HDL Ratio: 3.9 RATIO
Triglycerides: 79 mg/dL (ref <150)
VLDL: 16 mg/dL (ref 0–40)

## 2020-07-05 LAB — C-REACTIVE PROTEIN: CRP: 0.7 mg/dL (ref <1.0)

## 2020-07-05 LAB — POTASSIUM: Potassium: 3.6 mmol/L (ref 3.5–5.1)

## 2020-07-05 LAB — T4, FREE: Free T4: 1.27 ng/dL — ABNORMAL HIGH (ref 0.61–1.12)

## 2020-07-05 LAB — TRIGLYCERIDES: Triglycerides: 86 mg/dL (ref <150)

## 2020-07-05 LAB — VITAMIN B12: Vitamin B-12: 356 pg/mL (ref 180–914)

## 2020-07-05 LAB — MRSA PCR SCREENING: MRSA by PCR: NEGATIVE

## 2020-07-05 LAB — SEDIMENTATION RATE: Sed Rate: 6 mm/hr (ref 0–22)

## 2020-07-05 LAB — TSH: TSH: 0.307 u[IU]/mL — ABNORMAL LOW (ref 0.350–4.500)

## 2020-07-05 LAB — CK: Total CK: 612 U/L — ABNORMAL HIGH (ref 38–234)

## 2020-07-05 SURGERY — THROMBECTOMY, ARTERY, BRACHIAL
Anesthesia: General | Site: Arm Upper | Laterality: Right

## 2020-07-05 MED ORDER — SODIUM CHLORIDE 0.9 % IV SOLN
INTRAVENOUS | Status: AC
  Filled 2020-07-05: qty 1.2

## 2020-07-05 MED ORDER — FENTANYL CITRATE (PF) 250 MCG/5ML IJ SOLN
INTRAMUSCULAR | Status: DC | PRN
  Administered 2020-07-05: 100 ug via INTRAVENOUS
  Administered 2020-07-05: 50 ug via INTRAVENOUS
  Administered 2020-07-05: 100 ug via INTRAVENOUS

## 2020-07-05 MED ORDER — ACETAMINOPHEN 10 MG/ML IV SOLN
INTRAVENOUS | Status: AC
  Filled 2020-07-05: qty 100

## 2020-07-05 MED ORDER — ACETAMINOPHEN 10 MG/ML IV SOLN
INTRAVENOUS | Status: DC | PRN
  Administered 2020-07-05: 1000 mg via INTRAVENOUS

## 2020-07-05 MED ORDER — SODIUM CHLORIDE 3 % IV BOLUS
250.0000 mL | Freq: Once | INTRAVENOUS | Status: AC
  Administered 2020-07-05: 250 mL via INTRAVENOUS

## 2020-07-05 MED ORDER — CEFAZOLIN SODIUM-DEXTROSE 2-3 GM-%(50ML) IV SOLR
INTRAVENOUS | Status: DC | PRN
  Administered 2020-07-05: 2 g via INTRAVENOUS

## 2020-07-05 MED ORDER — CHLORHEXIDINE GLUCONATE CLOTH 2 % EX PADS
6.0000 | MEDICATED_PAD | Freq: Every day | CUTANEOUS | Status: DC
  Administered 2020-07-06 – 2020-08-03 (×30): 6 via TOPICAL

## 2020-07-05 MED ORDER — ROCURONIUM BROMIDE 10 MG/ML (PF) SYRINGE
PREFILLED_SYRINGE | INTRAVENOUS | Status: DC | PRN
  Administered 2020-07-05: 100 mg via INTRAVENOUS
  Administered 2020-07-05 (×2): 50 mg via INTRAVENOUS

## 2020-07-05 MED ORDER — FENTANYL CITRATE (PF) 250 MCG/5ML IJ SOLN
INTRAMUSCULAR | Status: AC
  Filled 2020-07-05: qty 5

## 2020-07-05 MED ORDER — SENNOSIDES-DOCUSATE SODIUM 8.6-50 MG PO TABS: 1.0000 | ORAL_TABLET | Freq: Every evening | ORAL | Status: DC | PRN

## 2020-07-05 MED ORDER — POTASSIUM CHLORIDE 20 MEQ PO PACK
40.0000 meq | PACK | Freq: Two times a day (BID) | ORAL | Status: AC
  Administered 2020-07-05 (×2): 40 meq
  Filled 2020-07-05 (×2): qty 2

## 2020-07-05 MED ORDER — SODIUM CHLORIDE 3 % IV SOLN
INTRAVENOUS | Status: DC
  Filled 2020-07-05 (×5): qty 500

## 2020-07-05 MED ORDER — POTASSIUM CHLORIDE CRYS ER 20 MEQ PO TBCR
40.0000 meq | EXTENDED_RELEASE_TABLET | Freq: Two times a day (BID) | ORAL | Status: DC
  Filled 2020-07-05: qty 2

## 2020-07-05 MED ORDER — SODIUM CHLORIDE 0.9 % IV SOLN
INTRAVENOUS | Status: DC | PRN
  Administered 2020-07-05: 500 mL

## 2020-07-05 MED ORDER — LACTATED RINGERS IV SOLN: INTRAVENOUS | Status: DC | PRN

## 2020-07-05 MED ORDER — 0.9 % SODIUM CHLORIDE (POUR BTL) OPTIME
TOPICAL | Status: DC | PRN
  Administered 2020-07-05: 1000 mL

## 2020-07-05 SURGICAL SUPPLY — 39 items
ADH SKN CLS APL DERMABOND .7 (GAUZE/BANDAGES/DRESSINGS) ×1
ARMBAND PINK RESTRICT EXTREMIT (MISCELLANEOUS) ×2 IMPLANT
CANISTER SUCT 1200ML W/VALVE (MISCELLANEOUS) ×2 IMPLANT
CATH EMB 2FR 60CM (CATHETERS) ×2 IMPLANT
CATH EMB 3FR 40CM (CATHETERS) ×2 IMPLANT
CATH EMB 3FR 80CM (CATHETERS) IMPLANT
CATH EMB 4FR 80CM (CATHETERS) IMPLANT
CATH EMB 5FR 80CM (CATHETERS) IMPLANT
CATH EMB LATEX FREE 2FRX60CM (CATHETERS) ×2
CATH EMB LF 2FRX60 (CATHETERS) ×1 IMPLANT
CLIP VESOCCLUDE MED 6/CT (CLIP) ×2 IMPLANT
CLIP VESOCCLUDE SM WIDE 6/CT (CLIP) ×2 IMPLANT
CNTNR URN SCR LID CUP LEK RST (MISCELLANEOUS) ×1 IMPLANT
CONT SPEC 4OZ STRL OR WHT (MISCELLANEOUS) ×2
COVER PROBE W GEL 5X96 (DRAPES) ×2 IMPLANT
COVER WAND RF STERILE (DRAPES) ×2 IMPLANT
DERMABOND ADVANCED (GAUZE/BANDAGES/DRESSINGS) ×1
DERMABOND ADVANCED .7 DNX12 (GAUZE/BANDAGES/DRESSINGS) ×1 IMPLANT
ELECT REM PT RETURN 9FT ADLT (ELECTROSURGICAL) ×2
ELECTRODE REM PT RTRN 9FT ADLT (ELECTROSURGICAL) ×1 IMPLANT
GLOVE SURG ENC MOIS LTX SZ7.5 (GLOVE) ×2 IMPLANT
GOWN STRL REUS W/ TWL LRG LVL3 (GOWN DISPOSABLE) ×2 IMPLANT
GOWN STRL REUS W/ TWL XL LVL3 (GOWN DISPOSABLE) ×1 IMPLANT
GOWN STRL REUS W/TWL LRG LVL3 (GOWN DISPOSABLE) ×4
GOWN STRL REUS W/TWL XL LVL3 (GOWN DISPOSABLE) ×2
KIT BASIN OR (CUSTOM PROCEDURE TRAY) ×2 IMPLANT
KIT TURNOVER KIT B (KITS) ×2 IMPLANT
LOOP VESSEL MINI RED (MISCELLANEOUS) ×2 IMPLANT
NS IRRIG 1000ML POUR BTL (IV SOLUTION) ×2 IMPLANT
PACK CV ACCESS (CUSTOM PROCEDURE TRAY) ×2 IMPLANT
PAD ARMBOARD 7.5X6 YLW CONV (MISCELLANEOUS) ×4 IMPLANT
SUT MNCRL AB 4-0 PS2 18 (SUTURE) ×2 IMPLANT
SUT PROLENE 6 0 BV (SUTURE) ×22 IMPLANT
SUT VIC AB 3-0 SH 27 (SUTURE) ×2
SUT VIC AB 3-0 SH 27X BRD (SUTURE) ×1 IMPLANT
SYR 30ML LL (SYRINGE) ×2 IMPLANT
TOWEL GREEN STERILE (TOWEL DISPOSABLE) ×2 IMPLANT
UNDERPAD 30X36 HEAVY ABSORB (UNDERPADS AND DIAPERS) ×2 IMPLANT
WATER STERILE IRR 1000ML POUR (IV SOLUTION) ×2 IMPLANT

## 2020-07-05 NOTE — Consult Note (Addendum)
This is a progress note   NAME:  Brandi Myers, MRN:  [id number], DOB:  July 10, 1985, LOS: 1 ADMISSION DATE:  07/04/2020, CONSULTATION DATE:  07/04/2020 REFERRING MD:  Dr. Corliss Skains, CHIEF COMPLAINT:  Vent management    History of Present Illness:  Brandi Myers is a 35 y.o. with PMH significant for HTN and type 2 diabetes who presented as a code stroke due to left sided weakness and slurred speech.  On arrival patient was also seen altered/combative requiring intubation to obtain CT imaging to rule out stroke.  Head CT demonstrated right ICA occlusion as well as right common carotid bifurcation thrombus resulting in acute stroke.  Patient received TPA in ED at [facility] after which she was transferred to [facility] for further neurological work-up and interventions including thrombectomy.  On arrival to [facility] patient underwent bilateral common carotid arteriograms with revascularization of occluded terminal ICA and right MCA and right ACA.  Post intervention patient will remain intubated and was transferred to neuro ICU. Critical care consulted for ventilator management  Pertinent  Medical History   Uncontrolled diabetes and hypertension  Significant Hospital Events: Including procedures, antibiotic start and stop dates in addition to other pertinent events   .  3/23 > admitted as a code stroke and found to have occluded right ICA resulting in stroke.  Underwent IR revascularization and remained intubated post procedure  Interim History / Subjective:  As above  Objective   Blood pressure 135/89, pulse (!) 120, temperature 99 F (37.2 C), temperature source Axillary, resp. rate 18, height 5\' 5"  (1.651 m), weight 72.2 kg, SpO2 99 %.    Vent Mode: PRVC FiO2 (%):  [35 %-100 %] 40 % Set Rate:  [16 bmp-22 bmp] 16 bmp Vt Set:  [450 mL] 450 mL PEEP:  [5 cmH20] 5 cmH20 Plateau Pressure:  [13 cmH20-17 cmH20] 15 cmH20   Intake/Output Summary (Last 24 hours) at 07/05/2020 0825 Last data filed  at 07/05/2020 0600 Gross per 24 hour  Intake 1434.96 ml  Output 550 ml  Net 884.96 ml   Filed Weights   07/04/20 1730  Weight: 72.2 kg    Examination: Constitutional: young woman sedated on vent  Eyes: pupils equal, not tracking Ears, nose, mouth, and throat: ETT in place Cardiovascular: RRR, ext warm Respiratory: clear, no wheezing, triggers vent Gastrointestinal: soft, +BS Skin: No rashes, normal turgor Neurologic: she is not moving her left side, R side purposeful, follows commands Psychiatric: RASS -3  Mild H/H drop Cr is okay Sugars look okay WBC improved Low TSH  Resolved Hospital Problem list     Assessment & Plan:  Occluded right ICA resulting in acute infarct Acute respiratory insufficiency secondary to above Uncontrolled type 2 diabetes Low TSH- check T4 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, hx polysubstance abuse Tobacco abuse Hypokalemia  - Continue vent support, VAP prevention bundle - Given her issues with sedation on prior scans, leave intubated until CT and MRI head performed today then work toward weaning - Goal SBP < 160 - GDMT and stroke workup per neurology - Monitor for s/s of withdrawal - Insulin as ordered seems to be working - If not going to be extubated today should start TF - Will need PT/OT/SLP when extubated  Best practice (evaluated daily)  Diet:  NPO Pain/Anxiety/Delirium protocol (if indicated): Yes (RASS goal -1) VAP protocol (if indicated): Yes DVT prophylaxis: SCD GI prophylaxis: N/A and PPI Glucose control:  Basal + SSI Central venous access:  N/A Arterial line:  N/A Foley:  N/A Mobility:  bed rest  PT consulted: Yes Last date of multidisciplinary goals of care discussion Pending  Code Status:  full code Disposition: ICU   Patient critically ill due to respiratory failure Interventions to address this today ventilator weaning Risk of deterioration without these interventions is high  I personally spent 38 minutes providing critical  care not including any separately billable procedures  Myrla Halsted MD [facility]  Prefer epic messenger for cross cover needs If after hours, please call E-link

## 2020-07-05 NOTE — Anesthesia Preprocedure Evaluation (Signed)
Anesthesia Evaluation  Patient identified by MRN, date of birth, ID band Patient unresponsive    Reviewed: Allergy & Precautions, NPO status , Patient's Chart, lab work & pertinent test results  Airway Mallampati: Intubated       Dental no notable dental hx.    Pulmonary neg pulmonary ROS, Current Smoker,    Pulmonary exam normal        Cardiovascular hypertension, + Peripheral Vascular Disease (ischemic right arm)  Normal cardiovascular exam Rhythm:Regular Rate:Normal     Neuro/Psych Acute left MCA stroke 07/04/20 CVA negative psych ROS   GI/Hepatic negative GI ROS, Neg liver ROS,   Endo/Other  diabetes, Well Controlled, Type 2, Insulin Dependent  Renal/GU negative Renal ROS  negative genitourinary   Musculoskeletal negative musculoskeletal ROS (+)   Abdominal Normal abdominal exam  (+)   Peds  Hematology  (+) Blood dyscrasia, anemia ,   Anesthesia Other Findings   Reproductive/Obstetrics                             Anesthesia Physical  Anesthesia Plan  ASA: IV and emergent  Anesthesia Plan: General   Post-op Pain Management:    Induction: Intravenous  PONV Risk Score and Plan: 2 and Ondansetron and Dexamethasone  Airway Management Planned: Oral ETT  Additional Equipment: Arterial line  Intra-op Plan:   Post-operative Plan: Post-operative intubation/ventilation  Informed Consent:   Plan Discussed with: CRNA  Anesthesia Plan Comments: (Lab Results      Component                Value               Date                      WBC                      14.2 (H)            07/05/2020                HGB                      8.6 (L)             07/05/2020                HCT                      32.0 (L)            07/05/2020                MCV                      69.3 (L)            07/05/2020                PLT                      160                 07/05/2020            Lab Results      Component                Value  Date                      NA                       141                 07/05/2020                K                        3.1 (L)             07/05/2020                CO2                      25                  07/05/2020                GLUCOSE                  147 (H)             07/05/2020                BUN                      <5 (L)              07/05/2020                CREATININE               0.61                07/05/2020                CALCIUM                  8.1 (L)             07/05/2020                GFRNONAA                 >60                 07/05/2020                GFRAA                    >60                 01/04/2018          )        Anesthesia Quick Evaluation

## 2020-07-05 NOTE — Progress Notes (Signed)
RT NOTE:  Pt transported to CT and back to 4N22 without event.

## 2020-07-05 NOTE — Progress Notes (Signed)
Chief Complaint: Patient was seen today for CVA post intervention  Supervising Physician: Luanne Bras  Patient Status: [facility] - In-pt  Subjective: S/P bilateral common carotid arteriogram and RT Vert arteriogram  followed by revascularization occluded terminal ICA and Rt MCA and RT ACA A1 with x 1 pass with 74m x 37 mm embotrap retriever and aspiration achieving achieving a TICI 2B revascularization. Multiple distal occlusions of sup and inf divisions in the M4 regions noted. Good  Retrograde collateral reconstitution from RT P3 collaterals  Of post MCA distribution. Post CT contrast stain=/- petechial hemorrhage in RT post putamen.  Pt sedated on propfol, was about to go down for MRI Per RN, movement of right extremities, none on left side.  Objective: Physical Exam: BP 135/89    Pulse (!) 120    Temp 98.7 F (37.1 C) (Axillary)    Resp 18    Ht 5' 5"  (1.651 m)    Wt 72.2 kg    SpO2 99%    BMI 26.49 kg/m  Sedated on vent   Current Facility-Administered Medications:     stroke: mapping our early stages of recovery book, , Does not apply, Once, LKerney Elbe MD   0.9 % NaCl with KCl 40 mEq / L  infusion, , Intravenous, Continuous, SCandee Furbish MD, Last Rate: 75 mL/hr at 07/04/20 2007, New Bag at 07/04/20 2007   acetaminophen (TYLENOL) tablet 650 mg, 650 mg, Oral, Q4H PRN **OR** acetaminophen (TYLENOL) 160 MG/5ML solution 650 mg, 650 mg, Per Tube, Q4H PRN **OR** acetaminophen (TYLENOL) suppository 650 mg, 650 mg, Rectal, Q4H PRN, Deveshwar, Sanjeev, MD   chlorhexidine gluconate (MEDLINE KIT) (PERIDEX) 0.12 % solution 15 mL, 15 mL, Mouth Rinse, BID, SCandee Furbish MD, 15 mL at 07/04/20 2008   Chlorhexidine Gluconate Cloth 2 % PADS 6 each, 6 each, Topical, Daily, Ogan, Okoron[place]wo U, MD   clevidipine (CLEVIPREX) infusion 0.5 mg/mL, 0-21 mg/hr, Intravenous, Continuous, Deveshwar, Sanjeev, MD, Last Rate: 14 mL/hr at 07/05/20 0731, 7 mg/hr at 07/05/20 0731   docusate  (COLACE) 50 MG/5ML liquid 100 mg, 100 mg, Per Tube, BID, Bowser, GLaurel Dimmer NP, 100 mg at 07/04/20 2308   fentaNYL (SUBLIMAZE) injection 50 mcg, 50 mcg, Intravenous, Q15 min PRN, Bowser, GLaurel Dimmer NP   fentaNYL (SUBLIMAZE) injection 50-200 mcg, 50-200 mcg, Intravenous, Q30 min PRN, Bowser, Grace E, NP, 200 mcg at 07/04/20 2123   fentaNYL 25043m in NS 25068m20m24ml) infusion-PREMIX, 0-400 mcg/hr, Intravenous, Continuous, SmitCandee Furbish, Last Rate: 5 mL/hr at 07/05/20 0600, 50 mcg/hr at 07/05/20 0600   gabapentin (NEURONTIN) 250 MG/5ML solution 600 mg, 600 mg, Per Tube, BID, LindKerney Elbe, 600 mg at 07/04/20 2308   insulin aspart (novoLOG) injection 0-15 Units, 0-15 Units, Subcutaneous, Q4H, LindKerney Elbe, 2 Units at 07/05/20 0514   insulin detemir (LEVEMIR) injection 15 Units, 15 Units, Subcutaneous, BID, SmitCandee Furbish, 15 Units at 07/04/20 2307   MEDLINE mouth rinse, 15 mL, Mouth Rinse, 10 times per day, SmitCandee Furbish, 15 mL at 07/05/20 0523   midazolam (VERSED) injection 2 mg, 2 mg, Intravenous, Q2H PRN, SmitCandee Furbish   pantoprazole (PROTONIX) injection 40 mg, 40 mg, Intravenous, QHS, LindKerney Elbe, 40 mg at 07/04/20 2309   polyethylene glycol (MIRALAX / GLYCOLAX) packet 17 g, 17 g, Per Tube, Daily, Bowser, GracLaurel Dimmer   potassium chloride SA (KLOR-CON) CR tablet 40 mEq, 40 mEq, Oral, BID, SmitCandee Furbish   propofol (  DIPRIVAN) 1000 MG/100ML infusion, 0-50 mcg/kg/min, Intravenous, Continuous, Bowser, Laurel Dimmer, NP, Last Rate: 19.2 mL/hr at 07/05/20 1003, 40 mcg/kg/min at 07/05/20 1003   senna-docusate (Senokot-S) tablet 1 tablet, 1 tablet, Oral, QHS PRN, Kerney Elbe, MD  Labs: CBC Recent Labs    07/04/20 1247 07/04/20 1844 07/05/20 0541  WBC 17.8*  --  14.2*  HGB 9.5* 9.9* 8.6*  HCT 35.0* 29.0* 32.0*  PLT 235  --  160   BMET Recent Labs    07/04/20 1337 07/04/20 1844 07/04/20 2304 07/05/20 0541  NA 133* 140  --  141  K 2.9* 3.1*  3.6 3.1*  CL 99  --   --  107  CO2 21*  --   --  25  GLUCOSE 448*  --   --  147*  BUN <5*  --   --  <5*  CREATININE 0.59  --   --  0.61  CALCIUM 8.3*  --   --  8.1*   LFT Recent Labs    07/04/20 1337  PROT 6.6  ALBUMIN 3.3*  AST 23  ALT 14  ALKPHOS 84  BILITOT 1.0   PT/INR Recent Labs    07/04/20 1337  LABPROT 14.3  INR 1.2     Studies/Results: CT Angio Head W/Cm &/Or Wo Cm  Result Date: 07/04/2020 CLINICAL DATA:  Left-sided weakness and slurred speech EXAM: CT HEAD WITHOUT CONTRAST CT ANGIOGRAPHY HEAD AND NECK CT PERFUSION BRAIN TECHNIQUE: Multi detector CT imaging of the head was performed using the standard protocol without intravenous contrast. Multidetector CT imaging of the head and neck was performed using the standard protocol during bolus administration of intravenous contrast. Multiplanar CT image reconstructions and MIPs were obtained to evaluate the vascular anatomy. Carotid stenosis measurements (when applicable) are obtained utilizing NASCET criteria, using the distal internal carotid diameter as the denominator. Multiphase CT imaging of the brain was performed following IV bolus contrast injection. Subsequent parametric perfusion maps were calculated using RAPID software. CONTRAST:  180m OMNIPAQUE IOHEXOL 350 MG/ML SOLN COMPARISON:  None. FINDINGS: CT HEAD FINDINGS Brain: There is no acute intracranial hemorrhage or mass effect. Loss of gray-white differentiation involving a portion of the right lentiform nucleus. Ventricles and sulci are normal in size and configuration. No extra-axial collection. Vascular: No hyperdense vessel. Skull: Unremarkable. Sinuses/Orbits: Lobular maxillary sinus mucosal thickening. Orbits are unremarkable. Other: Minor patchy left mastoid tip opacification. ASPECTS ([facility]) - Ganglionic level infarction (caudate, lentiform nuclei, internal capsule, insula, M1-M3 cortex): 6 - Supraganglionic infarction (M4-M6  cortex): 3 Total score (0-10 with 10 being normal): 9 CTA NECK FINDINGS Aortic arch: Normal caliber aorta.  Great vessel origins are patent. Right carotid system: Common carotid is patent. There is thrombus at the bifurcation extending into the external carotid. Less than 50% stenosis at the ICA origin. The cervical ICA is relatively smaller in caliber. Left carotid system: Patent.  No stenosis. Vertebral arteries: Patent. Right vertebral artery slightly dominant. No stenosis. Skeleton: Unremarkable. Other neck: Thyroid not well evaluated due to streak artifact. Likely reactive cervical lymph nodes. Upper chest: Included lungs are clear. Endotracheal tube is present. Review of the MIP images confirms the above findings CTA HEAD FINDINGS Anterior circulation: Proximal intracranial internal carotid artery is patent. There is loss of enhancement at the level of the supraclinoid portion. Faint enhancement is seen along the right M1 MCA. There is reconstitution of M2 branches. Patent right A1 ACA. Anterior communicating artery is present. Left anterior and middle cerebral  arteries are patent. Posterior circulation: Intracranial vertebral arteries, basilar artery, and posterior cerebral arteries are patent. Venous sinuses: As permitted by contrast timing, patent. Review of the MIP images confirms the above findings CT Brain Perfusion Findings: CBF (<30%) Volume: 85m Perfusion (Tmax>6.0s) volume: 1166mMismatch Volume: 6381mnfarction Location: Right MCA territory IMPRESSION: No acute intracranial hemorrhage. 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 Reconstitution of right MCA at the bifurcation. Right A1 ACA is patent. Anterior communicating artery is present. No  posterior communicating artery is identified. Perfusion imaging demonstrates core infarction of 51 mL. This is greater than anticipated by noncontrast CT. Penumbra is 63 mL and therefore may be greater than calculated. Initial results were called by telephone at the time of interpretation on 07/04/2020 at 1:58 pm to provider Dr. BhaCurly Shoresho verbally acknowledged these results. Electronically Signed   By: PraMacy MisD.   On: 07/04/2020 14:10   DG Chest 1 View  Result Date: 07/04/2020 CLINICAL DATA:  Intubation EXAM: CHEST  1 VIEW COMPARISON:  01/12/2016 FINDINGS: Endotracheal tube is 3.6 cm above the carina. NG tube is in the stomach. No confluent opacities or effusions. Heart is normal size. No acute bony abnormality. IMPRESSION: Support devices in expected position. No acute cardiopulmonary disease. Electronically Signed   By: KevRolm BaptiseD.   On: 07/04/2020 15:09   CT Angio Neck W and/or Wo Contrast  Result Date: 07/04/2020 CLINICAL DATA:  Left-sided weakness and slurred speech EXAM: CT HEAD WITHOUT CONTRAST CT ANGIOGRAPHY HEAD AND NECK CT PERFUSION BRAIN TECHNIQUE: Multi detector CT imaging of the head was performed using the standard protocol without intravenous contrast. Multidetector CT imaging of the head and neck was performed using the standard protocol during bolus administration of intravenous contrast. Multiplanar CT image reconstructions and MIPs were obtained to evaluate the vascular anatomy. Carotid stenosis measurements (when applicable) are obtained utilizing NASCET criteria, using the distal internal carotid diameter as the denominator. Multiphase CT imaging of the brain was performed following IV bolus contrast injection. Subsequent parametric perfusion maps were calculated using RAPID software. CONTRAST:  100m10mNIPAQUE IOHEXOL 350 MG/ML SOLN COMPARISON:  None. FINDINGS: CT HEAD FINDINGS Brain: There is no acute intracranial hemorrhage or mass effect. Loss of gray-white  differentiation involving a portion of the right lentiform nucleus. Ventricles and sulci are normal in size and configuration. No extra-axial collection. Vascular: No hyperdense vessel. Skull: Unremarkable. Sinuses/Orbits: Lobular maxillary sinus mucosal thickening. Orbits are unremarkable. Other: Minor patchy left mastoid tip opacification. ASPECTS ([facility] CT Score) - Ganglionic level infarction (caudate, lentiform nuclei, internal capsule, insula, M1-M3 cortex): 6 - Supraganglionic infarction (M4-M6 cortex): 3 Total score (0-10 with 10 being normal): 9 CTA NECK FINDINGS Aortic arch: Normal caliber aorta.  Great vessel origins are patent. Right carotid system: Common carotid is patent. There is thrombus at the bifurcation extending into the external carotid. Less than 50% stenosis at the ICA origin. The cervical ICA is relatively smaller in caliber. Left carotid system: Patent.  No stenosis. Vertebral arteries: Patent. Right vertebral artery slightly dominant. No stenosis. Skeleton: Unremarkable. Other neck: Thyroid not well evaluated due to streak artifact. Likely reactive cervical lymph nodes. Upper chest: Included lungs are clear. Endotracheal tube is present. Review of the MIP images confirms the above findings CTA HEAD FINDINGS  Anterior circulation: Proximal intracranial internal carotid artery is patent. There is loss of enhancement at the level of the supraclinoid portion. Faint enhancement is seen along the right M1 MCA. There is reconstitution of M2 branches. Patent right A1 ACA. Anterior communicating artery is present. Left anterior and middle cerebral arteries are patent. Posterior circulation: Intracranial vertebral arteries, basilar artery, and posterior cerebral arteries are patent. Venous sinuses: As permitted by contrast timing, patent. Review of the MIP images confirms the above findings CT Brain Perfusion Findings: CBF (<30%) Volume: 50m Perfusion (Tmax>6.0s) volume: 1129m Mismatch Volume: 6325mnfarction Location: Right MCA territory IMPRESSION: No acute intracranial hemorrhage. 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 Reconstitution of right MCA at the bifurcation. Right A1 ACA is patent. Anterior communicating artery is present. No posterior communicating artery is identified. Perfusion imaging demonstrates core infarction of 51 mL. This is greater than anticipated by noncontrast CT. Penumbra is 63 mL and therefore may be greater than calculated. Initial results were called by telephone at the time of interpretation on 07/04/2020 at 1:58 pm to provider Dr. BhaCurly Shoresho verbally acknowledged these results. Electronically Signed   By: PraMacy MisD.   On: 07/04/2020 14:10   MR ANGIO HEAD WO CONTRAST  Result Date: 07/05/2020 CLINICAL DATA:  Stroke.  Post thrombectomy and tPA EXAM: MRI HEAD WITHOUT CONTRAST MRA HEAD WITHOUT CONTRAST TECHNIQUE: Multiplanar, multiecho pulse sequences of the brain and surrounding structures were obtained without intravenous contrast. Angiographic images of the head were obtained using MRA technique without contrast. COMPARISON:  CT angio head and neck 07/04/2020 FINDINGS: MRI HEAD FINDINGS Brain: Acute infarct right MCA territory. This involves much of the right basal ganglia as well as the right frontal and temporal lobe. There is hemorrhagic transformation with mild-to-moderate blood in the infarct. There is local mass-effect and midline shift of 6 mm. No hydrocephalus. No evidence of chronic ischemia. No mass lesion. Vascular: Normal arterial flow voids at the base of brain. Skull and upper cervical spine: No focal skeletal abnormality. Sinuses/Orbits: Moderate mucosal edema paranasal  sinuses. Bilateral mastoid effusion normal orbit Other: None MRA HEAD FINDINGS Both vertebral arteries are normal. Left PICA patent. Large right AICA patent. Basilar patent. Superior cerebellar and posterior cerebral arteries normal bilaterally. Internal carotid artery is widely patent bilaterally without stenosis or filling defect. Anterior and middle cerebral arteries widely patent bilaterally without stenosis or occlusion. No aneurysm. 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 Hemorrhagic transformation with mild to moderate hemorrhage in the infarct. There is mass-effect and 6 mm midline shift to left Negative MRA head Electronically Signed   By: ChaFranchot GalloD.   On: 07/05/2020 10:01   MR BRAIN WO CONTRAST  Result Date: 07/05/2020 CLINICAL DATA:  Stroke.  Post thrombectomy and tPA EXAM: MRI HEAD WITHOUT CONTRAST MRA HEAD WITHOUT CONTRAST TECHNIQUE: Multiplanar, multiecho pulse sequences of the brain and surrounding structures were obtained without intravenous contrast. Angiographic images of the head were obtained using MRA technique without contrast. COMPARISON:  CT angio head and neck 07/04/2020 FINDINGS: MRI HEAD FINDINGS Brain: Acute infarct right MCA territory. This involves much of the right basal ganglia as well as the right frontal and temporal lobe. There is hemorrhagic transformation with mild-to-moderate blood in the infarct. There is  local mass-effect and midline shift of 6 mm. No hydrocephalus. No evidence of chronic ischemia. No mass lesion. Vascular: Normal arterial flow voids at the base of brain. Skull and upper cervical spine: No focal skeletal abnormality. Sinuses/Orbits: Moderate mucosal edema paranasal sinuses. Bilateral mastoid effusion normal orbit Other: None MRA HEAD FINDINGS Both vertebral arteries are normal. Left PICA patent. Large right AICA patent. Basilar patent. Superior cerebellar and posterior cerebral arteries  normal bilaterally. Internal carotid artery is widely patent bilaterally without stenosis or filling defect. Anterior and middle cerebral arteries widely patent bilaterally without stenosis or occlusion. No aneurysm. 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 Hemorrhagic transformation with mild to moderate hemorrhage in the infarct. There is mass-effect and 6 mm midline shift to left Negative MRA head Electronically Signed   By: Franchot Gallo M.D.   On: 07/05/2020 10:01   CT CEREBRAL PERFUSION W CONTRAST  Result Date: 07/04/2020 CLINICAL DATA:  Left-sided weakness and slurred speech EXAM: CT HEAD WITHOUT CONTRAST CT ANGIOGRAPHY HEAD AND NECK CT PERFUSION BRAIN TECHNIQUE: Multi detector CT imaging of the head was performed using the standard protocol without intravenous contrast. Multidetector CT imaging of the head and neck was performed using the standard protocol during bolus administration of intravenous contrast. Multiplanar CT image reconstructions and MIPs were obtained to evaluate the vascular anatomy. Carotid stenosis measurements (when applicable) are obtained utilizing NASCET criteria, using the distal internal carotid diameter as the denominator. Multiphase CT imaging of the brain was performed following IV bolus contrast injection. Subsequent parametric perfusion maps were calculated using RAPID software. CONTRAST:  134m OMNIPAQUE IOHEXOL 350 MG/ML SOLN COMPARISON:  None. FINDINGS: CT HEAD FINDINGS Brain: There is no acute intracranial hemorrhage or mass effect. Loss of gray-white differentiation involving a portion of the right lentiform nucleus. Ventricles and sulci are normal in size and configuration. No extra-axial collection. Vascular: No hyperdense vessel. Skull: Unremarkable. Sinuses/Orbits: Lobular maxillary sinus mucosal thickening. Orbits are unremarkable. Other: Minor patchy left mastoid tip opacification. ASPECTS ([facility] Early CT Score) - Ganglionic level infarction (caudate, lentiform nuclei, internal capsule, insula, M1-M3 cortex): 6 - Supraganglionic infarction (M4-M6 cortex): 3 Total score (0-10 with 10 being normal): 9 CTA NECK FINDINGS Aortic arch: Normal caliber aorta.  Great vessel origins are patent. Right carotid system: Common carotid is patent. There is thrombus at the bifurcation extending into the external carotid. Less than 50% stenosis at the ICA origin. The cervical ICA is relatively smaller in caliber. Left carotid system: Patent.  No stenosis. Vertebral arteries: Patent. Right vertebral artery slightly dominant. No stenosis. Skeleton: Unremarkable. Other neck: Thyroid not well evaluated due to streak artifact. Likely reactive cervical lymph nodes. Upper chest: Included lungs are clear. Endotracheal tube is present. Review of the MIP images confirms the above findings CTA HEAD FINDINGS Anterior circulation: Proximal intracranial internal carotid artery is patent. There is loss of enhancement at the level of the supraclinoid portion. Faint enhancement is seen along the right M1 MCA. There is reconstitution of M2 branches. Patent right A1 ACA. Anterior communicating artery is present. Left anterior and middle cerebral arteries are patent. Posterior circulation: Intracranial vertebral arteries, basilar artery, and posterior cerebral arteries are patent. Venous sinuses: As permitted by contrast timing, patent. Review of the MIP images confirms the above findings CT Brain Perfusion Findings: CBF (<30%) Volume: 575mPerfusion (Tmax>6.0s) volume: 11423mismatch Volume: 48m67mfarction Location: Right MCA territory IMPRESSION: No acute intracranial hemorrhage. Acute infarction involving the right lentiform nucleus (  ASPECT score 9). 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 Occlusion of the supraclinoid right ICA probably  reflecting embolization from above thrombus. Reconstitution of right MCA at the bifurcation. Right A1 ACA is patent. Anterior communicating artery is present. No posterior communicating artery is identified. Perfusion imaging demonstrates core infarction of 51 mL. This is greater than anticipated by noncontrast CT. Penumbra is 63 mL and therefore may be greater than calculated. Initial results were called by telephone at the time of interpretation on 07/04/2020 at 1:58 pm to provider Dr. Curly Shores, who verbally acknowledged these results. Electronically Signed   By: Macy Mis M.D.   On: 07/04/2020 14:10   DG Abd Portable 1 View  Result Date: 07/04/2020 CLINICAL DATA:  OG tube placement. EXAM: PORTABLE ABDOMEN - 1 VIEW COMPARISON:  No prior. FINDINGS: OG tube noted with tip over the stomach. No gastric or bowel distention noted. Contrast noted both kidneys. Degenerative change lumbar spine. Surgical clips right upper quadrant. IMPRESSION: OG tube noted with tip over the stomach. No gastric or bowel distention noted. Electronically Signed   By: Marcello Moores  Register   On: 07/04/2020 15:13   CT HEAD CODE STROKE WO CONTRAST  Result Date: 07/04/2020 CLINICAL DATA:  Left-sided weakness and slurred speech EXAM: CT HEAD WITHOUT CONTRAST CT ANGIOGRAPHY HEAD AND NECK CT PERFUSION BRAIN TECHNIQUE: Multi detector CT imaging of the head was performed using the standard protocol without intravenous contrast. Multidetector CT imaging of the head and neck was performed using the standard protocol during bolus administration of intravenous contrast. Multiplanar CT image reconstructions and MIPs were obtained to evaluate the vascular anatomy. Carotid stenosis measurements (when applicable) are obtained utilizing NASCET criteria, using the distal internal carotid diameter as the denominator. Multiphase CT imaging of the brain was performed following IV bolus contrast injection. Subsequent parametric perfusion maps were calculated  using RAPID software. CONTRAST:  158m OMNIPAQUE IOHEXOL 350 MG/ML SOLN COMPARISON:  None. FINDINGS: CT HEAD FINDINGS Brain: There is no acute intracranial hemorrhage or mass effect. Loss of gray-white differentiation involving a portion of the right lentiform nucleus. Ventricles and sulci are normal in size and configuration. No extra-axial collection. Vascular: No hyperdense vessel. Skull: Unremarkable. Sinuses/Orbits: Lobular maxillary sinus mucosal thickening. Orbits are unremarkable. Other: Minor patchy left mastoid tip opacification. [facility] ([facility]) - Ganglionic level infarction (caudate, lentiform nuclei, internal capsule, insula, M1-M3 cortex): 6 - Supraganglionic infarction (M4-M6 cortex): 3 Total score (0-10 with 10 being normal): 9 CTA NECK FINDINGS Aortic arch: Normal caliber aorta.  Great vessel origins are patent. Right carotid system: Common carotid is patent. There is thrombus at the bifurcation extending into the external carotid. Less than 50% stenosis at the ICA origin. The cervical ICA is relatively smaller in caliber. Left carotid system: Patent.  No stenosis. Vertebral arteries: Patent. Right vertebral artery slightly dominant. No stenosis. Skeleton: Unremarkable. Other neck: Thyroid not well evaluated due to streak artifact. Likely reactive cervical lymph nodes. Upper chest: Included lungs are clear. Endotracheal tube is present. Review of the MIP images confirms the above findings CTA HEAD FINDINGS Anterior circulation: Proximal intracranial internal carotid artery is patent. There is loss of enhancement at the level of the supraclinoid portion. Faint enhancement is seen along the right M1 MCA. There is reconstitution of M2 branches. Patent right A1 ACA. Anterior communicating artery is present. Left anterior and middle cerebral arteries are patent. Posterior circulation: Intracranial vertebral  arteries, basilar artery, and posterior cerebral arteries are  patent. Venous sinuses: As permitted by contrast timing, patent. Review of the MIP images confirms the above findings CT Brain Perfusion Findings: CBF (<30%) Volume: 55m Perfusion (Tmax>6.0s) volume: 1126mMismatch Volume: 6326mnfarction Location: Right MCA territory IMPRESSION: No acute intracranial hemorrhage. 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 Reconstitution of right MCA at the bifurcation. Right A1 ACA is patent. Anterior communicating artery is present. No posterior communicating artery is identified. Perfusion imaging demonstrates core infarction of 51 mL. This is greater than anticipated by noncontrast CT. Penumbra is 63 mL and therefore may be greater than calculated. Initial results were called by telephone at the time of interpretation on 07/04/2020 at 1:58 pm to provider Dr. BhaCurly Shoresho verbally acknowledged these results. Electronically Signed   By: PraMacy MisD.   On: 07/04/2020 14:10    Assessment/Plan: S/P bilateral common carotid arteriogram and RT Vert arteriogram  followed by revascularization occluded terminal ICA and Rt MCA and RT ACA A1 with x 1 pass with [id number] mm embotrap retriever and aspiration achieving achieving a TICI 2B revascularization. Multiple distal occlusions of sup and inf divisions in the M4 regions noted. Good Retrograde collateral reconstitution from RT P3 collaterals  Of post MCA distribution. Post CT contrast stain=/_ petechial hemorrhage in RT post putamen  MRI today: Large territory right MCA infarct involving basal ganglia, frontal and temporal lobes. Hemorrhagic transformation with mild to moderate hemorrhage in the infarct. There is mass-effect and 6 mm midline shift to left     LOS: 1  day   I spent a total of 10 minutes in face to face in clinical consultation, greater than 50% of which was counseling/coordinating care for CVA post intervention  KeviAscencion DikeC 07/05/2020 10:18 AM

## 2020-07-05 NOTE — Progress Notes (Signed)
  Echocardiogram 2D Echocardiogram has been performed.  Rubi Tooley G Nykira Reddix 07/05/2020, 11:00 AM

## 2020-07-05 NOTE — Progress Notes (Signed)
  Echocardiogram 2D Echocardiogram has been attempted. Nurse requested to come back later. Patient leaving for MRI.  Brandi Myers G Brandi Myers 07/05/2020, 8:22 AM

## 2020-07-05 NOTE — Transfer of Care (Signed)
Immediate Anesthesia Transfer of Care Note  Patient: Brandi Myers  Procedure(s) Performed: RIGHT UPPER EXTREMITY THROMBECTOMY (Right Arm Upper)  Patient Location: NICU  Anesthesia Type:General  Level of Consciousness: Patient remains intubated per anesthesia plan  Airway & Oxygen Therapy: Patient placed on Ventilator (see vital sign flow sheet for setting)  Post-op Assessment: Report given to RN and Post -op Vital signs reviewed and stable  Post vital signs: Reviewed and stable  Last Vitals:  Vitals Value Taken Time  BP 115/70 07/05/20 2144  Temp    Pulse 107 07/05/20 2146  Resp 18 07/05/20 2146  SpO2 100 % 07/05/20 2146  Vitals shown include unvalidated device data.  Last Pain:  Vitals:   07/05/20 1920  TempSrc: Axillary         Complications: No complications documented.

## 2020-07-05 NOTE — Progress Notes (Signed)
Venous lower duplex  has been completed. Refer to [facility] under chart review to view preliminary results.   07/05/2020  1:30 PM Ardenia Stiner, Gerarda Gunther

## 2020-07-05 NOTE — Consult Note (Signed)
Hospital Consult    Reason for Consult:  Ischemic right upper extremity Referring Physician:  Dr. Roda Shutters, neurology MRN #:  [id number]  History of Present Illness: This is a 35 y.o. female with hx HTN, DM, and drug abuse (cocaine) that vascular surgery has been consulted for an ischemic right upper extremity.  Apparently they noticed that her hand had become pale cool and discolored approximately 1.5 hours ago in the neuro ICU where she is intubated.  She had an upper extremity arterial duplex that showed no flow in the distal right brachial artery and no flow in the radial ulnar artery.  She is currently intubated in the neuro ICU after she presented with left-sided deficits yesterday and was a code stroke.  She went to IR yesterday with Dr. Corliss Skains for bilateral common carotid arteriogram, right vert arteriogram, and revascularization of a terminally occluded right ICA/MCA.  Past Medical History:  Diagnosis Date  . Diabetes mellitus without complication (HCC)   . Hypertension     Past Surgical History:  Procedure Laterality Date  . CESAREAN SECTION    . CHOLECYSTECTOMY    . RADIOLOGY WITH ANESTHESIA N/A 07/04/2020   Procedure: IR WITH ANESTHESIA;  Surgeon: Radiologist, Medication, MD;  Location: [facility] OR;  Service: Radiology;  Laterality: N/A;    Allergies  Allergen Reactions  . Norco [Hydrocodone-Acetaminophen] Itching, Nausea And Vomiting and Swelling    Prior to Admission medications   Not on File    Social History   Socioeconomic History  . Marital status: Single    Spouse name: Not on file  . Number of children: Not on file  . Years of education: Not on file  . Highest education level: Not on file  Occupational History  . Occupation: home maker  Tobacco Use  . Smoking status: Current Every Day Smoker    Packs/day: 0.10    Types: Cigarettes  . Smokeless tobacco: Never Used  Substance and Sexual Activity  . Alcohol use: Yes    Comment: occ.  . Drug use: No  .  Sexual activity: Not on file    Comment: tubal ligation  Other Topics Concern  . Not on file  Social History Narrative  . Not on file   Social Determinants of Health   Financial Resource Strain: Not on file  Food Insecurity: Not on file  Transportation Needs: Not on file  Physical Activity: Not on file  Stress: Not on file  Social Connections: Not on file  Intimate Partner Violence: Not on file      Family History  Problem Relation Age of Onset  . Diabetes Maternal Grandmother     ROS: [x]  Positive   [ ]  Negative   [ ]  All sytems reviewed and are negative  Cardiovascular: []  chest pain/pressure []  palpitations []  SOB lying flat []  DOE []  pain in legs while walking []  pain in legs at rest []  pain in legs at night []  non-healing ulcers []  hx of DVT []  swelling in legs  Pulmonary: []  productive cough []  asthma/wheezing []  home O2  Neurologic: []  weakness in []  arms []  legs []  numbness in []  arms []  legs []  hx of CVA []  mini stroke [] difficulty speaking or slurred speech []  temporary loss of vision in one eye []  dizziness  Hematologic: []  hx of cancer []  bleeding problems []  problems with blood clotting easily  Endocrine:   []  diabetes []  thyroid disease  GI []  vomiting blood []  blood in stool  GU: []  CKD/renal failure []   HD--[]  M/W/F or []  T/T/S []  burning with urination []  blood in urine  Psychiatric: []  anxiety []  depression  Musculoskeletal: []  arthritis []  joint pain  Integumentary: []  rashes []  ulcers  Constitutional: []  fever []  chills   Physical Examination  Vitals:   07/05/20 1630 07/05/20 1700  BP: 126/82 138/89  Pulse: (!) 115 (!) 111  Resp: 20 20  Temp:    SpO2: 100% 100%   Body mass index is 26.49 kg/m.  General:  Intubated, critically ill HENT: ET tube in place Pulmonary: course breath sounds bilaterally Cardiac: regular, without  Murmurs, rubs or gallops Abdomen: soft, NT/ND Vascular Exam/Pulses: Left  brachial and radial pulse easily palpable Right brachial pulse palpable in the mid upper arm but no distal brachial pulse and no pulses at the wrist and no radial or ulnar signals at the right wrist with delayed capillary refill Bilateral DP is palpable and lower extremities well perfused Extremities: with ischemic changes right hand Musculoskeletal: no muscle wasting or atrophy  Neurologic: Intubated, difficult to assess right hand motor or sensory deficit   CBC    Component Value Date/Time   WBC 14.2 (H) 07/05/2020 0541   RBC 4.62 07/05/2020 0541   HGB 8.6 (L) 07/05/2020 0541   HGB 11.4 (L) 10/22/2012 1800   HCT 32.0 (L) 07/05/2020 0541   HCT 31.4 (L) 10/23/2012 0518   PLT 160 07/05/2020 0541   PLT 191 10/22/2012 1800   MCV 69.3 (L) 07/05/2020 0541   MCV 87 10/22/2012 1800   MCH 18.6 (L) 07/05/2020 0541   MCHC 26.9 (L) 07/05/2020 0541   RDW 21.8 (H) 07/05/2020 0541   RDW 14.3 10/22/2012 1800   LYMPHSABS 1.4 07/05/2020 0541   LYMPHSABS 2.4 10/22/2012 1800   MONOABS 0.1 07/05/2020 0541   MONOABS 1.0 (H) 10/22/2012 1800   EOSABS 0.1 07/05/2020 0541   EOSABS 0.1 10/22/2012 1800   BASOSABS 0.0 07/05/2020 0541   BASOSABS 0.1 10/22/2012 1800    BMET    Component Value Date/Time   NA 141 07/05/2020 0541   K 3.1 (L) 07/05/2020 0541   CL 107 07/05/2020 0541   CO2 25 07/05/2020 0541   GLUCOSE 147 (H) 07/05/2020 0541   BUN <5 (L) 07/05/2020 0541   CREATININE 0.61 07/05/2020 0541   CALCIUM 8.1 (L) 07/05/2020 0541   GFRNONAA >60 07/05/2020 0541   GFRAA >60 01/04/2018 2056    COAGS: Lab Results  Component Value Date   INR 1.2 07/04/2020     Non-Invasive Vascular Imaging:    Right upper extremity arterial duplex shows an occluded distal brachial artery with no flow in the radial or ulnar artery.   ASSESSMENT/PLAN: This is a 35 y.o. female the presented yesterday as a code stroke that now presents with acutely ischemic right upper extremity.  I have reviewed her upper  extremity arterial duplex and she appears to have occluded distal right brachial artery with occlusion in the right radial and ulnar artery.  Due to hemorrhagic conversion of her stroke, I have discussed with Dr. 07/07/2020 neurology who has recommended only intra-arterial heparin directly in the artery in the OR but no systemic IV heparin.  They will plan repeat head CT after the OR to make decision about anticoagulation later.  I have discussed with the patient's father on the phone and he wants to proceed.  We talked about this being a limb threatening situation.  Transthoracic echo did not show any thrombus but this certainly raises question of cardioembolic or other  central source etc.  Cephus Shelling, MD [facility] [place] Office: [phone]  Cephus Shelling

## 2020-07-05 NOTE — TOC CAGE-AID Note (Signed)
Transition of Care [facility]) - CAGE-AID Screening   Patient Details  Name: Brandi Myers MRN: [id number] Date of Birth: 21-Aug-1985  [facility] of [facility]) CM/SW Contact:    Mearl Latin, LCSW Phone Number: 07/05/2020, 9:41 AM   Clinical Narrative: Patient intubated and unable to participate in screening.    CAGE-AID Screening: Substance Abuse Screening unable to be completed due to: : Patient unable to participate

## 2020-07-05 NOTE — Op Note (Addendum)
Date: July 05, 2020  Preoperative diagnosis: Ischemic right upper extremity  Postoperative diagnosis: Same  Procedure: Right upper extremity thrombectomy including the right radial, ulnar, and brachial artery  Surgeon: Dr. Cephus Shelling, MD  Assistant: Aggie Moats, PA  Indications: Patient is a 35 year old female that was noted to have a pale hand this evening after presenting yesterday as a code stroke.  Ultimately she went to IR yesterday and had a right distal ICA/MCA thrombectomy and was intubated in the neuro ICU at time of initial evaluation.  Right upper extremity arterial duplex showed thrombus in the distal brachial artery as well as the radial and ulnar artery.  She presents emergently after risk benefits discussed with her father.  An assistant was needed for exposure and to expedite the case.  Findings: Longitudinal incision was made just below the antecubital crease in the right arm and the distal brachial artery as well as the bifurcation was dissected out.  When I opened the brachial artery there was both well organized subacute and acute appearing thrombus in the brachial artery and we were able to pass a #3 Fogarty proximally and get good pulsatile inflow.  We had no backbleeding from the radial or ulnar artery and ultimately I was able to get a catheter down to the wrist in the radial artery but it would not advance in the ulnar artery past 10 cm.  Ultimately I closed at the arteriotomy and we had no signal in the ulnar artery or in the hand.  I then reopened the arteriotomy was able to get a #3 Fogarty to go to the wrist in the ulnar artery and retrieved additional long plug of thrombus.  At completion we had radial and ulnar signals.  Anesthesia: Transported from the ICU intubated and general anesthesia  Details: Patient was taken to the operating room after informed consent was obtained from family.  Placed on the operative table supine position.  She was  already intubated and once general anesthesia was established we evaluated his right arm with ultrasound and the brachial artery was marked below the elbow as well as the bifurcation of the brachial artery where it bifurcated to the radial and ulnar artery.  Ultimately the right arm was then prepped and draped in usual sterile fashion.  A timeout was performed. She was given preoperative antibiotics.  Made a longitudinal incision over the brachial artery below the elbow and dissected down opened the fascia and exposed the brachial artery as well as dissected out the radial and ulnar artery that were all controlled Vesseloops.  Neurology did not want her to get systemic heparin.  I then opened the brachial artery just above the bifurcation with transverse arteriotomy with 11 blade scalpel extended the arteriotomy with Potts scissors.  I passed a #3 Fogarty proximally several times getting acute appearing thrombus on each pass until I finally had a clean pass and got pulsatile inflow.  I had no back bleeding from the radial or ulnar artery.  I was able to get a #2 Fogarty to advance all the way to the wrist in the radial artery and got multiple long plugs of additional thrombus.  I could not get the fogarty to advance very far in the ulnar artery.  We then closed at the arteriotomy with 6-0 Prolene's in interrupted fashion.  Once we came off clamps we did de-air everything prior to completion and all the arteries had been flushed with heparinized saline.  We had a good radial signal but  I did not have an ulnar signal or a palmar arch signal.  Given these concerning findings, I elected to reopen the arteriotomy with 11 blade scalpel.  I flushed the radial, ulnar, and brachial artery with heparinzed saline again.  I was able to get a #3 Fogarty down the ulnar artery to the wrist and got an additional long plug of thrombus.  We then had good backbleeding.  The arteriotomy was repaired with 6-0 Prolene in interrupted  fashion.  We closed the incision with 3-0 Vicryl 4-0 Monocryl and Dermabond.  Complication: None  Condition: Critical  Cephus Shelling, MD [facility] [place] Office: [phone]   Cephus Shelling

## 2020-07-05 NOTE — Progress Notes (Signed)
OT Cancellation Note  Patient Details Name: Brandi Myers MRN: [id number] DOB: October 13, 1985   Cancelled Treatment:    Reason Eval/Treat Not Completed: Active bedrest order;Medical issues which prohibited therapy (Intubated and awaiting MRI.)  Vicente Weidler M Carigan Lister Bernie Ransford MSOT, OTR/L Acute Rehab Pager: [phone] Office: [phone] 07/05/2020, 7:57 AM

## 2020-07-05 NOTE — Progress Notes (Signed)
PT Cancellation Note  Patient Details Name: Brandi Myers MRN: [id number] DOB: [birth year]   Cancelled Treatment:    Reason Eval/Treat Not Completed: Active bedrest order  this morning. PT will continue to follow and evaluate as appropriate.   Rolm Baptise, PT, DPT   Acute Rehabilitation Department Pager #: [phone]   [name] 07/05/2020, 7:48 AM

## 2020-07-05 NOTE — Progress Notes (Signed)
Patient transported on vent to MRI and returned to 4N22 without complications.

## 2020-07-05 NOTE — CV Procedure (Signed)
RUE arterial duplex completed. Ladean Raya, RN and Dr. Roda Shutters given critical results at 1810.  Results can be found under chart review under CV PROC. 07/05/2020 6:29 PM Brandi Myers RVT, RDMS

## 2020-07-05 NOTE — Progress Notes (Signed)
SLP Cancellation Note  Patient Details Name: Brandi Myers MRN: [id number] DOB: April 14, 1986   Cancelled treatment:       Reason Eval/Treat Not Completed: Patient not medically ready. Will follow for readiness.    DeBlois, Riley Nearing 07/05/2020, 8:08 AM

## 2020-07-05 NOTE — Progress Notes (Signed)
On-call neurology note Repeat CT head with worsening midline shift and hemorrhagic transformation. Hold off heparin for now Start 3% saline at 75 cc/h after bolus to 50 cc.  Goal sodium 150-155 Plan on discussing decompressive hemicraniectomy with neurosurgery in the morning Repeat head CT at 0600 Plan discussed with Dr. Roda Shutters.  -- Milon Dikes, MD Neurologist [facility] Pager: [phone]

## 2020-07-05 NOTE — Progress Notes (Signed)
Met patient father Jeneen Rinks and uncle Sonia Side at the bedside.  They stated that patient sister died 3 months ago due to drug abuse.  They do not believe patient using IV drug but stated that patient using a large amount of THC regularly.  On exam, patient still intubated and on sedation.  Not open eyes on voice, not following commands.  With forced eye opening, eyes midposition, pupil 2.5 mm, sluggish light reflexes, weak corneal reflex bilaterally, breathing over the vent.  Slight movement of right upper and lower extremity on painful stimuli.  However, found to have right hand and forearm coldness and pale with distal cyanosis.  Had bedside Doppler showed weak pulses, brachial pulses and elbow palpable.  Had a stat right upper extremity artery Doppler showed absence of flow beyond distal brachial artery.  Consulted vascular surgery Dr. Carlis Abbott, plan to have RUE thrombectomy with local heparin in OR, then have stat head CT to rule out frank hematoma for hemorrhagic conversion, then may consider systemic heparin IV for further treatment. I discussed with pt father Jeneen Rinks over the phone and he is in agreement. Dr. Carlis Abbott will also call him for consent.   Rosalin Hawking, MD PhD Stroke Neurology 07/05/2020 7:00 PM  This patient is critically ill due to acute right upper extremity brachial artery occlusion and at significant risk of neurological worsening, death form ischemic limb. This patient's care requires constant monitoring of vital signs, hemodynamics, respiratory and cardiac monitoring, review of multiple databases, neurological assessment, discussion with family, other specialists and medical decision making of high complexity. I spent 30 minutes of neurocritical care time in the care of this patient. I had long discussion with father at bedside, updated pt current condition, treatment plan and potential prognosis, and answered all the questions.  He expressed understanding.  I also discussed with Dr. Carlis Abbott.

## 2020-07-05 NOTE — Progress Notes (Addendum)
STROKE TEAM PROGRESS NOTE   INTERVAL HISTORY She is sedated and intubated. Per chart review she was LKW at 11:30 AM, she went to take a shower and was found down at 12:15 with Left sided weakness and slurred speech. On presentation to the [facility] ED she was given tPA and transferred to [facility]. She then underwent IR intervention.   IR and CCM are following.  Vitals:   07/05/20 0600 07/05/20 0700 07/05/20 0800 07/05/20 0802  BP: 134/90 135/89    Pulse: (!) 116 (!) 120    Resp: 19 18    Temp:   98.7 F (37.1 C)   TempSrc:   Axillary   SpO2: 99% 99%  99%  Weight:      Height:       CBC:  Recent Labs  Lab 07/04/20 1247 07/04/20 1844 07/05/20 0541  WBC 17.8*  --  14.2*  NEUTROABS 12.7*  --  12.5*  HGB 9.5* 9.9* 8.6*  HCT 35.0* 29.0* 32.0*  MCV 67.2*  --  69.3*  PLT 235  --  160   Basic Metabolic Panel:  Recent Labs  Lab 07/04/20 1337 07/04/20 1844 07/04/20 2304 07/05/20 0541  NA 133* 140  --  141  K 2.9* 3.1* 3.6 3.1*  CL 99  --   --  107  CO2 21*  --   --  25  GLUCOSE 448*  --   --  147*  BUN <5*  --   --  <5*  CREATININE 0.59  --   --  0.61  CALCIUM 8.3*  --   --  8.1*   Lipid Panel:  Recent Labs  Lab 07/05/20 0541  CHOL 154  TRIG 79   86  HDL 39*  CHOLHDL 3.9  VLDL 16  LDLCALC 99   HgbA1c:  Recent Labs  Lab 07/04/20 1814  HGBA1C 10.6*   Urine Drug Screen:  Recent Labs  Lab 07/05/20 0010  LABOPIA NONE DETECTED  COCAINSCRNUR POSITIVE*  LABBENZ NONE DETECTED  AMPHETMU NONE DETECTED  THCU POSITIVE*  LABBARB NONE DETECTED    Alcohol Level  Recent Labs  Lab 07/04/20 1247  ETH <10    IMAGING past 24 hours CT Angio Head W/Cm &/Or Wo Cm  Result Date: 07/04/2020 CLINICAL DATA:  Left-sided weakness and slurred speech EXAM: CT HEAD WITHOUT CONTRAST CT ANGIOGRAPHY HEAD AND NECK CT PERFUSION BRAIN TECHNIQUE: Multi detector CT imaging of the head was performed using the standard protocol without intravenous contrast. Multidetector CT imaging of the  head and neck was performed using the standard protocol during bolus administration of intravenous contrast. Multiplanar CT image reconstructions and MIPs were obtained to evaluate the vascular anatomy. Carotid stenosis measurements (when applicable) are obtained utilizing NASCET criteria, using the distal internal carotid diameter as the denominator. Multiphase CT imaging of the brain was performed following IV bolus contrast injection. Subsequent parametric perfusion maps were calculated using RAPID software. CONTRAST:  OMNIPAQUE IOHEXOL 350 MG/ML SOLN COMPARISON:  None. FINDINGS: CT HEAD FINDINGS Brain: There is no acute intracranial hemorrhage or mass effect. Loss of gray-white differentiation involving a portion of the right lentiform nucleus. Ventricles and sulci are normal in size and configuration. No extra-axial collection. Vascular: No hyperdense vessel. Skull: Unremarkable. Sinuses/Orbits: Lobular maxillary sinus mucosal thickening. Orbits are unremarkable. Other: Minor patchy left mastoid tip opacification. ASPECTS [facility] Stroke Program Early CT Score) - Ganglionic level infarction (caudate, lentiform nuclei, internal capsule, insula, M1-M3 cortex): 6 - Supraganglionic infarction (M4-M6 cortex): 3 Total score (  0-10 with 10 being normal): 9 CTA NECK FINDINGS Aortic arch: Normal caliber aorta.  Great vessel origins are patent. Right carotid system: Common carotid is patent. There is thrombus at the bifurcation extending into the external carotid. Less than 50% stenosis at the ICA origin. The cervical ICA is relatively smaller in caliber. Left carotid system: Patent.  No stenosis. Vertebral arteries: Patent. Right vertebral artery slightly dominant. No stenosis. Skeleton: Unremarkable. Other neck: Thyroid not well evaluated due to streak artifact. Likely reactive cervical lymph nodes. Upper chest: Included lungs are clear. Endotracheal tube is present. Review of the MIP images confirms the above  findings CTA HEAD FINDINGS Anterior circulation: Proximal intracranial internal carotid artery is patent. There is loss of enhancement at the level of the supraclinoid portion. Faint enhancement is seen along the right M1 MCA. There is reconstitution of M2 branches. Patent right A1 ACA. Anterior communicating artery is present. Left anterior and middle cerebral arteries are patent. Posterior circulation: Intracranial vertebral arteries, basilar artery, and posterior cerebral arteries are patent. Venous sinuses: As permitted by contrast timing, patent. Review of the MIP images confirms the above findings CT Brain Perfusion Findings: CBF (<30%) Volume: 41mL Perfusion (Tmax>6.0s) volume: Mismatch Volume: 31mL Infarction Location: Right MCA territory IMPRESSION: No acute intracranial hemorrhage. 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 Reconstitution of right MCA at the bifurcation. Right A1 ACA is patent. Anterior communicating artery is present. No posterior communicating artery is identified. Perfusion imaging demonstrates core infarction of 51 mL. This is greater than anticipated by noncontrast CT. Penumbra is 63 mL and therefore may be greater than calculated. Initial results were called by telephone at the time of interpretation on 07/04/2020 at 1:58 pm to provider Dr. Iver Nestle, who verbally acknowledged these results. Electronically Signed   By: Guadlupe Spanish M.D.   On: 07/04/2020 14:10   DG Chest 1 View  Result Date: 07/04/2020 CLINICAL DATA:  Intubation EXAM: CHEST  1 VIEW COMPARISON:  01/12/2016 FINDINGS: Endotracheal tube is 3.6 cm above the carina. NG tube is in the stomach. No confluent opacities or effusions. Heart is normal size. No acute bony  abnormality. IMPRESSION: Support devices in expected position. No acute cardiopulmonary disease. Electronically Signed   By: Charlett Nose M.D.   On: 07/04/2020 15:09   CT Angio Neck W and/or Wo Contrast  Result Date: 07/04/2020 CLINICAL DATA:  Left-sided weakness and slurred speech EXAM: CT HEAD WITHOUT CONTRAST CT ANGIOGRAPHY HEAD AND NECK CT PERFUSION BRAIN TECHNIQUE: Multi detector CT imaging of the head was performed using the standard protocol without intravenous contrast. Multidetector CT imaging of the head and neck was performed using the standard protocol during bolus administration of intravenous contrast. Multiplanar CT image reconstructions and MIPs were obtained to evaluate the vascular anatomy. Carotid stenosis measurements (when applicable) are obtained utilizing NASCET criteria, using the distal internal carotid diameter as the denominator. Multiphase CT imaging of the brain was performed following IV bolus contrast injection. Subsequent parametric perfusion maps were calculated using RAPID software. CONTRAST:  OMNIPAQUE IOHEXOL 350 MG/ML SOLN COMPARISON:  None. FINDINGS: CT HEAD FINDINGS Brain: There is no acute intracranial hemorrhage or mass effect. Loss of gray-white differentiation involving a portion of the right lentiform nucleus. Ventricles and sulci are normal in size and configuration. No extra-axial collection.  Vascular: No hyperdense vessel. Skull: Unremarkable. Sinuses/Orbits: Lobular maxillary sinus mucosal thickening. Orbits are unremarkable. Other: Minor patchy left mastoid tip opacification. ASPECTS [facility] Stroke Program Early CT Score) - Ganglionic level infarction (caudate, lentiform nuclei, internal capsule, insula, M1-M3 cortex): 6 - Supraganglionic infarction (M4-M6 cortex): 3 Total score (0-10 with 10 being normal): 9 CTA NECK FINDINGS Aortic arch: Normal caliber aorta.  Great vessel origins are patent. Right carotid system: Common carotid is patent. There is  thrombus at the bifurcation extending into the external carotid. Less than 50% stenosis at the ICA origin. The cervical ICA is relatively smaller in caliber. Left carotid system: Patent.  No stenosis. Vertebral arteries: Patent. Right vertebral artery slightly dominant. No stenosis. Skeleton: Unremarkable. Other neck: Thyroid not well evaluated due to streak artifact. Likely reactive cervical lymph nodes. Upper chest: Included lungs are clear. Endotracheal tube is present. Review of the MIP images confirms the above findings CTA HEAD FINDINGS Anterior circulation: Proximal intracranial internal carotid artery is patent. There is loss of enhancement at the level of the supraclinoid portion. Faint enhancement is seen along the right M1 MCA. There is reconstitution of M2 branches. Patent right A1 ACA. Anterior communicating artery is present. Left anterior and middle cerebral arteries are patent. Posterior circulation: Intracranial vertebral arteries, basilar artery, and posterior cerebral arteries are patent. Venous sinuses: As permitted by contrast timing, patent. Review of the MIP images confirms the above findings CT Brain Perfusion Findings: CBF (<30%) Volume: 51mL Perfusion (Tmax>6.0s) volume: Mismatch Volume: 63mL Infarction Location: Right MCA territory IMPRESSION: No acute intracranial hemorrhage. 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 Reconstitution of right MCA at the bifurcation. Right A1 ACA is patent. Anterior communicating artery is present. No posterior communicating artery is identified. Perfusion imaging demonstrates core infarction of 51 mL. This is greater than anticipated by noncontrast CT. Penumbra is 63 mL and therefore may be greater  than calculated. Initial results were called by telephone at the time of interpretation on 07/04/2020 at 1:58 pm to provider Dr. Iver Nestle, who verbally acknowledged these results. Electronically Signed   By: Guadlupe Spanish M.D.   On: 07/04/2020 14:10   CT CEREBRAL PERFUSION W CONTRAST  Result Date: 07/04/2020 CLINICAL DATA:  Left-sided weakness and slurred speech EXAM: CT HEAD WITHOUT CONTRAST CT ANGIOGRAPHY HEAD AND NECK CT PERFUSION BRAIN TECHNIQUE: Multi detector CT imaging of the head was performed using the standard protocol without intravenous contrast. Multidetector CT imaging of the head and neck was performed using the standard protocol during bolus administration of intravenous contrast. Multiplanar CT image reconstructions and MIPs were obtained to evaluate the vascular anatomy. Carotid stenosis measurements (when applicable) are obtained utilizing NASCET criteria, using the distal internal carotid diameter as the denominator. Multiphase CT imaging of the brain was performed following IV bolus contrast injection. Subsequent parametric perfusion maps were calculated using RAPID software. CONTRAST:  OMNIPAQUE IOHEXOL 350 MG/ML SOLN COMPARISON:  None. FINDINGS: CT HEAD FINDINGS Brain: There is no acute intracranial hemorrhage or mass effect. Loss of gray-white differentiation involving a portion of the right lentiform nucleus. Ventricles and sulci are normal in size and configuration. No extra-axial collection. Vascular: No hyperdense vessel. Skull: Unremarkable. Sinuses/Orbits: Lobular maxillary sinus mucosal thickening. Orbits are unremarkable. Other: Minor patchy left mastoid tip opacification. ASPECTS [facility] Stroke Program  Early CT Score) - Ganglionic level infarction (caudate, lentiform nuclei, internal capsule, insula, M1-M3 cortex): 6 - Supraganglionic infarction (M4-M6 cortex): 3 Total score (0-10 with 10 being normal): 9 CTA NECK FINDINGS Aortic arch: Normal caliber aorta.  Great vessel  origins are patent. Right carotid system: Common carotid is patent. There is thrombus at the bifurcation extending into the external carotid. Less than 50% stenosis at the ICA origin. The cervical ICA is relatively smaller in caliber. Left carotid system: Patent.  No stenosis. Vertebral arteries: Patent. Right vertebral artery slightly dominant. No stenosis. Skeleton: Unremarkable. Other neck: Thyroid not well evaluated due to streak artifact. Likely reactive cervical lymph nodes. Upper chest: Included lungs are clear. Endotracheal tube is present. Review of the MIP images confirms the above findings CTA HEAD FINDINGS Anterior circulation: Proximal intracranial internal carotid artery is patent. There is loss of enhancement at the level of the supraclinoid portion. Faint enhancement is seen along the right M1 MCA. There is reconstitution of M2 branches. Patent right A1 ACA. Anterior communicating artery is present. Left anterior and middle cerebral arteries are patent. Posterior circulation: Intracranial vertebral arteries, basilar artery, and posterior cerebral arteries are patent. Venous sinuses: As permitted by contrast timing, patent. Review of the MIP images confirms the above findings CT Brain Perfusion Findings: CBF (<30%) Volume: 55mL Perfusion (Tmax>6.0s) volume: Mismatch Volume: 15mL Infarction Location: Right MCA territory IMPRESSION: No acute intracranial hemorrhage. 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 Reconstitution of right MCA at the bifurcation. Right A1 ACA is patent. Anterior communicating artery is present. No posterior communicating artery is identified. Perfusion imaging demonstrates core infarction of 51 mL. This is greater than  anticipated by noncontrast CT. Penumbra is 63 mL and therefore may be greater than calculated. Initial results were called by telephone at the time of interpretation on 07/04/2020 at 1:58 pm to provider Dr. Iver Nestle, who verbally acknowledged these results. Electronically Signed   By: Guadlupe Spanish M.D.   On: 07/04/2020 14:10   DG Abd Portable 1 View  Result Date: 07/04/2020 CLINICAL DATA:  OG tube placement. EXAM: PORTABLE ABDOMEN - 1 VIEW COMPARISON:  No prior. FINDINGS: OG tube noted with tip over the stomach. No gastric or bowel distention noted. Contrast noted both kidneys. Degenerative change lumbar spine. Surgical clips right upper quadrant. IMPRESSION: OG tube noted with tip over the stomach. No gastric or bowel distention noted. Electronically Signed   By: Maisie Fus  Register   On: 07/04/2020 15:13   CT HEAD CODE STROKE WO CONTRAST  Result Date: 07/04/2020 CLINICAL DATA:  Left-sided weakness and slurred speech EXAM: CT HEAD WITHOUT CONTRAST CT ANGIOGRAPHY HEAD AND NECK CT PERFUSION BRAIN TECHNIQUE: Multi detector CT imaging of the head was performed using the standard protocol without intravenous contrast. Multidetector CT imaging of the head and neck was performed using the standard protocol during bolus administration of intravenous contrast. Multiplanar CT image reconstructions and MIPs were obtained to evaluate the vascular anatomy. Carotid stenosis measurements (when applicable) are obtained utilizing NASCET criteria, using the distal internal carotid diameter as the denominator. Multiphase CT imaging of the brain was performed following IV bolus contrast injection. Subsequent parametric perfusion maps were calculated using RAPID software. CONTRAST:  OMNIPAQUE IOHEXOL 350 MG/ML SOLN COMPARISON:  None. FINDINGS: CT HEAD FINDINGS Brain: There  is no acute intracranial hemorrhage or mass effect. Loss of gray-white differentiation involving a portion of the right lentiform nucleus. Ventricles and  sulci are normal in size and configuration. No extra-axial collection. Vascular: No hyperdense vessel. Skull: Unremarkable. Sinuses/Orbits: Lobular maxillary sinus mucosal thickening. Orbits are unremarkable. Other: Minor patchy left mastoid tip opacification. ASPECTS [facility] Stroke Program Early CT Score) - Ganglionic level infarction (caudate, lentiform nuclei, internal capsule, insula, M1-M3 cortex): 6 - Supraganglionic infarction (M4-M6 cortex): 3 Total score (0-10 with 10 being normal): 9 CTA NECK FINDINGS Aortic arch: Normal caliber aorta.  Great vessel origins are patent. Right carotid system: Common carotid is patent. There is thrombus at the bifurcation extending into the external carotid. Less than 50% stenosis at the ICA origin. The cervical ICA is relatively smaller in caliber. Left carotid system: Patent.  No stenosis. Vertebral arteries: Patent. Right vertebral artery slightly dominant. No stenosis. Skeleton: Unremarkable. Other neck: Thyroid not well evaluated due to streak artifact. Likely reactive cervical lymph nodes. Upper chest: Included lungs are clear. Endotracheal tube is present. Review of the MIP images confirms the above findings CTA HEAD FINDINGS Anterior circulation: Proximal intracranial internal carotid artery is patent. There is loss of enhancement at the level of the supraclinoid portion. Faint enhancement is seen along the right M1 MCA. There is reconstitution of M2 branches. Patent right A1 ACA. Anterior communicating artery is present. Left anterior and middle cerebral arteries are patent. Posterior circulation: Intracranial vertebral arteries, basilar artery, and posterior cerebral arteries are patent. Venous sinuses: As permitted by contrast timing, patent. Review of the MIP images confirms the above findings CT Brain Perfusion Findings: CBF (<30%) Volume: 51mL Perfusion (Tmax>6.0s) volume: 114mL Mismatch Volume: 63mL Infarction Location: Right MCA territory IMPRESSION: No acute  intracranial hemorrhage. 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 Reconstitution of right MCA at the bifurcation. Right A1 ACA is patent. Anterior communicating artery is present. No posterior communicating artery is identified. Perfusion imaging demonstrates core infarction of 51 mL. This is greater than anticipated by noncontrast CT. Penumbra is 63 mL and therefore may be greater than calculated. Initial results were called by telephone at the time of interpretation on 07/04/2020 at 1:58 pm to provider Dr. Iver NestleBhagat, who verbally acknowledged these results. Electronically Signed   By: Guadlupe SpanishPraneil  Patel M.D.   On: 07/04/2020 14:10    PHYSICAL EXAM Blood pressure 135/89, pulse (!) 120, temperature 98.7 F (37.1 C), temperature source Axillary, resp. rate 18, height 5\' 5"  (1.651 m), weight 72.2 kg, SpO2 99 %.  General: sedated and intubated, caucasian female, no apparent distress  Lungs: Symmetrical Chest rise, no labored breathing  Cardio: Regular Rate and Rhythm  Abdomen: Soft, non-tender  Neuro: Sedated and intubated. Pupils are small and sluggish to light  Motor: Moves RUE and RLE briskly to noxious stimuli. LUE and LLE have no movement Tone and bulk:normal tone throughout; no atrophy noted   ASSESSMENT/PLAN Ms. Brandi MaizesJessica L Myers is a 35 y.o. female with history of uncontrolled diabetes and hypertension presenting with left sided weakness and slurred speech. Per family there is a complicated social situation at this time.  She lives intermittently in an apartment by herself but was kicked out and they went to pick her up at 5:30 AM.  At that point she seemed "off" and  generally quiet but did not seem to have any difficulty with left-sided  weakness. Family attributed this to psychosocial issues and potential substance use noting that her sister passed away from an overdose in October.  Later in the morning she complained of diffuse body pain and was administered gabapentin and a muscle relaxer. At 11:30 AM she stood up and walked to the shower and at 1215 she was found on the bottom of the tub with slurred speech and difficulty moving the left side. She was not preactivated by EMS, but on arrival in the ED clearly appear to have a doorway examination consistent with a right MCA territory infarct. 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 CT perfusion imaging demonstrated possible core infarct of 51 mL (though notably this was obtained within 2 hours of symptom onset and therefore may reflect an overestimate of the true core). Mismatch ratio was 2 with a penumbra of 63 mL.  Case was discussed with Dr. Corliss Skains of neuro interventional radiology and Dr. Otelia Limes of [facility] and the patient was accepted for transfer to [facility] for emergent intervention. She was also started on TPA after consenting the patient's emergency contact with full discussion of risks of bleeding and importance of accurately determining the patient's last known well. Ms. Marikay Alar confirm the last known well as described above and agreed that the patient would want TPA given the risk/benefit.  She confirmed that the patient had no recent procedures or other contraindications to TPA. Regarding her baseline, she is functional at baseline and family believes she is smoking at least 1 pack a day currently.  She is on Metformin for her diabetes, does not take oral contraceptive pills, has had no recent surgeries or procedures, has not had any complaints of bleeding and family believes that she would have informed them of any significant bleeding.  She  has had Hemorrhagic conversion of her stroke. On MRA she already has some midline shift. Will obtain repeat CT head tomorrow morning to monitor further midline shift. May need to consult NeuroSurgery if midline shift worsens. She does have low TSH and high Free T4 but could be caused by current medical condition will recheck on Saturday.  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 Reconstitution of right MCA at the bifurcation. Right A1 ACA is patent. Anterior communicating artery is present. No posterior communicating artery is identified. Perfusion imaging demonstrates core infarction of 51 mL. This is greater than anticipated by noncontrast CT. Penumbra is 63 mL and therefore may be greater than calculated.  CTA head & neck - No acute intracranial hemorrhage. 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 Reconstitution of right MCA at the bifurcation. Right A1 ACA is patent. Anterior communicating artery is present. No posterior communicating artery  is identified. Perfusion imaging demonstrates core infarction of 51 mL. This is greater than anticipated by noncontrast CT. Penumbra is 63 mL and therefore may be greater than calculated.  CT perfusion - No acute intracranial hemorrhage. Acute  infarction involving the right lentiform nucleus (ASPECT score 9). 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 Reconstitution of right MCA at the bifurcation. Right A1 ACA is patent. Anterior communicating artery is present. No posterior communicating artery is identified. Perfusion imaging demonstrates core infarction of 51 mL. This is greater than anticipated by noncontrast CT. Penumbra is 63 mL and therefore may be greater than calculated.  MRA - Large territory right MCA infarct involving basal ganglia, frontal and temporal lobes. Hemorrhagic transformation with mild to moderate hemorrhage in the infarct. There is mass-effect and 6 mm midline shift to left. Negative MRA head  2D Echo - EF: 60-65%. No wall motion abnormality.  DVT US - No evidence of DVT bilaterally  LDL 99  HgbA1c 10.6  VTE prophylaxis - SCD's    Diet   Diet NPO time specified     No antithrombotic prior to admission, now on No antithrombotic due to active Hemorrhagic conversion will not start at this time.  Therapy recommendations:  Pending  Disposition:  Pending  Hypertension  Home meds: None  Stable  Currently on Cleviprex  Permissive hypertension (OK if < 220/120) but gradually normalize in 5-7 days  Long-term BP goal normotensive  Hyperlipidemia  Home meds:  none  LDL 99, goal < 70  Will not start Statin today given her active conversion.  Continue statin at discharge  Diabetes type II Uncontrolled  Home meds:  Metformin, Glargine, Aspart  HgbA1c 10.6, goal < 7.0  CBGs Recent Labs    07/04/20 2332 07/05/20 0428 07/05/20 0744  GLUCAP 124* 139* 156*   156*      SSI  Other Stroke Risk Factors  Cigarette smoker will advise to stop smoking  Substance abuse - UDS:  THC POSITIVE, Cocaine POSITIVE.  Will advise patient to stop using due to stroke risk.   Other Active Problems  Hyperthyroid - TSH: 0.307 (Low)  Free T4:1.27 (high)  Redraw TSH in 2 days  Hospital day # 1  Arna Snipe MD Resident   ATTENDING NOTE: I reviewed above note and agree with the assessment and plan. Pt was seen and examined.   35 year old female with history of hypertension, type 1 diabetes, substance abuse including cocaine and THC admitted for left-sided weakness and slurred speech.  CT no acute finding but suspicious for right MCA early ischemic changes.  Status post TPA.  CTA head and neck showed right ICA terminal and proximal MCA occlusion, right CCA nonocclusive thrombus.  CT perfusion showed large penumbra.  Status post thrombectomy with right terminal ICA, right PCA and right ACA TICI2b reperfusion however does have distal micro emboli.  MRI showed large right MCA infarct with 6mm MLS and mild/petechial hemorrhagic transformation.  MRA showed patent intracranial vessels.  EF 60 to 65%, no vegetations.  LE venous Doppler no DVT.  A1c 10.6, LDL 99, UDS showed positive for cocaine and THC.  Hypercoagulable work-up pending.  She does have leukocytosis, WBC 17.8-> 14.2.  UA negative.  General - Well nourished, well developed, intubated on sedation.  Ophthalmologic - fundi not visualized due to noncooperation.  Cardiovascular - Regular rhythm but  tachycardia.  Neuro - intubated on sedation, eyes closed, not following commands. With forced eye opening, eyes in mid position, not blinking to visual threat, doll's eyes sluggish, not tracking, bilateral pupil 2.5 mm, very sluggish to light. Corneal reflex weakly present, gag and cough present. Breathing over the vent.  Facial symmetry not able to test due to ET tube.  Tongue protrusion not cooperative. On pain stimulation, no movement on the left upper and lower extremity, mild withdraw on right upper but briskly withdraw on the right lower extremity.  Positive  bilateral babinski. Sensation, coordination and gait not tested.  Etiology for patient stroke not quite clear, however likely through carotid thrombus seen on CTA with nonconclusive CTA clot.  Source of the clot still undergoing further work-up.  Given large right MCA stroke with hemorrhagic conversion and midline shift, need repeat CT in a.m. to determine whether hypertonic saline or hemicraniotomy is needed.  Given hemorrhagic conversion, may consider only aspirin after 24 hours post TPA.  Continue Crestor.  Appreciate CCM assistance at this time.  Marvel Plan, MD PhD Stroke Neurology 07/05/2020 6:32 PM  This patient is critically ill due to large right MCA stroke, hemorrhagic conversion, cerebral edema, uncontrolled diabetes, cocaine use and at significant risk of neurological worsening, death form brain herniation, hemorrhagic transformation, DKA, seizure. This patient's care requires constant monitoring of vital signs, hemodynamics, respiratory and cardiac monitoring, review of multiple databases, neurological assessment, discussion with family, other specialists and medical decision making of high complexity. I spent 40 minutes of neurocritical care time in the care of this patient.     To contact Stroke Continuity provider, please refer to [email or web address]. After hours, contact General Neurology

## 2020-07-06 ENCOUNTER — Inpatient Hospital Stay (HOSPITAL_COMMUNITY): Payer: Medicaid Other | Admitting: Anesthesiology

## 2020-07-06 ENCOUNTER — Inpatient Hospital Stay (HOSPITAL_COMMUNITY): Payer: Medicaid Other

## 2020-07-06 ENCOUNTER — Inpatient Hospital Stay: Payer: Self-pay

## 2020-07-06 ENCOUNTER — Encounter (HOSPITAL_COMMUNITY)
Admission: EM | Disposition: A | Discharge status: Skilled Nursing Facility | Payer: Self-pay | Source: Other Acute Inpatient Hospital | Attending: Neurology

## 2020-07-06 DIAGNOSIS — I6601 Occlusion and stenosis of right middle cerebral artery: Secondary | ICD-10-CM | POA: Diagnosis not present

## 2020-07-06 DIAGNOSIS — G936 Cerebral edema: Secondary | ICD-10-CM | POA: Diagnosis not present

## 2020-07-06 DIAGNOSIS — F1410 Cocaine abuse, uncomplicated: Secondary | ICD-10-CM | POA: Diagnosis not present

## 2020-07-06 DIAGNOSIS — F17200 Nicotine dependence, unspecified, uncomplicated: Secondary | ICD-10-CM

## 2020-07-06 DIAGNOSIS — G911 Obstructive hydrocephalus: Secondary | ICD-10-CM

## 2020-07-06 HISTORY — PX: CRANIOTOMY: SHX93

## 2020-07-06 LAB — URINALYSIS, COMPLETE (UACMP) WITH MICROSCOPIC
Bacteria, UA: NONE SEEN
Bilirubin Urine: NEGATIVE
Glucose, UA: >=500 mg/dL — AB
Hgb urine dipstick: NEGATIVE
Ketones, ur: 80 mg/dL — AB
Leukocytes,Ua: NEGATIVE
Nitrite: NEGATIVE
Protein, ur: NEGATIVE mg/dL
Specific Gravity, Urine: 1.015 (ref 1.005–1.030)
pH: 6 (ref 5.0–8.0)

## 2020-07-06 LAB — GLUCOSE, CAPILLARY
Glucose-Capillary: 127 mg/dL — ABNORMAL HIGH (ref 70–99)
Glucose-Capillary: 120 mg/dL — ABNORMAL HIGH (ref 70–99)
Glucose-Capillary: 174 mg/dL — ABNORMAL HIGH (ref 70–99)
Glucose-Capillary: 302 mg/dL — ABNORMAL HIGH (ref 70–99)
Glucose-Capillary: 264 mg/dL — ABNORMAL HIGH (ref 70–99)

## 2020-07-06 LAB — BETA-2-GLYCOPROTEIN I ABS, IGG/M/A
Beta-2 Glyco I IgG: <9 GPI IgG units (ref 0–20)
Beta-2-Glycoprotein I IgA: 30 GPI IgA units — ABNORMAL HIGH (ref 0–25)
Beta-2-Glycoprotein I IgM: <9 GPI IgM units (ref 0–32)

## 2020-07-06 LAB — BASIC METABOLIC PANEL
Anion gap: 8 (ref 5–15)
BUN: 5 mg/dL — ABNORMAL LOW (ref 6–20)
CO2: 19 mmol/L — ABNORMAL LOW (ref 22–32)
Calcium: 8 mg/dL — ABNORMAL LOW (ref 8.9–10.3)
Chloride: 117 mmol/L — ABNORMAL HIGH (ref 98–111)
Creatinine, Ser: 0.59 mg/dL (ref 0.44–1.00)
GFR, Estimated: >60 mL/min (ref >60)
Glucose, Bld: 125 mg/dL — ABNORMAL HIGH (ref 70–99)
Potassium: 4.1 mmol/L (ref 3.5–5.1)
Sodium: 144 mmol/L (ref 135–145)

## 2020-07-06 LAB — CBC
HCT: 28.7 % — ABNORMAL LOW (ref 36.0–46.0)
Hemoglobin: 7.4 g/dL — ABNORMAL LOW (ref 12.0–15.0)
MCH: 18.4 pg — ABNORMAL LOW (ref 26.0–34.0)
MCHC: 25.8 g/dL — ABNORMAL LOW (ref 30.0–36.0)
MCV: 71.4 fL — ABNORMAL LOW (ref 80.0–100.0)
Platelets: 123 10*3/uL — ABNORMAL LOW (ref 150–400)
RBC: 4.02 MIL/uL (ref 3.87–5.11)
RDW: 21.5 % — ABNORMAL HIGH (ref 11.5–15.5)
WBC: 12.7 10*3/uL — ABNORMAL HIGH (ref 4.0–10.5)
nRBC: 0 % (ref 0.0–0.2)

## 2020-07-06 LAB — LUPUS ANTICOAGULANT PANEL
DRVVT: 33.1 s (ref 0.0–47.0)
PTT Lupus Anticoagulant: 29.7 s (ref 0.0–51.9)

## 2020-07-06 LAB — MAGNESIUM: Magnesium: 2.1 mg/dL (ref 1.7–2.4)

## 2020-07-06 LAB — HOMOCYSTEINE: Homocysteine: 12.6 umol/L (ref 0.0–14.5)

## 2020-07-06 LAB — PHOSPHORUS: Phosphorus: 2.1 mg/dL — ABNORMAL LOW (ref 2.5–4.6)

## 2020-07-06 LAB — ANTINUCLEAR ANTIBODIES, IFA: ANA Ab, IFA: NEGATIVE

## 2020-07-06 LAB — SODIUM
Sodium: 147 mmol/L — ABNORMAL HIGH (ref 135–145)
Sodium: 150 mmol/L — ABNORMAL HIGH (ref 135–145)

## 2020-07-06 SURGERY — CRANIOTOMY HEMATOMA EVACUATION SUBDURAL
Anesthesia: General | Laterality: Right

## 2020-07-06 MED ORDER — BACITRACIN ZINC 500 UNIT/GM EX OINT
TOPICAL_OINTMENT | CUTANEOUS | Status: AC
  Filled 2020-07-06: qty 28.35

## 2020-07-06 MED ORDER — ROCURONIUM BROMIDE 10 MG/ML (PF) SYRINGE
PREFILLED_SYRINGE | INTRAVENOUS | Status: AC
  Filled 2020-07-06: qty 10

## 2020-07-06 MED ORDER — ATORVASTATIN CALCIUM 40 MG PO TABS
40.0000 mg | ORAL_TABLET | Freq: Every day | ORAL | Status: DC
  Administered 2020-07-07 – 2020-08-02 (×26): 40 mg
  Filled 2020-07-06 (×26): qty 1

## 2020-07-06 MED ORDER — ACETAMINOPHEN 650 MG RE SUPP: 650.0000 mg | RECTAL | Status: DC

## 2020-07-06 MED ORDER — CEFAZOLIN SODIUM-DEXTROSE 2-3 GM-%(50ML) IV SOLR
INTRAVENOUS | Status: DC | PRN
  Administered 2020-07-06: 2 g via INTRAVENOUS

## 2020-07-06 MED ORDER — POTASSIUM & SODIUM PHOSPHATES 280-160-250 MG PO PACK
1.0000 | PACK | Freq: Three times a day (TID) | ORAL | Status: AC
  Administered 2020-07-06 (×3): 1
  Filled 2020-07-06 (×4): qty 1

## 2020-07-06 MED ORDER — LIDOCAINE-EPINEPHRINE 1 %-1:100000 IJ SOLN
INTRAMUSCULAR | Status: DC | PRN
  Administered 2020-07-06: 10 mL
  Administered 2020-07-06: 5 mL

## 2020-07-06 MED ORDER — SODIUM CHLORIDE 0.9% FLUSH: 10.0000 mL | INTRAVENOUS | Status: DC | PRN

## 2020-07-06 MED ORDER — MORPHINE SULFATE (PF) 2 MG/ML IV SOLN: 1.0000 mg | INTRAVENOUS | Status: DC | PRN

## 2020-07-06 MED ORDER — BACITRACIN ZINC 500 UNIT/GM EX OINT
TOPICAL_OINTMENT | CUTANEOUS | Status: DC | PRN
  Administered 2020-07-06: 1 via TOPICAL

## 2020-07-06 MED ORDER — DEXAMETHASONE SODIUM PHOSPHATE 10 MG/ML IJ SOLN
INTRAMUSCULAR | Status: AC
  Filled 2020-07-06: qty 1

## 2020-07-06 MED ORDER — THROMBIN 5000 UNITS EX SOLR
CUTANEOUS | Status: AC
  Filled 2020-07-06: qty 5000

## 2020-07-06 MED ORDER — THROMBIN 20000 UNITS EX SOLR
CUTANEOUS | Status: DC | PRN
  Administered 2020-07-06: 20 mL via TOPICAL

## 2020-07-06 MED ORDER — DEXAMETHASONE SODIUM PHOSPHATE 10 MG/ML IJ SOLN
INTRAMUSCULAR | Status: DC | PRN
  Administered 2020-07-06: 10 mg via INTRAVENOUS

## 2020-07-06 MED ORDER — PANTOPRAZOLE SODIUM 40 MG IV SOLR
40.0000 mg | Freq: Every day | INTRAVENOUS | Status: DC
  Administered 2020-07-06: 40 mg via INTRAVENOUS
  Filled 2020-07-06 (×2): qty 40

## 2020-07-06 MED ORDER — BUSPIRONE HCL 15 MG PO TABS
30.0000 mg | ORAL_TABLET | Freq: Three times a day (TID) | ORAL | Status: DC
  Filled 2020-07-06: qty 2

## 2020-07-06 MED ORDER — FENTANYL CITRATE (PF) 250 MCG/5ML IJ SOLN
INTRAMUSCULAR | Status: AC
  Filled 2020-07-06: qty 5

## 2020-07-06 MED ORDER — POTASSIUM CHLORIDE IN NACL 20-0.9 MEQ/L-% IV SOLN
INTRAVENOUS | Status: DC
  Filled 2020-07-06: qty 1000

## 2020-07-06 MED ORDER — PROMETHAZINE HCL 25 MG PO TABS: 12.5000 mg | ORAL_TABLET | ORAL | Status: DC | PRN

## 2020-07-06 MED ORDER — THROMBIN 20000 UNITS EX SOLR
CUTANEOUS | Status: AC
  Filled 2020-07-06: qty 20000

## 2020-07-06 MED ORDER — PROPOFOL 10 MG/ML IV BOLUS
INTRAVENOUS | Status: DC | PRN
Start: 2020-07-06 — End: 2020-07-06
  Administered 2020-07-06: 50 mg via INTRAVENOUS

## 2020-07-06 MED ORDER — LACTATED RINGERS IV SOLN: INTRAVENOUS | Status: DC | PRN

## 2020-07-06 MED ORDER — PROPOFOL 10 MG/ML IV BOLUS
INTRAVENOUS | Status: AC
  Filled 2020-07-06: qty 20

## 2020-07-06 MED ORDER — ONDANSETRON HCL 4 MG/2ML IJ SOLN: 4.0000 mg | INTRAMUSCULAR | Status: DC | PRN

## 2020-07-06 MED ORDER — MANNITOL 25 % IV SOLN
INTRAVENOUS | Status: DC | PRN
  Administered 2020-07-06: 50 g via INTRAVENOUS

## 2020-07-06 MED ORDER — THROMBIN 5000 UNITS EX SOLR
OROMUCOSAL | Status: DC | PRN
  Administered 2020-07-06: 5 mL via TOPICAL

## 2020-07-06 MED ORDER — ONDANSETRON HCL 4 MG PO TABS
4.0000 mg | ORAL_TABLET | ORAL | Status: DC | PRN
  Administered 2020-07-19: 4 mg via ORAL
  Filled 2020-07-06: qty 1

## 2020-07-06 MED ORDER — ATORVASTATIN CALCIUM 40 MG PO TABS: 40.0000 mg | ORAL_TABLET | Freq: Every day | ORAL | Status: DC

## 2020-07-06 MED ORDER — 0.9 % SODIUM CHLORIDE (POUR BTL) OPTIME
TOPICAL | Status: DC | PRN
  Administered 2020-07-06 (×3): 1000 mL

## 2020-07-06 MED ORDER — BUPIVACAINE-EPINEPHRINE 0.5% -1:200000 IJ SOLN
INTRAMUSCULAR | Status: DC | PRN
  Administered 2020-07-06: 5 mL
  Administered 2020-07-06: 10 mL

## 2020-07-06 MED ORDER — SODIUM CHLORIDE 0.9 % IV SOLN
INTRAVENOUS | Status: DC | PRN
  Administered 2020-07-06 – 2020-07-09 (×3): 250 mL via INTRAVENOUS
  Administered 2020-07-11: 500 mL via INTRAVENOUS
  Administered 2020-07-13: 250 mL via INTRAVENOUS

## 2020-07-06 MED ORDER — LIDOCAINE-EPINEPHRINE 1 %-1:100000 IJ SOLN
INTRAMUSCULAR | Status: AC
  Filled 2020-07-06: qty 1

## 2020-07-06 MED ORDER — GLUCERNA 1.5 CAL PO LIQD
1000.0000 mL | ORAL | Status: DC
  Administered 2020-07-06 – 2020-08-01 (×19): 1000 mL
  Filled 2020-07-06 (×42): qty 1000

## 2020-07-06 MED ORDER — ACETAMINOPHEN 325 MG PO TABS
650.0000 mg | ORAL_TABLET | ORAL | Status: DC
  Filled 2020-07-06 (×2): qty 2

## 2020-07-06 MED ORDER — SODIUM CHLORIDE 0.9 % IV SOLN: INTRAVENOUS | Status: DC | PRN

## 2020-07-06 MED ORDER — SODIUM CHLORIDE 0.9% FLUSH
10.0000 mL | Freq: Two times a day (BID) | INTRAVENOUS | Status: DC
  Administered 2020-07-06: 30 mL
  Administered 2020-07-07 – 2020-07-13 (×14): 10 mL
  Administered 2020-07-14: 20 mL
  Administered 2020-07-14 – 2020-07-15 (×2): 10 mL
  Administered 2020-07-15: 20 mL
  Administered 2020-07-16 – 2020-07-28 (×21): 10 mL
  Administered 2020-07-28: 3 mL
  Administered 2020-07-29 – 2020-08-03 (×11): 10 mL

## 2020-07-06 MED ORDER — CEFAZOLIN SODIUM-DEXTROSE 2-4 GM/100ML-% IV SOLN
2.0000 g | INTRAVENOUS | Status: AC
  Filled 2020-07-06: qty 100

## 2020-07-06 MED ORDER — ACETAMINOPHEN 325 MG PO TABS: 650.0000 mg | ORAL_TABLET | ORAL | Status: DC | PRN

## 2020-07-06 MED ORDER — LABETALOL HCL 5 MG/ML IV SOLN
10.0000 mg | INTRAVENOUS | Status: DC | PRN
  Administered 2020-07-10 – 2020-07-11 (×2): 20 mg via INTRAVENOUS
  Filled 2020-07-06 (×2): qty 4

## 2020-07-06 MED ORDER — CEFAZOLIN SODIUM-DEXTROSE 1-4 GM/50ML-% IV SOLN
1.0000 g | Freq: Three times a day (TID) | INTRAVENOUS | Status: AC
  Administered 2020-07-06 – 2020-07-07 (×2): 1 g via INTRAVENOUS
  Filled 2020-07-06 (×2): qty 50

## 2020-07-06 MED ORDER — ADULT MULTIVITAMIN W/MINERALS CH
1.0000 | ORAL_TABLET | Freq: Every day | ORAL | Status: DC
  Administered 2020-07-06 – 2020-08-03 (×28): 1
  Filled 2020-07-06 (×28): qty 1

## 2020-07-06 MED ORDER — ACETAMINOPHEN 160 MG/5ML PO SOLN
650.0000 mg | ORAL | Status: DC
  Administered 2020-07-07 – 2020-07-12 (×30): 650 mg
  Filled 2020-07-06 (×29): qty 20.3

## 2020-07-06 MED ORDER — FENTANYL CITRATE (PF) 250 MCG/5ML IJ SOLN
INTRAMUSCULAR | Status: DC | PRN
  Administered 2020-07-06: 50 ug via INTRAVENOUS
  Administered 2020-07-06: 100 ug via INTRAVENOUS
  Administered 2020-07-06 (×2): 50 ug via INTRAVENOUS

## 2020-07-06 MED ORDER — SODIUM CHLORIDE (PF) 0.9 % IJ SOLN
INTRAMUSCULAR | Status: AC
  Filled 2020-07-06: qty 10

## 2020-07-06 MED ORDER — LEVETIRACETAM IN NACL 500 MG/100ML IV SOLN
500.0000 mg | Freq: Two times a day (BID) | INTRAVENOUS | Status: DC
  Administered 2020-07-06 – 2020-07-13 (×14): 500 mg via INTRAVENOUS
  Filled 2020-07-06 (×14): qty 100

## 2020-07-06 MED ORDER — ROCURONIUM BROMIDE 10 MG/ML (PF) SYRINGE
PREFILLED_SYRINGE | INTRAVENOUS | Status: DC | PRN
  Administered 2020-07-06: 40 mg via INTRAVENOUS
  Administered 2020-07-06: 20 mg via INTRAVENOUS
  Administered 2020-07-06: 50 mg via INTRAVENOUS
  Administered 2020-07-06: 60 mg via INTRAVENOUS

## 2020-07-06 MED ORDER — BUSPIRONE HCL 15 MG PO TABS
30.0000 mg | ORAL_TABLET | Freq: Three times a day (TID) | ORAL | Status: DC
  Administered 2020-07-06 – 2020-07-12 (×18): 30 mg
  Filled 2020-07-06 (×17): qty 2

## 2020-07-06 MED ORDER — SODIUM CHLORIDE 23.4 % INJECTION (4 MEQ/ML) FOR IV ADMINISTRATION
120.0000 meq | INTRAVENOUS | Status: AC
  Administered 2020-07-06: 120 meq via INTRAVENOUS
  Filled 2020-07-06: qty 30

## 2020-07-06 MED ORDER — PANTOPRAZOLE SODIUM 40 MG PO PACK
40.0000 mg | PACK | Freq: Every day | ORAL | Status: DC
  Administered 2020-07-06 – 2020-07-12 (×7): 40 mg
  Filled 2020-07-06 (×7): qty 20

## 2020-07-06 MED ORDER — ACETAMINOPHEN 650 MG RE SUPP: 650.0000 mg | RECTAL | Status: DC | PRN

## 2020-07-06 SURGICAL SUPPLY — 74 items
ADH SKN CLS APL DERMABOND .7 (GAUZE/BANDAGES/DRESSINGS) ×1
BASKET BONE COLLECTION (BASKET) IMPLANT
BIT DRILL WIRE PASS 1.3MM (BIT) IMPLANT
BNDG CMPR 75X41 PLY HI ABS (GAUZE/BANDAGES/DRESSINGS)
BNDG GAUZE ELAST 4 BULKY (GAUZE/BANDAGES/DRESSINGS) IMPLANT
BNDG STRETCH 4X75 STRL LF (GAUZE/BANDAGES/DRESSINGS) IMPLANT
BUR ACORN 6.0 PRECISION (BURR) ×2 IMPLANT
BUR SPIRAL ROUTER 2.3 (BUR) IMPLANT
CANISTER SUCT 3000ML PPV (MISCELLANEOUS) ×2 IMPLANT
CARTRIDGE OIL MAESTRO DRILL (MISCELLANEOUS) ×1 IMPLANT
CATH ROBINSON RED A/P 12FR (CATHETERS) IMPLANT
CLIP RANEY DISP (INSTRUMENTS) ×2 IMPLANT
CLIP VESOCCLUDE MED 6/CT (CLIP) IMPLANT
COVER WAND RF STERILE (DRAPES) ×2 IMPLANT
DECANTER SPIKE VIAL GLASS SM (MISCELLANEOUS) ×2 IMPLANT
DERMABOND ADVANCED (GAUZE/BANDAGES/DRESSINGS) ×1
DERMABOND ADVANCED .7 DNX12 (GAUZE/BANDAGES/DRESSINGS) ×1 IMPLANT
DIFFUSER DRILL AIR PNEUMATIC (MISCELLANEOUS) ×2 IMPLANT
DRAIN PENROSE 1/2X12 LTX STRL (WOUND CARE) IMPLANT
DRAPE NEUROLOGICAL W/INCISE (DRAPES) ×2 IMPLANT
DRAPE WARM FLUID 44X44 (DRAPES) ×2 IMPLANT
DRILL WIRE PASS 1.3MM (BIT)
DRSG OPSITE 4X5.5 SM (GAUZE/BANDAGES/DRESSINGS) ×2 IMPLANT
DRSG OPSITE POSTOP 4X6 (GAUZE/BANDAGES/DRESSINGS) ×2 IMPLANT
DRSG OPSITE POSTOP 4X8 (GAUZE/BANDAGES/DRESSINGS) ×2 IMPLANT
DRSG PAD ABDOMINAL 8X10 ST (GAUZE/BANDAGES/DRESSINGS) IMPLANT
DRSG TELFA 3X8 NADH (GAUZE/BANDAGES/DRESSINGS) ×2 IMPLANT
DURAPREP 6ML APPLICATOR 50/CS (WOUND CARE) ×2 IMPLANT
ELECT REM PT RETURN 9FT ADLT (ELECTROSURGICAL) ×2
ELECTRODE REM PT RTRN 9FT ADLT (ELECTROSURGICAL) ×1 IMPLANT
EVACUATOR SILICONE 100CC (DRAIN) IMPLANT
GAUZE 4X4 16PLY RFD (DISPOSABLE) IMPLANT
GAUZE SPONGE 4X4 12PLY STRL (GAUZE/BANDAGES/DRESSINGS) IMPLANT
GLOVE BIO SURGEON STRL SZ8 (GLOVE) ×4 IMPLANT
GLOVE BIOGEL PI IND STRL 8.5 (GLOVE) ×2 IMPLANT
GLOVE BIOGEL PI INDICATOR 8.5 (GLOVE) ×2
GLOVE ECLIPSE 8.0 STRL XLNG CF (GLOVE) ×2 IMPLANT
GLOVE EXAM NITRILE XL STR (GLOVE) IMPLANT
GLOVE SRG 8 PF TXTR STRL LF DI (GLOVE) ×1 IMPLANT
GLOVE SURG POLYISO LF SZ7.5 (GLOVE) ×4 IMPLANT
GLOVE SURG UNDER POLY LF SZ8 (GLOVE) ×2
GOWN STRL REUS W/ TWL LRG LVL3 (GOWN DISPOSABLE) ×1 IMPLANT
GOWN STRL REUS W/ TWL XL LVL3 (GOWN DISPOSABLE) IMPLANT
GOWN STRL REUS W/TWL 2XL LVL3 (GOWN DISPOSABLE) ×2 IMPLANT
GOWN STRL REUS W/TWL LRG LVL3 (GOWN DISPOSABLE) ×2
GOWN STRL REUS W/TWL XL LVL3 (GOWN DISPOSABLE)
GRAFT SUTURABLE BP 6CMX8CM (Tissue) ×6 IMPLANT
HEMOSTAT POWDER SURGIFOAM 1G (HEMOSTASIS) ×2 IMPLANT
HEMOSTAT SURGICEL 2X14 (HEMOSTASIS) ×2 IMPLANT
KIT BASIN OR (CUSTOM PROCEDURE TRAY) ×2 IMPLANT
KIT TURNOVER KIT B (KITS) ×2 IMPLANT
NEEDLE HYPO 25X1 1.5 SAFETY (NEEDLE) ×2 IMPLANT
NS IRRIG 1000ML POUR BTL (IV SOLUTION) ×6 IMPLANT
OIL CARTRIDGE MAESTRO DRILL (MISCELLANEOUS) ×2
PACK CRANIOTOMY CUSTOM (CUSTOM PROCEDURE TRAY) ×2 IMPLANT
PAD ARMBOARD 7.5X6 YLW CONV (MISCELLANEOUS) ×2 IMPLANT
PATTIES SURGICAL .5 X.5 (GAUZE/BANDAGES/DRESSINGS) IMPLANT
PATTIES SURGICAL .5 X3 (DISPOSABLE) IMPLANT
PATTIES SURGICAL 1X1 (DISPOSABLE) IMPLANT
PIN MAYFIELD SKULL DISP (PIN) IMPLANT
SPECIMEN JAR SMALL (MISCELLANEOUS) IMPLANT
SPONGE NEURO XRAY DETECT 1X3 (DISPOSABLE) IMPLANT
SPONGE SURGIFOAM ABS GEL 100 (HEMOSTASIS) ×2 IMPLANT
STAPLER SKIN PROX WIDE 3.9 (STAPLE) ×4 IMPLANT
SUT ETHILON 3 0 PS 1 (SUTURE) IMPLANT
SUT NURALON 4 0 TR CR/8 (SUTURE) ×6 IMPLANT
SUT VIC AB 2-0 CP2 18 (SUTURE) ×6 IMPLANT
SUT VIC AB 3-0 SH 8-18 (SUTURE) IMPLANT
SYR CONTROL 10ML LL (SYRINGE) IMPLANT
TOWEL GREEN STERILE (TOWEL DISPOSABLE) ×2 IMPLANT
TOWEL GREEN STERILE FF (TOWEL DISPOSABLE) ×2 IMPLANT
TRAY FOLEY MTR SLVR 16FR STAT (SET/KITS/TRAYS/PACK) ×2 IMPLANT
UNDERPAD 30X36 HEAVY ABSORB (UNDERPADS AND DIAPERS) IMPLANT
WATER STERILE IRR 1000ML POUR (IV SOLUTION) ×2 IMPLANT

## 2020-07-06 NOTE — Anesthesia Postprocedure Evaluation (Signed)
Anesthesia Post Note  Patient: Brandi Myers  Procedure(s) Performed: RIGHT CRANIECTOMY WITH PLACEMENT OF BONE FLAP IN ABDOMEN (Right )     Patient location during evaluation: ICU Anesthesia Type: General Level of consciousness: patient remains intubated per anesthesia plan Pain management: pain level controlled Vital Signs Assessment: post-procedure vital signs reviewed and stable Respiratory status: patient remains intubated per anesthesia plan Cardiovascular status: stable Postop Assessment: no apparent nausea or vomiting Anesthetic complications: no   No complications documented.  Last Vitals:  Vitals:   07/06/20 1100 07/06/20 1123  BP: (!) 143/80 (!) 143/80  Pulse: 94 96  Resp: 18 19  Temp:    SpO2: 100% 100%    Last Pain:  Vitals:   07/06/20 0800  TempSrc: Axillary                 Caren Macadam

## 2020-07-06 NOTE — Plan of Care (Signed)
On-call neurology note Repeat CT head completed. Formal read pending Preliminary review looks unchanged from the CT head from last night. Stroke team to round and decide on heparinization as well as neurosurgical consultation.  -- Milon Dikes, MD Neurologist [facility] Pager: [phone]

## 2020-07-06 NOTE — Progress Notes (Signed)
Initial Nutrition Assessment  DOCUMENTATION CODES:   Not applicable  INTERVENTION:   Initiate tube feeding via OG tube: Glucerna 1.5 at 50 ml/h (1200 ml per day)  Provides 1800 kcal, 99 gm protein, 911 ml free water daily  MVI with minerals   Monitor magnesium and phosphorus every 12 hours x 4 occurances, MD to replete as needed   NUTRITION DIAGNOSIS:   Inadequate oral intake related to inability to eat as evidenced by NPO status.  GOAL:   Patient will meet greater than or equal to 90% of their needs  MONITOR:   TF tolerance,Vent status  REASON FOR ASSESSMENT:   Consult Enteral/tube feeding initiation and management,Assessment of nutrition requirement/status  ASSESSMENT:   Pt with PMH of HTN and DM who admitted with L sided weakness and slurred speech per CT pt with R CVA.    Pt discussed during ICU rounds and with RN.  Pt positive for THC and cocaine on admission, per staff pt's sister just passed from OD 3 months ago Per chart review pt has had 9% weight loss since last documented weight in 10/2019. Unable to confirm nutrition hx at this time.  Pt having central line placed at time of visit.   3/23 s/p tPA and IR 3/24 hemorrhagic transformation, RUE thrombectomy  3/25 s/p R crani with placement of bone flap in R abd   Patient is currently intubated on ventilator support MV: 9 L/min Temp (24hrs), Avg:100.8 F (38.2 C), Min:98.9 F (37.2 C), Max:101.7 F (38.7 C)  Medications reviewed and include: colace, SSI, levemir, miralax, phosNa 1 packet TID 3% hypertonic saline  Labs reviewed: Na 147, PO4: 2.1, A1C: 10.6   30F OG tube: tip in stomach per xray   NUTRITION - FOCUSED PHYSICAL EXAM:  Pt unavailable at time of visit  Diet Order:   Diet Order            Diet NPO time specified  Diet effective now                 EDUCATION NEEDS:   No education needs have been identified at this time  Skin:  Skin Assessment: Reviewed RN Assessment  (incision)  Last BM:  unknown  Height:   Ht Readings from Last 1 Encounters:  07/04/20 5\' 5"  (1.651 m)    Weight:   Wt Readings from Last 1 Encounters:  07/04/20 72.2 kg    Ideal Body Weight:  56.8 kg  BMI:  Body mass index is 26.49 kg/m.  Estimated Nutritional Needs:   Kcal:  1800-2000  Protein:  90-110 grams  Fluid:  >1.8 L/day  07/06/20., RD, LDN, CNSC See AMiON for contact information

## 2020-07-06 NOTE — Anesthesia Postprocedure Evaluation (Signed)
Anesthesia Post Note  Patient: Brandi Myers  Procedure(s) Performed: RIGHT UPPER EXTREMITY THROMBECTOMY (Right Arm Upper)     Patient location during evaluation: SICU Anesthesia Type: General Level of consciousness: sedated Pain management: pain level controlled Vital Signs Assessment: post-procedure vital signs reviewed and stable Respiratory status: patient remains intubated per anesthesia plan Cardiovascular status: stable Postop Assessment: no apparent nausea or vomiting Anesthetic complications: no   No complications documented.  Last Vitals:  Vitals:   07/05/20 2144 07/05/20 2321  BP: 115/70   Pulse: (!) 112   Resp: 16   Temp:    SpO2: 100% 100%    Last Pain:  Vitals:   07/05/20 1920  TempSrc: Axillary                 Nelle Don Gerad Cornelio

## 2020-07-06 NOTE — Progress Notes (Signed)
On-call neurology note  Spoke with the father and updated him on the worsening CT head finding and possible need for neurosurgery in the morning. He requests that he be updated in the morning and any surgical decision the only made after speaking with him. I assured him that that will be done.   Milon Dikes, MD Neurologist [facility] Pager: [phone]

## 2020-07-06 NOTE — Progress Notes (Signed)
Patient taken down to OR at this time with transport and CRNA.  Report was given to CRNA.  Consent on chart.

## 2020-07-06 NOTE — Progress Notes (Signed)
Patient transported to CT and back to 4N22 on ventilator. No complications or incidents noted.

## 2020-07-06 NOTE — Brief Op Note (Signed)
07/06/2020  1:37 PM  PATIENT:  Brandi Myers  34 y.o. 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   it was therefore elected to take her to surgery for decompressive craniectomy.  Procedure:  Patient was intubated prior to arrival in OR, patient was placed in left semi-lateral position with blanket roll.  Head was placed on donut head holder and right frontal scalp was shaved and prepped and draped in usual sterile fashion.  Area of planned incision was infiltrated with lidocaine. A curvilinear incision was made and carried through temporalis fascia and muscle to expose calvarium.   A large craniotomy flap was elevated. Skull flap was elevated exposing tense dura and swollen brain.  Dura was opened and brain was swollen, but became pulsatile.   Hemostasis was assured.  The dura was placed back over the brain and 3 large sheets of dural matrix graft were placed over the brain. The galea was closed with 2-0 vicryl stitches and the skin was re approximated with staples. Bone flap was then placed in a separate incision in the subcutaneous tissues of the right side of the abdomen and this wound was closed in a similar fashion.  Sterile occlusive dressings were placed.  Patient  was taken back to the Neuro ICU in stable condition having tolerated her surgery well.    PLAN OF CARE: Admit to inpatient   PATIENT DISPOSITION:  PACU - hemodynamically stable.   Delay start of Pharmacological VTE agent (>24hrs) due to surgical blood loss or risk of bleeding: yes  

## 2020-07-06 NOTE — Progress Notes (Addendum)
STROKE TEAM PROGRESS NOTE   INTERVAL HISTORY Late yesterday afternoon her Right arm was found to be cold to the touch with poor circulation. She was taken to the OR by Vascular surgery. The brachial artery was opened and acute and subacute appearing thrombus were removed. Radial and ulnar flow was restored. Overnight her CT showed worsening midline shift. Neurosurgery was consulted.  She is still intubated. She would only move her right side to noxious stimuli. Otherwise none responsive. Will be taken to surgery for decompressive hemicraniectomy. Patients father was contacted and he gave consent for the procedure. Will await outcome of the surgery and Neurosurgery recommendations.  IR, CCM, Vascular surgery, and Neurosurgery are following.  Vitals:   07/06/20 0533 07/06/20 0600 07/06/20 0700 07/06/20 0739  BP:  134/84 133/81 128/79  Pulse: (!) 120 98 95 90  Resp: 19 15 18 17   Temp:      TempSrc:      SpO2: 100% 100% 100% 100%  Weight:      Height:       CBC:  Recent Labs  Lab 07/04/20 1247 07/04/20 1844 07/05/20 0541 07/06/20 0240  WBC 17.8*  --  14.2* 12.7*  NEUTROABS 12.7*  --  12.5*  --   HGB 9.5*   < > 8.6* 7.4*  HCT 35.0*   < > 32.0* 28.7*  MCV 67.2*  --  69.3* 71.4*  PLT 235  --  160 123*   < > = values in this interval not displayed.   Basic Metabolic Panel:  Recent Labs  Lab 07/05/20 0541 07/06/20 0240  NA 141 144  K 3.1* 4.1  CL 107 117*  CO2 25 19*  GLUCOSE 147* 125*  BUN <5* 5*  CREATININE 0.61 0.59  CALCIUM 8.1* 8.0*  MG  --  2.1  PHOS  --  2.1*   Lipid Panel:  Recent Labs  Lab 07/05/20 0541  CHOL 154  TRIG 79  86  HDL 39*  CHOLHDL 3.9  VLDL 16  LDLCALC 99   HgbA1c:  Recent Labs  Lab 07/04/20 1814  HGBA1C 10.6*   Urine Drug Screen:  Recent Labs  Lab 07/05/20 0010  LABOPIA NONE DETECTED  COCAINSCRNUR POSITIVE*  LABBENZ NONE DETECTED  AMPHETMU NONE DETECTED  THCU POSITIVE*  LABBARB NONE DETECTED    Alcohol Level  Recent Labs   Lab 07/04/20 1247  ETH <10    IMAGING past 24 hours CT HEAD WO CONTRAST  Result Date: 07/05/2020 CLINICAL DATA:  Follow-up examination for acute stroke. EXAM: CT HEAD WITHOUT CONTRAST TECHNIQUE: Contiguous axial images were obtained from the base of the skull through the vertex without intravenous contrast. COMPARISON:  Comparison made with prior MRI from earlier the same day. FINDINGS: Brain: Continued interval evolution of right MCA distribution infarct, stable from prior MRI. Associated hemorrhagic transformation with hyperdense blood products throughout the area of infarction. Worsened regional mass effect and edema, with progressive effacement of the right lateral ventricle. Associated right-to-left midline shift has increased now measuring up to 9 mm. Increased dilatation of the right lateral ventricle concerning for developing ventricular trapping. Basilar cisterns remain patent. No transtentorial herniation. No other acute large vessel territory infarct or hemorrhage. No extra-axial collection. Vascular: No definite hyperdense vessel. Skull: Scalp soft tissues and calvarium within normal limits. Sinuses/Orbits: Visualized globes and orbital soft tissues within normal limits. Scattered mucosal thickening throughout the ethmoidal air cells, maxillary sinuses, and sphenoid sinuses. Small bilateral mastoid effusions noted. Other: None. IMPRESSION: 1. Continued interval evolution  of right MCA distribution infarct with associated hemorrhagic transformation. Worsened regional mass effect and edema with increased right-to-left midline shift now measuring up to 9 mm. Increased dilatation of the right lateral ventricle concerning for developing ventricular trapping. 2. No other new acute intracranial abnormality. Critical Value/emergent results were called by telephone at the time of interpretation on 07/05/2020 at 11:05 pm to provider Dr. Wilford Corner, who verbally acknowledged these results. Electronically Signed    By: Rise Mu M.D.   On: 07/05/2020 23:06   MR ANGIO HEAD WO CONTRAST  Result Date: 07/05/2020 CLINICAL DATA:  Stroke.  Post thrombectomy and tPA EXAM: MRI HEAD WITHOUT CONTRAST MRA HEAD WITHOUT CONTRAST TECHNIQUE: Multiplanar, multiecho pulse sequences of the brain and surrounding structures were obtained without intravenous contrast. Angiographic images of the head were obtained using MRA technique without contrast. COMPARISON:  CT angio head and neck 07/04/2020 FINDINGS: MRI HEAD FINDINGS Brain: Acute infarct right MCA territory. This involves much of the right basal ganglia as well as the right frontal and temporal lobe. There is hemorrhagic transformation with mild-to-moderate blood in the infarct. There is local mass-effect and midline shift of 6 mm. No hydrocephalus. No evidence of chronic ischemia. No mass lesion. Vascular: Normal arterial flow voids at the base of brain. Skull and upper cervical spine: No focal skeletal abnormality. Sinuses/Orbits: Moderate mucosal edema paranasal sinuses. Bilateral mastoid effusion normal orbit Other: None MRA HEAD FINDINGS Both vertebral arteries are normal. Left PICA patent. Large right AICA patent. Basilar patent. Superior cerebellar and posterior cerebral arteries normal bilaterally. Internal carotid artery is widely patent bilaterally without stenosis or filling defect. Anterior and middle cerebral arteries widely patent bilaterally without stenosis or occlusion. No aneurysm. 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 Hemorrhagic transformation with mild to moderate hemorrhage in the infarct. There is mass-effect and 6 mm midline shift to left Negative MRA head Electronically Signed   By: Marlan Palau M.D.   On: 07/05/2020 10:01   MR BRAIN WO CONTRAST  Result Date: 07/05/2020 CLINICAL DATA:  Stroke.  Post thrombectomy and tPA EXAM: MRI HEAD WITHOUT CONTRAST MRA HEAD WITHOUT CONTRAST  TECHNIQUE: Multiplanar, multiecho pulse sequences of the brain and surrounding structures were obtained without intravenous contrast. Angiographic images of the head were obtained using MRA technique without contrast. COMPARISON:  CT angio head and neck 07/04/2020 FINDINGS: MRI HEAD FINDINGS Brain: Acute infarct right MCA territory. This involves much of the right basal ganglia as well as the right frontal and temporal lobe. There is hemorrhagic transformation with mild-to-moderate blood in the infarct. There is local mass-effect and midline shift of 6 mm. No hydrocephalus. No evidence of chronic ischemia. No mass lesion. Vascular: Normal arterial flow voids at the base of brain. Skull and upper cervical spine: No focal skeletal abnormality. Sinuses/Orbits: Moderate mucosal edema paranasal sinuses. Bilateral mastoid effusion normal orbit Other: None MRA HEAD FINDINGS Both vertebral arteries are normal. Left PICA patent. Large right AICA patent. Basilar patent. Superior cerebellar and posterior cerebral arteries normal bilaterally. Internal carotid artery is widely patent bilaterally without stenosis or filling defect. Anterior and middle cerebral arteries widely patent bilaterally without stenosis or occlusion. No aneurysm. 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 Hemorrhagic transformation with mild to moderate hemorrhage in the infarct. There is mass-effect and 6 mm midline shift to left Negative MRA head Electronically Signed   By: Marlan Palau M.D.   On: 07/05/2020 10:01   VAS [place]  UPPER EXTREMITY ARTERIAL DUPLEX  Result Date: 07/05/2020 UPPER EXTREMITY DUPLEX STUDY Indications: Patient complains of RUE - cool to touch.  Risk Factors: Prior CVA. Performing Technologist: Ernestene Mention  Examination Guidelines: A complete evaluation includes B-mode imaging, spectral Doppler, color Doppler, and power Doppler as needed of all accessible portions of each  vessel. Bilateral testing is considered an integral part of a complete examination. Limited examinations for reoccurring indications may be performed as noted.  Right Doppler Findings: +---------------+----------+----------+--------+--------+ Site           PSV (cm/s)Waveform  StenosisComments +---------------+----------+----------+--------+--------+ Subclavian Prox81        triphasic                  +---------------+----------+----------+--------+--------+ Subclavian Dist          absent                     +---------------+----------+----------+--------+--------+ Axillary       63        triphasic                  +---------------+----------+----------+--------+--------+ Brachial Prox  51        biphasic                   +---------------+----------+----------+--------+--------+ Brachial Mid   33        biphasic                   +---------------+----------+----------+--------+--------+ Brachial Dist  14        monophasicoccluded         +---------------+----------+----------+--------+--------+ Radial Prox    0         absent    occluded         +---------------+----------+----------+--------+--------+ Radial Mid     0         absent    occluded         +---------------+----------+----------+--------+--------+ Radial Dist    0         absent    occluded         +---------------+----------+----------+--------+--------+ Ulnar Prox     0         absent    occluded         +---------------+----------+----------+--------+--------+ Ulnar Mid      0         absent                     +---------------+----------+----------+--------+--------+ Ulnar Dist     0         absent                     +---------------+----------+----------+--------+--------+    Summary:  Right: Occlusion noted in the radial artery, ulnar artery and distal        brachial artery *See table(s) above for measurements and observations. Vascular consult recommended.     Preliminary    ECHOCARDIOGRAM COMPLETE  Result Date: 07/05/2020    ECHOCARDIOGRAM REPORT   Patient Name:   TERECIA PLAUT Baldi Date of Exam: 07/05/2020 Medical Rec #:  [id number]       Height:       65.0 in Accession #:    [id number]      Weight:       159.2 lb Date of Birth:  1985/12/04      BSA:          1.795 m Patient Age:  34 years        BP:           130/91 mmHg Patient Gender: F               HR:           115 bpm. Exam Location:  Inpatient Procedure: 2D Echo, Cardiac Doppler and Color Doppler Indications:    Stroke 434.91  History:        Patient has no prior history of Echocardiogram examinations.                 Risk Factors:Hypertension and Diabetes.  Sonographer:    Elmarie Shiley Dance Referring Phys: [id number] ERIC LINDZEN IMPRESSIONS  1. Left ventricular ejection fraction, by estimation, is 60 to 65%. The left ventricle has normal function. The left ventricle has no regional wall motion abnormalities. Left ventricular diastolic parameters are consistent with Grade I diastolic dysfunction (impaired relaxation).  2. Right ventricular systolic function is normal. The right ventricular size is normal.  3. The mitral valve is normal in structure. Trivial mitral valve regurgitation. No evidence of mitral stenosis.  4. The aortic valve is normal in structure. Aortic valve regurgitation is not visualized. No aortic stenosis is present.  5. The inferior vena cava is normal in size with greater than 50% respiratory variability, suggesting right atrial pressure of 3 mmHg. Conclusion(s)/Recommendation(s): No intracardiac source of embolism detected on this transthoracic study. A transesophageal echocardiogram is recommended to exclude cardiac source of embolism if clinically indicated. FINDINGS  Left Ventricle: Left ventricular ejection fraction, by estimation, is 60 to 65%. The left ventricle has normal function. The left ventricle has no regional wall motion abnormalities. The left ventricular internal cavity size was  normal in size. There is  no left ventricular hypertrophy. Left ventricular diastolic parameters are consistent with Grade I diastolic dysfunction (impaired relaxation). Normal left ventricular filling pressure. Right Ventricle: The right ventricular size is normal. No increase in right ventricular wall thickness. Right ventricular systolic function is normal. Left Atrium: Left atrial size was normal in size. Right Atrium: Right atrial size was normal in size. Pericardium: There is no evidence of pericardial effusion. Mitral Valve: The mitral valve is normal in structure. Trivial mitral valve regurgitation. No evidence of mitral valve stenosis. Tricuspid Valve: The tricuspid valve is normal in structure. Tricuspid valve regurgitation is trivial. No evidence of tricuspid stenosis. Aortic Valve: The aortic valve is normal in structure. Aortic valve regurgitation is not visualized. No aortic stenosis is present. Pulmonic Valve: The pulmonic valve was normal in structure. Pulmonic valve regurgitation is not visualized. No evidence of pulmonic stenosis. Aorta: The aortic root is normal in size and structure. Venous: The inferior vena cava is normal in size with greater than 50% respiratory variability, suggesting right atrial pressure of 3 mmHg. IAS/Shunts: No atrial level shunt detected by color flow Doppler.  LEFT VENTRICLE PLAX 2D LVIDd:         4.20 cm  Diastology LVIDs:         3.20 cm  LV e' medial:    10.30 cm/s LV PW:         0.90 cm  LV E/e' medial:  8.5 LV IVS:        0.80 cm  LV e' lateral:   15.00 cm/s LVOT diam:     2.10 cm  LV E/e' lateral: 5.9 LV SV:         55 LV SV Index:   31 LVOT Area:  3.46 cm  RIGHT VENTRICLE             IVC RV Basal diam:  2.40 cm     IVC diam: 1.40 cm RV S prime:     13.40 cm/s TAPSE (M-mode): 2.2 cm LEFT ATRIUM             Index       RIGHT ATRIUM          Index LA diam:        2.70 cm 1.50 cm/m  RA Area:     8.48 cm LA Vol (A2C):   27.5 ml 15.32 ml/m RA Volume:   17.50 ml  9.75 ml/m LA Vol (A4C):   18.7 ml 10.42 ml/m LA Biplane Vol: 23.2 ml 12.92 ml/m  AORTIC VALVE LVOT Vmax:   99.20 cm/s LVOT Vmean:  66.100 cm/s LVOT VTI:    0.160 m  AORTA Ao Root diam: 3.20 cm Ao Asc diam:  2.60 cm MITRAL VALVE MV Area (PHT): 2.97 cm    SHUNTS MV Decel Time: 256 msec    Systemic VTI:  0.16 m MV E velocity: 88.05 cm/s  Systemic Diam: 2.10 cm Tobias Alexander MD Electronically signed by Tobias Alexander MD Signature Date/Time: 07/05/2020/12:43:53 PM    Final    VAS [place] LOWER EXTREMITY VENOUS (DVT)  Result Date: 07/05/2020  Lower Venous DVT Study Indications: Embolic stroke.  Comparison Study: No prior Performing Technologist: Marilynne Halsted RDMS, RVT  Examination Guidelines: A complete evaluation includes B-mode imaging, spectral Doppler, color Doppler, and power Doppler as needed of all accessible portions of each vessel. Bilateral testing is considered an integral part of a complete examination. Limited examinations for reoccurring indications may be performed as noted. The reflux portion of the exam is performed with the patient in reverse Trendelenburg.  +---------+---------------+---------+-----------+----------+--------------+ RIGHT    CompressibilityPhasicitySpontaneityPropertiesThrombus Aging +---------+---------------+---------+-----------+----------+--------------+ CFV      Full           Yes      Yes                                 +---------+---------------+---------+-----------+----------+--------------+ SFJ      Full                                                        +---------+---------------+---------+-----------+----------+--------------+ FV Prox  Full                                                        +---------+---------------+---------+-----------+----------+--------------+ FV Mid   Full                                                        +---------+---------------+---------+-----------+----------+--------------+ FV DistalFull                                                         +---------+---------------+---------+-----------+----------+--------------+  PFV      Full                                                        +---------+---------------+---------+-----------+----------+--------------+ POP      Full           Yes      Yes                                 +---------+---------------+---------+-----------+----------+--------------+ PTV      Full                                                        +---------+---------------+---------+-----------+----------+--------------+ PERO     Full                                                        +---------+---------------+---------+-----------+----------+--------------+   +---------+---------------+---------+-----------+----------+--------------+ LEFT     CompressibilityPhasicitySpontaneityPropertiesThrombus Aging +---------+---------------+---------+-----------+----------+--------------+ CFV      Full           Yes      Yes                                 +---------+---------------+---------+-----------+----------+--------------+ SFJ      Full                                                        +---------+---------------+---------+-----------+----------+--------------+ FV Prox  Full                                                        +---------+---------------+---------+-----------+----------+--------------+ FV Mid   Full                                                        +---------+---------------+---------+-----------+----------+--------------+ FV DistalFull                                                        +---------+---------------+---------+-----------+----------+--------------+ PFV      Full                                                        +---------+---------------+---------+-----------+----------+--------------+  POP      Full           Yes      Yes                                  +---------+---------------+---------+-----------+----------+--------------+ PTV      Full                                                        +---------+---------------+---------+-----------+----------+--------------+ PERO     Full                                                        +---------+---------------+---------+-----------+----------+--------------+     Summary: BILATERAL: - No evidence of deep vein thrombosis seen in the lower extremities, bilaterally. -No evidence of popliteal cyst, bilaterally.   *See table(s) above for measurements and observations. Electronically signed by Sherald Hess MD on 07/05/2020 at 6:21:17 PM.    Final     PHYSICAL EXAM Blood pressure 128/79, pulse 90, temperature (!) 101.7 F (38.7 C), temperature source Oral, resp. rate 17, height  (1.651 m), weight 72.2 kg, SpO2 100 %.  General: intubated, caucasian female, no apparent distress  Lungs: Symmetrical Chest rise, no labored breathing  Cardio: Regular Rate and Rhythm  Abdomen: Soft, non-tender  Neuro: Intubated. Does not open eyes to voice, does not follow any commands. Cranial Nerves: Pupils 3 mm and minimally responsive to light. Eyes are midposition. No blink to threat bilaterally  Motor: Moves RUE and RLE extremity briskly to noxious stimuli.    ASSESSMENT/PLAN Ms. EMERA BUSSIE is a 35 y.o. female with history of uncontrolled diabetes and hypertension presenting with left sided weakness and slurred speech. Per family there is a complicated social situation at this time. She lives intermittently in an apartment by herself but was kicked out and they went to pick her up at 5:30 AM. At that point she seemed "off"and generally quiet but did not seem to have any difficulty with left-sided weakness.Family attributed this to psychosocial issues and potential substance use noting that her sister passed away from an overdose in October. Later in the morning she  complained of diffuse body pain and was administered gabapentin and a muscle relaxer.At 11:30 AM she stood up and walkedto the showerand at1215 she was found on the bottom of the tub with slurred speech and difficulty moving the left side. She was not preactivated by EMS, but on arrival in the ED clearly appear to have a doorway examination consistent with a right MCA territory infarct.CTA revealed right ICA occlusion as well as a right common carotid bifurcation thrombus extending into the external carotid (felt to be the source of the occlusion). CT perfusion imaging demonstrated possible core infarct of 51 mL (though notably this was obtained within 2 hours of symptom onset and therefore may reflect an overestimate of the true core). Mismatch ratio was 2 with a penumbra of 63 mL. Case was discussed with Dr. Corliss Skains of neuro interventional radiology and Dr. Otelia Limes of [facility] and the patient was accepted for transfer to  [facility] for emergent intervention. She was also started on TPA after consenting the patient's emergency contact with full discussion of risks of bleeding and importance of accurately determining the patient's last known well.[name] confirm the last known well as described above and agreed that the patient would want TPA given the risk/benefit.She confirmed that the patient had no recent procedures or other contraindications to TPA. Regarding her baseline, she is functional at baseline and family believes she is smoking at least1pack a day currently. She is on Metformin for her diabetes, does not take oral contraceptive pills, has had no recent surgeries or procedures, has not had any complaints of bleeding and family believes that she would have informed them of any significant bleeding.  Given her increased midline shift she is being taken to the OR by Neurosurgery for a partial hemicraniotomy. Will need to see how this procedure goes and any  recommendations.  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    CTA head and neck showed right ICA terminal and proximal MCA occlusion, right CCA nonocclusive thrombus.    CT perfusion showed large penumbra.    Status post thrombectomy with right terminal ICA, right PCA and right ACA TICI2b reperfusion however does have distal micro emboli.    MRI showed large right MCA infarct with 31mm MLS and hemorrhagic transformation.    MRA showed patent intracranial vessels.    2D Echo - EF: 60-65%. No wall motion abnormality.  Consider TEE once stable  DVT US - No evidence of DVT bilaterally  LDL 99  HgbA1c 10.6  VTE prophylaxis - SCD's  No antithrombotic prior to admission, now on No antithrombotic due to Hemorrhagic conversion  Therapy recommendations:  Pending  Disposition:  Pending  Cerebral edema  CT Head repeat 3/24 - Continued interval evolution of right MCA distribution infarct with associated hemorrhagic transformation. Worsened regional mass effect and edema with increased right-to-left midline shift now measuring up to 9 mm. Increased dilatation of the right lateral ventricle concerning for developing ventricular trapping. No other new acute intracranial abnormality.  CT repeat 3/25 unchanged right cerebral cytotoxic edema with central hemorrhage. Midline shift is similar at 1 cm. Unchanged left lateral ventricle dilatation with evidence of transependymal flow.  S/p decompressive hemicraniectomy with Dr. Venetia Maxon  CT head repeat pending in am  On keppra  On 3% saline @ 75  23.4% saline x 1  S/p PICC line  Na goal 150-155  Na 137->141->147->150  Fever, central origin?  Tmax 101.7->102.5  Consider TEE once stable to rule out endocarditis  UA neg  CXR pending  B Cx  pending  May need to consider arctic sun if persistent high grade fever  On Ancef  Respiratory failure  Intubated on vent  CCM on board  CXR pending  Hyperlipidemia  Home meds:  none  LDL 99, goal < 70  On lipitor 40 now  Continue statin at discharge  Diabetes type II Uncontrolled  Home meds:  Metformin, Glargine, Aspart  HgbA1c 10.6, goal < 7.0  CBGs  SSI  Cocaine abuse  UDS positive for cocaine  Cessation education will be provided  Tobacco abuse  Current smoker  Smoking cessation counseling will be provided  Other Stroke Risk Factors  Substance abuse - UDS:  THC POSITIVE Will advise patient to stop using due to  stroke risk.  Other Active Problems  Sick thyroid? - TSH: 0.307 (Low)  Free T4:1.27 (high)  Redraw TSH tomrrow  Hospital day # 2  Arna Snipe MD Resident  ATTENDING NOTE: I reviewed above note and agree with the assessment and plan. Pt was seen and examined.   No family at bedside.  Patient neurologically unchanged.  Still intubated, unresponsive, eyes mid position with slight left upward gaze.  Not blinking to visual threat, pupil 2.5 mm equally bilaterally, very sluggish to light.  Positive corneal and gag reflexes.  Weeks painful stimuli, visual on right upper and lower extremity, no movement on the left upper and lower extremity. Sensation, coordination and gait not tested.  CT repeat showed large right MCA infarct with hemorrhagic conversion and midline shift and left lateral ventricle trapping.  Discussed with neurosurgery, had emergent right decompressive hemicraniectomy.  Postop, patient has high-grade fever, on Ancef, UA negative, CXR and blood culture pending, concerning for central origin.  May consider Arctic sun if needed.  Consider TEE once stable.  Sodium 147, PICC line placed, on 3% saline, give 1 dose of 23.4% saline.  Sodium currently 150.  Repeat CT head in the morning.  For detailed assessment and plan, please refer  to above as I have made changes wherever appropriate.   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 This patient's care requires constant monitoring of vital signs, hemodynamics, respiratory and cardiac monitoring, review of multiple databases, neurological assessment, discussion with family, other specialists and medical decision making of high complexity. I spent 45 minutes of neurocritical care time in the care of this patient.  I discussed with Dr. Katrinka Blazing CCM.    To contact Stroke Continuity provider, please refer to [email or web address]. After hours, contact General Neurology

## 2020-07-06 NOTE — Plan of Care (Signed)
Patient remains confused intermittent follows commands.  Problem: Education: Goal: Knowledge of General Education information will improve Description: Including pain rating scale, medication(s)/side effects and non-pharmacologic comfort measures Outcome: Not Progressing   Problem: Health Behavior/Discharge Planning: Goal: Ability to manage health-related needs will improve Outcome: Not Progressing   Problem: Clinical Measurements: Goal: Ability to maintain clinical measurements within normal limits will improve Outcome: Not Progressing Goal: Will remain free from infection Outcome: Not Progressing Goal: Diagnostic test results will improve Outcome: Not Progressing Goal: Respiratory complications will improve Outcome: Not Progressing Goal: Cardiovascular complication will be avoided Outcome: Not Progressing   Problem: Activity: Goal: Risk for activity intolerance will decrease Outcome: Not Progressing   Problem: Nutrition: Goal: Adequate nutrition will be maintained Outcome: Not Progressing   Problem: Coping: Goal: Level of anxiety will decrease Outcome: Not Progressing   Problem: Elimination: Goal: Will not experience complications related to bowel motility Outcome: Not Progressing Goal: Will not experience complications related to urinary retention Outcome: Not Progressing   Problem: Pain Managment: Goal: General experience of comfort will improve Outcome: Not Progressing   Problem: Safety: Goal: Ability to remain free from injury will improve Outcome: Not Progressing   Problem: Skin Integrity: Goal: Risk for impaired skin integrity will decrease Outcome: Not Progressing   Problem: Education: Goal: Knowledge of disease or condition will improve Outcome: Not Progressing Goal: Knowledge of secondary prevention will improve Outcome: Not Progressing Goal: Knowledge of patient specific risk factors addressed and post discharge goals established will  improve Outcome: Not Progressing Goal: Individualized Educational Video(s) Outcome: Not Progressing   Problem: Coping: Goal: Will verbalize positive feelings about self Outcome: Not Progressing Goal: Will identify appropriate support needs Outcome: Not Progressing   Problem: Health Behavior/Discharge Planning: Goal: Ability to manage health-related needs will improve Outcome: Not Progressing   Problem: Self-Care: Goal: Ability to participate in self-care as condition permits will improve Outcome: Not Progressing Goal: Verbalization of feelings and concerns over difficulty with self-care will improve Outcome: Not Progressing Goal: Ability to communicate needs accurately will improve Outcome: Not Progressing   Problem: Nutrition: Goal: Risk of aspiration will decrease Outcome: Not Progressing Goal: Dietary intake will improve Outcome: Not Progressing   Problem: Ischemic Stroke/TIA Tissue Perfusion: Goal: Complications of ischemic stroke/TIA will be minimized Outcome: Not Progressing

## 2020-07-06 NOTE — Progress Notes (Signed)
Patient returned back to unit at this time.  Placed back on monitor and ventilator per RT.  Noted with positional Aline.

## 2020-07-06 NOTE — Progress Notes (Addendum)
  Progress Note    07/06/2020 7:16 AM 1 Day Post-Op  Subjective:  intubated  101.7  Vitals:   07/06/20 0533 07/06/20 0600  BP:  134/84  Pulse: (!) 120 98  Resp: 19 15  Temp:    SpO2: 100% 100%    Physical Exam: Cardiac:  regular Lungs:  intubated Incisions:  Clean and dry Extremities:  Bi/triphasic doppler signals right radial and ulnar arteries; right forearm is soft.  Right hand is warm.  Does not follow commands but moves right arm spontaneously.   CBC    Component Value Date/Time   WBC 12.7 (H) 07/06/2020 0240   RBC 4.02 07/06/2020 0240   HGB 7.4 (L) 07/06/2020 0240   HGB 11.4 (L) 10/22/2012 1800   HCT 28.7 (L) 07/06/2020 0240   HCT 31.4 (L) 10/23/2012 0518   PLT 123 (L) 07/06/2020 0240   PLT 191 10/22/2012 1800   MCV 71.4 (L) 07/06/2020 0240   MCV 87 10/22/2012 1800   MCH 18.4 (L) 07/06/2020 0240   MCHC 25.8 (L) 07/06/2020 0240   RDW 21.5 (H) 07/06/2020 0240   RDW 14.3 10/22/2012 1800   LYMPHSABS 1.4 07/05/2020 0541   LYMPHSABS 2.4 10/22/2012 1800   MONOABS 0.1 07/05/2020 0541   MONOABS 1.0 (H) 10/22/2012 1800   EOSABS 0.1 07/05/2020 0541   EOSABS 0.1 10/22/2012 1800   BASOSABS 0.0 07/05/2020 0541   BASOSABS 0.1 10/22/2012 1800    BMET    Component Value Date/Time   NA 144 07/06/2020 0240   K 4.1 07/06/2020 0240   CL 117 (H) 07/06/2020 0240   CO2 19 (L) 07/06/2020 0240   GLUCOSE 125 (H) 07/06/2020 0240   BUN 5 (L) 07/06/2020 0240   CREATININE 0.59 07/06/2020 0240   CALCIUM 8.0 (L) 07/06/2020 0240   GFRNONAA >60 07/06/2020 0240   GFRAA >60 01/04/2018 2056    INR    Component Value Date/Time   INR 1.2 07/04/2020 1337     Intake/Output Summary (Last 24 hours) at 07/06/2020 0716 Last data filed at 07/06/2020 0600 Gross per 24 hour  Intake 4248.66 ml  Output 1400 ml  Net 2848.66 ml     Assessment:  35 y.o. female is s/p:  Right upper extremity thrombectomy including the right radial, ulnar, and brachial artery  1 Day  Post-Op  Plan: -pt with brisk bi/triphasic doppler signals right radial/ulnar.  -intubated and not really following commands but does spontaneously move the right arm.  Right forearm is soft.  -DVT prophylaxis:  AC per neuro team-per neuro, CT reviewed from last night and is unchanged-stroke team to round this am and determine heparinization.     Doreatha Massed, PA-C [facility] [phone] 07/06/2020 7:16 AM   I have seen and evaluated the patient. I agree with the PA note as documented above.  Postop day 1 status post right upper extremity thrombectomy including the brachial, radial and ulnar artery.  She has brisk radial and ulnar signals at the right wrist.  Color in the hand is improved.  Forearm is soft.  She is going for craniotomy with neurosurgery.  Unable to do heparin given hemorrhagic conversion of stroke.  Cephus Shelling, MD [facility] [place] Office: [phone]

## 2020-07-06 NOTE — Progress Notes (Signed)
OT Cancellation Note  Patient Details Name: Brandi Myers MRN: [id number] DOB: [birth year]   Cancelled Treatment:    Reason Eval/Treat Not Completed: Active bedrest order;Medical issues which prohibited therapy. Pt planning for transfer to sx for crani. Will return as schedule allows and pt medically ready. Thank you.  Nialah Saravia M Lemuel Boodram Carolos Fecher MSOT, OTR/L Acute Rehab Pager: [phone] Office: [phone] 07/06/2020, 8:43 AM

## 2020-07-06 NOTE — Progress Notes (Addendum)
SLP Cancellation Note  Patient Details Name: Brandi Myers MRN: [id number] DOB: February 05, 1986   Cancelled treatment:       Reason Eval/Treat Not Completed: Patient not medically ready. Pt remains on vent. Note worsening CT Head last night; thought to be stable CT Head this morning per MD (final read pending). Will continue to follow. Please contact weekend pager [phone]) if needed over the weekend.     Mahala Menghini., M.A. CCC-SLP Acute Rehabilitation Services Pager [phone] Office [phone]  07/06/2020, 7:40 AM

## 2020-07-06 NOTE — Consult Note (Signed)
Reason for Consult: Right cerebral cytotoxic edema with central hemorrhage and midline shift  Referring Physician: Levon Hedger, MD   HPI: Brandi Myers is a 35 y.o. female with a PmHx of HTN and DM2 who presented as a code stroke due to left-sided hemiparesis and dysarthria. On arrival to the ED, the patient was also noted to be altered/combative requiring intubation to obtain CT imaging to rule out stroke.  Head CT demonstrated right ICA occlusion as well as right common carotid bifurcation thrombus resulting in acute stroke.  Patient received TPA in ED at [facility] after which she was transferred to [facility] for further neurological work-up and interventions including thrombectomy. On arrival to [facility] patient underwent bilateral common carotid arteriograms with revascularization of occluded terminal ICA and right MCA and right ACA. The patient's hospital course was complicated with hemorrhagic transformation and right brachial clot requiring thrombectomy.  Serial CT imaging revealed continued progression of the hemorrhagic transformation with 1 cm midline shift and a significant amount of cerebral cytotoxic edema.  She was started on hypertonic saline.  Neurosurgery was then consulted to evaluate for potential decompressive hemicraniectomy.  Past Medical History:  Diagnosis Date  . Diabetes mellitus without complication (HCC)   . Hypertension     Past Surgical History:  Procedure Laterality Date  . CESAREAN SECTION    . CHOLECYSTECTOMY    . IR PERCUTANEOUS ART THROMBECTOMY/INFUSION INTRACRANIAL INC DIAG ANGIO  07/04/2020  . RADIOLOGY WITH ANESTHESIA N/A 07/04/2020   Procedure: IR WITH ANESTHESIA;  Surgeon: Radiologist, Medication, MD;  Location: [facility] OR;  Service: Radiology;  Laterality: N/A;  . THROMBECTOMY BRACHIAL ARTERY Right 07/05/2020   Procedure: RIGHT UPPER EXTREMITY THROMBECTOMY;  Surgeon: Cephus Shelling, MD;  Location: [facility] OR;  Service: Vascular;  Laterality: Right;    Family  History  Problem Relation Age of Onset  . Diabetes Maternal Grandmother     Social History:  reports that she has been smoking cigarettes. She has been smoking about 0.10 packs per day. She has never used smokeless tobacco. She reports current alcohol use. She reports that she does not use drugs.  Allergies:  Allergies  Allergen Reactions  . Norco [Hydrocodone-Acetaminophen] Itching, Nausea And Vomiting and Swelling    Medications: I have reviewed the patient's current medications.  Results for orders placed or performed during the hospital encounter of 07/04/20 (from the past 48 hour(s))  CBG monitoring, ED     Status: Abnormal   Collection Time: 07/04/20  3:46 PM  Result Value Ref Range   Glucose-Capillary 316 (H) 70 - 99 mg/dL    Comment: Glucose reference range applies only to samples taken after fasting for at least 8 hours.  Glucose, capillary     Status: Abnormal   Collection Time: 07/04/20  4:44 PM  Result Value Ref Range   Glucose-Capillary 401 (H) 70 - 99 mg/dL    Comment: Glucose reference range applies only to samples taken after fasting for at least 8 hours.  Glucose, capillary     Status: Abnormal   Collection Time: 07/04/20  5:24 PM  Result Value Ref Range   Glucose-Capillary 348 (H) 70 - 99 mg/dL    Comment: Glucose reference range applies only to samples taken after fasting for at least 8 hours.  HIV Antibody (routine testing w rflx)     Status: None   Collection Time: 07/04/20  6:14 PM  Result Value Ref Range   HIV Screen 4th Generation wRfx Non Reactive Non Reactive  Comment: Performed at [facility], [address], [place], [place] [address]  Hemoglobin A1c     Status: Abnormal   Collection Time: 07/04/20  6:14 PM  Result Value Ref Range   Hgb A1c MFr Bld 10.6 (H) 4.8 - 5.6 %    Comment: (NOTE) Pre diabetes:          5.7%-6.4%  Diabetes:              >6.4%  Glycemic control for   <7.0% adults with diabetes    Mean Plasma Glucose 257.52  mg/dL    Comment: Performed at [facility], [address] [place], [place] [address]  I-STAT 7, (LYTES, BLD GAS, ICA, H+H)     Status: Abnormal   Collection Time: 07/04/20  6:44 PM  Result Value Ref Range   pH, Arterial 7.409 7.350 - 7.450   pCO2 arterial 40.1 32.0 - 48.0 mmHg   pO2, Arterial 155 (H) 83.0 - 108.0 mmHg   Bicarbonate 25.3 20.0 - 28.0 mmol/L   TCO2 27 22 - 32 mmol/L   O2 Saturation 99.0 %   Acid-Base Excess 1.0 0.0 - 2.0 mmol/L   Sodium 140 135 - 145 mmol/L   Potassium 3.1 (L) 3.5 - 5.1 mmol/L   Calcium, Ion 1.12 (L) 1.15 - 1.40 mmol/L   HCT 29.0 (L) 36.0 - 46.0 %   Hemoglobin 9.9 (L) 12.0 - 15.0 g/dL   Patient temperature 44.3 F    Collection site Radial    Drawn by HIDE    Sample type ARTERIAL   Glucose, capillary     Status: Abnormal   Collection Time: 07/04/20  7:54 PM  Result Value Ref Range   Glucose-Capillary 158 (H) 70 - 99 mg/dL    Comment: Glucose reference range applies only to samples taken after fasting for at least 8 hours.  Potassium     Status: None   Collection Time: 07/04/20 11:04 PM  Result Value Ref Range   Potassium 3.6 3.5 - 5.1 mmol/L    Comment: Performed at [facility], [address], [place], [place] [address]  Glucose, capillary     Status: Abnormal   Collection Time: 07/04/20 11:32 PM  Result Value Ref Range   Glucose-Capillary 124 (H) 70 - 99 mg/dL    Comment: Glucose reference range applies only to samples taken after fasting for at least 8 hours.  CK     Status: Abnormal   Collection Time: 07/04/20 11:47 PM  Result Value Ref Range   Total CK 612 (H) 38 - 234 U/L    Comment: Performed at [facility], [address] [place], [place] [address]  Rapid urine drug screen (hospital performed)     Status: Abnormal   Collection Time: 07/05/20 12:10 AM  Result Value Ref Range   Opiates NONE DETECTED NONE DETECTED   Cocaine POSITIVE (A) NONE DETECTED   Benzodiazepines NONE DETECTED NONE DETECTED   Amphetamines  NONE DETECTED NONE DETECTED   Tetrahydrocannabinol POSITIVE (A) NONE DETECTED   Barbiturates NONE DETECTED NONE DETECTED    Comment: (NOTE) DRUG SCREEN FOR MEDICAL PURPOSES ONLY.  IF CONFIRMATION IS NEEDED FOR ANY PURPOSE, NOTIFY LAB WITHIN 5 DAYS.  LOWEST DETECTABLE LIMITS FOR URINE DRUG SCREEN Drug Class                     Cutoff (ng/mL) Amphetamine and metabolites    1000 Barbiturate and metabolites  200 Benzodiazepine                 200 Tricyclics and metabolites     300 Opiates and metabolites        300 Cocaine and metabolites        300 THC                            50 Performed at [facility], [address] [place], [place] [address]   Urinalysis, Complete w Microscopic Urine, Unspecified Source     Status: Abnormal   Collection Time: 07/05/20 12:16 AM  Result Value Ref Range   Color, Urine YELLOW YELLOW   APPearance CLEAR CLEAR   Specific Gravity, Urine >1.046 (H) 1.005 - 1.030   pH 5.0 5.0 - 8.0   Glucose, UA >=500 (A) NEGATIVE mg/dL   Hgb urine dipstick MODERATE (A) NEGATIVE   Bilirubin Urine NEGATIVE NEGATIVE   Ketones, ur 5 (A) NEGATIVE mg/dL   Protein, ur NEGATIVE NEGATIVE mg/dL   Nitrite NEGATIVE NEGATIVE   Leukocytes,Ua NEGATIVE NEGATIVE   RBC / HPF 0-5 0 - 5 RBC/hpf   WBC, UA 0-5 0 - 5 WBC/hpf   Bacteria, UA NONE SEEN NONE SEEN   Squamous Epithelial / LPF 0-5 0 - 5    Comment: Performed at [facility], [address] [place], [place] [address]  Glucose, capillary     Status: Abnormal   Collection Time: 07/05/20  4:28 AM  Result Value Ref Range   Glucose-Capillary 139 (H) 70 - 99 mg/dL    Comment: Glucose reference range applies only to samples taken after fasting for at least 8 hours.  Triglycerides     Status: None   Collection Time: 07/05/20  5:41 AM  Result Value Ref Range   Triglycerides 86 <150 mg/dL    Comment: Performed at [facility], [address], [place], [place] [address]  CBC with Differential/Platelet      Status: Abnormal   Collection Time: 07/05/20  5:41 AM  Result Value Ref Range   WBC 14.2 (H) 4.0 - 10.5 K/uL   RBC 4.62 3.87 - 5.11 MIL/uL   Hemoglobin 8.6 (L) 12.0 - 15.0 g/dL    Comment: Reticulocyte Hemoglobin testing may be clinically indicated, consider ordering this additional test [id number]    HCT 32.0 (L) 36.0 - 46.0 %   MCV 69.3 (L) 80.0 - 100.0 fL   MCH 18.6 (L) 26.0 - 34.0 pg   MCHC 26.9 (L) 30.0 - 36.0 g/dL   RDW 62.1 (H) 30.8 - 65.7 %   Platelets 160 150 - 400 K/uL   nRBC 0.0 0.0 - 0.2 %   Neutrophils Relative % 88 %   Neutro Abs 12.5 (H) 1.7 - 7.7 K/uL   Lymphocytes Relative 10 %   Lymphs Abs 1.4 0.7 - 4.0 K/uL   Monocytes Relative 1 %   Monocytes Absolute 0.1 0.1 - 1.0 K/uL   Eosinophils Relative 1 %   Eosinophils Absolute 0.1 0.0 - 0.5 K/uL   Basophils Relative 0 %   Basophils Absolute 0.0 0.0 - 0.1 K/uL   nRBC 0 0 /100 WBC   Abs Immature Granulocytes 0.00 0.00 - 0.07 K/uL   Polychromasia PRESENT     Comment: Performed at [facility], [address], [place], [place] [address]  Basic metabolic panel     Status: Abnormal   Collection Time:  07/05/20  5:41 AM  Result Value Ref Range   Sodium 141 135 - 145 mmol/L   Potassium 3.1 (L) 3.5 - 5.1 mmol/L   Chloride 107 98 - 111 mmol/L   CO2 25 22 - 32 mmol/L   Glucose, Bld 147 (H) 70 - 99 mg/dL    Comment: Glucose reference range applies only to samples taken after fasting for at least 8 hours.   BUN <5 (L) 6 - 20 mg/dL   Creatinine, Ser 8.29 0.44 - 1.00 mg/dL   Calcium 8.1 (L) 8.9 - 10.3 mg/dL   GFR, Estimated >56 >21 mL/min    Comment: (NOTE) Calculated using the CKD-EPI Creatinine Equation (2021)    Anion gap 9 5 - 15    Comment: Performed at [facility], [address], [place], [place] [address]  Lupus anticoagulant panel     Status: None   Collection Time: 07/05/20  5:41 AM  Result Value Ref Range   PTT Lupus Anticoagulant 29.7 0.0 - 51.9 sec   DRVVT 33.1 0.0 - 47.0 sec   Lupus Anticoag  Interp Comment:     Comment: (NOTE) No lupus anticoagulant was detected. Performed At: [facility] [address] [place], [place] [id number] Jolene Schimke MD [id number]   ANA, IFA (with reflex)     Status: None   Collection Time: 07/05/20  5:41 AM  Result Value Ref Range   ANA Ab, IFA Negative     Comment: (NOTE)                                     Negative   <1:80                                     Borderline  1:80                                     Positive   >1:80 ICAP nomenclature: AC-0 For more information about Hep-2 cell patterns use ANApatterns.org, the official website for the International Consensus on Antinuclear Antibody (ANA) Patterns (ICAP). Performed At: [facility] [address] [place], [place] [id number] Jolene Schimke MD [id number]   C-reactive protein     Status: None   Collection Time: 07/05/20  5:41 AM  Result Value Ref Range   CRP 0.7 <1.0 mg/dL    Comment: Performed at [facility], [address], [place], [place] [address]  Sedimentation rate     Status: None   Collection Time: 07/05/20  5:41 AM  Result Value Ref Range   Sed Rate 6 0 - 22 mm/hr    Comment: Performed at [facility], [address], [place], [place] [address]  Vitamin B12     Status: None   Collection Time: 07/05/20  5:41 AM  Result Value Ref Range   Vitamin B-12 356 180 - 914 pg/mL    Comment: (NOTE) This assay is not validated for testing neonatal or myeloproliferative syndrome specimens for Vitamin B12 levels. Performed at [facility], [address], [place], [place] [address]   TSH     Status: Abnormal   Collection Time: 07/05/20  5:41 AM  Result Value Ref Range   TSH 0.307 (L) 0.350 -  4.500 uIU/mL    Comment: Performed by a 3rd Generation assay with a functional sensitivity of <=0.01 uIU/mL. Performed at [facility], [address], [place], [place] [address]   RPR     Status: None   Collection Time: 07/05/20  5:41  AM  Result Value Ref Range   RPR Ser Ql NON REACTIVE NON REACTIVE    Comment: Performed at [facility], [address] [place], [place] [address]  Lipid panel     Status: Abnormal   Collection Time: 07/05/20  5:41 AM  Result Value Ref Range   Cholesterol 154 0 - 200 mg/dL   Triglycerides 79 <098 mg/dL   HDL 39 (L) >11 mg/dL   Total CHOL/HDL Ratio 3.9 RATIO   VLDL 16 0 - 40 mg/dL   LDL Cholesterol 99 0 - 99 mg/dL    Comment:        Total Cholesterol/HDL:CHD Risk Coronary Heart Disease Risk Table                     Men   Women  1/2 Average Risk   3.4   3.3  Average Risk       5.0   4.4  2 X Average Risk   9.6   7.1  3 X Average Risk  23.4   11.0        Use the calculated Patient Ratio above and the CHD Risk Table to determine the patient's CHD Risk.        ATP III CLASSIFICATION (LDL):  <100     mg/dL   Optimal  914-782  mg/dL   Near or Above                    Optimal  130-159  mg/dL   Borderline  956-213  mg/dL   High  >086     mg/dL   Very High Performed at [facility], [address] [place], [place] [address]   Glucose, capillary     Status: Abnormal   Collection Time: 07/05/20  7:44 AM  Result Value Ref Range   Glucose-Capillary 156 (H) 70 - 99 mg/dL    Comment: Glucose reference range applies only to samples taken after fasting for at least 8 hours.  Glucose, capillary     Status: Abnormal   Collection Time: 07/05/20  7:44 AM  Result Value Ref Range   Glucose-Capillary 156 (H) 70 - 99 mg/dL    Comment: Glucose reference range applies only to samples taken after fasting for at least 8 hours.  T4, free     Status: Abnormal   Collection Time: 07/05/20  8:53 AM  Result Value Ref Range   Free T4 1.27 (H) 0.61 - 1.12 ng/dL    Comment: (NOTE) Biotin ingestion may interfere with free T4 tests. If the results are inconsistent with the TSH level, previous test results, or the clinical presentation, then consider biotin interference. If needed, order  repeat testing after stopping biotin. Performed at [facility], [address], [place], [place] [address]   Glucose, capillary     Status: Abnormal   Collection Time: 07/05/20 11:17 AM  Result Value Ref Range   Glucose-Capillary 170 (H) 70 - 99 mg/dL    Comment: Glucose reference range applies only to samples taken after fasting for at least 8 hours.  MRSA PCR Screening     Status: None   Collection Time: 07/05/20  2:45 PM   Specimen: Nasal Mucosa; Nasopharyngeal  Result Value Ref Range   MRSA by PCR NEGATIVE NEGATIVE    Comment:        The GeneXpert MRSA Assay (FDA approved for NASAL specimens only), is one component of a comprehensive MRSA colonization surveillance program. It is not intended to diagnose MRSA infection nor to guide or monitor treatment for MRSA infections. Performed at [facility], [address], [place], [place] [address]   Glucose, capillary     Status: Abnormal   Collection Time: 07/05/20  3:33 PM  Result Value Ref Range   Glucose-Capillary 155 (H) 70 - 99 mg/dL    Comment: Glucose reference range applies only to samples taken after fasting for at least 8 hours.  Glucose, capillary     Status: None   Collection Time: 07/05/20  7:18 PM  Result Value Ref Range   Glucose-Capillary 81 70 - 99 mg/dL    Comment: Glucose reference range applies only to samples taken after fasting for at least 8 hours.  Glucose, capillary     Status: Abnormal   Collection Time: 07/05/20 11:20 PM  Result Value Ref Range   Glucose-Capillary 326 (H) 70 - 99 mg/dL    Comment: Glucose reference range applies only to samples taken after fasting for at least 8 hours.  Basic metabolic panel     Status: Abnormal   Collection Time: 07/06/20  2:40 AM  Result Value Ref Range   Sodium 144 135 - 145 mmol/L   Potassium 4.1 3.5 - 5.1 mmol/L    Comment: DELTA CHECK NOTED   Chloride 117 (H) 98 - 111 mmol/L   CO2 19 (L) 22 - 32 mmol/L   Glucose, Bld 125 (H) 70 - 99 mg/dL     Comment: Glucose reference range applies only to samples taken after fasting for at least 8 hours.   BUN 5 (L) 6 - 20 mg/dL   Creatinine, Ser 2.53 0.44 - 1.00 mg/dL   Calcium 8.0 (L) 8.9 - 10.3 mg/dL   GFR, Estimated >66 >44 mL/min    Comment: (NOTE) Calculated using the CKD-EPI Creatinine Equation (2021)    Anion gap 8 5 - 15    Comment: Performed at [facility], [address], [place], [place] [address]  Magnesium     Status: None   Collection Time: 07/06/20  2:40 AM  Result Value Ref Range   Magnesium 2.1 1.7 - 2.4 mg/dL    Comment: Performed at [facility], [address], [place], [place] [address]  CBC     Status: Abnormal   Collection Time: 07/06/20  2:40 AM  Result Value Ref Range   WBC 12.7 (H) 4.0 - 10.5 K/uL   RBC 4.02 3.87 - 5.11 MIL/uL   Hemoglobin 7.4 (L) 12.0 - 15.0 g/dL    Comment: Reticulocyte Hemoglobin testing may be clinically indicated, consider ordering this additional test [id number]    HCT 28.7 (L) 36.0 - 46.0 %   MCV 71.4 (L) 80.0 - 100.0 fL   MCH 18.4 (L) 26.0 - 34.0 pg   MCHC 25.8 (L) 30.0 - 36.0 g/dL   RDW 29.5 (H) 18.8 - 41.6 %   Platelets 123 (L) 150 - 400 K/uL   nRBC 0.0 0.0 - 0.2 %    Comment: Performed at [facility], [address], [place], [place] [address]  Phosphorus     Status: Abnormal   Collection Time: 07/06/20  2:40 AM  Result Value Ref Range   Phosphorus 2.1 (L) 2.5 - 4.6 mg/dL    Comment: Performed at [facility], [address], [place], [place] [address]  Glucose, capillary     Status: Abnormal   Collection Time: 07/06/20  3:26 AM  Result Value Ref Range   Glucose-Capillary 127 (H) 70 - 99 mg/dL    Comment: Glucose reference range applies only to samples taken after fasting for at least 8 hours.  Glucose, capillary     Status: Abnormal   Collection Time: 07/06/20  7:48 AM  Result Value Ref Range   Glucose-Capillary 120 (H) 70 - 99 mg/dL    Comment: Glucose reference range applies only to samples  taken after fasting for at least 8 hours.    CT Angio Head W/Cm &/Or Wo Cm  Result Date: 07/04/2020 CLINICAL DATA:  Left-sided weakness and slurred speech EXAM: CT HEAD WITHOUT CONTRAST CT ANGIOGRAPHY HEAD AND NECK CT PERFUSION BRAIN TECHNIQUE: Multi detector CT imaging of the head was performed using the standard protocol without intravenous contrast. Multidetector CT imaging of the head and neck was performed using the standard protocol during bolus administration of intravenous contrast. Multiplanar CT image reconstructions and MIPs were obtained to evaluate the vascular anatomy. Carotid stenosis measurements (when applicable) are obtained utilizing NASCET criteria, using the distal internal carotid diameter as the denominator. Multiphase CT imaging of the brain was performed following IV bolus contrast injection. Subsequent parametric perfusion maps were calculated using RAPID software. CONTRAST:  OMNIPAQUE IOHEXOL 350 MG/ML SOLN COMPARISON:  None. FINDINGS: CT HEAD FINDINGS Brain: There is no acute intracranial hemorrhage or mass effect. Loss of gray-white differentiation involving a portion of the right lentiform nucleus. Ventricles and sulci are normal in size and configuration. No extra-axial collection. Vascular: No hyperdense vessel. Skull: Unremarkable. Sinuses/Orbits: Lobular maxillary sinus mucosal thickening. Orbits are unremarkable. Other: Minor patchy left mastoid tip opacification. ASPECTS [facility] Stroke Program Early CT Score) - Ganglionic level infarction (caudate, lentiform nuclei, internal capsule, insula, M1-M3 cortex): 6 - Supraganglionic infarction (M4-M6 cortex): 3 Total score (0-10 with 10 being normal): 9 CTA NECK FINDINGS Aortic arch: Normal caliber aorta.  Great vessel origins are patent. Right carotid system: Common carotid is patent. There is thrombus at the bifurcation extending into the external carotid. Less than 50% stenosis at the ICA origin. The cervical ICA is  relatively smaller in caliber. Left carotid system: Patent.  No stenosis. Vertebral arteries: Patent. Right vertebral artery slightly dominant. No stenosis. Skeleton: Unremarkable. Other neck: Thyroid not well evaluated due to streak artifact. Likely reactive cervical lymph nodes. Upper chest: Included lungs are clear. Endotracheal tube is present. Review of the MIP images confirms the above findings CTA HEAD FINDINGS Anterior circulation: Proximal intracranial internal carotid artery is patent. There is loss of enhancement at the level of the supraclinoid portion. Faint enhancement is seen along the right M1 MCA. There is reconstitution of M2 branches. Patent right A1 ACA. Anterior communicating artery is present. Left anterior and middle cerebral arteries are patent. Posterior circulation: Intracranial vertebral arteries, basilar artery, and posterior cerebral arteries are patent. Venous sinuses: As permitted by contrast timing, patent. Review of the MIP images confirms the above findings CT Brain Perfusion Findings: CBF (<30%) Volume: 51mL Perfusion (Tmax>6.0s) volume: Mismatch Volume: 63mL Infarction Location: Right MCA territory IMPRESSION: No acute intracranial hemorrhage. 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 Thrombus at the right common carotid bifurcation extending into the external carotid  with less than 50% narrowing of the ICA origin. 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 Reconstitution of right MCA at the bifurcation. Right A1 ACA is patent. Anterior communicating artery is present. No posterior communicating artery is identified. Perfusion imaging demonstrates core infarction of 51 mL. This is greater than anticipated by noncontrast CT. Penumbra is 63 mL and therefore may be greater than calculated. Initial results were called by telephone at the time of interpretation on 07/04/2020 at 1:58 pm to provider  Dr. Iver Nestle, who verbally acknowledged these results. Electronically Signed   By: Guadlupe Spanish M.D.   On: 07/04/2020 14:10   DG Chest 1 View  Result Date: 07/04/2020 CLINICAL DATA:  Intubation EXAM: CHEST  1 VIEW COMPARISON:  01/12/2016 FINDINGS: Endotracheal tube is 3.6 cm above the carina. NG tube is in the stomach. No confluent opacities or effusions. Heart is normal size. No acute bony abnormality. IMPRESSION: Support devices in expected position. No acute cardiopulmonary disease. Electronically Signed   By: Charlett Nose M.D.   On: 07/04/2020 15:09   CT HEAD WO CONTRAST  Result Date: 07/06/2020 CLINICAL DATA:  Follow-up intracranial hemorrhage EXAM: CT HEAD WITHOUT CONTRAST TECHNIQUE: Contiguous axial images were obtained from the base of the skull through the vertex without intravenous contrast. COMPARISON:  Yesterday FINDINGS: Brain: Cytotoxic edema involving cortex and basal ganglia in the right MCA territory with hazy central hemorrhage that is non progressed. Midline shift measures up to 1 cm. Unchanged dilatation of the left lateral ventricle attributed to the degree of midline shift and mass effect, with transependymal flow. Vascular: Stable Skull: Stable Sinuses/Orbits: Nasal septal perforation and patchy sinus opacification. IMPRESSION: 1. Unchanged right cerebral cytotoxic edema with central hemorrhage. Midline shift is similar at 1 cm. 2. Unchanged left lateral ventricle dilatation with evidence of transependymal flow. Electronically Signed   By: Marnee Spring M.D.   On: 07/06/2020 08:19   CT HEAD WO CONTRAST  Result Date: 07/05/2020 CLINICAL DATA:  Follow-up examination for acute stroke. EXAM: CT HEAD WITHOUT CONTRAST TECHNIQUE: Contiguous axial images were obtained from the base of the skull through the vertex without intravenous contrast. COMPARISON:  Comparison made with prior MRI from earlier the same day. FINDINGS: Brain: Continued interval evolution of right MCA distribution  infarct, stable from prior MRI. Associated hemorrhagic transformation with hyperdense blood products throughout the area of infarction. Worsened regional mass effect and edema, with progressive effacement of the right lateral ventricle. Associated right-to-left midline shift has increased now measuring up to 9 mm. Increased dilatation of the right lateral ventricle concerning for developing ventricular trapping. Basilar cisterns remain patent. No transtentorial herniation. No other acute large vessel territory infarct or hemorrhage. No extra-axial collection. Vascular: No definite hyperdense vessel. Skull: Scalp soft tissues and calvarium within normal limits. Sinuses/Orbits: Visualized globes and orbital soft tissues within normal limits. Scattered mucosal thickening throughout the ethmoidal air cells, maxillary sinuses, and sphenoid sinuses. Small bilateral mastoid effusions noted. Other: None. IMPRESSION: 1. Continued interval evolution of right MCA distribution infarct with associated hemorrhagic transformation. Worsened regional mass effect and edema with increased right-to-left midline shift now measuring up to 9 mm. Increased dilatation of the right lateral ventricle concerning for developing ventricular trapping. 2. No other new acute intracranial abnormality. Critical Value/emergent results were called by telephone at the time of interpretation on 07/05/2020 at 11:05 pm to provider Dr. Wilford Corner, who verbally acknowledged these results. Electronically Signed   By: Rise Mu M.D.   On: 07/05/2020  23:06   CT Angio Neck W and/or Wo Contrast  Result Date: 07/04/2020 CLINICAL DATA:  Left-sided weakness and slurred speech EXAM: CT HEAD WITHOUT CONTRAST CT ANGIOGRAPHY HEAD AND NECK CT PERFUSION BRAIN TECHNIQUE: Multi detector CT imaging of the head was performed using the standard protocol without intravenous contrast. Multidetector CT imaging of the head and neck was performed using the standard  protocol during bolus administration of intravenous contrast. Multiplanar CT image reconstructions and MIPs were obtained to evaluate the vascular anatomy. Carotid stenosis measurements (when applicable) are obtained utilizing NASCET criteria, using the distal internal carotid diameter as the denominator. Multiphase CT imaging of the brain was performed following IV bolus contrast injection. Subsequent parametric perfusion maps were calculated using RAPID software. CONTRAST:  OMNIPAQUE IOHEXOL 350 MG/ML SOLN COMPARISON:  None. FINDINGS: CT HEAD FINDINGS Brain: There is no acute intracranial hemorrhage or mass effect. Loss of gray-white differentiation involving a portion of the right lentiform nucleus. Ventricles and sulci are normal in size and configuration. No extra-axial collection. Vascular: No hyperdense vessel. Skull: Unremarkable. Sinuses/Orbits: Lobular maxillary sinus mucosal thickening. Orbits are unremarkable. Other: Minor patchy left mastoid tip opacification. ASPECTS [facility] Stroke Program Early CT Score) - Ganglionic level infarction (caudate, lentiform nuclei, internal capsule, insula, M1-M3 cortex): 6 - Supraganglionic infarction (M4-M6 cortex): 3 Total score (0-10 with 10 being normal): 9 CTA NECK FINDINGS Aortic arch: Normal caliber aorta.  Great vessel origins are patent. Right carotid system: Common carotid is patent. There is thrombus at the bifurcation extending into the external carotid. Less than 50% stenosis at the ICA origin. The cervical ICA is relatively smaller in caliber. Left carotid system: Patent.  No stenosis. Vertebral arteries: Patent. Right vertebral artery slightly dominant. No stenosis. Skeleton: Unremarkable. Other neck: Thyroid not well evaluated due to streak artifact. Likely reactive cervical lymph nodes. Upper chest: Included lungs are clear. Endotracheal tube is present. Review of the MIP images confirms the above findings CTA HEAD FINDINGS Anterior circulation:  Proximal intracranial internal carotid artery is patent. There is loss of enhancement at the level of the supraclinoid portion. Faint enhancement is seen along the right M1 MCA. There is reconstitution of M2 branches. Patent right A1 ACA. Anterior communicating artery is present. Left anterior and middle cerebral arteries are patent. Posterior circulation: Intracranial vertebral arteries, basilar artery, and posterior cerebral arteries are patent. Venous sinuses: As permitted by contrast timing, patent. Review of the MIP images confirms the above findings CT Brain Perfusion Findings: CBF (<30%) Volume: 51mL Perfusion (Tmax>6.0s) volume: Mismatch Volume: 63mL Infarction Location: Right MCA territory IMPRESSION: No acute intracranial hemorrhage. 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 Reconstitution of right MCA at the bifurcation. Right A1 ACA is patent. Anterior communicating artery is present. No posterior communicating artery is identified. Perfusion imaging demonstrates core infarction of 51 mL. This is greater than anticipated by noncontrast CT. Penumbra is 63 mL and therefore may be greater than calculated. Initial results were called by telephone at the time of interpretation on 07/04/2020 at 1:58 pm to provider Dr. Iver Nestle, who verbally acknowledged these results. Electronically Signed   By: Guadlupe Spanish M.D.   On: 07/04/2020 14:10   MR ANGIO HEAD WO CONTRAST  Result Date: 07/05/2020 CLINICAL DATA:  Stroke.  Post thrombectomy and tPA EXAM: MRI HEAD  WITHOUT CONTRAST MRA HEAD WITHOUT CONTRAST TECHNIQUE: Multiplanar, multiecho pulse sequences of the brain and surrounding structures were obtained without intravenous contrast. Angiographic images of  the head were obtained using MRA technique without contrast. COMPARISON:  CT angio head and neck 07/04/2020 FINDINGS: MRI HEAD FINDINGS Brain: Acute infarct right MCA territory. This involves much of the right basal ganglia as well as the right frontal and temporal lobe. There is hemorrhagic transformation with mild-to-moderate blood in the infarct. There is local mass-effect and midline shift of 6 mm. No hydrocephalus. No evidence of chronic ischemia. No mass lesion. Vascular: Normal arterial flow voids at the base of brain. Skull and upper cervical spine: No focal skeletal abnormality. Sinuses/Orbits: Moderate mucosal edema paranasal sinuses. Bilateral mastoid effusion normal orbit Other: None MRA HEAD FINDINGS Both vertebral arteries are normal. Left PICA patent. Large right AICA patent. Basilar patent. Superior cerebellar and posterior cerebral arteries normal bilaterally. Internal carotid artery is widely patent bilaterally without stenosis or filling defect. Anterior and middle cerebral arteries widely patent bilaterally without stenosis or occlusion. No aneurysm. 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 Hemorrhagic transformation with mild to moderate hemorrhage in the infarct. There is mass-effect and 6 mm midline shift to left Negative MRA head Electronically Signed   By: Marlan Palau M.D.   On: 07/05/2020 10:01   MR BRAIN WO CONTRAST  Result Date: 07/05/2020 CLINICAL DATA:  Stroke.  Post thrombectomy and tPA EXAM: MRI HEAD WITHOUT CONTRAST MRA HEAD WITHOUT CONTRAST TECHNIQUE: Multiplanar, multiecho pulse sequences of the brain and surrounding structures were obtained without intravenous contrast. Angiographic images of the head were obtained using MRA technique without contrast. COMPARISON:  CT angio head and neck 07/04/2020 FINDINGS: MRI HEAD FINDINGS Brain: Acute infarct right MCA territory. This involves much of the right basal ganglia as  well as the right frontal and temporal lobe. There is hemorrhagic transformation with mild-to-moderate blood in the infarct. There is local mass-effect and midline shift of 6 mm. No hydrocephalus. No evidence of chronic ischemia. No mass lesion. Vascular: Normal arterial flow voids at the base of brain. Skull and upper cervical spine: No focal skeletal abnormality. Sinuses/Orbits: Moderate mucosal edema paranasal sinuses. Bilateral mastoid effusion normal orbit Other: None MRA HEAD FINDINGS Both vertebral arteries are normal. Left PICA patent. Large right AICA patent. Basilar patent. Superior cerebellar and posterior cerebral arteries normal bilaterally. Internal carotid artery is widely patent bilaterally without stenosis or filling defect. Anterior and middle cerebral arteries widely patent bilaterally without stenosis or occlusion. No aneurysm. 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 Hemorrhagic transformation with mild to moderate hemorrhage in the infarct. There is mass-effect and 6 mm midline shift to left Negative MRA head Electronically Signed   By: Marlan Palau M.D.   On: 07/05/2020 10:01   CT CEREBRAL PERFUSION W CONTRAST  Result Date: 07/04/2020 CLINICAL DATA:  Left-sided weakness and slurred speech EXAM: CT HEAD WITHOUT CONTRAST CT ANGIOGRAPHY HEAD AND NECK CT PERFUSION BRAIN TECHNIQUE: Multi detector CT imaging of the head was performed using the standard protocol without intravenous contrast. Multidetector CT imaging of the head and neck was performed using the standard protocol during bolus administration of intravenous contrast. Multiplanar CT image reconstructions and MIPs were obtained to evaluate the vascular anatomy. Carotid stenosis measurements (when applicable) are obtained utilizing NASCET criteria, using the distal internal carotid diameter as the denominator. Multiphase CT imaging of the brain was performed following IV bolus  contrast injection. Subsequent parametric perfusion maps were calculated using RAPID software. CONTRAST:  OMNIPAQUE IOHEXOL 350 MG/ML SOLN COMPARISON:  None. FINDINGS: CT HEAD FINDINGS Brain: There is no acute intracranial hemorrhage or mass effect. Loss of gray-white differentiation involving a portion of the right lentiform nucleus. Ventricles and sulci are normal in size and configuration. No extra-axial collection. Vascular: No hyperdense vessel. Skull: Unremarkable. Sinuses/Orbits: Lobular maxillary sinus mucosal thickening. Orbits are unremarkable. Other: Minor patchy left mastoid tip opacification. ASPECTS [facility] Stroke Program Early CT Score) - Ganglionic level infarction (caudate, lentiform nuclei, internal capsule, insula, M1-M3 cortex): 6 - Supraganglionic infarction (M4-M6 cortex): 3 Total score (0-10 with 10 being normal): 9 CTA NECK FINDINGS Aortic arch: Normal caliber aorta.  Great vessel origins are patent. Right carotid system: Common carotid is patent. There is thrombus at the bifurcation extending into the external carotid. Less than 50% stenosis at the ICA origin. The cervical ICA is relatively smaller in caliber. Left carotid system: Patent.  No stenosis. Vertebral arteries: Patent. Right vertebral artery slightly dominant. No stenosis. Skeleton: Unremarkable. Other neck: Thyroid not well evaluated due to streak artifact. Likely reactive cervical lymph nodes. Upper chest: Included lungs are clear. Endotracheal tube is present. Review of the MIP images confirms the above findings CTA HEAD FINDINGS Anterior circulation: Proximal intracranial internal carotid artery is patent. There is loss of enhancement at the level of the supraclinoid portion. Faint enhancement is seen along the right M1 MCA. There is reconstitution of M2 branches. Patent right A1 ACA. Anterior communicating artery is present. Left anterior and middle cerebral arteries are patent. Posterior circulation: Intracranial  vertebral arteries, basilar artery, and posterior cerebral arteries are patent. Venous sinuses: As permitted by contrast timing, patent. Review of the MIP images confirms the above findings CT Brain Perfusion Findings: CBF (<30%) Volume: 51mL Perfusion (Tmax>6.0s) volume: Mismatch Volume: 63mL Infarction Location: Right MCA territory IMPRESSION: No acute intracranial hemorrhage. 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 Reconstitution of right MCA at the bifurcation. Right A1 ACA is patent. Anterior communicating artery is present. No posterior communicating artery is identified. Perfusion imaging demonstrates core infarction of 51 mL. This is greater than anticipated by noncontrast CT. Penumbra is 63 mL and therefore may be greater than calculated. Initial results were called by telephone at the time of interpretation on 07/04/2020 at 1:58 pm to provider Dr. Iver Nestle, who verbally acknowledged these results. Electronically Signed   By: Guadlupe Spanish M.D.   On: 07/04/2020 14:10   DG Abd Portable 1 View  Result Date: 07/04/2020 CLINICAL DATA:  OG tube placement. EXAM: PORTABLE ABDOMEN - 1 VIEW COMPARISON:  No prior. FINDINGS: OG tube noted with tip over the stomach. No gastric or bowel distention noted. Contrast noted both kidneys. Degenerative change lumbar spine. Surgical clips right upper quadrant. IMPRESSION: OG tube noted with tip over the stomach. No gastric or bowel distention noted. Electronically Signed   By: Maisie Fus  Register   On: 07/04/2020 15:13   VAS [place] UPPER EXTREMITY ARTERIAL DUPLEX  Result Date: 07/06/2020 UPPER EXTREMITY DUPLEX STUDY Indications: Patient complains of RUE - cool to touch.  Risk Factors: Prior CVA. Performing  Technologist: Ernestene Mention  Examination Guidelines: A complete evaluation includes B-mode imaging, spectral Doppler, color Doppler, and power Doppler as needed of all accessible  portions of each vessel. Bilateral testing is considered an integral part of a complete examination. Limited examinations for reoccurring indications may be performed as noted.  Right Doppler Findings: +---------------+----------+----------+--------+--------+ Site           PSV (cm/s)Waveform  StenosisComments +---------------+----------+----------+--------+--------+ Subclavian Prox81        triphasic                  +---------------+----------+----------+--------+--------+ Subclavian Dist          absent                     +---------------+----------+----------+--------+--------+ Axillary       63        triphasic                  +---------------+----------+----------+--------+--------+ Brachial Prox  51        biphasic                   +---------------+----------+----------+--------+--------+ Brachial Mid   33        biphasic                   +---------------+----------+----------+--------+--------+ Brachial Dist  14        monophasicoccluded         +---------------+----------+----------+--------+--------+ Radial Prox    0         absent    occluded         +---------------+----------+----------+--------+--------+ Radial Mid     0         absent    occluded         +---------------+----------+----------+--------+--------+ Radial Dist    0         absent    occluded         +---------------+----------+----------+--------+--------+ Ulnar Prox     0         absent    occluded         +---------------+----------+----------+--------+--------+ Ulnar Mid      0         absent                     +---------------+----------+----------+--------+--------+ Ulnar Dist     0         absent                     +---------------+----------+----------+--------+--------+  Incidental finding: RUE cephalic SVT.   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 Vascular consult recommended.    Preliminary    ECHOCARDIOGRAM COMPLETE  Result Date: 07/05/2020    ECHOCARDIOGRAM REPORT   Patient Name:   CHANCI OJALA Belinsky Date of Exam: 07/05/2020 Medical Rec #:  [id number]       Height:       65.0 in Accession #:    [id number]      Weight:       159.2 lb Date of Birth:  Sep 05, 1985      BSA:          1.795 m Patient Age:    34 years        BP:           130/91 mmHg Patient Gender: F               HR:  115 bpm. Exam Location:  Inpatient Procedure: 2D Echo, Cardiac Doppler and Color Doppler Indications:    Stroke 434.91  History:        Patient has no prior history of Echocardiogram examinations.                 Risk Factors:Hypertension and Diabetes.  Sonographer:    Elmarie Shiley Dance Referring Phys: [id number] ERIC LINDZEN IMPRESSIONS  1. Left ventricular ejection fraction, by estimation, is 60 to 65%. The left ventricle has normal function. The left ventricle has no regional wall motion abnormalities. Left ventricular diastolic parameters are consistent with Grade I diastolic dysfunction (impaired relaxation).  2. Right ventricular systolic function is normal. The right ventricular size is normal.  3. The mitral valve is normal in structure. Trivial mitral valve regurgitation. No evidence of mitral stenosis.  4. The aortic valve is normal in structure. Aortic valve regurgitation is not visualized. No aortic stenosis is present.  5. The inferior vena cava is normal in size with greater than 50% respiratory variability, suggesting right atrial pressure of 3 mmHg. Conclusion(s)/Recommendation(s): No intracardiac source of embolism detected on this transthoracic study. A transesophageal echocardiogram is recommended to exclude cardiac source of embolism if clinically indicated. FINDINGS   Left Ventricle: Left ventricular ejection fraction, by estimation, is 60 to 65%. The left ventricle has normal function. The left ventricle has no regional wall motion abnormalities. The left ventricular internal cavity size was normal in size. There is  no left ventricular hypertrophy. Left ventricular diastolic parameters are consistent with Grade I diastolic dysfunction (impaired relaxation). Normal left ventricular filling pressure. Right Ventricle: The right ventricular size is normal. No increase in right ventricular wall thickness. Right ventricular systolic function is normal. Left Atrium: Left atrial size was normal in size. Right Atrium: Right atrial size was normal in size. Pericardium: There is no evidence of pericardial effusion. Mitral Valve: The mitral valve is normal in structure. Trivial mitral valve regurgitation. No evidence of mitral valve stenosis. Tricuspid Valve: The tricuspid valve is normal in structure. Tricuspid valve regurgitation is trivial. No evidence of tricuspid stenosis. Aortic Valve: The aortic valve is normal in structure. Aortic valve regurgitation is not visualized. No aortic stenosis is present. Pulmonic Valve: The pulmonic valve was normal in structure. Pulmonic valve regurgitation is not visualized. No evidence of pulmonic stenosis. Aorta: The aortic root is normal in size and structure. Venous: The inferior vena cava is normal in size with greater than 50% respiratory variability, suggesting right atrial pressure of 3 mmHg. IAS/Shunts: No atrial level shunt detected by color flow Doppler.  LEFT VENTRICLE PLAX 2D LVIDd:         4.20 cm  Diastology LVIDs:         3.20 cm  LV e' medial:    10.30 cm/s LV PW:         0.90 cm  LV E/e' medial:  8.5 LV IVS:        0.80 cm  LV e' lateral:   15.00 cm/s LVOT diam:     2.10 cm  LV E/e' lateral: 5.9 LV SV:         55 LV SV Index:   31 LVOT Area:     3.46 cm  RIGHT VENTRICLE             IVC RV Basal diam:  2.40 cm     IVC diam: 1.40 cm  RV S prime:     13.40 cm/s TAPSE (M-mode): 2.2 cm LEFT  ATRIUM             Index       RIGHT ATRIUM          Index LA diam:        2.70 cm 1.50 cm/m  RA Area:     8.48 cm LA Vol (A2C):   27.5 ml 15.32 ml/m RA Volume:   17.50 ml 9.75 ml/m LA Vol (A4C):   18.7 ml 10.42 ml/m LA Biplane Vol: 23.2 ml 12.92 ml/m  AORTIC VALVE LVOT Vmax:   99.20 cm/s LVOT Vmean:  66.100 cm/s LVOT VTI:    0.160 m  AORTA Ao Root diam: 3.20 cm Ao Asc diam:  2.60 cm MITRAL VALVE MV Area (PHT): 2.97 cm    SHUNTS MV Decel Time: 256 msec    Systemic VTI:  0.16 m MV E velocity: 88.05 cm/s  Systemic Diam: 2.10 cm Tobias Alexander MD Electronically signed by Tobias Alexander MD Signature Date/Time: 07/05/2020/12:43:53 PM    Final    IR PERCUTANEOUS ART THROMBECTOMY/INFUSION INTRACRANIAL INC DIAG ANGIO  Result Date: 07/06/2020 INDICATION: New onset of left-sided weakness, right gaze deviation and slurred speech. 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 EXAM: 1. EMERGENT LARGE VESSEL OCCLUSION THROMBOLYSIS (anterior CIRCULATION) COMPARISON:  CT angiogram of the head and neck of July 04, 2020. MEDICATIONS: Ancef 2 g IV antibiotic was administered within 1 hour of the procedure. ANESTHESIA/SEDATION: General anesthesia CONTRAST:  Isovue 300 105 mL. FLUOROSCOPY TIME:  Fluoroscopy Time: 24 minutes 0 seconds (1208 mGy). COMPLICATIONS: None immediate. TECHNIQUE: Two physician emergency consent was obtained in view of the non availability of patient's family or next of kin physically or via phone. The patient was then put under general anesthesia by the Department of Anesthesiology at [facility]. The right groin groin was prepped and draped in the usual sterile fashion. Thereafter using modified Seldinger technique, transfemoral access into the right common femoral artery was obtained without difficulty. Over a 0.035  inch guidewire an 8 French 25 cm Pinnacle sheath was inserted. Through this, and also over a 0.035 inch guidewire a 5 [place] JB 1 catheter was advanced to the aortic arch region and selectively positioned in the left common carotid artery, the right vertebral artery and the right common carotid artery. FINDINGS: The left common carotid arteriogram demonstrates the left external carotid artery and its major branches to be widely patent. The left internal carotid artery at the bulb to the cranial skull base is widely patent. The petrous, the cavernous and the supraclinoid segments are widely patent. There is an approximately 3 mm outpouching in the left posterior communicating artery region which may represent an infundibulum versus an aneurysm. The left middle cerebral artery and the left anterior cerebral artery opacify into the capillary and venous phases. Prompt cross-filling via the anterior communicating artery of the right anterior cerebral A2 segment and distally, and the right anterior cerebral A1 segment and the distal A2 region is noted. The right common carotid arteriogram demonstrates the right external carotid artery to be occluded. There is a meniscal filling defect right at the origin of the right external carotid artery. Minimal extrusion was seen into the right internal carotid artery at the bulb. More distally the right internal carotid artery is seen to opacify to the cranial skull base. Patency is seen of the petrous and the caval and proximal cavernous  segments. 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 The right vertebral artery origin is widely patent. The vessel is seen to opacify to the cranial skull base. More distally patency is seen of the right vertebrobasilar junction and the right posterior-inferior cerebellar artery. The basilar artery, the posterior cerebral arteries, the superior cerebellar arteries and the anterior-inferior  cerebellar arteries demonstrate wide patency into the capillary and venous phases. 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 PROCEDURE: The diagnostic JB 1 catheter in right common carotid artery was then exchanged over a 0.035 inch 300 cm Rosen exchange guidewire for an 087 balloon guide catheter which was positioned in the mid cervical right ICA. The guidewire was removed. Good aspiration obtained from the hub of the balloon guide catheter. A control arteriogram performed through this demonstrated no evidence of spasms, dissections or of intraluminal filling defects. Over a 0.014 inch standard Synchro micro guidewire with a J configuration the combination of an 071 136 cm Zoom aspiration catheter inside of which was an 021 162 cm Trevo ProVue microcatheter was advanced to the level of the ipsilateral ophthalmic artery. Thereafter, the micro guidewire was gently manipulated without difficulty through the occluded right internal carotid artery supraclinoid segment into the right middle cerebral artery and into the M2 M3 segment of the inferior division of the right middle cerebral artery. This was followed by advancement of the microcatheter. The guidewire was removed. Good aspiration obtained from the hub of the microcatheter. A gentle control arteriogram performed through this demonstrated antegrade flow into the distal M3 M4 segment of the inferior division of the right middle cerebral artery. This was then connected to continuous heparinized saline infusion. A 5 mm x 37 mm Embotrap retrieval device was then advanced to the distal end of the microcatheter and deployed in the usual manner. The Zoom aspiration catheter was advanced such that it was in the distal occluded right middle cerebral artery. With constant aspiration being applied at the hub of the Zoom aspiration catheter with a Penumbra aspiration device, and with a  20 mL syringe at the hub of the balloon guide, and proximal flow arrest over 2 minutes, the combination of the retrieval device, microcatheter, and the Zoom aspiration catheter were retrieved and removed. Following reversal of flow arrest, a control arteriogram performed through the balloon guide in the right internal carotid artery demonstrated complete revascularization of the occluded right internal carotid artery supraclinoid segment, the right middle cerebral artery superior and inferior anterior temporal branches. However, noted were occlusions of the M3 region of the superior division in its two perisylvian branches, and also of the inferior division in the distal M3 segment. Spasm in the right internal carotid artery was treated with 2 aliquots of 25 mcg of nitroglycerin with relief of the vasospasm. Control arteriogram performed through the balloon guide in the right internal carotid artery demonstrated continued patency of the superior and the inferior divisions with no change in the occlusion of the 2 anterior perisylvian branches of the superior division, and also of the inferior division of the M3 region which over a period of time demonstrated improved revascularization into the M4 regions. A TICI 2b revascularization was achieved. Given the gradual revascularization of the inferior division, no further endovascular treatment was attempted. The balloon guide was removed. An 8 French Angio-Seal closure device applied successfully at the right groin puncture site. Distal pulses continued to be Dopplerable in both  feet unchanged. An intermediate CT of the brain demonstrated contrast stain +/-petechial hemorrhages in the posterior putamen on the right extending to the caudate head. No mass effect or midline shift was noted. The patient was then transferred to neuro ICU intubated for post revascularization care. IMPRESSION: Endovascular revascularization of occluded right internal carotid artery  supraclinoid segment, the right middle cerebral artery and the right anterior cerebral artery achieving a 2b revascularization. Persistent distal occlusion of the superior division anterior perisylvian branches, with gradual improvement revascularization of the dominant inferior division in the M3 M4 regions. PLAN: Follow-up as per referring neurologist. Electronically Signed   By: Julieanne Cotton M.D.   On: 07/05/2020 14:22   CT HEAD CODE STROKE WO CONTRAST  Result Date: 07/04/2020 CLINICAL DATA:  Left-sided weakness and slurred speech EXAM: CT HEAD WITHOUT CONTRAST CT ANGIOGRAPHY HEAD AND NECK CT PERFUSION BRAIN TECHNIQUE: Multi detector CT imaging of the head was performed using the standard protocol without intravenous contrast. Multidetector CT imaging of the head and neck was performed using the standard protocol during bolus administration of intravenous contrast. Multiplanar CT image reconstructions and MIPs were obtained to evaluate the vascular anatomy. Carotid stenosis measurements (when applicable) are obtained utilizing NASCET criteria, using the distal internal carotid diameter as the denominator. Multiphase CT imaging of the brain was performed following IV bolus contrast injection. Subsequent parametric perfusion maps were calculated using RAPID software. CONTRAST:  OMNIPAQUE IOHEXOL 350 MG/ML SOLN COMPARISON:  None. FINDINGS: CT HEAD FINDINGS Brain: There is no acute intracranial hemorrhage or mass effect. Loss of gray-white differentiation involving a portion of the right lentiform nucleus. Ventricles and sulci are normal in size and configuration. No extra-axial collection. Vascular: No hyperdense vessel. Skull: Unremarkable. Sinuses/Orbits: Lobular maxillary sinus mucosal thickening. Orbits are unremarkable. Other: Minor patchy left mastoid tip opacification. ASPECTS [facility] Stroke Program Early CT Score) - Ganglionic level infarction (caudate, lentiform nuclei, internal capsule,  insula, M1-M3 cortex): 6 - Supraganglionic infarction (M4-M6 cortex): 3 Total score (0-10 with 10 being normal): 9 CTA NECK FINDINGS Aortic arch: Normal caliber aorta.  Great vessel origins are patent. Right carotid system: Common carotid is patent. There is thrombus at the bifurcation extending into the external carotid. Less than 50% stenosis at the ICA origin. The cervical ICA is relatively smaller in caliber. Left carotid system: Patent.  No stenosis. Vertebral arteries: Patent. Right vertebral artery slightly dominant. No stenosis. Skeleton: Unremarkable. Other neck: Thyroid not well evaluated due to streak artifact. Likely reactive cervical lymph nodes. Upper chest: Included lungs are clear. Endotracheal tube is present. Review of the MIP images confirms the above findings CTA HEAD FINDINGS Anterior circulation: Proximal intracranial internal carotid artery is patent. There is loss of enhancement at the level of the supraclinoid portion. Faint enhancement is seen along the right M1 MCA. There is reconstitution of M2 branches. Patent right A1 ACA. Anterior communicating artery is present. Left anterior and middle cerebral arteries are patent. Posterior circulation: Intracranial vertebral arteries, basilar artery, and posterior cerebral arteries are patent. Venous sinuses: As permitted by contrast timing, patent. Review of the MIP images confirms the above findings CT Brain Perfusion Findings: CBF (<30%) Volume: 51mL Perfusion (Tmax>6.0s) volume: Mismatch Volume: 63mL Infarction Location: Right MCA territory IMPRESSION: No acute intracranial hemorrhage. 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 Occlusion of the supraclinoid right ICA probably reflecting embolization from  above thrombus. Reconstitution of right MCA at the bifurcation. Right A1  ACA is patent. Anterior communicating artery is present. No posterior communicating artery is identified. Perfusion imaging demonstrates core infarction of 51 mL. This is greater than anticipated by noncontrast CT. Penumbra is 63 mL and therefore may be greater than calculated. Initial results were called by telephone at the time of interpretation on 07/04/2020 at 1:58 pm to provider Dr. Iver Nestle, who verbally acknowledged these results. Electronically Signed   By: Guadlupe Spanish M.D.   On: 07/04/2020 14:10   VAS [place] LOWER EXTREMITY VENOUS (DVT)  Result Date: 07/05/2020  Lower Venous DVT Study Indications: Embolic stroke.  Comparison Study: No prior Performing Technologist: Marilynne Halsted RDMS, RVT  Examination Guidelines: A complete evaluation includes B-mode imaging, spectral Doppler, color Doppler, and power Doppler as needed of all accessible portions of each vessel. Bilateral testing is considered an integral part of a complete examination. Limited examinations for reoccurring indications may be performed as noted. The reflux portion of the exam is performed with the patient in reverse Trendelenburg.  +---------+---------------+---------+-----------+----------+--------------+ RIGHT    CompressibilityPhasicitySpontaneityPropertiesThrombus Aging +---------+---------------+---------+-----------+----------+--------------+ CFV      Full           Yes      Yes                                 +---------+---------------+---------+-----------+----------+--------------+ SFJ      Full                                                        +---------+---------------+---------+-----------+----------+--------------+ FV Prox  Full                                                        +---------+---------------+---------+-----------+----------+--------------+ FV Mid   Full                                                         +---------+---------------+---------+-----------+----------+--------------+ FV DistalFull                                                        +---------+---------------+---------+-----------+----------+--------------+ PFV      Full                                                        +---------+---------------+---------+-----------+----------+--------------+ POP      Full           Yes      Yes                                 +---------+---------------+---------+-----------+----------+--------------+  PTV      Full                                                        +---------+---------------+---------+-----------+----------+--------------+ PERO     Full                                                        +---------+---------------+---------+-----------+----------+--------------+   +---------+---------------+---------+-----------+----------+--------------+ LEFT     CompressibilityPhasicitySpontaneityPropertiesThrombus Aging +---------+---------------+---------+-----------+----------+--------------+ CFV      Full           Yes      Yes                                 +---------+---------------+---------+-----------+----------+--------------+ SFJ      Full                                                        +---------+---------------+---------+-----------+----------+--------------+ FV Prox  Full                                                        +---------+---------------+---------+-----------+----------+--------------+ FV Mid   Full                                                        +---------+---------------+---------+-----------+----------+--------------+ FV DistalFull                                                        +---------+---------------+---------+-----------+----------+--------------+ PFV      Full                                                         +---------+---------------+---------+-----------+----------+--------------+ POP      Full           Yes      Yes                                 +---------+---------------+---------+-----------+----------+--------------+ PTV      Full                                                        +---------+---------------+---------+-----------+----------+--------------+  PERO     Full                                                        +---------+---------------+---------+-----------+----------+--------------+     Summary: BILATERAL: - No evidence of deep vein thrombosis seen in the lower extremities, bilaterally. -No evidence of popliteal cyst, bilaterally.   *See table(s) above for measurements and observations. Electronically signed by Sherald Hesshristopher Clark MD on 07/05/2020 at 6:21:17 PM.    Final     Review of Systems  Unable to perform ROS: Intubated   Blood pressure (!) 143/80, pulse 94, temperature (!) 100.9 F (38.3 C), temperature source Axillary, resp. rate 18, height 5\' 5"  (1.651 m), weight 72.2 kg, SpO2 100 %.  Physical Exam: The patient is intubated and unable to follow commands.  She does not open her eyes.  Pupils are 3 mm and sluggish.  Bilateral corneal reflexes are impaired.  No blink to threat on the left.  She localizes to painful stimuli on the right.  Minimal withdrawal to noxious stimuli on the left.      Assessment/Plan: 35 year old female with hemorrhagic transformation following tPA and thrombectomy for right CVA.  Neurosurgery was consulted to evaluate patient for potential decompressive craniectomy.  After evaluating the patient, imaging, and discussing with her father, the decision was made to move forward with a stat right decompressive craniectomy.  Council MechanicJoshua L McDaniel, DNP, NP-C 07/06/2020, 11:37 AM

## 2020-07-06 NOTE — Progress Notes (Signed)
I spoke with patient's father, Brandi Myers, and updated him about the surgery and about Brandi Myers's condition.

## 2020-07-06 NOTE — Progress Notes (Signed)
Dr. Wilford Corner notified of patient's sodium of 150. No new orders.

## 2020-07-06 NOTE — Progress Notes (Signed)
NAME:  Brandi Myers, MRN:  [id number], DOB:  [birth year], LOS: 2 ADMISSION DATE:  07/04/2020, CONSULTATION DATE:  07/04/2020 REFERRING MD:  Dr. Corliss Skains, CHIEF COMPLAINT:  Vent management    History of Present Illness:  Brandi Myers is a 35 y.o. with PMH significant for HTN and type 2 diabetes who presented as a code stroke due to left sided weakness and slurred speech.  On arrival patient was also seen altered/combative requiring intubation to obtain CT imaging to rule out stroke.  Head CT demonstrated right ICA occlusion as well as right common carotid bifurcation thrombus resulting in acute stroke.  Patient received TPA in ED at [facility] after which she was transferred to [facility] for further neurological work-up and interventions including thrombectomy.  On arrival to [facility] patient underwent bilateral common carotid arteriograms with revascularization of occluded terminal ICA and right MCA and right ACA.  Post intervention patient will remain intubated and was transferred to neuro ICU. Critical care consulted for ventilator management  Pertinent  Medical History   Uncontrolled diabetes and hypertension  Significant Hospital Events: Including procedures, antibiotic start and stop dates in addition to other pertinent events   .  3/23 > admitted as a code stroke and found to have occluded right ICA resulting in stroke.  Underwent IR revascularization and remained intubated post procedure . 3/24 R brachial clot requiring thrombectomy, hemorrhagic transformation on MRI and worsening midline shift on imaging . 3/25 NSGY consult for decompressive craniectomy  Interim History / Subjective:  As above, given 3% hypertonic.  To go to OR for decompressive craniectomy  Objective   Blood pressure 133/81, pulse 95, temperature (!) 101.7 F (38.7 C), temperature source Oral, resp. rate 18, height 5\' 5"  (1.651 m), weight 72.2 kg, SpO2 100 %.    Vent Mode: PRVC FiO2 (%):  [40 %] 40 % Set  Rate:  [16 bmp] 16 bmp Vt Set:  [450 mL] 450 mL PEEP:  [5 cmH20] 5 cmH20 Plateau Pressure:  [6 cmH20-18 cmH20] 6 cmH20   Intake/Output Summary (Last 24 hours) at 07/06/2020 07/08/2020 Last data filed at 07/06/2020 0700 Gross per 24 hour  Intake 4323.61 ml  Output 1400 ml  Net 2923.61 ml   Filed Weights   07/04/20 1730  Weight: 72.2 kg    Examination: Constitutional: young woman on vent  Eyes: pupils equal, not tracking Ears, nose, mouth, and throat: ETT in place Cardiovascular: RRR, ext warm Respiratory: clear, no wheezing, triggers vent Gastrointestinal: soft, +BS Skin: No rashes, normal turgor Neurologic: moves R to command, has L hemiparesis Psychiatric: ddferred  All cell lines on CBC drifting down  Resolved Hospital Problem list     Assessment & Plan:  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 Acute respiratory insufficiency secondary to above- on vent Uncontrolled type 2 diabetes- at goal here Low TSH, mildly elevated t4- OP repeat labs once drugs out of system 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, hx polysubstance abuse Tobacco abuse Hypophos- repleted  - Continue vent support, VAP prevention bundle - To OR today for decompression, appreciate NSGY help - Goal SBP < 160 - GDMT, hypertonic saline, and stroke workup per neurology - Monitor for s/s of withdrawal - Basal bolus insulin - Start TF post OR  Best practice (evaluated daily)  Diet: TF once back from OR Pain/Anxiety/Delirium protocol (if indicated): PRNs for now VAP protocol (if indicated): Yes DVT prophylaxis: SCD GI prophylaxis: PPI Glucose control:  Basal + SSI Central venous access:  N/A Arterial line:  N/A Foley:  N/A Mobility:  bed rest  PT consulted: Yes Last date of multidisciplinary goals of care discussion Pending  Code Status:  full code Disposition: ICU   Patient critically ill due to respiratory failure Interventions to address this today ventilator weaning Risk of  deterioration without these interventions is high  I personally spent 35 minutes providing critical care not including any separately billable procedures  Myrla Halsted MD [facility]  Prefer epic messenger for cross cover needs If after hours, please call E-link

## 2020-07-06 NOTE — Progress Notes (Signed)
PT Cancellation Note  Patient Details Name: Brandi Myers MRN: [id number] DOB: 1986/04/03   Cancelled Treatment:    Reason Eval/Treat Not Completed: Patient not medically ready;Active bedrest order this morning. Per discussion with RN, awaiting decision on treatment plan for worsening head CT overnight. PT will continue to follow and evaluate when appropriate.   Deland Pretty, DPT   Acute Rehabilitation Department Pager #: [phone]   [name] 07/06/2020, 8:39 AM

## 2020-07-06 NOTE — Plan of Care (Signed)
Pt persistent high grade fever, near 103, UA and CXR unremarkable. Blood culture pending. Concerning for central origin, will start tier two normothermia protocol with arctic sun. Bear-hugger for surface warm.   Marvel Plan, MD PhD Stroke Neurology 07/06/2020 9:26 PM

## 2020-07-06 NOTE — Op Note (Signed)
07/06/2020  1:37 PM  PATIENT:  Brandi Myers  35 y.o. 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   it was therefore elected to take her to surgery for decompressive craniectomy.  Procedure:  Patient was intubated prior to arrival in OR, patient was placed in left semi-lateral position with blanket roll.  Head was placed on donut head holder and right frontal scalp was shaved and prepped and draped in usual sterile fashion.  Area of planned incision was infiltrated with lidocaine. A curvilinear incision was made and carried through temporalis fascia and muscle to expose calvarium.   A large craniotomy flap was elevated. Skull flap was elevated exposing tense dura and swollen brain.  Dura was opened and brain was swollen, but became pulsatile.   Hemostasis was assured.  The dura was placed back over the brain and 3 large sheets of dural matrix graft were placed over the brain. The galea was closed with 2-0 vicryl stitches and the skin was re approximated with staples. Bone flap was then placed in a separate incision in the subcutaneous tissues of the right side of the abdomen and this wound was closed in a similar fashion.  Sterile occlusive dressings were placed.  Patient  was taken back to the Neuro ICU in stable condition having tolerated her surgery well.    PLAN OF CARE: Admit to inpatient   PATIENT DISPOSITION:  PACU - hemodynamically stable.   Delay start of Pharmacological VTE agent (>24hrs) due to surgical blood loss or risk of bleeding: yes

## 2020-07-06 NOTE — Interval H&P Note (Signed)
History and Physical Interval Note:  07/06/2020 11:52 AM  Brandi Myers  has presented today for surgery, with the diagnosis of Cerebral Cytotoxic Edema.  The various methods of treatment have been discussed with the patient and family. After consideration of risks, benefits and other options for treatment, the patient has consented to  Procedure(s): RIGHT CRANIECTOMY WITH PLACEMENT OF BONE FLAP IN ABDOMEN (Right) as a surgical intervention.  The patient's history has been reviewed, patient examined, no change in status, stable for surgery.  I have reviewed the patient's chart and labs.  Questions were answered to the patient's satisfaction.     Dorian Heckle

## 2020-07-06 NOTE — Anesthesia Procedure Notes (Signed)
Arterial Line Insertion Start/End3/25/2022 12:00 PM, 07/06/2020 12:15 PM Performed by: Aundria Rud, CRNA  Patient location: Pre-op. Preanesthetic checklist: patient identified, IV checked, site marked, risks and benefits discussed, surgical consent, monitors and equipment checked, pre-op evaluation, timeout performed and anesthesia consent Lidocaine 1% used for infiltration Left, radial was placed Catheter size: 20 G Hand hygiene performed  and maximum sterile barriers used   Attempts: 3 Procedure performed using ultrasound guided technique. Following insertion, dressing applied and Biopatch. Post procedure assessment: normal and unchanged  Patient tolerated the procedure well with no immediate complications.

## 2020-07-06 NOTE — H&P (View-Only) (Signed)
Reason for Consult: Right cerebral cytotoxic edema with central hemorrhage and midline shift  Referring Physician: Laron Angelini, Daniel, MD   HPI: Brandi Myers is a 35 y.o. female with a PmHx of HTN and DM2 who presented as a code stroke due to left-sided hemiparesis and dysarthria. On arrival to the ED, the patient was also noted to be altered/combative requiring intubation to obtain CT imaging to rule out stroke.  Head CT demonstrated right ICA occlusion as well as right common carotid bifurcation thrombus resulting in acute stroke.  Patient received TPA in ED at [facility] after which she was transferred to [place] for further neurological work-up and interventions including thrombectomy. On arrival to [facility] patient underwent bilateral common carotid arteriograms with revascularization of occluded terminal ICA and right MCA and right ACA. The patient's hospital course was complicated with hemorrhagic transformation and right brachial clot requiring thrombectomy.  Serial CT imaging revealed continued progression of the hemorrhagic transformation with 1 cm midline shift and a significant amount of cerebral cytotoxic edema.  She was started on hypertonic saline.  Neurosurgery was then consulted to evaluate for potential decompressive hemicraniectomy.  Past Medical History:  Diagnosis Date  . Diabetes mellitus without complication (HCC)   . Hypertension     Past Surgical History:  Procedure Laterality Date  . CESAREAN SECTION    . CHOLECYSTECTOMY    . IR PERCUTANEOUS ART THROMBECTOMY/INFUSION INTRACRANIAL INC DIAG ANGIO  07/04/2020  . RADIOLOGY WITH ANESTHESIA N/A 07/04/2020   Procedure: IR WITH ANESTHESIA;  Surgeon: Radiologist, Medication, MD;  Location: [facility] OR;  Service: Radiology;  Laterality: N/A;  . THROMBECTOMY BRACHIAL ARTERY Right 07/05/2020   Procedure: RIGHT UPPER EXTREMITY THROMBECTOMY;  Surgeon: Clark, Christopher J, MD;  Location: [facility] OR;  Service: Vascular;  Laterality: Right;    Family  History  Problem Relation Age of Onset  . Diabetes Maternal Grandmother     Social History:  reports that she has been smoking cigarettes. She has been smoking about 0.10 packs per day. She has never used smokeless tobacco. She reports current alcohol use. She reports that she does not use drugs.  Allergies:  Allergies  Allergen Reactions  . Norco [Hydrocodone-Acetaminophen] Itching, Nausea And Vomiting and Swelling    Medications: I have reviewed the patient's current medications.  Results for orders placed or performed during the hospital encounter of 07/04/20 (from the past 48 hour(s))  CBG monitoring, ED     Status: Abnormal   Collection Time: 07/04/20  3:46 PM  Result Value Ref Range   Glucose-Capillary 316 (H) 70 - 99 mg/dL    Comment: Glucose reference range applies only to samples taken after fasting for at least 8 hours.  Glucose, capillary     Status: Abnormal   Collection Time: 07/04/20  4:44 PM  Result Value Ref Range   Glucose-Capillary 401 (H) 70 - 99 mg/dL    Comment: Glucose reference range applies only to samples taken after fasting for at least 8 hours.  Glucose, capillary     Status: Abnormal   Collection Time: 07/04/20  5:24 PM  Result Value Ref Range   Glucose-Capillary 348 (H) 70 - 99 mg/dL    Comment: Glucose reference range applies only to samples taken after fasting for at least 8 hours.  HIV Antibody (routine testing w rflx)     Status: None   Collection Time: 07/04/20  6:14 PM  Result Value Ref Range   HIV Screen 4th Generation wRfx Non Reactive Non Reactive      Comment: Performed at [facility], [address] [place], [place] [address]  Hemoglobin A1c     Status: Abnormal   Collection Time: 07/04/20  6:14 PM  Result Value Ref Range   Hgb A1c MFr Bld 10.6 (H) 4.8 - 5.6 %    Comment: (NOTE) Pre diabetes:          5.7%-6.4%  Diabetes:              >6.4%  Glycemic control for   <7.0% adults with diabetes    Mean Plasma Glucose 257.52  mg/dL    Comment: Performed at [facility], [address] [place], [place] [address]  I-STAT 7, (LYTES, BLD GAS, ICA, H+H)     Status: Abnormal   Collection Time: 07/04/20  6:44 PM  Result Value Ref Range   pH, Arterial 7.409 7.350 - 7.450   pCO2 arterial 40.1 32.0 - 48.0 mmHg   pO2, Arterial 155 (H) 83.0 - 108.0 mmHg   Bicarbonate 25.3 20.0 - 28.0 mmol/L   TCO2 27 22 - 32 mmol/L   O2 Saturation 99.0 %   Acid-Base Excess 1.0 0.0 - 2.0 mmol/L   Sodium 140 135 - 145 mmol/L   Potassium 3.1 (L) 3.5 - 5.1 mmol/L   Calcium, Ion 1.12 (L) 1.15 - 1.40 mmol/L   HCT 29.0 (L) 36.0 - 46.0 %   Hemoglobin 9.9 (L) 12.0 - 15.0 g/dL   Patient temperature 98.6 F    Collection site Radial    Drawn by HIDE    Sample type ARTERIAL   Glucose, capillary     Status: Abnormal   Collection Time: 07/04/20  7:54 PM  Result Value Ref Range   Glucose-Capillary 158 (H) 70 - 99 mg/dL    Comment: Glucose reference range applies only to samples taken after fasting for at least 8 hours.  Potassium     Status: None   Collection Time: 07/04/20 11:04 PM  Result Value Ref Range   Potassium 3.6 3.5 - 5.1 mmol/L    Comment: Performed at [facility], [address] [place], [place] [address]  Glucose, capillary     Status: Abnormal   Collection Time: 07/04/20 11:32 PM  Result Value Ref Range   Glucose-Capillary 124 (H) 70 - 99 mg/dL    Comment: Glucose reference range applies only to samples taken after fasting for at least 8 hours.  CK     Status: Abnormal   Collection Time: 07/04/20 11:47 PM  Result Value Ref Range   Total CK 612 (H) 38 - 234 U/L    Comment: Performed at [facility], [address] [place], [place] [address]  Rapid urine drug screen (hospital performed)     Status: Abnormal   Collection Time: 07/05/20 12:10 AM  Result Value Ref Range   Opiates NONE DETECTED NONE DETECTED   Cocaine POSITIVE (A) NONE DETECTED   Benzodiazepines NONE DETECTED NONE DETECTED   Amphetamines  NONE DETECTED NONE DETECTED   Tetrahydrocannabinol POSITIVE (A) NONE DETECTED   Barbiturates NONE DETECTED NONE DETECTED    Comment: (NOTE) DRUG SCREEN FOR MEDICAL PURPOSES ONLY.  IF CONFIRMATION IS NEEDED FOR ANY PURPOSE, NOTIFY LAB WITHIN 5 DAYS.  LOWEST DETECTABLE LIMITS FOR URINE DRUG SCREEN Drug Class                     Cutoff (ng/mL) Amphetamine and metabolites    1000 Barbiturate and metabolites      200 Benzodiazepine                 200 Tricyclics and metabolites     300 Opiates and metabolites        300 Cocaine and metabolites        300 THC                            50 Performed at [facility], [address] [place], [place] [address]   Urinalysis, Complete w Microscopic Urine, Unspecified Source     Status: Abnormal   Collection Time: 07/05/20 12:16 AM  Result Value Ref Range   Color, Urine YELLOW YELLOW   APPearance CLEAR CLEAR   Specific Gravity, Urine >1.046 (H) 1.005 - 1.030   pH 5.0 5.0 - 8.0   Glucose, UA >=500 (A) NEGATIVE mg/dL   Hgb urine dipstick MODERATE (A) NEGATIVE   Bilirubin Urine NEGATIVE NEGATIVE   Ketones, ur 5 (A) NEGATIVE mg/dL   Protein, ur NEGATIVE NEGATIVE mg/dL   Nitrite NEGATIVE NEGATIVE   Leukocytes,Ua NEGATIVE NEGATIVE   RBC / HPF 0-5 0 - 5 RBC/hpf   WBC, UA 0-5 0 - 5 WBC/hpf   Bacteria, UA NONE SEEN NONE SEEN   Squamous Epithelial / LPF 0-5 0 - 5    Comment: Performed at [facility], [address] [place], [place] [address]  Glucose, capillary     Status: Abnormal   Collection Time: 07/05/20  4:28 AM  Result Value Ref Range   Glucose-Capillary 139 (H) 70 - 99 mg/dL    Comment: Glucose reference range applies only to samples taken after fasting for at least 8 hours.  Triglycerides     Status: None   Collection Time: 07/05/20  5:41 AM  Result Value Ref Range   Triglycerides 86 <150 mg/dL    Comment: Performed at [facility], [address] [place], [place] [address]  CBC with Differential/Platelet      Status: Abnormal   Collection Time: 07/05/20  5:41 AM  Result Value Ref Range   WBC 14.2 (H) 4.0 - 10.5 K/uL   RBC 4.62 3.87 - 5.11 MIL/uL   Hemoglobin 8.6 (L) 12.0 - 15.0 g/dL    Comment: Reticulocyte Hemoglobin testing may be clinically indicated, consider ordering this additional test LAB10649    HCT 32.0 (L) 36.0 - 46.0 %   MCV 69.3 (L) 80.0 - 100.0 fL   MCH 18.6 (L) 26.0 - 34.0 pg   MCHC 26.9 (L) 30.0 - 36.0 g/dL   RDW 21.8 (H) 11.5 - 15.5 %   Platelets 160 150 - 400 K/uL   nRBC 0.0 0.0 - 0.2 %   Neutrophils Relative % 88 %   Neutro Abs 12.5 (H) 1.7 - 7.7 K/uL   Lymphocytes Relative 10 %   Lymphs Abs 1.4 0.7 - 4.0 K/uL   Monocytes Relative 1 %   Monocytes Absolute 0.1 0.1 - 1.0 K/uL   Eosinophils Relative 1 %   Eosinophils Absolute 0.1 0.0 - 0.5 K/uL   Basophils Relative 0 %   Basophils Absolute 0.0 0.0 - 0.1 K/uL   nRBC 0 0 /100 WBC   Abs Immature Granulocytes 0.00 0.00 - 0.07 K/uL   Polychromasia PRESENT     Comment: Performed at  Hospital Lab, [address] [place],  [address]  Basic metabolic panel     Status: Abnormal   Collection Time:   07/05/20  5:41 AM  Result Value Ref Range   Sodium 141 135 - 145 mmol/L   Potassium 3.1 (L) 3.5 - 5.1 mmol/L   Chloride 107 98 - 111 mmol/L   CO2 25 22 - 32 mmol/L   Glucose, Bld 147 (H) 70 - 99 mg/dL    Comment: Glucose reference range applies only to samples taken after fasting for at least 8 hours.   BUN <5 (L) 6 - 20 mg/dL   Creatinine, Ser 0.61 0.44 - 1.00 mg/dL   Calcium 8.1 (L) 8.9 - 10.3 mg/dL   GFR, Estimated >60 >60 mL/min    Comment: (NOTE) Calculated using the CKD-EPI Creatinine Equation (2021)    Anion gap 9 5 - 15    Comment: Performed at [facility], [address] [place], [place] [address]  Lupus anticoagulant panel     Status: None   Collection Time: 07/05/20  5:41 AM  Result Value Ref Range   PTT Lupus Anticoagulant 29.7 0.0 - 51.9 sec   DRVVT 33.1 0.0 - 47.0 sec   Lupus Anticoag  Interp Comment:     Comment: (NOTE) No lupus anticoagulant was detected. Performed At: [facility] [address] [place], [place] [id number] Nagendra Sanjai MD Ph:[phone]   ANA, IFA (with reflex)     Status: None   Collection Time: 07/05/20  5:41 AM  Result Value Ref Range   ANA Ab, IFA Negative     Comment: (NOTE)                                     Negative   <1:80                                     Borderline  1:80                                     Positive   >1:80 ICAP nomenclature: AC-0 For more information about Hep-2 cell patterns use ANApatterns.org, the official website for the International Consensus on Antinuclear Antibody (ANA) Patterns (ICAP). Performed At: [facility] [address] [place], [place] [id number] Nagendra Sanjai MD Ph:[phone]   C-reactive protein     Status: None   Collection Time: 07/05/20  5:41 AM  Result Value Ref Range   CRP 0.7 <1.0 mg/dL    Comment: Performed at [facility], [address] [place], [place] [address]  Sedimentation rate     Status: None   Collection Time: 07/05/20  5:41 AM  Result Value Ref Range   Sed Rate 6 0 - 22 mm/hr    Comment: Performed at [facility], [address] [place], [place] [address]  Vitamin B12     Status: None   Collection Time: 07/05/20  5:41 AM  Result Value Ref Range   Vitamin B-12 356 180 - 914 pg/mL    Comment: (NOTE) This assay is not validated for testing neonatal or myeloproliferative syndrome specimens for Vitamin B12 levels. Performed at [facility], [address] [place], [place] [address]   TSH     Status: Abnormal   Collection Time: 07/05/20  5:41 AM  Result Value Ref Range   TSH 0.307 (L) 0.350 -   4.500 uIU/mL    Comment: Performed by a 3rd Generation assay with a functional sensitivity of <=0.01 uIU/mL. Performed at [facility], [address] [place], [place] [address]   RPR     Status: None   Collection Time: 07/05/20  5:41  AM  Result Value Ref Range   RPR Ser Ql NON REACTIVE NON REACTIVE    Comment: Performed at [facility], [address] [place], [place] [address]  Lipid panel     Status: Abnormal   Collection Time: 07/05/20  5:41 AM  Result Value Ref Range   Cholesterol 154 0 - 200 mg/dL   Triglycerides 79 <150 mg/dL   HDL 39 (L) >40 mg/dL   Total CHOL/HDL Ratio 3.9 RATIO   VLDL 16 0 - 40 mg/dL   LDL Cholesterol 99 0 - 99 mg/dL    Comment:        Total Cholesterol/HDL:CHD Risk Coronary Heart Disease Risk Table                     Men   Women  1/2 Average Risk   3.4   3.3  Average Risk       5.0   4.4  2 X Average Risk   9.6   7.1  3 X Average Risk  23.4   11.0        Use the calculated Patient Ratio above and the CHD Risk Table to determine the patient's CHD Risk.        ATP III CLASSIFICATION (LDL):  <100     mg/dL   Optimal  100-129  mg/dL   Near or Above                    Optimal  130-159  mg/dL   Borderline  160-189  mg/dL   High  >190     mg/dL   Very High Performed at [facility], [address] [place], [place] [address]   Glucose, capillary     Status: Abnormal   Collection Time: 07/05/20  7:44 AM  Result Value Ref Range   Glucose-Capillary 156 (H) 70 - 99 mg/dL    Comment: Glucose reference range applies only to samples taken after fasting for at least 8 hours.  Glucose, capillary     Status: Abnormal   Collection Time: 07/05/20  7:44 AM  Result Value Ref Range   Glucose-Capillary 156 (H) 70 - 99 mg/dL    Comment: Glucose reference range applies only to samples taken after fasting for at least 8 hours.  T4, free     Status: Abnormal   Collection Time: 07/05/20  8:53 AM  Result Value Ref Range   Free T4 1.27 (H) 0.61 - 1.12 ng/dL    Comment: (NOTE) Biotin ingestion may interfere with free T4 tests. If the results are inconsistent with the TSH level, previous test results, or the clinical presentation, then consider biotin interference. If needed, order  repeat testing after stopping biotin. Performed at [facility], [address] , [place] [address]   Glucose, capillary     Status: Abnormal   Collection Time: 07/05/20 11:17 AM  Result Value Ref Range   Glucose-Capillary 170 (H) 70 - 99 mg/dL    Comment: Glucose reference range applies only to samples taken after fasting for at least 8 hours.  MRSA PCR Screening     Status: None   Collection Time: 07/05/20    2:45 PM   Specimen: Nasal Mucosa; Nasopharyngeal  Result Value Ref Range   MRSA by PCR NEGATIVE NEGATIVE    Comment:        The GeneXpert MRSA Assay (FDA approved for NASAL specimens only), is one component of a comprehensive MRSA colonization surveillance program. It is not intended to diagnose MRSA infection nor to guide or monitor treatment for MRSA infections. Performed at [facility], [address] [place], [place] [address]   Glucose, capillary     Status: Abnormal   Collection Time: 07/05/20  3:33 PM  Result Value Ref Range   Glucose-Capillary 155 (H) 70 - 99 mg/dL    Comment: Glucose reference range applies only to samples taken after fasting for at least 8 hours.  Glucose, capillary     Status: None   Collection Time: 07/05/20  7:18 PM  Result Value Ref Range   Glucose-Capillary 81 70 - 99 mg/dL    Comment: Glucose reference range applies only to samples taken after fasting for at least 8 hours.  Glucose, capillary     Status: Abnormal   Collection Time: 07/05/20 11:20 PM  Result Value Ref Range   Glucose-Capillary 326 (H) 70 - 99 mg/dL    Comment: Glucose reference range applies only to samples taken after fasting for at least 8 hours.  Basic metabolic panel     Status: Abnormal   Collection Time: 07/06/20  2:40 AM  Result Value Ref Range   Sodium 144 135 - 145 mmol/L   Potassium 4.1 3.5 - 5.1 mmol/L    Comment: DELTA CHECK NOTED   Chloride 117 (H) 98 - 111 mmol/L   CO2 19 (L) 22 - 32 mmol/L   Glucose, Bld 125 (H) 70 - 99 mg/dL     Comment: Glucose reference range applies only to samples taken after fasting for at least 8 hours.   BUN 5 (L) 6 - 20 mg/dL   Creatinine, Ser 0.59 0.44 - 1.00 mg/dL   Calcium 8.0 (L) 8.9 - 10.3 mg/dL   GFR, Estimated >60 >60 mL/min    Comment: (NOTE) Calculated using the CKD-EPI Creatinine Equation (2021)    Anion gap 8 5 - 15    Comment: Performed at [facility], [address] [place], [place] [address]  Magnesium     Status: None   Collection Time: 07/06/20  2:40 AM  Result Value Ref Range   Magnesium 2.1 1.7 - 2.4 mg/dL    Comment: Performed at [facility], [address] [place], [place] [address]  CBC     Status: Abnormal   Collection Time: 07/06/20  2:40 AM  Result Value Ref Range   WBC 12.7 (H) 4.0 - 10.5 K/uL   RBC 4.02 3.87 - 5.11 MIL/uL   Hemoglobin 7.4 (L) 12.0 - 15.0 g/dL    Comment: Reticulocyte Hemoglobin testing may be clinically indicated, consider ordering this additional test LAB10649    HCT 28.7 (L) 36.0 - 46.0 %   MCV 71.4 (L) 80.0 - 100.0 fL   MCH 18.4 (L) 26.0 - 34.0 pg   MCHC 25.8 (L) 30.0 - 36.0 g/dL   RDW 21.5 (H) 11.5 - 15.5 %   Platelets 123 (L) 150 - 400 K/uL   nRBC 0.0 0.0 - 0.2 %    Comment: Performed at [facility], [address] [place], [place] [address]  Phosphorus     Status: Abnormal   Collection Time: 07/06/20    2:40 AM  Result Value Ref Range   Phosphorus 2.1 (L) 2.5 - 4.6 mg/dL    Comment: Performed at [facility], [address] [place], [place] [address]  Glucose, capillary     Status: Abnormal   Collection Time: 07/06/20  3:26 AM  Result Value Ref Range   Glucose-Capillary 127 (H) 70 - 99 mg/dL    Comment: Glucose reference range applies only to samples taken after fasting for at least 8 hours.  Glucose, capillary     Status: Abnormal   Collection Time: 07/06/20  7:48 AM  Result Value Ref Range   Glucose-Capillary 120 (H) 70 - 99 mg/dL    Comment: Glucose reference range applies only to samples  taken after fasting for at least 8 hours.    CT Angio Head W/Cm &/Or Wo Cm  Result Date: 07/04/2020 CLINICAL DATA:  Left-sided weakness and slurred speech EXAM: CT HEAD WITHOUT CONTRAST CT ANGIOGRAPHY HEAD AND NECK CT PERFUSION BRAIN TECHNIQUE: Multi detector CT imaging of the head was performed using the standard protocol without intravenous contrast. Multidetector CT imaging of the head and neck was performed using the standard protocol during bolus administration of intravenous contrast. Multiplanar CT image reconstructions and MIPs were obtained to evaluate the vascular anatomy. Carotid stenosis measurements (when applicable) are obtained utilizing NASCET criteria, using the distal internal carotid diameter as the denominator. Multiphase CT imaging of the brain was performed following IV bolus contrast injection. Subsequent parametric perfusion maps were calculated using RAPID software. CONTRAST:  100mL OMNIPAQUE IOHEXOL 350 MG/ML SOLN COMPARISON:  None. FINDINGS: CT HEAD FINDINGS Brain: There is no acute intracranial hemorrhage or mass effect. Loss of gray-white differentiation involving a portion of the right lentiform nucleus. Ventricles and sulci are normal in size and configuration. No extra-axial collection. Vascular: No hyperdense vessel. Skull: Unremarkable. Sinuses/Orbits: Lobular maxillary sinus mucosal thickening. Orbits are unremarkable. Other: Minor patchy left mastoid tip opacification. ASPECTS (Alberta Stroke Program Early CT Score) - Ganglionic level infarction (caudate, lentiform nuclei, internal capsule, insula, M1-M3 cortex): 6 - Supraganglionic infarction (M4-M6 cortex): 3 Total score (0-10 with 10 being normal): 9 CTA NECK FINDINGS Aortic arch: Normal caliber aorta.  Great vessel origins are patent. Right carotid system: Common carotid is patent. There is thrombus at the bifurcation extending into the external carotid. Less than 50% stenosis at the ICA origin. The cervical ICA is  relatively smaller in caliber. Left carotid system: Patent.  No stenosis. Vertebral arteries: Patent. Right vertebral artery slightly dominant. No stenosis. Skeleton: Unremarkable. Other neck: Thyroid not well evaluated due to streak artifact. Likely reactive cervical lymph nodes. Upper chest: Included lungs are clear. Endotracheal tube is present. Review of the MIP images confirms the above findings CTA HEAD FINDINGS Anterior circulation: Proximal intracranial internal carotid artery is patent. There is loss of enhancement at the level of the supraclinoid portion. Faint enhancement is seen along the right M1 MCA. There is reconstitution of M2 branches. Patent right A1 ACA. Anterior communicating artery is present. Left anterior and middle cerebral arteries are patent. Posterior circulation: Intracranial vertebral arteries, basilar artery, and posterior cerebral arteries are patent. Venous sinuses: As permitted by contrast timing, patent. Review of the MIP images confirms the above findings CT Brain Perfusion Findings: CBF (<30%) Volume: 51mL Perfusion (Tmax>6.0s) volume: 114mL Mismatch Volume: 63mL Infarction Location: Right MCA territory IMPRESSION: No acute intracranial hemorrhage. 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 Thrombus at the right common carotid bifurcation extending into the external carotid   with less than 50% narrowing of the ICA origin. 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 Reconstitution of right MCA at the bifurcation. Right A1 ACA is patent. Anterior communicating artery is present. No posterior communicating artery is identified. Perfusion imaging demonstrates core infarction of 51 mL. This is greater than anticipated by noncontrast CT. Penumbra is 63 mL and therefore may be greater than calculated. Initial results were called by telephone at the time of interpretation on 07/04/2020 at 1:58 pm to provider  Dr. Bhagat, who verbally acknowledged these results. Electronically Signed   By: Praneil  Patel M.D.   On: 07/04/2020 14:10   DG Chest 1 View  Result Date: 07/04/2020 CLINICAL DATA:  Intubation EXAM: CHEST  1 VIEW COMPARISON:  01/12/2016 FINDINGS: Endotracheal tube is 3.6 cm above the carina. NG tube is in the stomach. No confluent opacities or effusions. Heart is normal size. No acute bony abnormality. IMPRESSION: Support devices in expected position. No acute cardiopulmonary disease. Electronically Signed   By: Kevin  Dover M.D.   On: 07/04/2020 15:09   CT HEAD WO CONTRAST  Result Date: 07/06/2020 CLINICAL DATA:  Follow-up intracranial hemorrhage EXAM: CT HEAD WITHOUT CONTRAST TECHNIQUE: Contiguous axial images were obtained from the base of the skull through the vertex without intravenous contrast. COMPARISON:  Yesterday FINDINGS: Brain: Cytotoxic edema involving cortex and basal ganglia in the right MCA territory with hazy central hemorrhage that is non progressed. Midline shift measures up to 1 cm. Unchanged dilatation of the left lateral ventricle attributed to the degree of midline shift and mass effect, with transependymal flow. Vascular: Stable Skull: Stable Sinuses/Orbits: Nasal septal perforation and patchy sinus opacification. IMPRESSION: 1. Unchanged right cerebral cytotoxic edema with central hemorrhage. Midline shift is similar at 1 cm. 2. Unchanged left lateral ventricle dilatation with evidence of transependymal flow. Electronically Signed   By: Jonathon  Watts M.D.   On: 07/06/2020 08:19   CT HEAD WO CONTRAST  Result Date: 07/05/2020 CLINICAL DATA:  Follow-up examination for acute stroke. EXAM: CT HEAD WITHOUT CONTRAST TECHNIQUE: Contiguous axial images were obtained from the base of the skull through the vertex without intravenous contrast. COMPARISON:  Comparison made with prior MRI from earlier the same day. FINDINGS: Brain: Continued interval evolution of right MCA distribution  infarct, stable from prior MRI. Associated hemorrhagic transformation with hyperdense blood products throughout the area of infarction. Worsened regional mass effect and edema, with progressive effacement of the right lateral ventricle. Associated right-to-left midline shift has increased now measuring up to 9 mm. Increased dilatation of the right lateral ventricle concerning for developing ventricular trapping. Basilar cisterns remain patent. No transtentorial herniation. No other acute large vessel territory infarct or hemorrhage. No extra-axial collection. Vascular: No definite hyperdense vessel. Skull: Scalp soft tissues and calvarium within normal limits. Sinuses/Orbits: Visualized globes and orbital soft tissues within normal limits. Scattered mucosal thickening throughout the ethmoidal air cells, maxillary sinuses, and sphenoid sinuses. Small bilateral mastoid effusions noted. Other: None. IMPRESSION: 1. Continued interval evolution of right MCA distribution infarct with associated hemorrhagic transformation. Worsened regional mass effect and edema with increased right-to-left midline shift now measuring up to 9 mm. Increased dilatation of the right lateral ventricle concerning for developing ventricular trapping. 2. No other new acute intracranial abnormality. Critical Value/emergent results were called by telephone at the time of interpretation on 07/05/2020 at 11:05 pm to provider Dr. Arora, who verbally acknowledged these results. Electronically Signed   By: Benjamin  McClintock M.D.   On: 07/05/2020   23:06   CT Angio Neck W and/or Wo Contrast  Result Date: 07/04/2020 CLINICAL DATA:  Left-sided weakness and slurred speech EXAM: CT HEAD WITHOUT CONTRAST CT ANGIOGRAPHY HEAD AND NECK CT PERFUSION BRAIN TECHNIQUE: Multi detector CT imaging of the head was performed using the standard protocol without intravenous contrast. Multidetector CT imaging of the head and neck was performed using the standard  protocol during bolus administration of intravenous contrast. Multiplanar CT image reconstructions and MIPs were obtained to evaluate the vascular anatomy. Carotid stenosis measurements (when applicable) are obtained utilizing NASCET criteria, using the distal internal carotid diameter as the denominator. Multiphase CT imaging of the brain was performed following IV bolus contrast injection. Subsequent parametric perfusion maps were calculated using RAPID software. CONTRAST:  100mL OMNIPAQUE IOHEXOL 350 MG/ML SOLN COMPARISON:  None. FINDINGS: CT HEAD FINDINGS Brain: There is no acute intracranial hemorrhage or mass effect. Loss of gray-white differentiation involving a portion of the right lentiform nucleus. Ventricles and sulci are normal in size and configuration. No extra-axial collection. Vascular: No hyperdense vessel. Skull: Unremarkable. Sinuses/Orbits: Lobular maxillary sinus mucosal thickening. Orbits are unremarkable. Other: Minor patchy left mastoid tip opacification. ASPECTS (Alberta Stroke Program Early CT Score) - Ganglionic level infarction (caudate, lentiform nuclei, internal capsule, insula, M1-M3 cortex): 6 - Supraganglionic infarction (M4-M6 cortex): 3 Total score (0-10 with 10 being normal): 9 CTA NECK FINDINGS Aortic arch: Normal caliber aorta.  Great vessel origins are patent. Right carotid system: Common carotid is patent. There is thrombus at the bifurcation extending into the external carotid. Less than 50% stenosis at the ICA origin. The cervical ICA is relatively smaller in caliber. Left carotid system: Patent.  No stenosis. Vertebral arteries: Patent. Right vertebral artery slightly dominant. No stenosis. Skeleton: Unremarkable. Other neck: Thyroid not well evaluated due to streak artifact. Likely reactive cervical lymph nodes. Upper chest: Included lungs are clear. Endotracheal tube is present. Review of the MIP images confirms the above findings CTA HEAD FINDINGS Anterior circulation:  Proximal intracranial internal carotid artery is patent. There is loss of enhancement at the level of the supraclinoid portion. Faint enhancement is seen along the right M1 MCA. There is reconstitution of M2 branches. Patent right A1 ACA. Anterior communicating artery is present. Left anterior and middle cerebral arteries are patent. Posterior circulation: Intracranial vertebral arteries, basilar artery, and posterior cerebral arteries are patent. Venous sinuses: As permitted by contrast timing, patent. Review of the MIP images confirms the above findings CT Brain Perfusion Findings: CBF (<30%) Volume: 51mL Perfusion (Tmax>6.0s) volume: 114mL Mismatch Volume: 63mL Infarction Location: Right MCA territory IMPRESSION: No acute intracranial hemorrhage. 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 Reconstitution of right MCA at the bifurcation. Right A1 ACA is patent. Anterior communicating artery is present. No posterior communicating artery is identified. Perfusion imaging demonstrates core infarction of 51 mL. This is greater than anticipated by noncontrast CT. Penumbra is 63 mL and therefore may be greater than calculated. Initial results were called by telephone at the time of interpretation on 07/04/2020 at 1:58 pm to provider Dr. Bhagat, who verbally acknowledged these results. Electronically Signed   By: Praneil  Patel M.D.   On: 07/04/2020 14:10   MR ANGIO HEAD WO CONTRAST  Result Date: 07/05/2020 CLINICAL DATA:  Stroke.  Post thrombectomy and tPA EXAM: MRI HEAD   WITHOUT CONTRAST MRA HEAD WITHOUT CONTRAST TECHNIQUE: Multiplanar, multiecho pulse sequences of the brain and surrounding structures were obtained without intravenous contrast. Angiographic images of  the head were obtained using MRA technique without contrast. COMPARISON:  CT angio head and neck 07/04/2020 FINDINGS: MRI HEAD FINDINGS Brain: Acute infarct right MCA territory. This involves much of the right basal ganglia as well as the right frontal and temporal lobe. There is hemorrhagic transformation with mild-to-moderate blood in the infarct. There is local mass-effect and midline shift of 6 mm. No hydrocephalus. No evidence of chronic ischemia. No mass lesion. Vascular: Normal arterial flow voids at the base of brain. Skull and upper cervical spine: No focal skeletal abnormality. Sinuses/Orbits: Moderate mucosal edema paranasal sinuses. Bilateral mastoid effusion normal orbit Other: None MRA HEAD FINDINGS Both vertebral arteries are normal. Left PICA patent. Large right AICA patent. Basilar patent. Superior cerebellar and posterior cerebral arteries normal bilaterally. Internal carotid artery is widely patent bilaterally without stenosis or filling defect. Anterior and middle cerebral arteries widely patent bilaterally without stenosis or occlusion. No aneurysm. 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 Hemorrhagic transformation with mild to moderate hemorrhage in the infarct. There is mass-effect and 6 mm midline shift to left Negative MRA head Electronically Signed   By: Charles  Clark M.D.   On: 07/05/2020 10:01   MR BRAIN WO CONTRAST  Result Date: 07/05/2020 CLINICAL DATA:  Stroke.  Post thrombectomy and tPA EXAM: MRI HEAD WITHOUT CONTRAST MRA HEAD WITHOUT CONTRAST TECHNIQUE: Multiplanar, multiecho pulse sequences of the brain and surrounding structures were obtained without intravenous contrast. Angiographic images of the head were obtained using MRA technique without contrast. COMPARISON:  CT angio head and neck 07/04/2020 FINDINGS: MRI HEAD FINDINGS Brain: Acute infarct right MCA territory. This involves much of the right basal ganglia as  well as the right frontal and temporal lobe. There is hemorrhagic transformation with mild-to-moderate blood in the infarct. There is local mass-effect and midline shift of 6 mm. No hydrocephalus. No evidence of chronic ischemia. No mass lesion. Vascular: Normal arterial flow voids at the base of brain. Skull and upper cervical spine: No focal skeletal abnormality. Sinuses/Orbits: Moderate mucosal edema paranasal sinuses. Bilateral mastoid effusion normal orbit Other: None MRA HEAD FINDINGS Both vertebral arteries are normal. Left PICA patent. Large right AICA patent. Basilar patent. Superior cerebellar and posterior cerebral arteries normal bilaterally. Internal carotid artery is widely patent bilaterally without stenosis or filling defect. Anterior and middle cerebral arteries widely patent bilaterally without stenosis or occlusion. No aneurysm. 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 Hemorrhagic transformation with mild to moderate hemorrhage in the infarct. There is mass-effect and 6 mm midline shift to left Negative MRA head Electronically Signed   By: Charles  Clark M.D.   On: 07/05/2020 10:01   CT CEREBRAL PERFUSION W CONTRAST  Result Date: 07/04/2020 CLINICAL DATA:  Left-sided weakness and slurred speech EXAM: CT HEAD WITHOUT CONTRAST CT ANGIOGRAPHY HEAD AND NECK CT PERFUSION BRAIN TECHNIQUE: Multi detector CT imaging of the head was performed using the standard protocol without intravenous contrast. Multidetector CT imaging of the head and neck was performed using the standard protocol during bolus administration of intravenous contrast. Multiplanar CT image reconstructions and MIPs were obtained to evaluate the vascular anatomy. Carotid stenosis measurements (when applicable) are obtained utilizing NASCET criteria, using the distal internal carotid diameter as the denominator. Multiphase CT imaging of the brain was performed following IV bolus    contrast injection. Subsequent parametric perfusion maps were calculated using RAPID software. CONTRAST:  100mL OMNIPAQUE IOHEXOL 350 MG/ML SOLN COMPARISON:  None. FINDINGS: CT HEAD FINDINGS Brain: There is no acute intracranial hemorrhage or mass effect. Loss of gray-white differentiation involving a portion of the right lentiform nucleus. Ventricles and sulci are normal in size and configuration. No extra-axial collection. Vascular: No hyperdense vessel. Skull: Unremarkable. Sinuses/Orbits: Lobular maxillary sinus mucosal thickening. Orbits are unremarkable. Other: Minor patchy left mastoid tip opacification. ASPECTS (Alberta Stroke Program Early CT Score) - Ganglionic level infarction (caudate, lentiform nuclei, internal capsule, insula, M1-M3 cortex): 6 - Supraganglionic infarction (M4-M6 cortex): 3 Total score (0-10 with 10 being normal): 9 CTA NECK FINDINGS Aortic arch: Normal caliber aorta.  Great vessel origins are patent. Right carotid system: Common carotid is patent. There is thrombus at the bifurcation extending into the external carotid. Less than 50% stenosis at the ICA origin. The cervical ICA is relatively smaller in caliber. Left carotid system: Patent.  No stenosis. Vertebral arteries: Patent. Right vertebral artery slightly dominant. No stenosis. Skeleton: Unremarkable. Other neck: Thyroid not well evaluated due to streak artifact. Likely reactive cervical lymph nodes. Upper chest: Included lungs are clear. Endotracheal tube is present. Review of the MIP images confirms the above findings CTA HEAD FINDINGS Anterior circulation: Proximal intracranial internal carotid artery is patent. There is loss of enhancement at the level of the supraclinoid portion. Faint enhancement is seen along the right M1 MCA. There is reconstitution of M2 branches. Patent right A1 ACA. Anterior communicating artery is present. Left anterior and middle cerebral arteries are patent. Posterior circulation: Intracranial  vertebral arteries, basilar artery, and posterior cerebral arteries are patent. Venous sinuses: As permitted by contrast timing, patent. Review of the MIP images confirms the above findings CT Brain Perfusion Findings: CBF (<30%) Volume: 51mL Perfusion (Tmax>6.0s) volume: 114mL Mismatch Volume: 63mL Infarction Location: Right MCA territory IMPRESSION: No acute intracranial hemorrhage. 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 Reconstitution of right MCA at the bifurcation. Right A1 ACA is patent. Anterior communicating artery is present. No posterior communicating artery is identified. Perfusion imaging demonstrates core infarction of 51 mL. This is greater than anticipated by noncontrast CT. Penumbra is 63 mL and therefore may be greater than calculated. Initial results were called by telephone at the time of interpretation on 07/04/2020 at 1:58 pm to provider Dr. Bhagat, who verbally acknowledged these results. Electronically Signed   By: Praneil  Patel M.D.   On: 07/04/2020 14:10   DG Abd Portable 1 View  Result Date: 07/04/2020 CLINICAL DATA:  OG tube placement. EXAM: PORTABLE ABDOMEN - 1 VIEW COMPARISON:  No prior. FINDINGS: OG tube noted with tip over the stomach. No gastric or bowel distention noted. Contrast noted both kidneys. Degenerative change lumbar spine. Surgical clips right upper quadrant. IMPRESSION: OG tube noted with tip over the stomach. No gastric or bowel distention noted. Electronically Signed   By: Thomas  Register   On: 07/04/2020 15:13   VAS US UPPER EXTREMITY ARTERIAL DUPLEX  Result Date: 07/06/2020 UPPER EXTREMITY DUPLEX STUDY Indications: Patient complains of RUE - cool to touch.  Risk Factors: Prior CVA. Performing  Technologist: Jody Hill  Examination Guidelines: A complete evaluation includes B-mode imaging, spectral Doppler, color Doppler, and power Doppler as needed of all accessible   portions of each vessel. Bilateral testing is considered an integral part of a complete examination. Limited examinations for reoccurring indications may be performed as noted.  Right Doppler Findings: +---------------+----------+----------+--------+--------+ Site           PSV (cm/s)Waveform  StenosisComments +---------------+----------+----------+--------+--------+ Subclavian Prox81        triphasic                  +---------------+----------+----------+--------+--------+ Subclavian Dist          absent                     +---------------+----------+----------+--------+--------+ Axillary       63        triphasic                  +---------------+----------+----------+--------+--------+ Brachial Prox  51        biphasic                   +---------------+----------+----------+--------+--------+ Brachial Mid   33        biphasic                   +---------------+----------+----------+--------+--------+ Brachial Dist  14        monophasicoccluded         +---------------+----------+----------+--------+--------+ Radial Prox    0         absent    occluded         +---------------+----------+----------+--------+--------+ Radial Mid     0         absent    occluded         +---------------+----------+----------+--------+--------+ Radial Dist    0         absent    occluded         +---------------+----------+----------+--------+--------+ Ulnar Prox     0         absent    occluded         +---------------+----------+----------+--------+--------+ Ulnar Mid      0         absent                     +---------------+----------+----------+--------+--------+ Ulnar Dist     0         absent                     +---------------+----------+----------+--------+--------+  Incidental finding: RUE cephalic SVT.   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 Vascular consult recommended.    Preliminary    ECHOCARDIOGRAM COMPLETE  Result Date: 07/05/2020    ECHOCARDIOGRAM REPORT   Patient Name:   Brandi Myers Date of Exam: 07/05/2020 Medical Rec #:  [id number]       Height:       65.0 in Accession #:    [id number]      Weight:       159.2 lb Date of Birth:  01/09/1986      BSA:          1.795 m Patient Age:    34 years        BP:           130/91 mmHg Patient Gender: F               HR:             115 bpm. Exam Location:  Inpatient Procedure: 2D Echo, Cardiac Doppler and Color Doppler Indications:    Stroke 434.91  History:        Patient has no prior history of Echocardiogram examinations.                 Risk Factors:Hypertension and Diabetes.  Sonographer:    Tiffany Dance Referring Phys: [id number] ERIC LINDZEN IMPRESSIONS  1. Left ventricular ejection fraction, by estimation, is 60 to 65%. The left ventricle has normal function. The left ventricle has no regional wall motion abnormalities. Left ventricular diastolic parameters are consistent with Grade I diastolic dysfunction (impaired relaxation).  2. Right ventricular systolic function is normal. The right ventricular size is normal.  3. The mitral valve is normal in structure. Trivial mitral valve regurgitation. No evidence of mitral stenosis.  4. The aortic valve is normal in structure. Aortic valve regurgitation is not visualized. No aortic stenosis is present.  5. The inferior vena cava is normal in size with greater than 50% respiratory variability, suggesting right atrial pressure of 3 mmHg. Conclusion(s)/Recommendation(s): No intracardiac source of embolism detected on this transthoracic study. A transesophageal echocardiogram is recommended to exclude cardiac source of embolism if clinically indicated. FINDINGS   Left Ventricle: Left ventricular ejection fraction, by estimation, is 60 to 65%. The left ventricle has normal function. The left ventricle has no regional wall motion abnormalities. The left ventricular internal cavity size was normal in size. There is  no left ventricular hypertrophy. Left ventricular diastolic parameters are consistent with Grade I diastolic dysfunction (impaired relaxation). Normal left ventricular filling pressure. Right Ventricle: The right ventricular size is normal. No increase in right ventricular wall thickness. Right ventricular systolic function is normal. Left Atrium: Left atrial size was normal in size. Right Atrium: Right atrial size was normal in size. Pericardium: There is no evidence of pericardial effusion. Mitral Valve: The mitral valve is normal in structure. Trivial mitral valve regurgitation. No evidence of mitral valve stenosis. Tricuspid Valve: The tricuspid valve is normal in structure. Tricuspid valve regurgitation is trivial. No evidence of tricuspid stenosis. Aortic Valve: The aortic valve is normal in structure. Aortic valve regurgitation is not visualized. No aortic stenosis is present. Pulmonic Valve: The pulmonic valve was normal in structure. Pulmonic valve regurgitation is not visualized. No evidence of pulmonic stenosis. Aorta: The aortic root is normal in size and structure. Venous: The inferior vena cava is normal in size with greater than 50% respiratory variability, suggesting right atrial pressure of 3 mmHg. IAS/Shunts: No atrial level shunt detected by color flow Doppler.  LEFT VENTRICLE PLAX 2D LVIDd:         4.20 cm  Diastology LVIDs:         3.20 cm  LV e' medial:    10.30 cm/s LV PW:         0.90 cm  LV E/e' medial:  8.5 LV IVS:        0.80 cm  LV e' lateral:   15.00 cm/s LVOT diam:     2.10 cm  LV E/e' lateral: 5.9 LV SV:         55 LV SV Index:   31 LVOT Area:     3.46 cm  RIGHT VENTRICLE             IVC RV Basal diam:  2.40 cm     IVC diam: 1.40 cm  RV S prime:     13.40 cm/s TAPSE (M-mode): 2.2 cm LEFT   ATRIUM             Index       RIGHT ATRIUM          Index LA diam:        2.70 cm 1.50 cm/m  RA Area:     8.48 cm LA Vol (A2C):   27.5 ml 15.32 ml/m RA Volume:   17.50 ml 9.75 ml/m LA Vol (A4C):   18.7 ml 10.42 ml/m LA Biplane Vol: 23.2 ml 12.92 ml/m  AORTIC VALVE LVOT Vmax:   99.20 cm/s LVOT Vmean:  66.100 cm/s LVOT VTI:    0.160 m  AORTA Ao Root diam: 3.20 cm Ao Asc diam:  2.60 cm MITRAL VALVE MV Area (PHT): 2.97 cm    SHUNTS MV Decel Time: 256 msec    Systemic VTI:  0.16 m MV E velocity: 88.05 cm/s  Systemic Diam: 2.10 cm Katarina Nelson MD Electronically signed by Katarina Nelson MD Signature Date/Time: 07/05/2020/12:43:53 PM    Final    IR PERCUTANEOUS ART THROMBECTOMY/INFUSION INTRACRANIAL INC DIAG ANGIO  Result Date: 07/06/2020 INDICATION: New onset of left-sided weakness, right gaze deviation and slurred speech. 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 EXAM: 1. EMERGENT LARGE VESSEL OCCLUSION THROMBOLYSIS (anterior CIRCULATION) COMPARISON:  CT angiogram of the head and neck of July 04, 2020. MEDICATIONS: Ancef 2 g IV antibiotic was administered within 1 hour of the procedure. ANESTHESIA/SEDATION: General anesthesia CONTRAST:  Isovue 300 105 mL. FLUOROSCOPY TIME:  Fluoroscopy Time: 24 minutes 0 seconds (1208 mGy). COMPLICATIONS: None immediate. TECHNIQUE: Two physician emergency consent was obtained in view of the non availability of patient's family or next of kin physically or via phone. The patient was then put under general anesthesia by the Department of Anesthesiology at [facility]. The right groin groin was prepped and draped in the usual sterile fashion. Thereafter using modified Seldinger technique, transfemoral access into the right common femoral artery was obtained without difficulty. Over a 0.035  inch guidewire an 8 French 25 cm Pinnacle sheath was inserted. Through this, and also over a 0.035 inch guidewire a 5 French JB 1 catheter was advanced to the aortic arch region and selectively positioned in the left common carotid artery, the right vertebral artery and the right common carotid artery. FINDINGS: The left common carotid arteriogram demonstrates the left external carotid artery and its major branches to be widely patent. The left internal carotid artery at the bulb to the cranial skull base is widely patent. The petrous, the cavernous and the supraclinoid segments are widely patent. There is an approximately 3 mm outpouching in the left posterior communicating artery region which may represent an infundibulum versus an aneurysm. The left middle cerebral artery and the left anterior cerebral artery opacify into the capillary and venous phases. Prompt cross-filling via the anterior communicating artery of the right anterior cerebral A2 segment and distally, and the right anterior cerebral A1 segment and the distal A2 region is noted. The right common carotid arteriogram demonstrates the right external carotid artery to be occluded. There is a meniscal filling defect right at the origin of the right external carotid artery. Minimal extrusion was seen into the right internal carotid artery at the bulb. More distally the right internal carotid artery is seen to opacify to the cranial skull base. Patency is seen of the petrous and the caval and proximal cavernous   segments. 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 The right vertebral artery origin is widely patent. The vessel is seen to opacify to the cranial skull base. More distally patency is seen of the right vertebrobasilar junction and the right posterior-inferior cerebellar artery. The basilar artery, the posterior cerebral arteries, the superior cerebellar arteries and the anterior-inferior  cerebellar arteries demonstrate wide patency into the capillary and venous phases. 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 PROCEDURE: The diagnostic JB 1 catheter in right common carotid artery was then exchanged over a 0.035 inch 300 cm Rosen exchange guidewire for an 087 balloon guide catheter which was positioned in the mid cervical right ICA. The guidewire was removed. Good aspiration obtained from the hub of the balloon guide catheter. A control arteriogram performed through this demonstrated no evidence of spasms, dissections or of intraluminal filling defects. Over a 0.014 inch standard Synchro micro guidewire with a J configuration the combination of an 071 136 cm Zoom aspiration catheter inside of which was an 021 162 cm Trevo ProVue microcatheter was advanced to the level of the ipsilateral ophthalmic artery. Thereafter, the micro guidewire was gently manipulated without difficulty through the occluded right internal carotid artery supraclinoid segment into the right middle cerebral artery and into the M2 M3 segment of the inferior division of the right middle cerebral artery. This was followed by advancement of the microcatheter. The guidewire was removed. Good aspiration obtained from the hub of the microcatheter. A gentle control arteriogram performed through this demonstrated antegrade flow into the distal M3 M4 segment of the inferior division of the right middle cerebral artery. This was then connected to continuous heparinized saline infusion. A 5 mm x 37 mm Embotrap retrieval device was then advanced to the distal end of the microcatheter and deployed in the usual manner. The Zoom aspiration catheter was advanced such that it was in the distal occluded right middle cerebral artery. With constant aspiration being applied at the hub of the Zoom aspiration catheter with a Penumbra aspiration device, and with a  20 mL syringe at the hub of the balloon guide, and proximal flow arrest over 2 minutes, the combination of the retrieval device, microcatheter, and the Zoom aspiration catheter were retrieved and removed. Following reversal of flow arrest, a control arteriogram performed through the balloon guide in the right internal carotid artery demonstrated complete revascularization of the occluded right internal carotid artery supraclinoid segment, the right middle cerebral artery superior and inferior anterior temporal branches. However, noted were occlusions of the M3 region of the superior division in its two perisylvian branches, and also of the inferior division in the distal M3 segment. Spasm in the right internal carotid artery was treated with 2 aliquots of 25 mcg of nitroglycerin with relief of the vasospasm. Control arteriogram performed through the balloon guide in the right internal carotid artery demonstrated continued patency of the superior and the inferior divisions with no change in the occlusion of the 2 anterior perisylvian branches of the superior division, and also of the inferior division of the M3 region which over a period of time demonstrated improved revascularization into the M4 regions. A TICI 2b revascularization was achieved. Given the gradual revascularization of the inferior division, no further endovascular treatment was attempted. The balloon guide was removed. An 8 French Angio-Seal closure device applied successfully at the right groin puncture site. Distal pulses continued to be Dopplerable in both   feet unchanged. An intermediate CT of the brain demonstrated contrast stain +/-petechial hemorrhages in the posterior putamen on the right extending to the caudate head. No mass effect or midline shift was noted. The patient was then transferred to neuro ICU intubated for post revascularization care. IMPRESSION: Endovascular revascularization of occluded right internal carotid artery  supraclinoid segment, the right middle cerebral artery and the right anterior cerebral artery achieving a 2b revascularization. Persistent distal occlusion of the superior division anterior perisylvian branches, with gradual improvement revascularization of the dominant inferior division in the M3 M4 regions. PLAN: Follow-up as per referring neurologist. Electronically Signed   By: Sanjeev  Deveshwar M.D.   On: 07/05/2020 14:22   CT HEAD CODE STROKE WO CONTRAST  Result Date: 07/04/2020 CLINICAL DATA:  Left-sided weakness and slurred speech EXAM: CT HEAD WITHOUT CONTRAST CT ANGIOGRAPHY HEAD AND NECK CT PERFUSION BRAIN TECHNIQUE: Multi detector CT imaging of the head was performed using the standard protocol without intravenous contrast. Multidetector CT imaging of the head and neck was performed using the standard protocol during bolus administration of intravenous contrast. Multiplanar CT image reconstructions and MIPs were obtained to evaluate the vascular anatomy. Carotid stenosis measurements (when applicable) are obtained utilizing NASCET criteria, using the distal internal carotid diameter as the denominator. Multiphase CT imaging of the brain was performed following IV bolus contrast injection. Subsequent parametric perfusion maps were calculated using RAPID software. CONTRAST:  100mL OMNIPAQUE IOHEXOL 350 MG/ML SOLN COMPARISON:  None. FINDINGS: CT HEAD FINDINGS Brain: There is no acute intracranial hemorrhage or mass effect. Loss of gray-white differentiation involving a portion of the right lentiform nucleus. Ventricles and sulci are normal in size and configuration. No extra-axial collection. Vascular: No hyperdense vessel. Skull: Unremarkable. Sinuses/Orbits: Lobular maxillary sinus mucosal thickening. Orbits are unremarkable. Other: Minor patchy left mastoid tip opacification. ASPECTS ([place]berta Stroke Program Early CT Score) - Ganglionic level infarction (caudate, lentiform nuclei, internal capsule,  insula, M1-M3 cortex): 6 - Supraganglionic infarction (M4-M6 cortex): 3 Total score (0-10 with 10 being normal): 9 CTA NECK FINDINGS Aortic arch: Normal caliber aorta.  Great vessel origins are patent. Right carotid system: Common carotid is patent. There is thrombus at the bifurcation extending into the external carotid. Less than 50% stenosis at the ICA origin. The cervical ICA is relatively smaller in caliber. Left carotid system: Patent.  No stenosis. Vertebral arteries: Patent. Right vertebral artery slightly dominant. No stenosis. Skeleton: Unremarkable. Other neck: Thyroid not well evaluated due to streak artifact. Likely reactive cervical lymph nodes. Upper chest: Included lungs are clear. Endotracheal tube is present. Review of the MIP images confirms the above findings CTA HEAD FINDINGS Anterior circulation: Proximal intracranial internal carotid artery is patent. There is loss of enhancement at the level of the supraclinoid portion. Faint enhancement is seen along the right M1 MCA. There is reconstitution of M2 branches. Patent right A1 ACA. Anterior communicating artery is present. Left anterior and middle cerebral arteries are patent. Posterior circulation: Intracranial vertebral arteries, basilar artery, and posterior cerebral arteries are patent. Venous sinuses: As permitted by contrast timing, patent. Review of the MIP images confirms the above findings CT Brain Perfusion Findings: CBF (<30%) Volume: 51mL Perfusion (Tmax>6.0s) volume: 114mL Mismatch Volume: 63mL Infarction Location: Right MCA territory IMPRESSION: No acute intracranial hemorrhage. 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 Occlusion of the supraclinoid right ICA probably reflecting embolization from   above thrombus. Reconstitution of right MCA at the bifurcation. Right A1  ACA is patent. Anterior communicating artery is present. No posterior communicating artery is identified. Perfusion imaging demonstrates core infarction of 51 mL. This is greater than anticipated by noncontrast CT. Penumbra is 63 mL and therefore may be greater than calculated. Initial results were called by telephone at the time of interpretation on 07/04/2020 at 1:58 pm to provider Dr. Bhagat, who verbally acknowledged these results. Electronically Signed   By: Praneil  Patel M.D.   On: 07/04/2020 14:10   VAS US LOWER EXTREMITY VENOUS (DVT)  Result Date: 07/05/2020  Lower Venous DVT Study Indications: Embolic stroke.  Comparison Study: No prior Performing Technologist: Rita Sturdivant RDMS, RVT  Examination Guidelines: A complete evaluation includes B-mode imaging, spectral Doppler, color Doppler, and power Doppler as needed of all accessible portions of each vessel. Bilateral testing is considered an integral part of a complete examination. Limited examinations for reoccurring indications may be performed as noted. The reflux portion of the exam is performed with the patient in reverse Trendelenburg.  +---------+---------------+---------+-----------+----------+--------------+ RIGHT    CompressibilityPhasicitySpontaneityPropertiesThrombus Aging +---------+---------------+---------+-----------+----------+--------------+ CFV      Full           Yes      Yes                                 +---------+---------------+---------+-----------+----------+--------------+ SFJ      Full                                                        +---------+---------------+---------+-----------+----------+--------------+ FV Prox  Full                                                        +---------+---------------+---------+-----------+----------+--------------+ FV Mid   Full                                                         +---------+---------------+---------+-----------+----------+--------------+ FV DistalFull                                                        +---------+---------------+---------+-----------+----------+--------------+ PFV      Full                                                        +---------+---------------+---------+-----------+----------+--------------+ POP      Full           Yes      Yes                                 +---------+---------------+---------+-----------+----------+--------------+   PTV      Full                                                        +---------+---------------+---------+-----------+----------+--------------+ PERO     Full                                                        +---------+---------------+---------+-----------+----------+--------------+   +---------+---------------+---------+-----------+----------+--------------+ LEFT     CompressibilityPhasicitySpontaneityPropertiesThrombus Aging +---------+---------------+---------+-----------+----------+--------------+ CFV      Full           Yes      Yes                                 +---------+---------------+---------+-----------+----------+--------------+ SFJ      Full                                                        +---------+---------------+---------+-----------+----------+--------------+ FV Prox  Full                                                        +---------+---------------+---------+-----------+----------+--------------+ FV Mid   Full                                                        +---------+---------------+---------+-----------+----------+--------------+ FV DistalFull                                                        +---------+---------------+---------+-----------+----------+--------------+ PFV      Full                                                         +---------+---------------+---------+-----------+----------+--------------+ POP      Full           Yes      Yes                                 +---------+---------------+---------+-----------+----------+--------------+ PTV      Full                                                        +---------+---------------+---------+-----------+----------+--------------+   PERO     Full                                                        +---------+---------------+---------+-----------+----------+--------------+     Summary: BILATERAL: - No evidence of deep vein thrombosis seen in the lower extremities, bilaterally. -No evidence of popliteal cyst, bilaterally.   *See table(s) above for measurements and observations. Electronically signed by Christopher Clark MD on 07/05/2020 at 6:21:17 PM.    Final     Review of Systems  Unable to perform ROS: Intubated   Blood pressure (!) 143/80, pulse 94, temperature (!) 100.9 F (38.3 C), temperature source Axillary, resp. rate 18, height 5' 5" (1.651 m), weight 72.2 kg, SpO2 100 %.  Physical Exam: The patient is intubated and unable to follow commands.  She does not open her eyes.  Pupils are 3 mm and sluggish.  Bilateral corneal reflexes are impaired.  No blink to threat on the left.  She localizes to painful stimuli on the right.  Minimal withdrawal to noxious stimuli on the left.      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  Neurosurgery was consulted to evaluate patient for potential decompressive craniectomy.  After evaluating the patient, imaging, and discussing with her father, the decision was made to move forward with a stat right decompressive craniectomy.  Joshua L McDaniel, DNP, NP-C 07/06/2020, 11:37 AM     

## 2020-07-06 NOTE — Progress Notes (Signed)
Phlebotomy notified that sodium lab was due 07/05/2020 at 2300.

## 2020-07-06 NOTE — Progress Notes (Signed)
Referring Physician(s): Code stroke - Aurora, A. (neuro)  Supervising Physician: Julieanne Cotton  Patient Status:  Brandi Myers  Chief Complaint: stroke f/u  Subjective:  History of acute CVA s/p cerebral arteriogram and revascularization of terminal ICA and right MCA and right ACA achieving TICI 2B revascularization on 07/04/20 with Dr. Corliss Skains.  Patient laying in bed resting comfortably - patient is intubated and sedated, not able to follow commands. Does not respond to painful stimuli.   Allergies: Norco [hydrocodone-acetaminophen]  Medications: Prior to Admission medications   Not on File     Vital Signs: BP 135/79 (BP Location: Left Arm)   Pulse 95   Temp (!) 100.9 F (38.3 C) (Axillary)   Resp 18   Ht 5\' 5"  (1.651 m)   Wt 159 lb 2.8 oz (72.2 kg)   SpO2 100%   BMI 26.49 kg/m   Physical Exam Vitals and nursing note reviewed.  Constitutional:      Comments: Intubated and sedated  Pulmonary:     Comments: Intubated  Skin:    Comments: Right groin puncture site clean, soft, dry, intact  Neurological:     Comments: Patient intubates and sedated Does not respond to painful stimuli  PERRL Distal pulses 2+      Imaging: CT Angio Head W/Cm &/Or Wo Cm  Result Date: 07/04/2020 CLINICAL DATA:  Left-sided weakness and slurred speech EXAM: CT HEAD WITHOUT CONTRAST CT ANGIOGRAPHY HEAD AND NECK CT PERFUSION BRAIN TECHNIQUE: Multi detector CT imaging of the head was performed using the standard protocol without intravenous contrast. Multidetector CT imaging of the head and neck was performed using the standard protocol during bolus administration of intravenous contrast. Multiplanar CT image reconstructions and MIPs were obtained to evaluate the vascular anatomy. Carotid stenosis measurements (when applicable) are obtained utilizing NASCET criteria, using the distal internal carotid diameter as the denominator. Multiphase CT imaging of the brain was performed  following IV bolus contrast injection. Subsequent parametric perfusion maps were calculated using RAPID software. CONTRAST:  OMNIPAQUE IOHEXOL 350 MG/ML SOLN COMPARISON:  None. FINDINGS: CT HEAD FINDINGS Brain: There is no acute intracranial hemorrhage or mass effect. Loss of gray-white differentiation involving a portion of the right lentiform nucleus. Ventricles and sulci are normal in size and configuration. No extra-axial collection. Vascular: No hyperdense vessel. Skull: Unremarkable. Sinuses/Orbits: Lobular maxillary sinus mucosal thickening. Orbits are unremarkable. Other: Minor patchy left mastoid tip opacification. ASPECTS [facility] Stroke Program Early CT Score) - Ganglionic level infarction (caudate, lentiform nuclei, internal capsule, insula, M1-M3 cortex): 6 - Supraganglionic infarction (M4-M6 cortex): 3 Total score (0-10 with 10 being normal): 9 CTA NECK FINDINGS Aortic arch: Normal caliber aorta.  Great vessel origins are patent. Right carotid system: Common carotid is patent. There is thrombus at the bifurcation extending into the external carotid. Less than 50% stenosis at the ICA origin. The cervical ICA is relatively smaller in caliber. Left carotid system: Patent.  No stenosis. Vertebral arteries: Patent. Right vertebral artery slightly dominant. No stenosis. Skeleton: Unremarkable. Other neck: Thyroid not well evaluated due to streak artifact. Likely reactive cervical lymph nodes. Upper chest: Included lungs are clear. Endotracheal tube is present. Review of the MIP images confirms the above findings CTA HEAD FINDINGS Anterior circulation: Proximal intracranial internal carotid artery is patent. There is loss of enhancement at the level of the supraclinoid portion. Faint enhancement is seen along the right M1 MCA. There is reconstitution of M2 branches. Patent right A1 ACA. Anterior communicating artery is present. Left  anterior and middle cerebral arteries are patent. Posterior  circulation: Intracranial vertebral arteries, basilar artery, and posterior cerebral arteries are patent. Venous sinuses: As permitted by contrast timing, patent. Review of the MIP images confirms the above findings CT Brain Perfusion Findings: CBF (<30%) Volume: 51mL Perfusion (Tmax>6.0s) volume: Mismatch Volume: 63mL Infarction Location: Right MCA territory IMPRESSION: No acute intracranial hemorrhage. 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 Reconstitution of right MCA at the bifurcation. Right A1 ACA is patent. Anterior communicating artery is present. No posterior communicating artery is identified. Perfusion imaging demonstrates core infarction of 51 mL. This is greater than anticipated by noncontrast CT. Penumbra is 63 mL and therefore may be greater than calculated. Initial results were called by telephone at the time of interpretation on 07/04/2020 at 1:58 pm to provider Dr. Iver Nestle, who verbally acknowledged these results. Electronically Signed   By: Guadlupe Spanish M.D.   On: 07/04/2020 14:10   DG Chest 1 View  Result Date: 07/04/2020 CLINICAL DATA:  Intubation EXAM: CHEST  1 VIEW COMPARISON:  01/12/2016 FINDINGS: Endotracheal tube is 3.6 cm above the carina. NG tube is in the stomach. No confluent opacities or effusions. Heart is normal size. No acute bony abnormality. IMPRESSION: Support devices in expected position. No acute cardiopulmonary disease. Electronically Signed   By: Charlett Nose M.D.   On: 07/04/2020 15:09   CT HEAD WO CONTRAST  Result Date: 07/06/2020 CLINICAL DATA:  Follow-up intracranial hemorrhage EXAM: CT HEAD WITHOUT CONTRAST TECHNIQUE: Contiguous axial images were obtained from the base of the skull through the vertex  without intravenous contrast. COMPARISON:  Yesterday FINDINGS: Brain: Cytotoxic edema involving cortex and basal ganglia in the right MCA territory with hazy central hemorrhage that is non progressed. Midline shift measures up to 1 cm. Unchanged dilatation of the left lateral ventricle attributed to the degree of midline shift and mass effect, with transependymal flow. Vascular: Stable Skull: Stable Sinuses/Orbits: Nasal septal perforation and patchy sinus opacification. IMPRESSION: 1. Unchanged right cerebral cytotoxic edema with central hemorrhage. Midline shift is similar at 1 cm. 2. Unchanged left lateral ventricle dilatation with evidence of transependymal flow. Electronically Signed   By: Marnee Spring M.D.   On: 07/06/2020 08:19   CT HEAD WO CONTRAST  Result Date: 07/05/2020 CLINICAL DATA:  Follow-up examination for acute stroke. EXAM: CT HEAD WITHOUT CONTRAST TECHNIQUE: Contiguous axial images were obtained from the base of the skull through the vertex without intravenous contrast. COMPARISON:  Comparison made with prior MRI from earlier the same day. FINDINGS: Brain: Continued interval evolution of right MCA distribution infarct, stable from prior MRI. Associated hemorrhagic transformation with hyperdense blood products throughout the area of infarction. Worsened regional mass effect and edema, with progressive effacement of the right lateral ventricle. Associated right-to-left midline shift has increased now measuring up to 9 mm. Increased dilatation of the right lateral ventricle concerning for developing ventricular trapping. Basilar cisterns remain patent. No transtentorial herniation. No other acute large vessel territory infarct or hemorrhage. No extra-axial collection. Vascular: No definite hyperdense vessel. Skull: Scalp soft tissues and calvarium within normal limits. Sinuses/Orbits: Visualized globes and orbital soft tissues within normal limits. Scattered mucosal thickening throughout the  ethmoidal air cells, maxillary sinuses, and sphenoid sinuses. Small bilateral mastoid effusions noted. Other: None. IMPRESSION: 1. Continued interval  evolution of right MCA distribution infarct with associated hemorrhagic transformation. Worsened regional mass effect and edema with increased right-to-left midline shift now measuring up to 9 mm. Increased dilatation of the right lateral ventricle concerning for developing ventricular trapping. 2. No other new acute intracranial abnormality. Critical Value/emergent results were called by telephone at the time of interpretation on 07/05/2020 at 11:05 pm to provider Dr. Wilford Corner, who verbally acknowledged these results. Electronically Signed   By: Rise Mu M.D.   On: 07/05/2020 23:06   CT Angio Neck W and/or Wo Contrast  Result Date: 07/04/2020 CLINICAL DATA:  Left-sided weakness and slurred speech EXAM: CT HEAD WITHOUT CONTRAST CT ANGIOGRAPHY HEAD AND NECK CT PERFUSION BRAIN TECHNIQUE: Multi detector CT imaging of the head was performed using the standard protocol without intravenous contrast. Multidetector CT imaging of the head and neck was performed using the standard protocol during bolus administration of intravenous contrast. Multiplanar CT image reconstructions and MIPs were obtained to evaluate the vascular anatomy. Carotid stenosis measurements (when applicable) are obtained utilizing NASCET criteria, using the distal internal carotid diameter as the denominator. Multiphase CT imaging of the brain was performed following IV bolus contrast injection. Subsequent parametric perfusion maps were calculated using RAPID software. CONTRAST:  OMNIPAQUE IOHEXOL 350 MG/ML SOLN COMPARISON:  None. FINDINGS: CT HEAD FINDINGS Brain: There is no acute intracranial hemorrhage or mass effect. Loss of gray-white differentiation involving a portion of the right lentiform nucleus. Ventricles and sulci are normal in size and configuration. No extra-axial  collection. Vascular: No hyperdense vessel. Skull: Unremarkable. Sinuses/Orbits: Lobular maxillary sinus mucosal thickening. Orbits are unremarkable. Other: Minor patchy left mastoid tip opacification. ASPECTS [facility] Stroke Program Early CT Score) - Ganglionic level infarction (caudate, lentiform nuclei, internal capsule, insula, M1-M3 cortex): 6 - Supraganglionic infarction (M4-M6 cortex): 3 Total score (0-10 with 10 being normal): 9 CTA NECK FINDINGS Aortic arch: Normal caliber aorta.  Great vessel origins are patent. Right carotid system: Common carotid is patent. There is thrombus at the bifurcation extending into the external carotid. Less than 50% stenosis at the ICA origin. The cervical ICA is relatively smaller in caliber. Left carotid system: Patent.  No stenosis. Vertebral arteries: Patent. Right vertebral artery slightly dominant. No stenosis. Skeleton: Unremarkable. Other neck: Thyroid not well evaluated due to streak artifact. Likely reactive cervical lymph nodes. Upper chest: Included lungs are clear. Endotracheal tube is present. Review of the MIP images confirms the above findings CTA HEAD FINDINGS Anterior circulation: Proximal intracranial internal carotid artery is patent. There is loss of enhancement at the level of the supraclinoid portion. Faint enhancement is seen along the right M1 MCA. There is reconstitution of M2 branches. Patent right A1 ACA. Anterior communicating artery is present. Left anterior and middle cerebral arteries are patent. Posterior circulation: Intracranial vertebral arteries, basilar artery, and posterior cerebral arteries are patent. Venous sinuses: As permitted by contrast timing, patent. Review of the MIP images confirms the above findings CT Brain Perfusion Findings: CBF (<30%) Volume: 51mL Perfusion (Tmax>6.0s) volume: Mismatch Volume: 63mL Infarction Location: Right MCA territory IMPRESSION: No acute intracranial hemorrhage. Acute infarction involving the  right lentiform nucleus (ASPECT score 9). 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 Reconstitution of right MCA at the bifurcation. Right A1 ACA is patent. Anterior communicating artery is present. No posterior communicating artery is identified. Perfusion imaging demonstrates core  infarction of 51 mL. This is greater than anticipated by noncontrast CT. Penumbra is 63 mL and therefore may be greater than calculated. Initial results were called by telephone at the time of interpretation on 07/04/2020 at 1:58 pm to provider Dr. Iver Nestle, who verbally acknowledged these results. Electronically Signed   By: Guadlupe Spanish M.D.   On: 07/04/2020 14:10   MR ANGIO HEAD WO CONTRAST  Result Date: 07/05/2020 CLINICAL DATA:  Stroke.  Post thrombectomy and tPA EXAM: MRI HEAD WITHOUT CONTRAST MRA HEAD WITHOUT CONTRAST TECHNIQUE: Multiplanar, multiecho pulse sequences of the brain and surrounding structures were obtained without intravenous contrast. Angiographic images of the head were obtained using MRA technique without contrast. COMPARISON:  CT angio head and neck 07/04/2020 FINDINGS: MRI HEAD FINDINGS Brain: Acute infarct right MCA territory. This involves much of the right basal ganglia as well as the right frontal and temporal lobe. There is hemorrhagic transformation with mild-to-moderate blood in the infarct. There is local mass-effect and midline shift of 6 mm. No hydrocephalus. No evidence of chronic ischemia. No mass lesion. Vascular: Normal arterial flow voids at the base of brain. Skull and upper cervical spine: No focal skeletal abnormality. Sinuses/Orbits: Moderate mucosal edema paranasal sinuses. Bilateral mastoid effusion normal orbit Other: None MRA HEAD FINDINGS Both vertebral arteries are normal. Left PICA patent. Large right  AICA patent. Basilar patent. Superior cerebellar and posterior cerebral arteries normal bilaterally. Internal carotid artery is widely patent bilaterally without stenosis or filling defect. Anterior and middle cerebral arteries widely patent bilaterally without stenosis or occlusion. No aneurysm. 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 Hemorrhagic transformation with mild to moderate hemorrhage in the infarct. There is mass-effect and 6 mm midline shift to left Negative MRA head Electronically Signed   By: Marlan Palau M.D.   On: 07/05/2020 10:01   MR BRAIN WO CONTRAST  Result Date: 07/05/2020 CLINICAL DATA:  Stroke.  Post thrombectomy and tPA EXAM: MRI HEAD WITHOUT CONTRAST MRA HEAD WITHOUT CONTRAST TECHNIQUE: Multiplanar, multiecho pulse sequences of the brain and surrounding structures were obtained without intravenous contrast. Angiographic images of the head were obtained using MRA technique without contrast. COMPARISON:  CT angio head and neck 07/04/2020 FINDINGS: MRI HEAD FINDINGS Brain: Acute infarct right MCA territory. This involves much of the right basal ganglia as well as the right frontal and temporal lobe. There is hemorrhagic transformation with mild-to-moderate blood in the infarct. There is local mass-effect and midline shift of 6 mm. No hydrocephalus. No evidence of chronic ischemia. No mass lesion. Vascular: Normal arterial flow voids at the base of brain. Skull and upper cervical spine: No focal skeletal abnormality. Sinuses/Orbits: Moderate mucosal edema paranasal sinuses. Bilateral mastoid effusion normal orbit Other: None MRA HEAD FINDINGS Both vertebral arteries are normal. Left PICA patent. Large right AICA patent. Basilar patent. Superior cerebellar and posterior cerebral arteries normal bilaterally. Internal carotid artery is widely patent bilaterally without stenosis or filling defect. Anterior and middle cerebral arteries  widely patent bilaterally without stenosis or occlusion. No aneurysm. 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 Hemorrhagic transformation with mild to moderate hemorrhage in the infarct. There is mass-effect and 6 mm midline shift to left Negative MRA head Electronically Signed   By: Marlan Palau M.D.   On: 07/05/2020 10:01   CT CEREBRAL PERFUSION W CONTRAST  Result Date: 07/04/2020 CLINICAL DATA:  Left-sided weakness and slurred speech EXAM: CT HEAD WITHOUT CONTRAST CT ANGIOGRAPHY HEAD  AND NECK CT PERFUSION BRAIN TECHNIQUE: Multi detector CT imaging of the head was performed using the standard protocol without intravenous contrast. Multidetector CT imaging of the head and neck was performed using the standard protocol during bolus administration of intravenous contrast. Multiplanar CT image reconstructions and MIPs were obtained to evaluate the vascular anatomy. Carotid stenosis measurements (when applicable) are obtained utilizing NASCET criteria, using the distal internal carotid diameter as the denominator. Multiphase CT imaging of the brain was performed following IV bolus contrast injection. Subsequent parametric perfusion maps were calculated using RAPID software. CONTRAST:  OMNIPAQUE IOHEXOL 350 MG/ML SOLN COMPARISON:  None. FINDINGS: CT HEAD FINDINGS Brain: There is no acute intracranial hemorrhage or mass effect. Loss of gray-white differentiation involving a portion of the right lentiform nucleus. Ventricles and sulci are normal in size and configuration. No extra-axial collection. Vascular: No hyperdense vessel. Skull: Unremarkable. Sinuses/Orbits: Lobular maxillary sinus mucosal thickening. Orbits are unremarkable. Other: Minor patchy left mastoid tip opacification. ASPECTS [facility] Stroke Program Early CT Score) - Ganglionic level infarction (caudate, lentiform nuclei, internal capsule, insula, M1-M3 cortex): 6 - Supraganglionic  infarction (M4-M6 cortex): 3 Total score (0-10 with 10 being normal): 9 CTA NECK FINDINGS Aortic arch: Normal caliber aorta.  Great vessel origins are patent. Right carotid system: Common carotid is patent. There is thrombus at the bifurcation extending into the external carotid. Less than 50% stenosis at the ICA origin. The cervical ICA is relatively smaller in caliber. Left carotid system: Patent.  No stenosis. Vertebral arteries: Patent. Right vertebral artery slightly dominant. No stenosis. Skeleton: Unremarkable. Other neck: Thyroid not well evaluated due to streak artifact. Likely reactive cervical lymph nodes. Upper chest: Included lungs are clear. Endotracheal tube is present. Review of the MIP images confirms the above findings CTA HEAD FINDINGS Anterior circulation: Proximal intracranial internal carotid artery is patent. There is loss of enhancement at the level of the supraclinoid portion. Faint enhancement is seen along the right M1 MCA. There is reconstitution of M2 branches. Patent right A1 ACA. Anterior communicating artery is present. Left anterior and middle cerebral arteries are patent. Posterior circulation: Intracranial vertebral arteries, basilar artery, and posterior cerebral arteries are patent. Venous sinuses: As permitted by contrast timing, patent. Review of the MIP images confirms the above findings CT Brain Perfusion Findings: CBF (<30%) Volume: 51mL Perfusion (Tmax>6.0s) volume: Mismatch Volume: 63mL Infarction Location: Right MCA territory IMPRESSION: No acute intracranial hemorrhage. 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 Reconstitution of right MCA at the bifurcation. Right A1 ACA is patent. Anterior communicating artery  is present. No posterior communicating artery is identified. Perfusion imaging demonstrates core infarction of 51 mL. This is greater than anticipated by noncontrast CT. Penumbra is 63 mL and therefore may be greater than calculated. Initial results were called by telephone at the time of interpretation on 07/04/2020 at 1:58 pm to provider Dr. Iver Nestle, who verbally acknowledged these results. Electronically Signed   By: Guadlupe Spanish M.D.   On: 07/04/2020 14:10   DG Abd Portable 1 View  Result Date: 07/04/2020 CLINICAL DATA:  OG tube placement. EXAM: PORTABLE ABDOMEN - 1 VIEW COMPARISON:  No prior. FINDINGS: OG tube noted with tip over the stomach. No gastric or bowel distention noted. Contrast noted both kidneys. Degenerative change lumbar spine. Surgical clips right  upper quadrant. IMPRESSION: OG tube noted with tip over the stomach. No gastric or bowel distention noted. Electronically Signed   By: Maisie Fushomas  Register   On: 07/04/2020 15:13   VAS US UPPER EXTREMITY ARTERIAL DUPLEX  Result Date: 07/06/2020 UPPER EXTREMITY DUPLEX STUDY Indications: Patient complains of RUE - cool to touch.  Risk Factors: Prior CVA. Performing Technologist: Ernestene MentionJody Hill  Examination Guidelines: A complete evaluation includes B-mode imaging, spectral Doppler, color Doppler, and power Doppler as needed of all accessible portions of each vessel. Bilateral testing is considered an integral part of a complete examination. Limited examinations for reoccurring indications may be performed as noted.  Right Doppler Findings: +---------------+----------+----------+--------+--------+ Site           PSV (cm/s)Waveform  StenosisComments +---------------+----------+----------+--------+--------+ Subclavian Prox81        triphasic                  +---------------+----------+----------+--------+--------+ Subclavian Dist          absent                     +---------------+----------+----------+--------+--------+ Axillary        63        triphasic                  +---------------+----------+----------+--------+--------+ Brachial Prox  51        biphasic                   +---------------+----------+----------+--------+--------+ Brachial Mid   33        biphasic                   +---------------+----------+----------+--------+--------+ Brachial Dist  14        monophasicoccluded         +---------------+----------+----------+--------+--------+ Radial Prox    0         absent    occluded         +---------------+----------+----------+--------+--------+ Radial Mid     0         absent    occluded         +---------------+----------+----------+--------+--------+ Radial Dist    0         absent    occluded         +---------------+----------+----------+--------+--------+ Ulnar Prox     0         absent    occluded         +---------------+----------+----------+--------+--------+ Ulnar Mid      0         absent                     +---------------+----------+----------+--------+--------+ Ulnar Dist     0         absent                     +---------------+----------+----------+--------+--------+ Incidental finding: RUE cephalic SVT.   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 Vascular consult recommended.    Preliminary    ECHOCARDIOGRAM COMPLETE  Result Date: 07/05/2020    ECHOCARDIOGRAM REPORT   Patient Name:   Brandi Myers Date of Exam: 07/05/2020 Medical Rec #:  [id number]       Height:       65.0 in Accession #:    [id number]  Weight:       159.2 lb Date of Birth:  08-15-1985      BSA:          1.795 m Patient Age:    34 years        BP:           130/91 mmHg Patient Gender: F               HR:           115 bpm. Exam Location:  Inpatient Procedure: 2D Echo, Cardiac Doppler and Color Doppler Indications:    Stroke 434.91  History:        Patient has no prior  history of Echocardiogram examinations.                 Risk Factors:Hypertension and Diabetes.  Sonographer:    Elmarie Shiley Dance Referring Phys: [id number] ERIC LINDZEN IMPRESSIONS  1. Left ventricular ejection fraction, by estimation, is 60 to 65%. The left ventricle has normal function. The left ventricle has no regional wall motion abnormalities. Left ventricular diastolic parameters are consistent with Grade I diastolic dysfunction (impaired relaxation).  2. Right ventricular systolic function is normal. The right ventricular size is normal.  3. The mitral valve is normal in structure. Trivial mitral valve regurgitation. No evidence of mitral stenosis.  4. The aortic valve is normal in structure. Aortic valve regurgitation is not visualized. No aortic stenosis is present.  5. The inferior vena cava is normal in size with greater than 50% respiratory variability, suggesting right atrial pressure of 3 mmHg. Conclusion(s)/Recommendation(s): No intracardiac source of embolism detected on this transthoracic study. A transesophageal echocardiogram is recommended to exclude cardiac source of embolism if clinically indicated. FINDINGS  Left Ventricle: Left ventricular ejection fraction, by estimation, is 60 to 65%. The left ventricle has normal function. The left ventricle has no regional wall motion abnormalities. The left ventricular internal cavity size was normal in size. There is  no left ventricular hypertrophy. Left ventricular diastolic parameters are consistent with Grade I diastolic dysfunction (impaired relaxation). Normal left ventricular filling pressure. Right Ventricle: The right ventricular size is normal. No increase in right ventricular wall thickness. Right ventricular systolic function is normal. Left Atrium: Left atrial size was normal in size. Right Atrium: Right atrial size was normal in size. Pericardium: There is no evidence of pericardial effusion. Mitral Valve: The mitral valve is normal in structure.  Trivial mitral valve regurgitation. No evidence of mitral valve stenosis. Tricuspid Valve: The tricuspid valve is normal in structure. Tricuspid valve regurgitation is trivial. No evidence of tricuspid stenosis. Aortic Valve: The aortic valve is normal in structure. Aortic valve regurgitation is not visualized. No aortic stenosis is present. Pulmonic Valve: The pulmonic valve was normal in structure. Pulmonic valve regurgitation is not visualized. No evidence of pulmonic stenosis. Aorta: The aortic root is normal in size and structure. Venous: The inferior vena cava is normal in size with greater than 50% respiratory variability, suggesting right atrial pressure of 3 mmHg. IAS/Shunts: No atrial level shunt detected by color flow Doppler.  LEFT VENTRICLE PLAX 2D LVIDd:         4.20 cm  Diastology LVIDs:         3.20 cm  LV e' medial:    10.30 cm/s LV PW:         0.90 cm  LV E/e' medial:  8.5 LV IVS:        0.80 cm  LV e' lateral:  15.00 cm/s LVOT diam:     2.10 cm  LV E/e' lateral: 5.9 LV SV:         55 LV SV Index:   31 LVOT Area:     3.46 cm  RIGHT VENTRICLE             IVC RV Basal diam:  2.40 cm     IVC diam: 1.40 cm RV S prime:     13.40 cm/s TAPSE (M-mode): 2.2 cm LEFT ATRIUM             Index       RIGHT ATRIUM          Index LA diam:        2.70 cm 1.50 cm/m  RA Area:     8.48 cm LA Vol (A2C):   27.5 ml 15.32 ml/m RA Volume:   17.50 ml 9.75 ml/m LA Vol (A4C):   18.7 ml 10.42 ml/m LA Biplane Vol: 23.2 ml 12.92 ml/m  AORTIC VALVE LVOT Vmax:   99.20 cm/s LVOT Vmean:  66.100 cm/s LVOT VTI:    0.160 m  AORTA Ao Root diam: 3.20 cm Ao Asc diam:  2.60 cm MITRAL VALVE MV Area (PHT): 2.97 cm    SHUNTS MV Decel Time: 256 msec    Systemic VTI:  0.16 m MV E velocity: 88.05 cm/s  Systemic Diam: 2.10 cm Tobias Alexander MD Electronically signed by Tobias Alexander MD Signature Date/Time: 07/05/2020/12:43:53 PM    Final    IR PERCUTANEOUS ART THROMBECTOMY/INFUSION INTRACRANIAL INC DIAG ANGIO  Result Date:  07/06/2020 INDICATION: New onset of left-sided weakness, right gaze deviation and slurred speech. 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 EXAM: 1. EMERGENT LARGE VESSEL OCCLUSION THROMBOLYSIS (anterior CIRCULATION) COMPARISON:  CT angiogram of the head and neck of July 04, 2020. MEDICATIONS: Ancef 2 g IV antibiotic was administered within 1 hour of the procedure. ANESTHESIA/SEDATION: General anesthesia CONTRAST:  Isovue 300 105 mL. FLUOROSCOPY TIME:  Fluoroscopy Time: 24 minutes 0 seconds (1208 mGy). COMPLICATIONS: None immediate. TECHNIQUE: Two physician emergency consent was obtained in view of the non availability of patient's family or next of kin physically or via phone. The patient was then put under general anesthesia by the Department of Anesthesiology at [facility]. The right groin groin was prepped and draped in the usual sterile fashion. Thereafter using modified Seldinger technique, transfemoral access into the right common femoral artery was obtained without difficulty. Over a 0.035 inch guidewire an 8 French 25 cm Pinnacle sheath was inserted. Through this, and also over a 0.035 inch guidewire a 5 [place] JB 1 catheter was advanced to the aortic arch region and selectively positioned in the left common carotid artery, the right vertebral artery and the right common carotid artery. FINDINGS: The left common carotid arteriogram demonstrates the left external carotid artery and its major branches to be widely patent. The left internal carotid artery at the bulb to the cranial skull base is widely patent. The petrous, the cavernous and the supraclinoid segments are widely patent. There is an approximately 3 mm outpouching in the left posterior communicating artery region which may represent an infundibulum versus an aneurysm. The left middle cerebral artery and the left  anterior cerebral artery opacify into the capillary and venous phases. Prompt cross-filling via the anterior communicating artery of the right anterior cerebral A2 segment and distally,  and the right anterior cerebral A1 segment and the distal A2 region is noted. The right common carotid arteriogram demonstrates the right external carotid artery to be occluded. There is a meniscal filling defect right at the origin of the right external carotid artery. Minimal extrusion was seen into the right internal carotid artery at the bulb. More distally the right internal carotid artery is seen to opacify to the cranial skull base. Patency is seen of the petrous and the caval and proximal cavernous segments. 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 The right vertebral artery origin is widely patent. The vessel is seen to opacify to the cranial skull base. More distally patency is seen of the right vertebrobasilar junction and the right posterior-inferior cerebellar artery. The basilar artery, the posterior cerebral arteries, the superior cerebellar arteries and the anterior-inferior cerebellar arteries demonstrate wide patency into the capillary and venous phases. 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 PROCEDURE: The diagnostic JB 1 catheter in right common carotid artery was then exchanged over a 0.035 inch 300 cm Rosen exchange guidewire for an 087 balloon guide catheter which was positioned in the mid cervical right ICA. The guidewire was removed. Good aspiration obtained from the hub of the balloon guide catheter. A control arteriogram performed through this demonstrated no evidence of spasms, dissections or of intraluminal filling defects. Over a 0.014 inch standard Synchro micro guidewire with a J configuration the combination of an 071 136 cm Zoom aspiration catheter inside of  which was an 021 162 cm Trevo ProVue microcatheter was advanced to the level of the ipsilateral ophthalmic artery. Thereafter, the micro guidewire was gently manipulated without difficulty through the occluded right internal carotid artery supraclinoid segment into the right middle cerebral artery and into the M2 M3 segment of the inferior division of the right middle cerebral artery. This was followed by advancement of the microcatheter. The guidewire was removed. Good aspiration obtained from the hub of the microcatheter. A gentle control arteriogram performed through this demonstrated antegrade flow into the distal M3 M4 segment of the inferior division of the right middle cerebral artery. This was then connected to continuous heparinized saline infusion. A 5 mm x 37 mm Embotrap retrieval device was then advanced to the distal end of the microcatheter and deployed in the usual manner. The Zoom aspiration catheter was advanced such that it was in the distal occluded right middle cerebral artery. With constant aspiration being applied at the hub of the Zoom aspiration catheter with a Penumbra aspiration device, and with a 20 mL syringe at the hub of the balloon guide, and proximal flow arrest over 2 minutes, the combination of the retrieval device, microcatheter, and the Zoom aspiration catheter were retrieved and removed. Following reversal of flow arrest, a control arteriogram performed through the balloon guide in the right internal carotid artery demonstrated complete revascularization of the occluded right internal carotid artery supraclinoid segment, the right middle cerebral artery superior and inferior anterior temporal branches. However, noted were occlusions of the M3 region of the superior division in its two perisylvian branches, and also of the inferior division in the distal M3 segment. Spasm in the right internal carotid artery was treated with 2 aliquots of 25 mcg of nitroglycerin with relief of the  vasospasm. Control arteriogram performed through the balloon guide in the right internal carotid artery demonstrated continued patency of the superior and the inferior divisions with  no change in the occlusion of the 2 anterior perisylvian branches of the superior division, and also of the inferior division of the M3 region which over a period of time demonstrated improved revascularization into the M4 regions. A TICI 2b revascularization was achieved. Given the gradual revascularization of the inferior division, no further endovascular treatment was attempted. The balloon guide was removed. An 8 French Angio-Seal closure device applied successfully at the right groin puncture site. Distal pulses continued to be Dopplerable in both feet unchanged. An intermediate CT of the brain demonstrated contrast stain +/-petechial hemorrhages in the posterior putamen on the right extending to the caudate head. No mass effect or midline shift was noted. The patient was then transferred to neuro ICU intubated for post revascularization care. 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 Persistent distal occlusion of the superior division anterior perisylvian branches, with gradual improvement revascularization of the dominant inferior division in the M3 M4 regions. PLAN: Follow-up as per referring neurologist. Electronically Signed   By: Julieanne Cotton M.D.   On: 07/05/2020 14:22   CT HEAD CODE STROKE WO CONTRAST  Result Date: 07/04/2020 CLINICAL DATA:  Left-sided weakness and slurred speech EXAM: CT HEAD WITHOUT CONTRAST CT ANGIOGRAPHY HEAD AND NECK CT PERFUSION BRAIN TECHNIQUE: Multi detector CT imaging of the head was performed using the standard protocol without intravenous contrast. Multidetector CT imaging of the head and neck was performed using the  standard protocol during bolus administration of intravenous contrast. Multiplanar CT image reconstructions and MIPs were obtained to evaluate the vascular anatomy. Carotid stenosis measurements (when applicable) are obtained utilizing NASCET criteria, using the distal internal carotid diameter as the denominator. Multiphase CT imaging of the brain was performed following IV bolus contrast injection. Subsequent parametric perfusion maps were calculated using RAPID software. CONTRAST:  OMNIPAQUE IOHEXOL 350 MG/ML SOLN COMPARISON:  None. FINDINGS: CT HEAD FINDINGS Brain: There is no acute intracranial hemorrhage or mass effect. Loss of gray-white differentiation involving a portion of the right lentiform nucleus. Ventricles and sulci are normal in size and configuration. No extra-axial collection. Vascular: No hyperdense vessel. Skull: Unremarkable. Sinuses/Orbits: Lobular maxillary sinus mucosal thickening. Orbits are unremarkable. Other: Minor patchy left mastoid tip opacification. ASPECTS [facility] Stroke Program Early CT Score) - Ganglionic level infarction (caudate, lentiform nuclei, internal capsule, insula, M1-M3 cortex): 6 - Supraganglionic infarction (M4-M6 cortex): 3 Total score (0-10 with 10 being normal): 9 CTA NECK FINDINGS Aortic arch: Normal caliber aorta.  Great vessel origins are patent. Right carotid system: Common carotid is patent. There is thrombus at the bifurcation extending into the external carotid. Less than 50% stenosis at the ICA origin. The cervical ICA is relatively smaller in caliber. Left carotid system: Patent.  No stenosis. Vertebral arteries: Patent. Right vertebral artery slightly dominant. No stenosis. Skeleton: Unremarkable. Other neck: Thyroid not well evaluated due to streak artifact. Likely reactive cervical lymph nodes. Upper chest: Included lungs are clear. Endotracheal tube is present. Review of the MIP images confirms the above findings CTA HEAD FINDINGS Anterior  circulation: Proximal intracranial internal carotid artery is patent. There is loss of enhancement at the level of the supraclinoid portion. Faint enhancement is seen along the right M1 MCA. There is reconstitution of M2 branches. Patent right A1 ACA. Anterior communicating artery is present. Left anterior and middle cerebral arteries are patent. Posterior circulation: Intracranial vertebral arteries, basilar artery, and posterior cerebral arteries  are patent. Venous sinuses: As permitted by contrast timing, patent. Review of the MIP images confirms the above findings CT Brain Perfusion Findings: CBF (<30%) Volume: 51mL Perfusion (Tmax>6.0s) volume: Mismatch Volume: 63mL Infarction Location: Right MCA territory IMPRESSION: No acute intracranial hemorrhage. 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 Reconstitution of right MCA at the bifurcation. Right A1 ACA is patent. Anterior communicating artery is present. No posterior communicating artery is identified. Perfusion imaging demonstrates core infarction of 51 mL. This is greater than anticipated by noncontrast CT. Penumbra is 63 mL and therefore may be greater than calculated. Initial results were called by telephone at the time of interpretation on 07/04/2020 at 1:58 pm to provider Dr. Iver Nestle, who verbally acknowledged these results. Electronically Signed   By: Guadlupe Spanish M.D.   On: 07/04/2020 14:10   VAS [place] LOWER EXTREMITY VENOUS (DVT)  Result Date: 07/05/2020  Lower Venous DVT Study Indications: Embolic stroke.  Comparison Study: No prior Performing Technologist: Marilynne Halsted RDMS, RVT  Examination Guidelines: A complete evaluation includes B-mode imaging, spectral Doppler, color Doppler, and  power Doppler as needed of all accessible portions of each vessel. Bilateral testing is considered an integral part of a complete examination. Limited examinations for reoccurring indications may be performed as noted. The reflux portion of the exam is performed with the patient in reverse Trendelenburg.  +---------+---------------+---------+-----------+----------+--------------+ RIGHT    CompressibilityPhasicitySpontaneityPropertiesThrombus Aging +---------+---------------+---------+-----------+----------+--------------+ CFV      Full           Yes      Yes                                 +---------+---------------+---------+-----------+----------+--------------+ SFJ      Full                                                        +---------+---------------+---------+-----------+----------+--------------+ FV Prox  Full                                                        +---------+---------------+---------+-----------+----------+--------------+ FV Mid   Full                                                        +---------+---------------+---------+-----------+----------+--------------+ FV DistalFull                                                        +---------+---------------+---------+-----------+----------+--------------+ PFV      Full                                                        +---------+---------------+---------+-----------+----------+--------------+  POP      Full           Yes      Yes                                 +---------+---------------+---------+-----------+----------+--------------+ PTV      Full                                                        +---------+---------------+---------+-----------+----------+--------------+ PERO     Full                                                        +---------+---------------+---------+-----------+----------+--------------+    +---------+---------------+---------+-----------+----------+--------------+ LEFT     CompressibilityPhasicitySpontaneityPropertiesThrombus Aging +---------+---------------+---------+-----------+----------+--------------+ CFV      Full           Yes      Yes                                 +---------+---------------+---------+-----------+----------+--------------+ SFJ      Full                                                        +---------+---------------+---------+-----------+----------+--------------+ FV Prox  Full                                                        +---------+---------------+---------+-----------+----------+--------------+ FV Mid   Full                                                        +---------+---------------+---------+-----------+----------+--------------+ FV DistalFull                                                        +---------+---------------+---------+-----------+----------+--------------+ PFV      Full                                                        +---------+---------------+---------+-----------+----------+--------------+ POP      Full           Yes      Yes                                 +---------+---------------+---------+-----------+----------+--------------+  PTV      Full                                                        +---------+---------------+---------+-----------+----------+--------------+ PERO     Full                                                        +---------+---------------+---------+-----------+----------+--------------+     Summary: BILATERAL: - No evidence of deep vein thrombosis seen in the lower extremities, bilaterally. -No evidence of popliteal cyst, bilaterally.   *See table(s) above for measurements and observations. Electronically signed by Sherald Hess MD on 07/05/2020 at 6:21:17 PM.    Final     Labs:  CBC: Recent Labs    07/04/20 1247  07/04/20 1844 07/05/20 0541 07/06/20 0240  WBC 17.8*  --  14.2* 12.7*  HGB 9.5* 9.9* 8.6* 7.4*  HCT 35.0* 29.0* 32.0* 28.7*  PLT 235  --  160 123*    COAGS: Recent Labs    07/04/20 1337  INR 1.2  APTT 24    BMP: Recent Labs    07/04/20 1337 07/04/20 1844 07/04/20 2304 07/05/20 0541 07/06/20 0240  NA 133* 140  --  141 144  K 2.9* 3.1* 3.6 3.1* 4.1  CL 99  --   --  107 117*  CO2 21*  --   --  25 19*  GLUCOSE 448*  --   --  147* 125*  BUN <5*  --   --  <5* 5*  CALCIUM 8.3*  --   --  8.1* 8.0*  CREATININE 0.59  --   --  0.61 0.59  GFRNONAA >60  --   --  >60 >60    LIVER FUNCTION TESTS: Recent Labs    07/04/20 1337  BILITOT 1.0  AST 23  ALT 14  ALKPHOS 84  PROT 6.6  ALBUMIN 3.3*    Assessment and Plan:  History of acute CVA s/p cerebral arteriogram and revascularization of terminal ICA and right MCA and right ACA achieving TICI 2B revascularization on 07/04/20 with Dr. Corliss Skains.  Patient laying in bed resting comfortably - patient is intubated and sedated, not able to follow commands. Does not respond to painful stimuli.   Further plan per neuro - possible surgery today with neurosurgery Appreciate and agree with the treatment Please call NIR for concerns and questions  Electronically Signed: Willette Brace, PA-C 07/06/2020, 9:41 AM   I spent a total of 15 Minutes at the the patient's bedside AND on the patient's hospital floor or unit, greater than 50% of which was counseling/coordinating care for CVA s/p revascularization

## 2020-07-06 NOTE — Progress Notes (Signed)
Transitions of Care Team following this patient for discharge planning.  Currently, patient not medically stable; will continue to follow as patient progresses.    Tyriana Helmkamp LCSW  

## 2020-07-06 NOTE — Progress Notes (Signed)
Peripherally Inserted Central Catheter Placement  The IV Nurse has discussed with the patient and/or persons authorized to consent for the patient, the purpose of this procedure and the potential benefits and risks involved with this procedure.  The benefits include less needle sticks, lab draws from the catheter, and the patient may be discharged home with the catheter. Risks include, but not limited to, infection, bleeding, blood clot (thrombus formation), and puncture of an artery; nerve damage and irregular heartbeat and possibility to perform a PICC exchange if needed/ordered by physician.  Alternatives to this procedure were also discussed.  Bard Power PICC patient education guide, fact sheet on infection prevention and patient information card has been provided to patient /or left at bedside.  Consent obtain from father  PICC Placement Documentation  PICC Double Lumen 07/06/20 PICC Left Basilic 42 cm 0 cm (Active)  Indication for Insertion or Continuance of Line Vasoactive infusions 07/06/20 1551  Exposed Catheter (cm) 0 cm 07/06/20 1551  Site Assessment Clean;Dry;Intact 07/06/20 1551  Lumen #1 Status Flushed;Saline locked;Blood return noted 07/06/20 1551  Lumen #2 Status Flushed;Saline locked;Blood return noted 07/06/20 1551  Dressing Type Transparent 07/06/20 1551  Dressing Status Clean;Dry;Intact 07/06/20 1551  Antimicrobial disc in place? Yes 07/06/20 1551  Dressing Intervention New dressing 07/06/20 1551  Dressing Change Due 07/13/20 07/06/20 1551       Ethelda Chick 07/06/2020, 3:52 PM

## 2020-07-06 NOTE — Anesthesia Preprocedure Evaluation (Signed)
Anesthesia Evaluation  Patient identified by MRN, date of birth, ID band Patient unresponsive    Reviewed: Allergy & Precautions, NPO status , Patient's Chart, lab work & pertinent test results  Airway Mallampati: Intubated       Dental no notable dental hx.    Pulmonary Current Smoker,    breath sounds clear to auscultation       Cardiovascular hypertension,  Rhythm:Regular Rate:Tachycardia     Neuro/Psych    GI/Hepatic   Endo/Other  diabetes  Renal/GU      Musculoskeletal   Abdominal Normal abdominal exam  (+)   Peds  Hematology  (+) Blood dyscrasia, anemia ,   Anesthesia Other Findings   Reproductive/Obstetrics                             Anesthesia Physical Anesthesia Plan  ASA: III  Anesthesia Plan: General   Post-op Pain Management:    Induction: Inhalational  PONV Risk Score and Plan:   Airway Management Planned: Oral ETT  Additional Equipment: Arterial line  Intra-op Plan:   Post-operative Plan: Post-operative intubation/ventilation  Informed Consent: I have reviewed the patients History and Physical, chart, labs and discussed the procedure including the risks, benefits and alternatives for the proposed anesthesia with the patient or authorized representative who has indicated his/her understanding and acceptance.       Plan Discussed with: CRNA  Anesthesia Plan Comments:         Anesthesia Quick Evaluation

## 2020-07-06 NOTE — Transfer of Care (Signed)
Immediate Anesthesia Transfer of Care Note  Patient: Brandi Myers  Procedure(s) Performed: RIGHT CRANIECTOMY WITH PLACEMENT OF BONE FLAP IN ABDOMEN (Right )  Patient Location: ICU  Anesthesia Type:General  Level of Consciousness: Patient remains intubated per anesthesia plan  Airway & Oxygen Therapy: Patient remains intubated per anesthesia plan and Patient placed on Ventilator (see vital sign flow sheet for setting)  Post-op Assessment: Report given to RN  Post vital signs: Reviewed and stable  Last Vitals:  Vitals Value Taken Time  BP    Temp    Pulse    Resp    SpO2      Last Pain:  Vitals:   07/06/20 0800  TempSrc: Axillary         Complications: No complications documented.

## 2020-07-07 ENCOUNTER — Inpatient Hospital Stay (HOSPITAL_COMMUNITY): Payer: Medicaid Other

## 2020-07-07 DIAGNOSIS — I6601 Occlusion and stenosis of right middle cerebral artery: Secondary | ICD-10-CM | POA: Diagnosis not present

## 2020-07-07 DIAGNOSIS — I639 Cerebral infarction, unspecified: Secondary | ICD-10-CM | POA: Diagnosis not present

## 2020-07-07 LAB — BASIC METABOLIC PANEL
Anion gap: 6 (ref 5–15)
BUN: 9 mg/dL (ref 6–20)
CO2: 23 mmol/L (ref 22–32)
Calcium: 8.1 mg/dL — ABNORMAL LOW (ref 8.9–10.3)
Chloride: 125 mmol/L — ABNORMAL HIGH (ref 98–111)
Creatinine, Ser: 0.58 mg/dL (ref 0.44–1.00)
GFR, Estimated: >60 mL/min (ref >60)
Glucose, Bld: 366 mg/dL — ABNORMAL HIGH (ref 70–99)
Potassium: 3.5 mmol/L (ref 3.5–5.1)
Sodium: 154 mmol/L — ABNORMAL HIGH (ref 135–145)

## 2020-07-07 LAB — SODIUM
Sodium: 155 mmol/L — ABNORMAL HIGH (ref 135–145)
Sodium: 158 mmol/L — ABNORMAL HIGH (ref 135–145)
Sodium: 159 mmol/L — ABNORMAL HIGH (ref 135–145)

## 2020-07-07 LAB — GLUCOSE, CAPILLARY
Glucose-Capillary: 282 mg/dL — ABNORMAL HIGH (ref 70–99)
Glucose-Capillary: 257 mg/dL — ABNORMAL HIGH (ref 70–99)
Glucose-Capillary: 273 mg/dL — ABNORMAL HIGH (ref 70–99)
Glucose-Capillary: 223 mg/dL — ABNORMAL HIGH (ref 70–99)
Glucose-Capillary: 229 mg/dL — ABNORMAL HIGH (ref 70–99)
Glucose-Capillary: 151 mg/dL — ABNORMAL HIGH (ref 70–99)

## 2020-07-07 LAB — CARDIOLIPIN ANTIBODIES, IGG, IGM, IGA
Anticardiolipin IgA: <9 APL U/mL (ref 0–11)
Anticardiolipin IgG: 11 GPL U/mL (ref 0–14)
Anticardiolipin IgM: 11 MPL U/mL (ref 0–12)

## 2020-07-07 LAB — PHOSPHORUS
Phosphorus: 2.8 mg/dL (ref 2.5–4.6)
Phosphorus: 2.6 mg/dL (ref 2.5–4.6)

## 2020-07-07 LAB — CBC
HCT: 27.7 % — ABNORMAL LOW (ref 36.0–46.0)
Hemoglobin: 7 g/dL — ABNORMAL LOW (ref 12.0–15.0)
MCH: 18.9 pg — ABNORMAL LOW (ref 26.0–34.0)
MCHC: 25.3 g/dL — ABNORMAL LOW (ref 30.0–36.0)
MCV: 74.9 fL — ABNORMAL LOW (ref 80.0–100.0)
Platelets: 146 10*3/uL — ABNORMAL LOW (ref 150–400)
RBC: 3.7 MIL/uL — ABNORMAL LOW (ref 3.87–5.11)
RDW: 22.3 % — ABNORMAL HIGH (ref 11.5–15.5)
WBC: 15.2 10*3/uL — ABNORMAL HIGH (ref 4.0–10.5)
nRBC: 0 % (ref 0.0–0.2)

## 2020-07-07 LAB — PROCALCITONIN: Procalcitonin: <0.1 ng/mL

## 2020-07-07 LAB — MAGNESIUM
Magnesium: 2.2 mg/dL (ref 1.7–2.4)
Magnesium: 2.4 mg/dL (ref 1.7–2.4)

## 2020-07-07 LAB — TSH: TSH: 0.19 u[IU]/mL — ABNORMAL LOW (ref 0.350–4.500)

## 2020-07-07 MED ORDER — INSULIN ASPART 100 UNIT/ML ~~LOC~~ SOLN
4.0000 [IU] | SUBCUTANEOUS | Status: DC
  Administered 2020-07-07 – 2020-07-08 (×6): 4 [IU] via SUBCUTANEOUS

## 2020-07-07 MED ORDER — INSULIN DETEMIR 100 UNIT/ML ~~LOC~~ SOLN
25.0000 [IU] | Freq: Two times a day (BID) | SUBCUTANEOUS | Status: DC
  Administered 2020-07-07 (×2): 25 [IU] via SUBCUTANEOUS
  Filled 2020-07-07 (×4): qty 0.25

## 2020-07-07 NOTE — Progress Notes (Signed)
eLink Physician-Brief Progress Note Patient Name: Brandi Myers DOB: 08-26-1985 MRN: [id number]   Date of Service  07/07/2020  HPI/Events of Note  Patient is moving her right hand, and has disrupted a suture line,  Bedside RN is requesting an order for a right wrist restraint  To prevent interference with her care / self harm.  eICU Interventions  Right wrist restraint ordered.        [name] Myrian Botello 07/07/2020, 12:04 AM

## 2020-07-07 NOTE — Progress Notes (Signed)
PT Cancellation Note  Patient Details Name: Brandi Myers MRN: [id number] DOB: [birth year]   Cancelled Treatment:    Reason Eval/Treat Not Completed: Patient not medically ready (Intubated. Active bedrest order).  Lillia Pauls, PT, DPT Acute Rehabilitation Services Pager [phone] Office [phone]    [name] 07/07/2020, 1:40 PM

## 2020-07-07 NOTE — Progress Notes (Signed)
Subjective: Patient reports intubated, requiring sedation  Objective: Vital signs in last 24 hours: Temp:  [98.4 F (36.9 C)-102.9 F (39.4 C)] 98.4 F (36.9 C) (03/26 0600) Pulse Rate:  [55-123] 55 (03/26 0600) Resp:  [12-34] 12 (03/26 0600) BP: (117-160)/(53-96) 117/96 (03/26 0600) SpO2:  [96 %-100 %] 100 % (03/26 0600) Arterial Line BP: (108)/(102) 108/102 (03/25 1515) FiO2 (%):  [40 %] 40 % (03/26 0314) Weight:  [72.3 kg] 72.3 kg (03/25 2200)  Intake/Output from previous day: 03/25 0701 - 03/26 0700 In: 3254 [I.V.:1975.6; NG/GT:978.3; IV Piggyback:300] Out: 5038 [UEKCM:0349; Emesis/NG output:500; Blood:10] Intake/Output this shift: Total I/O In: 1735.3 [I.V.:735.3; NG/GT:800; IV Piggyback:200] Out: 2100 [Urine:2100]  Physical Exam: Dressings CDI.  Moves right side vigorously and spontaneously.  Not moving left side.  Reportedly followed commands with right foot during the night.  Unable to open right eye due to swelling.  Left pupil reactive, sluggish.  Lab Results: Recent Labs    07/06/20 0240 07/07/20 0500  WBC 12.7* 15.2*  HGB 7.4* 7.0*  HCT 28.7* 27.7*  PLT 123* 146*   BMET Recent Labs    07/06/20 0240 07/06/20 1025 07/06/20 2300 07/07/20 0500  NA 144   < > 155* 154*  K 4.1  --   --  3.5  CL 117*  --   --  125*  CO2 19*  --   --  23  GLUCOSE 125*  --   --  366*  BUN 5*  --   --  9  CREATININE 0.59  --   --  0.58  CALCIUM 8.0*  --   --  8.1*   < > = values in this interval not displayed.    Studies/Results: CT HEAD WO CONTRAST  Result Date: 07/07/2020 CLINICAL DATA:  Stroke follow-up EXAM: CT HEAD WITHOUT CONTRAST TECHNIQUE: Contiguous axial images were obtained from the base of the skull through the vertex without intravenous contrast. COMPARISON:  Yesterday FINDINGS: Brain: Decompressive craniectomy with improved midline shift and better drainage of the left lateral ventricle. Midline shift now measures 5 mm. Right MCA territory infarct affecting  cortex and subcortical structures with patchy hazy hemorrhage that is non progressed. No new infarct is seen. Vascular: No hyperdense vessel or unexpected calcification. Skull: Unremarkable decompressive craniectomy on the right Sinuses/Orbits: Nasal septal perforation. Generalized mucosal thickening in the paranasal sinuses. IMPRESSION: Improved midline shift and normalized left lateral ventricular volume after decompressive craniectomy. No progressive infarct or hemorrhage Electronically Signed   By: Marnee Spring M.D.   On: 07/07/2020 06:18   CT HEAD WO CONTRAST  Result Date: 07/06/2020 CLINICAL DATA:  Follow-up intracranial hemorrhage EXAM: CT HEAD WITHOUT CONTRAST TECHNIQUE: Contiguous axial images were obtained from the base of the skull through the vertex without intravenous contrast. COMPARISON:  Yesterday FINDINGS: Brain: Cytotoxic edema involving cortex and basal ganglia in the right MCA territory with hazy central hemorrhage that is non progressed. Midline shift measures up to 1 cm. Unchanged dilatation of the left lateral ventricle attributed to the degree of midline shift and mass effect, with transependymal flow. Vascular: Stable Skull: Stable Sinuses/Orbits: Nasal septal perforation and patchy sinus opacification. IMPRESSION: 1. Unchanged right cerebral cytotoxic edema with central hemorrhage. Midline shift is similar at 1 cm. 2. Unchanged left lateral ventricle dilatation with evidence of transependymal flow. Electronically Signed   By: Marnee Spring M.D.   On: 07/06/2020 08:19   CT HEAD WO CONTRAST  Result Date: 07/05/2020 CLINICAL DATA:  Follow-up examination for acute stroke. EXAM:  CT HEAD WITHOUT CONTRAST TECHNIQUE: Contiguous axial images were obtained from the base of the skull through the vertex without intravenous contrast. COMPARISON:  Comparison made with prior MRI from earlier the same day. FINDINGS: Brain: Continued interval evolution of right MCA distribution infarct, stable  from prior MRI. Associated hemorrhagic transformation with hyperdense blood products throughout the area of infarction. Worsened regional mass effect and edema, with progressive effacement of the right lateral ventricle. Associated right-to-left midline shift has increased now measuring up to 9 mm. Increased dilatation of the right lateral ventricle concerning for developing ventricular trapping. Basilar cisterns remain patent. No transtentorial herniation. No other acute large vessel territory infarct or hemorrhage. No extra-axial collection. Vascular: No definite hyperdense vessel. Skull: Scalp soft tissues and calvarium within normal limits. Sinuses/Orbits: Visualized globes and orbital soft tissues within normal limits. Scattered mucosal thickening throughout the ethmoidal air cells, maxillary sinuses, and sphenoid sinuses. Small bilateral mastoid effusions noted. Other: None. IMPRESSION: 1. Continued interval evolution of right MCA distribution infarct with associated hemorrhagic transformation. Worsened regional mass effect and edema with increased right-to-left midline shift now measuring up to 9 mm. Increased dilatation of the right lateral ventricle concerning for developing ventricular trapping. 2. No other new acute intracranial abnormality. Critical Value/emergent results were called by telephone at the time of interpretation on 07/05/2020 at 11:05 pm to provider Dr. Wilford Corner, who verbally acknowledged these results. Electronically Signed   By: Rise Mu M.D.   On: 07/05/2020 23:06   MR ANGIO HEAD WO CONTRAST  Result Date: 07/05/2020 CLINICAL DATA:  Stroke.  Post thrombectomy and tPA EXAM: MRI HEAD WITHOUT CONTRAST MRA HEAD WITHOUT CONTRAST TECHNIQUE: Multiplanar, multiecho pulse sequences of the brain and surrounding structures were obtained without intravenous contrast. Angiographic images of the head were obtained using MRA technique without contrast. COMPARISON:  CT angio head and neck  07/04/2020 FINDINGS: MRI HEAD FINDINGS Brain: Acute infarct right MCA territory. This involves much of the right basal ganglia as well as the right frontal and temporal lobe. There is hemorrhagic transformation with mild-to-moderate blood in the infarct. There is local mass-effect and midline shift of 6 mm. No hydrocephalus. No evidence of chronic ischemia. No mass lesion. Vascular: Normal arterial flow voids at the base of brain. Skull and upper cervical spine: No focal skeletal abnormality. Sinuses/Orbits: Moderate mucosal edema paranasal sinuses. Bilateral mastoid effusion normal orbit Other: None MRA HEAD FINDINGS Both vertebral arteries are normal. Left PICA patent. Large right AICA patent. Basilar patent. Superior cerebellar and posterior cerebral arteries normal bilaterally. Internal carotid artery is widely patent bilaterally without stenosis or filling defect. Anterior and middle cerebral arteries widely patent bilaterally without stenosis or occlusion. No aneurysm. 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 Hemorrhagic transformation with mild to moderate hemorrhage in the infarct. There is mass-effect and 6 mm midline shift to left Negative MRA head Electronically Signed   By: Marlan Palau M.D.   On: 07/05/2020 10:01   MR BRAIN WO CONTRAST  Result Date: 07/05/2020 CLINICAL DATA:  Stroke.  Post thrombectomy and tPA EXAM: MRI HEAD WITHOUT CONTRAST MRA HEAD WITHOUT CONTRAST TECHNIQUE: Multiplanar, multiecho pulse sequences of the brain and surrounding structures were obtained without intravenous contrast. Angiographic images of the head were obtained using MRA technique without contrast. COMPARISON:  CT angio head and neck 07/04/2020 FINDINGS: MRI HEAD FINDINGS Brain: Acute infarct right MCA territory. This involves much of the right basal ganglia as well as the right frontal and temporal lobe. There  is hemorrhagic transformation with  mild-to-moderate blood in the infarct. There is local mass-effect and midline shift of 6 mm. No hydrocephalus. No evidence of chronic ischemia. No mass lesion. Vascular: Normal arterial flow voids at the base of brain. Skull and upper cervical spine: No focal skeletal abnormality. Sinuses/Orbits: Moderate mucosal edema paranasal sinuses. Bilateral mastoid effusion normal orbit Other: None MRA HEAD FINDINGS Both vertebral arteries are normal. Left PICA patent. Large right AICA patent. Basilar patent. Superior cerebellar and posterior cerebral arteries normal bilaterally. Internal carotid artery is widely patent bilaterally without stenosis or filling defect. Anterior and middle cerebral arteries widely patent bilaterally without stenosis or occlusion. No aneurysm. 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 Hemorrhagic transformation with mild to moderate hemorrhage in the infarct. There is mass-effect and 6 mm midline shift to left Negative MRA head Electronically Signed   By: Marlan Palauharles  Clark M.D.   On: 07/05/2020 10:01   DG Chest Port 1 View  Result Date: 07/06/2020 CLINICAL DATA:  Post central line placement. EXAM: PORTABLE CHEST 1 VIEW COMPARISON:  Chest radiograph July 04, 2020. FINDINGS: Interval placement of a left upper extremity PICC with tip overlying the superior cavoatrial junction. Interval advancement of the endotracheal tube with tip now overlying the distal thoracic trachea 1.6 cm from the carina. Enteric tube coursing below the diaphragm with tip obscured by collimation. The heart size and mediastinal contours are unchanged. No focal consolidation. No visible pneumothorax. The visualized skeletal structures are unremarkable. IMPRESSION: 1. Interval placement of a left upper extremity PICC with tip overlying the superior cavoatrial junction. No visible pneumothorax. 2. Interval advancement of the endotracheal tube with tip now overlying the  distal thoracic trachea. Electronically Signed   By: Maudry MayhewJeffrey  Waltz MD   On: 07/06/2020 19:12   VAS US UPPER EXTREMITY ARTERIAL DUPLEX  Result Date: 07/06/2020 UPPER EXTREMITY DUPLEX STUDY Indications: Patient complains of RUE - cool to touch.  Risk Factors: Prior CVA. Performing Technologist: Ernestene MentionJody Hill  Examination Guidelines: A complete evaluation includes B-mode imaging, spectral Doppler, color Doppler, and power Doppler as needed of all accessible portions of each vessel. Bilateral testing is considered an integral part of a complete examination. Limited examinations for reoccurring indications may be performed as noted.  Right Doppler Findings: +---------------+----------+----------+--------+--------+ Site           PSV (cm/s)Waveform  StenosisComments +---------------+----------+----------+--------+--------+ Subclavian Prox81        triphasic                  +---------------+----------+----------+--------+--------+ Subclavian Dist          absent                     +---------------+----------+----------+--------+--------+ Axillary       63        triphasic                  +---------------+----------+----------+--------+--------+ Brachial Prox  51        biphasic                   +---------------+----------+----------+--------+--------+ Brachial Mid   33        biphasic                   +---------------+----------+----------+--------+--------+ Brachial Dist  14        monophasicoccluded         +---------------+----------+----------+--------+--------+ Radial Prox    0  absent    occluded         +---------------+----------+----------+--------+--------+ Radial Mid     0         absent    occluded         +---------------+----------+----------+--------+--------+ Radial Dist    0         absent    occluded         +---------------+----------+----------+--------+--------+ Ulnar Prox     0         absent    occluded          +---------------+----------+----------+--------+--------+ Ulnar Mid      0         absent                     +---------------+----------+----------+--------+--------+ Ulnar Dist     0         absent                     +---------------+----------+----------+--------+--------+ Incidental finding: RUE cephalic SVT.   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 Vascular consult recommended. Electronically signed by Heath Lark on 07/06/2020 at 7:04:01 PM.    Final    ECHOCARDIOGRAM COMPLETE  Result Date: 07/05/2020    ECHOCARDIOGRAM REPORT   Patient Name:   Brandi Myers Date of Exam: 07/05/2020 Medical Rec #:  [id number]       Height:       65.0 in Accession #:    [id number]      Weight:       159.2 lb Date of Birth:  1985-05-17      BSA:          1.795 m Patient Age:    34 years        BP:           130/91 mmHg Patient Gender: F               HR:           115 bpm. Exam Location:  Inpatient Procedure: 2D Echo, Cardiac Doppler and Color Doppler Indications:    Stroke 434.91  History:        Patient has no prior history of Echocardiogram examinations.                 Risk Factors:Hypertension and Diabetes.  Sonographer:    Elmarie Shiley Dance Referring Phys: [id number] ERIC LINDZEN IMPRESSIONS  1. Left ventricular ejection fraction, by estimation, is 60 to 65%. The left ventricle has normal function. The left ventricle has no regional wall motion abnormalities. Left ventricular diastolic parameters are consistent with Grade I diastolic dysfunction (impaired relaxation).  2. Right ventricular systolic function is normal. The right ventricular size is normal.  3. The mitral valve is normal in structure. Trivial mitral valve regurgitation. No evidence of mitral stenosis.  4. The aortic valve is normal in structure. Aortic valve regurgitation is not visualized. No aortic stenosis is present.  5. The  inferior vena cava is normal in size with greater than 50% respiratory variability, suggesting right atrial pressure of 3 mmHg. Conclusion(s)/Recommendation(s): No intracardiac source of embolism detected on this transthoracic study. A transesophageal echocardiogram is recommended to exclude cardiac source of embolism if clinically indicated. FINDINGS  Left Ventricle: Left ventricular ejection fraction, by estimation, is 60 to 65%. The left  ventricle has normal function. The left ventricle has no regional wall motion abnormalities. The left ventricular internal cavity size was normal in size. There is  no left ventricular hypertrophy. Left ventricular diastolic parameters are consistent with Grade I diastolic dysfunction (impaired relaxation). Normal left ventricular filling pressure. Right Ventricle: The right ventricular size is normal. No increase in right ventricular wall thickness. Right ventricular systolic function is normal. Left Atrium: Left atrial size was normal in size. Right Atrium: Right atrial size was normal in size. Pericardium: There is no evidence of pericardial effusion. Mitral Valve: The mitral valve is normal in structure. Trivial mitral valve regurgitation. No evidence of mitral valve stenosis. Tricuspid Valve: The tricuspid valve is normal in structure. Tricuspid valve regurgitation is trivial. No evidence of tricuspid stenosis. Aortic Valve: The aortic valve is normal in structure. Aortic valve regurgitation is not visualized. No aortic stenosis is present. Pulmonic Valve: The pulmonic valve was normal in structure. Pulmonic valve regurgitation is not visualized. No evidence of pulmonic stenosis. Aorta: The aortic root is normal in size and structure. Venous: The inferior vena cava is normal in size with greater than 50% respiratory variability, suggesting right atrial pressure of 3 mmHg. IAS/Shunts: No atrial level shunt detected by color flow Doppler.  LEFT VENTRICLE PLAX 2D LVIDd:          4.20 cm  Diastology LVIDs:         3.20 cm  LV e' medial:    10.30 cm/s LV PW:         0.90 cm  LV E/e' medial:  8.5 LV IVS:        0.80 cm  LV e' lateral:   15.00 cm/s LVOT diam:     2.10 cm  LV E/e' lateral: 5.9 LV SV:         55 LV SV Index:   31 LVOT Area:     3.46 cm  RIGHT VENTRICLE             IVC RV Basal diam:  2.40 cm     IVC diam: 1.40 cm RV S prime:     13.40 cm/s TAPSE (M-mode): 2.2 cm LEFT ATRIUM             Index       RIGHT ATRIUM          Index LA diam:        2.70 cm 1.50 cm/m  RA Area:     8.48 cm LA Vol (A2C):   27.5 ml 15.32 ml/m RA Volume:   17.50 ml 9.75 ml/m LA Vol (A4C):   18.7 ml 10.42 ml/m LA Biplane Vol: 23.2 ml 12.92 ml/m  AORTIC VALVE LVOT Vmax:   99.20 cm/s LVOT Vmean:  66.100 cm/s LVOT VTI:    0.160 m  AORTA Ao Root diam: 3.20 cm Ao Asc diam:  2.60 cm MITRAL VALVE MV Area (PHT): 2.97 cm    SHUNTS MV Decel Time: 256 msec    Systemic VTI:  0.16 m MV E velocity: 88.05 cm/s  Systemic Diam: 2.10 cm Tobias Alexander MD Electronically signed by Tobias Alexander MD Signature Date/Time: 07/05/2020/12:43:53 PM    Final    VAS [place] LOWER EXTREMITY VENOUS (DVT)  Result Date: 07/05/2020  Lower Venous DVT Study Indications: Embolic stroke.  Comparison Study: No prior Performing Technologist: Marilynne Halsted RDMS, RVT  Examination Guidelines: A complete evaluation includes B-mode imaging, spectral Doppler, color Doppler, and power Doppler as needed of all accessible portions of  each vessel. Bilateral testing is considered an integral part of a complete examination. Limited examinations for reoccurring indications may be performed as noted. The reflux portion of the exam is performed with the patient in reverse Trendelenburg.  +---------+---------------+---------+-----------+----------+--------------+ RIGHT    CompressibilityPhasicitySpontaneityPropertiesThrombus Aging +---------+---------------+---------+-----------+----------+--------------+ CFV      Full           Yes      Yes                                  +---------+---------------+---------+-----------+----------+--------------+ SFJ      Full                                                        +---------+---------------+---------+-----------+----------+--------------+ FV Prox  Full                                                        +---------+---------------+---------+-----------+----------+--------------+ FV Mid   Full                                                        +---------+---------------+---------+-----------+----------+--------------+ FV DistalFull                                                        +---------+---------------+---------+-----------+----------+--------------+ PFV      Full                                                        +---------+---------------+---------+-----------+----------+--------------+ POP      Full           Yes      Yes                                 +---------+---------------+---------+-----------+----------+--------------+ PTV      Full                                                        +---------+---------------+---------+-----------+----------+--------------+ PERO     Full                                                        +---------+---------------+---------+-----------+----------+--------------+   +---------+---------------+---------+-----------+----------+--------------+ LEFT     CompressibilityPhasicitySpontaneityPropertiesThrombus  Aging +---------+---------------+---------+-----------+----------+--------------+ CFV      Full           Yes      Yes                                 +---------+---------------+---------+-----------+----------+--------------+ SFJ      Full                                                        +---------+---------------+---------+-----------+----------+--------------+ FV Prox  Full                                                         +---------+---------------+---------+-----------+----------+--------------+ FV Mid   Full                                                        +---------+---------------+---------+-----------+----------+--------------+ FV DistalFull                                                        +---------+---------------+---------+-----------+----------+--------------+ PFV      Full                                                        +---------+---------------+---------+-----------+----------+--------------+ POP      Full           Yes      Yes                                 +---------+---------------+---------+-----------+----------+--------------+ PTV      Full                                                        +---------+---------------+---------+-----------+----------+--------------+ PERO     Full                                                        +---------+---------------+---------+-----------+----------+--------------+     Summary: BILATERAL: - No evidence of deep vein thrombosis seen in the lower extremities, bilaterally. -No evidence of popliteal cyst, bilaterally.   *See table(s) above for measurements and observations. Electronically signed by Sherald Hess MD on 07/05/2020 at 6:21:17 PM.    Final    [place]  EKG SITE RITE  Result Date: 07/06/2020 If [facility] image not attached, placement could not be confirmed due to current cardiac rhythm.   Assessment/Plan: Patient's neurologic exam is improved after decompressive craniectomy.  Care per Neurology/Stroke Service.  We will follow patient.  No new recs from my standpoint.    LOS: 3 days    Dorian Heckle, MD 07/07/2020, 6:24 AM

## 2020-07-07 NOTE — Progress Notes (Addendum)
STROKE TEAM PROGRESS NOTE   INTERVAL HISTORY Underwent hemicraniotomy yesterday and tolerated the procedure well. Last night she began to have a fever. Her WBC has been elevated since admission but has increased since yesterday 12.7 to 15.2. Chest X-ray was negative. Blood cultures have been drawn. She has been started on [facility] protocol.  She is intubated. Does not follow commands. Is moving Right arm and leg. Requires soft restraints as she has attempted to pull out lines and tubes.  Vascular Surgery, Neurosurgery, and CCM are following.  Vitals:   07/07/20 0426 07/07/20 0500 07/07/20 0529 07/07/20 0600  BP:  (!) 150/55  (!) 117/96  Pulse: (!) 109 (!) 57 67 (!) 55  Resp: 18 15 16 12   Temp:  99 F (37.2 C) 98.8 F (37.1 C) 98.4 F (36.9 C)  TempSrc:  Rectal Rectal Rectal  SpO2: 100% 100% 100% 100%  Weight:      Height:       CBC:  Recent Labs  Lab 07/04/20 1247 07/04/20 1844 07/05/20 0541 07/06/20 0240 07/07/20 0500  WBC 17.8*  --  14.2* 12.7* 15.2*  NEUTROABS 12.7*  --  12.5*  --   --   HGB 9.5*   < > 8.6* 7.4* 7.0*  HCT 35.0*   < > 32.0* 28.7* 27.7*  MCV 67.2*  --  69.3* 71.4* 74.9*  PLT 235  --  160 123* 146*   < > = values in this interval not displayed.   Basic Metabolic Panel:  Recent Labs  Lab 07/06/20 0240 07/06/20 1025 07/06/20 2300 07/07/20 0500  NA 144   < > 155* 154*  K 4.1  --   --  3.5  CL 117*  --   --  125*  CO2 19*  --   --  23  GLUCOSE 125*  --   --  366*  BUN 5*  --   --  9  CREATININE 0.59  --   --  0.58  CALCIUM 8.0*  --   --  8.1*  MG 2.1  --   --  2.2  PHOS 2.1*  --   --  2.8   < > = values in this interval not displayed.   Lipid Panel:  Recent Labs  Lab 07/05/20 0541  CHOL 154  TRIG 79  86  HDL 39*  CHOLHDL 3.9  VLDL 16  LDLCALC 99   HgbA1c:  Recent Labs  Lab 07/04/20 1814  HGBA1C 10.6*   Urine Drug Screen:  Recent Labs  Lab 07/05/20 0010  LABOPIA NONE DETECTED  COCAINSCRNUR POSITIVE*  LABBENZ NONE DETECTED   AMPHETMU NONE DETECTED  THCU POSITIVE*  LABBARB NONE DETECTED    Alcohol Level  Recent Labs  Lab 07/04/20 1247  ETH <10    IMAGING past 24 hours CT HEAD WO CONTRAST  Result Date: 07/07/2020 CLINICAL DATA:  Stroke follow-up EXAM: CT HEAD WITHOUT CONTRAST TECHNIQUE: Contiguous axial images were obtained from the base of the skull through the vertex without intravenous contrast. COMPARISON:  Yesterday FINDINGS: Brain: Decompressive craniectomy with improved midline shift and better drainage of the left lateral ventricle. Midline shift now measures 5 mm. Right MCA territory infarct affecting cortex and subcortical structures with patchy hazy hemorrhage that is non progressed. No new infarct is seen. Vascular: No hyperdense vessel or unexpected calcification. Skull: Unremarkable decompressive craniectomy on the right Sinuses/Orbits: Nasal septal perforation. Generalized mucosal thickening in the paranasal sinuses. IMPRESSION: Improved midline shift and normalized left lateral ventricular  volume after decompressive craniectomy. No progressive infarct or hemorrhage Electronically Signed   By: Marnee Spring M.D.   On: 07/07/2020 06:18   DG Chest Port 1 View  Result Date: 07/06/2020 CLINICAL DATA:  Post central line placement. EXAM: PORTABLE CHEST 1 VIEW COMPARISON:  Chest radiograph July 04, 2020. FINDINGS: Interval placement of a left upper extremity PICC with tip overlying the superior cavoatrial junction. Interval advancement of the endotracheal tube with tip now overlying the distal thoracic trachea 1.6 cm from the carina. Enteric tube coursing below the diaphragm with tip obscured by collimation. The heart size and mediastinal contours are unchanged. No focal consolidation. No visible pneumothorax. The visualized skeletal structures are unremarkable. IMPRESSION: 1. Interval placement of a left upper extremity PICC with tip overlying the superior cavoatrial junction. No visible pneumothorax. 2.  Interval advancement of the endotracheal tube with tip now overlying the distal thoracic trachea. Electronically Signed   By: Maudry Mayhew MD   On: 07/06/2020 19:12   [place] EKG SITE RITE  Result Date: 07/06/2020 If Site Rite image not attached, placement could not be confirmed due to current cardiac rhythm.   PHYSICAL EXAM Blood pressure (!) 117/96, pulse (!) 55, temperature 98.4 F (36.9 C), temperature source Rectal, resp. rate 12, height 5\' 5"  (1.651 m), weight 72.3 kg, SpO2 100 %.  General: intubated, caucasian female, no apparent distress  Lungs: Symmetrical Chest rise, no labored breathing  Cardio: Regular Rate and Rhythm  Abdomen: Soft, non-tender  Neuro: intubated, not able to follow any commands. Grimaces to noxious stimuli on the RUE and RLE, does not respond to noxious stimuli on the LUE and LLE.  Motor: Moves right arm and leg. No movement to noxious stimuli on the LUE or LLE.    ASSESSMENT/PLAN Brandi Myers is a 35 y.o. female with history of uncontrolled diabetesandhypertensionpresenting with left sided weakness and slurred speech.Per family there is a complicated social situation at this time. She lives intermittently in an apartment by herself but was kicked out and they went to pick her up at 5:30 AM. At that point she seemed "off"and generally quiet but did not seem to have any difficulty with left-sided weakness.Family attributed this to psychosocial issues and potential substance use noting that her sister passed away from an overdose in October. Later in the morning she complained of diffuse body pain and was administered gabapentin and a muscle relaxer.At 11:30 AM she stood up and walkedto the showerand at1215 she was found on the bottom of the tub with slurred speech and difficulty moving the left side. She was not preactivated by EMS, but on arrival in the ED clearly appear to have a doorway examination consistent with a right MCA territory  infarct.CTA revealed right ICA occlusion as well as a right common carotid bifurcation thrombus extending into the external carotid (felt to be the source of the occlusion). CT perfusion imaging demonstrated possible core infarct of 51 mL (though notably this was obtained within 2 hours of symptom onset and therefore may reflect an overestimate of the true core). Mismatch ratio was 2 with a penumbra of 63 mL. Case was discussed with Dr. [id number] of neuro interventional radiology and Dr. Corliss Skains of [facility] and the patient was accepted for transfer to [facility] for emergent intervention. She was also started on TPA after consenting the patient's emergency contact with full discussion of risks of bleeding and importance of accurately determining the patient's last known well.Brandi Myers confirm the last known well  as described above and agreed that the patient would want TPA given the risk/benefit.She confirmed that the patient had no recent procedures or other contraindications to TPA.Regarding her baseline, she is functional at baseline and family believes she is smoking at least1pack a day currently. She is on Metformin for her diabetes, does not take oral contraceptive pills, has had no recent surgeries or procedures, has not had any complaints of bleeding and family believes that she would have informed them of any significant bleeding.  She is being followed by Vascular surgery and Neurosurgery, will await further recommendations. Her body temperature is currently being controlled by the [employer] protocol. Her chest X-ray is negative for signs of pneumonia and blood cultures have been drawn and are awaiting results. Will continue to monitor. She has reached goal Na so 3% NaCl will be weaned off.  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   CTA head and neck showed right ICA terminal and proximal MCA occlusion, right CCA nonocclusive thrombus.   CT perfusion showed large penumbra.   Status post thrombectomy with right terminal ICA, right PCA and right ACA TICI2breperfusion however does have distal micro emboli.   MRI showed large right MCA infarct with 54mm MLS and hemorrhagic transformation.   MRA showed patent intracranial vessels.   CT Head WO Contrast 3/26  620 - Improved midline shift and normalized left lateral ventricular volume after decompressive craniectomy. No progressive infarct or hemorrhage.  2D Echo- EF: 60-65%. No wall motion abnormality.  Consider TEE once stable  DVT US -No evidence of DVT bilaterally  LDL99  HgbA1c10.6  VTE prophylaxis -SCD's  No antithromboticprior to admission, now on No antithromboticdue to Hemorrhagic conversion  Therapy recommendations:Pending  Disposition:Pending  Cerebral edema  CT Head repeat 3/24 - Continued interval evolution of right MCA distribution infarct with associated hemorrhagic transformation. Worsened regional mass effect and edema with increased right-to-left midline shift now measuring up to 9 mm. Increased dilatation of the right lateral ventricle concerning for developing ventricular trapping. No other new acute intracranial abnormality.  CT repeat 3/25 unchanged right cerebral cytotoxic edema with central hemorrhage. Midline shift is similar at 1 cm. Unchanged left lateral ventricle dilatation with evidence of transependymal flow.  S/p decompressive hemicraniectomy with Dr. Venetia Maxon  CT Head WO Contrast 3/26  620 - Improved midline shift and normalized left lateral ventricular volume after decompressive craniectomy. No progressive infarct or hemorrhage.  On keppra  3% saline off  S/p PICC line  Na goal 150-155  Na 137->141->147->150>158  Fever, central origin?  Tmax  101.7->102.5  Consider TEE once stable to rule out endocarditis  UA neg  CXR - Interval placement of a left upper extremity PICC with tip overlying the superior cavoatrial junction. No visible pneumothorax. Interval advancement of the endotracheal tube with tip now overlying the distal thoracic trachea.  B Cx pending  May need to consider arctic sun if persistent high grade fever  On Ancef  Respiratory failure  Intubated on vent  CCM on board  CXR - Interval placement of a left upper extremity PICC with tip overlying the superior cavoatrial junction. No visible pneumothorax. Interval advancement of the endotracheal tube with tip now overlying the distal thoracic trachea.  Hyperlipidemia  Home meds:none  LDL 99, goal < 70  On lipitor 40 now  Continue statin at  discharge  Diabetes type II Uncontrolled  Home meds:Metformin, Glargine, Aspart  HgbA1c 10.6, goal < 7.0  CBGs  SSI  Cocaine abuse  UDS positive for cocaine  Cessation education will be provided  Tobacco abuse  Current smoker  Smoking cessation counseling will be provided  Other Stroke Risk Factors  Substance abuse - UDS: THC POSITIVEWill advise patientto stop using due to stroke risk.  Other Active Problems  Sick thyroid? - TSH: 0.307 (Low) Free T4:1.27 (high)   Blood Cultures draw 3/25  Hospital day # 3  Arna SnipeAlex Pashayan MD Resident    Moves the right side vigorously to painful stimuli.  Brainstem reflexes are intact but still with left hemiplegia.  This patient is critically ill due to stroke post procedure and at significant risk of neurological worsening, death form severe anemia, bleeding, recurrent stroke, intracranial stenosis. This patient's care requires constant monitoring of vital signs, hemodynamics, respiratory and cardiac monitoring, review of multiple databases, neurological assessment, other specialists and medical decision making of high complexity. I spent  35 minutes of neurocritical care time in the care of this patient.    Dr. Gerilyn Pilgrimoonquah -- The patient was seen and examined by me; notes, chart and tests reviewed and discussed with the practice provider and other providers.   To contact Stroke Continuity provider, please refer to [email or web address]. After hours, contact General Neurology

## 2020-07-07 NOTE — Progress Notes (Signed)
Dr. Wilford Corner notified that patient's last sodium was 155. Was told to change rate of hypertonic saline to 10 mL/hour. Will change rate, will continue to monitor.

## 2020-07-07 NOTE — Progress Notes (Signed)
NAME:  Brandi Myers, MRN:  [id number], DOB:  12-05-1985, LOS: 3 ADMISSION DATE:  07/04/2020, CONSULTATION DATE:  07/04/2020 REFERRING MD:  Dr. Corliss Skains, CHIEF COMPLAINT:  Vent management    History of Present Illness:  Brandi Myers is a 35 y.o. with PMH significant for HTN and type 2 diabetes who presented as a code stroke due to left sided weakness and slurred speech.  On arrival patient was also seen altered/combative requiring intubation to obtain CT imaging to rule out stroke.  Head CT demonstrated right ICA occlusion as well as right common carotid bifurcation thrombus resulting in acute stroke.  Patient received TPA in ED at [facility] after which she was transferred to [facility] for further neurological work-up and interventions including thrombectomy.  On arrival to [facility] patient underwent bilateral common carotid arteriograms with revascularization of occluded terminal ICA and right MCA and right ACA.  Post intervention patient will remain intubated and was transferred to neuro ICU. Critical care consulted for ventilator management  Pertinent  Medical History   Uncontrolled diabetes and hypertension  Significant Hospital Events: Including procedures, antibiotic start and stop dates in addition to other pertinent events   .  3/23 > admitted as a code stroke and found to have occluded right ICA resulting in stroke.  Underwent IR revascularization and remained intubated post procedure . 3/24 R brachial clot requiring thrombectomy, hemorrhagic transformation on MRI and worsening midline shift on imaging . 3/25 NSGY decompressive craniectomy, TF started . 3/26 fevers post op, holding abx, SBT/SAT  Interim History / Subjective:  No events, improved midline shift and neuro exam after decompression.  Objective   Blood pressure (!) 117/96, pulse (!) 55, temperature 98.4 F (36.9 C), temperature source Rectal, resp. rate 12, height 5\' 5"  (1.651 m), weight 72.3 kg, SpO2 100 %.     Vent Mode: PSV FiO2 (%):  [40 %] 40 % Set Rate:  [16 bmp] 16 bmp Vt Set:  [450 mL] 450 mL PEEP:  [5 cmH20] 5 cmH20 Pressure Support:  [0 cmH20] 0 cmH20 Plateau Pressure:  [12 cmH20-18 cmH20] 18 cmH20   Intake/Output Summary (Last 24 hours) at 07/07/2020 07/09/2020 Last data filed at 07/07/2020 07/09/2020 Gross per 24 hour  Intake 3253.97 ml  Output 4965 ml  Net -1711.03 ml   Filed Weights   07/04/20 1730 07/06/20 2200  Weight: 72.2 kg 72.3 kg    Examination: Constitutional: young woman on vent  Head: R eye swollen shut, scalp incision site looks CDI Eyes: pupils equal, not tracking Ears, nose, mouth, and throat: ETT in place Cardiovascular: RRR, ext warm Respiratory: more rhonci and secretions today Gastrointestinal: soft, +BS Skin: No rashes, normal turgor; R arm incision site CDI Neurologic: she does move R to command with enough stimulation, dense hemiplegia on L Psychiatric: deferred  Hgb remains low, probably will need transfusion in AM WBC mildly up postop Sugars up with TF Sodium now in 150s  Resolved Hospital Problem list     Assessment & Plan:  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 (edema/hemorrhage)- s/p decompressive craniectomy 3/25 Acute respiratory insufficiency secondary to above- on vent Uncontrolled type 2 diabetes- needs tighter control R limb critical limb ischemia- s/p  Low TSH, mildly elevated t4- OP repeat labs once drugs out of system 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, hx polysubstance abuse Tobacco abuse Fevers post op- suspect central  - Wean sedation and SBT today; however suspect she will need a few more days for mental status  to clear more from degree of swelling - Postop incision site management per NSGY - Goal SBP < 160 - GDMT, hypertonic saline, and stroke workup per neurology - Increase levemir and add TF coverage aspart - f/u culture data, Pct, hold on abx for now, monitor fever curve and usual tiered fever prevention  protocol  Best practice (evaluated daily)  Diet: TF Pain/Anxiety/Delirium protocol (if indicated): PRNs for now VAP protocol (if indicated): Yes DVT prophylaxis: SCD GI prophylaxis: PPI Glucose control:  Basal + SSI Central venous access:  N/A Arterial line:  N/A Foley:  yes Mobility:  bed rest  PT consulted: Yes Last date of multidisciplinary goals of care discussion N/A Code Status:  full code Disposition: ICU   Patient critically ill due to respiratory failure Interventions to address this today ventilator weaning Risk of deterioration without these interventions is high  I personally spent 35 minutes providing critical care not including any separately billable procedures  Myrla Halsted MD [facility]  Prefer epic messenger for cross cover needs If after hours, please call E-link

## 2020-07-07 NOTE — Progress Notes (Signed)
Dr. Lenell Antu of Vascular surgery notified that approximately 1 inch of patient's surgical incision on right upper extremity has opened up. No bleeding. Patient's pulses remain dopplerable, with capillary refill of less than 3 seconds. Hand warm. Color remains the same, slightly pale. This RN was told to apply a dry 4x4 gauze and wrap with kerlix. Dressing applied. Will continue to monitor.

## 2020-07-07 NOTE — Progress Notes (Signed)
[facility] [place] PROGRESS NOTE  ASSESSMENT / PLAN: Brandi Myers is a 35 y.o. female status post right brachial embolectomy.  Unfortunately has superficial dehiscence of her incision, likely from attempted self extubation.  Recommend local wound care for now.  Otherwise per primary.  SUBJECTIVE: Intubated.  Sedated.  Purposeful with right upper extremity.  OBJECTIVE: BP (!) 160/64   Pulse (!) 56   Temp 99.1 F (37.3 C)   Resp 11   Ht 5\' 5"  (1.651 m)   Wt 72.3 kg   SpO2 99%   BMI 26.52 kg/m   Intake/Output Summary (Last 24 hours) at 07/07/2020 1139 Last data filed at 07/07/2020 0800 Gross per 24 hour  Intake 3032.15 ml  Output 4315 ml  Net -1282.85 ml    Superficial dehiscence of right brachial incision Doppler flow in radial and ulnar arteries  CBC Latest Ref Rng & Units 07/07/2020 07/06/2020 07/05/2020  WBC 4.0 - 10.5 K/uL 15.2(H) 12.7(H) 14.2(H)  Hemoglobin 12.0 - 15.0 g/dL 7.0(L) 7.4(L) 8.6(L)  Hematocrit 36.0 - 46.0 % 27.7(L) 28.7(L) 32.0(L)  Platelets 150 - 400 K/uL 146(L) 123(L) 160     CMP Latest Ref Rng & Units 07/07/2020 07/06/2020 07/06/2020  Glucose 70 - 99 mg/dL 07/08/2020) - -  BUN 6 - 20 mg/dL 9 - -  Creatinine 465(K - 1.00 mg/dL 8.12 - -  Sodium 7.51 - 145 mmol/L 154(H) 155(H) 150(H)  Potassium 3.5 - 5.1 mmol/L 3.5 - -  Chloride 98 - 111 mmol/L 125(H) - -  CO2 22 - 32 mmol/L 23 - -  Calcium 8.9 - 10.3 mg/dL 8.1(L) - -  Total Protein 6.5 - 8.1 g/dL - - -  Total Bilirubin 0.3 - 1.2 mg/dL - - -  Alkaline Phos 38 - 126 U/L - - -  AST 15 - 41 U/L - - -  ALT 0 - 44 U/L - - -    Estimated Creatinine Clearance: 97.8 mL/min (by C-G formula based on SCr of 0.58 mg/dL).  700. Rande Brunt, MD [facility] Phone Number: [phone] 07/07/2020 11:39 AM

## 2020-07-08 DIAGNOSIS — I6601 Occlusion and stenosis of right middle cerebral artery: Secondary | ICD-10-CM | POA: Diagnosis not present

## 2020-07-08 DIAGNOSIS — I639 Cerebral infarction, unspecified: Secondary | ICD-10-CM | POA: Diagnosis not present

## 2020-07-08 LAB — SODIUM
Sodium: 156 mmol/L — ABNORMAL HIGH (ref 135–145)
Sodium: 160 mmol/L — ABNORMAL HIGH (ref 135–145)
Sodium: 158 mmol/L — ABNORMAL HIGH (ref 135–145)

## 2020-07-08 LAB — PREPARE RBC (CROSSMATCH)

## 2020-07-08 LAB — BASIC METABOLIC PANEL
Anion gap: 6 (ref 5–15)
BUN: 9 mg/dL (ref 6–20)
CO2: 28 mmol/L (ref 22–32)
Calcium: 8.8 mg/dL — ABNORMAL LOW (ref 8.9–10.3)
Chloride: 127 mmol/L — ABNORMAL HIGH (ref 98–111)
Creatinine, Ser: 0.53 mg/dL (ref 0.44–1.00)
GFR, Estimated: >60 mL/min (ref >60)
Glucose, Bld: 267 mg/dL — ABNORMAL HIGH (ref 70–99)
Potassium: 3.4 mmol/L — ABNORMAL LOW (ref 3.5–5.1)
Sodium: 161 mmol/L (ref 135–145)

## 2020-07-08 LAB — ABO/RH: ABO/RH(D): O POS

## 2020-07-08 LAB — CBC
HCT: 26.4 % — ABNORMAL LOW (ref 36.0–46.0)
Hemoglobin: 6.6 g/dL — CL (ref 12.0–15.0)
MCH: 19.1 pg — ABNORMAL LOW (ref 26.0–34.0)
MCHC: 25 g/dL — ABNORMAL LOW (ref 30.0–36.0)
MCV: 76.5 fL — ABNORMAL LOW (ref 80.0–100.0)
Platelets: 186 10*3/uL (ref 150–400)
RBC: 3.45 MIL/uL — ABNORMAL LOW (ref 3.87–5.11)
RDW: 22.5 % — ABNORMAL HIGH (ref 11.5–15.5)
WBC: 13.6 10*3/uL — ABNORMAL HIGH (ref 4.0–10.5)
nRBC: 0 % (ref 0.0–0.2)

## 2020-07-08 LAB — GLUCOSE, CAPILLARY
Glucose-Capillary: 101 mg/dL — ABNORMAL HIGH (ref 70–99)
Glucose-Capillary: 103 mg/dL — ABNORMAL HIGH (ref 70–99)
Glucose-Capillary: 121 mg/dL — ABNORMAL HIGH (ref 70–99)
Glucose-Capillary: 171 mg/dL — ABNORMAL HIGH (ref 70–99)
Glucose-Capillary: 183 mg/dL — ABNORMAL HIGH (ref 70–99)
Glucose-Capillary: 214 mg/dL — ABNORMAL HIGH (ref 70–99)

## 2020-07-08 LAB — HEMOGLOBIN AND HEMATOCRIT, BLOOD
HCT: 31.4 % — ABNORMAL LOW (ref 36.0–46.0)
Hemoglobin: 7.9 g/dL — ABNORMAL LOW (ref 12.0–15.0)

## 2020-07-08 LAB — MAGNESIUM
Magnesium: 2.1 mg/dL (ref 1.7–2.4)
Magnesium: 1.9 mg/dL (ref 1.7–2.4)

## 2020-07-08 LAB — TRIGLYCERIDES: Triglycerides: 82 mg/dL (ref <150)

## 2020-07-08 LAB — PHOSPHORUS
Phosphorus: 3 mg/dL (ref 2.5–4.6)
Phosphorus: 3.3 mg/dL (ref 2.5–4.6)

## 2020-07-08 MED ORDER — SODIUM CHLORIDE 0.9% IV SOLUTION: Freq: Once | INTRAVENOUS | Status: DC

## 2020-07-08 MED ORDER — INSULIN ASPART 100 UNIT/ML ~~LOC~~ SOLN
6.0000 [IU] | SUBCUTANEOUS | Status: DC
  Administered 2020-07-08 – 2020-08-03 (×124): 6 [IU] via SUBCUTANEOUS

## 2020-07-08 MED ORDER — SODIUM CHLORIDE 0.9 % IV SOLN
2.0000 g | INTRAVENOUS | Status: AC
  Administered 2020-07-08 – 2020-07-14 (×7): 2 g via INTRAVENOUS
  Filled 2020-07-08 (×4): qty 2
  Filled 2020-07-08: qty 20
  Filled 2020-07-08 (×2): qty 2

## 2020-07-08 MED ORDER — VANCOMYCIN HCL 10 G IV SOLR
1750.0000 mg | Freq: Once | INTRAVENOUS | Status: AC
  Administered 2020-07-08: 1750 mg via INTRAVENOUS
  Filled 2020-07-08: qty 1750

## 2020-07-08 MED ORDER — FREE WATER
300.0000 mL | Freq: Four times a day (QID) | Status: DC
  Administered 2020-07-08 – 2020-07-09 (×5): 300 mL

## 2020-07-08 MED ORDER — INSULIN DETEMIR 100 UNIT/ML ~~LOC~~ SOLN
30.0000 [IU] | Freq: Two times a day (BID) | SUBCUTANEOUS | Status: DC
  Administered 2020-07-08 – 2020-07-13 (×12): 30 [IU] via SUBCUTANEOUS
  Filled 2020-07-08 (×14): qty 0.3

## 2020-07-08 MED ORDER — POTASSIUM CHLORIDE 20 MEQ PO PACK
40.0000 meq | PACK | Freq: Once | ORAL | Status: AC
  Administered 2020-07-08: 40 meq
  Filled 2020-07-08: qty 2

## 2020-07-08 NOTE — Progress Notes (Signed)
Date and time results received: 07/08/20 0615  Test: Hgb Critical Value: 6.6  Name of Provider Notified: Elink/CCM  Orders Received? Or Actions Taken?: Awaiting New Orders will pass on to oncoming nurse.

## 2020-07-08 NOTE — Progress Notes (Signed)
STROKE TEAM PROGRESS NOTE   INTERVAL HISTORY Significant drop in hemoglobin down to 6.6.  She is currently being transfused.  Neurologically unchanged without significant improvement or worsening.  She is intubated. Does not follow commands. Is moving Right arm and leg. Requires soft restraints as she has attempted to pull out lines and tubes.  Vascular Surgery, Neurosurgery, and CCM are following.  Vitals:   07/08/20 0500 07/08/20 0600 07/08/20 0700 07/08/20 0800  BP: 134/66 131/70 (!) 149/67 (!) 152/75  Pulse: 81 88 (!) 103 (!) 109  Resp: 16 16 17 10   Temp: 98.4 F (36.9 C) 98.6 F (37 C) 98.8 F (37.1 C) 99 F (37.2 C)  TempSrc: Rectal Rectal Rectal   SpO2: 100% 100% 100% 100%  Weight:      Height:       CBC:  Recent Labs  Lab 07/04/20 1247 07/04/20 1844 07/05/20 0541 07/06/20 0240 07/07/20 0500 07/08/20 0528  WBC 17.8*  --  14.2*   < > 15.2* 13.6*  NEUTROABS 12.7*  --  12.5*  --   --   --   HGB 9.5*   < > 8.6*   < > 7.0* 6.6*  HCT 35.0*   < > 32.0*   < > 27.7* 26.4*  MCV 67.2*  --  69.3*   < > 74.9* 76.5*  PLT 235  --  160   < > 146* 186   < > = values in this interval not displayed.   Basic Metabolic Panel:  Recent Labs  Lab 07/07/20 0500 07/07/20 1115 07/07/20 1630 07/07/20 2300 07/08/20 0528  NA 154*   < > 159* 156* 161*  K 3.5  --   --   --  3.4*  CL 125*  --   --   --  127*  CO2 23  --   --   --  28  GLUCOSE 366*  --   --   --  267*  BUN 9  --   --   --  9  CREATININE 0.58  --   --   --  0.53  CALCIUM 8.1*  --   --   --  8.8*  MG 2.2  --  2.4  --  2.1  PHOS 2.8  --  2.6  --  3.0   < > = values in this interval not displayed.   Lipid Panel:  Recent Labs  Lab 07/05/20 0541 07/08/20 0500  CHOL 154  --   TRIG 79  86 82  HDL 39*  --   CHOLHDL 3.9  --   VLDL 16  --   LDLCALC 99  --    HgbA1c:  Recent Labs  Lab 07/04/20 1814  HGBA1C 10.6*   Urine Drug Screen:  Recent Labs  Lab 07/05/20 0010  LABOPIA NONE DETECTED  COCAINSCRNUR  POSITIVE*  LABBENZ NONE DETECTED  AMPHETMU NONE DETECTED  THCU POSITIVE*  LABBARB NONE DETECTED    Alcohol Level  Recent Labs  Lab 07/04/20 1247  ETH <10    IMAGING past 24 hours No results found.  PHYSICAL EXAM Blood pressure (!) 152/75, pulse (!) 109, temperature 99 F (37.2 C), resp. rate 10, height 5\' 5"  (1.651 m), weight 80.4 kg, SpO2 100 %.  Status post right craniectomy with eye on the right swollen shut.  General: intubated, caucasian female, no apparent distress  Lungs: Symmetrical Chest rise, no labored breathing  Cardio: Regular Rate and Rhythm  Abdomen: Soft, non-tender  Neuro: intubated,  not able to follow any commands. Grimaces to noxious stimuli on the RUE and RLE, does not respond to noxious stimuli on the LUE and LLE.  She does wiggle of the right toes to commands.  Pupils are reactive.  Corneal reflexes are present although diminished.  Left extraocular movements are full unable to access the right due to massive swelling of the right eye.  Motor: Moves right arm and leg. No movement to noxious stimuli on the LUE or LLE.    ASSESSMENT/PLAN [name] is a 35 y.o. female with history of uncontrolled diabetesandhypertensionpresenting with left sided weakness and slurred speech.Per family there is a complicated social situation at this time. She lives intermittently in an apartment by herself but was kicked out and they went to pick her up at 5:30 AM. At that point she seemed "off"and generally quiet but did not seem to have any difficulty with left-sided weakness.Family attributed this to psychosocial issues and potential substance use noting that her sister passed away from an overdose in October. Later in the morning she complained of diffuse body pain and was administered gabapentin and a muscle relaxer.At 11:30 AM she stood up and walkedto the showerand at1215 she was found on the bottom of the tub with slurred speech and difficulty  moving the left side. She was not preactivated by EMS, but on arrival in the ED clearly appear to have a doorway examination consistent with a right MCA territory infarct.CTA revealed right ICA occlusion as well as a right common carotid bifurcation thrombus extending into the external carotid (felt to be the source of the occlusion). CT perfusion imaging demonstrated possible core infarct of 51 mL (though notably this was obtained within 2 hours of symptom onset and therefore may reflect an overestimate of the true core). Mismatch ratio was 2 with a penumbra of 63 mL. Case was discussed with Dr. Corliss Skains of neuro interventional radiology and Dr. Otelia Limes of [facility] and the patient was accepted for transfer to [facility] for emergent intervention. She was also started on TPA after consenting the patient's emergency contact with full discussion of risks of bleeding and importance of accurately determining the patient's last known well.[name] confirm the last known well as described above and agreed that the patient would want TPA given the risk/benefit.She confirmed that the patient had no recent procedures or other contraindications to TPA.Regarding her baseline, she is functional at baseline and family believes she is smoking at least1pack a day currently. She is on Metformin for her diabetes, does not take oral contraceptive pills, has had no recent surgeries or procedures, has not had any complaints of bleeding and family believes that she would have informed them of any significant bleeding.  She is being followed by Vascular surgery and Neurosurgery, will await further recommendations. Her body temperature is currently being controlled by the [employer] protocol. Her chest X-ray is negative for signs of pneumonia and blood cultures have been drawn and are awaiting results. Will continue to monitor. She has reached goal Na so 3% NaCl will be weaned off.  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   CTA head and neck showed right ICA terminal and proximal MCA occlusion, right CCA nonocclusive thrombus.   CT perfusion showed large penumbra.   Status post thrombectomy with right terminal ICA,  right PCA and right ACA TICI2breperfusion however does have distal micro emboli.   MRI showed large right MCA infarct with 83mm MLS and hemorrhagic transformation.   MRA showed patent intracranial vessels.   CT Head WO Contrast 3/26  620 - Improved midline shift and normalized left lateral ventricular volume after decompressive craniectomy. No progressive infarct or hemorrhage.  2D Echo- EF: 60-65%. No wall motion abnormality.  Consider TEE once stable  DVT US -No evidence of DVT bilaterally  LDL99  HgbA1c10.6  VTE prophylaxis -SCD's  No antithromboticprior to admission, now on No antithromboticdue to Hemorrhagic conversion  Therapy recommendations:Pending  Disposition:Pending  Cerebral edema  CT Head repeat 3/24 - Continued interval evolution of right MCA distribution infarct with associated hemorrhagic transformation. Worsened regional mass effect and edema with increased right-to-left midline shift now measuring up to 9 mm. Increased dilatation of the right lateral ventricle concerning for developing ventricular trapping. No other new acute intracranial abnormality.  CT repeat 3/25 unchanged right cerebral cytotoxic edema with central hemorrhage. Midline shift is similar at 1 cm. Unchanged left lateral ventricle dilatation with evidence of transependymal flow.  S/p decompressive hemicraniectomy with Dr. Venetia Maxon  CT Head WO Contrast 3/26  620 - Improved midline shift and normalized left lateral ventricular volume after decompressive craniectomy. No progressive  infarct or hemorrhage.  On keppra  3% saline off  S/p PICC line  Na goal 150-155  Na 137->141->147->150>158  Fever, central origin?  Tmax 101.7->102.5  Consider TEE once stable to rule out endocarditis  UA neg  CXR - Interval placement of a left upper extremity PICC with tip overlying the superior cavoatrial junction. No visible pneumothorax. Interval advancement of the endotracheal tube with tip now overlying the distal thoracic trachea.  B Cx pending  May need to consider arctic sun if persistent high grade fever  On Ancef  Respiratory failure  Intubated on vent  CCM on board  CXR - Interval placement of a left upper extremity PICC with tip overlying the superior cavoatrial junction. No visible pneumothorax. Interval advancement of the endotracheal tube with tip now overlying the distal thoracic trachea.  Hyperlipidemia  Home meds:none  LDL 99, goal < 70  On lipitor 40 now  Continue statin at discharge  Diabetes type II Uncontrolled  Home meds:Metformin, Glargine, Aspart  HgbA1c 10.6, goal < 7.0  CBGs  SSI  Cocaine abuse  UDS positive for cocaine  Cessation education will be provided  Tobacco abuse  Current smoker  Smoking cessation counseling will be provided  Other Stroke Risk Factors  Substance abuse - UDS: THC POSITIVEWill advise patientto stop using due to stroke risk.  Other Active Problems  Sick thyroid? - TSH: 0.307 (Low) Free T4:1.27 (high)   Blood Cultures draw 3/25  Hospital day # 4      This patient is critically ill due to stroke post procedure and at significant risk of neurological worsening, death form severe anemia, bleeding, recurrent stroke, intracranial stenosis. This patient's care requires constant monitoring of vital signs, hemodynamics, respiratory and cardiac monitoring, review of multiple databases, neurological assessment, other specialists and medical decision making of high complexity.  I spent 35 minutes of neurocritical care time in the care of this patient.     To contact Stroke Continuity provider, please refer to [email or web address]. After hours, contact General Neurology

## 2020-07-08 NOTE — Progress Notes (Signed)
Subjective: Patient reports intubated and sedated  Objective: Vital signs in last 24 hours: Temp:  [98.1 F (36.7 C)-99.3 F (37.4 C)] 98.8 F (37.1 C) (03/27 0700) Pulse Rate:  [54-118] 103 (03/27 0700) Resp:  [8-20] 17 (03/27 0700) BP: (102-164)/(36-97) 149/67 (03/27 0700) SpO2:  [96 %-100 %] 100 % (03/27 0700) FiO2 (%):  [40 %] 40 % (03/27 0719) Weight:  [80.4 kg] 80.4 kg (03/27 0310)  Intake/Output from previous day: 03/26 0701 - 03/27 0700 In: 1742.2 [I.V.:392.2; NG/GT:1150; IV Piggyback:200] Out: 1300 [Urine:1300] Intake/Output this shift: No intake/output data recorded.  Physical Exam: Cranial and abdominal dressings CDI.  Pupils are reactive.  Patient is vigorously moving right side, no spontaneous movement on left.  Upgoing great toe on left.  Lab Results: Recent Labs    07/07/20 0500 07/08/20 0528  WBC 15.2* 13.6*  HGB 7.0* 6.6*  HCT 27.7* 26.4*  PLT 146* 186   BMET Recent Labs    07/07/20 0500 07/07/20 1115 07/07/20 2300 07/08/20 0528  NA 154*   < > 156* 161*  K 3.5  --   --  3.4*  CL 125*  --   --  127*  CO2 23  --   --  28  GLUCOSE 366*  --   --  267*  BUN 9  --   --  9  CREATININE 0.58  --   --  0.53  CALCIUM 8.1*  --   --  8.8*   < > = values in this interval not displayed.    Studies/Results: DG Chest 1 View  Result Date: 07/07/2020 CLINICAL DATA:  Pneumonia EXAM: CHEST  1 VIEW COMPARISON:  07/06/2020 FINDINGS: Endotracheal tube terminates 6 cm above the carina. Very mild bibasilar opacities, likely atelectasis. No pleural effusion or pneumothorax. The heart is normal in size. Left arm PICC terminates the cavoatrial junction. IMPRESSION: Endotracheal tube terminates 6 cm above the carina. Electronically Signed   By: Charline Bills M.D.   On: 07/07/2020 09:28   CT HEAD WO CONTRAST  Result Date: 07/07/2020 CLINICAL DATA:  Stroke follow-up EXAM: CT HEAD WITHOUT CONTRAST TECHNIQUE: Contiguous axial images were obtained from the base of the  skull through the vertex without intravenous contrast. COMPARISON:  Yesterday FINDINGS: Brain: Decompressive craniectomy with improved midline shift and better drainage of the left lateral ventricle. Midline shift now measures 5 mm. Right MCA territory infarct affecting cortex and subcortical structures with patchy hazy hemorrhage that is non progressed. No new infarct is seen. Vascular: No hyperdense vessel or unexpected calcification. Skull: Unremarkable decompressive craniectomy on the right Sinuses/Orbits: Nasal septal perforation. Generalized mucosal thickening in the paranasal sinuses. IMPRESSION: Improved midline shift and normalized left lateral ventricular volume after decompressive craniectomy. No progressive infarct or hemorrhage Electronically Signed   By: Marnee Spring M.D.   On: 07/07/2020 06:18   DG Chest Port 1 View  Result Date: 07/06/2020 CLINICAL DATA:  Post central line placement. EXAM: PORTABLE CHEST 1 VIEW COMPARISON:  Chest radiograph July 04, 2020. FINDINGS: Interval placement of a left upper extremity PICC with tip overlying the superior cavoatrial junction. Interval advancement of the endotracheal tube with tip now overlying the distal thoracic trachea 1.6 cm from the carina. Enteric tube coursing below the diaphragm with tip obscured by collimation. The heart size and mediastinal contours are unchanged. No focal consolidation. No visible pneumothorax. The visualized skeletal structures are unremarkable. IMPRESSION: 1. Interval placement of a left upper extremity PICC with tip overlying the superior cavoatrial junction. No  visible pneumothorax. 2. Interval advancement of the endotracheal tube with tip now overlying the distal thoracic trachea. Electronically Signed   By: Maudry Mayhew MD   On: 07/06/2020 19:12   [place] EKG SITE RITE  Result Date: 07/06/2020 If Site Rite image not attached, placement could not be confirmed due to current cardiac rhythm.   Assessment/Plan: Patient  is neutrologically stable to improved.  3 % NaCl D/Ced for Na 161.  Further management per Stroke Service.    LOS: 4 days    Dorian Heckle, MD 07/08/2020, 7:45 AM

## 2020-07-08 NOTE — Progress Notes (Signed)
PT Cancellation Note  Patient Details Name: Brandi Myers MRN: [id number] DOB: 09-Apr-1986   Cancelled Treatment:    Reason Eval/Treat Not Completed: Patient not medically ready Checked in with RN; pt remains intubated, receiving blood transfusion, and still has arctic sun on. Will follow.  Lillia Pauls, PT, DPT Acute Rehabilitation Services Pager [phone] Office [phone]    [name] 07/08/2020, 9:15 AM

## 2020-07-08 NOTE — Progress Notes (Signed)
OT Cancellation Note  Patient Details Name: ESMERALDA MALAY MRN: [id number] DOB: 10-Dec-1985   Cancelled Treatment:    Reason Eval/Treat Not Completed: Patient not medically ready (Checked in with RN; pt remains intubated, receiving blood transfusion, and still has arctic sun on. Will follow.)   Flora Lipps, OTR/L Acute Rehabilitation Services Pager: [phone] Office: [phone]   Donny Heffern C 07/08/2020, 10:02 AM

## 2020-07-08 NOTE — Progress Notes (Addendum)
  Progress Note    07/08/2020 9:43 AM 2 Days Post-Op  Subjective:  Intubated and sedated. Responds to painful stimulus   Vitals:   07/08/20 0900 07/08/20 0911  BP: (!) 153/76 (!) 153/76  Pulse: 81 85  Resp: (!) 9 10  Temp: 99 F (37.2 C) 99 F (37.2 C)  SpO2: 100% 100%   Physical Exam: Cardiac:  regular Lungs:  intubated Incisions: Superficial dehiscence of right brachial incision. Dressings clean and dry. Steri strips in place.  Extremities:  Well perfused and warm. Palpable right brachial pulse. Doppler Radial and ulnar signals Neurologic: sedated  CBC    Component Value Date/Time   WBC 13.6 (H) 07/08/2020 0528   RBC 3.45 (L) 07/08/2020 0528   HGB 6.6 (LL) 07/08/2020 0528   HGB 11.4 (L) 10/22/2012 1800   HCT 26.4 (L) 07/08/2020 0528   HCT 31.4 (L) 10/23/2012 0518   PLT 186 07/08/2020 0528   PLT 191 10/22/2012 1800   MCV 76.5 (L) 07/08/2020 0528   MCV 87 10/22/2012 1800   MCH 19.1 (L) 07/08/2020 0528   MCHC 25.0 (L) 07/08/2020 0528   RDW 22.5 (H) 07/08/2020 0528   RDW 14.3 10/22/2012 1800   LYMPHSABS 1.4 07/05/2020 0541   LYMPHSABS 2.4 10/22/2012 1800   MONOABS 0.1 07/05/2020 0541   MONOABS 1.0 (H) 10/22/2012 1800   EOSABS 0.1 07/05/2020 0541   EOSABS 0.1 10/22/2012 1800   BASOSABS 0.0 07/05/2020 0541   BASOSABS 0.1 10/22/2012 1800    BMET    Component Value Date/Time   NA 161 (HH) 07/08/2020 0528   K 3.4 (L) 07/08/2020 0528   CL 127 (H) 07/08/2020 0528   CO2 28 07/08/2020 0528   GLUCOSE 267 (H) 07/08/2020 0528   BUN 9 07/08/2020 0528   CREATININE 0.53 07/08/2020 0528   CALCIUM 8.8 (L) 07/08/2020 0528   GFRNONAA >60 07/08/2020 0528   GFRAA >60 01/04/2018 2056    INR    Component Value Date/Time   INR 1.2 07/04/2020 1337     Intake/Output Summary (Last 24 hours) at 07/08/2020 0943 Last data filed at 07/08/2020 0911 Gross per 24 hour  Intake 2083.36 ml  Output 1050 ml  Net 1033.36 ml     Assessment/Plan:  35 y.o. female is s/p post  right brachial embolectomy. RUE well perfused and warm with palpable brachial pulse, Doppler Ulnar and radial signals. Has some superficial dehiscence of her incision. Steri strips in place. No drainage. Continue local wound care for now.  Medical management per primary.       Graceann Congress, PA-C [facility] [phone] 07/08/2020 9:43 AM   VASCULAR STAFF ADDENDUM: I have independently interviewed and examined the patient. I agree with the above.  Wound looks good s/p steri strips Good DS in L radial artery  Rande Brunt. Lenell Antu, MD [facility] Phone Number: [phone] 07/08/2020 11:28 AM

## 2020-07-08 NOTE — Progress Notes (Signed)
Date and time results received: 07/08/20 0640    Test: sodium Critical Value: 161  Name of Provider Notified: N/A  Orders Received? Or Actions Taken?: Will pass on to day shift RN, see new orders.

## 2020-07-08 NOTE — Progress Notes (Signed)
eLink Physician-Brief Progress Note Patient Name: Brandi Myers DOB: 15-Aug-1985 MRN: [id number]   Date of Service  07/08/2020  HPI/Events of Note    Hg < 7, no bleeding.   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  1 PRBC and follow post Hg/HCT     Intervention Category Intermediate Interventions: Diagnostic test evaluation  Ranee Gosselin 07/08/2020, 6:23 AM

## 2020-07-08 NOTE — Progress Notes (Signed)
NAME:  Brandi Myers, MRN:  [id number], DOB:  Nov 30, 1985, LOS: 4 ADMISSION DATE:  07/04/2020, CONSULTATION DATE:  07/04/2020 REFERRING MD:  Dr. Corliss Skains, CHIEF COMPLAINT:  Vent management    History of Present Illness:  Brandi Myers is a 35 y.o. with PMH significant for HTN and type 2 diabetes who presented as a code stroke due to left sided weakness and slurred speech.  On arrival patient was also seen altered/combative requiring intubation to obtain CT imaging to rule out stroke.  Head CT demonstrated right ICA occlusion as well as right common carotid bifurcation thrombus resulting in acute stroke.  Patient received TPA in ED at [facility] after which she was transferred to [facility] for further neurological work-up and interventions including thrombectomy.  On arrival to [facility] patient underwent bilateral common carotid arteriograms with revascularization of occluded terminal ICA and right MCA and right ACA.  Post intervention patient will remain intubated and was transferred to neuro ICU. Critical care consulted for ventilator management  Pertinent  Medical History   Uncontrolled diabetes and hypertension  Significant Hospital Events: Including procedures, antibiotic start and stop dates in addition to other pertinent events   .  3/23 > admitted as a code stroke and found to have occluded right ICA resulting in stroke.  Underwent IR revascularization and remained intubated post procedure . 3/24 R brachial clot requiring thrombectomy, hemorrhagic transformation on MRI and worsening midline shift on imaging . 3/25 NSGY decompressive craniectomy, TF started . 3/26 fevers post op, holding abx, SBT/SAT  Interim History / Subjective:  Stable on vent. H/H drifting down.  Objective   Blood pressure 131/70, pulse 88, temperature 98.6 F (37 C), temperature source Rectal, resp. rate 16, height 5\' 5"  (1.651 m), weight 80.4 kg, SpO2 100 %.    Vent Mode: PRVC FiO2 (%):  [40 %] 40 % Set  Rate:  [16 bmp] 16 bmp Vt Set:  [450 mL] 450 mL PEEP:  [5 cmH20] 5 cmH20 Pressure Support:  [0 cmH20] 0 cmH20 Plateau Pressure:  [10 cmH20-14 cmH20] 14 cmH20   Intake/Output Summary (Last 24 hours) at 07/08/2020 07/10/2020 Last data filed at 07/08/2020 0500 Gross per 24 hour  Intake 1586.75 ml  Output 1300 ml  Net 286.75 ml   Filed Weights   07/04/20 1730 07/06/20 2200 07/08/20 0310  Weight: 72.2 kg 72.3 kg 80.4 kg    Examination: Constitutional: ill appearing woman on vent  Eyes: eyes are swollen shut, cannot eval Ears, nose, mouth, and throat: ETT in place, small thick purulent secretions Cardiovascular: RRR, ext warm Respiratory: clear, triggering vent Gastrointestinal: soft, +BS Skin: No rashes, normal turgor Neurologic: moves R side to command, L side remains hemiparetic Psychiatric: cannot assess  Sugars up Sodium very high Cr okay  Resolved Hospital Problem list     Assessment & Plan:  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 (edema/hemorrhage)- s/p decompressive craniectomy 3/25 Acute respiratory insufficiency secondary to above- on vent Uncontrolled type 2 diabetes- needs tighter control R limb critical limb ischemia- s/p thrombectomy, site looks good Low TSH, mildly elevated t4- OP repeat labs once drugs out of system 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, hx polysubstance abuse Tobacco abuse Fevers post op- suspect central but she does have thick purulent secretions and GPCs in sputum  - Ceftriaxone and 1 dose of vanc while we wait on tracheal aspirate growth - Start FWF, try to get her to 145-155 sodium; higher sodiums lead to more difficult pulmonary toileting -  Continue vent support, VAP prevention bundle - Cortrak tomorrow and hopefully extubation, want to see secretions and sodium a bit more reasonable; also getting a cortrak will lower risk of hypoglycemia with TF interruptions - Continue arctic sun and standing tylenol for now - Increase  levemir + TF coverage; goal CBG 100-180 - Postop site management per NSGY - GDMT and further CVA workup per neurology - Will follow with you  Best practice (evaluated daily)  Diet: TF Pain/Anxiety/Delirium protocol (if indicated): PRNs for now VAP protocol (if indicated): Yes DVT prophylaxis: SCD GI prophylaxis: PPI Glucose control:  Basal + SSI Central venous access:  N/A Arterial line:  N/A Foley:  yes Mobility:  bed rest  PT consulted: Yes Last date of multidisciplinary goals of care discussion: per primary Code Status:  full code Disposition: ICU   Patient critically ill due to respiratory failure Interventions to address this today ventilator weaning Risk of deterioration without these interventions is high  I personally spent 33 minutes providing critical care not including any separately billable procedures  Myrla Halsted MD [facility]  Prefer epic messenger for cross cover needs If after hours, please call E-link

## 2020-07-09 DIAGNOSIS — I63131 Cerebral infarction due to embolism of right carotid artery: Secondary | ICD-10-CM

## 2020-07-09 DIAGNOSIS — I6601 Occlusion and stenosis of right middle cerebral artery: Secondary | ICD-10-CM | POA: Diagnosis not present

## 2020-07-09 LAB — BPAM RBC
Blood Product Expiration Date: 202204252359
ISSUE DATE / TIME: 202203270841
Unit Type and Rh: 5100

## 2020-07-09 LAB — BASIC METABOLIC PANEL
Anion gap: 7 (ref 5–15)
BUN: 9 mg/dL (ref 6–20)
CO2: 31 mmol/L (ref 22–32)
Calcium: 8.7 mg/dL — ABNORMAL LOW (ref 8.9–10.3)
Chloride: 118 mmol/L — ABNORMAL HIGH (ref 98–111)
Creatinine, Ser: 0.49 mg/dL (ref 0.44–1.00)
GFR, Estimated: >60 mL/min (ref >60)
Glucose, Bld: 160 mg/dL — ABNORMAL HIGH (ref 70–99)
Potassium: 3.4 mmol/L — ABNORMAL LOW (ref 3.5–5.1)
Sodium: 156 mmol/L — ABNORMAL HIGH (ref 135–145)

## 2020-07-09 LAB — TYPE AND SCREEN
ABO/RH(D): O POS
Antibody Screen: NEGATIVE
Unit division: 0

## 2020-07-09 LAB — CULTURE, RESPIRATORY W GRAM STAIN

## 2020-07-09 LAB — SURGICAL PATHOLOGY

## 2020-07-09 LAB — SODIUM
Sodium: 157 mmol/L — ABNORMAL HIGH (ref 135–145)
Sodium: 156 mmol/L — ABNORMAL HIGH (ref 135–145)
Sodium: 153 mmol/L — ABNORMAL HIGH (ref 135–145)

## 2020-07-09 LAB — CBC
HCT: 30 % — ABNORMAL LOW (ref 36.0–46.0)
Hemoglobin: 7.7 g/dL — ABNORMAL LOW (ref 12.0–15.0)
MCH: 20.2 pg — ABNORMAL LOW (ref 26.0–34.0)
MCHC: 25.7 g/dL — ABNORMAL LOW (ref 30.0–36.0)
MCV: 78.7 fL — ABNORMAL LOW (ref 80.0–100.0)
Platelets: 200 10*3/uL (ref 150–400)
RBC: 3.81 MIL/uL — ABNORMAL LOW (ref 3.87–5.11)
RDW: 23.6 % — ABNORMAL HIGH (ref 11.5–15.5)
WBC: 11.1 10*3/uL — ABNORMAL HIGH (ref 4.0–10.5)
nRBC: 0 % (ref 0.0–0.2)

## 2020-07-09 LAB — MAGNESIUM: Magnesium: 1.9 mg/dL (ref 1.7–2.4)

## 2020-07-09 LAB — PHOSPHORUS: Phosphorus: 4.3 mg/dL (ref 2.5–4.6)

## 2020-07-09 LAB — GLUCOSE, CAPILLARY
Glucose-Capillary: 146 mg/dL — ABNORMAL HIGH (ref 70–99)
Glucose-Capillary: 135 mg/dL — ABNORMAL HIGH (ref 70–99)
Glucose-Capillary: 125 mg/dL — ABNORMAL HIGH (ref 70–99)
Glucose-Capillary: 71 mg/dL (ref 70–99)
Glucose-Capillary: 116 mg/dL — ABNORMAL HIGH (ref 70–99)
Glucose-Capillary: 85 mg/dL (ref 70–99)

## 2020-07-09 MED ORDER — FREE WATER
200.0000 mL | Freq: Four times a day (QID) | Status: DC
  Administered 2020-07-09 – 2020-07-12 (×12): 200 mL

## 2020-07-09 NOTE — Progress Notes (Signed)
SLP Cancellation Note  Patient Details Name: WHITNIE DELEON MRN: [id number] DOB: [birth year]   Cancelled treatment:        Remains sedated and on vent. Will follow.    Royce Macadamia 07/09/2020, 2:18 PM  Breck Coons Lonell Face.Ed Nurse, children's [phone] Office [phone]

## 2020-07-09 NOTE — Progress Notes (Signed)
OT Cancellation Note  Patient Details Name: Brandi Myers MRN: [id number] DOB: 1986/01/31   Cancelled Treatment:    Reason Eval/Treat Not Completed: Patient not medically ready;Other (comment) (incontinence of large volume with staff helping clean the patient)   Wynona Neat, OTR/L  Acute Rehabilitation Services Pager: [phone] Office: [phone] .  07/09/2020, 12:09 PM

## 2020-07-09 NOTE — Progress Notes (Signed)
Subjective: Patient reports sedated and on vent. She is unresponsive and not following commands.   Objective: Vital signs in last 24 hours: Temp:  [98.2 F (36.8 C)-99.1 F (37.3 C)] 98.2 F (36.8 C) (03/28 0700) Pulse Rate:  [62-105] 103 (03/28 0600) Resp:  [9-28] 16 (03/28 0600) BP: (127-171)/(53-85) 156/81 (03/28 0600) SpO2:  [97 %-100 %] 100 % (03/28 0600) FiO2 (%):  [40 %] 40 % (03/28 0329)  Intake/Output from previous day: 03/27 0701 - 03/28 0700 In: 3287.4 [I.V.:462.4; Blood:275; NG/GT:1750; IV Piggyback:800] Out: 1100 [Urine:1100] Intake/Output this shift: No intake/output data recorded.  Physical Exam: Patient is unresponsive and unable to follow commands. PERRL. Patient with movent on the right side to noxious stimuli, RLE > RUE. No movement on the left.  Postive Babinski's reflex on the left. Cranial and abdominal dressings are clean dry intact. Incisions are well approximated with no drainage, erythema, or edema.    Lab Results: Recent Labs    07/08/20 0528 07/08/20 1646 07/09/20 0553  WBC 13.6*  --  11.1*  HGB 6.6* 7.9* 7.7*  HCT 26.4* 31.4* 30.0*  PLT 186  --  200   BMET Recent Labs    07/08/20 0528 07/08/20 1046 07/08/20 2300 07/09/20 0553  NA 161*   < > 157* 156*  K 3.4*  --   --  3.4*  CL 127*  --   --  118*  CO2 28  --   --  31  GLUCOSE 267*  --   --  160*  BUN 9  --   --  9  CREATININE 0.53  --   --  0.49  CALCIUM 8.8*  --   --  8.7*   < > = values in this interval not displayed.    Studies/Results: No results found.  Assessment/Plan: Patient remains neurologically stable. No new neurosurgical recommendations at this time. Continue supportive care. Further management per stroke team.   LOS: 5 days     Council Mechanic, DNP, NP-C 07/09/2020, 8:12 AM

## 2020-07-09 NOTE — Progress Notes (Signed)
PT Cancellation Note  Patient Details Name: SYDELL PROWELL MRN: [id number] DOB: May 04, 1985   Cancelled Treatment:    Reason Eval/Treat Not Completed: Other (comment) incontinence of large volume and staff helping clean up patient.  Lillia Pauls, PT, DPT Acute Rehabilitation Services Pager [phone] Office [phone]    [name] 07/09/2020, 12:09 PM

## 2020-07-09 NOTE — Progress Notes (Addendum)
NAME:  Brandi Myers, MRN:  [id number], DOB:  [birth year], LOS: 5 ADMISSION DATE:  07/04/2020, CONSULTATION DATE:  07/04/2020 REFERRING MD:  Dr. Corliss Skains, CHIEF COMPLAINT:  Vent management    History of Present Illness:  Brandi Myers is a 35 y.o. with PMH significant for HTN and type 2 diabetes who presented as a code stroke due to left sided weakness and slurred speech. Head CT demonstrated right ICA occlusion as well as right common carotid bifurcation thrombus resulting in acute stroke. Patient received TPA in ED at [facility]. Transferred to [facility] where she underwent revascularization of occluded terminal ICA and right MCA and right ACA. Patient remained intubated and was transferred to neuro ICU. Critical care consulted for ventilator management   Pertinent  Medical History   Uncontrolled diabetes and hypertension  Significant Hospital Events: Including procedures, antibiotic start and stop dates in addition to other pertinent events   .  3/23 > admitted as a code stroke and found to have occluded right ICA resulting in stroke.  Underwent IR revascularization and remained intubated post procedure . 3/24 R brachial clot requiring thrombectomy, hemorrhagic transformation on MRI and worsening midline shift on imaging . 3/25 NSGY decompressive craniectomy, TF started . 3/26 fevers post op, holding abx, SBT/SAT . 3/27 Tracheal aspirate with S.aureus and strep pneumo- started CTX and vanc  Interim History / Subjective:  Stable on vent. Sugars down to 140s, sodium down to 156, Hgb up to 7.7 s/p 1u RBCs on 3/27. Cortrak placed today.   Objective   Blood pressure (!) 152/69, pulse 74, temperature 98.2 F (36.8 C), temperature source Rectal, resp. rate 16, height 5\' 5"  (1.651 m), weight 80.4 kg, SpO2 99 %.    Vent Mode: PRVC FiO2 (%):  [30 %-40 %] 30 % Set Rate:  [16 bmp] 16 bmp Vt Set:  [450 mL] 450 mL PEEP:  [5 cmH20] 5 cmH20 Pressure Support:  [0 cmH20] 0 cmH20 Plateau  Pressure:  [12 cmH20-16 cmH20] 16 cmH20   Intake/Output Summary (Last 24 hours) at 07/09/2020 1000 Last data filed at 07/09/2020 07/11/2020 Gross per 24 hour  Intake 2613.54 ml  Output 800 ml  Net 1813.54 ml   Filed Weights   07/04/20 1730 07/06/20 2200 07/08/20 0310  Weight: 72.2 kg 72.3 kg 80.4 kg    Examination: Constitutional: ill appearing woman on vent  Eyes: eyes are swollen shut Ears, nose, mouth, and throat: ETT in place, small thick purulent secretions Cardiovascular: RRR, ext warm Respiratory: clear, triggering vent Gastrointestinal: soft, +BS Skin: No rashes, normal turgor. Cranial and abdominal dressings are clean dry intact. 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 aureus and strep pnemo isolated on tracheal aspirate on 3/27 - Continue CTX 2g daily (day 2 today), stop vanc  Hypernatremia - Na down to 156, goal 150-155. - Continue FWF, 4/27 q6h - Continue NS - s/p 3% NaCl- stopped on 3/26.   Low Hgb - Improved s/p 1u RBCs on 3/27 - CTM  Uncontrolled  type 2 diabetes - Glucose improved today - Continue levemir + TF coverage; goal CBG 100-180  R limb critical limb ischemia - S/p thrombectomy, site looks good  Low TSH, mildly elevated t4 - OP repeat labs once drugs out of system  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, hx polysubstance abuse Tobacco abuse  Best practice (evaluated daily)  Diet: Cortrak Pain/Anxiety/Delirium protocol (if  indicated): scheduled tylenol, fentanyl, PRNs VAP protocol (if indicated): Yes DVT prophylaxis: SCD GI prophylaxis: PPI Glucose control:  Basal + SSI Central venous access:  N/A Arterial line:  N/A Foley:  yes Mobility:  bed rest  PT consulted: Yes Last date of multidisciplinary goals of care discussion: per primary Code Status:  full code Disposition: ICU  Patient critically ill due to respiratory failure Interventions to address today: ventilator weaning Risk of deterioration without these interventions is high  I personally spent 40 minutes providing critical care not including any separately billable procedures  [name], MS4 [facility] [employer]  Prefer epic messenger for cross cover needs If after hours, please call E-link   Attending note: I have seen and examined the patient. History, labs and imaging reviewed.  35 year old with hypertension, diabetes presenting with right ICA occlusion and stroke TPA and revascularization.  Developed significant brain edema requiring decompressive craniectomy on 3/25 Remains on the ventilator with poor mental status  Blood pressure (!) 160/75, pulse (!) 113, temperature 98.8 F (37.1 C), temperature source Axillary, resp. rate 16, height 5\' 5"  (1.651 m), weight 80.4 kg, SpO2 100 %. Gen:      No acute distress, craniotomy incision HEENT:  EOMI, sclera anicteric Neck:     No masses; no thyromegaly, ET tube Lungs:    Clear to auscultation bilaterally; normal respiratory effort CV:         Regular rate and rhythm; no murmurs Abd:      + bowel sounds; soft, non-tender; no palpable masses, no distension Ext:    No edema; adequate peripheral perfusion Skin:      Warm and dry; no rash Neuro: Sedated, unresponsive  Labs/Imaging personally reviewed, significant for Sodium 156, BUN/creatinine 9/0.49, potassium 3.4 WBC 11.1, hemoglobin 7.7, platelets 200  No new imaging  Assessment/plan: Right ICA stroke with edema Status  post revascularization, craniotomy Off 3% saline.  Continue to monitor labs Arctic sun to prevent fevers  Acute respiratory failure Staph aureus, pneumococcus pneumonia Narrow antibiotics to ceftriaxone as the organisms are sensitive Stop vancomycin  Diabetes Increased insulin yesterday.  Blood sugars are under better control.  Continue monitoring  The patient is critically ill with multiple organ systems failure and requires high complexity decision making for assessment and support, frequent evaluation and titration of therapies, application of advanced monitoring technologies and extensive interpretation of multiple databases.  Critical care time - 35 mins. This represents my time independent of the NPs time taking care of the pt.  04-29-1988 MD [facility] 07/09/2020, 4:23 PM

## 2020-07-09 NOTE — Progress Notes (Addendum)
[facility]  Subjective  - Intubated, moves ext. spontaneously.    Objective (!) 156/81 (!) 103 98.2 F (36.8 C) (Rectal) 16 100%  Intake/Output Summary (Last 24 hours) at 07/09/2020 0727 Last data filed at 07/09/2020 8841 Gross per 24 hour  Intake 3287.35 ml  Output 1100 ml  Net 2187.35 ml    Right UE incision healing well, steri strips in place Doppler radial/ulnar signals Lungs intubated Heart RRR  Assessment/Planning: 35 y.o. female is s/p post right brachial embolectomy.  Right UE well perfused with doppler signals distal radial and ulnar.   AC incision healing well.  Mosetta Pigeon 07/09/2020 7:27 AM --  Laboratory Lab Results: Recent Labs    07/08/20 0528 07/08/20 1646 07/09/20 0553  WBC 13.6*  --  11.1*  HGB 6.6* 7.9* 7.7*  HCT 26.4* 31.4* 30.0*  PLT 186  --  200   BMET Recent Labs    07/08/20 0528 07/08/20 1046 07/08/20 2300 07/09/20 0553  NA 161*   < > 157* 156*  K 3.4*  --   --  3.4*  CL 127*  --   --  118*  CO2 28  --   --  31  GLUCOSE 267*  --   --  160*  BUN 9  --   --  9  CREATININE 0.53  --   --  0.49  CALCIUM 8.8*  --   --  8.7*   < > = values in this interval not displayed.    COAG Lab Results  Component Value Date   INR 1.2 07/04/2020   No results found for: PTT  I have seen and evaluated the patient. I agree with the PA note as documented above.  Postop day 4 status post right upper extremity thrombectomy.  Incision looks good at the antecubitum.  She has very brisk radial signal at the wrist.  Hand is well-perfused.  Cephus Shelling, MD [facility] Office: [phone]

## 2020-07-09 NOTE — Procedures (Signed)
Cortrak  Person Inserting Tube:  Osa Craver, RD Tube Type:  Cortrak - 43 inches Tube Location:  Right nare Initial Placement:  Stomach Secured by: Bridle Technique Used to Measure Tube Placement:  Documented cm marking at nare/ corner of mouth Cortrak Secured At:  60 cm    Cortrak Tube Team Note:  Consult received to place a Cortrak feeding tube.   No x-ray is required. RN may begin using tube.   If the tube becomes dislodged please keep the tube and contact the Cortrak team at [email or web address] ([email or web address]) for replacement.  If after hours and replacement cannot be delayed, place a NG tube and confirm placement with an abdominal x-ray.   Romelle Starcher MS, RDN, LDN, CNSC Registered Dietitian III Clinical Nutrition RD Pager and On-Call Pager Number Located in [place]

## 2020-07-09 NOTE — Progress Notes (Addendum)
STROKE TEAM PROGRESS NOTE   INTERVAL HISTORY No acute events overnight.  She is still intubated. Her Right arm continues to be difficult to palpate a pulse but is confirmed by Doppler and is warm. She is able to move her right arm and leg to commands. She has no movement in her Left arm or leg. She is having a CorTrak placed today.  NeuroSurgery, Vascular Surgery, and CCM are following.  Vitals:   07/09/20 0900 07/09/20 1000 07/09/20 1100 07/09/20 1130  BP: (!) 152/69 (!) 155/88 (!) 160/75   Pulse: 74 (!) 101 (!) 113   Resp: 16 17 16    Temp: 98.8 F (37.1 C) 98.6 F (37 C) 99 F (37.2 C)   TempSrc:      SpO2: 99% 97% 99% 100%  Weight:      Height:       CBC:  Recent Labs  Lab 07/04/20 1247 07/04/20 1844 07/05/20 0541 07/06/20 0240 07/08/20 0528 07/08/20 1646 07/09/20 0553  WBC 17.8*  --  14.2*   < > 13.6*  --  11.1*  NEUTROABS 12.7*  --  12.5*  --   --   --   --   HGB 9.5*   < > 8.6*   < > 6.6* 7.9* 7.7*  HCT 35.0*   < > 32.0*   < > 26.4* 31.4* 30.0*  MCV 67.2*  --  69.3*   < > 76.5*  --  78.7*  PLT 235  --  160   < > 186  --  200   < > = values in this interval not displayed.   Basic Metabolic Panel:  Recent Labs  Lab 07/08/20 0528 07/08/20 1046 07/08/20 1619 07/08/20 2300 07/09/20 0553  NA 161*   < > 158* 157* 156*  K 3.4*  --   --   --  3.4*  CL 127*  --   --   --  118*  CO2 28  --   --   --  31  GLUCOSE 267*  --   --   --  160*  BUN 9  --   --   --  9  CREATININE 0.53  --   --   --  0.49  CALCIUM 8.8*  --   --   --  8.7*  MG 2.1  --  1.9  --  1.9  PHOS 3.0  --  3.3  --  4.3   < > = values in this interval not displayed.   Lipid Panel:  Recent Labs  Lab 07/05/20 0541 07/08/20 0500  CHOL 154  --   TRIG 79  86 82  HDL 39*  --   CHOLHDL 3.9  --   VLDL 16  --   LDLCALC 99  --    HgbA1c:  Recent Labs  Lab 07/04/20 1814  HGBA1C 10.6*   Urine Drug Screen:  Recent Labs  Lab 07/05/20 0010  LABOPIA NONE DETECTED  COCAINSCRNUR POSITIVE*   LABBENZ NONE DETECTED  AMPHETMU NONE DETECTED  THCU POSITIVE*  LABBARB NONE DETECTED    Alcohol Level  Recent Labs  Lab 07/04/20 1247  ETH <10    IMAGING past 24 hours No results found.  PHYSICAL EXAM Blood pressure (!) 160/75, pulse (!) 113, temperature 99 F (37.2 C), resp. rate 16, height 5\' 5"  (1.651 m), weight 80.4 kg, SpO2 100 %.  General: intubated, caucasian female, no apparent distress right scalp and  bilateral eyelid swelling from surgery  Lungs: Symmetrical Chest rise, no labored breathing  Cardio: Regular Rate and Rhythm  Abdomen: Soft, non-tender  Neurological Exam ; : Intubated.sedated eyes closed.  Drowsy but arouses and able to follow simple commands in the midline and on the right..  Pupils small equal reactive.  Left lower facial weakness.  Tongue midline.  Motor: Moves RUE and RLE against gravity purposefully and to commands Does not move LUE and LLE to noxious stimuli. Tone and bulk:normal tone throughout; no atrophy noted    ASSESSMENT/PLAN Brandi Myers is a 35 y.o. female with history of uncontrolled diabetesandhypertensionpresenting with left sided weakness and slurred speech.Per family there is a complicated social situation at this time. She lives intermittently in an apartment by herself but was kicked out and they went to pick her up at 5:30 AM. At that point she seemed "off"and generally quiet but did not seem to have any difficulty with left-sided weakness.Family attributed this to psychosocial issues and potential substance use noting that her sister passed away from an overdose in October. Later in the morning she complained of diffuse body pain and was administered gabapentin and a muscle relaxer.At 11:30 AM she stood up and walkedto the showerand at1215 she was found on the bottom of the tub with slurred speech and difficulty moving the left side. She was not preactivated by EMS, but on arrival in the ED clearly appear to  have a doorway examination consistent with a right MCA territory infarct.CTA revealed right ICA occlusion as well as a right common carotid bifurcation thrombus extending into the external carotid (felt to be the source of the occlusion). CT perfusion imaging demonstrated possible core infarct of 51 mL (though notably this was obtained within 2 hours of symptom onset and therefore may reflect an overestimate of the true core). Mismatch ratio was 2 with a penumbra of 63 mL. Case was discussed with Dr. Corliss Skainseveshwar of neuro interventional radiology and Dr. Ot[facility]zen [facility] and the patient was accepted for transfer to [facility] for emergent intervention. She was also started on TPA after consenting the patient's emergency contact with full discussion of risks of bleeding and importance of accurately determining the patient's last known well.Brandi Myers confirm the last known well as described above and agreed that the patient would want TPA given the risk/benefit.She confirmed that the patient had no recent procedures or other contraindications to TPA.Regarding her baseline, she is functional at baseline and family believes she is smoking at least1pack a day currently. She is on Metformin for her diabetes, does not take oral contraceptive pills, has had no recent surgeries or procedures, has not had any complaints of bleeding and family believes that she would have informed them of any significant bleeding.   She is being followed by Vascular surgery and Neurosurgery, will await further recommendations. Respiratory culture grew Strep Pneumo, still on Ceftriaxone. Blood culture no growth at 3 days. Had CorTrak placed today. Will give her a few more days to recover post craniometry before extubating. May do Transcranial US bubble study in the next day or two to rule out PFO.   Right MCA embolic infarct secondary to Right ICA occlusion s/prevascularization occluded terminal ICA and Rt MCA and RT  ACA A1with Hemorrhagic Conversion and malignant cerebral edema and brain herniation requiring right hemicraniectomy  CT no acute finding but suspicious for right MCA early ischemic changes.   CTA head and neck showed right ICA terminal and proximal MCA occlusion, right CCA nonocclusive thrombus.  CT  perfusion showed large penumbra.   Status post thrombectomy with right terminal ICA, right PCA and right ACA TICI2breperfusion however does have distal micro emboli.  MRI showed large right MCA infarct with 84mm MLS and hemorrhagic transformation.   MRA showed patent intracranial vessels.  CT Head WO Contrast 3/26  620 - Improved midline shift and normalized left lateral ventricular volume after decompressive craniectomy. No progressive infarct or hemorrhage.  2D Echo- EF: 60-65%. No wall motion abnormality.  Consider TEE once stable  DVT US -No evidence of DVT bilaterally  LDL99  HgbA1c10.6  VTE prophylaxis -SCD's  No antithromboticprior to admission, now on No antithromboticdue to Hemorrhagic conversion  Therapy recommendations:Pending  Disposition:Pending  Cerebral edema  CT Headrepeat3/24 -Continued interval evolution of right MCA distribution infarct with associated hemorrhagic transformation. Worsened regional mass effect and edema with increased right-to-left midline shift now measuring up to 9 mm.Increased dilatation of the right lateral ventricle concerning for developing ventricular trapping. No other new acute intracranial abnormality.  CT repeat 3/25 unchangedright cerebral cytotoxic edema with central hemorrhage. Midline shift is similar at 1 cm.Unchanged left lateral ventricle dilatation with evidence of transependymal flow.  S/pdecompressive hemicraniectomywith Dr. Venetia Maxon  CT Head WO Contrast 3/26  620 - Improved midline shift and normalized left lateral ventricular volume after decompressive craniectomy. No progressive infarct or  hemorrhage.  On keppra  3% saline off  S/p PICC line  Na goal 150-155  Na 137->141->147->150>158  Fever, central origin?  Tmax 101.7->102.5  Consider TEE once stable to rule out endocarditis  UA neg  CXR - Interval placement of a left upper extremity PICC with tip overlying the superior cavoatrial junction. No visible pneumothorax. Interval advancement of the endotracheal tube with tip now overlying the distal thoracic trachea.  Respiratory Culture - Strep Pneumoniae  B CxNo growth at 3 days  On Arctic Sun  On Ancef  Respiratory failure  Intubated on vent  CCM on board  CXR - Interval placement of a left upper extremity PICC with tip overlying the superior cavoatrial junction. No visible pneumothorax. Interval advancement of the endotracheal tube with tip now overlying the distal thoracic trachea.  Respiratory Culture - Strep Pneumoniae  Hyperlipidemia  Home meds:none  LDL 99, goal < 70  On lipitor 40 now  Continue statin at discharge  Diabetes type II Uncontrolled  Home meds:Metformin, Glargine, Aspart  HgbA1c 10.6, goal < 7.0  CBGs  SSI  Cocaine abuse  UDS positive for cocaine  Cessation education will be provided  Tobacco abuse  Current smoker  Smoking cessation counselingwill beprovided  Other Stroke Risk Factors  Substance abuse - UDS: THC POSITIVEWill advise patientto stop using due to stroke risk.  Other Active Problems  Sick thyroid?- TSH: 0.307 (Low) Free T4:1.27 (high)   Blood Cultures draw 3/25 No growth at 3 days  Respiratory Culture - Strep Pneumoniae on Ceftriaxone 2 g daily  Hospital day # 5  Arna Snipe MD Resident I have personally obtained history,examined this patient, reviewed notes, independently viewed imaging studies, participated in medical decision making and plan of care.ROS completed by me personally and pertinent positives fully documented  I have made any additions or  clarifications directly to the above note. Agree with note above.  Continuing ongoing ventilatory support for respiratory failure as per critical care team.  Hopefully wean off ventilatory support in the next few days when more alert.  Continue ongoing medical management.  As per critical care team.  Discussed with Dr. Isaiah Serge critical care medicine.  No family at the bedside for discussion today This patient is critically ill and at significant risk of neurological worsening, death and care requires constant monitoring of vital signs, hemodynamics,respiratory and cardiac monitoring, extensive review of multiple databases, frequent neurological assessment, discussion with family, other specialists and medical decision making of high complexity.I have made any additions or clarifications directly to the above note.This critical care time does not reflect procedure time, or teaching time or supervisory time of PA/NP/Med Resident etc but could involve care discussion time.  I spent 30 minutes of neurocritical care time  in the care of  this patient.      Delia Heady, MD Medical Director [facility] Pager: [phone] 07/09/2020 4:22 PM   To contact Stroke Continuity provider, please refer to [email or web address]. After hours, contact General Neurology

## 2020-07-10 ENCOUNTER — Encounter (HOSPITAL_COMMUNITY): Payer: Self-pay | Admitting: Neurosurgery

## 2020-07-10 ENCOUNTER — Inpatient Hospital Stay (HOSPITAL_COMMUNITY): Payer: Medicaid Other

## 2020-07-10 DIAGNOSIS — I639 Cerebral infarction, unspecified: Secondary | ICD-10-CM | POA: Diagnosis not present

## 2020-07-10 DIAGNOSIS — I6601 Occlusion and stenosis of right middle cerebral artery: Secondary | ICD-10-CM | POA: Diagnosis not present

## 2020-07-10 LAB — BASIC METABOLIC PANEL
Anion gap: 6 (ref 5–15)
BUN: 7 mg/dL (ref 6–20)
CO2: 32 mmol/L (ref 22–32)
Calcium: 8.2 mg/dL — ABNORMAL LOW (ref 8.9–10.3)
Chloride: 112 mmol/L — ABNORMAL HIGH (ref 98–111)
Creatinine, Ser: 0.49 mg/dL (ref 0.44–1.00)
GFR, Estimated: >60 mL/min (ref >60)
Glucose, Bld: 109 mg/dL — ABNORMAL HIGH (ref 70–99)
Potassium: 3.4 mmol/L — ABNORMAL LOW (ref 3.5–5.1)
Sodium: 150 mmol/L — ABNORMAL HIGH (ref 135–145)

## 2020-07-10 LAB — GLUCOSE, CAPILLARY
Glucose-Capillary: 116 mg/dL — ABNORMAL HIGH (ref 70–99)
Glucose-Capillary: 139 mg/dL — ABNORMAL HIGH (ref 70–99)
Glucose-Capillary: 91 mg/dL (ref 70–99)
Glucose-Capillary: 125 mg/dL — ABNORMAL HIGH (ref 70–99)
Glucose-Capillary: 137 mg/dL — ABNORMAL HIGH (ref 70–99)
Glucose-Capillary: 138 mg/dL — ABNORMAL HIGH (ref 70–99)

## 2020-07-10 LAB — CBC
HCT: 27 % — ABNORMAL LOW (ref 36.0–46.0)
Hemoglobin: 7.1 g/dL — ABNORMAL LOW (ref 12.0–15.0)
MCH: 20.3 pg — ABNORMAL LOW (ref 26.0–34.0)
MCHC: 26.3 g/dL — ABNORMAL LOW (ref 30.0–36.0)
MCV: 77.4 fL — ABNORMAL LOW (ref 80.0–100.0)
Platelets: 237 10*3/uL (ref 150–400)
RBC: 3.49 MIL/uL — ABNORMAL LOW (ref 3.87–5.11)
RDW: 24.5 % — ABNORMAL HIGH (ref 11.5–15.5)
WBC: 8.9 10*3/uL (ref 4.0–10.5)
nRBC: 0.2 % (ref 0.0–0.2)

## 2020-07-10 LAB — SODIUM: Sodium: 151 mmol/L — ABNORMAL HIGH (ref 135–145)

## 2020-07-10 LAB — MAGNESIUM: Magnesium: 1.9 mg/dL (ref 1.7–2.4)

## 2020-07-10 LAB — PHOSPHORUS: Phosphorus: 4.3 mg/dL (ref 2.5–4.6)

## 2020-07-10 NOTE — Evaluation (Signed)
Physical Therapy Evaluation Patient Details Name: Brandi Myers MRN: [id number] DOB: 09/14/1985 Today's Date: 07/10/2020   History of Present Illness  35 yo female admitted with L sided weakness givent TPA for R MCA involving basal ganglia frontal temporal lobes  3/23 s/p BIL revascularization  R MCA ACA achieving 2B revascularization PT intubated starting 3/23 . 3/24 R UE thromectomy CT reveals worsening midline shift start 3% saline 3/25 decompressive hemicranitectomy R side bone flap in abdomen 3/25 fever 103 PMH current smoker uncontrolled DM HTn  Clinical Impression  Pt evaluated s/p R hemicranitectomy and R MCA revascularization. Currently on vent, RN bumped up to 50% FiO2, 5 PEEP to maintain oxygen saturation > 90% and pt required several deep suctions at beginning of session. Pt following right sided commands, but no active movement noted on left side. Pt requiring two person maximal assist for bed mobility. Highly recommend intensive CIR therapies to promote neuro recovery.     Follow Up Recommendations CIR    Equipment Recommendations  Other (comment) (TBA)    Recommendations for Other Services       Precautions / Restrictions Precautions Precautions: Fall Precaution Comments: vent, R wrist restraint bone flap out in R abdomen, L hemiparesis Restrictions Weight Bearing Restrictions: No      Mobility  Bed Mobility Overal bed mobility: Needs Assistance Bed Mobility: Rolling Rolling: +2 for physical assistance;Max assist         General bed mobility comments: Pt rolling for skin checks at this time. pt with chair position used to assess BP and tolerance at this time due to respiratory status. pt desaturation to 82% with RN deep suction and rescue breath x3 to help elevate >90% adjustment to vent settings. Pt tolerates full chair position and visually opening bil eyes in this position. Pt nodding head yes to command    Transfers                 General  transfer comment: NT  Ambulation/Gait                Stairs            Wheelchair Mobility    Modified Rankin (Stroke Patients Only) Modified Rankin (Stroke Patients Only) Pre-Morbid Rankin Score: No symptoms Modified Rankin: Severe disability     Balance Overall balance assessment: Needs assistance Sitting-balance support: Bilateral upper extremity supported   Sitting balance - Comments: pt with lateral lean of trunk with hob elevated and requires repositioning several times. pt pushing with R UE toward L at times. Pt with pillows positioned bil sides to help with midline head and trunk for upright sitting. Pt with increased 02 saturation to 98%                                     Pertinent Vitals/Pain Pain Assessment: Faces Faces Pain Scale: Hurts little more Pain Location: generalized ( especially with deep suction my RN staff) Pain Descriptors / Indicators: Discomfort Pain Intervention(s): Monitored during session;Limited activity within patient's tolerance    Home Living Family/patient expects to be discharged to:: Inpatient rehab                 Additional Comments: no family present / pt intubated impairing communication    Prior Function Level of Independence: Independent               Hand Dominance   Dominant Hand:  Right    Extremity/Trunk Assessment   Upper Extremity Assessment Upper Extremity Assessment: RUE deficits/detail;LUE deficits/detail RUE Deficits / Details: following commands for thumbs up and down, squeeze. moving shoulder > 90 degrees flexion on her own, supination / pronation witnessed, responses to tactile input without deep pressure needed.  Noted to have wound to palm that is consistent size of a cigarette and noted to have old wounds to the surface to the forearm that have healed similar size to that of the palm at this time. RUE Coordination: decreased fine motor LUE Deficits / Details: Flaccid, does  move R UE and LE at the same as painful stimuli during session. LUE Sensation: decreased light touch;decreased proprioception LUE Coordination: decreased fine motor;decreased gross motor    Lower Extremity Assessment Lower Extremity Assessment: LLE deficits/detail;RLE deficits/detail RLE Deficits / Details: actively moving to commands LLE Deficits / Details: flaccid holding in extension with plantarflexion LLE Sensation: decreased light touch;decreased proprioception LLE Coordination: decreased fine motor;decreased gross motor    Cervical / Trunk Assessment Cervical / Trunk Assessment: Other exceptions Cervical / Trunk Exceptions: with L neck rotation preference at this time.  Communication   Communication: Other (comment) (intubated)  Cognition Arousal/Alertness: Awake/alert Behavior During Therapy: Restless Overall Cognitive Status: Difficult to assess                                 General Comments: Follwing R sided commands      General Comments General comments (skin integrity, edema, etc.): Risk for skin break down in L hip area noted to have moisture and wound present that is consistent with the same of the palm. OT placing pillow case for sicking of moist skin at this time. Pt noted to have scars on lower abdomen that match those on R forearm.    Exercises     Assessment/Plan    PT Assessment Patient needs continued PT services  PT Problem List Decreased strength;Decreased activity tolerance;Decreased balance;Decreased mobility;Impaired sensation       PT Treatment Interventions DME instruction;Gait training;Functional mobility training;Therapeutic activities;Therapeutic exercise;Balance training;Patient/family education    PT Goals (Current goals can be found in the Care Plan section)  Acute Rehab PT Goals Patient Stated Goal: none stated (intubated) PT Goal Formulation: Patient unable to participate in goal setting Time For Goal Achievement:  07/24/20 Potential to Achieve Goals: Fair    Frequency Min 4X/week   Barriers to discharge        Co-evaluation PT/OT/SLP Co-Evaluation/Treatment: Yes Reason for Co-Treatment: Complexity of the patient's impairments (multi-system involvement);Necessary to address cognition/behavior during functional activity;For patient/therapist safety;To address functional/ADL transfers PT goals addressed during session: Mobility/safety with mobility OT goals addressed during session: ADL's and self-care;Proper use of Adaptive equipment and DME;Strengthening/ROM       AM-PAC PT "6 Clicks" Mobility  Outcome Measure Help needed turning from your back to your side while in a flat bed without using bedrails?: Total Help needed moving from lying on your back to sitting on the side of a flat bed without using bedrails?: Total Help needed moving to and from a bed to a chair (including a wheelchair)?: Total Help needed standing up from a chair using your arms (e.g., wheelchair or bedside chair)?: Total Help needed to walk in hospital room?: Total Help needed climbing 3-5 steps with a railing? : Total 6 Click Score: 6    End of Session Equipment Utilized During Treatment: Oxygen Activity Tolerance: Other (comment) (  limited by oxygen desaturation) Patient left: in bed;with call bell/phone within reach;with restraints reapplied Nurse Communication: Mobility status PT Visit Diagnosis: Other abnormalities of gait and mobility (R26.89);Hemiplegia and hemiparesis;Other symptoms and signs involving the nervous system (R29.898) Hemiplegia - Right/Left: Left Hemiplegia - dominant/non-dominant: Non-dominant Hemiplegia - caused by: Cerebral infarction    Time: 1031-5945 PT Time Calculation (min) (ACUTE ONLY): 31 min   Charges:   PT Evaluation $PT Eval High Complexity: 1 High          Lillia Pauls, PT, DPT Acute Rehabilitation Services Pager [phone] Office [phone]   Brandi Myers 07/10/2020, 12:50 PM

## 2020-07-10 NOTE — Progress Notes (Addendum)
  Progress Note    07/10/2020 7:19 AM 4 Days Post-Op  Subjective: intubated, moves right upper and lower extremities spontaneously   Vitals:   07/10/20 0500 07/10/20 0600  BP: (!) 144/61 (!) 151/63  Pulse: 95 85  Resp: 16 17  Temp:    SpO2: 99% 100%   Physical Exam: Cardiac: regular Lungs: intubated Incisions:  Right UE AC incision intact and healing well. Steri strips in place. Removed Gauze wrap Extremities:  Doppler radial and ulnar signals. Palpable brachial pulse. Right hand warm   CBC    Component Value Date/Time   WBC 8.9 07/10/2020 0621   RBC 3.49 (L) 07/10/2020 0621   HGB 7.1 (L) 07/10/2020 0621   HGB 11.4 (L) 10/22/2012 1800   HCT 27.0 (L) 07/10/2020 0621   HCT 31.4 (L) 10/23/2012 0518   PLT 237 07/10/2020 0621   PLT 191 10/22/2012 1800   MCV 77.4 (L) 07/10/2020 0621   MCV 87 10/22/2012 1800   MCH 20.3 (L) 07/10/2020 0621   MCHC 26.3 (L) 07/10/2020 0621   RDW 24.5 (H) 07/10/2020 0621   RDW 14.3 10/22/2012 1800   LYMPHSABS 1.4 07/05/2020 0541   LYMPHSABS 2.4 10/22/2012 1800   MONOABS 0.1 07/05/2020 0541   MONOABS 1.0 (H) 10/22/2012 1800   EOSABS 0.1 07/05/2020 0541   EOSABS 0.1 10/22/2012 1800   BASOSABS 0.0 07/05/2020 0541   BASOSABS 0.1 10/22/2012 1800    BMET    Component Value Date/Time   NA 150 (H) 07/10/2020 0621   K 3.4 (L) 07/10/2020 0621   CL 112 (H) 07/10/2020 0621   CO2 32 07/10/2020 0621   GLUCOSE 109 (H) 07/10/2020 0621   BUN 7 07/10/2020 0621   CREATININE 0.49 07/10/2020 0621   CALCIUM 8.2 (L) 07/10/2020 0621   GFRNONAA >60 07/10/2020 0621   GFRAA >60 01/04/2018 2056    INR    Component Value Date/Time   INR 1.2 07/04/2020 1337     Intake/Output Summary (Last 24 hours) at 07/10/2020 0719 Last data filed at 07/10/2020 0600 Gross per 24 hour  Intake 3010.33 ml  Output 1300 ml  Net 1710.33 ml     Assessment/Plan:  35 y.o. female is s/p right brachial embolectomy 4 Days Post-Op. RUE remains well perfused with doppler  signals. Able to move right side spontaneously. AC incision is intact and healing well. Do not need to continue to wrap it at this point.  Vascular available if needed   Dory Horn [facility] [phone] 07/10/2020 7:19 AM   I have seen and evaluated the patient. I agree with the PA note as documented above.  Status post right upper extremity thrombectomy on Thursday for ischemic arm.  Incision looks good.  Brisk radial and ulnar signals at the wrist.  Hand appears well perfused.  Unable to get anticoagulation and now status post decompressive hemicraniectomy with Dr. Venetia Maxon.  Cephus Shelling, MD [facility] [place] Office: [phone]

## 2020-07-10 NOTE — Progress Notes (Addendum)
NAME:  Brandi Myers, MRN:  [id number], DOB:  Jun 24, 1985, LOS: 6 ADMISSION DATE:  07/04/2020, CONSULTATION DATE:  07/04/2020 REFERRING MD:  Dr. Corliss Skains, CHIEF COMPLAINT:  Vent management    History of Present Illness:  Brandi Myers is a 35 y.o. with PMH significant for HTN and type 2 diabetes who presented as a code stroke due to left sided weakness and slurred speech. Head CT demonstrated right ICA occlusion as well as right common carotid bifurcation thrombus resulting in acute stroke. Patient received TPA in ED at [facility]. Transferred to [facility] where she underwent revascularization of occluded terminal ICA and right MCA and right ACA. Patient remained intubated and was transferred to neuro ICU. Critical care consulted for ventilator management   Pertinent  Medical History   Uncontrolled diabetes and hypertension  Significant Hospital Events: Including procedures, antibiotic start and stop dates in addition to other pertinent events   .  3/23  Admitted as a code stroke and found to have occluded right ICA resulting in stroke.  Underwent IR revascularization and remained intubated post procedure . 3/24 R brachial clot requiring thrombectomy, hemorrhagic transformation on MRI and worsening midline shift on imaging . 3/25 NSGY decompressive craniectomy, TF started . 3/26 fevers post op, holding abx, SBT/SAT . 3/27 Tracheal aspirate with S.aureus and strep pneumo- started CTX and vanc . 3/28- Stopped vanc  Interim History / Subjective:  Had desaturation events this morning during PT. On vent, has not yet weaned. Continues to have poor mental status. Sugars in goal range, Na down to 150.   Objective   Blood pressure (!) 150/57, pulse 90, temperature 99.3 F (37.4 C), temperature source Axillary, resp. rate 16, height 5\' 5"  (1.651 m), weight 80.4 kg, SpO2 97 %.    Vent Mode: PRVC FiO2 (%):  [30 %-40 %] 40 % Set Rate:  [16 bmp] 16 bmp Vt Set:  [450 mL] 450 mL PEEP:  [5 cmH20] 5  cmH20 Plateau Pressure:  [14 cmH20-16 cmH20] 16 cmH20   Intake/Output Summary (Last 24 hours) at 07/10/2020 0841 Last data filed at 07/10/2020 0700 Gross per 24 hour  Intake 3040.31 ml  Output 1300 ml  Net 1740.31 ml   Filed Weights   07/04/20 1730 07/06/20 2200 07/08/20 0310  Weight: 72.2 kg 72.3 kg 80.4 kg    Examination: Constitutional: ill appearing woman on vent  Eyes: eyes are swollen shut Ears, nose, mouth, and throat: ETT in place, small thick purulent secretions Cardiovascular: RRR, ext warm Respiratory: CTAB Gastrointestinal: soft, +BS Skin: No rashes, normal turgor. Cranial and abdominal dressings are clean dry intact. 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 aureus and strep pnemo isolated on tracheal aspirate on 3/27 - Continue CTX 2g daily (day 3 today), off vanc since 3/28  Uncontrolled type 2 diabetes - Glucose improved - Continue levemir + TF coverage; goal CBG 100-180  Low Hgb - Improved s/p 1u RBCs on 3/27 -  CTM   R limb critical limb ischemia - S/p thrombectomy, site looks good  Low TSH, mildly elevated t4 - OP repeat labs once drugs out of system  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, hx polysubstance abuse Tobacco abuse  Best practice (evaluated daily)  Diet: Cortrak Pain/Anxiety/Delirium protocol (if indicated): scheduled tylenol, fentanyl, PRNs VAP protocol (if indicated): Yes DVT prophylaxis: SCD GI  prophylaxis: PPI Glucose control:  Basal + SSI Central venous access:  N/A Arterial line:  N/A Foley:  No  Mobility:  bed rest  PT consulted: Yes Last date of multidisciplinary goals of care discussion: per primary Code Status:  full code Disposition: ICU  I personally spent 30 minutes providing critical care not including any separately billable procedures  [name], MS4 [facility]  Prefer epic messenger for cross cover needs If after hours, please call E-link  Attending note: I have seen and examined the patient. History, labs and imaging reviewed.  35 year old with hypertension, diabetes presenting with right ICA occlusion and stroke TPA and revascularization.  Developed significant brain edema requiring decompressive craniectomy on 3/25 Desats while working with PT, moving rt side, more awake today  Blood pressure (!) 150/57, pulse 90, temperature 99.3 F (37.4 C), temperature source Axillary, resp. rate 16, height 5\' 5"  (1.651 m), weight 80.4 kg, SpO2 97 %. Gen:      No acute distress HEENT:  EOMI, sclera anicteric Neck:     No masses; no thyromegaly, ETT Lungs:    Clear to auscultation bilaterally; normal respiratory effort CV:         Regular rate and rhythm; no murmurs Abd:      + bowel sounds; soft, non-tender; no palpable masses, no distension Ext:    No edema; adequate peripheral perfusion Skin:      Warm and dry; no rash Neuro: Sedated, unresponsive  Labs/Imaging personally reviewed, significant for Sodium 150, potassium 3.4, hemoglobin 7.1 No new imaging  Assessment/plan: Right ICA stroke with edema Status post revascularization, craniotomy Monitor Na off 3% saline. Off arctic sun, no more fevers  Acute respiratory failure Staph aureus, pneumococcus pneumonia Continue ceftriaxone, off vanco Off vancomycin  Diabetes On lantus and SSI  The patient is critically ill with multiple organ systems failure and requires high complexity  decision making for assessment and support, frequent evaluation and titration of therapies, application of advanced monitoring technologies and extensive interpretation of multiple databases.  Critical care time - 35 mins. This represents my time independent of the NPs time taking care of the pt.  MD [facility] 07/10/2020, 9:50 AM

## 2020-07-10 NOTE — Progress Notes (Incomplete)
Attending note: I have seen and examined the patient. History, labs and imaging reviewed.  35 year old with hypertension, diabetes presenting with right ICA occlusion and stroke TPA and revascularization.  Developed significant brain edema requiring decompressive craniectomy on 3/25 Desats while working with PT, moving rt side, more awake today  Blood pressure (!) 150/57, pulse 90, temperature 99.3 F (37.4 C), temperature source Axillary, resp. rate 16, height 5\' 5"  (1.651 m), weight 80.4 kg, SpO2 97 %. Gen:      No acute distress HEENT:  EOMI, sclera anicteric Neck:     No masses; no thyromegaly Lungs:    Clear to auscultation bilaterally; normal respiratory effort*** CV:         Regular rate and rhythm; no murmurs Abd:      + bowel sounds; soft, non-tender; no palpable masses, no distension Ext:    No edema; adequate peripheral perfusion Skin:      Warm and dry; no rash Neuro: alert and oriented x 3 Psych: normal mood and affect   Labs/Imaging personally reviewed, significant for   Assessment/plan: Right ICA stroke with edema Status post revascularization, craniotomy Monitor Na off 3% saline. Off arctic sun, no more fevers  Acute respiratory failure Staph aureus, pneumococcus pneumonia Continue ceftriaxone, off vanco Off vancomycin  Diabetes On lantus and SSI  The patient is critically ill with multiple organ systems failure and requires high complexity decision making for assessment and support, frequent evaluation and titration of therapies, application of advanced monitoring technologies and extensive interpretation of multiple databases.  Critical care time - 35 mins. This represents my time independent of the NPs time taking care of the pt.  MD [facility] 07/10/2020, 9:50 AM   Best Practice (right click and "Reselect all SmartList Selections" daily)   Diet:  {Diet:26932} If oral type of diet: *** Pain/Anxiety/Delirium  protocol (if indicated): Yes {Rass Score:25338} VAP protocol (if indicated): {VAP:29640} DVT prophylaxis: {DVT Prophylaxis:26933} GI prophylaxis: {GI:26934} Glucose control:  {Glucose Control:26935} Central venous access:  {Central Venous Access:26936} Arterial line:  {Central Venous Access:26936} Foley:  {Central Venous Access:26936} Restraints {ACTION; IS/IS NOT:21021397:::1} Restraint type: {RESTRAINT TYPES:220009} {RESTRAINT REASON:220010} <--(delete this if not restrained) Mobility:  {Mobility:26937}  PT consulted: {PT Consult:26938} Blood pressure target: {ACTIONS; HAS/DOES NOT HAVE:19233} <--delete if not needed Target goal if applicable: ***<--delete if not needed Studies pending: {imaging; generic:304450004} Culture data pending: {Pending Results:3041603} Last reviewed culture data:{Time; today/yesterday/ 2 days ago:19188} Antibiotics:{abx:18259}  Antibiotic de-escalation:  {not indicated/requested/declined:14582}  Stop date: {ACTIONS; HAS/DOES NOT HAVE:19233}*** Daily labs: {not indicated/requested/declined:14582} Code Status:  {Code Status:26939} Prognosis: {Palliative prognosis:23489} Last date of multidisciplinary goals of care discussion [***] {Icu pulm disposition:31578:::1}

## 2020-07-10 NOTE — Evaluation (Addendum)
Occupational Therapy Evaluation Patient Details Name: Brandi Myers MRN: [id number] DOB: [birth year] Today's Date: 07/10/2020    History of Present Illness 35 yo female admitted with L sided weakness givent TPA for R MCA involving basal ganglia frontal temporal lobes  3/23 s/p BIL revascularization  R MCA ACA achieving 2B revascularization PT intubated starting 3/23 . 3/24 R UE thromectomy CT reveals worsening midline shift start 3% saline 3/25 decompressive hemicranitectomy R side bone flap in abdomen 3/25 fever 103 PMH current smoker uncontrolled DM HTn   Clinical Impression   Patient is s/p R hemicranitectomy surgery  Revascularization R MCA resulting in functional limitations due to the deficits listed below (see OT problem list). Pt currently requires total +2 max (A) for bed mobility and only moving R side of the body. Pt baseline indep from chart review. Pt with limited ability to open R eye due to swelling from R crani.  Patient will benefit from skilled OT acutely to increase independence and safety with ADLS to allow discharge CIR.     Follow Up Recommendations  CIR    Equipment Recommendations  3 in 1 bedside commode;Wheelchair (measurements OT);Wheelchair cushion (measurements OT);Hospital bed    Recommendations for Other Services Rehab consult;Speech consult     Precautions / Restrictions Precautions Precautions: Fall Precaution Comments: vent/ R wrist restraint bone flap out in R abdomen      Mobility Bed Mobility Overal bed mobility: Needs Assistance Bed Mobility: Rolling Rolling: +2 for physical assistance;Max assist         General bed mobility comments: Pt rollign for skin checks at this time. pt with chair position used to assess BP and tolerance at this time due to respiratory status. pt desaturation to 82% with RN deep suction and rescue breath x3 to help elevate >90% adjustment to vent settings. Pt tolerates full chair position and visually opening bil  eyes in this position. Pt nodding head yes to comfort comfort.    Transfers                 General transfer comment: NT    Balance Overall balance assessment: Needs assistance Sitting-balance support: Bilateral upper extremity supported   Sitting balance - Comments: pt with lateral lean of trunk with hob elevated and requires repositioning several times. pt pushing with R UE toward L at times. Pt with pillows positioned bil sides to help with midline head and trunk for upright sitting. Pt with increased 02 saturation to 98%                                   ADL either performed or assessed with clinical judgement   ADL Overall ADL's : Needs assistance/impaired Eating/Feeding: NPO                                     General ADL Comments: total (A) for all adls     Vision   Vision Assessment?: Vision impaired- to be further tested in functional context Additional Comments: R eye completely swollen closed. Pt with L eye drainage and with clearing eye with wet wash cloth opening during session . pt opening bil eyes briefly with HOB fully elevated. pt with R eye deviation noted during session     Perception     Praxis      Pertinent Vitals/Pain Pain Assessment: Faces  Faces Pain Scale: Hurts little more Pain Location: generalized ( especially with deep suction my RN staff) Pain Descriptors / Indicators: Discomfort Pain Intervention(s): Monitored during session;Repositioned     Hand Dominance Right   Extremity/Trunk Assessment Upper Extremity Assessment Upper Extremity Assessment: RUE deficits/detail;LUE deficits/detail RUE Deficits / Details: following commands for thumbs up and down, squeeze. moving shoulder > 90 degrees flexion on her own, supination / pronation witnessed, responses to tactile input without deep pressure needed.  Noted to have wound to palm that is consistent size of a cigarette and noted to have old wounds to the  surface to the forearm that have healed similar size to that of the palm at this time. RUE Coordination: decreased fine motor LUE Deficits / Details: Flaccid, does move R UE and LE at the same as painful stimuli during session. LUE Sensation: decreased light touch;decreased proprioception LUE Coordination: decreased fine motor;decreased gross motor   Lower Extremity Assessment Lower Extremity Assessment: Defer to PT evaluation;RLE deficits/detail;LLE deficits/detail RLE Deficits / Details: actively moving to commands LLE Deficits / Details: flaccid holding in extension with plantarflexion LLE Sensation: decreased light touch;decreased proprioception LLE Coordination: decreased fine motor;decreased gross motor   Cervical / Trunk Assessment Cervical / Trunk Assessment: Other exceptions Cervical / Trunk Exceptions: with L neck rotation preference at this time.   Communication Communication Communication: Other (comment) (intubated)   Cognition Arousal/Alertness: Awake/alert Behavior During Therapy: Restless Overall Cognitive Status: Difficult to assess                                     General Comments  risk for skin break down in L hip area noted to have moisture and wound present that is consistent with the same of the palm. OT placing a pillow case for wicking of moist skin at this time. Pt noted to have scars on lower abdomen that match those on the back of the R forearm    Exercises     Shoulder Instructions      Home Living Family/patient expects to be discharged to:: Inpatient rehab                                 Additional Comments: no family present / pt intubated impairing communication      Prior Functioning/Environment Level of Independence: Independent                 OT Problem List: Decreased strength;Decreased activity tolerance;Impaired balance (sitting and/or standing);Impaired vision/perception;Decreased  coordination;Decreased cognition;Decreased safety awareness;Decreased range of motion;Decreased knowledge of use of DME or AE;Decreased knowledge of precautions;Cardiopulmonary status limiting activity;Impaired sensation;Impaired tone;Impaired UE functional use;Pain;Increased edema      OT Treatment/Interventions: Self-care/ADL training;Therapeutic exercise;Neuromuscular education;DME and/or AE instruction;Manual therapy;Modalities;Splinting;Cognitive remediation/compensation;Therapeutic activities;Patient/family education;Balance training;Visual/perceptual remediation/compensation    OT Goals(Current goals can be found in the care plan section) Acute Rehab OT Goals Patient Stated Goal: none stated (intubated) OT Goal Formulation: Patient unable to participate in goal setting Potential to Achieve Goals: Good  OT Frequency: Min 2X/week   Barriers to D/C: Other (comment) (unknown at this time)          Co-evaluation PT/OT/SLP Co-Evaluation/Treatment: Yes Reason for Co-Treatment: Complexity of the patient's impairments (multi-system involvement);For patient/therapist safety;Necessary to address cognition/behavior during functional activity;To address functional/ADL transfers   OT goals addressed during session: ADL's and self-care;Proper use of Adaptive equipment  and DME;Strengthening/ROM      AM-PAC OT "6 Clicks" Daily Activity     Outcome Measure Help from another person eating meals?: Total Help from another person taking care of personal grooming?: Total Help from another person toileting, which includes using toliet, bedpan, or urinal?: Total Help from another person bathing (including washing, rinsing, drying)?: Total Help from another person to put on and taking off regular upper body clothing?: Total Help from another person to put on and taking off regular lower body clothing?: Total 6 Click Score: 6   End of Session Equipment Utilized During Treatment: Oxygen Nurse  Communication: Mobility status;Precautions;Weight bearing status  Activity Tolerance: Patient tolerated treatment well Patient left: in bed;with call bell/phone within reach;with bed alarm set;with restraints reapplied;with SCD's reapplied  OT Visit Diagnosis: Unsteadiness on feet (R26.81);Muscle weakness (generalized) (M62.81);Hemiplegia and hemiparesis Hemiplegia - Right/Left: Left Hemiplegia - dominant/non-dominant: Non-Dominant Hemiplegia - caused by: Cerebral infarction                Time: 9767-3419 OT Time Calculation (min): 31 min Charges:  OT General Charges $OT Visit: 1 Visit OT Evaluation $OT Eval High Complexity: 1 High   Brynn, OTR/L  Acute Rehabilitation Services Pager: [phone] Office: [phone] .   Mateo Flow 07/10/2020, 9:57 AM

## 2020-07-10 NOTE — Progress Notes (Signed)
Subjective: Patient reports that she is unresponsive and unable to follow commands. She remains on the ventilator and on sedation. No acute events overnight.   Objective: Vital signs in last 24 hours: Temp:  [98.5 F (36.9 C)-99.4 F (37.4 C)] 99.4 F (37.4 C) (03/29 0400) Pulse Rate:  [74-113] 85 (03/29 0600) Resp:  [15-24] 17 (03/29 0600) BP: (142-166)/(50-88) 151/63 (03/29 0600) SpO2:  [97 %-100 %] 100 % (03/29 0600) FiO2 (%):  [30 %] 30 % (03/29 0400)  Intake/Output from previous day: 03/28 0701 - 03/29 0700 In: 3010.3 [I.V.:710.3; NG/GT:2000; IV Piggyback:300] Out: 1300 [Urine:1300] Intake/Output this shift: No intake/output data recorded.  Physical Exam: Patient is unresponsive and unable to follow commands. PERRL. Patient with movent on the right side to noxious stimuli. No movement on the left.  Postive Babinski's reflex on the left. Cranial and abdominal dressings are CDI. Incisions are well approximated with no drainage, erythema, or edema.  Lab Results: Recent Labs    07/09/20 0553 07/10/20 0621  WBC 11.1* 8.9  HGB 7.7* 7.1*  HCT 30.0* 27.0*  PLT 200 237   BMET Recent Labs    07/09/20 0553 07/09/20 1319 07/09/20 2300 07/10/20 0621  NA 156*   < > 151* 150*  K 3.4*  --   --  3.4*  CL 118*  --   --  112*  CO2 31  --   --  32  GLUCOSE 160*  --   --  109*  BUN 9  --   --  7  CREATININE 0.49  --   --  0.49  CALCIUM 8.7*  --   --  8.2*   < > = values in this interval not displayed.    Studies/Results: No results found.  Assessment/Plan: Patient continues to be neurologically stable. No new neurosurgical recommendations. Continue supportive care. Further management per stroke team. Call with any questions.   LOS: 6 days     Council Mechanic, DNP, NP-C 07/10/2020, 7:47 AM

## 2020-07-10 NOTE — Progress Notes (Signed)
TCD bubble study has been completed.   Preliminary results in CV Proc.   Blanch Media 07/10/2020 2:05 PM

## 2020-07-10 NOTE — Progress Notes (Addendum)
STROKE TEAM PROGRESS NOTE   INTERVAL HISTORY No acute events overnight.  She is more alert today. Able to follow commands. Still has no movement on her Left side. She remains intubated for respiratory failure and attempts at weaning yesterday were not successful NeuroSurgery, Vascular Surgery, and CCM are following.  Vitals:   07/10/20 0400 07/10/20 0500 07/10/20 0600 07/10/20 0700  BP: (!) 162/78 (!) 144/61 (!) 151/63 (!) 150/57  Pulse: 93 95 85 90  Resp: 16 16 17 16   Temp: 99.4 F (37.4 C)     TempSrc: Axillary     SpO2: 100% 99% 100% 99%  Weight:      Height:       CBC:  Recent Labs  Lab 07/04/20 1247 07/04/20 1844 07/05/20 0541 07/06/20 0240 07/09/20 0553 07/10/20 0621  WBC 17.8*  --  14.2*   < > 11.1* 8.9  NEUTROABS 12.7*  --  12.5*  --   --   --   HGB 9.5*   < > 8.6*   < > 7.7* 7.1*  HCT 35.0*   < > 32.0*   < > 30.0* 27.0*  MCV 67.2*  --  69.3*   < > 78.7* 77.4*  PLT 235  --  160   < > 200 237   < > = values in this interval not displayed.   Basic Metabolic Panel:  Recent Labs  Lab 07/09/20 0553 07/09/20 1319 07/09/20 2300 07/10/20 0621  NA 156*   < > 151* 150*  K 3.4*  --   --  3.4*  CL 118*  --   --  112*  CO2 31  --   --  32  GLUCOSE 160*  --   --  109*  BUN 9  --   --  7  CREATININE 0.49  --   --  0.49  CALCIUM 8.7*  --   --  8.2*  MG 1.9  --   --  1.9  PHOS 4.3  --   --  4.3   < > = values in this interval not displayed.   Lipid Panel:  Recent Labs  Lab 07/05/20 0541 07/08/20 0500  CHOL 154  --   TRIG 79  86 82  HDL 39*  --   CHOLHDL 3.9  --   VLDL 16  --   LDLCALC 99  --    HgbA1c:  Recent Labs  Lab 07/04/20 1814  HGBA1C 10.6*   Urine Drug Screen:  Recent Labs  Lab 07/05/20 0010  LABOPIA NONE DETECTED  COCAINSCRNUR POSITIVE*  LABBENZ NONE DETECTED  AMPHETMU NONE DETECTED  THCU POSITIVE*  LABBARB NONE DETECTED    Alcohol Level  Recent Labs  Lab 07/04/20 1247  ETH <10    IMAGING past 24 hours No results  found.  PHYSICAL EXAM Blood pressure (!) 150/57, pulse 90, temperature 99.4 F (37.4 C), temperature source Axillary, resp. rate 16, height 5\' 5"  (1.651 m), weight 80.4 kg, SpO2 99 %.  General: drowsy and intubated but arousable, caucasian female, no apparent distress right scalp and bilateral eyelid swelling from surgery which has decreased since yesterday  Lungs: Symmetrical Chest rise, no labored breathing  Cardio: Regular Rate and Rhythm  Abdomen: Soft, non-tender  Neuro: drowsy and intubated but arousable. Able to follow some commands. Cranial Nerves: II:  Visual fields grossly normal, pupils equal, round, reactive to light and accommodation III,IV, VI: ptosis not present, extra-ocular motions intact bilaterally V,VII: smile symmetric, facial light touch sensation  normal bilaterally VIII: hearing normal bilaterally IX,X: uvula rises symmetrically XI: bilateral shoulder shrug XII: midline tongue extension without atrophy or fasciculations  Motor: Right : Upper extremity   4/5    Left:     Upper extremity   0/5  Lower extremity   5/5     Lower extremity   0/5 Tone and bulk:normal tone throughout; no atrophy noted    ASSESSMENT/PLAN Brandi Myers is a 35 y.o. female with history of uncontrolled diabetesandhypertensionpresenting with left sided weakness and slurred speech.Per family there is a complicated social situation at this time. She lives intermittently in an apartment by herself but was kicked out and they went to pick her up at 5:30 AM. At that point she seemed "off"and generally quiet but did not seem to have any difficulty with left-sided weakness.Family attributed this to psychosocial issues and potential substance use noting that her sister passed away from an overdose in October. Later in the morning she complained of diffuse body pain and was administered gabapentin and a muscle relaxer.At 11:30 AM she stood up and walkedto the showerand at1215 she  was found on the bottom of the tub with slurred speech and difficulty moving the left side. She was not preactivated by EMS, but on arrival in the ED clearly appear to have a doorway examination consistent with a right MCA territory infarct.CTA revealed right ICA occlusion as well as a right common carotid bifurcation thrombus extending into the external carotid (felt to be the source of the occlusion). CT perfusion imaging demonstrated possible core infarct of 51 mL (though notably this was obtained within 2 hours of symptom onset and therefore may reflect an overestimate of the true core). Mismatch ratio was 2 with a penumbra of 63 mL. Case was discussed with Dr. Corliss Skains of neuro interventional radiology and Dr. Otelia Limes of [facility] and the patient was accepted for transfer to [facility] for emergent intervention. She was also started on TPA after consenting the patient's emergency contact with full discussion of risks of bleeding and importance of accurately determining the patient's last known well.[name] confirm the last known well as described above and agreed that the patient would want TPA given the risk/benefit.She confirmed that the patient had no recent procedures or other contraindications to TPA.Regarding her baseline, she is functional at baseline and family believes she is smoking at least1pack a day currently. She is on Metformin for her diabetes, does not take oral contraceptive pills, has had no recent surgeries or procedures, has not had any complaints of bleeding and family believes that she would have informed them of any significant bleeding.   She is being followed by Vascular surgery and Neurosurgery, will await further recommendations. Respiratory culture grew Strep Pneumo, still on Ceftriaxone. Blood culture no growth at 4 days. Given her increase in arousal CCM will attempt to wean her off the ventilator today. Will order [place] Transcranial with bubble study today  to check for PFO.   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   CTA head and neck showed right ICA terminal and proximal MCA occlusion, right CCA nonocclusive thrombus.  CT perfusion showed large penumbra.   Status post thrombectomy with right terminal ICA, right PCA and right ACA TICI2breperfusion however does have distal  micro emboli.  MRI showed large right MCA infarct with 6mm MLS and hemorrhagic transformation.   MRA showed patent intracranial vessels.  CT Head WO Contrast 3/26 620 - Improved midline shift and normalized left lateral ventricular volume after decompressive craniectomy. No progressive infarct or hemorrhage.  2D Echo- EF: 60-65%. No wall motion abnormality.  Consider TEE once stable  DVT US -No evidence of DVT bilaterally  LDL99  HgbA1c10.6  VTE prophylaxis -SCD's  No antithromboticprior to admission, now on No antithromboticdue to Hemorrhagic conversion and S/P Hemicraniotomy  Therapy recommendations:Pending  Disposition:Pending  Cerebral edema  CT Headrepeat3/24 -Continued interval evolution of right MCA distribution infarct with associated hemorrhagic transformation. Worsened regional mass effect and edema with increased right-to-left midline shift now measuring up to 9 mm.Increased dilatation of the right lateral ventricle concerning for developing ventricular trapping. No other new acute intracranial abnormality.  CT repeat 3/25 unchangedright cerebral cytotoxic edema with central hemorrhage. Midline shift is similar at 1 cm.Unchanged left lateral ventricle dilatation with evidence of transependymal flow.  S/pdecompressive hemicraniectomywith Dr. Venetia MaxonStern  CT  Head WO Contrast 3/26 620 - Improved midline shift and normalized left lateral ventricular volume after decompressive craniectomy. No progressive infarct or hemorrhage.  On keppra  3% salineoff  S/p PICC line  Na goal 150-155  Na 137->141->147->150>158>150   Fever, central origin?  Tmax 101.7->102.5> 98.6  WBC - 8.9  Consider TEE once stable to rule out endocarditis  UA neg  CXR-Interval placement of a left upper extremity PICC with tip overlying the superior cavoatrial junction. No visible pneumothorax. Interval advancement of the endotracheal tube with tip now overlying the distal thoracic trachea.  Respiratory Culture - Strep Pneumoniae  B CxNo growth at 4 days  On [place]  On Ancef  Respiratory failure  Intubated on vent  CCM on board  CXR-Interval placement of a left upper extremity PICC with tip overlying the superior cavoatrial junction. No visible pneumothorax. Interval advancement of the endotracheal tube with tip now overlying the distal thoracic trachea.  Respiratory Culture - Strep Pneumoniae  Hyperlipidemia  Home meds:none  LDL 99, goal < 70  On lipitor 40 now  Continue statin at discharge  Diabetes type II Uncontrolled  Home meds:Metformin, Glargine, Aspart  HgbA1c 10.6, goal < 7.0  CBGs  SSI  Cocaine abuse  UDS positive for cocaine  Cessation education will be provided  Tobacco abuse  Current smoker  Smoking cessation counselingwill beprovided  Other Stroke Risk Factors  Substance abuse - UDS: THC POSITIVEWill advise patientto stop using due to stroke risk.  Other Active Problems  Sick thyroid?- TSH: 0.307 (Low) Free T4:1.27 (high)   Blood Cultures draw 3/25 No growth at 4 days  Respiratory Culture - Strep Pneumoniae on Ceftriaxone 2 g daily    Hospital day # 6  Arna SnipeAlex Pashayan MD Resident I have personally obtained history,examined this patient, reviewed notes, independently viewed  imaging studies, participated in medical decision making and plan of care.ROS completed by me personally and pertinent positives fully documented  I have made any additions or clarifications directly to the above note. Agree with note above.  Patient remains intubated for respiratory failure and attempt to wean have not been successful.  We will probably give her a few more days to see if she can tolerate extubation otherwise may need tracheostomy next week.  Discontinue hypertonic saline and let serum sodium gradually come down by itself and will not replace with free water.  Check TCD bubble study at the bedside  to look for PFO today.  No family available at the bedside for discussion.  Discussed with Dr. Isaiah Serge critical care medicine. This patient is critically ill and at significant risk of neurological worsening, death and care requires constant monitoring of vital signs, hemodynamics,respiratory and cardiac monitoring, extensive review of multiple databases, frequent neurological assessment, discussion with family, other specialists and medical decision making of high complexity.I have made any additions or clarifications directly to the above note.This critical care time does not reflect procedure time, or teaching time or supervisory time of PA/NP/Med Resident etc but could involve care discussion time.  I spent 35 minutes of neurocritical care time  in the care of  this patient.     Delia Heady, MD Medical Director [facility] Pager: [phone] 07/10/2020 3:06 PM   To contact Stroke Continuity provider, please refer to [email or web address]. After hours, contact General Neurology

## 2020-07-10 NOTE — Progress Notes (Signed)
Inpatient Rehab Admissions Coordinator Note:   Per therapy recommendations, pt was screened for CIR candidacy by Estill Dooms, PT, DPT.  At this time note pt on the vent.  No consult at this time.  Will follow from a distance for tolerance post extubation.  Please contact me with questions.   Estill Dooms, PT, DPT [phone] 07/10/20 1:00 PM

## 2020-07-11 DIAGNOSIS — I6601 Occlusion and stenosis of right middle cerebral artery: Secondary | ICD-10-CM | POA: Diagnosis not present

## 2020-07-11 LAB — GLUCOSE, CAPILLARY
Glucose-Capillary: 121 mg/dL — ABNORMAL HIGH (ref 70–99)
Glucose-Capillary: 131 mg/dL — ABNORMAL HIGH (ref 70–99)
Glucose-Capillary: 115 mg/dL — ABNORMAL HIGH (ref 70–99)
Glucose-Capillary: 206 mg/dL — ABNORMAL HIGH (ref 70–99)
Glucose-Capillary: 137 mg/dL — ABNORMAL HIGH (ref 70–99)
Glucose-Capillary: 193 mg/dL — ABNORMAL HIGH (ref 70–99)

## 2020-07-11 LAB — CULTURE, BLOOD (ROUTINE X 2)
Culture: NO GROWTH
Culture: NO GROWTH

## 2020-07-11 MED ORDER — ASPIRIN 81 MG PO CHEW: 81.0000 mg | CHEWABLE_TABLET | Freq: Once | ORAL | Status: DC

## 2020-07-11 MED ORDER — ENOXAPARIN SODIUM 40 MG/0.4ML ~~LOC~~ SOLN
40.0000 mg | Freq: Every day | SUBCUTANEOUS | Status: DC
  Administered 2020-07-11 – 2020-07-15 (×5): 40 mg via SUBCUTANEOUS
  Filled 2020-07-11 (×4): qty 0.4

## 2020-07-11 MED ORDER — ASPIRIN 81 MG PO CHEW
81.0000 mg | CHEWABLE_TABLET | Freq: Every day | ORAL | Status: DC
  Administered 2020-07-11 – 2020-07-15 (×5): 81 mg
  Filled 2020-07-11 (×5): qty 1

## 2020-07-11 MED ORDER — ASPIRIN EC 81 MG PO TBEC
81.0000 mg | DELAYED_RELEASE_TABLET | Freq: Every day | ORAL | Status: DC
  Filled 2020-07-11: qty 1

## 2020-07-11 NOTE — Progress Notes (Signed)
SLP Cancellation Note  Patient Details Name: Brandi Myers MRN: [id number] DOB: February 04, 1986   Cancelled treatment:       Reason Eval/Treat Not Completed: Other (comment). Ventilated but arousal improving per notes. Will follow   Johnanthony Wilden, Riley Nearing 07/11/2020, 10:10 AM

## 2020-07-11 NOTE — Progress Notes (Addendum)
Nutrition Follow-up  DOCUMENTATION CODES:   Not applicable  INTERVENTION:   Tube feeding via Cortrak tube: Glucerna 1.5 at 50 ml/h (1200 ml per day)  Provides 1800 kcal, 99 gm protein, 911 ml free water daily  MVI with minerals   200 ml free water every 6 hours Total free water: 1711 ml   NUTRITION DIAGNOSIS:   Inadequate oral intake related to inability to eat as evidenced by NPO status. Ongoing.   GOAL:   Patient will meet greater than or equal to 90% of their needs Met with TF.   MONITOR:   TF tolerance,Vent status  REASON FOR ASSESSMENT:   Consult Enteral/tube feeding initiation and management,Assessment of nutrition requirement/status  ASSESSMENT:   Pt with PMH of HTN and DM who admitted with L sided weakness and slurred speech per CT pt with R CVA.    Pt discussed during ICU rounds and with RN.   NFPE completed, no signs of fat or muscle depletion noted. Per RN pt following some commands on right side and weaning today.   3/23 s/p tPA and IR 3/24 hemorrhagic transformation, RUE thrombectomy  3/25 s/p R crani with placement of bone flap in R abd 3/28 cortrak placed; tip gastric  3/29 off arctic sun; desat with PT   Patient is currently intubated on ventilator support MV: 11 L/min Temp (24hrs), Avg:98.7 F (37.1 C), Min:98 F (36.7 C), Max:99.7 F (37.6 C)  Medications reviewed and include: colace, SSI, novolog, levemir, MVI with minerals, miralax  3% hypertonic saline  Labs reviewed: Na 150, A1C: 10.6 CBG's: 121-138    Diet Order:   Diet Order            Diet NPO time specified  Diet effective now                 EDUCATION NEEDS:   No education needs have been identified at this time  Skin:  Skin Assessment: Reviewed RN Assessment (incision)  Last BM:  3/29 small; type 7  Height:   Ht Readings from Last 1 Encounters:  07/06/20 5' 5"  (1.651 m)    Weight:   Wt Readings from Last 1 Encounters:  07/10/20 80.4 kg    Ideal  Body Weight:  56.8 kg  BMI:  Body mass index is 29.5 kg/m.  Estimated Nutritional Needs:   Kcal:  1800-2000  Protein:  90-110 grams  Fluid:  >1.8 L/day  Lockie Pares., RD, LDN, CNSC See AMiON for contact information

## 2020-07-11 NOTE — Progress Notes (Addendum)
NAME:  Brandi Myers, MRN:  [id number], DOB:  1985-04-26, LOS: 7 ADMISSION DATE:  07/04/2020, CONSULTATION DATE:  07/04/2020 REFERRING MD:  Dr. Corliss Skains, CHIEF COMPLAINT:  Vent management    History of Present Illness:  Brandi Myers is a 35 y.o. with PMH significant for HTN and type 2 diabetes who presented as a code stroke due to left sided weakness and slurred speech. Head CT demonstrated right ICA occlusion as well as right common carotid bifurcation thrombus resulting in acute stroke. Patient received TPA in ED at [facility]. Transferred to [facility] where she underwent revascularization of occluded terminal ICA and right MCA and right ACA. Patient remained intubated and was transferred to neuro ICU. Critical care consulted for ventilator management   Pertinent  Medical History   Uncontrolled diabetes and hypertension  Significant Hospital Events: Including procedures, antibiotic start and stop dates in addition to other pertinent events   .  3/23  Admitted as a code stroke and found to have occluded right ICA resulting in stroke.  Underwent IR revascularization and remained intubated post procedure . 3/24 R brachial clot requiring thrombectomy, hemorrhagic transformation on MRI and worsening midline shift on imaging . 3/25 NSGY decompressive craniectomy, TF started . 3/26 fevers post op, holding abx, SBT/SAT . 3/27 Tracheal aspirate with S.aureus and strep pneumo- started CTX and vanc . 3/28 Stopped vanc  Interim History / Subjective:  Stable on vent, more alert today.   Objective   Blood pressure (!) 166/81, pulse 77, temperature 98.6 F (37 C), temperature source Axillary, resp. rate 16, height 5\' 5"  (1.651 m), weight 80.4 kg, SpO2 100 %.    Vent Mode: PRVC FiO2 (%):  [30 %-40 %] 30 % Set Rate:  [16 bmp] 16 bmp Vt Set:  [450 mL] 450 mL PEEP:  [5 cmH20] 5 cmH20 Plateau Pressure:  [13 cmH20-16 cmH20] 16 cmH20   Intake/Output Summary (Last 24 hours) at 07/11/2020 0804 Last  data filed at 07/11/2020 0600 Gross per 24 hour  Intake 2968.39 ml  Output 1850 ml  Net 1118.39 ml   Filed Weights   07/06/20 2200 07/08/20 0310 07/10/20 0800  Weight: 72.3 kg 80.4 kg 80.4 kg    Examination: Constitutional: ill appearing woman on vent  Eyes: eyelids less swollen today, PERRL Ears, nose, mouth, and throat: ETT in place, small thick secretions Cardiovascular: RRR, ext warm Respiratory: CTAB Gastrointestinal: soft, +BS Skin: No rashes, normal turgor. Cranial and abdominal dressings are clean dry intact. 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 aureus and strep pnemo isolated on tracheal aspirate on 3/27 - Continue CTX 2g daily (day 4 of 7), off vanc since 3/28  Uncontrolled type 2 diabetes - Glucose improved - Continue levemir + TF coverage; goal CBG 100-180  Low Hgb - Recheck today - Improved s/p 1u RBCs on 3/27 - CTM   R limb critical limb ischemia - S/p  thrombectomy, site looks good  Low TSH, mildly elevated t4 - OP repeat labs once drugs out of system  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, hx polysubstance abuse Tobacco abuse  Best practice (evaluated daily)  Diet: Cortrak Pain/Anxiety/Delirium protocol (if indicated): scheduled tylenol, fentanyl, PRNs VAP protocol (if indicated): Yes DVT prophylaxis: SCD GI prophylaxis: PPI Glucose control:  Basal + SSI Central venous access:   N/A Arterial line:  N/A Foley:  No  Mobility:  bed rest  PT consulted: Yes Last date of multidisciplinary goals of care discussion: per primary Code Status:  full code Disposition: ICU  I personally spent 30 minutes providing critical care not including any separately billable procedures  [name], MS4 [facility]  Attending note: I have seen and examined the patient. History, labs and imaging reviewed.  35 year old with hypertension, diabetes presenting with right ICA occlusion and stroke TPA and revascularization. Developed significant brain edema requiring decompressive craniectomy on 3/25 More awake today  Blood pressure (!) 145/83, pulse 77, temperature 98.1 F (36.7 C), temperature source Axillary, resp. rate 12, height 5\' 5"  (1.651 m), weight 80.4 kg, SpO2 100 %. Gen:      No acute distress HEENT:  EOMI, sclera anicteric Neck:     No masses; no thyromegaly, ETT Lungs:    Clear to auscultation bilaterally; normal respiratory effort CV:         Regular rate and rhythm; no murmurs Abd:      + bowel sounds; soft, non-tender; no palpable masses, no distension Ext:    No edema; adequate peripheral perfusion Skin:      Warm and dry; no rash Neuro: Somnolent, arousable.  Labs/Imaging personally reviewed, significant for No labs today  Assessment/plan: Right ICA stroke with edema Status post revascularization, craniotomy Monitor Na off 3% saline. Off arctic sun, no more fevers  Acute respiratory failure Staph aureus, pneumococcus pneumonia Continue ceftriaxone day 4/7 Off vancomycin Start SBTs  Diabetes On lantus and SSI  The patient is critically ill with multiple organ systems failure and requires high complexity decision making for assessment and support, frequent evaluation and titration of therapies, application of advanced monitoring technologies and extensive interpretation of multiple databases.  Critical care time - 35 mins. This  represents my time independent of the NPs time taking care of the pt.  6/7 MD [facility] 07/11/2020, 9:51 AM

## 2020-07-11 NOTE — Progress Notes (Signed)
Occupational Therapy Treatment Patient Details Name: Brandi Myers MRN: [id number] DOB: [birth year] Today's Date: 07/11/2020    History of present illness 35 yo female admitted with L sided weakness givent TPA for R MCA involving basal ganglia frontal temporal lobes  3/23 s/p BIL revascularization  R MCA ACA achieving 2B revascularization PT intubated starting 3/23 . 3/24 R UE thromectomy CT reveals worsening midline shift start 3% saline 3/25 decompressive hemicranitectomy R side bone flap in abdomen 3/25 fever 103 PMH current smoker uncontrolled DM HTn   OT comments  Pt currently PS CPAP eob sitting with total (A) with bil eye open during session. Pt with increased secretions sitting. Pt requires close monitoring for R UE due to pulling at lines/ leads toward face. Pt nodding head yes and no with questions this session. Pt require deep suction at the end of session and positioned into midline with wedge and pillows to decrease L neck rotation. L UE positioned with towel roll due to clawing position but very sweaty. Concern for skin break down in a prefabricated splint for long durations of wear time. OT PROM L UE and will continue to montior closely. Recommendation CIR at this time.    Follow Up Recommendations  CIR    Equipment Recommendations  3 in 1 bedside commode;Wheelchair (measurements OT);Wheelchair cushion (measurements OT);Hospital bed    Recommendations for Other Services Rehab consult;Speech consult    Precautions / Restrictions Precautions Precautions: Fall Precaution Comments: vent, R wrist restraint bone flap out in R abdomen, L hemiparesis flexseal mitten R hand Restrictions Weight Bearing Restrictions: No       Mobility Bed Mobility Overal bed mobility: Needs Assistance Bed Mobility: Rolling;Supine to Sit;Sit to Supine Rolling: +2 for physical assistance;Max assist   Supine to sit: Total assist;+2 for physical assistance Sit to supine: Total assist;+2 for  physical assistance   General bed mobility comments: pt exiting bed on R side due to vent with attention to R side of head due to bone flap out. pt static sitting with (A) posteriorly with neck flexion and L rotation. pt requires total (A) for neck extension with increased secretions on PS/ CPAP at this time. pt with increased need for suction so returned to supine with RN arriving. Pad used to helicopter in and out of bed    Transfers                 General transfer comment: NT    Balance Overall balance assessment: Needs assistance Sitting-balance support: Single extremity supported;Feet supported Sitting balance-Leahy Scale: Poor Sitting balance - Comments: pt with lateral lean of trunk with hob elevated and requires repositioning several times. pt pushing with R UE toward L at times. Pt with pillows positioned bil sides to help with midline head and trunk for upright sitting. Pt with increased 02 saturation to 98%                         Standardized Balance Assessment Standardized Balance Assessment : Berg Balance Test         ADL either performed or assessed with clinical judgement   ADL Overall ADL's : Needs assistance/impaired     Grooming: Maximal assistance;Sitting Grooming Details (indicate cue type and reason): attempting to help pt to wash face due to coughing and drainage  General ADL Comments: pt with flexiseal with large volume of out put during session noted. Pt requires total (A) for all adls at this time     Vision       Perception     Praxis      Cognition Arousal/Alertness: Awake/alert Behavior During Therapy: Restless Overall Cognitive Status: Difficult to assess                                 General Comments: following commands with R hand. Nodding head yes and no. pt attempting to reach for face with alot of drainage from mouth/ nose.        Exercises     Shoulder  Instructions       General Comments PS/ CPAP during session with large volume of secretions clear. pt HR 130s. Pt with increased coughing noted. Pt with L UE/LE in extension with coughing    Pertinent Vitals/ Pain       Pain Assessment: Faces Faces Pain Scale: Hurts little more Pain Location: generaliz Pain Descriptors / Indicators: Discomfort Pain Intervention(s): Monitored during session;Repositioned;Premedicated before session  Home Living                                          Prior Functioning/Environment              Frequency  Min 2X/week        Progress Toward Goals  OT Goals(current goals can now be found in the care plan section)  Progress towards OT goals: Progressing toward goals  Acute Rehab OT Goals Patient Stated Goal: none stated (intubated) OT Goal Formulation: Patient unable to participate in goal setting Potential to Achieve Goals: Good ADL Goals Pt Will Perform Grooming: with mod assist;bed level Pt/caregiver will Perform Home Exercise Program: Left upper extremity;Increased strength;With minimal assist;With written HEP provided Additional ADL Goal #1: pt will follow 2 step commands Additional ADL Goal #2: pt will tolerate eob 10 minutes with stable vSS as precursor to adls  Plan Discharge plan remains appropriate    Co-evaluation    PT/OT/SLP Co-Evaluation/Treatment: Yes Reason for Co-Treatment: Complexity of the patient's impairments (multi-system involvement);Necessary to address cognition/behavior during functional activity;To address functional/ADL transfers PT goals addressed during session: Mobility/safety with mobility;Balance OT goals addressed during session: ADL's and self-care;Proper use of Adaptive equipment and DME;Strengthening/ROM      AM-PAC OT "6 Clicks" Daily Activity     Outcome Measure   Help from another person eating meals?: Total Help from another person taking care of personal grooming?:  Total Help from another person toileting, which includes using toliet, bedpan, or urinal?: Total Help from another person bathing (including washing, rinsing, drying)?: Total Help from another person to put on and taking off regular upper body clothing?: Total Help from another person to put on and taking off regular lower body clothing?: Total 6 Click Score: 6    End of Session Equipment Utilized During Treatment: Oxygen  OT Visit Diagnosis: Unsteadiness on feet (R26.81);Muscle weakness (generalized) (M62.81);Hemiplegia and hemiparesis Hemiplegia - Right/Left: Left Hemiplegia - dominant/non-dominant: Non-Dominant Hemiplegia - caused by: Cerebral infarction   Activity Tolerance Patient tolerated treatment well   Patient Left in bed;with call bell/phone within reach;with bed alarm set;with nursing/sitter in room;with restraints reapplied   Nurse Communication Mobility status;Precautions  Time: 1610-9604 OT Time Calculation (min): 22 min  Charges: OT General Charges $OT Visit: 1 Visit OT Treatments $Self Care/Home Management : 8-22 mins   Brynn, OTR/L  Acute Rehabilitation Services Pager: [phone] Office: [phone] .    Mateo Flow 07/11/2020, 1:18 PM

## 2020-07-11 NOTE — Progress Notes (Addendum)
STROKE TEAM PROGRESS NOTE   INTERVAL HISTORY No acute events overnight.  She is still intubated but much more alert today and able to follow commands. When asked if she wanted the tube removed she shook her head yes. She was able to move her Right arm and leg to command but still had no movement in her Left arm or leg. The swelling is reduced allowing her to open her Right eye on her own.  NeuroSurgery and Vascular Surgery signed off yesterday.  CCM is following.  Vitals:   07/11/20 0500 07/11/20 0600 07/11/20 0750 07/11/20 0802  BP: (!) 149/77 (!) 152/81 (!) 166/81   Pulse: 84 77    Resp:      Temp:      TempSrc:      SpO2: 100% 100%  100%  Weight:      Height:       CBC:  Recent Labs  Lab 07/04/20 1247 07/04/20 1844 07/05/20 0541 07/06/20 0240 07/09/20 0553 07/10/20 0621  WBC 17.8*  --  14.2*   < > 11.1* 8.9  NEUTROABS 12.7*  --  12.5*  --   --   --   HGB 9.5*   < > 8.6*   < > 7.7* 7.1*  HCT 35.0*   < > 32.0*   < > 30.0* 27.0*  MCV 67.2*  --  69.3*   < > 78.7* 77.4*  PLT 235  --  160   < > 200 237   < > = values in this interval not displayed.   Basic Metabolic Panel:  Recent Labs  Lab 07/09/20 0553 07/09/20 1319 07/09/20 2300 07/10/20 0621  NA 156*   < > 151* 150*  K 3.4*  --   --  3.4*  CL 118*  --   --  112*  CO2 31  --   --  32  GLUCOSE 160*  --   --  109*  BUN 9  --   --  7  CREATININE 0.49  --   --  0.49  CALCIUM 8.7*  --   --  8.2*  MG 1.9  --   --  1.9  PHOS 4.3  --   --  4.3   < > = values in this interval not displayed.   Lipid Panel:  Recent Labs  Lab 07/05/20 0541 07/08/20 0500  CHOL 154  --   TRIG 79  86 82  HDL 39*  --   CHOLHDL 3.9  --   VLDL 16  --   LDLCALC 99  --    HgbA1c:  Recent Labs  Lab 07/04/20 1814  HGBA1C 10.6*   Urine Drug Screen:  Recent Labs  Lab 07/05/20 0010  LABOPIA NONE DETECTED  COCAINSCRNUR POSITIVE*  LABBENZ NONE DETECTED  AMPHETMU NONE DETECTED  THCU POSITIVE*  LABBARB NONE DETECTED     Alcohol Level  Recent Labs  Lab 07/04/20 1247  ETH <10    IMAGING past 24 hours VAS [place] TRANSCRANIAL DOPPLER W BUBBLES  Result Date: 07/10/2020  Transcranial Doppler with Bubble Indications: Subarachnoid hemorrhage. Performing Technologist: Blanch Media RVS  Examination Guidelines: A complete evaluation includes B-mode imaging, spectral Doppler, color Doppler, and power Doppler as needed of all accessible portions of each vessel. Bilateral testing is considered an integral part of a complete examination. Limited examinations for reoccurring indications may be performed as noted.  Summary: No HITS at rest or during Valsalva. Negative transcranial Doppler Bubble study with no evidence of  right to left intracardiac communication.  A vascular evaluation was performed. The left middle cerebral artery was studied. An IV was inserted into the patient's left forearm . Verbal informed consent was obtained.  Negative TCD Bubble study *See table(s) above for TCD measurements and observations.  Diagnosing physician: Delia Heady MD Electronically signed by Delia Heady MD on 07/10/2020 at 2:21:29 PM.    Final     PHYSICAL EXAM Blood pressure (!) 166/81, pulse 77, temperature 98.6 F (37 C), temperature source Axillary, resp. rate 16, height 5\' 5"  (1.651 m), weight 80.4 kg, SpO2 100 %.  General: intubated, alert and awake, caucasian female, no apparent distress  Lungs: Symmetrical Chest rise, no labored breathing  Cardio: Regular Rate and Rhythm  Abdomen: Soft, non-tender  Neuro: intubated but alert. Able to follow commands.  Has right gaze preference.  Unable to the left past midline.  Blinks to threat on the right but not on the left. Cranial Nerves: II:  Visual fields grossly normal, pupils equal, round, reactive to light and accommodation III,IV, VI: ptosis not present, extra-ocular motions intact bilaterally V,VII: smile symmetric, facial light touch sensation normal bilaterally VIII: hearing  normal bilaterally IX,X: uvula rises symmetrically XI: bilateral shoulder shrug XII: midline tongue extension without atrophy or fasciculations  Motor: Right :  Upper extremity   4/5                                      Left:     Upper extremity   0/5             Lower extremity   5/5                                                  Lower extremity   0/5 Tone and bulk:normal tone throughout; no atrophy noted    ASSESSMENT/PLAN Ms. KRISTLE WESCH is a 35 y.o. female with history of uncontrolled diabetesandhypertensionpresenting with left sided weakness and slurred speech.Per family there is a complicated social situation at this time. She lives intermittently in an apartment by herself but was kicked out and they went to pick her up at 5:30 AM. At that point she seemed "off"and generally quiet but did not seem to have any difficulty with left-sided weakness.Family attributed this to psychosocial issues and potential substance use noting that her sister passed away from an overdose in October. Later in the morning she complained of diffuse body pain and was administered gabapentin and a muscle relaxer.At 11:30 AM she stood up and walkedto the showerand at1215 she was found on the bottom of the tub with slurred speech and difficulty moving the left side. She was not preactivated by EMS, but on arrival in the ED clearly appear to have a doorway examination consistent with a right MCA territory infarct.CTA revealed right ICA occlusion as well as a right common carotid bifurcation thrombus extending into the external carotid (felt to be the source of the occlusion). CT perfusion imaging demonstrated possible core infarct of 51 mL (though notably this was obtained within 2 hours of symptom onset and therefore may reflect an overestimate of the true core). Mismatch ratio was 2 with a penumbra of 63 mL. Case was discussed with Dr. 06-19-2002 of neuro interventional radiology and  Dr. Otelia Limes of  [facility] neurology and the patient was accepted for transfer to [facility] for emergent intervention. She was also started on TPA after consenting the patient's emergency contact with full discussion of risks of bleeding and importance of accurately determining the patient's last known well.[name] confirm the last known well as described above and agreed that the patient would want TPA given the risk/benefit.She confirmed that the patient had no recent procedures or other contraindications to TPA.Regarding her baseline, she is functional at baseline and family believes she is smoking at least1pack a day currently. She is on Metformin for her diabetes, does not take oral contraceptive pills, has had no recent surgeries or procedures, has not had any complaints of bleeding and family believes that she would have informed them of any significant bleeding.   TCD bubble study revealed no PFO. Will start Lovenox DVT as she is now sufficiently removed from her surgery. Will stop the SCD's. BMP and CBC have not been drawn yet but are ordered. Will continue with daily draws to monitor electrolytes, Kidney function, WBC, and Hgb. Will continue to monitor. Will undergo trial for tube removal per CCM. Will continue to monitor. Blood cultures show no growth at 5 days.   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   CTA head and neck showed right ICA terminal and proximal MCA occlusion, right CCA nonocclusive thrombus.  CT perfusion showed large penumbra.   Status post thrombectomy with right terminal ICA, right PCA and right ACA TICI2breperfusion however does have distal micro emboli.  MRI showed large right MCA infarct with 17mm MLS  and hemorrhagic transformation.   MRA showed patent intracranial vessels.  CT Head WO Contrast 3/26 620 - Improved midline shift and normalized left lateral ventricular volume after decompressive craniectomy. No progressive infarct or hemorrhage.  2D Echo- EF: 60-65%. No wall motion abnormality.  TCD Bubble - No PFO  Consider TEE once stable  DVT US -No evidence of DVT bilaterally  LDL99  HgbA1c10.6  VTE prophylaxis -Lovenox  No antithromboticprior to admission, now on No antithromboticdue to Hemorrhagic conversion and S/P Hemicraniotomy  Therapy recommendations:Pending  Disposition:Pending  Cerebral edema  CT Headrepeat3/24 -Continued interval evolution of right MCA distribution infarct with associated hemorrhagic transformation. Worsened regional mass effect and edema with increased right-to-left midline shift now measuring up to 9 mm.Increased dilatation of the right lateral ventricle concerning for developing ventricular trapping. No other new acute intracranial abnormality.  CT repeat 3/25 unchangedright cerebral cytotoxic edema with central hemorrhage. Midline shift is similar at 1 cm.Unchanged left lateral ventricle dilatation with evidence of transependymal flow.  S/pdecompressive hemicraniectomywith Dr. Venetia Maxon  CT Head WO Contrast 3/26 620 - Improved midline shift and normalized left lateral ventricular volume after decompressive craniectomy. No progressive infarct or hemorrhage.  On keppra  3% salineoff  S/p PICC line  Na goal 150-155  Na 137->141->147->150>158>150   Fever, central origin?  Tmax 101.7->102.5> 98.6> 98.7  WBC - yesterday was 8.9 repeat is pending.  Consider TEE once stable to rule out endocarditis  UA neg  CXR-Interval placement of a left upper extremity PICC with tip overlying the superior cavoatrial junction. No visible pneumothorax. Interval advancement of the endotracheal tube with tip now overlying the  distal thoracic trachea.  Respiratory Culture -  Strep Pneumoniae  B CxNo growth at 5 days  On [place]  On Ancef  Respiratory failure  Intubated on vent  CCM on board  CXR-Interval placement of a left upper extremity PICC with tip overlying the superior cavoatrial junction. No visible pneumothorax. Interval advancement of the endotracheal tube with tip now overlying the distal thoracic trachea.  Respiratory Culture - Strep Pneumoniae  Hyperlipidemia  Home meds:none  LDL 99, goal < 70  On lipitor 40 now  Continue statin at discharge  Diabetes type II Uncontrolled  Home meds:Metformin, Glargine, Aspart  HgbA1c 10.6, goal < 7.0  CBGs  SSI  Cocaine abuse  UDS positive for cocaine  Cessation education will be provided  Tobacco abuse  Current smoker  Smoking cessation counselingwill beprovided  Other Stroke Risk Factors  Substance abuse - UDS: THC POSITIVEWill advise patientto stop using due to stroke risk.  Other Active Problems  Sick thyroid?- TSH: 0.307 (Low) Free T4:1.27 (high)   Blood Cultures draw 3/25No growth at 4 days  Respiratory Culture - Strep Pneumoniae on Ceftriaxone 2 g daily   Hospital day # 7  Arna SnipeAlex Pashayan MD Resident I have personally obtained history,examined this patient, reviewed notes, independently viewed imaging studies, participated in medical decision making and plan of care.ROS completed by me personally and pertinent positives fully documented  I have made any additions or clarifications directly to the above note. Agree with note above.  Patient remains on ventilatory support for respiratory failure but hopefully will try to wean her off ventilatory support over the next few days if tolerated.  Discussed with patient and Dr. Isaiah SergeMannam critical care medicine.  Start aspirin 81 mg daily This patient is critically ill and at significant risk of neurological worsening, death and care requires constant  monitoring of vital signs, hemodynamics,respiratory and cardiac monitoring, extensive review of multiple databases, frequent neurological assessment, discussion with family, other specialists and medical decision making of high complexity.I have made any additions or clarifications directly to the above note.This critical care time does not reflect procedure time, or teaching time or supervisory time of PA/NP/Med Resident etc but could involve care discussion time.  I spent 30 minutes of neurocritical care time  in the care of  this patient.      Delia HeadyPramod Emmalise Huard, MD Medical Director [facility] Pager: [phone] 07/11/2020 3:47 PM   To contact Stroke Continuity provider, please refer to [email or web address]. After hours, contact General Neurology

## 2020-07-11 NOTE — Progress Notes (Signed)
[facility]  Subjective  - intubated.   Objective (!) 145/83 77 98.1 F (36.7 C) (Axillary) 12 100%  Intake/Output Summary (Last 24 hours) at 07/11/2020 0946 Last data filed at 07/11/2020 0800 Gross per 24 hour  Intake 3018.45 ml  Output 1850 ml  Net 1168.45 ml    Right radial and ulnar brisk by doppler at the wrist.  Laboratory Lab Results: Recent Labs    07/09/20 0553 07/10/20 0621  WBC 11.1* 8.9  HGB 7.7* 7.1*  HCT 30.0* 27.0*  PLT 200 237   BMET Recent Labs    07/09/20 0553 07/09/20 1319 07/09/20 2300 07/10/20 0621  NA 156*   < > 151* 150*  K 3.4*  --   --  3.4*  CL 118*  --   --  112*  CO2 31  --   --  32  GLUCOSE 160*  --   --  109*  BUN 9  --   --  7  CREATININE 0.49  --   --  0.49  CALCIUM 8.7*  --   --  8.2*   < > = values in this interval not displayed.    COAG Lab Results  Component Value Date   INR 1.2 07/04/2020   No results found for: PTT  Assessment/Planning:  35 year old female status post right upper extremity thrombectomy last Thursday.  Incision looks good.  She has brisk radial ulnar signals at the wrist.  Remains critically ill and intubated.  Vascular surgery is available if questions or concerns arise.  Cephus Shelling 07/11/2020 9:46 AM --

## 2020-07-11 NOTE — Progress Notes (Signed)
Subjective: Patient reports that she is on the ventilator and on fentanyl gtt. She is more interactive today and is able to follow simple commands on her right side.   Objective: Vital signs in last 24 hours: Temp:  [98 F (36.7 C)-99.7 F (37.6 C)] 98.1 F (36.7 C) (03/30 0800) Pulse Rate:  [75-96] 77 (03/30 0600) Resp:  [12-18] 12 (03/30 0900) BP: (142-166)/(67-97) 145/83 (03/30 0900) SpO2:  [98 %-100 %] 100 % (03/30 0802) FiO2 (%):  [30 %-40 %] 30 % (03/30 0802)  Intake/Output from previous day: 03/29 0701 - 03/30 0700 In: 3095 [I.V.:565; NG/GT:2230; IV Piggyback:300] Out: 1850 [Urine:1850] Intake/Output this shift: Total I/O In: 50.1 [I.V.:50.1] Out: -   Physical Exam: Patient attempts to open eyes to voice. She is following simple commands on the right side. Eyes fixed in forward position. PERRL. No movement on the left. Postive Babinski's reflex on the left. Cranial and abdominal dressings areCDI. Incisions arewell approximated with no drainage, erythema, or edema.  Lab Results: Recent Labs    07/09/20 0553 07/10/20 0621  WBC 11.1* 8.9  HGB 7.7* 7.1*  HCT 30.0* 27.0*  PLT 200 237   BMET Recent Labs    07/09/20 0553 07/09/20 1319 07/09/20 2300 07/10/20 0621  NA 156*   < > 151* 150*  K 3.4*  --   --  3.4*  CL 118*  --   --  112*  CO2 31  --   --  32  GLUCOSE 160*  --   --  109*  BUN 9  --   --  7  CREATININE 0.49  --   --  0.49  CALCIUM 8.7*  --   --  8.2*   < > = values in this interval not displayed.    Studies/Results: VAS [place] TRANSCRANIAL DOPPLER W BUBBLES  Result Date: 07/10/2020  Transcranial Doppler with Bubble Indications: Subarachnoid hemorrhage. Performing Technologist: Blanch Media RVS  Examination Guidelines: A complete evaluation includes B-mode imaging, spectral Doppler, color Doppler, and power Doppler as needed of all accessible portions of each vessel. Bilateral testing is considered an integral part of a complete examination. Limited  examinations for reoccurring indications may be performed as noted.  Summary: No HITS at rest or during Valsalva. Negative transcranial Doppler Bubble study with no evidence of right to left intracardiac communication.  A vascular evaluation was performed. The left middle cerebral artery was studied. An IV was inserted into the patient's left forearm . Verbal informed consent was obtained.  Negative TCD Bubble study *See table(s) above for TCD measurements and observations.  Diagnosing physician: Delia Heady MD Electronically signed by Delia Heady MD on 07/10/2020 at 2:21:29 PM.    Final     Assessment/Plan: Patient continues to be neurologically stable and has had an improvement in her mental status. She is now following commands on the right side . No new neurosurgical recommendations. Continue supportive care. Further management per stroke team. Call with any questions.    LOS: 7 days     Council Mechanic, DNP, NP-C 07/11/2020, 9:58 AM

## 2020-07-11 NOTE — Progress Notes (Signed)
Physical Therapy Treatment Patient Details Name: Brandi Myers MRN: [id number] DOB: October 26, 1985 Today's Date: 07/11/2020    History of Present Illness 35 yo female admitted with L sided weakness givent TPA for R MCA involving basal ganglia frontal temporal lobes  3/23 s/p BIL revascularization  R MCA ACA achieving 2B revascularization PT intubated starting 3/23 . 3/24 R UE thromectomy CT reveals worsening midline shift start 3% saline 3/25 decompressive hemicranitectomy R side bone flap in abdomen 3/25 fever 103 PMH current smoker uncontrolled DM HTn    PT Comments    Pt weaning on vent, able to maintain oxygen saturations > 90% throughout.  Pt continues to follow right sided commands. Session focused on transition to edge of bed to assess functional mobility and sitting balance. Pt requiring two person total assist for supine <> sit, modA to maintain balance, +2 for safety to prevent pt from pulling lines/leads with RUE. Increased secretions with upright positioning and RN present at end of session to deep suction. At risk for skin breakdown due to persistent diaphoresis. Continue to recommend comprehensive inpatient rehab (CIR) for post-acute therapy needs.    Follow Up Recommendations  CIR     Equipment Recommendations  Other (comment) (TBA)    Recommendations for Other Services       Precautions / Restrictions Precautions Precautions: Fall Precaution Comments: vent, R wrist restraint bone flap out in R abdomen, L hemiparesis Restrictions Weight Bearing Restrictions: No    Mobility  Bed Mobility Overal bed mobility: Needs Assistance Bed Mobility: Supine to Sit;Sit to Supine     Supine to sit: Total assist;+2 for physical assistance Sit to supine: Total assist;+2 for physical assistance   General bed mobility comments: Helicopter method using bed pad for supine <> sit, totalA + 2    Transfers                 General transfer comment: NT  Ambulation/Gait                  Stairs             Wheelchair Mobility    Modified Rankin (Stroke Patients Only) Modified Rankin (Stroke Patients Only) Pre-Morbid Rankin Score: No symptoms Modified Rankin: Severe disability     Balance Overall balance assessment: Needs assistance Sitting-balance support: No upper extremity supported;Feet supported Sitting balance-Leahy Scale: Poor Sitting balance - Comments: ModA for balance                                    Cognition Arousal/Alertness: Awake/alert Behavior During Therapy: Restless Overall Cognitive Status: Difficult to assess                                 General Comments: Following R sided commands      Exercises      General Comments        Pertinent Vitals/Pain Pain Assessment: Faces Faces Pain Scale: Hurts little more Pain Location: generalized ( especially with deep suction by RN staff) Pain Descriptors / Indicators: Discomfort Pain Intervention(s): Monitored during session;Limited activity within patient's tolerance    Home Living                      Prior Function            PT Goals (current goals can now  be found in the care plan section) Acute Rehab PT Goals Patient Stated Goal: none stated (intubated) PT Goal Formulation: Patient unable to participate in goal setting Time For Goal Achievement: 07/24/20 Potential to Achieve Goals: Fair Progress towards PT goals: Progressing toward goals    Frequency    Min 4X/week      PT Plan      Co-evaluation PT/OT/SLP Co-Evaluation/Treatment: Yes Reason for Co-Treatment: Necessary to address cognition/behavior during functional activity;Complexity of the patient's impairments (multi-system involvement);For patient/therapist safety;To address functional/ADL transfers PT goals addressed during session: Mobility/safety with mobility;Balance        AM-PAC PT "6 Clicks" Mobility   Outcome Measure  Help needed  turning from your back to your side while in a flat bed without using bedrails?: Total Help needed moving from lying on your back to sitting on the side of a flat bed without using bedrails?: Total Help needed moving to and from a bed to a chair (including a wheelchair)?: Total Help needed standing up from a chair using your arms (e.g., wheelchair or bedside chair)?: Total Help needed to walk in hospital room?: Total Help needed climbing 3-5 steps with a railing? : Total 6 Click Score: 6    End of Session Equipment Utilized During Treatment: Oxygen Activity Tolerance: Patient tolerated treatment well Patient left: in bed;with call bell/phone within reach;with restraints reapplied Nurse Communication: Mobility status PT Visit Diagnosis: Other abnormalities of gait and mobility (R26.89);Hemiplegia and hemiparesis;Other symptoms and signs involving the nervous system (R29.898) Hemiplegia - Right/Left: Left Hemiplegia - dominant/non-dominant: Non-dominant Hemiplegia - caused by: Cerebral infarction     Time: 4967-5916 PT Time Calculation (min) (ACUTE ONLY): 26 min  Charges:  $Therapeutic Activity: 8-22 mins                     Lillia Pauls, PT, DPT Acute Rehabilitation Services Pager [phone] Office [phone]    [name] 07/11/2020, 1:12 PM

## 2020-07-12 DIAGNOSIS — I6601 Occlusion and stenosis of right middle cerebral artery: Secondary | ICD-10-CM | POA: Diagnosis not present

## 2020-07-12 LAB — CBC
HCT: 29 % — ABNORMAL LOW (ref 36.0–46.0)
Hemoglobin: 7.6 g/dL — ABNORMAL LOW (ref 12.0–15.0)
MCH: 20.1 pg — ABNORMAL LOW (ref 26.0–34.0)
MCHC: 26.2 g/dL — ABNORMAL LOW (ref 30.0–36.0)
MCV: 76.5 fL — ABNORMAL LOW (ref 80.0–100.0)
Platelets: 295 10*3/uL (ref 150–400)
RBC: 3.79 MIL/uL — ABNORMAL LOW (ref 3.87–5.11)
RDW: 25.4 % — ABNORMAL HIGH (ref 11.5–15.5)
WBC: 9.2 10*3/uL (ref 4.0–10.5)
nRBC: 0.5 % — ABNORMAL HIGH (ref 0.0–0.2)

## 2020-07-12 LAB — BASIC METABOLIC PANEL
Anion gap: 4 — ABNORMAL LOW (ref 5–15)
BUN: 7 mg/dL (ref 6–20)
CO2: 29 mmol/L (ref 22–32)
Calcium: 8.3 mg/dL — ABNORMAL LOW (ref 8.9–10.3)
Chloride: 108 mmol/L (ref 98–111)
Creatinine, Ser: 0.52 mg/dL (ref 0.44–1.00)
GFR, Estimated: >60 mL/min (ref >60)
Glucose, Bld: 117 mg/dL — ABNORMAL HIGH (ref 70–99)
Potassium: 3.8 mmol/L (ref 3.5–5.1)
Sodium: 141 mmol/L (ref 135–145)

## 2020-07-12 LAB — GLUCOSE, CAPILLARY
Glucose-Capillary: 125 mg/dL — ABNORMAL HIGH (ref 70–99)
Glucose-Capillary: 133 mg/dL — ABNORMAL HIGH (ref 70–99)
Glucose-Capillary: 133 mg/dL — ABNORMAL HIGH (ref 70–99)
Glucose-Capillary: 136 mg/dL — ABNORMAL HIGH (ref 70–99)
Glucose-Capillary: 116 mg/dL — ABNORMAL HIGH (ref 70–99)
Glucose-Capillary: 181 mg/dL — ABNORMAL HIGH (ref 70–99)

## 2020-07-12 MED ORDER — FUROSEMIDE 10 MG/ML IJ SOLN
40.0000 mg | Freq: Once | INTRAMUSCULAR | Status: AC
  Administered 2020-07-12: 40 mg via INTRAVENOUS
  Filled 2020-07-12: qty 4

## 2020-07-12 NOTE — Progress Notes (Addendum)
STROKE TEAM PROGRESS NOTE   INTERVAL HISTORY She is intubated but alert and awake. She was able to follow all commands. Still only minimally withdraws Left leg to noxious stimuli, no movement in Left arm. In discussion with CCM they will attempt to extubate today.  Vital signs are stable.  Blood pressure adequately controlled.  White count stable.  Serum sodium is dropped to 141. NeuroSurgery and CCM are following Vitals:   07/12/20 0400 07/12/20 0500 07/12/20 0600 07/12/20 0759  BP: (!) 146/86 130/80 134/67   Pulse: 87 87 91   Resp: 20 16 16    Temp: 98.5 F (36.9 C)     TempSrc: Axillary     SpO2: 100% 100% 100% 99%  Weight:  81 kg    Height:       CBC:  Recent Labs  Lab 07/10/20 0621 07/12/20 0500  WBC 8.9 9.2  HGB 7.1* 7.6*  HCT 27.0* 29.0*  MCV 77.4* 76.5*  PLT 237 295   Basic Metabolic Panel:  Recent Labs  Lab 07/09/20 0553 07/09/20 1319 07/10/20 0621 07/12/20 0500  NA 156*   < > 150* 141  K 3.4*  --  3.4* 3.8  CL 118*  --  112* 108  CO2 31  --  32 29  GLUCOSE 160*  --  109* 117*  BUN 9  --  7 7  CREATININE 0.49  --  0.49 0.52  CALCIUM 8.7*  --  8.2* 8.3*  MG 1.9  --  1.9  --   PHOS 4.3  --  4.3  --    < > = values in this interval not displayed.   Lipid Panel:  Recent Labs  Lab 07/08/20 0500  TRIG 82   HgbA1c: No results for input(s): HGBA1C in the last 168 hours. Urine Drug Screen: No results for input(s): LABOPIA, COCAINSCRNUR, LABBENZ, AMPHETMU, THCU, LABBARB in the last 168 hours.  Alcohol Level No results for input(s): ETH in the last 168 hours.  IMAGING past 24 hours No results found.  PHYSICAL EXAM Blood pressure 134/67, pulse 91, temperature 98.5 F (36.9 C), temperature source Axillary, resp. rate 16, height 5\' 5"  (1.651 m), weight 81 kg, SpO2 99 %.  General: alert and awake, caucasian female, no apparent distress.  Right craniotomy bony defect with staples.  Eyes partially swollen but now able to open spontaneously  Lungs: Symmetrical  Chest rise, no labored breathing  Cardio: Regular Rate and Rhythm  Abdomen: Soft, non-tender  Neuro: Intubated but Alert and Awake, Able to follow all commands without difficulty. Cranial Nerves: II:  Visual fields grossly normal, pupils equal, round, reactive to light and accommodation III,IV, VI: ptosis not present, has Left gaze palsy V,VII: swelling still present but face otherwise symetric VIII: hearing normal bilaterally XII: midline tongue extension without atrophy or fasciculations  Motor: Right : Upper extremity   5/5    Left:     Upper extremity   0/5  Lower extremity   5/5     Lower extremity   0/5 Tone and bulk:normal tone throughout; no atrophy noted   ASSESSMENT/PLAN Brandi Myers is a 35 y.o. female with history of uncontrolled diabetesandhypertensionpresenting with left sided weakness and slurred speech.Per family there is a complicated social situation at this time. She lives intermittently in an apartment by herself but was kicked out and they went to pick her up at 5:30 AM. At that point she seemed "off"and generally quiet but did not seem to have any difficulty  with left-sided weakness.Family attributed this to psychosocial issues and potential substance use noting that her sister passed away from an overdose in October. Later in the morning she complained of diffuse body pain and was administered gabapentin and a muscle relaxer.At 11:30 AM she stood up and walkedto the showerand at1215 she was found on the bottom of the tub with slurred speech and difficulty moving the left side. She was not preactivated by EMS, but on arrival in the ED clearly appear to have a doorway examination consistent with a right MCA territory infarct.CTA revealed right ICA occlusion as well as a right common carotid bifurcation thrombus extending into the external carotid (felt to be the source of the occlusion). CT perfusion imaging demonstrated possible core infarct of 51 mL  (though notably this was obtained within 2 hours of symptom onset and therefore may reflect an overestimate of the true core). Mismatch ratio was 2 with a penumbra of 63 mL. Case was discussed with Dr. Corliss Skains of neuro interventional radiology and Dr. Otelia Limes of [facility] and the patient was accepted for transfer to [facility] for emergent intervention. She was also started on TPA after consenting the patient's emergency contact with full discussion of risks of bleeding and importance of accurately determining the patient's last known well.Brandi Myers confirm the last known well as described above and agreed that the patient would want TPA given the risk/benefit.She confirmed that the patient had no recent procedures or other contraindications to TPA.Regarding her baseline, she is functional at baseline and family believes she is smoking at least1pack a day currently. She is on Metformin for her diabetes, does not take oral contraceptive pills, has had no recent surgeries or procedures, has not had any complaints of bleeding and family believes that she would have informed them of any significant bleeding.   She continues to improve ad be more purposeful in her movements. In discussion with CCM feel it is appropriate now to attempt extubation. The swelling in her Right scalp and eyelid has reduced to the point that she is able to open her Right eyelid but it is still difficult. Will continue to monitor.   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   CTA head and neck showed right ICA terminal and proximal MCA occlusion, right CCA nonocclusive thrombus.  CT perfusion showed large penumbra.   Status post thrombectomy with  right terminal ICA, right PCA and right ACA TICI2breperfusion however does have distal micro emboli.  MRI showed large right MCA infarct with 49mm MLS and hemorrhagic transformation.   MRA showed patent intracranial vessels.  CT Head WO Contrast 3/26 620 - Improved midline shift and normalized left lateral ventricular volume after decompressive craniectomy. No progressive infarct or hemorrhage.  2D Echo- EF: 60-65%. No wall motion abnormality.  TCD Bubble - No PFO  Consider TEE once stable  DVT US -No evidence of DVT bilaterally  LDL99  HgbA1c10.6  VTE prophylaxis -Lovenox  No antithromboticprior to admission, now on No antithromboticdue to Hemorrhagic conversionand S/P Hemicraniotomy  Therapy recommendations:Pending  Disposition:Pending  Cerebral edema  CT Headrepeat3/24 -Continued interval evolution of right MCA distribution infarct with associated hemorrhagic transformation. Worsened regional mass effect and edema with increased right-to-left midline shift now measuring up to 9 mm.Increased dilatation of the right lateral ventricle concerning  for developing ventricular trapping. No other new acute intracranial abnormality.  CT repeat 3/25 unchangedright cerebral cytotoxic edema with central hemorrhage. Midline shift is similar at 1 cm.Unchanged left lateral ventricle dilatation with evidence of transependymal flow.  S/pdecompressive hemicraniectomywith Dr. Venetia Maxon  CT Head WO Contrast 3/26 620 - Improved midline shift and normalized left lateral ventricular volume after decompressive craniectomy. No progressive infarct or hemorrhage.  On keppra  3% salineoff  S/p PICC line  Na goal 150-155  Na 137->141->147->150>158>150> 141  Fever, central origin?  Tmax 101.7->102.5> 98.6> 98.7  WBC - yesterday was 8.9 repeat is pending.  Consider TEE once stable to rule out cardioembolic source  UA neg  CXR-Interval placement of a left  upper extremity PICC with tip overlying the superior cavoatrial junction. No visible pneumothorax. Interval advancement of the endotracheal tube with tip now overlying the distal thoracic trachea.  Respiratory Culture - Strep Pneumoniae  B CxNo growth after5days drawn 3/25  Arctic sun stopped  On Ancef  Respiratory failure  Intubated on vent  CCM on board  CXR-Interval placement of a left upper extremity PICC with tip overlying the superior cavoatrial junction. No visible pneumothorax. Interval advancement of the endotracheal tube with tip now overlying the distal thoracic trachea.  Respiratory Culture - Strep Pneumoniae  Hyperlipidemia  Home meds:none  LDL 99, goal < 70  On lipitor 40 now  Continue statin at discharge  Diabetes type II Uncontrolled  Home meds:Metformin, Glargine, Aspart  HgbA1c 10.6, goal < 7.0  CBGs  SSI  Cocaine abuse  UDS positive for cocaine  Cessation education will be provided  Tobacco abuse  Current smoker  Smoking cessation counselingwill beprovided  Other Stroke Risk Factors  Substance abuse - UDS: THC POSITIVEWill advise patientto stop using due to stroke risk.  Other Active Problems  Sick thyroid?- TSH: 0.307 (Low) Free T4:1.27 (high)   Blood Cultures draw 3/25No growth after 5days  Respiratory Culture - Strep Pneumoniae on Ceftriaxone 2 g daily   Hospital day # 8  Arna Snipe MD Resident I have personally obtained history,examined this patient, reviewed notes, independently viewed imaging studies, participated in medical decision making and plan of care.ROS completed by me personally and pertinent positives fully documented  I have made any additions or clarifications directly to the above note. Agree with note above.  Continue ongoing therapies.  Patient seems to be weaning hopefully she can come off ventilatory support soon but need to discuss with family plans for reintubation if she  gets into difficulties and then will likely need tracheostomy.  Discussed with Dr. Isaiah Serge critical care medicine. This patient is critically ill and at significant risk of neurological worsening, death and care requires constant monitoring of vital signs, hemodynamics,respiratory and cardiac monitoring, extensive review of multiple databases, frequent neurological assessment, discussion with family, other specialists and medical decision making of high complexity.I have made any additions or clarifications directly to the above note.This critical care time does not reflect procedure time, or teaching time or supervisory time of PA/NP/Med Resident etc but could involve care discussion time.  I spent 30 minutes of neurocritical care time  in the care of  this patient.      Delia Heady, MD Medical Director [facility] Pager: [phone] 07/12/2020 3:32 PM   To contact Stroke Continuity provider, please refer to [email or web address]. After hours, contact General Neurology

## 2020-07-12 NOTE — Progress Notes (Signed)
Nutrition Follow-up  DOCUMENTATION CODES:   Not applicable  INTERVENTION:   Tube feeding via Cortrak tube: Glucerna 1.5 at 50 ml/h (1200 ml per day)  Provides 1800 kcal, 99 gm protein, 911 ml free water daily  MVI with minerals   200 ml free water every 6 hours Total free water: 1711 ml   NUTRITION DIAGNOSIS:   Inadequate oral intake related to inability to eat as evidenced by NPO status. Ongoing.   GOAL:   Patient will meet greater than or equal to 90% of their needs Met with TF.   MONITOR:   TF tolerance,Diet advancement  REASON FOR ASSESSMENT:   Consult Enteral/tube feeding initiation and management,Assessment of nutrition requirement/status  ASSESSMENT:   Pt with PMH of HTN and DM who admitted with L sided weakness and slurred speech per CT pt with R CVA.    Pt discussed during ICU rounds and with RN.  Pt extubated this am, not ready for swallow eval. Will continue TF via Cortrak tube. Pt to work with therapy, pt out on left side.    3/23 s/p tPA and IR 3/24 hemorrhagic transformation, RUE thrombectomy  3/25 s/p R crani with placement of bone flap in R abd 3/28 cortrak placed; tip gastric  3/29 off arctic sun; desat with PT    Medications reviewed and include: colace, SSI, novolog, levemir, MVI with minerals, miralax  Labs reviewed: A1C: 10.6 CBG's: 125-193    Diet Order:   Diet Order            Diet NPO time specified  Diet effective now                 EDUCATION NEEDS:   No education needs have been identified at this time  Skin:  Skin Assessment: Reviewed RN Assessment (incision)  Last BM:  3/29 small; type 7  Height:   Ht Readings from Last 1 Encounters:  07/06/20 5' 5"  (1.651 m)    Weight:   Wt Readings from Last 1 Encounters:  07/12/20 81 kg    Ideal Body Weight:  56.8 kg  BMI:  Body mass index is 29.72 kg/m.  Estimated Nutritional Needs:   Kcal:  1800-2000  Protein:  90-110 grams  Fluid:  >1.8  L/day  Lockie Pares., RD, LDN, CNSC See AMiON for contact information

## 2020-07-12 NOTE — Progress Notes (Signed)
Occupational Therapy Treatment Patient Details Name: Brandi Myers MRN: [id number] DOB: [birth year] Today's Date: 07/12/2020    History of present illness 35 yo female admitted with L sided weakness givent TPA for R MCA involving basal ganglia frontal temporal lobes  3/23 s/p BIL revascularization  R MCA ACA achieving 2B revascularization PT intubated starting 3/23 . 3/24 R UE thromectomy CT reveals worsening midline shift start 3% saline 3/25 decompressive hemicranitectomy R side bone flap in abdomen 3/25 fever 103 PMH current smoker uncontrolled DM HTn   OT comments  Lt UE remains flaccid with moderate edema.  PROM WFL. Retrograde massage performed and Lt UE placed in a good position to maintain extension digits and extension of wrist to facilitate edema mobilization.   Feel she doesn't need to be splinted at this time as ROM passively is good, however, will continue to monitor for needs.   Follow Up Recommendations  CIR    Equipment Recommendations  3 in 1 bedside commode;Wheelchair (measurements OT);Wheelchair cushion (measurements OT);Hospital bed    Recommendations for Other Services Rehab consult;Speech consult    Precautions / Restrictions Precautions Precautions: Fall Precaution Comments: vent, R wrist restraint bone flap out in R abdomen, L hemiparesis flexseal mitten R hand       Mobility Bed Mobility                    Transfers                      Balance                                           ADL either performed or assessed with clinical judgement   ADL                                               Vision       Perception     Praxis      Cognition Arousal/Alertness: Awake/alert;Lethargic Behavior During Therapy: Flat affect                                   General Comments: Pt will open eyes to command.  She nods yes/no to questions.  She follows one step commands  inconsistently        Exercises Exercises: Other exercises Other Exercises Other Exercises: PROM performed Lt UE.  Very mild flexor tightness noted in digits, but passively able to achieve full composite extension of Lt. hand.  minimal sweating noted as well as moderate edema.  Retrograde massage performed with Lt UE positioned on blanket roll with digits extended and wrist extended to aid in edema mobilization.   Shoulder Instructions       General Comments      Pertinent Vitals/ Pain       Pain Assessment: Faces Faces Pain Scale: Hurts a little bit Pain Location: Rt UE via yes/no questions Pain Descriptors / Indicators: Grimacing Pain Intervention(s): Monitored during session  Home Living  Prior Functioning/Environment              Frequency  Min 2X/week        Progress Toward Goals  OT Goals(current goals can now be found in the care plan section)  Progress towards OT goals: Not progressing toward goals - comment     Plan Discharge plan remains appropriate    Co-evaluation                 AM-PAC OT "6 Clicks" Daily Activity     Outcome Measure   Help from another person eating meals?: Total Help from another person taking care of personal grooming?: Total Help from another person toileting, which includes using toliet, bedpan, or urinal?: Total Help from another person bathing (including washing, rinsing, drying)?: Total Help from another person to put on and taking off regular upper body clothing?: Total Help from another person to put on and taking off regular lower body clothing?: Total 6 Click Score: 6    End of Session Equipment Utilized During Treatment: Oxygen  OT Visit Diagnosis: Unsteadiness on feet (R26.81);Muscle weakness (generalized) (M62.81);Hemiplegia and hemiparesis Hemiplegia - Right/Left: Left Hemiplegia - dominant/non-dominant: Non-Dominant Hemiplegia - caused by:  Cerebral infarction   Activity Tolerance Patient tolerated treatment well   Patient Left in bed   Nurse Communication Mobility status        Time: 0957-1010 OT Time Calculation (min): 13 min  Charges: OT General Charges $OT Visit: 1 Visit OT Treatments $Neuromuscular Re-education: 8-22 mins  Eber Jones OTR/L Acute Rehabilitation Services Pager [phone] Office [phone]    Jeani Hawking M 07/12/2020, 3:37 PM

## 2020-07-12 NOTE — Progress Notes (Signed)
IV Team consulted for PICC removal and  PIV placement and blood draw due to difficult stick.  Spoke w/RN, Lauren regarding current Rx's and lab draws through 4/4.  Will re-evaluate removal of PICC at a later time.

## 2020-07-12 NOTE — Progress Notes (Signed)
Subjective: Patient reports that she responds to voice and is continuing to follow commands on her right side.   Objective: Vital signs in last 24 hours: Temp:  [97.5 F (36.4 C)-99.5 F (37.5 C)] 98.5 F (36.9 C) (03/31 0400) Pulse Rate:  [76-104] 91 (03/31 0600) Resp:  [12-20] 16 (03/31 0600) BP: (126-166)/(67-90) 134/67 (03/31 0600) SpO2:  [96 %-100 %] 100 % (03/31 0600) FiO2 (%):  [30 %] 30 % (03/31 0400) Weight:  [81 kg] 81 kg (03/31 0500)  Intake/Output from previous day: 03/30 0701 - 03/31 0700 In: 2674.8 [I.V.:574.8; NG/GT:1800; IV Piggyback:300] Out: 2200 [Urine:2200] Intake/Output this shift: No intake/output data recorded.  Physical Exam: Patient attempts to open eyes to voice. She is following simple commands on the right side. Eyes fixed in forward position. PERRL. Minimal withdrawal to noxious stimuli in her LLE. No movement in her LUE. Cranial and abdominal dressings areCDI. Incisions arewell approximated with no drainage, erythema, or edema.  Lab Results: Recent Labs    07/10/20 0621 07/12/20 0500  WBC 8.9 9.2  HGB 7.1* 7.6*  HCT 27.0* 29.0*  PLT 237 295   BMET Recent Labs    07/10/20 0621 07/12/20 0500  NA 150* 141  K 3.4* 3.8  CL 112* 108  CO2 32 29  GLUCOSE 109* 117*  BUN 7 7  CREATININE 0.49 0.52  CALCIUM 8.2* 8.3*    Studies/Results: VAS [place] TRANSCRANIAL DOPPLER W BUBBLES  Result Date: 07/10/2020  Transcranial Doppler with Bubble Indications: Subarachnoid hemorrhage. Performing Technologist: Blanch Media RVS  Examination Guidelines: A complete evaluation includes B-mode imaging, spectral Doppler, color Doppler, and power Doppler as needed of all accessible portions of each vessel. Bilateral testing is considered an integral part of a complete examination. Limited examinations for reoccurring indications may be performed as noted.  Summary: No HITS at rest or during Valsalva. Negative transcranial Doppler Bubble study with no evidence of right  to left intracardiac communication.  A vascular evaluation was performed. The left middle cerebral artery was studied. An IV was inserted into the patient's left forearm . Verbal informed consent was obtained.  Negative TCD Bubble study *See table(s) above for TCD measurements and observations.  Diagnosing physician: Delia Heady MD Electronically signed by Delia Heady MD on 07/10/2020 at 2:21:29 PM.    Final     Assessment/Plan: Patientcontinues to show small improvements in her neurological examination. She is continuing to follow commands on her right side. Minimal withdrawal to noxious stimuli in her LLE. No movement in her LUE. No new neurosurgical recommendations. Continue supportive care. Further management per stroke team.Call with any questions.   LOS: 8 days     Council Mechanic, DNP, NP-C 07/12/2020, 7:15 AM

## 2020-07-12 NOTE — Procedures (Signed)
Extubation Procedure Note  Patient Details:   Name: Brandi Myers DOB: 1985/07/20 MRN: [id number]   Airway Documentation:    Vent end date: 07/12/20 Vent end time: 1021   Evaluation  O2 sats: stable throughout Complications: No apparent complications Patient did tole rate procedure well. Bilateral Breath Sounds: Diminished,Rhonchi   Yes, pt could speak post extubation.  Pt extubated to 4 l/m  per physician order.  Audrie Lia 07/12/2020, 10:26 AM

## 2020-07-12 NOTE — Progress Notes (Addendum)
NAME:  Brandi Myers, MRN:  [id number], DOB:  Jul 17, 1985, LOS: 8 ADMISSION DATE:  07/04/2020, CONSULTATION DATE:  07/04/2020 REFERRING MD:  Dr. Corliss Skains, CHIEF COMPLAINT:  Vent management    History of Present Illness:  Brandi Myers is a 35 y.o. with PMH significant for HTN and type 2 diabetes who presented as a code stroke due to left sided weakness and slurred speech. Head CT demonstrated right ICA occlusion as well as right common carotid bifurcation thrombus resulting in acute stroke. Patient received TPA in ED at [facility]. Transferred to [facility] where she underwent revascularization of occluded terminal ICA and right MCA and right ACA. Patient remained intubated and was transferred to neuro ICU. Critical care consulted for ventilator management.  Pertinent  Medical History   Uncontrolled diabetes and hypertension  Significant Hospital Events: Including procedures, antibiotic start and stop dates in addition to other pertinent events   .  3/23  Admitted as a code stroke and found to have occluded right ICA resulting in stroke.  Underwent IR revascularization and remained intubated post procedure . 3/24 R brachial clot requiring thrombectomy, hemorrhagic transformation on MRI and worsening midline shift on imaging . 3/25 NSGY decompressive craniectomy, TF started . 3/26 fevers post op, holding abx, SBT/SAT . 3/27 Tracheal aspirate with S.aureus and strep pneumo- started CTX and vanc . 3/28 Stopped vanc  Interim History / Subjective:  More alert today. Coughing this morning with small frothy secretions. Started SBT yesterday, plan to extubate today.   Objective   Blood pressure 134/67, pulse 91, temperature 98.5 F (36.9 C), temperature source Axillary, resp. rate 16, height 5\' 5"  (1.651 m), weight 81 kg, SpO2 99 %.    Vent Mode: PSV;CPAP FiO2 (%):  [30 %] 30 % Set Rate:  [16 bmp] 16 bmp Vt Set:  [450 mL] 450 mL PEEP:  [5 cmH20] 5 cmH20 Pressure Support:  [8 cmH20] 8  cmH20 Plateau Pressure:  [10 cmH20] 10 cmH20   Intake/Output Summary (Last 24 hours) at 07/12/2020 0804 Last data filed at 07/12/2020 0600 Gross per 24 hour  Intake 2624.75 ml  Output 2200 ml  Net 424.75 ml   Filed Weights   07/08/20 0310 07/10/20 0800 07/12/20 0500  Weight: 80.4 kg 80.4 kg 81 kg    Examination: Constitutional: alert, awake, intubated  Eyes: eyelids less swollen, PERRL, able to blink on R to threat but not on the L Ears, nose, mouth, and throat: ETT in place, small frothy secretions Cardiovascular: RRR, ext warm Respiratory: CTAB Gastrointestinal: soft, +BS Skin: No rashes, normal turgor. Cranial and abdominal dressings are clean dry intact. 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 aureus and strep pnemo isolated on tracheal aspirate on 3/27 - Continue CTX 2g daily (day 5 of 7), off vanc since 3/28  Uncontrolled type 2 diabetes - Glucose improved - Continue levemir + TF coverage; goal CBG 100-180  Low Hgb -  Improved s/p 1u RBCs on 3/27 - CTM   R limb critical limb ischemia - S/p thrombectomy, site looks good  Low TSH, mildly elevated t4 - OP repeat labs once drugs out of system  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, hx polysubstance abuse Tobacco abuse  Best practice (evaluated daily)  Diet: Cortrak Pain/Anxiety/Delirium protocol (if indicated): scheduled tylenol, fentanyl, PRNs VAP protocol (if  indicated): Yes DVT prophylaxis: LMWH GI prophylaxis: PPI Glucose control:  Basal + SSI Central venous access:  N/A Arterial line:  N/A Foley:  No  Mobility:  bed rest  PT consulted: Yes Last date of multidisciplinary goals of care discussion: per primary Code Status:  full code Disposition: ICU  [name], MS4 [facility]  Attending note: I have seen and examined the patient. History, labs and imaging reviewed.  35 year old with hypertension, diabetes presenting with right ICA occlusion and stroke TPA and revascularization. Developed significant brain edema requiring decompressive craniectomy on 3/25 She has been weaning on pressure support for the past 2 days, mental status slightly better compared to yesterday  Blood pressure (!) 150/60, pulse (!) 114, temperature 99.5 F (37.5 C), temperature source Axillary, resp. rate 19, height 5\' 5"  (1.651 m), weight 81 kg, SpO2 97 %. Gen:      No acute distress HEENT:  EOMI, sclera anicteric, craniotomy incision clean and dry Neck:     No masses; no thyromegaly, ETT Lungs:    Clear to auscultation bilaterally; normal respiratory effort CV:         Regular rate and rhythm; no murmurs Abd:      + bowel sounds; soft, non-tender; no palpable masses, no distension Ext:    No edema; adequate peripheral perfusion Skin:      Warm and dry; no rash Neuro: Sedated, opens eyes and follows commands. Moves right side to commands  Labs/Imaging personally reviewed, significant for Labs are stable, hemoglobin 7.6 No new imaging  Assessment/plan: Right ICA stroke with edema Status post revascularization, craniotomy Mental status better  Acute respiratory failure MSSA, pneumococcus pneumonia Continue ceftriaxone day 5/7 Tolerating SBT. Likely extubate today If she fails extubation then will need a trach Lasix 40 mg x 1  Diabetes On lantus and SSI  Aunt called and updated 3/31. No answer from father  The patient is  critically ill with multiple organ systems failure and requires high complexity decision making for assessment and support, frequent evaluation and titration of therapies, application of advanced monitoring technologies and extensive interpretation of multiple databases.  Critical care time - 35 mins. This represents my time independent of the NPs time taking care of the pt.  4/31 MD [facility] 07/12/2020, 9:09 AM

## 2020-07-12 NOTE — Progress Notes (Signed)
Physical Therapy Treatment Patient Details Name: Brandi Myers MRN: [id number] DOB: [birth year] Today's Date: 07/12/2020    History of Present Illness 35 yo female admitted with L sided weakness givent TPA for R MCA involving basal ganglia frontal temporal lobes  3/23 s/p BIL revascularization  R MCA ACA achieving 2B revascularization PT intubated starting 3/23 . 3/24 R UE thromectomy CT reveals worsening midline shift start 3% saline 3/25 decompressive hemicranitectomy R side bone flap in abdomen 3/25 fever 103. Extubated 3/31. PMH current smoker uncontrolled DM HTn    PT Comments    Pt willing to participate, needing TAx2 for supine <> sit transitions and to roll to R with maxAx2 to roll to L. Pt needing multi-modal cues and extra time to process and respond. MinA-maxA posteriorly for static sitting balance EOB due to poor trunk coordination, balance, reactional strategies, and awareness of her deficits. Pt cued to activate muscles through bil exercises, but min-no activation noted on her L side this date. Pt positioned with towel roll under L side of neck/head to encourage more midline positioning. Will continue to follow acutely. Current recommendations remain appropriate at this time.    Follow Up Recommendations  CIR     Equipment Recommendations  Other (comment) (TBA)    Recommendations for Other Services       Precautions / Restrictions Precautions Precautions: Fall Precaution Comments: R wrist restraint, bone flap out in R abdomen, L hemiparesis, rectal tube, A-line, NG tube Restrictions Weight Bearing Restrictions: No    Mobility  Bed Mobility Overal bed mobility: Needs Assistance Bed Mobility: Rolling;Supine to Sit;Sit to Supine Rolling: +2 for physical assistance;Max assist;+2 for safety/equipment   Supine to sit: Total assist;+2 for physical assistance;+2 for safety/equipment Sit to supine: Total assist;+2 for physical assistance;+2 for safety/equipment   General  bed mobility comments: pt exiting bed on L with TAx2 and use of pad to helicopter pt supine <> sit. Unable to fully scoot hips anteriorly due to loss of pad under buttocks and unsuccessful attempts with +2 to slide hips, attending to rectal tube to avoid pulling. Pt assisting to roll to L with max cues for R foot placement and R hand reach, maxAx2. TAx2 to roll to R.    Transfers                 General transfer comment: NT  Ambulation/Gait                 Stairs             Wheelchair Mobility    Modified Rankin (Stroke Patients Only) Modified Rankin (Stroke Patients Only) Pre-Morbid Rankin Score: No symptoms Modified Rankin: Severe disability     Balance Overall balance assessment: Needs assistance Sitting-balance support: Single extremity supported;Feet unsupported Sitting balance-Leahy Scale: Poor Sitting balance - Comments: Pt cued to reach R UE for end of bed rail for support, intermittent mod success. Pt with posterior and L lean, needing up to maxA posteriorly for support. Inetrmittent minA when pt holding onto end of bed rail and when cued to lean elbows onto knees to facilitate anterior lean. Postural control: Left lateral lean;Posterior lean                                  Cognition Arousal/Alertness: Awake/alert Behavior During Therapy: Restless;Flat affect;Impulsive Overall Cognitive Status: Difficult to assess  General Comments: following commands with R hand. Nodding head yes and no. pt attempting to reach for face and NG tube with a lot of drainage from mouth/ nose, needing repeated cues and blockage of R hand to prevent pulling lines.      Exercises General Exercises - Lower Extremity Long Arc Quad: Strengthening;Right;10 reps;Seated (no activation on L noted with palpation when cued) Other Exercises Other Exercises: Scapular restraction, elevation, and depression in sitting ~3-5  reps each, min activation noted on L    General Comments General comments (skin integrity, edema, etc.): Positioned pt up in bed with R wrist restraint reapplied and towel roll under L side of neck for more midline positioning of head and extension of neck due to high levels of drooling.      Pertinent Vitals/Pain Pain Assessment: Faces Faces Pain Scale: Hurts a little bit Pain Location: generalized (pt unable to express where her pain was) Pain Descriptors / Indicators: Discomfort;Grimacing Pain Intervention(s): Limited activity within patient's tolerance;Monitored during session;Repositioned    Home Living                      Prior Function            PT Goals (current goals can now be found in the care plan section) Acute Rehab PT Goals Patient Stated Goal: pt agreeable to sitting up EOB, did not explicitly state PT Goal Formulation: Patient unable to participate in goal setting Time For Goal Achievement: 07/24/20 Potential to Achieve Goals: Fair Progress towards PT goals: Progressing toward goals    Frequency    Min 4X/week      PT Plan Current plan remains appropriate    Co-evaluation              AM-PAC PT "6 Clicks" Mobility   Outcome Measure  Help needed turning from your back to your side while in a flat bed without using bedrails?: Total Help needed moving from lying on your back to sitting on the side of a flat bed without using bedrails?: Total Help needed moving to and from a bed to a chair (including a wheelchair)?: Total Help needed standing up from a chair using your arms (e.g., wheelchair or bedside chair)?: Total Help needed to walk in hospital room?: Total Help needed climbing 3-5 steps with a railing? : Total 6 Click Score: 6    End of Session Equipment Utilized During Treatment: Oxygen Activity Tolerance: Patient tolerated treatment well Patient left: in bed;with call bell/phone within reach;with bed alarm set;with restraints  reapplied   PT Visit Diagnosis: Other abnormalities of gait and mobility (R26.89);Hemiplegia and hemiparesis;Other symptoms and signs involving the nervous system (R29.898);Muscle weakness (generalized) (M62.81);Difficulty in walking, not elsewhere classified (R26.2) Hemiplegia - Right/Left: Left Hemiplegia - dominant/non-dominant: Non-dominant Hemiplegia - caused by: Cerebral infarction     Time: 1607-3710 PT Time Calculation (min) (ACUTE ONLY): 33 min  Charges:  $Therapeutic Activity: 8-22 mins $Neuromuscular Re-education: 8-22 mins                     Raymond Gurney, PT, DPT Acute Rehabilitation Services  Pager: [phone] Office: [phone]    Brandi Myers 07/12/2020, 6:01 PM

## 2020-07-13 ENCOUNTER — Inpatient Hospital Stay (HOSPITAL_COMMUNITY): Payer: Medicaid Other

## 2020-07-13 DIAGNOSIS — J9602 Acute respiratory failure with hypercapnia: Secondary | ICD-10-CM

## 2020-07-13 DIAGNOSIS — J15212 Pneumonia due to Methicillin resistant Staphylococcus aureus: Secondary | ICD-10-CM | POA: Diagnosis not present

## 2020-07-13 DIAGNOSIS — I6601 Occlusion and stenosis of right middle cerebral artery: Secondary | ICD-10-CM | POA: Diagnosis not present

## 2020-07-13 LAB — BASIC METABOLIC PANEL
Anion gap: 9 (ref 5–15)
BUN: 10 mg/dL (ref 6–20)
CO2: 26 mmol/L (ref 22–32)
Calcium: 8.9 mg/dL (ref 8.9–10.3)
Chloride: 108 mmol/L (ref 98–111)
Creatinine, Ser: 0.57 mg/dL (ref 0.44–1.00)
GFR, Estimated: >60 mL/min (ref >60)
Glucose, Bld: 178 mg/dL — ABNORMAL HIGH (ref 70–99)
Potassium: 3.4 mmol/L — ABNORMAL LOW (ref 3.5–5.1)
Sodium: 143 mmol/L (ref 135–145)

## 2020-07-13 LAB — URINALYSIS, COMPLETE (UACMP) WITH MICROSCOPIC
Bilirubin Urine: NEGATIVE
Glucose, UA: NEGATIVE mg/dL
Hgb urine dipstick: NEGATIVE
Ketones, ur: NEGATIVE mg/dL
Leukocytes,Ua: NEGATIVE
Nitrite: NEGATIVE
Protein, ur: NEGATIVE mg/dL
Specific Gravity, Urine: 1.024 (ref 1.005–1.030)
pH: 9 — ABNORMAL HIGH (ref 5.0–8.0)

## 2020-07-13 LAB — GLUCOSE, CAPILLARY
Glucose-Capillary: 158 mg/dL — ABNORMAL HIGH (ref 70–99)
Glucose-Capillary: 166 mg/dL — ABNORMAL HIGH (ref 70–99)
Glucose-Capillary: 188 mg/dL — ABNORMAL HIGH (ref 70–99)
Glucose-Capillary: 116 mg/dL — ABNORMAL HIGH (ref 70–99)
Glucose-Capillary: 116 mg/dL — ABNORMAL HIGH (ref 70–99)
Glucose-Capillary: 198 mg/dL — ABNORMAL HIGH (ref 70–99)

## 2020-07-13 LAB — CBC
HCT: 31.8 % — ABNORMAL LOW (ref 36.0–46.0)
Hemoglobin: 8.5 g/dL — ABNORMAL LOW (ref 12.0–15.0)
MCH: 20.9 pg — ABNORMAL LOW (ref 26.0–34.0)
MCHC: 26.7 g/dL — ABNORMAL LOW (ref 30.0–36.0)
MCV: 78.3 fL — ABNORMAL LOW (ref 80.0–100.0)
Platelets: 432 10*3/uL — ABNORMAL HIGH (ref 150–400)
RBC: 4.06 MIL/uL (ref 3.87–5.11)
RDW: 27.5 % — ABNORMAL HIGH (ref 11.5–15.5)
WBC: 16.2 10*3/uL — ABNORMAL HIGH (ref 4.0–10.5)
nRBC: 0.3 % — ABNORMAL HIGH (ref 0.0–0.2)

## 2020-07-13 MED ORDER — POTASSIUM CHLORIDE 20 MEQ PO PACK
40.0000 meq | PACK | Freq: Once | ORAL | Status: DC
  Filled 2020-07-13: qty 2

## 2020-07-13 MED ORDER — LEVETIRACETAM 100 MG/ML PO SOLN
500.0000 mg | Freq: Two times a day (BID) | ORAL | Status: DC
  Administered 2020-07-13 – 2020-07-27 (×27): 500 mg
  Filled 2020-07-13 (×27): qty 5

## 2020-07-13 MED ORDER — LACTATED RINGERS IV BOLUS
1000.0000 mL | Freq: Once | INTRAVENOUS | Status: AC
  Administered 2020-07-13: 1000 mL via INTRAVENOUS

## 2020-07-13 MED ORDER — POTASSIUM CHLORIDE 20 MEQ PO PACK
40.0000 meq | PACK | Freq: Once | ORAL | Status: AC
  Administered 2020-07-13: 40 meq

## 2020-07-13 NOTE — Evaluation (Signed)
Speech Language Pathology Evaluation Patient Details Name: Brandi Myers MRN: [id number] DOB: [birth year] Today's Date: 07/13/2020 Time: 7517-0017 SLP Time Calculation (min) (ACUTE ONLY): 17 min  Problem List:  Patient Active Problem List   Diagnosis Date Noted  . 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 Cellulitis 02/26/2016   Past Medical History:  Past Medical History:  Diagnosis Date  . Diabetes mellitus without complication (HCC)   . Hypertension    Past Surgical History:  Past Surgical History:  Procedure Laterality Date  . CESAREAN SECTION    . CHOLECYSTECTOMY    . CRANIOTOMY Right 07/06/2020   Procedure: RIGHT CRANIECTOMY WITH PLACEMENT OF BONE FLAP IN ABDOMEN;  Surgeon: Maeola Harman, MD;  Location: [facility] OR;  Service: Neurosurgery;  Laterality: Right;  . IR PERCUTANEOUS ART THROMBECTOMY/INFUSION INTRACRANIAL INC DIAG ANGIO  07/04/2020  . RADIOLOGY WITH ANESTHESIA N/A 07/04/2020   Procedure: IR WITH ANESTHESIA;  Surgeon: Radiologist, Medication, MD;  Location: [facility] OR;  Service: Radiology;  Laterality: N/A;  . THROMBECTOMY BRACHIAL ARTERY Right 07/05/2020   Procedure: RIGHT UPPER EXTREMITY THROMBECTOMY;  Surgeon: Cephus Shelling, MD;  Location: [facility] OR;  Service: Vascular;  Laterality: Right;   HPI:  35 yo female admitted with L sided weakness, given TPA for R MCA involving basal ganglia, frontal, temporal lobes  3/23 s/p BIL revascularization  R MCA ACA achieving 2B revascularization. Pt intubated 3/23-31. 3/24 R UE thrombectomy. CT revealed worsening midline shift. 3/25 decompressive hemicranitectomy, R side bone flap in abdomen. PMH current smoker uncontrolled DM HTn   Assessment / Plan / Recommendation Clinical Impression  Pt presents with global deficits in sustained attention, basic recall, and awareness.  Her voice is low in volume after prolonged intubation.  Motor and verbal responses are delayed, and pt required  multiple prompts to initiate verbal replies. She was able to recite DOW, MOY with cues to overcome impersistence.  She was oriented to person, year, and place when offered choice of three options.  Spontaneous speech was often difficult to understand and incoherent. Pt will benefit from SLP f/u to address basic cognitive processes as listed above.    SLP Assessment  SLP Recommendation/Assessment: Patient needs continued Speech Lanaguage Pathology Services SLP Visit Diagnosis: Dysphagia, oropharyngeal phase (R13.12)    Follow Up Recommendations  Inpatient Rehab    Frequency and Duration min 2x/week  2 weeks      SLP Evaluation Cognition  Overall Cognitive Status: Impaired/Different from baseline Arousal/Alertness: Awake/alert Orientation Level: Oriented to person;Other (comment);Disoriented to time;Disoriented to situation (oriented to place with choice of three) Attention: Sustained Sustained Attention: Impaired Sustained Attention Impairment: Verbal basic Memory: Impaired Memory Impairment: Storage deficit Awareness: Impaired Awareness Impairment: Intellectual impairment Problem Solving: Impaired Problem Solving Impairment: Verbal basic;Functional basic Behaviors: Restless;Impulsive Safety/Judgment: Impaired       Comprehension  Auditory Comprehension Overall Auditory Comprehension: Other (comment) (follows basic commands) Visual Recognition/Discrimination Discrimination: Not tested Reading Comprehension Reading Status: Not tested    Expression Expression Primary Mode of Expression: Verbal Verbal Expression Overall Verbal Expression:  (tba) Automatic Speech: Social Response;Counting;Day of week;Month of year (wfl) Level of Generative/Spontaneous Verbalization: Phrase   Oral / Motor  Oral Motor/Sensory Function Overall Oral Motor/Sensory Function: Moderate impairment Facial Strength: Reduced left;Suspected CN VII (facial) dysfunction Facial Sensation: Reduced  left;Suspected CN V (Trigeminal) dysfunction Motor Speech Overall Motor Speech: Impaired Phonation: Aphonic Articulation: Impaired Motor Planning: Witnin functional limits   GO  Blenda Mounts Laurice 07/13/2020, 10:52 AM  Jill Side. Samson Frederic, MA CCC/SLP Acute Rehabilitation Services Office number [phone] Pager [phone]

## 2020-07-13 NOTE — Progress Notes (Signed)
NAME:  Brandi Myers, MRN:  [id number], DOB:  [birth year], LOS: 9 ADMISSION DATE:  07/04/2020, CONSULTATION DATE:  07/04/2020 REFERRING MD:  Dr. Corliss Skains, CHIEF COMPLAINT:  Vent management    History of Present Illness:  Brandi Myers is a 35 y.o. with PMH significant for HTN and type 2 diabetes who presented as a code stroke due to left sided weakness and slurred speech. Head CT demonstrated right ICA occlusion as well as right common carotid bifurcation thrombus resulting in acute stroke. Patient received TPA in ED at [facility]. Transferred to [facility] where she underwent revascularization of occluded terminal ICA and right MCA and right ACA. Patient remained intubated and was transferred to neuro ICU. Critical care consulted for ventilator management.  Pertinent  Medical History   Uncontrolled diabetes and hypertension  Significant Hospital Events: Including procedures, antibiotic start and stop dates in addition to other pertinent events   .  3/23  Admitted as a code stroke and found to have occluded right ICA resulting in stroke.  Underwent IR revascularization and remained intubated post procedure . 3/24 R brachial clot requiring thrombectomy, hemorrhagic transformation on MRI and worsening midline shift on imaging . 3/25 NSGY decompressive craniectomy, TF started . 3/26 fevers post op, holding abx, SBT/SAT . 3/27 Tracheal aspirate with S.aureus and strep pneumo- started CTX and vanc . 3/28 Stopped vanc . 3/31 extubated  Interim History / Subjective:  Remains tachycardic but increased white count Successfully extubated yesterday   Objective   Blood pressure 117/70, pulse (!) 120, temperature 100 F (37.8 C), temperature source Oral, resp. rate (!) 27, height 5\' 5"  (1.651 m), weight 81.3 kg, SpO2 93 %.        Intake/Output Summary (Last 24 hours) at 07/13/2020 1009 Last data filed at 07/13/2020 0900 Gross per 24 hour  Intake 1827.68 ml  Output 2550 ml  Net -722.32 ml    Filed Weights   07/10/20 0800 07/12/20 0500 07/13/20 0500  Weight: 80.4 kg 81 kg 81.3 kg    Examination: General: Young [name], lying on the bed HEENT: Status post craniotomy, scar looks clean, pupils bilateral equal round react light, cortrak in place Cardiovascular: Tachycardic, regular rhythm, no murmur or gallop Respiratory: Reduced air entry at the bases, no wheezes or rhonchi Gastrointestinal: Soft, nontender, nondistended bowel sounds present Skin: No rashes, normal turgor.  Neurologic: Awake, following simple commands, rightward gaze deviation, moving right side  Resolved Hospital Problem list     Assessment & Plan:  Acute right MCA/ACA territory stroke with vasogenic edema status post thrombectomy S/p decompressive craniectomy 3/25 for increasing edema Of 3% Stroke team is following Continue secondary stroke prophylaxis  Acute respiratory insufficiency secondary to above Successfully extubated yesterday, tolerating well so far  MSSA and strep pneumonia Continue CTX 2g daily (day 6 of 7) White count is trending up Repeat x-ray chest  Poorly controlled diabetes type 2, with hyperglycemia Blood sugars are better controlled now Continue levemir + TF coverage; goal CBG 140-180  Anemia of critical illness H&H is stable, continue to monitor  R limb critical limb ischemia S/p thrombectomy, site looks good Appreciate vascular surgery follow-up  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, hx polysubstance abuse Tobacco abuse Watch for signs of withdrawal  Best practice (evaluated daily)  Diet: Cortrak Pain/Anxiety/Delirium protocol (if indicated): scheduled tylenol, fentanyl, PRNs VAP protocol (if indicated): Yes DVT prophylaxis: LMWH GI prophylaxis: PPI Glucose control:  Basal + SSI Central venous access:  N/A Arterial line:  N/A Foley:  No  Mobility: Out of bed to chair PT consulted: Yes Last date of multidisciplinary goals of care discussion: per primary Code Status:   full code Disposition: ICU    Total critical care time: 41 minutes  Performed by: Cheri Fowler   Critical care time was exclusive of separately billable procedures and treating other patients.   Critical care was necessary to treat or prevent imminent or life-threatening deterioration.   Critical care was time spent personally by me on the following activities: development of treatment plan with patient and/or surrogate as well as nursing, discussions with consultants, evaluation of patient's response to treatment, examination of patient, obtaining history from patient or surrogate, ordering and performing treatments and interventions, ordering and review of laboratory studies, ordering and review of radiographic studies, pulse oximetry and re-evaluation of patient's condition.   Cheri Fowler MD [facility] See Amion for pager If no response to pager, please call [phone] until 7pm After 7pm, Please call E-link [phone]

## 2020-07-13 NOTE — Progress Notes (Signed)
Ok to give KCL x1 for K+3.4 per [employer].   Ulyses Southward, PharmD, BCIDP, AAHIVP, CPP Infectious Disease Pharmacist 07/13/2020 8:37 AM

## 2020-07-13 NOTE — Progress Notes (Signed)
Physical Therapy Treatment Patient Details Name: Brandi Myers MRN: [id number] DOB: May 19, 1985 Today's Date: 07/13/2020    History of Present Illness 35 yo female admitted with L sided weakness givent TPA for R MCA involving basal ganglia frontal temporal lobes  3/23 s/p BIL revascularization  R MCA ACA achieving 2B revascularization PT intubated starting 3/23 . 3/24 R UE thromectomy CT reveals worsening midline shift start 3% saline 3/25 decompressive hemicranitectomy R side bone flap in abdomen 3/25 fever 103. Extubated 3/31. PMH current smoker uncontrolled DM HTn    PT Comments    Focused session on strengthening and balance training in egress bed position. Pt with min-no activation to assist with sitting up away from back support of bed in this position, thereby needing TAx2 to come to sit up but then she was able to maintain her static sitting balance with mod-maxA. Pt impulsive, pushing self with R UE towards her L and pulling her R foot up onto the bed while attempting to scoot inferiorly at times. Once her R hand was placed on her R thigh she had improved balance. Pt with L side neglect, only moving R limbs when cued even though explicitly cued to move the L. Eyes not passing midline today. Did not hoyer to chair yet due to pt's impulsivity and lack of deficits and safety and that she tends to wiggle down and sideways in bed and would likely wiggle out of the chair. Will continue to follow acutely. Current recommendations remain appropriate.    Follow Up Recommendations  CIR     Equipment Recommendations  Other (comment) (TBA)    Recommendations for Other Services       Precautions / Restrictions Precautions Precautions: Fall Precaution Comments: R wrist restraint, bone flap out in R abdomen, L hemiparesis, rectal tube, NG tube Restrictions Weight Bearing Restrictions: No    Mobility  Bed Mobility Overal bed mobility: Needs Assistance Bed Mobility: Supine to Sit      Supine to sit: Total assist;+2 for physical assistance;+2 for safety/equipment     General bed mobility comments: Bed placed in egress position, cuing pt to pull her trunk anteriorly to sit up from lying back on elevated HOB, but min-no activation from pt to obtain upright sitting position, thus TAX2.    Transfers                 General transfer comment: NT  Ambulation/Gait                 Stairs             Wheelchair Mobility    Modified Rankin (Stroke Patients Only) Modified Rankin (Stroke Patients Only) Pre-Morbid Rankin Score: No symptoms Modified Rankin: Severe disability     Balance Overall balance assessment: Needs assistance Sitting-balance support: Single extremity supported;Feet unsupported Sitting balance-Leahy Scale: Poor Sitting balance - Comments: Bed in egress position with side rails up. Pt cued to pull self to midline with R hand on R rail but pt would tend to push self to L instead. Pt able to maintain static sitting balance with mod-maxA after being placed in sitting dependently. Pt with improved balance when R hand placed on R knee. Postural control: Left lateral lean;Posterior lean                                  Cognition Arousal/Alertness: Lethargic Behavior During Therapy: Restless;Flat affect;Impulsive Overall Cognitive Status: Difficult  to assess                                 General Comments: following commands with R side inconsistently with extra time. Nodding head yes and no. Pt impulsive reaching and pushing R UE at times. Pt lethargic, keeping eyes closed often and needing cues to open them. Pt with poor safety awareness pushing herself over often.      Exercises General Exercises - Upper Extremity Shoulder Flexion: AAROM;Both;10 reps;Seated General Exercises - Lower Extremity Long Arc Quad: Strengthening;Right;10 reps;Seated (no activation on L noted with palpation when cued) Other  Exercises Other Exercises: Neck extension in sitting intermittently with AAROM    General Comments        Pertinent Vitals/Pain Pain Assessment: Faces Faces Pain Scale: Hurts a little bit Pain Location: generalized (pt unable to express where her pain was) Pain Descriptors / Indicators: Discomfort;Grimacing Pain Intervention(s): Limited activity within patient's tolerance;Monitored during session;Repositioned    Home Living                      Prior Function            PT Goals (current goals can now be found in the care plan section) Acute Rehab PT Goals Patient Stated Goal: pt wanting to stretch PT Goal Formulation: Patient unable to participate in goal setting Time For Goal Achievement: 07/24/20 Potential to Achieve Goals: Fair Progress towards PT goals: Progressing toward goals    Frequency    Min 4X/week      PT Plan Current plan remains appropriate    Co-evaluation              AM-PAC PT "6 Clicks" Mobility   Outcome Measure  Help needed turning from your back to your side while in a flat bed without using bedrails?: Total Help needed moving from lying on your back to sitting on the side of a flat bed without using bedrails?: Total Help needed moving to and from a bed to a chair (including a wheelchair)?: Total Help needed standing up from a chair using your arms (e.g., wheelchair or bedside chair)?: Total Help needed to walk in hospital room?: Total Help needed climbing 3-5 steps with a railing? : Total 6 Click Score: 6    End of Session Equipment Utilized During Treatment: Oxygen Activity Tolerance: Patient tolerated treatment well Patient left: in bed;with call bell/phone within reach;with bed alarm set;with restraints reapplied   PT Visit Diagnosis: Other abnormalities of gait and mobility (R26.89);Hemiplegia and hemiparesis;Other symptoms and signs involving the nervous system (R29.898);Muscle weakness (generalized)  (M62.81);Difficulty in walking, not elsewhere classified (R26.2) Hemiplegia - Right/Left: Left Hemiplegia - dominant/non-dominant: Non-dominant Hemiplegia - caused by: Cerebral infarction     Time: 2993-7169 PT Time Calculation (min) (ACUTE ONLY): 30 min  Charges:  $Therapeutic Exercise: 8-22 mins $Neuromuscular Re-education: 8-22 mins                     Raymond Gurney, PT, DPT Acute Rehabilitation Services  Pager: [phone] Office: [phone]    Jewel Baize 07/13/2020, 3:26 PM

## 2020-07-13 NOTE — Progress Notes (Signed)
[facility]  Subjective  -extubated.  In restraints.   Objective 119/83 (!) 117 100 F (37.8 C) (Oral) (!) 25 94%  Intake/Output Summary (Last 24 hours) at 07/13/2020 0841 Last data filed at 07/13/2020 0700 Gross per 24 hour  Intake 2045.92 ml  Output 2550 ml  Net -504.08 ml    Right radial and ulnar signal brisk by Doppler Some superficial dehiscence of right antecubital incision  Laboratory Lab Results: Recent Labs    07/12/20 0500 07/13/20 0517  WBC 9.2 16.2*  HGB 7.6* 8.5*  HCT 29.0* 31.8*  PLT 295 432*   BMET Recent Labs    07/12/20 0500 07/13/20 0517  NA 141 143  K 3.8 3.4*  CL 108 108  CO2 29 26  GLUCOSE 117* 178*  BUN 7 10  CREATININE 0.52 0.57  CALCIUM 8.3* 8.9    COAG Lab Results  Component Value Date   INR 1.2 07/04/2020   No results found for: PTT  Assessment/Planning:  35 year old female status post right upper extremity thrombectomy last week.  She still has very brisk Doppler flow in the right radial and ulnar arteries at the wrist.  Bedside nurse noted she has had some pain in the thumb and certainly could have embolized to the hand.  Overall the hand appears viable.  Clean dry dressing to the antecubital incision where she has had some superficial dehiscence  Cephus Shelling 07/13/2020 8:41 AM --

## 2020-07-13 NOTE — Progress Notes (Signed)
TOC team continues to follow for disposition needs.   Ary Lavine LCSW

## 2020-07-13 NOTE — Progress Notes (Addendum)
STROKE TEAM PROGRESS NOTE   INTERVAL HISTORY Patient was extubated yesterday and seems to be breathing satisfactorily but remains tachycardic with a heart rate in 110-120 range.  White count is slightly elevated today but she has been afebrile.  Blood pressure adequately controlled.     Serum sodium is slightly improved to 143.  Potassium is low at 3.4.  No family at the bedside.  Chest x-ray shows slight atelectasis.  Speech therapy doing bedside swallow eval and patient aspirating even on ice chips. NeuroSurgery and CCM are following Vitals:   07/13/20 1200 07/13/20 1300 07/13/20 1400 07/13/20 1500  BP: (!) 124/94 128/77 124/83 138/71  Pulse: (!) 116 (!) 113 (!) 128 (!) 110  Resp: 19 (!) 30 (!) 32 (!) 32  Temp: 99.6 F (37.6 C)     TempSrc: Oral     SpO2: 96% 94% (!) 87% 97%  Weight:      Height:       CBC:  Recent Labs  Lab 07/12/20 0500 07/13/20 0517  WBC 9.2 16.2*  HGB 7.6* 8.5*  HCT 29.0* 31.8*  MCV 76.5* 78.3*  PLT 295 432*   Basic Metabolic Panel:  Recent Labs  Lab 07/09/20 0553 07/09/20 1319 07/10/20 0621 07/12/20 0500 07/13/20 0517  NA 156*   < > 150* 141 143  K 3.4*  --  3.4* 3.8 3.4*  CL 118*  --  112* 108 108  CO2 31  --  32 29 26  GLUCOSE 160*  --  109* 117* 178*  BUN 9  --  7 7 10   CREATININE 0.49  --  0.49 0.52 0.57  CALCIUM 8.7*  --  8.2* 8.3* 8.9  MG 1.9  --  1.9  --   --   PHOS 4.3  --  4.3  --   --    < > = values in this interval not displayed.   Lipid Panel:  Recent Labs  Lab 07/08/20 0500  TRIG 82   HgbA1c: No results for input(s): HGBA1C in the last 168 hours. Urine Drug Screen: No results for input(s): LABOPIA, COCAINSCRNUR, LABBENZ, AMPHETMU, THCU, LABBARB in the last 168 hours.  Alcohol Level No results for input(s): ETH in the last 168 hours.  IMAGING past 24 hours DG Chest 1 View  Result Date: 07/13/2020 CLINICAL DATA:  Status post extubation EXAM: CHEST  1 VIEW COMPARISON:  July 07, 2020 FINDINGS: Endotracheal tube has been  removed. Enteric tube tip is below the diaphragm. Central catheter tip is in the right atrium. No pneumothorax. There is atelectatic change in the left perihilar region. Lungs elsewhere are clear. Heart size and pulmonary vascularity normal. No adenopathy. No bone lesions. IMPRESSION: Tube and catheter positions as described without pneumothorax. Left perihilar atelectasis. Lungs elsewhere clear. Heart size normal. Electronically Signed   By: July 09, 2020 III M.D.   On: 07/13/2020 07:59    PHYSICAL EXAM Blood pressure 138/71, pulse (!) 110, temperature 99.6 F (37.6 C), temperature source Oral, resp. rate (!) 32, height 5\' 5"  (1.651 m), weight 81.3 kg, SpO2 97 %.  General: Drowsy but awakens easily., caucasian female, no apparent distress.  Right craniotomy bony defect with staples.  Eyes partially swollen but now able to open spontaneously  Lungs: Symmetrical Chest rise, no labored breathing  Cardio: Regular Rate and Rhythm  Abdomen: Soft, non-tender  Neurological Exam : : Slightly drowsy but arouses easily but opens eyes., Able to follow right body commands without difficulty. Cranial Nerves: II:  Visual  fields grossly normal, pupils equal, round, reactive to light and accommodation III,IV, VI: ptosis not present, has Left gaze palsy with right gaze preference V,VII: swelling still present but face otherwise symetric VIII: hearing normal bilaterally XII: midline tongue extension without atrophy or fasciculations  Motor: Right : Upper extremity   5/5    Left:     Upper extremity   0/5  Lower extremity   5/5     Lower extremity   0/5 Tone and bulk:normal tone throughout; no atrophy noted   ASSESSMENT/PLAN Brandi Myers is a 35 y.o. female with history of uncontrolled diabetesandhypertensionpresenting with left sided weakness and slurred speech.Per family there is a complicated social situation at this time. She lives intermittently in an apartment by herself but was  kicked out and they went to pick her up at 5:30 AM. At that point she seemed "off"and generally quiet but did not seem to have any difficulty with left-sided weakness.Family attributed this to psychosocial issues and potential substance use noting that her sister passed away from an overdose in October. Later in the morning she complained of diffuse body pain and was administered gabapentin and a muscle relaxer.At 11:30 AM she stood up and walkedto the showerand at1215 she was found on the bottom of the tub with slurred speech and difficulty moving the left side. She was not preactivated by EMS, but on arrival in the ED clearly appear to have a doorway examination consistent with a right MCA territory infarct.CTA revealed right ICA occlusion as well as a right common carotid bifurcation thrombus extending into the external carotid (felt to be the source of the occlusion). CT perfusion imaging demonstrated possible core infarct of 51 mL (though notably this was obtained within 2 hours of symptom onset and therefore may reflect an overestimate of the true core). Mismatch ratio was 2 with a penumbra of 63 mL. Case was discussed with Dr. Corliss Skains of neuro interventional radiology and Dr. Otelia Limes of [facility] and the patient was accepted for transfer to [facility] for emergent intervention. She was also started on TPA after consenting the patient's emergency contact with full discussion of risks of bleeding and importance of accurately determining the patient's last known well.[name] confirm the last known well as described above and agreed that the patient would want TPA given the risk/benefit.She confirmed that the patient had no recent procedures or other contraindications to TPA.Regarding her baseline, she is functional at baseline and family believes she is smoking at least1pack a day currently. She is on Metformin for her diabetes, does not take oral contraceptive pills, has had no  recent surgeries or procedures, has not had any complaints of bleeding and family believes that she would have informed them of any significant bleeding.   She continues to improve ad be more purposeful in her movements. In discussion with CCM feel it is appropriate now to attempt extubation. The swelling in her Right scalp and eyelid has reduced to the point that she is able to open her Right eyelid but it is still difficult. Will continue to monitor.   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   CTA head and neck showed right ICA terminal and proximal MCA occlusion, right CCA nonocclusive thrombus.  CT perfusion showed large penumbra.  Status post thrombectomy with right terminal ICA, right PCA and right ACA TICI2breperfusion however does have distal micro emboli.  MRI showed large right MCA infarct with 2mm MLS and hemorrhagic transformation.   MRA showed patent intracranial vessels.  CT Head WO Contrast 3/26 620 - Improved midline shift and normalized left lateral ventricular volume after decompressive craniectomy. No progressive infarct or hemorrhage.  2D Echo- EF: 60-65%. No wall motion abnormality.  TCD Bubble - No PFO  Consider TEE once stable  DVT US -No evidence of DVT bilaterally  LDL99  HgbA1c10.6  VTE prophylaxis -Lovenox  No antithromboticprior to admission, now on No antithromboticdue to Hemorrhagic conversionand S/P Hemicraniotomy  Therapy recommendations:Pending  Disposition:Pending  Cerebral edema  CT Headrepeat3/24 -Continued interval evolution of right MCA distribution infarct with associated hemorrhagic transformation. Worsened regional mass effect and edema  with increased right-to-left midline shift now measuring up to 9 mm.Increased dilatation of the right lateral ventricle concerning for developing ventricular trapping. No other new acute intracranial abnormality.  CT repeat 3/25 unchangedright cerebral cytotoxic edema with central hemorrhage. Midline shift is similar at 1 cm.Unchanged left lateral ventricle dilatation with evidence of transependymal flow.  S/pdecompressive hemicraniectomywith Dr. Venetia Maxon  CT Head WO Contrast 3/26 620 - Improved midline shift and normalized left lateral ventricular volume after decompressive craniectomy. No progressive infarct or hemorrhage.  On keppra  3% salineoff  S/p PICC line  Na goal 150-155  Na 137->141->147->150>158>150> 141  Fever, central origin?  Tmax 101.7->102.5> 98.6> 98.7  WBC - yesterday was 8.9 repeat is pending.  Consider TEE once stable to rule out cardioembolic source  UA neg  CXR-Interval placement of a left upper extremity PICC with tip overlying the superior cavoatrial junction. No visible pneumothorax. Interval advancement of the endotracheal tube with tip now overlying the distal thoracic trachea.  Respiratory Culture - Strep Pneumoniae  B CxNo growth after5days drawn 3/25  Arctic sun stopped  On Ancef  Respiratory failure  Intubated on vent  CCM on board  CXR-Interval placement of a left upper extremity PICC with tip overlying the superior cavoatrial junction. No visible pneumothorax. Interval advancement of the endotracheal tube with tip now overlying the distal thoracic trachea.  Respiratory Culture - Strep Pneumoniae  Hyperlipidemia  Home meds:none  LDL 99, goal < 70  On lipitor 40 now  Continue statin at discharge  Diabetes type II Uncontrolled  Home meds:Metformin, Glargine, Aspart  HgbA1c 10.6, goal < 7.0  CBGs  SSI  Cocaine abuse  UDS positive for cocaine  Cessation education will be provided  Tobacco  abuse  Current smoker  Smoking cessation counselingwill beprovided  Other Stroke Risk Factors  Substance abuse - UDS: THC POSITIVEWill advise patientto stop using due to stroke risk.  Other Active Problems  Sick thyroid?- TSH: 0.307 (Low) Free T4:1.27 (high)   Blood Cultures draw 3/25No growth after 5days  Respiratory Culture - Strep Pneumoniae on Ceftriaxone 2 g daily   Hospital day # 9   Continue mobilization out of bed and  ongoing therapies.  We will watch her tachycardia and elevated white count for now.  Discussed with critical care medicine and if she increases work of breathing or gets worse she may need Chand  critical care medicine.  No family at the bedside This patient is critically ill and at significant risk of neurological worsening, death and care requires constant monitoring of vital signs, hemodynamics,respiratory and cardiac monitoring, extensive review of multiple databases, frequent neurological assessment, discussion with family, other specialists and medical  decision making of high complexity.I have made any additions or clarifications directly to the above note.This critical care time does not reflect procedure time, or teaching time or supervisory time of PA/NP/Med Resident etc but could involve care discussion time.  I spent 30 minutes of neurocritical care time  in the care of  this patient.      Delia HeadyPramod Tavarius Grewe, MD Medical Director [facility] Pager: [phone] 07/13/2020 3:26 PM   To contact Stroke Continuity provider, please refer to [email or web address]. After hours, contact General Neurology

## 2020-07-13 NOTE — Evaluation (Signed)
Clinical/Bedside Swallow Evaluation Patient Details  Name: Brandi Myers MRN: [id number] Date of Birth: 1985/04/15  Today's Date: 07/13/2020 Time: SLP Start Time (ACUTE ONLY): 0947 SLP Stop Time (ACUTE ONLY): 0957 SLP Time Calculation (min) (ACUTE ONLY): 10 min  Past Medical History:  Past Medical History:  Diagnosis Date  . Diabetes mellitus without complication (HCC)   . Hypertension    Past Surgical History:  Past Surgical History:  Procedure Laterality Date  . CESAREAN SECTION    . CHOLECYSTECTOMY    . CRANIOTOMY Right 07/06/2020   Procedure: RIGHT CRANIECTOMY WITH PLACEMENT OF BONE FLAP IN ABDOMEN;  Surgeon: Maeola Harman, MD;  Location: [facility] OR;  Service: Neurosurgery;  Laterality: Right;  . IR PERCUTANEOUS ART THROMBECTOMY/INFUSION INTRACRANIAL INC DIAG ANGIO  07/04/2020  . RADIOLOGY WITH ANESTHESIA N/A 07/04/2020   Procedure: IR WITH ANESTHESIA;  Surgeon: Radiologist, Medication, MD;  Location: [facility] OR;  Service: Radiology;  Laterality: N/A;  . THROMBECTOMY BRACHIAL ARTERY Right 07/05/2020   Procedure: RIGHT UPPER EXTREMITY THROMBECTOMY;  Surgeon: Cephus Shelling, MD;  Location: [facility] OR;  Service: Vascular;  Laterality: Right;   HPI:  35 yo female admitted with L sided weakness, given TPA for R MCA involving basal ganglia, frontal, temporal lobes  3/23 s/p BIL revascularization  R MCA ACA achieving 2B revascularization. Pt intubated 3/23-31. 3/24 R UE thrombectomy. CT revealed worsening midline shift. 3/25 decompressive hemicranitectomy, R side bone flap in abdomen. PMH current smoker uncontrolled DM HTn   Assessment / Plan / Recommendation Clinical Impression  Pt participated in preliminary swallowing assessment. Had just received oral care per RN. Lesion is present on superior surface of tongue; pt identifies roof of mouth as painful during suctioning.  There is decreased sensation/awareness left oral cavity, with loss of ice chip and poor recognition of its presence in left  lateral sulcus. Pt required max verbal cues to attend to, masticate, and swallow ice chip boluses.  There appeared to be a significant delay and swallow was difficult to palpate. When the swallow did occur,  it was followed by explosive coughing on 100% of trials.  Pt is not yet ready for POs given sensorimotor deficits and high aspiration risk.  Recommend continuing cortrak for nutrition; NPO; SLP will follow closely for readiness instrumental swallow study. SLP Visit Diagnosis: Dysphagia, oropharyngeal phase (R13.12)    Aspiration Risk  Severe aspiration risk    Diet Recommendation    npo      Other  Recommendations Oral Care Recommendations: Oral care QID   Follow up Recommendations Inpatient Rehab      Frequency and Duration min 2x/week  2 weeks       Prognosis Prognosis for Safe Diet Advancement: Good      Swallow Study   General HPI: 35 yo female admitted with L sided weakness, given TPA for R MCA involving basal ganglia, frontal, temporal lobes  3/23 s/p BIL revascularization  R MCA ACA achieving 2B revascularization. Pt intubated 3/23-31. 3/24 R UE thrombectomy. CT revealed worsening midline shift. 3/25 decompressive hemicranitectomy, R side bone flap in abdomen. PMH current smoker uncontrolled DM HTn Type of Study: Bedside Swallow Evaluation Previous Swallow Assessment: no Diet Prior to this Study: NPO;NG Tube Temperature Spikes Noted: No Respiratory Status: Nasal cannula History of Recent Intubation: Yes Length of Intubations (days): 8 days Date extubated: 07/12/20 Behavior/Cognition: Alert Oral Cavity Assessment: Lesions Oral Care Completed by SLP: Yes Oral Cavity - Dentition: Poor condition Vision: Impaired for self-feeding Self-Feeding Abilities: Total assist Patient Positioning: Upright  in bed Baseline Vocal Quality: Aphonic Volitional Cough: Weak;Wet Volitional Swallow: Able to elicit    Oral/Motor/Sensory Function Overall Oral Motor/Sensory Function:  Moderate impairment Facial Strength: Reduced left;Suspected CN VII (facial) dysfunction Facial Sensation: Reduced left;Suspected CN V (Trigeminal) dysfunction   Ice Chips Ice chips: Impaired Presentation: Spoon Oral Phase Impairments: Reduced labial seal;Poor awareness of bolus Oral Phase Functional Implications: Left anterior spillage;Prolonged oral transit Pharyngeal Phase Impairments: Suspected delayed Swallow;Cough - Delayed   Thin Liquid Thin Liquid: Not tested    Nectar Thick Nectar Thick Liquid: Not tested   Honey Thick Honey Thick Liquid: Not tested   Puree Puree: Not tested   Solid     Solid: Not tested      Blenda Mounts Laurice 07/13/2020,10:41 AM   Marchelle Folks L. Samson Frederic, MA CCC/SLP Acute Rehabilitation Services Office number [phone] Pager [phone]

## 2020-07-14 ENCOUNTER — Inpatient Hospital Stay (HOSPITAL_COMMUNITY): Payer: Medicaid Other

## 2020-07-14 DIAGNOSIS — J15211 Pneumonia due to Methicillin susceptible Staphylococcus aureus: Secondary | ICD-10-CM

## 2020-07-14 DIAGNOSIS — J95851 Ventilator associated pneumonia: Secondary | ICD-10-CM | POA: Diagnosis not present

## 2020-07-14 DIAGNOSIS — Z9889 Other specified postprocedural states: Secondary | ICD-10-CM

## 2020-07-14 DIAGNOSIS — I6601 Occlusion and stenosis of right middle cerebral artery: Secondary | ICD-10-CM | POA: Diagnosis not present

## 2020-07-14 DIAGNOSIS — I6389 Other cerebral infarction: Secondary | ICD-10-CM | POA: Diagnosis not present

## 2020-07-14 DIAGNOSIS — G936 Cerebral edema: Secondary | ICD-10-CM | POA: Diagnosis not present

## 2020-07-14 LAB — GLUCOSE, CAPILLARY
Glucose-Capillary: 160 mg/dL — ABNORMAL HIGH (ref 70–99)
Glucose-Capillary: 223 mg/dL — ABNORMAL HIGH (ref 70–99)
Glucose-Capillary: 257 mg/dL — ABNORMAL HIGH (ref 70–99)
Glucose-Capillary: 109 mg/dL — ABNORMAL HIGH (ref 70–99)
Glucose-Capillary: 139 mg/dL — ABNORMAL HIGH (ref 70–99)
Glucose-Capillary: 192 mg/dL — ABNORMAL HIGH (ref 70–99)

## 2020-07-14 LAB — BASIC METABOLIC PANEL
Anion gap: 12 (ref 5–15)
BUN: 9 mg/dL (ref 6–20)
CO2: 22 mmol/L (ref 22–32)
Calcium: 9 mg/dL (ref 8.9–10.3)
Chloride: 105 mmol/L (ref 98–111)
Creatinine, Ser: 0.57 mg/dL (ref 0.44–1.00)
GFR, Estimated: >60 mL/min (ref >60)
Glucose, Bld: 187 mg/dL — ABNORMAL HIGH (ref 70–99)
Potassium: 3.8 mmol/L (ref 3.5–5.1)
Sodium: 139 mmol/L (ref 135–145)

## 2020-07-14 LAB — CBC
HCT: 32.7 % — ABNORMAL LOW (ref 36.0–46.0)
Hemoglobin: 9 g/dL — ABNORMAL LOW (ref 12.0–15.0)
MCH: 21 pg — ABNORMAL LOW (ref 26.0–34.0)
MCHC: 27.5 g/dL — ABNORMAL LOW (ref 30.0–36.0)
MCV: 76.4 fL — ABNORMAL LOW (ref 80.0–100.0)
Platelets: 495 10*3/uL — ABNORMAL HIGH (ref 150–400)
RBC: 4.28 MIL/uL (ref 3.87–5.11)
RDW: 28.3 % — ABNORMAL HIGH (ref 11.5–15.5)
WBC: 15.7 10*3/uL — ABNORMAL HIGH (ref 4.0–10.5)
nRBC: 0.3 % — ABNORMAL HIGH (ref 0.0–0.2)

## 2020-07-14 MED ORDER — LACTATED RINGERS IV BOLUS
1000.0000 mL | Freq: Once | INTRAVENOUS | Status: AC
  Administered 2020-07-14: 1000 mL via INTRAVENOUS

## 2020-07-14 MED ORDER — INSULIN DETEMIR 100 UNIT/ML ~~LOC~~ SOLN
35.0000 [IU] | Freq: Two times a day (BID) | SUBCUTANEOUS | Status: DC
  Administered 2020-07-14 – 2020-07-17 (×6): 35 [IU] via SUBCUTANEOUS
  Filled 2020-07-14 (×9): qty 0.35

## 2020-07-14 NOTE — Progress Notes (Signed)
Stable, incision clean dry and intact, densely left hemiparetic, awake and somewhat conversant.  This seems to be stable.  Continue current management

## 2020-07-14 NOTE — Progress Notes (Addendum)
STROKE TEAM PROGRESS NOTE   INTERVAL HISTORY  CT head this morning shows evolving right MCA infarct with slight improvement of swelling and midline shift.  Afebrile.  Remains tachycardic with HR between 100-112.  WBC 15.7.  Urinalysis was negative.     She is more awake this am.  Oriented to person, year, and month.  PERRL, right gaze preference, left facial droop, FC on right side 4/5 strength. LU flaccid, and left lower minimal spontaneous movement.   NeuroSurgery and CCM are following Vitals:   07/14/20 0900 07/14/20 1000 07/14/20 1100 07/14/20 1200  BP: 138/80 110/72 108/87   Pulse: (!) 109 (!) 114 (!) 103 (!) 110  Resp: 16 (!) 22 (!) 25 (!) 32  Temp:      TempSrc:      SpO2: 97% 96% 96% 98%  Weight:      Height:       CBC:  Recent Labs  Lab 07/13/20 0517 07/14/20 0558  WBC 16.2* 15.7*  HGB 8.5* 9.0*  HCT 31.8* 32.7*  MCV 78.3* 76.4*  PLT 432* 495*   Basic Metabolic Panel:  Recent Labs  Lab 07/09/20 0553 07/09/20 1319 07/10/20 0621 07/12/20 0500 07/13/20 0517 07/14/20 0558  NA 156*   < > 150*   < > 143 139  K 3.4*  --  3.4*   < > 3.4* 3.8  CL 118*  --  112*   < > 108 105  CO2 31  --  32   < > 26 22  GLUCOSE 160*  --  109*   < > 178* 187*  BUN 9  --  7   < > 10 9  CREATININE 0.49  --  0.49   < > 0.57 0.57  CALCIUM 8.7*  --  8.2*   < > 8.9 9.0  MG 1.9  --  1.9  --   --   --   PHOS 4.3  --  4.3  --   --   --    < > = values in this interval not displayed.   Lipid Panel:  Recent Labs  Lab 07/08/20 0500  TRIG 82   HgbA1c: No results for input(s): HGBA1C in the last 168 hours. Urine Drug Screen: No results for input(s): LABOPIA, COCAINSCRNUR, LABBENZ, AMPHETMU, THCU, LABBARB in the last 168 hours.  Alcohol Level No results for input(s): ETH in the last 168 hours.  IMAGING past 24 hours CT HEAD WO CONTRAST  Result Date: 07/14/2020  IMPRESSION:  1. Evolving right MCA territory infarct with slightly decreased swelling and midline shift.  2. Improved sinus  inflammation. 3. No new abnormality.    PHYSICAL EXAM Blood pressure 108/87, pulse (!) 110, temperature 98.2 F (36.8 C), temperature source Oral, resp. rate (!) 32, height 5\' 5"  (1.651 m), weight 76.6 kg, SpO2 98 %.  General: Drowsy but awakens easily., caucasian female, no apparent distress.  Right craniotomy bony defect with staples.  Eyes partially swollen but now able to open spontaneously.  Lungs: Symmetrical Chest rise, no labored breathing  Cardio: Regular Rate and Rhythm  Abdomen: Soft, non-tender  Neurological Exam : Awake, opens eyes., Able to follow commands on the right side without difficulty. Cranial Nerves: II:  Visual fields grossly normal, pupils equal, round, reactive to light and accommodation, inconsistent blinking to threat III,IV, VI: ptosis not present, has left gaze palsy with right gaze preference V,VII: swelling still present but face otherwise symetric VIII: hearing normal bilaterally XII: midline tongue extension without  atrophy or fasciculations  Motor: Right Upper extremity   5/5     Left Upper extremity   0/5 Right Lower extremity   5/5      Left Lower extremity   1/5 Tone and bulk:normal tone throughout; no atrophy noted   ASSESSMENT/PLAN Brandi Myers is a 35 y.o. female with history of uncontrolled diabetesandhypertensionpresenting with left sided weakness and slurred speech.Per family there is a complicated social situation at this time. She lives intermittently in an apartment by herself but was kicked out and they went to pick her up at 5:30 AM. At that point she seemed "off"and generally quiet but did not seem to have any difficulty with left-sided weakness.Family attributed this to psychosocial issues and potential substance use noting that her sister passed away from an overdose in October. Later in the morning she complained of diffuse body pain and was administered gabapentin and a muscle relaxer.At 11:30 AM she stood up and  walkedto the showerand at1215 she was found on the bottom of the tub with slurred speech and difficulty moving the left side. She was not preactivated by EMS, but on arrival in the ED clearly appear to have a doorway examination consistent with a right MCA territory infarct.CTA revealed right ICA occlusion as well as a right common carotid bifurcation thrombus extending into the external carotid (felt to be the source of the occlusion). CT perfusion imaging demonstrated possible core infarct of 51 mL (though notably this was obtained within 2 hours of symptom onset and therefore may reflect an overestimate of the true core). Mismatch ratio was 2 with a penumbra of 63 mL. Case was discussed with Dr. Corliss Myers of neuro interventional radiology and [facility] and [facility]. She was also started on TPA after consenting the patient's emergency contact with full discussion of risks of bleeding and importance of accurately determining the patient's last known well.Ms. Brandi PerchvaSmall confirm the last known well as described above and agreed that the patient would want TPA given the risk/benefit.She confirmed that the patient had no recent procedures or other contraindications to TPA.Regarding her baseline, she is functional at baseline and family believes she is smoking at least1pack a day currently. She is on Metformin for her diabetes, does not take oral contraceptive pills, has had no recent surgeries or procedures.   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   CT no acute finding but suspicious for right MCA early ischemic  changes.   CTA head and neck showed right ICA terminal and proximal MCA occlusion, right CCA nonocclusive thrombus.  CT perfusion showed large penumbra.   Status post thrombectomy with right terminal ICA, right PCA and right ACA TICI2breperfusion however does have distal micro emboli.  MRI showed large right MCA infarct with 6mm MLS and hemorrhagic transformation.   MRA showed patent intracranial vessels.  CT Head WO Contrast 3/26 620 - Improved midline shift and normalized left lateral ventricular volume after decompressive craniectomy. No progressive infarct or hemorrhage.  2D Echo- EF: 60-65%. No wall motion abnormality.  TCD Bubble - No PFO  Consider TEE once stable  DVT US -No evidence of DVT bilaterally  LDL99  HgbA1c10.6  VTE prophylaxis -Lovenox  No  antithromboticprior to admission, now on Aspirin 81mg  daily  Therapy recommendations:PT: CIR, Speech CIR, OT pending  Disposition:Pending  Cerebral edema  CT Headrepeat3/24 -Continued interval evolution of right MCA distribution infarct with associated hemorrhagic transformation. Worsened regional mass effect and edema with increased right-to-left midline shift now measuring up to 9 mm.Increased dilatation of the right lateral ventricle concerning for developing ventricular trapping. No other new acute intracranial abnormality.  CT repeat 3/25 unchangedright cerebral cytotoxic edema with central hemorrhage. Midline shift is similar at 1 cm.Unchanged left lateral ventricle dilatation with evidence of transependymal flow.  S/pdecompressive hemicraniectomy (3/25)with Dr. 4/25  CT Head WO Contrast 3/26 620 - Improved midline shift and normalized left lateral ventricular volume after decompressive craniectomy. No progressive infarct or hemorrhage.  On keppra 500 mg bid  3% salineoff  Na 137->141->147->150>158>150> 141>139  Fever, resolved Leukocytosis  Tmax 101.7->102.5> 98.6>  98.7>99.8  WBC - 16.2>15.7  Consider TEE once stable to rule out cardioembolic source  UA neg  CXR-left perihilar atelectasis  Respiratory Culture (3/26) - Strep Pneumoniae  B CxNo growth after5days drawn 3/25  Completed 7 day course of Ceftriaxone on 4/2  Tachycardia and tachypnea  Discussed with CCM Dr. 4/25  Concerning for aspiration pneumonia  LE venous doppler repeat pending  May consider CTA per PE protocol if concerning for PE  R limb critical limb ischemia  S/p thrombectomy  Vascular surgery following  Hyperlipidemia  Home meds:none  LDL 99, goal < 70  On lipitor 40 now  Continue statin at discharge  Poorly controlled Diabetes type II, with hyperglycemia   Home meds:Metformin, Glargine, Aspart  HgbA1c 10.6, goal < 7.0  CBGs  SSI  Increase Levemir to 35 units bid  Novolog 6 units every 4 hours  Hypokalemia  K 3.4  Replete with Potassium Chloride June x1  Cocaine abuse  UDS positive for cocaine  Cessation education will be provided  Tobacco abuse  Current smoker  Smoking cessation counselingwill beprovided  Other Stroke Risk Factors  Substance abuse - UDS: THC POSITIVEWill advise patientto stop using due to stroke risk.  Other Active Problems  Sick thyroid?- TSH: 0.307 (Low) Free T4:1.27 (high)   Blood Cultures draw 3/25No growth after 5days  Respiratory Culture - Strep Pneumoniae on Ceftriaxone 2 g daily   Hospital day # 10    Brandi Myers, ACNP-BC Stroke NP 07/14/20 2:00pm  ATTENDING NOTE: I reviewed above note and agree with the assessment and plan. Pt was seen and examined.   Patient lying in bed, neuro stable, still follow simple commands and able to answer simple questions with dysarthric voice.  Still has left hemiplegia.  Did not have swallow, on tube feeding.  Continue to have tachycardia and tachypnea.  Discussed with Dr. 09/13/20 CCM, concerning for aspiration.  Will  also check LE venous Doppler to rule out DVT.  May consider CTA chest PE protocol if concerning for PE.  For detailed assessment and plan, please refer to above as I have made changes wherever appropriate.   Merrily Pew, MD PhD Stroke Neurology 07/14/2020 8:28 PM   This patient is critically ill due to large right MCA stroke status post decompressive hemicraniotomy, left hemiplegia, dysphagia, tachycardia and tachypnea and at significant risk of neurological worsening, death form cerebral edema, hemorrhagic conversion, brain herniation, heart failure, sepsis, PE. This patient's care requires constant monitoring of vital signs, hemodynamics, respiratory and cardiac monitoring, review of multiple databases, neurological assessment, discussion with family, other specialists and medical decision making of high complexity. I  spent 35 minutes of neurocritical care time in the care of this patient.    To contact Stroke Continuity provider, please refer to [email or web address]. After hours, contact General Neurology

## 2020-07-14 NOTE — Plan of Care (Signed)
  Problem: Nutrition: Goal: Adequate nutrition will be maintained Outcome: Progressing   

## 2020-07-14 NOTE — Progress Notes (Signed)
NAME:  Brandi Myers, MRN:  [id number], DOB:  [birth year], LOS: 10 ADMISSION DATE:  07/04/2020, CONSULTATION DATE:  07/04/2020 REFERRING MD:  Dr. Corliss Skains, CHIEF COMPLAINT:  Vent management    History of Present Illness:  Brandi Myers is a 35 y.o. with PMH significant for HTN and type 2 diabetes who presented as a code stroke due to left sided weakness and slurred speech. Head CT demonstrated right ICA occlusion as well as right common carotid bifurcation thrombus resulting in acute stroke. Patient received TPA in ED at [facility]. Transferred to [facility] where she underwent revascularization of occluded terminal ICA and right MCA and right ACA. Patient remained intubated and was transferred to neuro ICU. Critical care consulted for ventilator management.  Pertinent  Medical History   Uncontrolled diabetes and hypertension  Significant Hospital Events: Including procedures, antibiotic start and stop dates in addition to other pertinent events   .  3/23  Admitted as a code stroke and found to have occluded right ICA resulting in stroke.  Underwent IR revascularization and remained intubated post procedure . 3/24 R brachial clot requiring thrombectomy, hemorrhagic transformation on MRI and worsening midline shift on imaging . 3/25 NSGY decompressive craniectomy, TF started . 3/26 fevers post op, holding abx, SBT/SAT . 3/27 Tracheal aspirate with S.aureus and strep pneumo- started CTX and vanc . 3/28 Stopped vanc . 3/31 extubated  Interim History / Subjective:  Remains tachycardic and lethargic Remain on room air   Objective   Blood pressure (!) 125/99, pulse (!) 113, temperature 98.2 F (36.8 C), temperature source Oral, resp. rate (!) 28, height 5\' 5"  (1.651 m), weight 76.6 kg, SpO2 98 %.        Intake/Output Summary (Last 24 hours) at 07/14/2020 1136 Last data filed at 07/14/2020 0700 Gross per 24 hour  Intake 1860.18 ml  Output 1000 ml  Net 860.18 ml   Filed Weights   07/12/20  0500 07/13/20 0500 07/14/20 0400  Weight: 81 kg 81.3 kg 76.6 kg    Examination: General: Young [name], lying on the bed HEENT: Status post craniotomy, scar looks clean, pupils bilateral equal round react light, cortrak in place Cardiovascular: Tachycardic, regular rhythm, no murmur or gallop Respiratory: Reduced air entry at the bases, no wheezes or rhonchi Gastrointestinal: Soft, nontender, nondistended bowel sounds present Skin: No rashes, normal turgor.  Neurologic: Awake, following simple commands, rightward gaze deviation, moving right side, plegic left side  Resolved Hospital Problem list   Acute hypoxic respiratory failure due to acute stroke and cerebral edema  Assessment & Plan:  Acute right MCA/ACA territory stroke with vasogenic cerebral edema status post thrombectomy S/p decompressive craniectomy 3/25 Repeat head CT shows decreasing edema but evolving stroke Stroke team is following Continue secondary stroke prophylaxis  MSSA and strep pneumonia Continue CTX 2g daily (day 7 of 7)  Poorly controlled diabetes type 2, with hyperglycemia Blood sugars are better controlled now Continue levemir + TF coverage; goal CBG 140-180  Anemia of critical illness H&H is stable, continue to monitor  R limb critical limb ischemia S/p thrombectomy, site looks good Appreciate vascular surgery follow-up  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, hx polysubstance abuse Tobacco abuse Watch for signs of withdrawal  Best practice (evaluated daily)  Diet: Tube feeds via Cortrak Pain/Anxiety/Delirium protocol (if indicated): N/A VAP protocol (if indicated): No DVT prophylaxis: LMWH GI prophylaxis: PPI Glucose control:  Basal + SSI Central venous access:  N/A Arterial line:  N/A Foley:  No  Mobility: Out of  bed to chair PT consulted: Yes Last date of multidisciplinary goals of care discussion: per primary Code Status:  full code Disposition: ICU    Total critical care time: 36  minutes  Performed by: Cheri Fowler   Critical care time was exclusive of separately billable procedures and treating other patients.   Critical care was necessary to treat or prevent imminent or life-threatening deterioration.   Critical care was time spent personally by me on the following activities: development of treatment plan with patient and/or surrogate as well as nursing, discussions with consultants, evaluation of patient's response to treatment, examination of patient, obtaining history from patient or surrogate, ordering and performing treatments and interventions, ordering and review of laboratory studies, ordering and review of radiographic studies, pulse oximetry and re-evaluation of patient's condition.   Cheri Fowler MD [facility] See Amion for pager If no response to pager, please call [phone] until 7pm After 7pm, Please call E-link [phone]

## 2020-07-15 ENCOUNTER — Inpatient Hospital Stay (HOSPITAL_COMMUNITY): Payer: Medicaid Other

## 2020-07-15 ENCOUNTER — Encounter (HOSPITAL_COMMUNITY): Payer: Medicaid Other

## 2020-07-15 DIAGNOSIS — I63131 Cerebral infarction due to embolism of right carotid artery: Secondary | ICD-10-CM | POA: Diagnosis not present

## 2020-07-15 DIAGNOSIS — I70208 Unspecified atherosclerosis of native arteries of extremities, other extremity: Secondary | ICD-10-CM | POA: Diagnosis not present

## 2020-07-15 DIAGNOSIS — I998 Other disorder of circulatory system: Secondary | ICD-10-CM | POA: Diagnosis not present

## 2020-07-15 DIAGNOSIS — R000 Tachycardia, unspecified: Secondary | ICD-10-CM

## 2020-07-15 DIAGNOSIS — I639 Cerebral infarction, unspecified: Secondary | ICD-10-CM

## 2020-07-15 DIAGNOSIS — E1165 Type 2 diabetes mellitus with hyperglycemia: Secondary | ICD-10-CM

## 2020-07-15 DIAGNOSIS — R0682 Tachypnea, not elsewhere classified: Secondary | ICD-10-CM

## 2020-07-15 DIAGNOSIS — M62221 Nontraumatic ischemic infarction of muscle, right upper arm: Secondary | ICD-10-CM

## 2020-07-15 DIAGNOSIS — I6601 Occlusion and stenosis of right middle cerebral artery: Secondary | ICD-10-CM | POA: Diagnosis not present

## 2020-07-15 DIAGNOSIS — J15211 Pneumonia due to Methicillin susceptible Staphylococcus aureus: Secondary | ICD-10-CM | POA: Diagnosis not present

## 2020-07-15 LAB — CBC
HCT: 33.6 % — ABNORMAL LOW (ref 36.0–46.0)
Hemoglobin: 9.2 g/dL — ABNORMAL LOW (ref 12.0–15.0)
MCH: 21.1 pg — ABNORMAL LOW (ref 26.0–34.0)
MCHC: 27.4 g/dL — ABNORMAL LOW (ref 30.0–36.0)
MCV: 76.9 fL — ABNORMAL LOW (ref 80.0–100.0)
Platelets: 559 10*3/uL — ABNORMAL HIGH (ref 150–400)
RBC: 4.37 MIL/uL (ref 3.87–5.11)
RDW: 27.9 % — ABNORMAL HIGH (ref 11.5–15.5)
WBC: 16.8 10*3/uL — ABNORMAL HIGH (ref 4.0–10.5)
nRBC: 0.2 % (ref 0.0–0.2)

## 2020-07-15 LAB — HEPARIN LEVEL (UNFRACTIONATED): Heparin Unfractionated: 0.17 IU/mL — ABNORMAL LOW (ref 0.30–0.70)

## 2020-07-15 LAB — BASIC METABOLIC PANEL
Anion gap: 9 (ref 5–15)
BUN: 8 mg/dL (ref 6–20)
CO2: 26 mmol/L (ref 22–32)
Calcium: 9.2 mg/dL (ref 8.9–10.3)
Chloride: 105 mmol/L (ref 98–111)
Creatinine, Ser: 0.47 mg/dL (ref 0.44–1.00)
GFR, Estimated: >60 mL/min (ref >60)
Glucose, Bld: 134 mg/dL — ABNORMAL HIGH (ref 70–99)
Potassium: 3.8 mmol/L (ref 3.5–5.1)
Sodium: 140 mmol/L (ref 135–145)

## 2020-07-15 LAB — GLUCOSE, CAPILLARY
Glucose-Capillary: 132 mg/dL — ABNORMAL HIGH (ref 70–99)
Glucose-Capillary: 149 mg/dL — ABNORMAL HIGH (ref 70–99)
Glucose-Capillary: 263 mg/dL — ABNORMAL HIGH (ref 70–99)
Glucose-Capillary: 144 mg/dL — ABNORMAL HIGH (ref 70–99)
Glucose-Capillary: 135 mg/dL — ABNORMAL HIGH (ref 70–99)
Glucose-Capillary: 135 mg/dL — ABNORMAL HIGH (ref 70–99)

## 2020-07-15 LAB — URINALYSIS, ROUTINE W REFLEX MICROSCOPIC
Bilirubin Urine: NEGATIVE
Glucose, UA: NEGATIVE mg/dL
Hgb urine dipstick: NEGATIVE
Ketones, ur: NEGATIVE mg/dL
Leukocytes,Ua: NEGATIVE
Nitrite: NEGATIVE
Protein, ur: NEGATIVE mg/dL
Specific Gravity, Urine: 1.025 (ref 1.005–1.030)
pH: 6 (ref 5.0–8.0)

## 2020-07-15 MED ORDER — OXYCODONE-ACETAMINOPHEN 5-325 MG PO TABS
1.0000 | ORAL_TABLET | Freq: Four times a day (QID) | ORAL | Status: DC | PRN
  Administered 2020-07-17: 1 via ORAL
  Filled 2020-07-15: qty 1

## 2020-07-15 MED ORDER — HEPARIN (PORCINE) 25000 UT/250ML-% IV SOLN
2550.0000 [IU]/h | INTRAVENOUS | Status: DC
  Administered 2020-07-15: 750 [IU]/h via INTRAVENOUS
  Administered 2020-07-17: 1500 [IU]/h via INTRAVENOUS
  Administered 2020-07-18: 2100 [IU]/h via INTRAVENOUS
  Administered 2020-07-18: 1950 [IU]/h via INTRAVENOUS
  Administered 2020-07-19: 2150 [IU]/h via INTRAVENOUS
  Administered 2020-07-20: 2200 [IU]/h via INTRAVENOUS
  Administered 2020-07-21 (×2): 2400 [IU]/h via INTRAVENOUS
  Administered 2020-07-22: 2500 [IU]/h via INTRAVENOUS
  Administered 2020-07-22: 2450 [IU]/h via INTRAVENOUS
  Administered 2020-07-23 – 2020-07-24 (×3): 2500 [IU]/h via INTRAVENOUS
  Administered 2020-07-24: 2600 [IU]/h via INTRAVENOUS
  Administered 2020-07-25: 2550 [IU]/h via INTRAVENOUS
  Administered 2020-07-25: 2600 [IU]/h via INTRAVENOUS
  Filled 2020-07-15 (×23): qty 250

## 2020-07-15 NOTE — Progress Notes (Addendum)
STROKE TEAM PROGRESS NOTE   INTERVAL HISTORY  CT head shows evolving right MCA infarct with slight improvement of swelling and midline shift.   She is awake in bed.  Has R gaze preference, does not cross midline, inconsistent blinking to visual threat on the left, left facial droop, follows simple commands on the right side.  RUE/RLE spontaneous movement against gravity, LUE flaccid, LLE withdraw to pain.    Pain noted in right hand this morning.  Right fingers are cold to touch.  There is an area of dry eschar wound on the palmar region.  Upper extremity arterial duplex scan obtained which showed right ulnar artery occlusion.  Consulted with vascular surgery and the plan is to medically manage with Heparin infusion at this time.  Neurosurgery is okay with starting heparin infusion in the setting of ICH.    Vitals:   07/15/20 0300 07/15/20 0400 07/15/20 0500 07/15/20 0600  BP: 137/77 135/82 116/84 126/78  Pulse: (!) 110 (!) 112 (!) 111 (!) 110  Resp: (!) 33 (!) 21 (!) 26 (!) 27  Temp:  99.8 F (37.7 C)    TempSrc:  Oral    SpO2: 95% 95% 96% 96%  Weight:  71.3 kg    Height:       CBC:  Recent Labs  Lab 07/14/20 0558 07/15/20 0409  WBC 15.7* 16.8*  HGB 9.0* 9.2*  HCT 32.7* 33.6*  MCV 76.4* 76.9*  PLT 495* 559*   Basic Metabolic Panel:  Recent Labs  Lab 07/09/20 0553 07/09/20 1319 07/10/20 0621 07/12/20 0500 07/14/20 0558 07/15/20 0409  NA 156*   < > 150*   < > 139 140  K 3.4*  --  3.4*   < > 3.8 3.8  CL 118*  --  112*   < > 105 105  CO2 31  --  32   < > 22 26  GLUCOSE 160*  --  109*   < > 187* 134*  BUN 9  --  7   < > 9 8  CREATININE 0.49  --  0.49   < > 0.57 0.47  CALCIUM 8.7*  --  8.2*   < > 9.0 9.2  MG 1.9  --  1.9  --   --   --   PHOS 4.3  --  4.3  --   --   --    < > = values in this interval not displayed.   Lipid Panel:  No results for input(s): CHOL, TRIG, HDL, CHOLHDL, VLDL, LDLCALC in the last 168 hours. HgbA1c: No results for input(s): HGBA1C in the  last 168 hours. Urine Drug Screen: No results for input(s): LABOPIA, COCAINSCRNUR, LABBENZ, AMPHETMU, THCU, LABBARB in the last 168 hours.  Alcohol Level No results for input(s): ETH in the last 168 hours.  IMAGING past 24 hours CT HEAD WO CONTRAST  Result Date: 07/14/2020  IMPRESSION:  1. Evolving right MCA territory infarct with slightly decreased swelling and midline shift.  2. Improved sinus inflammation. 3. No new abnormality.    PHYSICAL EXAM Blood pressure 126/78, pulse (!) 110, temperature 99.8 F (37.7 C), temperature source Oral, resp. rate (!) 27, height 5\' 5"  (1.651 m), weight 71.3 kg, SpO2 96 %.  General: Drowsy but awakens easily., caucasian female, no apparent distress.  Right craniotomy bony defect with staples.  Eyes partially swollen but now able to open spontaneously.  Lungs: Symmetrical Chest rise, no labored breathing  Cardio: Regular Rate and Rhythm  Abdomen: Soft,  non-tender Skin: right hand pain to touch, right fingers cold, dry eschar wound on the palmar region.  Neurological Exam : Awake, opens eyes., Able to follow commands on the right side without difficulty. Cranial Nerves: II:  pupils equal, round, reactive to light and accommodation, not blinking to visual threat on the left III,IV, VI: ptosis not present, not cross midline, tracking on the right,  V,VII: left facial droop VIII: hearing normal bilaterally XII: midline tongue extension without atrophy or fasciculations  Motor: Right Upper extremity   5/5     Left Upper extremity   0/5 Right Lower extremity   5/5      Left Lower extremity   1/5 withdraw to pain Tone and bulk:normal tone throughout; no atrophy noted   ASSESSMENT/PLAN [name] is a 35 y.o. female with history of uncontrolled diabetesandhypertensionpresenting with left sided weakness and slurred speech.Per family there is a complicated social situation at this time. She lives intermittently in an apartment by herself  but was kicked out and they went to pick her up at 5:30 AM. At that point she seemed "off"and generally quiet but did not seem to have any difficulty with left-sided weakness.Family attributed this to psychosocial issues and potential substance use noting that her sister passed away from an overdose in October. Later in the morning she complained of diffuse body pain and was administered gabapentin and a muscle relaxer.At 11:30 AM she stood up and walkedto the showerand at1215 she was found on the bottom of the tub with slurred speech and difficulty moving the left side. She was not preactivated by EMS, but on arrival in the ED clearly appear to have a doorway examination consistent with a right MCA territory infarct.CTA revealed right ICA occlusion as well as a right common carotid bifurcation thrombus extending into the external carotid (felt to be the source of the occlusion). CT perfusion imaging demonstrated possible core infarct of 51 mL (though notably this was obtained within 2 hours of symptom onset and therefore may reflect an overestimate of the true core). Mismatch ratio was 2 with a penumbra of 63 mL. Case was discussed with Dr. Corliss Myers of neuro interventional radiology and Dr. Otelia Limes of [facility] and the patient was accepted for transfer to [facility] for emergent intervention. She was also started on TPA after consenting the patient's emergency contact with full discussion of risks of bleeding and importance of accurately determining the patient's last known well.[name] confirm the last known well as described above and agreed that the patient would want TPA given the risk/benefit.She confirmed that the patient had no recent procedures or other contraindications to TPA.Regarding her baseline, she is functional at baseline and family believes she is smoking at least1pack a day currently. She is on Metformin for her diabetes, does not take oral contraceptive pills, has  had no recent surgeries or procedures.   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   CT no acute finding but suspicious for right MCA early ischemic changes.   CTA head and neck showed right ICA terminal and proximal MCA occlusion, right CCA nonocclusive thrombus.  CT perfusion showed large penumbra.   Status post thrombectomy with right terminal ICA, right PCA and right ACA TICI2breperfusion however does have distal micro emboli.  MRI showed large right MCA infarct with 29mm MLS and hemorrhagic transformation.   MRA showed patent intracranial vessels.  CT Head WO Contrast 3/26 620 - Improved midline shift and normalized left lateral ventricular volume after decompressive craniectomy. No progressive infarct or hemorrhage.  2D Echo- EF: 60-65%. No wall motion abnormality.  TCD Bubble - No PFO  Consider TEE once stable  DVT US -No evidence of DVT bilaterally  LDL99  HgbA1c10.6  VTE prophylaxis -Lovenox  No antithromboticprior to admission, now on Aspirin 81mg  daily  Therapy recommendations:PT: CIR, Speech CIR, OT pending  Disposition:Pending  Cerebral edema  CT Headrepeat3/24 -Continued interval evolution of right MCA distribution infarct with associated hemorrhagic transformation. Worsened regional mass effect and edema with increased right-to-left midline shift now measuring up to 9 mm.Increased dilatation of the right lateral ventricle concerning for developing ventricular trapping. No other new acute intracranial abnormality.  CT repeat 3/25 unchangedright cerebral cytotoxic edema with central hemorrhage. Midline shift is similar at 1 cm.Unchanged left  lateral ventricle dilatation with evidence of transependymal flow.  S/pdecompressive hemicraniectomy (3/25)with Dr. 4/25  CT Head WO Contrast 3/26 620 - Improved midline shift and normalized left lateral ventricular volume after decompressive craniectomy. No progressive infarct or hemorrhage.  On keppra 500 mg bid  3% salineoff  Na 137->141->147->150>158>150> 141>139>140  Fever, resolved Leukocytosis  Tmax 101.7->102.5> 98.6> 98.7>99.9  WBC - 16.2>15.7>16.8  Consider TEE once stable to rule out cardioembolic source  UA (4/3) neg  CXR-left perihilar atelectasis  Respiratory Culture (3/26) - Strep Pneumoniae  Blood CxNo growth after5days drawn 3/25  Completed 7 day course of Ceftriaxone on 4/2  Tachycardia and tachypnea Leukocytosis  WBC - 16.2>15.7>16.8  Discussed with CCM Dr. 4/25  Concerning for aspiration pneumonia  Sputum culture (4/3) pending  UA: negative  ABG: ph 7.4 pCO2 40 pao2 155    LE venous doppler repeat (4/3): No evidence of deep vein thrombosis   May consider CTA per PE protocol if concerning for PE  R critical limb ischemia  S/p thrombectomy (3/24) for right brachial artery occlusion  Now Right hand pain, cool to touch, dry eschar wound on the palmar region, decreased dopplers per RN, and tachycardia, tachypnea with leukocytosis  UE arterial duplex (4/3) - right ulnar artery occlusion.   VVS Dr. 09-30-1980 - No surgical intervention at this time.  Recommends medical management with Heparin infusion.    Discussed with Dr. Chestine Spore with Neurosurgery ok'd the initiation of Heparin  Start Heparin infusion per stroke protocol  Percocet 5/325 q 6hr prn for right hand pain  Hyperlipidemia  Home meds:none  LDL 99, goal < 70  On lipitor 40 now  Continue statin at discharge  Poorly controlled Diabetes type II, with hyperglycemia   Home meds:Metformin, Glargine, Aspart  HgbA1c 10.6, goal < 7.0  CBGs  SSI  Levemir to  35 units bid  Novolog 6 units every 4 hours  Cocaine abuse  UDS positive for cocaine  Cessation education will be provided  Tobacco abuse  Current smoker  Smoking cessation counselingwill beprovided  Other Stroke Risk Factors  Substance abuse - UDS: THC POSITIVEWill advise patientto stop using due to stroke risk.  Other Active Problems  Sick thyroid?- TSH: 0.307 (Low) Free T4:1.27 (high)    Hospital day # 11    Brandi Myers, Brandi Myers Stroke NP 07/15/20 2:00pm   ATTENDING NOTE: I reviewed above note and agree with the assessment and plan. Pt was seen and examined.   RN at bedside, patient neuro stable,  awake, alert, eyes open, lethargic with psychomotor slowing, orientated to age, place, but not to time. No aphasia but paucity of speech, moderate to sever aphasia, following most of the simple commands. Right gaze preference, not cross midline, tracking on the right, not blinking to visual threat on the left, PERRL. Left facial droop. Tongue midline. RUE and RLE spontaneous movement against gravity and LUE flaccid and LLE slight withdraw to pain. Pt has significant right hand pain on touch, right fingers cold and pain on touch, dry eschar wound on the palmar region. Sensation and coordination not cooperative, gait not tested.    Still has persistent tachycardia, tachypnea and leukocytosis. UA neg and sputum culture pending.  ABG negative for PE and LE venous Doppler negative for DVT.  However, right upper extremity arterial duplex concerning for ulnar nerve occlusion.  Discussed with VVS Dr. Chestine Sporelark, patient still has inline flow to the wrist with widely patent radial artery.  Patient did have risk of losing fingers over time, but there is no overt tissue loss at this time, no intervention needed.  Dr. Chestine Sporelark, however, recommend heparin IV if possible.  Discussed with neurosurgery Dr. Lorre MunroeJonas, will start heparin IV per stroke protocol.  DC aspirin.  For detailed  assessment and plan, please refer to above as I have made changes wherever appropriate.   Marvel PlanJindong Ayven Pheasant, MD PhD Stroke Neurology 07/15/2020 5:06 PM   This patient is critically ill due to right ischemic upper limb, large right MCA stroke with hemorrhagic conversion, cerebral edema status post craniectomy, uncontrolled diabetes, dysphagia, tachycardia and tachypnea and at significant risk of neurological worsening, death form sepsis, septic shock, gangrene, cerebral edema, aspiration, DKA, seizure. This patient's care requires constant monitoring of vital signs, hemodynamics, respiratory and cardiac monitoring, review of multiple databases, neurological assessment, discussion with family, other specialists and medical decision making of high complexity. I spent 45 minutes of neurocritical care time in the care of this patient.  Discussed with Dr. Chestine Sporelark VVS and Dr. Merrily Pewhand CCM.   To contact Stroke Continuity provider, please refer to [email or web address]. After hours, contact General Neurology

## 2020-07-15 NOTE — Progress Notes (Signed)
No change in exam.  Incision remains clean dry and intact.  We continue to follow.

## 2020-07-15 NOTE — Progress Notes (Signed)
ANTICOAGULATION CONSULT NOTE  Pharmacy Consult for Heparin Indication: RUE brachial artery occlusion  Allergies  Allergen Reactions  . Norco [Hydrocodone-Acetaminophen] Itching, Nausea And Vomiting and Swelling    Patient Measurements: Height: 5\' 5"  (165.1 cm) Weight: 71.3 kg (157 lb 3 oz) IBW/kg (Calculated) : 57 Heparin Dosing Weight: 71.6kg  Vital Signs: Temp: 99.2 F (37.3 C) (04/03 2000) Temp Source: Oral (04/03 2000) BP: 130/95 (04/03 2000) Pulse Rate: 118 (04/03 2000)  Labs: Recent Labs    07/13/20 0517 07/14/20 0558 07/15/20 0409 07/15/20 2059  HGB 8.5* 9.0* 9.2*  --   HCT 31.8* 32.7* 33.6*  --   PLT 432* 495* 559*  --   HEPARINUNFRC  --   --   --  0.17*  CREATININE 0.57 0.57 0.47  --     Estimated Creatinine Clearance: 97.2 mL/min (by C-G formula based on SCr of 0.47 mg/dL).   Medical History: Past Medical History:  Diagnosis Date  . Diabetes mellitus without complication (HCC)   . Hypertension     Assessment: 38 YOF who presented on 3/23 with an acute ischemic stroke s/p tPA 3/23 with hemorrhagic transformation requiring decompressive craniotomy by neurosurgery 3/25. 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 Ischemia noted 4/3 and VVS reconsulted - suspect distal embolization, no further interventions, and to consider Heparin. Neuro and neurosurgery have okayed start of heparin, low goal and no bolus. Head CT appears stable at this time.   The patient received Lovenox 40 mg for VTE prophylaxis on 4/3 AM @ 1030 - now discontinued. Heparin wt: 71 kg  Initial heparin level below goal at 0.17 on 750 units/hr. No bleeding or IV issues noted. Will cautiously dose adjust and recheck level in am.   Goal of Therapy:  Heparin level 0.3-0.5 units/ml Monitor platelets by anticoagulation protocol: Yes   Plan:  - Increase Heparin to 900 units/hr (9 ml/hr) - No bolus and will aim for low goal  0.3-0.5 - Will continue to monitor for any signs/symptoms of bleeding and will follow up with heparin level in 6 hours   Thank you for allowing pharmacy to be a part of this patient's care.  6/3 PharmD., BCPS Clinical Pharmacist 07/15/2020 9:54 PM

## 2020-07-15 NOTE — Progress Notes (Signed)
NAME:  Brandi Myers, MRN:  [id number], DOB:  [birth year], LOS: 11 ADMISSION DATE:  07/04/2020, CONSULTATION DATE:  07/04/2020 REFERRING MD:  Dr. Corliss Skains, CHIEF COMPLAINT:  Vent management    History of Present Illness:  Brandi Myers is a 35 y.o. with PMH significant for HTN and type 2 diabetes who presented as a code stroke due to left sided weakness and slurred speech. Head CT demonstrated right ICA occlusion as well as right common carotid bifurcation thrombus resulting in acute stroke. Patient received TPA in ED at [facility]. Transferred to [facility] where she underwent revascularization of occluded terminal ICA and right MCA and right ACA. Patient remained intubated and was transferred to neuro ICU. Critical care consulted for ventilator management.  Pertinent  Medical History   Uncontrolled diabetes and hypertension  Significant Hospital Events: Including procedures, antibiotic start and stop dates in addition to other pertinent events   .  3/23  Admitted as a code stroke and found to have occluded right ICA resulting in stroke.  Underwent IR revascularization and remained intubated post procedure . 3/24 R brachial clot requiring thrombectomy, hemorrhagic transformation on MRI and worsening midline shift on imaging . 3/25 NSGY decompressive craniectomy, TF started . 3/26 fevers post op, holding abx, SBT/SAT . 3/27 Tracheal aspirate with S.aureus and strep pneumo- started CTX and vanc . 3/28 Stopped vanc . 3/31 extubated  Interim History / Subjective:  Remains tachycardic with persistent leukocytosis   Objective   Blood pressure 126/76, pulse (!) 110, temperature 99.1 F (37.3 C), temperature source Axillary, resp. rate (!) 26, height 5\' 5"  (1.651 m), weight 71.3 kg, SpO2 94 %.        Intake/Output Summary (Last 24 hours) at 07/15/2020 1009 Last data filed at 07/15/2020 1000 Gross per 24 hour  Intake 2357.05 ml  Output 300 ml  Net 2057.05 ml   Filed Weights   07/13/20 0500  07/14/20 0400 07/15/20 0400  Weight: 81.3 kg 76.6 kg 71.3 kg    Examination: General: Young [name], lying on the bed HEENT: Status post craniotomy, scar looks clean, pupils bilateral equal round react light, cortrak in place Cardiovascular: Tachycardic, regular rhythm, no murmur or gallop.  No pulses in right forearm Respiratory: Reduced air entry at the bases, no wheezes or rhonchi Gastrointestinal: Soft, nontender, nondistended bowel sounds present Skin: No rashes, normal turgor.  Cold right hand Neurologic: Awake, following simple commands, rightward gaze deviation, moving right side, plegic left side  Resolved Hospital Problem list   Acute hypoxic respiratory failure due to acute stroke and cerebral edema  Assessment & Plan:  Acute right MCA/ACA territory stroke with vasogenic cerebral edema status post thrombectomy S/p decompressive craniectomy 3/25 Repeat head CT showed stable 3 Stroke team is following Continue secondary stroke prophylaxis  MSSA and strep pneumonia Patient completed treatment with ceftriaxone for 7 days Patient remained tachycardic with a leukocytosis We will repeat respiratory culture and UA  Poorly controlled diabetes type 2, with hyperglycemia Blood sugars are better controlled now Continue levemir + TF coverage; goal CBG 140-180  Anemia of critical illness H&H is stable, continue to monitor  R limb critical limb ischemia S/p thrombectomy Now right forearm is cold, Doppler studies suggest absent signal Vascular surgery was reconsulted by primary team  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, hx polysubstance abuse Tobacco abuse Watch for signs of withdrawal  Best practice (evaluated daily)  Diet: Tube feeds via Cortrak Pain/Anxiety/Delirium protocol (if indicated): N/A VAP protocol (if indicated): No DVT prophylaxis: LMWH  GI prophylaxis: PPI Glucose control:  Basal + SSI Central venous access:  N/A Arterial line:  N/A Foley:  No  Mobility: Out of bed  to chair PT consulted: Yes Last date of multidisciplinary goals of care discussion: per primary Code Status:  full code Disposition: ICU    Total critical care time: 32 minutes  Performed by: Cheri Fowler   Critical care time was exclusive of separately billable procedures and treating other patients.   Critical care was necessary to treat or prevent imminent or life-threatening deterioration.   Critical care was time spent personally by me on the following activities: development of treatment plan with patient and/or surrogate as well as nursing, discussions with consultants, evaluation of patient's response to treatment, examination of patient, obtaining history from patient or surrogate, ordering and performing treatments and interventions, ordering and review of laboratory studies, ordering and review of radiographic studies, pulse oximetry and re-evaluation of patient's condition.   Cheri Fowler MD [facility] See Amion for pager If no response to pager, please call [phone] until 7pm After 7pm, Please call E-link [phone]

## 2020-07-15 NOTE — Progress Notes (Addendum)
[facility]  Vascular asked to reevaluate right hand  Objective 126/76 (!) 113 99.1 F (37.3 C) (Axillary) (!) 27 95%  Intake/Output Summary (Last 24 hours) at 07/15/2020 1225 Last data filed at 07/15/2020 1200 Gross per 24 hour  Intake 2063.7 ml  Output 300 ml  Net 1763.7 ml    Right radial signal triphasic at wrist and ulnar signal pretty brisk at right wrist Some superficial dehiscence of right antecubital incision -healing Dry eschar palmar region of right hand  Laboratory Lab Results: Recent Labs    07/14/20 0558 07/15/20 0409  WBC 15.7* 16.8*  HGB 9.0* 9.2*  HCT 32.7* 33.6*  PLT 495* 559*   BMET Recent Labs    07/14/20 0558 07/15/20 0409  NA 139 140  K 3.8 3.8  CL 105 105  CO2 22 26  GLUCOSE 187* 134*  BUN 9 8  CREATININE 0.57 0.47  CALCIUM 9.0 9.2    COAG Lab Results  Component Value Date   INR 1.2 07/04/2020   No results found for: PTT  Assessment/Planning:  35 year old female status post right upper extremity thrombectomy 3/24.  Vascular was asked to reevaluate her right hand today.  On my exam I think her hand looks very much the same.  She has a triphasic radial artery signal at the wrist and also has an ulnar signal slightly less brisk.  She does have a dry eschar wound on the palmar region of her right hand that has been present since my initial consultation.  She does have some pain particularly in the thumb.  I suspect she may have embolized distally into the digital arteries and or palmar arch when she had her ischemic upper extremity.  I had some trouble getting Fogarty catheter down the ulnar artery and much more success down the radial.  Given inline flow to the wrist with a widely patent radial on duplex, I do not think there is any further vascular invention at this time.  I would reevaluate the option of starting her on heparin.  She may ultimately be at the risk of losing fingers over time but there is no  overt tissue loss at this time other than the small eschar on the palmar arch of her hand - this is dry, no signs of infection.  Do not feel this is source of infection.      Cephus Shelling 07/15/2020 12:25 PM --

## 2020-07-15 NOTE — Progress Notes (Signed)
VASCULAR LAB    Right upper extremity arterial duplex has been performed.  See CV proc for preliminary results.   Amisha Pospisil, RVT 07/15/2020, 10:11 AM

## 2020-07-15 NOTE — Progress Notes (Signed)
ANTICOAGULATION CONSULT NOTE - Initial Consult  Pharmacy Consult for Heparin Indication: RUE brachial artery occlusion  Allergies  Allergen Reactions  . Norco [Hydrocodone-Acetaminophen] Itching, Nausea And Vomiting and Swelling    Patient Measurements: Height: 5\' 5"  (165.1 cm) Weight: 71.3 kg (157 lb 3 oz) IBW/kg (Calculated) : 57 Heparin Dosing Weight: 71.6kg  Vital Signs: Temp: 99.9 F (37.7 C) (04/03 1200) Temp Source: Axillary (04/03 1200) BP: 128/80 (04/03 1300) Pulse Rate: 114 (04/03 1300)  Labs: Recent Labs    07/13/20 0517 07/14/20 0558 07/15/20 0409  HGB 8.5* 9.0* 9.2*  HCT 31.8* 32.7* 33.6*  PLT 432* 495* 559*  CREATININE 0.57 0.57 0.47    Estimated Creatinine Clearance: 97.2 mL/min (by C-G formula based on SCr of 0.47 mg/dL).   Medical History: Past Medical History:  Diagnosis Date  . Diabetes mellitus without complication (HCC)   . Hypertension     Assessment: 46 YOF who presented on 3/23 with an acute ischemic stroke s/p tPA 3/23 with hemorrhagic transformation requiring decompressive craniotomy by neurosurgery 3/25. 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 Ischemia noted 4/3 and VVS reconsulted - suspect distal embolization, no further interventions, and to consider Heparin. Neuro and neurosurgery have okayed start of heparin, low goal and no bolus. Head CT appears stable at this time.   The patient received Lovenox 40 mg for VTE prophylaxis on 4/3 AM @ 1030 - now discontinued. Heparin wt: 71 kg  Goal of Therapy:  Heparin level 0.3-0.5 units/ml Monitor platelets by anticoagulation protocol: Yes   Plan:  - Start Heparin at 750 units/hr (7.5 ml/hr) - No bolus and will aim for low goal 0.3-0.5 - Will continue to monitor for any signs/symptoms of bleeding and will follow up with heparin level in 6 hours   Thank you for allowing pharmacy to be a part of this patient's care.  6/3, PharmD, BCPS Clinical Pharmacist Clinical phone for 07/15/2020: [phone] 07/15/2020 1:36 PM   **Pharmacist phone directory can now be found on [email or web address] ([email or web address]).  Listed under [facility].

## 2020-07-15 NOTE — Progress Notes (Signed)
VASCULAR LAB    Bilateral lower extremity venous duplex has been performed.  See CV proc for preliminary results.   Zayaan Kozak, RVT 07/15/2020, 10:12 AM

## 2020-07-16 ENCOUNTER — Inpatient Hospital Stay (HOSPITAL_COMMUNITY): Payer: Medicaid Other

## 2020-07-16 ENCOUNTER — Other Ambulatory Visit: Payer: Self-pay

## 2020-07-16 DIAGNOSIS — I6601 Occlusion and stenosis of right middle cerebral artery: Secondary | ICD-10-CM | POA: Diagnosis not present

## 2020-07-16 DIAGNOSIS — J95851 Ventilator associated pneumonia: Secondary | ICD-10-CM | POA: Diagnosis not present

## 2020-07-16 DIAGNOSIS — I63131 Cerebral infarction due to embolism of right carotid artery: Secondary | ICD-10-CM | POA: Diagnosis not present

## 2020-07-16 DIAGNOSIS — G936 Cerebral edema: Secondary | ICD-10-CM | POA: Diagnosis not present

## 2020-07-16 LAB — CBC
HCT: 35.8 % — ABNORMAL LOW (ref 36.0–46.0)
Hemoglobin: 9.7 g/dL — ABNORMAL LOW (ref 12.0–15.0)
MCH: 21 pg — ABNORMAL LOW (ref 26.0–34.0)
MCHC: 27.1 g/dL — ABNORMAL LOW (ref 30.0–36.0)
MCV: 77.5 fL — ABNORMAL LOW (ref 80.0–100.0)
Platelets: 608 10*3/uL — ABNORMAL HIGH (ref 150–400)
RBC: 4.62 MIL/uL (ref 3.87–5.11)
RDW: 28.1 % — ABNORMAL HIGH (ref 11.5–15.5)
WBC: 18.6 10*3/uL — ABNORMAL HIGH (ref 4.0–10.5)
nRBC: 0.3 % — ABNORMAL HIGH (ref 0.0–0.2)

## 2020-07-16 LAB — BASIC METABOLIC PANEL
Anion gap: 9 (ref 5–15)
BUN: 12 mg/dL (ref 6–20)
CO2: 26 mmol/L (ref 22–32)
Calcium: 9.2 mg/dL (ref 8.9–10.3)
Chloride: 104 mmol/L (ref 98–111)
Creatinine, Ser: 0.62 mg/dL (ref 0.44–1.00)
GFR, Estimated: >60 mL/min (ref >60)
Glucose, Bld: 127 mg/dL — ABNORMAL HIGH (ref 70–99)
Potassium: 3.9 mmol/L (ref 3.5–5.1)
Sodium: 139 mmol/L (ref 135–145)

## 2020-07-16 LAB — GLUCOSE, CAPILLARY
Glucose-Capillary: 135 mg/dL — ABNORMAL HIGH (ref 70–99)
Glucose-Capillary: 129 mg/dL — ABNORMAL HIGH (ref 70–99)
Glucose-Capillary: 200 mg/dL — ABNORMAL HIGH (ref 70–99)
Glucose-Capillary: 139 mg/dL — ABNORMAL HIGH (ref 70–99)
Glucose-Capillary: 120 mg/dL — ABNORMAL HIGH (ref 70–99)

## 2020-07-16 LAB — HEPARIN LEVEL (UNFRACTIONATED)
Heparin Unfractionated: <0.1 IU/mL — ABNORMAL LOW (ref 0.30–0.70)
Heparin Unfractionated: <0.1 IU/mL — ABNORMAL LOW (ref 0.30–0.70)

## 2020-07-16 LAB — LACTIC ACID, PLASMA: Lactic Acid, Venous: 1.1 mmol/L (ref 0.5–1.9)

## 2020-07-16 MED ORDER — LABETALOL HCL 5 MG/ML IV SOLN: 10.0000 mg | INTRAVENOUS | Status: DC | PRN

## 2020-07-16 MED ORDER — ORAL CARE MOUTH RINSE
15.0000 mL | Freq: Two times a day (BID) | OROMUCOSAL | Status: DC
  Administered 2020-07-16 – 2020-08-03 (×36): 15 mL via OROMUCOSAL

## 2020-07-16 NOTE — Progress Notes (Signed)
Physical Therapy Treatment Patient Details Name: Brandi Myers MRN: [id number] DOB: [birth year] Today's Date: 07/16/2020    History of Present Illness 35 yo female admitted with L sided weakness givent TPA for R MCA involving basal ganglia frontal temporal lobes  3/23 s/p BIL revascularization  R MCA ACA achieving 2B revascularization PT intubated starting 3/23 . 3/24 R UE thromectomy CT reveals worsening midline shift start 3% saline 3/25 decompressive hemicranitectomy R side bone flap in abdomen 3/25 fever 103. Extubated 3/31. PMH current smoker uncontrolled DM HTn    PT Comments    Pt A&Ox3, follows right sided commands inconsistently, and demonstrates decreased safety awareness. Pt requiring two person maximal assist for bed mobility. Attempted standing from edge of bed today but pt with no initiation and unable to grip with right hand on back of recliner in order to pull up to standing. Will need maxi move for out of bed. Continue to recommend comprehensive inpatient rehab (CIR) for post-acute therapy needs.    Follow Up Recommendations  CIR     Equipment Recommendations  Other (comment) (TBA)    Recommendations for Other Services       Precautions / Restrictions Precautions Precautions: Fall Precaution Comments: bone flap out in R abdomen, L hemiparesis, rectal tube, NG tube Restrictions Weight Bearing Restrictions: No    Mobility  Bed Mobility Overal bed mobility: Needs Assistance Bed Mobility: Supine to Sit     Supine to sit: +2 for physical assistance;Max assist Sit to supine: Max assist;+2 for physical assistance   General bed mobility comments: Pt able to maneuver RLE to edge of bed, assist for trunk to upright    Transfers Overall transfer level: Needs assistance Equipment used: None Transfers: Sit to/from Stand Sit to Stand: Total assist         General transfer comment: Attempted standing from edge of bed x 2, pt able to minimally clear hips, no  initiation noted. Unable to grip onto back of recliner with RUE in order to facilitate pulling up.  Ambulation/Gait                 Stairs             Wheelchair Mobility    Modified Rankin (Stroke Patients Only) Modified Rankin (Stroke Patients Only) Pre-Morbid Rankin Score: No symptoms Modified Rankin: Severe disability     Balance Overall balance assessment: Needs assistance Sitting-balance support: Single extremity supported;Feet unsupported Sitting balance-Leahy Scale: Poor Sitting balance - Comments: At least minA for balance, tends to lean forward, cues for upright posture                                    Cognition Arousal/Alertness: Lethargic Behavior During Therapy: Restless;Flat affect;Impulsive Overall Cognitive Status: Difficult to assess                                 General Comments: Pt A&Ox3, pt looking at her right arm and stating, "I look like a heroin addict." Follows right sided commands inconsistently. Poor safety awareness      Exercises      General Comments        Pertinent Vitals/Pain Pain Assessment: Faces Faces Pain Scale: No hurt    Home Living  Prior Function            PT Goals (current goals can now be found in the care plan section) Acute Rehab PT Goals PT Goal Formulation: Patient unable to participate in goal setting Time For Goal Achievement: 07/24/20 Potential to Achieve Goals: Fair Progress towards PT goals: Progressing toward goals    Frequency    Min 4X/week      PT Plan Current plan remains appropriate    Co-evaluation              AM-PAC PT "6 Clicks" Mobility   Outcome Measure  Help needed turning from your back to your side while in a flat bed without using bedrails?: Total Help needed moving from lying on your back to sitting on the side of a flat bed without using bedrails?: Total Help needed moving to and from a bed to a  chair (including a wheelchair)?: Total Help needed standing up from a chair using your arms (e.g., wheelchair or bedside chair)?: Total Help needed to walk in hospital room?: Total Help needed climbing 3-5 steps with a railing? : Total 6 Click Score: 6    End of Session   Activity Tolerance: Patient tolerated treatment well Patient left: in bed;with call bell/phone within reach;with bed alarm set;with restraints reapplied Nurse Communication: Mobility status PT Visit Diagnosis: Other abnormalities of gait and mobility (R26.89);Hemiplegia and hemiparesis;Other symptoms and signs involving the nervous system (R29.898);Muscle weakness (generalized) (M62.81);Difficulty in walking, not elsewhere classified (R26.2) Hemiplegia - Right/Left: Left Hemiplegia - dominant/non-dominant: Non-dominant Hemiplegia - caused by: Cerebral infarction     Time: 7017-7939 PT Time Calculation (min) (ACUTE ONLY): 28 min  Charges:  $Therapeutic Activity: 23-37 mins                     Lillia Pauls, PT, DPT Acute Rehabilitation Services Pager [phone] Office [phone]    [name] 07/16/2020, 4:42 PM

## 2020-07-16 NOTE — Progress Notes (Signed)
ANTICOAGULATION CONSULT NOTE - Follow Up Consult  Pharmacy Consult for Heparin Indication: RUE brachial artery occlusion  Allergies  Allergen Reactions  . Norco [Hydrocodone-Acetaminophen] Itching, Nausea And Vomiting and Swelling    Patient Measurements: Height: 5\' 5"  (165.1 cm) Weight: 70.5 kg (155 lb 6.8 oz) IBW/kg (Calculated) : 57 Heparin Dosing Weight: 71.6kg  Vital Signs: Temp: 98.8 F (37.1 C) (04/04 1543) Temp Source: Axillary (04/04 1543) BP: 123/82 (04/04 1543) Pulse Rate: 116 (04/04 1543)  Labs: Recent Labs    07/14/20 0558 07/15/20 0409 07/15/20 2059 07/16/20 0654 07/16/20 1541  HGB 9.0* 9.2*  --  9.7*  --   HCT 32.7* 33.6*  --  35.8*  --   PLT 495* 559*  --  608*  --   HEPARINUNFRC  --   --  0.17* <0.10* <0.10*  CREATININE 0.57 0.47  --  0.62  --     Estimated Creatinine Clearance: 96.7 mL/min (by C-G formula based on SCr of 0.62 mg/dL).   Medical History: Past Medical History:  Diagnosis Date  . Diabetes mellitus without complication (HCC)   . Hypertension     Assessment: 57 YOF who presented on 3/23 with an acute ischemic stroke s/p tPA 3/23 with hemorrhagic transformation requiring decompressive craniotomy by neurosurgery 3/25. 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 Ischemia noted 4/3 and VVS reconsulted - suspect distal embolization, no further interventions, and to consider Heparin. Neuro and neurosurgery have okayed start of heparin, low goal and no bolus. Head CT appears stable at this time.   Repeat heparin level remains undetectable on 1100 units/hr of IV heparin. H/H slowly trending up. Plt 608k. No s/s of overt bleeding noted.   Goal of Therapy:  Heparin level 0.3-0.5 units/ml Monitor platelets by anticoagulation protocol: Yes   Plan:  - Increase Heparin to 1300 units/hr  - No bolus and will aim for low goal 0.3-0.5 - Will continue to monitor for any signs/symptoms of  bleeding and will follow up with heparin level in 6 hours   Thank you for allowing pharmacy to be a part of this patient's care.  6/3, Reece Leader, BCCP Clinical Pharmacist  07/16/2020 5:26 PM   [facility] pharmacy phone numbers are listed on [email or web address]

## 2020-07-16 NOTE — Progress Notes (Signed)
Subjective: Patient reports that she has been extubated and is oriented to self. No acute events reported overnight.   Objective: Vital signs in last 24 hours: Temp:  [98.7 F (37.1 C)-99.9 F (37.7 C)] 98.7 F (37.1 C) (04/04 0800) Pulse Rate:  [110-132] 119 (04/04 0600) Resp:  [19-35] 25 (04/04 0600) BP: (111-145)/(56-98) 111/90 (04/04 0600) SpO2:  [92 %-98 %] 97 % (04/04 0600) Weight:  [70.5 kg] 70.5 kg (04/04 0400)  Intake/Output from previous day: 04/03 0701 - 04/04 0700 In: 1317.2 [I.V.:117.2; NG/GT:1200] Out: -  Intake/Output this shift: No intake/output data recorded.  Physical Exam:  Patient arouses to voice. She is oriented to self only. Makes an effort to make eye contact, but not sustained. PERRL. Follows commands on the right side. Hemiplegic on the left. Cranial and abdominal dressings areCDI. Incisions arewell approximated with no drainage, erythema, or edema.  Lab Results: Recent Labs    07/15/20 0409 07/16/20 0654  WBC 16.8* 18.6*  HGB 9.2* 9.7*  HCT 33.6* 35.8*  PLT 559* 608*   BMET Recent Labs    07/15/20 0409 07/16/20 0654  NA 140 139  K 3.8 3.9  CL 105 104  CO2 26 26  GLUCOSE 134* 127*  BUN 8 12  CREATININE 0.47 0.62  CALCIUM 9.2 9.2    Studies/Results: VAS [place] UPPER EXTREMITY ARTERIAL DUPLEX  Result Date: 07/15/2020 UPPER EXTREMITY DUPLEX STUDY Indications: Patient complains of Cool, purple fingers, wound on right palm,              recent right upper extremity thrombectomy. History:     Patient has a history of upper extremity pain.  Limitations: Patient confusion, constant movement of right arm. Comparison Study: Prior study done 07/05/20 Performing Technologist: Sherren Kerns RVS  Examination Guidelines: A complete evaluation includes B-mode imaging, spectral Doppler, color Doppler, and power Doppler as needed of all accessible portions of each vessel. Bilateral testing is considered an integral part of a complete examination. Limited  examinations for reoccurring indications may be performed as noted.  Right Doppler Findings: +---------------+----------+-------------------+--------+------------------+ Site           PSV (cm/s)Waveform           StenosisComments           +---------------+----------+-------------------+--------+------------------+ Subclavian Prox52        triphasic                                     +---------------+----------+-------------------+--------+------------------+ Subclavian Mid -49       triphasic                                     +---------------+----------+-------------------+--------+------------------+ Subclavian Dist          triphasic                                     +---------------+----------+-------------------+--------+------------------+ Axillary       48        triphasic                                     +---------------+----------+-------------------+--------+------------------+ Brachial Prox  37        triphasic                                     +---------------+----------+-------------------+--------+------------------+  Brachial Mid   31        triphasic                                     +---------------+----------+-------------------+--------+------------------+ Brachial Dist  -29       triphasic                                     +---------------+----------+-------------------+--------+------------------+ Radial Mid     60        triphasic                                     +---------------+----------+-------------------+--------+------------------+ Radial Dist    53        triphasic                                     +---------------+----------+-------------------+--------+------------------+ Ulnar Mid                dampened monophasic        partially occluded +---------------+----------+-------------------+--------+------------------+ Palmar Arch              dampened monophasic        minimal flow noted  +---------------+----------+-------------------+--------+------------------+    Summary:  Right: Right ulnar artery appears occluded. *See table(s) above for measurements and observations. Electronically signed by Sherald Hess MD on 07/15/2020 at 4:21:03 PM.    Final    VAS [place] LOWER EXTREMITY VENOUS (DVT)  Result Date: 07/15/2020  Lower Venous DVT Study Indications: Tachycardia, tachypnea. Stroke.  Comparison Study: Prior negative BLEV done 07/05/20 Performing Technologist: Sherren Kerns RVS  Examination Guidelines: A complete evaluation includes B-mode imaging, spectral Doppler, color Doppler, and power Doppler as needed of all accessible portions of each vessel. Bilateral testing is considered an integral part of a complete examination. Limited examinations for reoccurring indications may be performed as noted. The reflux portion of the exam is performed with the patient in reverse Trendelenburg.  +---------+---------------+---------+-----------+----------+--------------+ RIGHT    CompressibilityPhasicitySpontaneityPropertiesThrombus Aging +---------+---------------+---------+-----------+----------+--------------+ CFV      Full           Yes      Yes                                 +---------+---------------+---------+-----------+----------+--------------+ SFJ      Full                                                        +---------+---------------+---------+-----------+----------+--------------+ FV Prox  Full                                                        +---------+---------------+---------+-----------+----------+--------------+ FV Mid   Full                                                        +---------+---------------+---------+-----------+----------+--------------+  FV DistalFull                                                        +---------+---------------+---------+-----------+----------+--------------+ PFV      Full                                                         +---------+---------------+---------+-----------+----------+--------------+ POP      Full           Yes      Yes                                 +---------+---------------+---------+-----------+----------+--------------+ PTV      Full                                                        +---------+---------------+---------+-----------+----------+--------------+ PERO     Full                                                        +---------+---------------+---------+-----------+----------+--------------+   +---------+---------------+---------+-----------+----------+--------------+ LEFT     CompressibilityPhasicitySpontaneityPropertiesThrombus Aging +---------+---------------+---------+-----------+----------+--------------+ CFV      Full           Yes      Yes                                 +---------+---------------+---------+-----------+----------+--------------+ SFJ      Full                                                        +---------+---------------+---------+-----------+----------+--------------+ FV Prox  Full                                                        +---------+---------------+---------+-----------+----------+--------------+ FV Mid   Full                                                        +---------+---------------+---------+-----------+----------+--------------+ FV DistalFull                                                        +---------+---------------+---------+-----------+----------+--------------+  PFV      Full                                                        +---------+---------------+---------+-----------+----------+--------------+ POP      Full           Yes      Yes                                 +---------+---------------+---------+-----------+----------+--------------+ PTV      Full                                                         +---------+---------------+---------+-----------+----------+--------------+ PERO     Full                                                        +---------+---------------+---------+-----------+----------+--------------+     Summary: BILATERAL: - No evidence of deep vein thrombosis seen in the lower extremities, bilaterally. -   *See table(s) above for measurements and observations. Electronically signed by Sherald Hess MD on 07/15/2020 at 4:20:07 PM.    Final     Assessment/Plan: Patienthas been extubated and continues to have a stable to improving neurological exam. She is continuing to follow commands on her right side but is hemiplegic on the left. No new neurosurgical recommendations. Will plan to remove crani staples on Friday, 4/8. Continue supportive care. Call with any questions.  LOS: 12 days     Council Mechanic, DNP, NP-C 07/16/2020, 8:34 AM

## 2020-07-16 NOTE — Progress Notes (Addendum)
NAME:  Brandi Myers, MRN:  [id number], DOB:  06/13/1985, LOS: 12 ADMISSION DATE:  07/04/2020, CONSULTATION DATE:  07/04/2020 REFERRING MD:  Dr. Corliss Skains, CHIEF COMPLAINT:  Vent management    History of Present Illness:  Brandi Myers is a 35 y.o. with PMH significant for HTN and type 2 diabetes who presented as a code stroke due to left sided weakness and slurred speech. Head CT demonstrated right ICA occlusion as well as right common carotid bifurcation thrombus resulting in acute stroke. Patient received TPA in ED at [facility]. Transferred to [facility] where she underwent revascularization of occluded terminal ICA and right MCA and right ACA. Patient remained intubated and was transferred to neuro ICU. Critical care consulted for ventilator management.  Pertinent  Medical History   Uncontrolled diabetes and hypertension  Significant Hospital Events: Including procedures, antibiotic start and stop dates in addition to other pertinent events   .  3/23  Admitted as a code stroke and found to have occluded right ICA resulting in stroke.  Underwent IR revascularization and remained intubated post procedure . 3/24 R brachial clot requiring thrombectomy, hemorrhagic transformation on MRI and worsening midline shift on imaging . 3/25 NSGY decompressive craniectomy, TF started . 3/26 fevers post op, holding abx, SBT/SAT . 3/27 Tracheal aspirate with S.aureus and strep pneumo- started CTX and vanc . 3/28 Stopped vanc . 3/31 extubated . 4/2 more awake, ongoing right gaze, f/c on right , LUE flaccid, CTH showing evolving right MCA infarct with slight improvement of swelling and midline shift.  Afebrile.  Remains tachycardic with HR between 100-112.  WBC 15.7.  Urinalysis was negative . 4/3 remains lethargic, tachycardic and on room air, no neuro changes, RUE arterial duplex - triphasic radial artery signal at the wrist, ulnar signal slightly less brisk, ongoing dry eschar wound on the palmar region  of her right hand unchanged > vascular following, no further intervention at this time; started on heparin . 4/4 tx to PCU/ PCCM off   Interim History / Subjective:  Alert and oriented x 4 today Remains on heparin gtt, levels remains low C/o of pain in right fingers- still able to move/ sensation intact, Asking to go outside for a cigarette  Pending transfer to PCU    Objective   Blood pressure 130/78, pulse (!) 111, temperature 98.7 F (37.1 C), temperature source Axillary, resp. rate (!) 25, height 5\' 5"  (1.651 m), weight 70.5 kg, SpO2 94 %.        Intake/Output Summary (Last 24 hours) at 07/16/2020 0916 Last data filed at 07/16/2020 0900 Gross per 24 hour  Intake 1343.09 ml  Output --  Net 1343.09 ml   Filed Weights   07/14/20 0400 07/15/20 0400 07/16/20 0400  Weight: 76.6 kg 71.3 kg 70.5 kg    Examination: General:  Young female sitting upright in bed in NAD HEENT: MM pink/moist, crani site soft, staples intact/ clean, pupils 4/equal / reactive, right cortrak  Neuro: alert and oriented x 4, 5/5 on right, flaccid on left, right gaze, unable to cross midline CV:rr PULM:  Non labored, on room air, some rhonchi, clears with cough- strong GI: soft, bs active, purwick, flap site CDI Extremities: warm/dry, no edema, R incision healing/ CDI, area of right palmar eschar-dry, fingers with delayed cap refill, able to move all fingers, +1 radial pulse Skin: no rashes   Resolved Hospital Problem list   Acute hypoxic respiratory failure due to acute stroke and cerebral edema- extubated 3/31  Assessment &  Plan:  Acute right MCA/ACA territory stroke with vasogenic cerebral edema status post thrombectomy S/p decompressive craniectomy 3/25 - per Neurology and NSGY - transferring to PCU today  - ongoing neuro exams   MSSA and strep pneumonia - completed 7 days of CTX 2g daily 4/2 - BCx 3/25 neg  - monitor clinically, remains afebrile, WBC 16.8-> 18.6  Poorly controlled diabetes type  2, with hyperglycemia - Continue levemir + TF coverage; goal CBG 140-180  Anemia of critical illness - stable on heparin, trend CBC  R limb critical limb ischemia S/p thrombectomy 3/24 by vascular - Continue neurovascular checks, no further vascular intervention at this time, at risk for possible loss of digits, continue monitoring dry eschar on palmar arch of hand - heparin started 4/3 per pharmacy, levels remain low   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, hx polysubstance abuse Tobacco abuse - Watch for signs of withdrawal  Nothing further to add.  PCCM will sign off.  Please do not hesitate to call us back if we can be of any further assistance.   Best practice (evaluated daily)  Diet: Tube feeds via Cortrak Pain/Anxiety/Delirium protocol (if indicated): N/A VAP protocol (if indicated): No DVT prophylaxis: Systemic AC/ heparin  GI prophylaxis: PPI Glucose control:  Basal + SSI Central venous access:  N/A Arterial line:  N/A Foley:  No  Mobility: Out of bed to chair/ PT PT consulted: Yes Last date of multidisciplinary goals of care discussion: per primary Code Status:  full code Disposition: ICU- transferring to PCU 4/4   Posey Boyer, ACNP [facility] 07/16/2020, 9:16 AM     Critical care attending attestation note:  Patient seen and examined and relevant ancillary tests reviewed.  I agree with the assessment and plan of care as outlined by Norton Blizzard, NP. The following reflects my independent critical care time.   Synopsis of assessment and plan: 35 year old female with diabetes and hypertension who presented with right-sided acute MCA stroke, with associated severe cerebral edema requiring decompressive craniotomy  Patient was seen by vascular surgery yesterday for right upper limb ischemia, recommend starting heparin infusion   Physical exam: General: Acutely ill-appearing female, lying on the bed HEENT: Surgical craniotomy scar looks clean, eyes  anicteric.  Dry mucous membranes Neuro: Awake, following commands, good strength on the right side flaccid left side with right gaze deviation chest: Coarse breath sounds, no wheezes or rhonchi Heart: Regular rate and rhythm, no murmurs or gallops Abdomen: Soft, nontender, nondistended, bowel sounds present Skin: No rash  Labs and images reviewed  Assessment plan: Acute right MCA/ACA territory stroke with cerebral edema status post thrombectomy MSSA/strep pneumo pneumonia Poorly controlled diabetes with hyperglycemia Right limb ischemia status post thrombectomy  Stroke team is following Continue secondary stroke prophylaxis Patient completed therapy with ceftriaxone for 7 days Continue to monitor blood sugar, on Levemir and SSI Continue IV heparin infusion Vascular surgery is following    Cheri Fowler MD [facility] See Amion for pager If no response to pager, please call [phone] until 7pm After 7pm, Please call E-link [phone]  07/16/2020, 11:12 AM

## 2020-07-16 NOTE — Progress Notes (Signed)
Inpatient Rehabilitation Admissions Coordinator  I will place order for rehab consult per protocol.  Ottie Glazier, RN, MSN Rehab Admissions Coordinator [phone] 07/16/2020 5:13 PM

## 2020-07-16 NOTE — Progress Notes (Signed)
ANTICOAGULATION CONSULT NOTE - Follow Up Consult  Pharmacy Consult for Heparin Indication: RUE brachial artery occlusion  Allergies  Allergen Reactions  . Norco [Hydrocodone-Acetaminophen] Itching, Nausea And Vomiting and Swelling    Patient Measurements: Height: 5\' 5"  (165.1 cm) Weight: 70.5 kg (155 lb 6.8 oz) IBW/kg (Calculated) : 57 Heparin Dosing Weight: 71.6kg  Vital Signs: Temp: 98.7 F (37.1 C) (04/04 0800) Temp Source: Axillary (04/04 0800) BP: 130/78 (04/04 0900) Pulse Rate: 111 (04/04 0900)  Labs: Recent Labs    07/14/20 0558 07/15/20 0409 07/15/20 2059 07/16/20 0654  HGB 9.0* 9.2*  --  9.7*  HCT 32.7* 33.6*  --  35.8*  PLT 495* 559*  --  608*  HEPARINUNFRC  --   --  0.17* <0.10*  CREATININE 0.57 0.47  --  0.62    Estimated Creatinine Clearance: 96.7 mL/min (by C-G formula based on SCr of 0.62 mg/dL).   Medical History: Past Medical History:  Diagnosis Date  . Diabetes mellitus without complication (HCC)   . Hypertension     Assessment: 84 YOF who presented on 3/23 with an acute ischemic stroke s/p tPA 3/23 with hemorrhagic transformation requiring decompressive craniotomy by neurosurgery 3/25. 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 Ischemia noted 4/3 and VVS reconsulted - suspect distal embolization, no further interventions, and to consider Heparin. Neuro and neurosurgery have okayed start of heparin, low goal and no bolus. Head CT appears stable at this time.   Repeat heparin level is now undetectable on 900 units/hr of IV heparin. H/H slowly trending up. Plt 608k. No s/s of overt bleeding noted.   Goal of Therapy:  Heparin level 0.3-0.5 units/ml Monitor platelets by anticoagulation protocol: Yes   Plan:  - Increase Heparin to 1100 units/hr  - No bolus and will aim for low goal 0.3-0.5 - Will continue to monitor for any signs/symptoms of bleeding and will follow up with heparin  level in 6 hours   Thank you for allowing pharmacy to be a part of this patient's care.  6/3, PharmD., BCPS, BCCCP Clinical Pharmacist Please refer to [facility] for unit-specific pharmacist

## 2020-07-16 NOTE — Plan of Care (Signed)
  Problem: Nutrition: Goal: Adequate nutrition will be maintained Outcome: Progressing   

## 2020-07-16 NOTE — Progress Notes (Addendum)
STROKE TEAM PROGRESS NOTE   INTERVAL HISTORY  Patient is much more awake in bed. Alert and oriented x4.  PERLA Has R gaze preference, does not cross midline, inconsistent blinking to visual threat on the left, left facial droop, follows simple commands on the right side.  RUE/RLE 5/5 strength, LUE flaccid, LLE weak withdraw to pain.    Has persistent tachycardia, tachypnea and leukocytosis. Remains afebrile.  Right hand is warmer to touch.  Decrease pain.  Able to move all fingers without difficulty.  Sensation intact.     Vitals:   07/16/20 0400 07/16/20 0500 07/16/20 0600 07/16/20 0800  BP: (!) 128/56 (!) 125/98 111/90   Pulse: (!) 117 (!) 117 (!) 119   Resp: 20 (!) 27 (!) 25   Temp: 99.6 F (37.6 C)   98.7 F (37.1 C)  TempSrc: Oral   Axillary  SpO2: 96% 98% 97%   Weight: 70.5 kg     Height:       CBC:  Recent Labs  Lab 07/15/20 0409 07/16/20 0654  WBC 16.8* 18.6*  HGB 9.2* 9.7*  HCT 33.6* 35.8*  MCV 76.9* 77.5*  PLT 559* 608*   Basic Metabolic Panel:  Recent Labs  Lab 07/10/20 0621 07/12/20 0500 07/15/20 0409 07/16/20 0654  NA 150*   < > 140 139  K 3.4*   < > 3.8 3.9  CL 112*   < > 105 104  CO2 32   < > 26 26  GLUCOSE 109*   < > 134* 127*  BUN 7   < > 8 12  CREATININE 0.49   < > 0.47 0.62  CALCIUM 8.2*   < > 9.2 9.2  MG 1.9  --   --   --   PHOS 4.3  --   --   --    < > = values in this interval not displayed.   Lipid Panel:  No results for input(s): CHOL, TRIG, HDL, CHOLHDL, VLDL, LDLCALC in the last 168 hours. HgbA1c: No results for input(s): HGBA1C in the last 168 hours. Urine Drug Screen: No results for input(s): LABOPIA, COCAINSCRNUR, LABBENZ, AMPHETMU, THCU, LABBARB in the last 168 hours.  Alcohol Level No results for input(s): ETH in the last 168 hours.  IMAGING past 24 hours CT HEAD WO CONTRAST  Result Date: 07/14/2020  IMPRESSION:  1. Evolving right MCA territory infarct with slightly decreased swelling and midline shift.  2. Improved sinus  inflammation. 3. No new abnormality.    PHYSICAL EXAM Blood pressure 111/90, pulse (!) 119, temperature 98.7 F (37.1 C), temperature source Axillary, resp. rate (!) 25, height 5\' 5"  (1.651 m), weight 70.5 kg, SpO2 97 %.  General: caucasian female, no apparent distress.  Right craniotomy bony defect with staples.    Lungs: Symmetrical Chest rise, no labored breathing  Cardio: Regular Rate and Rhythm  Abdomen: Soft, non-tender Skin: right hand decrease pain, right fingers less cool, dry eschar wound on the palmar region unchanged.  Neurological Exam : Awake, opens eyes. Able to follow commands on the right side without difficulty on the right. Cranial Nerves: II:  pupils equal, round, reactive to light and accommodation, not blinking to visual threat on the left III,IV, VI: ptosis not present, not crossing midline, tracking on the right,  V,VII: left facial droop VIII: hearing normal bilaterally XII: midline tongue extension without atrophy or fasciculations  Motor: Right Upper extremity   5/5 follows commands    Left Upper extremity   0/5 Right  Lower extremity   5/5 follows commands    Left Lower extremity   1/5 withdraw to pain Tone and bulk:normal tone throughout; no atrophy noted   ASSESSMENT/PLAN Ms. LILI HARTS is a 35 y.o. female with history of uncontrolled diabetesandhypertensionpresenting with left sided weakness and slurred speech.Per family there is a complicated social situation at this time. She lives intermittently in an apartment by herself but was kicked out and they went to pick her up at 5:30 AM. At that point she seemed "off"and generally quiet but did not seem to have any difficulty with left-sided weakness.Family attributed this to psychosocial issues and potential substance use noting that her sister passed away from an overdose in October. Later in the morning she complained of diffuse body pain and was administered gabapentin and a muscle  relaxer.At 11:30 AM she stood up and walkedto the showerand at1215 she was found on the bottom of the tub with slurred speech and difficulty moving the left side. She was not preactivated by EMS, but on arrival in the ED clearly appear to have a doorway examination consistent with a right MCA territory infarct.CTA revealed right ICA occlusion as well as a right common carotid bifurcation thrombus extending into the external carotid (felt to be the source of the occlusion). CT perfusion imaging demonstrated possible core infarct of 51 mL (though notably this was obtained within 2 hours of symptom onset and therefore may reflect an overestimate of the true core). Mismatch ratio was 2 with a penumbra of 63 mL. Case was discussed with Dr. Corliss Skains of neuro interventional radiology and Dr. Otelia Limes of [facility] and the patient was accepted for transfer to [facility] for emergent intervention. She was also started on TPA after consenting the patient's emergency contact with full discussion of risks of bleeding and importance of accurately determining the patient's last known well.[name] confirm the last known well as described above and agreed that the patient would want TPA given the risk/benefit.She confirmed that the patient had no recent procedures or other contraindications to TPA.Regarding her baseline, she is functional at baseline and family believes she is smoking at least1pack a day currently. She is on Metformin for her diabetes, does not take oral contraceptive pills, has had no recent surgeries or procedures.   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   CT no acute finding but  suspicious for right MCA early ischemic changes.   CTA head and neck showed right ICA terminal and proximal MCA occlusion, right CCA nonocclusive thrombus.  CT perfusion showed large penumbra.   Status post thrombectomy with right terminal ICA, right PCA and right ACA TICI2breperfusion however does have distal micro emboli.  MRI showed large right MCA infarct with 43mm MLS and hemorrhagic transformation.   MRA showed patent intracranial vessels.  CT Head WO Contrast 3/26 620 - Improved midline shift and normalized left lateral ventricular volume after decompressive craniectomy. No progressive infarct or hemorrhage.  2D Echo- EF: 60-65%. No wall motion abnormality.  TCD Bubble - No PFO  Consider TEE once stable  DVT US -No evidence of DVT bilaterally  LDL99  HgbA1c10.6  VTE prophylaxis -Lovenox  No antithromboticprior to admission, now on Aspirin 81mg  daily  Therapy recommendations:PT: CIR, Speech CIR, OT pending  Disposition:Pending  Cerebral edema  CT Headrepeat3/24 -Continued interval evolution of right MCA distribution infarct with associated hemorrhagic transformation. Worsened regional mass effect and edema with increased right-to-left midline shift now measuring up to 9 mm.Increased dilatation of the right lateral ventricle concerning for developing ventricular trapping. No other new acute intracranial abnormality.  CT repeat 3/25 unchangedright cerebral cytotoxic edema with central hemorrhage. Midline shift is similar at 1 cm.Unchanged left lateral ventricle dilatation with evidence of transependymal flow.  S/pdecompressive hemicraniectomy (3/25)with Dr. Venetia MaxonStern  CT Head WO Contrast 3/26 620 - Improved midline shift and normalized left lateral ventricular volume after decompressive craniectomy. No progressive infarct or hemorrhage.  On keppra 500 mg bid  3% salineoff  Na 137->141->147->150>158>150> 141>139>140>139  Fever,  resolved Leukocytosis  Tmax 101.7->102.5> 98.6> 98.7>99.9>99.6  WBC - 16.2>15.7>16.8>18.6  Consider TEE once stable to rule out cardioembolic source  UA (4/3) neg  CXR-left perihilar atelectasis  Respiratory Culture (3/26) - Strep Pneumoniae  Blood CxNo growth after5days drawn 3/25  Completed 7 day course of Ceftriaxone on 4/2  Tachycardia and tachypnea Leukocytosis  WBC - 16.2>15.7>16.8>18.6  Likely aspiration pneumonia  Sputum culture (4/3) pending   UA: negative  ABG: ph 7.4 pCO2 40 pao2 155    LE venous doppler repeat (4/3): No evidence of deep vein thrombosis   May consider CTA per PE protocol if concerning for PE  R critical limb ischemia  S/p thrombectomy (3/24) for right brachial artery occlusion  Now Right hand pain, cool to touch, dry eschar wound on the palmar region, decreased dopplers per RN, and tachycardia, tachypnea with leukocytosis  UE arterial duplex (4/3) - right ulnar artery occlusion.   VVS Dr. Chestine Sporelark - No surgical intervention at this time.  Recommends medical management with Heparin infusion.    Discussed with Dr. Yetta BarreJones with Neurosurgery ok'd the initiation of Heparin  Heparin infusion started on 4/3  Heparin level 0.3-0.5 units/ml  Increase Heparin to 1100 units/hr    Percocet 5/325 q 6hr prn for right hand pain  Hyperlipidemia  Home meds:none  LDL 99, goal < 70  On lipitor 40 now  Continue statin at discharge  Poorly controlled Diabetes type II, with hyperglycemia   Home meds:Metformin, Glargine, Aspart  HgbA1c 10.6, goal < 7.0  CBGs  SSI  Levemir to 35 units bid  Novolog 6 units every 4 hours  Cocaine abuse  UDS positive for cocaine  Cessation education will be provided  Tobacco abuse  Current smoker  Smoking cessation counselingwill beprovided  Other Stroke Risk Factors  Substance abuse - UDS: THC POSITIVEWill advise patientto stop using due to stroke risk.  Other Active  Problems  Sick thyroid?- TSH: 0.307 (Low) Free T4:1.27 (high)    Hospital day # 12   ATTENDING NOTE: I reviewed above note and agree with the assessment and plan. Pt was seen and examined.   Patient neurologically stable, no neuro changes, still lethargic, left hemiplegia with left facial droop and left hemianopia.  Right gaze preference, barely cross midline to the left.  Able to follow simple commands, able to answer orientation questions with dysarthric voice.  Right hand warmer than yesterday in the palm area but still cold in the fingers.  Seems to be less painful in the fingers except right thumb.  Dry necrotic scar around right palm unchanged.  Patient is on heparin IV.  Still has persistent tachycardia and tachypnea with mildly worsening leukocytosis.  Concerning this changes is associated with right hand ischemic limb.  Skull incision dry clean.  LA 1.1.  Continue heparin IV at this time and aggressive PT/OT.  For detailed assessment and plan, please refer to above as I have made changes wherever appropriate.   Marvel Plan, MD PhD Stroke Neurology 07/16/2020 2:40 PM  This patient is critically ill due to large right MCA stroke with hemorrhagic conversion, status post craniectomy, right brachial artery occlusion status post thrombectomy, right hand ischemic limb, tachycardia, tachypnea and leukocytosis and at significant risk of neurological worsening, death form recurrent stroke, hemorrhagic conversion, cerebral edema, brain herniation, ischemic limb with sepsis and septic shock. This patient's care requires constant monitoring of vital signs, hemodynamics, respiratory and cardiac monitoring, review of multiple databases, neurological assessment, discussion with family, other specialists and medical decision making of high complexity. I spent 40 minutes of neurocritical care time in the care of this patient.   To contact Stroke Continuity provider, please refer to [email or web address]. After  hours, contact General Neurology

## 2020-07-16 NOTE — Progress Notes (Signed)
  Speech Language Pathology Treatment: Dysphagia;Cognitive-Linquistic  Patient Details Name: Brandi Myers MRN: [id number] DOB: 1985/12/27 Today's Date: 07/16/2020 Time: 6226-3335 SLP Time Calculation (min) (ACUTE ONLY): 25 min  Assessment / Plan / Recommendation Clinical Impression  Pt awake, slight drowsiness and participatory exhibiting decreased awareness and control of saliva noted from right as pt keeps head is tilted/turned to right, neglecting left side of therapist/environment. She is aware of environment and reason for hospitalization but lacks insight reasoning. Frequently requested to leave the hospital and drive to see her children. Intellectually unable to state deficits resulting from stroke.  Suspect ability to protect her airway during swallow is suboptimal, primarily with water which resulted in immediate cough and throat clear with pudding. Volitional throat clear is moderate in intensity but ineffective to fully clear audible mucous. At times oral movement and vocal intensity was reduced and speech difficult to decipher. Pt needs instrumental swallow assessment and recommend performing when she is more aware and able to manage  intermittent oral secretions and stronger cough which suspected to be in 1-2 days. Will plan to see tomorrow.    HPI HPI: 35 yo female admitted with L sided weakness, given TPA for R MCA involving basal ganglia, frontal, temporal lobes  3/23 s/p BIL revascularization  R MCA ACA achieving 2B revascularization. Pt intubated 3/23-31. 3/24 R UE thrombectomy. CT revealed worsening midline shift. 3/25 decompressive hemicranitectomy, R side bone flap in abdomen. PMH current smoker uncontrolled DM HTn      SLP Plan  Continue with current plan of care       Recommendations  Diet recommendations: NPO Medication Administration: Via alternative means                General recommendations: Rehab consult Oral Care Recommendations: Oral care QID Follow  up Recommendations: Inpatient Rehab SLP Visit Diagnosis: Dysphagia, unspecified (R13.10);Dysarthria and anarthria (R47.1);Cognitive communication deficit (K56.256) Plan: Continue with current plan of care                       Royce Macadamia 07/16/2020, 1:19 PM  Breck Coons Lonell Face.Ed Nurse, children's [phone] Office [phone]

## 2020-07-17 DIAGNOSIS — Z9889 Other specified postprocedural states: Secondary | ICD-10-CM | POA: Diagnosis not present

## 2020-07-17 DIAGNOSIS — R1319 Other dysphagia: Secondary | ICD-10-CM | POA: Diagnosis not present

## 2020-07-17 DIAGNOSIS — I6601 Occlusion and stenosis of right middle cerebral artery: Secondary | ICD-10-CM | POA: Diagnosis not present

## 2020-07-17 DIAGNOSIS — F05 Delirium due to known physiological condition: Secondary | ICD-10-CM

## 2020-07-17 DIAGNOSIS — G936 Cerebral edema: Secondary | ICD-10-CM | POA: Diagnosis not present

## 2020-07-17 DIAGNOSIS — G8194 Hemiplegia, unspecified affecting left nondominant side: Secondary | ICD-10-CM

## 2020-07-17 LAB — GLUCOSE, CAPILLARY
Glucose-Capillary: 109 mg/dL — ABNORMAL HIGH (ref 70–99)
Glucose-Capillary: 146 mg/dL — ABNORMAL HIGH (ref 70–99)
Glucose-Capillary: 164 mg/dL — ABNORMAL HIGH (ref 70–99)
Glucose-Capillary: 170 mg/dL — ABNORMAL HIGH (ref 70–99)
Glucose-Capillary: 171 mg/dL — ABNORMAL HIGH (ref 70–99)
Glucose-Capillary: 181 mg/dL — ABNORMAL HIGH (ref 70–99)
Glucose-Capillary: 213 mg/dL — ABNORMAL HIGH (ref 70–99)
Glucose-Capillary: 239 mg/dL — ABNORMAL HIGH (ref 70–99)

## 2020-07-17 LAB — HEPARIN LEVEL (UNFRACTIONATED)
Heparin Unfractionated: <0.1 IU/mL — ABNORMAL LOW (ref 0.30–0.70)
Heparin Unfractionated: 0.11 IU/mL — ABNORMAL LOW (ref 0.30–0.70)
Heparin Unfractionated: <0.1 IU/mL — ABNORMAL LOW (ref 0.30–0.70)
Heparin Unfractionated: 0.11 IU/mL — ABNORMAL LOW (ref 0.30–0.70)

## 2020-07-17 LAB — CBC
HCT: 35.5 % — ABNORMAL LOW (ref 36.0–46.0)
Hemoglobin: 9.6 g/dL — ABNORMAL LOW (ref 12.0–15.0)
MCH: 21.3 pg — ABNORMAL LOW (ref 26.0–34.0)
MCHC: 27 g/dL — ABNORMAL LOW (ref 30.0–36.0)
MCV: 78.7 fL — ABNORMAL LOW (ref 80.0–100.0)
Platelets: 626 10*3/uL — ABNORMAL HIGH (ref 150–400)
RBC: 4.51 MIL/uL (ref 3.87–5.11)
RDW: 28.3 % — ABNORMAL HIGH (ref 11.5–15.5)
WBC: 21.5 10*3/uL — ABNORMAL HIGH (ref 4.0–10.5)
nRBC: 0 % (ref 0.0–0.2)

## 2020-07-17 LAB — BASIC METABOLIC PANEL
Anion gap: 13 (ref 5–15)
BUN: 11 mg/dL (ref 6–20)
CO2: 24 mmol/L (ref 22–32)
Calcium: 9.3 mg/dL (ref 8.9–10.3)
Chloride: 101 mmol/L (ref 98–111)
Creatinine, Ser: 0.57 mg/dL (ref 0.44–1.00)
GFR, Estimated: >60 mL/min (ref >60)
Glucose, Bld: 155 mg/dL — ABNORMAL HIGH (ref 70–99)
Potassium: 4 mmol/L (ref 3.5–5.1)
Sodium: 138 mmol/L (ref 135–145)

## 2020-07-17 MED ORDER — OXYCODONE-ACETAMINOPHEN 5-325 MG PO TABS
1.0000 | ORAL_TABLET | Freq: Four times a day (QID) | ORAL | Status: DC | PRN
  Administered 2020-07-19: 1
  Filled 2020-07-17: qty 1

## 2020-07-17 MED ORDER — INSULIN DETEMIR 100 UNIT/ML ~~LOC~~ SOLN
35.0000 [IU] | Freq: Two times a day (BID) | SUBCUTANEOUS | Status: DC
  Administered 2020-07-18 – 2020-08-03 (×31): 35 [IU] via SUBCUTANEOUS
  Filled 2020-07-17 (×34): qty 0.35

## 2020-07-17 NOTE — Consult Note (Signed)
Physical Medicine and Rehabilitation Consult  Reason for Consult: Functional deficits due to R MCA stroke s/p crani.  Referring Physician: Dr. Roda Shutters   HPI: Brandi Myers is a 35 y.o. female with history of HTN, T2DM polysubstance abuse who was admitted on 07/04/20 after with left sided weakness and slurred speech. Patient had not been acting right that day, went to shower and found an hour later at the bottom of the tub. UDS positive for THC and cocaine. 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 She received tPA and noted to have some vaginal bleeding post tPA infusion. She was combative in ED requiring sedation and intubation. She underwent endovascular revascularization of occluded R-ICA, R-MCA and R- ACA with post contrast petechial hemorrhage on follow up CT.  She ws found to have ischemic RUE and underwent RUE thrombectomy by Dr. Chestine Spore on 03/24. Follow up MRI showed hemorrhagic conversion and serial CT head showed worsening of mass effect with 9 mm right to left midline shift. She was started on hypertonic saline and underwent right crani with placement of bone flap in abdomen on 03/25 by Dr. Venetia Maxon.  She has had issues with persistent high fevers felt to be central and monitored off antibiotics initially. She had difficulty with vent wean and found to have VAP treated therefore started on Ceftriaxone for MSSA/Strep PNA.  Acute on chronic anemia with Hgb 6.6 was treated with transfusion. Mentation has slowly improved with increase in responsiveness and she tolerated extubation on 03/31. Dr. Chestine Spore has been following for coolness right forearm and she started reporting pain right hand with palmar eschar. 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 Heparin started 04/03 per NS clearance with plans to  monitor for improvement but felt to be at risk for losing fingers over time. Therapy evaluations have been completed revealing right gaze deviation, dense left hemiplegia, dysphagia--remains NPO as well as delay in processing and verbal output. CIR recommended due to functional decline.    Per SLP in the room, pt has been asleep while she has been in here- also her MEWS score is 4 currently at 2:30 pm when seen.    Review of Systems  Unable to perform ROS: Mental acuity      Past Medical History:  Diagnosis Date  . Diabetes mellitus without complication (HCC)   . Hypertension     Past Surgical History:  Procedure Laterality Date  . CESAREAN SECTION    . CHOLECYSTECTOMY    . CRANIOTOMY Right 07/06/2020   Procedure: RIGHT CRANIECTOMY WITH PLACEMENT OF BONE FLAP IN ABDOMEN;  Surgeon: Maeola Harman, MD;  Location[facility] OR;  Service: Neurosurgery;  Laterality: Right;  . IR PERCUTANEOUS ART THROMBECTOMY/INFUSION INTRACRANIAL INC DIAG ANGIO  07/04/2020  . RADIOLOGY WITH ANESTHESIA N/A 07/04/2020   Procedure: IR WITH ANESTHESIA;  Surgeon: Radiologist, Medication, MD;  Location: [facility] OR;  Service: Radiology;  Laterality: N/A;  . THROMBECTOMY BRACHIAL ARTERY Right 07/05/2020   Procedure: RIGHT UPPER EXTREMITY THROMBECTOMY;  Surgeon: Cephus Shelling, MD;  Location: [facility] OR;  Service: Vascular;  Laterality: Right;    Family History  Problem Relation Age of Onset  . Diabetes Maternal Grandmother     Social History:  Lives alone. Per reports that she has been smoking cigarettes. She has been smoking about 0.10 packs per  day. She has never used smokeless tobacco. Per reports current alcohol use and was using marijuana PTA. Sister died in 2023/01/27 of overdose.    Allergies  Allergen Reactions  . Norco [Hydrocodone-Acetaminophen] Itching, Nausea And Vomiting and Swelling    No medications prior to admission.    Home: Home Living Family/patient expects to be discharged to:: Inpatient  rehab Living Arrangements: Parent Additional Comments: no family present / pt intubated impairing communication  Functional History: Prior Function Level of Independence: Independent Functional Status:  Mobility: Bed Mobility Overal bed mobility: Needs Assistance Bed Mobility: Supine to Sit Rolling: +2 for physical assistance,Max assist,+2 for safety/equipment Supine to sit: +2 for physical assistance,Max assist Sit to supine: Max assist,+2 for physical assistance General bed mobility comments: Pt able to maneuver RLE to edge of bed, assist for trunk to upright Transfers Overall transfer level: Needs assistance Equipment used: None Transfers: Sit to/from Stand Sit to Stand: Total assist General transfer comment: Attempted standing from edge of bed x 2, pt able to minimally clear hips, no initiation noted. Unable to grip onto back of recliner with RUE in order to facilitate pulling up.      ADL: ADL Overall ADL's : Needs assistance/impaired Eating/Feeding: NPO Grooming: Maximal assistance,Sitting Grooming Details (indicate cue type and reason): attempting to help pt to wash face due to coughing and drainage General ADL Comments: pt with flexiseal with large volume of out put during session noted. Pt requires total (A) for all adls at this time  Cognition: Cognition Overall Cognitive Status: Difficult to assess Arousal/Alertness: Awake/alert Orientation Level: Oriented to place,Oriented to person Attention: Sustained Sustained Attention: Impaired Sustained Attention Impairment: Verbal basic Memory: Impaired Memory Impairment: Storage deficit Awareness: Impaired Awareness Impairment: Intellectual impairment Problem Solving: Impaired Problem Solving Impairment: Verbal basic,Functional basic Behaviors: Restless,Impulsive Safety/Judgment: Impaired Cognition Arousal/Alertness: Lethargic Behavior During Therapy: Restless,Flat affect,Impulsive Overall Cognitive Status:  Difficult to assess General Comments: Pt A&Ox3, pt looking at her right arm and stating, "I look like a heroin addict." Follows right sided commands inconsistently. Poor safety awareness Difficult to assess due to: Intubated   Blood pressure 130/89, pulse (!) 108, temperature 98.6 F (37 C), temperature source Axillary, resp. rate 20, height 5\' 5"  (1.651 m), weight 70.5 kg, SpO2 96 %. Physical Exam Vitals and nursing note reviewed. Exam conducted with a chaperone present.  Constitutional:      Appearance: She is normal weight.     Comments: Slumped to the left with right leg over the right rail and mitten on right hand.   Pt completely asleep- not waking to minor tactile stimuli, but moving her extremities; SLP x2 in room, NAD; moving around a LOT in bed, while asleep Sitting u in bed 30-40 degrees, but SLP putting her higher to help with secretions- LUE held in weird position which appears uncomfortable.   HENT:     Head:     Comments: Right crani incision C/D/I with staples in place and resolving ecchymosis.   Crani goes around R ear, and up to R frontal area- staples intact, no drainage, no erythema Asleep, couldn't tell if had facial droop Drooling- couldn't handle secretions cortrak in place- is NPO    Right Ear: External ear normal.     Left Ear: External ear normal.     Nose: Nose normal. No congestion.     Mouth/Throat:     Mouth: Mucous membranes are dry.     Pharynx: Oropharynx is clear.  Eyes:     Comments: Eyes closed-  would not open them  Cardiovascular:     Rate and Rhythm: Tachycardia present.     Heart sounds: Normal heart sounds. No murmur heard. No gallop.      Comments: Regular rhythm, but rate 118-124 while in the room, while pt asleep-  Pulmonary:     Comments: Coarse breath sounds and frequent coughing when turns her head, again asleep A little wheezy sounds, but no rales or rhonchi heard- adequate air movement B/L Abdominal:     Comments: Soft, NT, ND,  incision from abdominal placement of bone flap (+) normoactive BS- of note, TFs finished right now and are turned off  Musculoskeletal:     Cervical back: Normal range of motion and neck supple.     Comments: Right hand in mitten--painful to touch   R mitten in mitten- constantly moving RUE and RLE- appears pretty close to normal strength, but cannot interact to know for sure Is moving LUE/LLE to some extent- when asleep and to minor tactile stimuli- does withdraw mainly  Skin:    General: Skin is warm and dry.     Comments: LUE IV x2- look OK  Neurological:     Mental Status: She is lethargic.     Comments: Soft voice with dysphonia and mumbling speech. She was able to state her name, DOB and age but thought she was in an apartment and hallucinating--reported that brother/cousin was in the room on the left. Right visual field deficits and dense left hemiplegia.   When I saw, was asleep- was noted ot have dense L hemiplegia, but is moving some on L side.  Fighting me when attempted to move LUE- vs tone- appeared to be more fighting exam  Psychiatric:     Comments: asleep sedated     Results for orders placed or performed during the hospital encounter of 07/04/20 (from the past 24 hour(s))  Glucose, capillary     Status: Abnormal   Collection Time: 07/16/20 11:37 AM  Result Value Ref Range   Glucose-Capillary 200 (H) 70 - 99 mg/dL  Glucose, capillary     Status: Abnormal   Collection Time: 07/16/20  3:00 PM  Result Value Ref Range   Glucose-Capillary 139 (H) 70 - 99 mg/dL  Heparin level (unfractionated)     Status: Abnormal   Collection Time: 07/16/20  3:41 PM  Result Value Ref Range   Heparin Unfractionated <0.10 (L) 0.30 - 0.70 IU/mL  Glucose, capillary     Status: Abnormal   Collection Time: 07/16/20  8:44 PM  Result Value Ref Range   Glucose-Capillary 120 (H) 70 - 99 mg/dL  Basic metabolic panel     Status: Abnormal   Collection Time: 07/17/20 12:04 AM  Result Value Ref  Range   Sodium 138 135 - 145 mmol/L   Potassium 4.0 3.5 - 5.1 mmol/L   Chloride 101 98 - 111 mmol/L   CO2 24 22 - 32 mmol/L   Glucose, Bld 155 (H) 70 - 99 mg/dL   BUN 11 6 - 20 mg/dL   Creatinine, Ser 6.04 0.44 - 1.00 mg/dL   Calcium 9.3 8.9 - 54.0 mg/dL   GFR, Estimated >98 >11 mL/min   Anion gap 13 5 - 15  CBC     Status: Abnormal   Collection Time: 07/17/20 12:04 AM  Result Value Ref Range   WBC 21.5 (H) 4.0 - 10.5 K/uL   RBC 4.51 3.87 - 5.11 MIL/uL   Hemoglobin 9.6 (L) 12.0 - 15.0 g/dL  HCT 35.5 (L) 36.0 - 46.0 %   MCV 78.7 (L) 80.0 - 100.0 fL   MCH 21.3 (L) 26.0 - 34.0 pg   MCHC 27.0 (L) 30.0 - 36.0 g/dL   RDW 63.8 (H) 45.3 - 64.6 %   Platelets 626 (H) 150 - 400 K/uL   nRBC 0.0 0.0 - 0.2 %  Heparin level (unfractionated)     Status: Abnormal   Collection Time: 07/17/20 12:04 AM  Result Value Ref Range   Heparin Unfractionated <0.10 (L) 0.30 - 0.70 IU/mL  Glucose, capillary     Status: Abnormal   Collection Time: 07/17/20 12:30 AM  Result Value Ref Range   Glucose-Capillary 164 (H) 70 - 99 mg/dL  Glucose, capillary     Status: Abnormal   Collection Time: 07/17/20  3:41 AM  Result Value Ref Range   Glucose-Capillary 181 (H) 70 - 99 mg/dL  Glucose, capillary     Status: Abnormal   Collection Time: 07/17/20  6:31 AM  Result Value Ref Range   Glucose-Capillary 170 (H) 70 - 99 mg/dL  Heparin level (unfractionated)     Status: Abnormal   Collection Time: 07/17/20  6:59 AM  Result Value Ref Range   Heparin Unfractionated 0.11 (L) 0.30 - 0.70 IU/mL  Glucose, capillary     Status: Abnormal   Collection Time: 07/17/20  8:25 AM  Result Value Ref Range   Glucose-Capillary 171 (H) 70 - 99 mg/dL    DG CHEST PORT 1 VIEW  Result Date: 07/16/2020 CLINICAL DATA:  Eval for aspiration EXAM: PORTABLE CHEST 1 VIEW COMPARISON:  July 13, 2020 FINDINGS: The heart size and mediastinal contours are within normal limits. Slight interval improvement in the left perihilar airspace  opacities. The right lung is clear. No pleural effusion. Enteric tube is seen below the diaphragm. IMPRESSION: Slight interval improvement in the left perihilar airspace opacities Electronically Signed   By: Jonna Clark M.D.   On: 07/16/2020 18:45   VAS [place] UPPER EXTREMITY ARTERIAL DUPLEX  Result Date: 07/15/2020 UPPER EXTREMITY DUPLEX STUDY Indications: Patient complains of Cool, purple fingers, wound on right palm,              recent right upper extremity thrombectomy. History:     Patient has a history of upper extremity pain.  Limitations: Patient confusion, constant movement of right arm. Comparison Study: Prior study done 07/05/20 Performing Technologist: Sherren Kerns RVS  Examination Guidelines: A complete evaluation includes B-mode imaging, spectral Doppler, color Doppler, and power Doppler as needed of all accessible portions of each vessel. Bilateral testing is considered an integral part of a complete examination. Limited examinations for reoccurring indications may be performed as noted.  Right Doppler Findings: +---------------+----------+-------------------+--------+------------------+ Site           PSV (cm/s)Waveform           StenosisComments           +---------------+----------+-------------------+--------+------------------+ Subclavian Prox52        triphasic                                     +---------------+----------+-------------------+--------+------------------+ Subclavian Mid -49       triphasic                                     +---------------+----------+-------------------+--------+------------------+ Subclavian Dist  triphasic                                     +---------------+----------+-------------------+--------+------------------+ Axillary       48        triphasic                                     +---------------+----------+-------------------+--------+------------------+ Brachial Prox  37        triphasic                                      +---------------+----------+-------------------+--------+------------------+ Brachial Mid   31        triphasic                                     +---------------+----------+-------------------+--------+------------------+ Brachial Dist  -29       triphasic                                     +---------------+----------+-------------------+--------+------------------+ Radial Mid     60        triphasic                                     +---------------+----------+-------------------+--------+------------------+ Radial Dist    53        triphasic                                     +---------------+----------+-------------------+--------+------------------+ Ulnar Mid                dampened monophasic        partially occluded +---------------+----------+-------------------+--------+------------------+ Palmar Arch              dampened monophasic        minimal flow noted +---------------+----------+-------------------+--------+------------------+    Summary:  Right: Right ulnar artery appears occluded. *See table(s) above for measurements and observations. Electronically signed by Sherald Hesshristopher Clark MD on 07/15/2020 at 4:21:03 PM.    Final    VAS US LOWER EXTREMITY VENOUS (DVT)  Result Date: 07/15/2020  Lower Venous DVT Study Indications: Tachycardia, tachypnea. Stroke.  Comparison Study: Prior negative BLEV done 07/05/20 Performing Technologist: Sherren Kernsandace Kanady RVS  Examination Guidelines: A complete evaluation includes B-mode imaging, spectral Doppler, color Doppler, and power Doppler as needed of all accessible portions of each vessel. Bilateral testing is considered an integral part of a complete examination. Limited examinations for reoccurring indications may be performed as noted. The reflux portion of the exam is performed with the patient in reverse Trendelenburg.  +---------+---------------+---------+-----------+----------+--------------+ RIGHT     CompressibilityPhasicitySpontaneityPropertiesThrombus Aging +---------+---------------+---------+-----------+----------+--------------+ CFV      Full           Yes      Yes                                 +---------+---------------+---------+-----------+----------+--------------+ SFJ  Full                                                        +---------+---------------+---------+-----------+----------+--------------+ FV Prox  Full                                                        +---------+---------------+---------+-----------+----------+--------------+ FV Mid   Full                                                        +---------+---------------+---------+-----------+----------+--------------+ FV DistalFull                                                        +---------+---------------+---------+-----------+----------+--------------+ PFV      Full                                                        +---------+---------------+---------+-----------+----------+--------------+ POP      Full           Yes      Yes                                 +---------+---------------+---------+-----------+----------+--------------+ PTV      Full                                                        +---------+---------------+---------+-----------+----------+--------------+ PERO     Full                                                        +---------+---------------+---------+-----------+----------+--------------+   +---------+---------------+---------+-----------+----------+--------------+ LEFT     CompressibilityPhasicitySpontaneityPropertiesThrombus Aging +---------+---------------+---------+-----------+----------+--------------+ CFV      Full           Yes      Yes                                 +---------+---------------+---------+-----------+----------+--------------+ SFJ      Full                                                         +---------+---------------+---------+-----------+----------+--------------+  FV Prox  Full                                                        +---------+---------------+---------+-----------+----------+--------------+ FV Mid   Full                                                        +---------+---------------+---------+-----------+----------+--------------+ FV DistalFull                                                        +---------+---------------+---------+-----------+----------+--------------+ PFV      Full                                                        +---------+---------------+---------+-----------+----------+--------------+ POP      Full           Yes      Yes                                 +---------+---------------+---------+-----------+----------+--------------+ PTV      Full                                                        +---------+---------------+---------+-----------+----------+--------------+ PERO     Full                                                        +---------+---------------+---------+-----------+----------+--------------+     Summary: BILATERAL: - No evidence of deep vein thrombosis seen in the lower extremities, bilaterally. -   *See table(s) above for measurements and observations. Electronically signed by Sherald Hess MD on 07/15/2020 at 4:20:07 PM.    Final      Assessment/Plan: Diagnosis: R mCA infarct with hemorrhagic conversion/s/p R carni with L hemiparesis, confusion, sedation and dysphagia as well as dysphonia per chart.  1. Does the need for close, 24 hr/day medical supervision in concert with the patient's rehab needs make it unreasonable for this patient to be served in a less intensive setting? Yes 2. Co-Morbidities requiring supervision/potential complications: DM, HTN, s/p R crani as above; confusion; sedation, visual field deficits, per chart 3. Due to bladder  management, bowel management, safety, skin/wound care, disease management, medication administration, pain management and patient education, does the patient require 24 hr/day rehab nursing? Yes 4. Does the patient require coordinated care of a physician, rehab nurse, therapy disciplines of PT, OT and SLP to address physical and  functional deficits in the context of the above medical diagnosis(es)? Yes Addressing deficits in the following areas: balance, endurance, locomotion, strength, transferring, bowel/bladder control, bathing, dressing, feeding, grooming, toileting, cognition, speech, swallowing and psychosocial support 5. Can the patient actively participate in an intensive therapy program of at least 3 hrs of therapy per day at least 5 days per week? Yes 6. The potential for patient to make measurable gains while on inpatient rehab is good and fair 7. Anticipated functional outcomes upon discharge from inpatient rehab are min assist  with PT, min assist with OT, min assist and mod assist with SLP. 8. Estimated rehab length of stay to reach the above functional goals is: 3-4 weeks 9. Anticipated discharge destination: Home 10. Overall Rehab/Functional Prognosis: good and fair  RECOMMENDATIONS: This patient's condition is appropriate for continued rehabilitative care in the following setting: CIR Patient has agreed to participate in recommended program. Potentially Note that insurance prior authorization may be required for reimbursement for recommended care.  Comment:  1. Pt eventually could be appropriate for CIR, however don't have any info on family ability to care for her, esp physical 24/7, which would be required.  No significant mention of social hx/family support, except mentions "parents" in therapy note. Will have admissions coordinators look into this.  2. Pt's MEWS score was 4 this afternoon- as I write note, was down to 1- will con't to monitor 3. Suggest Amantadine 100 mg daily  via tube to help her wake up some- I understand she's a stroke, but trying to wake pt up, she appears somewhat like a BI and Amantadine can be helpful for this pt population.  4. Pt not quite ready for CIR, but will will con't to follow remotely and see if we can get her to CIR soon. 5. Thank you for this consult.    Jacquelynn Cree, PA-C 07/17/2020    I have personally performed a face to face diagnostic evaluation of this patient and formulated the key components of the plan.  Additionally, I have personally reviewed laboratory data, imaging studies, as well as relevant notes and concur with the physician assistant's documentation above.

## 2020-07-17 NOTE — Progress Notes (Signed)
SLP Cancellation Note  Patient Details Name: VALOR TURBERVILLE MRN: [id number] DOB: 1985-06-20   Cancelled treatment:        Intermittently responsive to questions but unable to adequately stay awake for trial with PO. Continue efforts tomorrow.    Royce Macadamia 07/17/2020, 4:24 PM

## 2020-07-17 NOTE — Progress Notes (Signed)
Physical Therapy Treatment Patient Details Name: Brandi Myers MRN: [id number] DOB: Oct 04, 1985 Today's Date: 07/17/2020    History of Present Illness 35 yo female admitted with L sided weakness givent TPA for R MCA involving basal ganglia frontal temporal lobes  3/23 s/p BIL revascularization  R MCA ACA achieving 2B revascularization PT intubated starting 3/23 . 3/24 R UE thromectomy CT reveals worsening midline shift start 3% saline 3/25 decompressive hemicranitectomy R side bone flap in abdomen 3/25 fever 103. Extubated 3/31. PMH current smoker uncontrolled DM HTn    PT Comments    Pt remains lethargic for majority of session, requiring verbal cues to open eyes intermittently. Pt requires physical assistance for all functional mobility at this time and remains flaccid on L side. Pt reports pain in R wrist, yet proceeds to weight bear through posterior R wrist despite PT/OT cues. Pt continues to demonstrate L neglect, unable to identify the pt's own hand when presented in visual field. Pt will continue to benefit from acute PT POC to reduce caregiver burden and improve functional mobility quality.  Follow Up Recommendations  CIR (pt will need to demonstrate improved participation and endurance to become a good CIR candidate)     Equipment Recommendations  Wheelchair (measurements PT);Wheelchair cushion (measurements PT);Hospital bed (mechanical lift)    Recommendations for Other Services       Precautions / Restrictions Precautions Precautions: Fall Precaution Comments: bone flap out in R abdomen, L hemiparesis, NG tube Restrictions Weight Bearing Restrictions: No    Mobility  Bed Mobility Overal bed mobility: Needs Assistance Bed Mobility: Supine to Sit;Sit to Supine     Supine to sit: +2 for physical assistance;Total assist Sit to supine: Max assist;+2 for physical assistance;Total assist   General bed mobility comments: Pt was Total assist to maintain sitting balance.     Transfers Overall transfer level: Needs assistance Equipment used: 2 person hand held assist Transfers: Sit to/from Stand Sit to Stand: Max assist;+2 physical assistance         General transfer comment: Pt. was able to stand wtih 2 person assist for 2 attempts.  Ambulation/Gait                 Stairs             Wheelchair Mobility    Modified Rankin (Stroke Patients Only) Modified Rankin (Stroke Patients Only) Pre-Morbid Rankin Score: No symptoms Modified Rankin: Severe disability     Balance Overall balance assessment: Needs assistance Sitting-balance support: Single extremity supported;Feet supported Sitting balance-Leahy Scale: Zero Sitting balance - Comments: maxA, preference for R lateral lean Postural control: Right lateral lean;Posterior lean   Standing balance-Leahy Scale: Zero Standing balance comment: maxA x2                            Cognition Arousal/Alertness: Lethargic Behavior During Therapy: Flat affect Overall Cognitive Status: Impaired/Different from baseline Area of Impairment: Attention;Following commands;Problem solving;Awareness;Safety/judgement                   Current Attention Level: Focused   Following Commands: Follows one step commands with increased time;Follows one step commands inconsistently Safety/Judgement: Decreased awareness of safety;Decreased awareness of deficits Awareness: Intellectual Problem Solving: Slow processing;Decreased initiation;Difficulty sequencing;Requires verbal cues;Requires tactile cues General Comments: Pt. was nodding and shaking head to questions.      Exercises      General Comments General comments (skin integrity, edema, etc.): pt tachy throughout  session, 120s at rest up to 130s with mobility. Poor management of secretions      Pertinent Vitals/Pain Pain Assessment: Faces Faces Pain Scale: Hurts even more Pain Location: R wrist Pain Descriptors /  Indicators: Guarding Pain Intervention(s): Monitored during session    Home Living                      Prior Function            PT Goals (current goals can now be found in the care plan section) Acute Rehab PT Goals Patient Stated Goal: none stated Progress towards PT goals: Progressing toward goals (very slowly)    Frequency    Min 4X/week      PT Plan Current plan remains appropriate    Co-evaluation PT/OT/SLP Co-Evaluation/Treatment: Yes Reason for Co-Treatment: Complexity of the patient's impairments (multi-system involvement);Necessary to address cognition/behavior during functional activity;For patient/therapist safety;To address functional/ADL transfers PT goals addressed during session: Mobility/safety with mobility;Balance;Strengthening/ROM        AM-PAC PT "6 Clicks" Mobility   Outcome Measure  Help needed turning from your back to your side while in a flat bed without using bedrails?: Total Help needed moving from lying on your back to sitting on the side of a flat bed without using bedrails?: Total Help needed moving to and from a bed to a chair (including a wheelchair)?: Total Help needed standing up from a chair using your arms (e.g., wheelchair or bedside chair)?: A Lot Help needed to walk in hospital room?: Total Help needed climbing 3-5 steps with a railing? : Total 6 Click Score: 7    End of Session   Activity Tolerance: Patient tolerated treatment well Patient left: in bed;with call bell/phone within reach;with bed alarm set Nurse Communication: Mobility status;Need for lift equipment PT Visit Diagnosis: Other abnormalities of gait and mobility (R26.89);Hemiplegia and hemiparesis;Other symptoms and signs involving the nervous system (R29.898);Muscle weakness (generalized) (M62.81);Difficulty in walking, not elsewhere classified (R26.2) Hemiplegia - Right/Left: Left Hemiplegia - dominant/non-dominant: Non-dominant Hemiplegia - caused by:  Cerebral infarction     Time: 1120-1147 PT Time Calculation (min) (ACUTE ONLY): 27 min  Charges:  $Therapeutic Activity: 8-22 mins                     Arlyss Gandy, PT, DPT Acute Rehabilitation Pager: [phone]    Arlyss Gandy 07/17/2020, 12:52 PM

## 2020-07-17 NOTE — Progress Notes (Signed)
STROKE TEAM PROGRESS NOTE   INTERVAL HISTORY No family at bedside.  Patient lying in bed, still has left hemiplegia, right gaze preference.  Positive speech, severe dysarthria.  Right hand stable, seems warmer in the fingers than yesterday, still has right thumb mild discoloration with dry eschar on the palm area.  Tenderness to touch on the right thumb and palm eschar.   Vitals:   07/17/20 0629 07/17/20 0700 07/17/20 1100 07/17/20 1500  BP: 136/86 130/89 (!) 140/96 132/84  Pulse: (!) 109 (!) 108 (!) 105 (!) 115  Resp: (!) 22 20 (!) 22 (!) 25  Temp: 98.4 F (36.9 C) 98.6 F (37 C)    TempSrc: Axillary Axillary    SpO2:  96% 99% 98%  Weight:      Height:       CBC:  Recent Labs  Lab 07/16/20 0654 07/17/20 0004  WBC 18.6* 21.5*  HGB 9.7* 9.6*  HCT 35.8* 35.5*  MCV 77.5* 78.7*  PLT 608* 626*   Basic Metabolic Panel:  Recent Labs  Lab 07/16/20 0654 07/17/20 0004  NA 139 138  K 3.9 4.0  CL 104 101  CO2 26 24  GLUCOSE 127* 155*  BUN 12 11  CREATININE 0.62 0.57  CALCIUM 9.2 9.3   Lipid Panel:  No results for input(s): CHOL, TRIG, HDL, CHOLHDL, VLDL, LDLCALC in the last 168 hours. HgbA1c: No results for input(s): HGBA1C in the last 168 hours. Urine Drug Screen: No results for input(s): LABOPIA, COCAINSCRNUR, LABBENZ, AMPHETMU, THCU, LABBARB in the last 168 hours.  Alcohol Level No results for input(s): ETH in the last 168 hours.  IMAGING past 24 hours CT HEAD WO CONTRAST  Result Date: 07/14/2020  IMPRESSION:  1. Evolving right MCA territory infarct with slightly decreased swelling and midline shift.  2. Improved sinus inflammation. 3. No new abnormality.    PHYSICAL EXAM Blood pressure 132/84, pulse (!) 115, temperature 98.6 F (37 C), temperature source Axillary, resp. rate (!) 25, height 5\' 5"  (1.651 m), weight 70.5 kg, SpO2 98 %.  General: caucasian female, no apparent distress.  Right craniotomy bony defect with staples.    Lungs: Symmetrical Chest rise, no  labored breathing  Cardio: Regular Rate and Rhythm  Abdomen: Soft, non-tender  Skin: right thumb mild discoloration with tenderness to touch, right fingers less cool, dry eschar wound on the palmar region unchanged with tender to touch.  Neuro - awake, alert, eyes open,lethargic with psychomotor slowing,orientated to age, place, but not to time. No aphasiabut paucity of speech, moderate to sever dysarthria, followingmost of thesimple commands.Right gaze preference, not cross midline, trackingon the right,not blinking to visual threat on the left, PERRL.Left facial droop. Tongue midline.RUE and RLE spontaneous movement against gravity and LUE flaccid and LLE slight withdraw to pain.Sensation and coordination not cooperative, gait not tested.    ASSESSMENT/PLAN Ms. JAQUELIN MEANEY is a 35 y.o. female with history of uncontrolled diabetesandhypertensionpresenting with left sided weakness and slurred speech.Per family there is a complicated social situation at this time. She lives intermittently in an apartment by herself but was kicked out and they went to pick her up at 5:30 AM. At that point she seemed "off"and generally quiet but did not seem to have any difficulty with left-sided weakness.Family attributed this to psychosocial issues and potential substance use noting that her sister passed away from an overdose in October. Later in the morning she complained of diffuse body pain and was administered gabapentin and a muscle relaxer.At 11:30 AM  she stood up and walkedto the showerand at1215 she was found on the bottom of the tub with slurred speech and difficulty moving the left side. She was not preactivated by EMS, but on arrival in the ED clearly appear to have a doorway examination consistent with a right MCA territory infarct.CTA revealed right ICA occlusion as well as a right common carotid bifurcation thrombus extending into the external carotid (felt to be the source of  the occlusion). CT perfusion imaging demonstrated possible core infarct of 51 mL (though notably this was obtained within 2 hours of symptom onset and therefore may reflect an overestimate of the true core). Mismatch ratio was 2 with a penumbra of 63 mL. Case was discussed with Dr. Corliss Skains of neuro interventional radiology and Dr. Otelia Limes of [facility] and the patient was accepted for transfer to [facility] for emergent intervention. She was also started on TPA after consenting the patient's emergency contact with full discussion of risks of bleeding and importance of accurately determining the patient's last known well.[name] confirm the last known well as described above and agreed that the patient would want TPA given the risk/benefit.She confirmed that the patient had no recent procedures or other contraindications to TPA.Regarding her baseline, she is functional at baseline and family believes she is smoking at least1pack a day currently. She is on Metformin for her diabetes, does not take oral contraceptive pills, has had no recent surgeries or procedures.   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   CT no acute finding but suspicious for right MCA early ischemic changes.   CTA head and neck showed right ICA terminal and proximal MCA occlusion, right CCA nonocclusive thrombus.  CT perfusion showed large penumbra.   Status post thrombectomy with right terminal ICA, right PCA and right ACA TICI2breperfusion however does have distal micro emboli.  MRI showed large right MCA infarct with 50mm MLS and hemorrhagic transformation.   MRA showed patent intracranial vessels.  CT  Head WO Contrast 3/26 620 - Improved midline shift and normalized left lateral ventricular volume after decompressive craniectomy. No progressive infarct or hemorrhage.  2D Echo- EF: 60-65%. No wall motion abnormality.  TCD Bubble - No PFO  Consider TEE Thursday  DVT US -No evidence of DVT bilaterally  LDL99  HgbA1c10.6  VTE prophylaxis -heparin IV  No antithromboticprior to admission, now on heparin IV  Therapy recommendations:CIR  Disposition:Pending  Cerebral edema  CT Headrepeat3/24 -Continued interval evolution of right MCA distribution infarct with associated hemorrhagic transformation. Worsened regional mass effect and edema with increased right-to-left midline shift now measuring up to 9 mm.Increased dilatation of the right lateral ventricle concerning for developing ventricular trapping. No other new acute intracranial abnormality.  CT repeat 3/25 unchangedright cerebral cytotoxic edema with central hemorrhage. Midline shift is similar at 1 cm.Unchanged left lateral ventricle dilatation with evidence of transependymal flow.  S/pdecompressive hemicraniectomy (3/25)with Dr. Venetia Maxon  CT Head WO Contrast 3/26 620 - Improved midline shift and normalized left lateral ventricular volume after decompressive craniectomy. No progressive infarct or hemorrhage.  On keppra 500 mg bid  3% salineoff  Na 137->141->147->150>158>150> 141>139>140>139  Fever, resolved Leukocytosis  Tmax 101.7->102.5> 98.6> 98.7>99.9>afebrile  WBC - 16.2>15.7>16.8>18.6->21.5  Consider TEE Thursday to rule out cardioembolic source  UA (4/3) neg  CXR-left perihilar atelectasis  Respiratory Culture (3/26) - Strep Pneumoniae  Blood CxNo growth after5days drawn 3/25  Completed 7 day course of Ceftriaxone on 4/2  Tachycardia and tachypnea Leukocytosis  WBC - 16.2>15.7>16.8>18.6->21.5  Likely related to ischemic right hand  UA: negative  ABG: ph 7.4 pCO2 40 pao2  155    LE venous doppler repeat (4/3): No evidence of deep vein thrombosis   R critical limb ischemia  S/p thrombectomy (3/24) for right brachial artery occlusion  Now Right hand pain, cool to touch, dry eschar wound on the palmar region, decreased dopplers per RN, and tachycardia, tachypnea with leukocytosis  UE arterial duplex (4/3) - right ulnar artery occlusion.   VVS Dr. Chestine Spore - No surgical intervention at this time.  Recommends medical management with Heparin infusion.    Discussed with Dr. Yetta Barre with Neurosurgery ok'd the initiation of Heparin  Heparin infusion started on 4/3  Percocet 5/325 q 6hr prn for right hand pain  Hyperlipidemia  Home meds:none  LDL 99, goal < 70  On lipitor 40 now  Continue statin at discharge  Poorly controlled Diabetes type II, with hyperglycemia   Home meds:Metformin, Glargine, Aspart  HgbA1c 10.6, goal < 7.0  CBGs  SSI  Levemir to 35 units bid  Novolog 6 units every 4 hours  Cocaine abuse  UDS positive for cocaine  Cessation education will be provided  Tobacco abuse  Current smoker  Smoking cessation counselingwill beprovided  Other Stroke Risk Factors  Substance abuse - UDS: THC POSITIVEWill advise patientto stop using due to stroke risk.  Other Active Problems  Sick thyroid?- TSH: 0.307 (Low) Free T4:1.27 (high)    Hospital day # 13    Marvel Plan, MD PhD Stroke Neurology 07/17/2020 7:40 PM    To contact Stroke Continuity provider, please refer to [email or web address]. After hours, contact General Neurology

## 2020-07-17 NOTE — Progress Notes (Addendum)
Subjective: Patient reports responds "Brandi Myers" when asked her name  Objective: Vital signs in last 24 hours: Temp:  [97.4 F (36.3 C)-99.6 F (37.6 C)] 98.4 F (36.9 C) (04/05 0629) Pulse Rate:  [106-134] 109 (04/05 0629) Resp:  [18-28] 22 (04/05 0629) BP: (106-164)/(71-105) 136/86 (04/05 0629) SpO2:  [91 %-97 %] 94 % (04/05 0337)  Intake/Output from previous day: 04/04 0701 - 04/05 0700 In: 517.4 [I.V.:67.4; NG/GT:450] Out: -  Intake/Output this shift: No intake/output data recorded.  Awakens to gentle touch, responds promptly to commands, moving right arm and right leg. No movement left side. Sleepy, opening eyes and following commands only briefly before drifting off. Scalp and abdominal incisions healing well without erythema, swelling or drainage. Staples present scalp.  Lab Results: Recent Labs    07/16/20 0654 07/17/20 0004  WBC 18.6* 21.5*  HGB 9.7* 9.6*  HCT 35.8* 35.5*  PLT 608* 626*   BMET Recent Labs    07/16/20 0654 07/17/20 0004  NA 139 138  K 3.9 4.0  CL 104 101  CO2 26 24  GLUCOSE 127* 155*  BUN 12 11  CREATININE 0.62 0.57  CALCIUM 9.2 9.3    Studies/Results: DG CHEST PORT 1 VIEW  Result Date: 07/16/2020 CLINICAL DATA:  Eval for aspiration EXAM: PORTABLE CHEST 1 VIEW COMPARISON:  July 13, 2020 FINDINGS: The heart size and mediastinal contours are within normal limits. Slight interval improvement in the left perihilar airspace opacities. The right lung is clear. No pleural effusion. Enteric tube is seen below the diaphragm. IMPRESSION: Slight interval improvement in the left perihilar airspace opacities Electronically Signed   By: Jonna Clark M.D.   On: 07/16/2020 18:45   VAS [place] UPPER EXTREMITY ARTERIAL DUPLEX  Result Date: 07/15/2020 UPPER EXTREMITY DUPLEX STUDY Indications: Patient complains of Cool, purple fingers, wound on right palm,              recent right upper extremity thrombectomy. History:     Patient has a history of upper extremity  pain.  Limitations: Patient confusion, constant movement of right arm. Comparison Study: Prior study done 07/05/20 Performing Technologist: Sherren Kerns RVS  Examination Guidelines: A complete evaluation includes B-mode imaging, spectral Doppler, color Doppler, and power Doppler as needed of all accessible portions of each vessel. Bilateral testing is considered an integral part of a complete examination. Limited examinations for reoccurring indications may be performed as noted.  Right Doppler Findings: +---------------+----------+-------------------+--------+------------------+ Site           PSV (cm/s)Waveform           StenosisComments           +---------------+----------+-------------------+--------+------------------+ Subclavian Prox52        triphasic                                     +---------------+----------+-------------------+--------+------------------+ Subclavian Mid -49       triphasic                                     +---------------+----------+-------------------+--------+------------------+ Subclavian Dist          triphasic                                     +---------------+----------+-------------------+--------+------------------+ Axillary  48        triphasic                                     +---------------+----------+-------------------+--------+------------------+ Brachial Prox  37        triphasic                                     +---------------+----------+-------------------+--------+------------------+ Brachial Mid   31        triphasic                                     +---------------+----------+-------------------+--------+------------------+ Brachial Dist  -29       triphasic                                     +---------------+----------+-------------------+--------+------------------+ Radial Mid     60        triphasic                                      +---------------+----------+-------------------+--------+------------------+ Radial Dist    53        triphasic                                     +---------------+----------+-------------------+--------+------------------+ Ulnar Mid                dampened monophasic        partially occluded +---------------+----------+-------------------+--------+------------------+ Palmar Arch              dampened monophasic        minimal flow noted +---------------+----------+-------------------+--------+------------------+    Summary:  Right: Right ulnar artery appears occluded. *See table(s) above for measurements and observations. Electronically signed by Sherald Hess MD on 07/15/2020 at 4:21:03 PM.    Final    VAS [place] LOWER EXTREMITY VENOUS (DVT)  Result Date: 07/15/2020  Lower Venous DVT Study Indications: Tachycardia, tachypnea. Stroke.  Comparison Study: Prior negative BLEV done 07/05/20 Performing Technologist: Sherren Kerns RVS  Examination Guidelines: A complete evaluation includes B-mode imaging, spectral Doppler, color Doppler, and power Doppler as needed of all accessible portions of each vessel. Bilateral testing is considered an integral part of a complete examination. Limited examinations for reoccurring indications may be performed as noted. The reflux portion of the exam is performed with the patient in reverse Trendelenburg.  +---------+---------------+---------+-----------+----------+--------------+ RIGHT    CompressibilityPhasicitySpontaneityPropertiesThrombus Aging +---------+---------------+---------+-----------+----------+--------------+ CFV      Full           Yes      Yes                                 +---------+---------------+---------+-----------+----------+--------------+ SFJ      Full                                                        +---------+---------------+---------+-----------+----------+--------------+  FV Prox  Full                                                         +---------+---------------+---------+-----------+----------+--------------+ FV Mid   Full                                                        +---------+---------------+---------+-----------+----------+--------------+ FV DistalFull                                                        +---------+---------------+---------+-----------+----------+--------------+ PFV      Full                                                        +---------+---------------+---------+-----------+----------+--------------+ POP      Full           Yes      Yes                                 +---------+---------------+---------+-----------+----------+--------------+ PTV      Full                                                        +---------+---------------+---------+-----------+----------+--------------+ PERO     Full                                                        +---------+---------------+---------+-----------+----------+--------------+   +---------+---------------+---------+-----------+----------+--------------+ LEFT     CompressibilityPhasicitySpontaneityPropertiesThrombus Aging +---------+---------------+---------+-----------+----------+--------------+ CFV      Full           Yes      Yes                                 +---------+---------------+---------+-----------+----------+--------------+ SFJ      Full                                                        +---------+---------------+---------+-----------+----------+--------------+ FV Prox  Full                                                        +---------+---------------+---------+-----------+----------+--------------+  FV Mid   Full                                                        +---------+---------------+---------+-----------+----------+--------------+ FV DistalFull                                                         +---------+---------------+---------+-----------+----------+--------------+ PFV      Full                                                        +---------+---------------+---------+-----------+----------+--------------+ POP      Full           Yes      Yes                                 +---------+---------------+---------+-----------+----------+--------------+ PTV      Full                                                        +---------+---------------+---------+-----------+----------+--------------+ PERO     Full                                                        +---------+---------------+---------+-----------+----------+--------------+     Summary: BILATERAL: - No evidence of deep vein thrombosis seen in the lower extremities, bilaterally. -   *See table(s) above for measurements and observations. Electronically signed by Sherald Hess MD on 07/15/2020 at 4:20:07 PM.    Final     Assessment/Plan: improving  LOS: 13 days  Supportive care continues. Hopeful for CIR.   Georgiann Cocker 07/17/2020, 7:35 AM  Patient is improving.  Ready for Rehab.

## 2020-07-17 NOTE — Progress Notes (Signed)
Tolerating heparin.  Right hand looks stable with this dry eschar on the palmar surface that was present at the time of my initial evaluation.  Has undergone right upper extremity thrombectomy.  She does have triphasic flow down to the wrist through the radial artery on duplex.  I suspect she has embolized into her hand.  Continue heparin from my standpoint and ultimately will need transition to likely anticoagulation at discharge assuming no other issues.  May ultimately benefit from arteriogram in the future.  Cephus Shelling, MD [facility] Office: [phone]   Cephus Shelling

## 2020-07-17 NOTE — Progress Notes (Signed)
ANTICOAGULATION CONSULT NOTE - Follow Up Consult  Pharmacy Consult for Heparin Indication: RUE brachial artery occlusion   Labs: Recent Labs    07/15/20 0409 07/15/20 2059 07/16/20 0654 07/16/20 1541 07/17/20 0004 07/17/20 0659 07/17/20 1201 07/17/20 2030  HGB 9.2*  --  9.7*  --  9.6*  --   --   --   HCT 33.6*  --  35.8*  --  35.5*  --   --   --   PLT 559*  --  608*  --  626*  --   --   --   HEPARINUNFRC  --    < > <0.10*   < > <0.10* 0.11* <0.10* 0.11*  CREATININE 0.47  --  0.62  --  0.57  --   --   --    < > = values in this interval not displayed.    Assessment: 56 YOF who presented on 3/23 with an acute ischemic stroke s/p tPA 3/23 with hemorrhagic transformation requiring decompressive craniotomy by neurosurgery 3/25. 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 Ischemia noted 4/3 and VVS reconsulted - suspect distal embolization, no further interventions, and to consider Heparin. Neuro and neurosurgery have okayed start of heparin, low goal and no bolus. Head CT appears stable at this time.    Heparin level <0.1 units/ml (level was drawn at 1201, RN states heparin bag ran out at ~1230, but was definitely still running when lab was taken. Restarted infusion at 1420) RN reports no problems with the infusion, line, or site. CBC was stable this morning.  Heparin level this evening still low at 0.11, patient is receiving considerable amounts of IV heparin ~24 units/kg currently and still not very close to therapeutic levels. Hgb stable 9s. No infiltration or issues with infusion per nursing.   Goal of Therapy:  Heparin level 0.3-0.5 units/ml Monitor platelets by anticoagulation protocol: Yes   Plan:  - Increase Heparin to 1950 units/hr  - No bolus and aimin for low goal 0.3-0.5 - Will continue to monitor for any signs/symptoms of bleeding and will follow up with heparin level in 6 hours   Sheppard Coil PharmD.,  BCPS Clinical Pharmacist 07/17/2020 10:06 PM  Please check [email or web address] for unit-specific pharmacy phone numbers.

## 2020-07-17 NOTE — Progress Notes (Signed)
ANTICOAGULATION CONSULT NOTE - Follow Up Consult  Pharmacy Consult for Heparin Indication: RUE brachial artery occlusion   Labs: Recent Labs    07/15/20 0409 07/15/20 2059 07/16/20 0654 07/16/20 1541 07/17/20 0004 07/17/20 0659 07/17/20 1201  HGB 9.2*  --  9.7*  --  9.6*  --   --   HCT 33.6*  --  35.8*  --  35.5*  --   --   PLT 559*  --  608*  --  626*  --   --   HEPARINUNFRC  --    < > <0.10*   < > <0.10* 0.11* <0.10*  CREATININE 0.47  --  0.62  --  0.57  --   --    < > = values in this interval not displayed.    Assessment: Brandi Myers who presented on 3/23 with an acute ischemic stroke s/p tPA 3/23 with hemorrhagic transformation requiring decompressive craniotomy by neurosurgery 3/25. 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 Ischemia noted 4/3 and VVS reconsulted - suspect distal embolization, no further interventions, and to consider Heparin. Neuro and neurosurgery have okayed start of heparin, low goal and no bolus. Head CT appears stable at this time.    Heparin level <0.1 units/ml (level was drawn at 1201, RN states heparin bag ran out at ~1230, but was definitely still running when lab was taken. Restarted infusion at 1420) RN reports no problems with the infusion, line, or site. CBC was stable this morning.  Goal of Therapy:  Heparin level 0.3-0.5 units/ml Monitor platelets by anticoagulation protocol: Yes   Plan:  - Increase Heparin to 1700 units/hr  - No bolus and will aim for low goal 0.3-0.5 - Will continue to monitor for any signs/symptoms of bleeding and will follow up with heparin level in 6 hours     Thank you for allowing [place] to participate in this patients care. Signe Colt, PharmD 07/17/2020 3:01 PM  Please check [email or web address] for unit-specific pharmacy phone numbers.

## 2020-07-17 NOTE — Progress Notes (Signed)
ANTICOAGULATION CONSULT NOTE - Follow Up Consult  Pharmacy Consult for Heparin Indication: RUE brachial artery occlusion   Labs: Recent Labs    07/15/20 0409 07/15/20 2059 07/16/20 0654 07/16/20 1541 07/17/20 0004  HGB 9.2*  --  9.7*  --  9.6*  HCT 33.6*  --  35.8*  --  35.5*  PLT 559*  --  608*  --  626*  HEPARINUNFRC  --    < > <0.10* <0.10* <0.10*  CREATININE 0.47  --  0.62  --  0.57   < > = values in this interval not displayed.    Assessment: 30 YOF who presented on 3/23 with an acute ischemic stroke s/p tPA 3/23 with hemorrhagic transformation requiring decompressive craniotomy by neurosurgery 3/25. 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 Ischemia noted 4/3 and VVS reconsulted - suspect distal embolization, no further interventions, and to consider Heparin. Neuro and neurosurgery have okayed start of heparin, low goal and no bolus. Head CT appears stable at this time.   Heparin level <0.1 units/ml  Goal of Therapy:  Heparin level 0.3-0.5 units/ml Monitor platelets by anticoagulation protocol: Yes   Plan:  - Increase Heparin to 1500 units/hr  - No bolus and will aim for low goal 0.3-0.5 - Will continue to monitor for any signs/symptoms of bleeding and will follow up with heparin level in 6 hours   Thank you for allowing pharmacy to be a part of this patient's care. Talbert Cage, PharmD

## 2020-07-17 NOTE — Progress Notes (Signed)
Occupational Therapy Treatment Patient Details Name: Brandi Myers MRN: [id number] DOB: March 22, 1986 Today's Date: 07/17/2020    History of present illness 35 yo female admitted with L sided weakness givent TPA for R MCA involving basal ganglia frontal temporal lobes  3/23 s/p BIL revascularization  R MCA ACA achieving 2B revascularization PT intubated starting 3/23 . 3/24 R UE thromectomy CT reveals worsening midline shift start 3% saline 3/25 decompressive hemicranitectomy R side bone flap in abdomen 3/25 fever 103. Extubated 3/31. PMH current smoker uncontrolled DM HTn   OT comments  Pt. Was a co-tx working on bed mobility and sitting balance. Pt. Worked on grooming and ue dressing while sitting eob. Pt. Is not using r ue on command. Pt. Is keeping r wrist in flexion and is not wanting to use to hold onto bedrail to hold position. Pt. Sat EOB with total assist for PROM neuro re-education of L UE. Pt. Stood times 2 attempts total A of 2. Pt. Was able to assist with maintaining standing at Max A of 2. Continue to work with OT.   Follow Up Recommendations  CIR    Equipment Recommendations       Recommendations for Other Services      Precautions / Restrictions Precautions Precautions: Fall Precaution Comments: bone flap out in R abdomen, L hemiparesis, rectal tube, NG tube Restrictions Weight Bearing Restrictions: No       Mobility Bed Mobility Overal bed mobility: Needs Assistance Bed Mobility: Supine to Sit     Supine to sit: +2 for physical assistance;Total assist Sit to supine: Total assist;+2 for physical assistance   General bed mobility comments: Pt was Total assist to maintain sitting balance.    Transfers       Sit to Stand: Total assist;+2 physical assistance         General transfer comment: Pt. was able to stand wtih 2 person assist for 2 attempts.    Balance     Sitting balance-Leahy Scale: Poor                                      ADL either performed or assessed with clinical judgement   ADL   Eating/Feeding: NPO   Grooming: Wash/dry face;Total assistance   Upper Body Bathing: Total assistance;Bed level       Upper Body Dressing : Total assistance;Sitting   Lower Body Dressing: Total assistance;Bed level               Functional mobility during ADLs: Total assistance;+2 for physical assistance;+2 for safety/equipment General ADL Comments: Total A with pt. requiring hand over hand assist to use R ue for grooming and dressing tasks.     Vision       Perception     Praxis      Cognition Arousal/Alertness: Lethargic Behavior During Therapy: Flat affect Overall Cognitive Status: Difficult to assess                                 General Comments: Pt. was nodding and shaking head to questions.        Exercises     Shoulder Instructions       General Comments      Pertinent Vitals/ Pain       Pain Assessment: Faces Faces Pain Scale: Hurts a little bit Pain Location:  r wrist with movement Pain Intervention(s): Monitored during session  Home Living                                          Prior Functioning/Environment              Frequency           Progress Toward Goals  OT Goals(current goals can now be found in the care plan section)  Progress towards OT goals: Progressing toward goals  Acute Rehab OT Goals Patient Stated Goal: none stated OT Goal Formulation: Patient unable to participate in goal setting Potential to Achieve Goals: Good  Plan Discharge plan remains appropriate    Co-evaluation                 AM-PAC OT "6 Clicks" Daily Activity     Outcome Measure   Help from another person eating meals?: Total Help from another person taking care of personal grooming?: Total Help from another person toileting, which includes using toliet, bedpan, or urinal?: Total Help from another person bathing (including  washing, rinsing, drying)?: Total Help from another person to put on and taking off regular upper body clothing?: Total Help from another person to put on and taking off regular lower body clothing?: Total 6 Click Score: 6    End of Session Equipment Utilized During Treatment: Gait belt  OT Visit Diagnosis: Unsteadiness on feet (R26.81);Other abnormalities of gait and mobility (R26.89);Muscle weakness (generalized) (M62.81);Hemiplegia and hemiparesis   Activity Tolerance Patient tolerated treatment well   Patient Left in bed;with call bell/phone within reach;with bed alarm set   Nurse Communication  (ok therapy)        Time: 8115-7262 OT Time Calculation (min): 30 min  Charges: OT General Charges $OT Visit: 1 Visit OT Treatments $Self Care/Home Management : 8-22 mins  Brandi Myers OT/L   Brandi Myers 07/17/2020, 12:10 PM

## 2020-07-18 ENCOUNTER — Inpatient Hospital Stay (HOSPITAL_COMMUNITY): Payer: Medicaid Other

## 2020-07-18 DIAGNOSIS — D72829 Elevated white blood cell count, unspecified: Secondary | ICD-10-CM | POA: Diagnosis not present

## 2020-07-18 DIAGNOSIS — I6601 Occlusion and stenosis of right middle cerebral artery: Secondary | ICD-10-CM | POA: Diagnosis not present

## 2020-07-18 DIAGNOSIS — R000 Tachycardia, unspecified: Secondary | ICD-10-CM

## 2020-07-18 DIAGNOSIS — G936 Cerebral edema: Secondary | ICD-10-CM | POA: Diagnosis not present

## 2020-07-18 LAB — CBC
HCT: 36.2 % (ref 36.0–46.0)
Hemoglobin: 10 g/dL — ABNORMAL LOW (ref 12.0–15.0)
MCH: 21.6 pg — ABNORMAL LOW (ref 26.0–34.0)
MCHC: 27.6 g/dL — ABNORMAL LOW (ref 30.0–36.0)
MCV: 78 fL — ABNORMAL LOW (ref 80.0–100.0)
Platelets: 739 10*3/uL — ABNORMAL HIGH (ref 150–400)
RBC: 4.64 MIL/uL (ref 3.87–5.11)
RDW: 27.7 % — ABNORMAL HIGH (ref 11.5–15.5)
WBC: 19.6 10*3/uL — ABNORMAL HIGH (ref 4.0–10.5)
nRBC: 0 % (ref 0.0–0.2)

## 2020-07-18 LAB — GLUCOSE, CAPILLARY
Glucose-Capillary: 140 mg/dL — ABNORMAL HIGH (ref 70–99)
Glucose-Capillary: 163 mg/dL — ABNORMAL HIGH (ref 70–99)
Glucose-Capillary: 172 mg/dL — ABNORMAL HIGH (ref 70–99)
Glucose-Capillary: 220 mg/dL — ABNORMAL HIGH (ref 70–99)
Glucose-Capillary: 225 mg/dL — ABNORMAL HIGH (ref 70–99)
Glucose-Capillary: 269 mg/dL — ABNORMAL HIGH (ref 70–99)

## 2020-07-18 LAB — HEPARIN LEVEL (UNFRACTIONATED)
Heparin Unfractionated: 0.43 IU/mL (ref 0.30–0.70)
Heparin Unfractionated: 0.18 IU/mL — ABNORMAL LOW (ref 0.30–0.70)
Heparin Unfractionated: 0.31 IU/mL (ref 0.30–0.70)

## 2020-07-18 LAB — EXPECTORATED SPUTUM ASSESSMENT W GRAM STAIN, RFLX TO RESP C

## 2020-07-18 LAB — URINALYSIS, ROUTINE W REFLEX MICROSCOPIC
Bilirubin Urine: NEGATIVE
Glucose, UA: NEGATIVE mg/dL
Hgb urine dipstick: NEGATIVE
Ketones, ur: 80 mg/dL — AB
Leukocytes,Ua: NEGATIVE
Nitrite: NEGATIVE
Protein, ur: NEGATIVE mg/dL
Specific Gravity, Urine: 1.025 (ref 1.005–1.030)
pH: 5 (ref 5.0–8.0)

## 2020-07-18 LAB — BASIC METABOLIC PANEL
Anion gap: 12 (ref 5–15)
BUN: 10 mg/dL (ref 6–20)
CO2: 25 mmol/L (ref 22–32)
Calcium: 9.5 mg/dL (ref 8.9–10.3)
Chloride: 100 mmol/L (ref 98–111)
Creatinine, Ser: 0.66 mg/dL (ref 0.44–1.00)
GFR, Estimated: >60 mL/min (ref >60)
Glucose, Bld: 140 mg/dL — ABNORMAL HIGH (ref 70–99)
Potassium: 4.1 mmol/L (ref 3.5–5.1)
Sodium: 137 mmol/L (ref 135–145)

## 2020-07-18 MED ORDER — SODIUM CHLORIDE 0.9 % IV SOLN: INTRAVENOUS | Status: DC

## 2020-07-18 NOTE — Progress Notes (Signed)
Inpatient Rehabilitation Admissions Coordinator  I left a voicemail for patient's father to contact me to discuss rehab venue options for patient. I await call back.  Ottie Glazier, RN, MSN Rehab Admissions Coordinator [phone] 07/18/2020 5:22 PM

## 2020-07-18 NOTE — Consult Note (Addendum)
[facility]       PATIENT DETAILS Name: Brandi Myers Age: 35 y.o. Sex: female Date of Birth: 1985-06-08 Admit Date: 07/04/2020 OIZ:[id number], [facility] Requesting MD: Rosalin Hawking, MD   Date of consultation: 4/6  REASON FOR CONSULTATION:  Tachycardia/leukocytosis  HISTORY OF PRESENT ILLNESS: Brandi Myers is an 35 y.o. 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  Patient was given TPA-subsequently evaluated by IR-and underwent endovascular revascularization of the occluded right ICA, MCA and right ACA on 3/24.  Patient remains intubated post revascularization and remains in the ICU.Subsequently-patient developed hemorrhagic transformation with significant amount of cerebral cytotoxic edema requiring decompressive hemicraniectomy, she was also found to have acute right upper extremity ischemia due to occluded brachial artery-and underwent right upper extremity thrombectomy.  While in the [facility] did have fever that was attributed to MSSA/strep pneumonia and completed a 7-day course of ceftriaxone on 4/2.  Patient was subsequently stabilized-and transferred to the progressive care unit-over the past few days-she has developed persistent leukocytosis and tachycardia-hospitalist service was then consulted for further evaluation and recommendations.  SIGNIFICANT EVENTS 3/23>> cerebral angiogram-endovascular revascularization of occluded right ICA supraclinoid segment, right MCA and right ACA 3/24>> right upper extremity thrombectomy including the right radial/ulnar and brachial artery 3/25>> right craniectomy with placement of bone flap in the abdomen 3/23-3/31>> ETT   ASSESSMENT AND PLAN Tachycardia/leukocytosis: Suspect that this is due to ischemic  right hand-she still has pain-and has a eschar in the thenar eminence of the right hand.  Chest x-ray done earlier this morning without pneumonia pneumonia.  No history of diarrhea-Per nursing staff-she does not have any sacral decubitus.  Given her nontoxic appearance-suspect continued observation without the use of antimicrobial therapy is appropriate.  However prudent to repeat blood cultures-she is scheduled to get a TEE tomorrow to rule out endocarditis (UDS positive for cocaine)/cardiac source of emboli-given embolic events to the brain and right upper extremity.  Hospitalist service will continue to follow.  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  Management per neurology and neurosurgery.  Acute right upper extremity ischemia: Vascular surgery following-maintain on IV heparin.  Dysphagia: Due to CVA-NG tube feedings ongoing.  MSSA/strep pneumonia: Completed course of antimicrobial therapy.  HLD: On statin  DM-2 (A1c 10.6 on 3/23): You stable on Lantus 35 units twice daily, 6 units of NovoLog every 4 hours and SSI.  Recent Labs    07/18/20 0347 07/18/20 0736 07/18/20 1126  GLUCAP 140* 163* 220*   Cocaine abuse-UDS positive for cocaine on admission   REVIEW OF SYSTEMS:  Constitutional:   No  weight loss, night sweats,  Fevers, chills, fatigue.  HEENT:    No headaches, Difficulty swallowing,Tooth/dental problems,Sore throat,   Cardio-vascular: No chest pain,  Orthopnea, PND, swelling in lower extremities, anasarca,   dizziness, palpitations  GI:  No heartburn, indigestion, abdominal pain, nausea, vomiting, diarrhea  Resp: No shortness of breath with exertion or at rest.  No excess mucus, no productive cough, No non-productive cough,  No coughing up of blood.No change in color of mucus.No wheezing.No chest wall  deformity  Skin:  no rash or lesions.  GU:  no dysuria, change in color of urine, no urgency or frequency.  No flank pain.  Musculoskeletal: No joint  pain or swelling.  No decreased range of motion.  No back pain.  Psych: No change in mood or affect. No depression or anxiety.  No memory loss.   ALLERGIES:   Allergies  Allergen Reactions  . Norco [Hydrocodone-Acetaminophen] Itching, Nausea And Vomiting and Swelling    PAST MEDICAL HISTORY: Past Medical History:  Diagnosis Date  . Diabetes mellitus without complication ([place])   . Hypertension     PAST SURGICAL HISTORY: Past Surgical History:  Procedure Laterality Date  . CESAREAN SECTION    . CHOLECYSTECTOMY    . CRANIOTOMY Right 07/06/2020   Procedure: RIGHT CRANIECTOMY WITH PLACEMENT OF BONE FLAP IN ABDOMEN;  Surgeon: Erline Levine, MD;  Location: [facility];  Service: Neurosurgery;  Laterality: Right;  . IR PERCUTANEOUS ART THROMBECTOMY/INFUSION INTRACRANIAL INC DIAG ANGIO  07/04/2020  . RADIOLOGY WITH ANESTHESIA N/A 07/04/2020   Procedure: IR WITH ANESTHESIA;  Surgeon: Radiologist, Medication, MD;  Location: [facility];  Service: Radiology;  Laterality: N/A;  . THROMBECTOMY BRACHIAL ARTERY Right 07/05/2020   Procedure: RIGHT UPPER EXTREMITY THROMBECTOMY;  Surgeon: Marty Heck, MD;  Location[facility];  Service: Vascular;  Laterality: Right;    MEDICATIONS AT HOME: Prior to Admission medications   Not on File    FAMILY HISTORY: Family History  Problem Relation Age of Onset  . Diabetes Maternal Grandmother      SOCIAL HISTORY:  reports that she has been smoking cigarettes. She has been smoking about 0.10 packs per day. She has never used smokeless tobacco. She reports current alcohol use. She reports that she does not use drugs.  PHYSICAL EXAM: Blood pressure (!) 125/95, pulse (!) 127, temperature 99.4 F (37.4 C), temperature source Axillary, resp. rate 20, height _0  (1.651 m), weight 70.5 kg, SpO2 99  %.  General appearance: Dysarthric-left-sided neglect.  Not in any distress.  NG tube in place. Chest: Bilaterally clear to auscultation CVS: S1-S2 regular-tachycardic. Abdomen: Soft-no RUQ tenderness.  No peritoneal signs Extremity: No edema Neurology: Left hemineglect-left hemiparesis  Data reviewed:   I have personally reviewed following labs and imaging studies  LABORATORY DATA: CBC: Recent Labs  Lab 07/14/20 0558 07/15/20 0409 07/16/20 0654 07/17/20 0004 07/18/20 0428  WBC 15.7* 16.8* 18.6* 21.5* 19.6*  HGB 9.0* 9.2* 9.7* 9.6* 10.0*  HCT 32.7* 33.6* 35.8* 35.5* 36.2  MCV 76.4* 76.9* 77.5* 78.7* 78.0*  PLT 495* 559* 608* 626* 739*    Basic Metabolic Panel: Recent Labs  Lab 07/14/20 0558 07/15/20 0409 07/16/20 0654 07/17/20 0004 07/18/20 0428  NA 139 140 139 138 137  K 3.8 3.8 3.9 4.0 4.1  CL 105 105 104 101 100  CO2 _1 GLUCOSE 187* 134* 127* 155* 140*  BUN _2 CREATININE 0.57 0.47 0.62 0.57 0.66  CALCIUM 9.0 9.2 9.2 9.3 9.5   GFR Estimated Creatinine Clearance: 96.7 mL/min (by C-G formula based on SCr of 0.66 mg/dL).  Liver Function Tests: No results for input(s): AST, ALT, ALKPHOS, BILITOT, PROT, ALBUMIN in the last 168 hours. No results for input(s): LIPASE, AMYLASE in the last 168 hours. No results for input(s): AMMONIA in the last 168 hours.  Coagulation profile No results for input(s): INR, PROTIME in the last 168 hours.  Cardiac Enzymes: No results for input(s): CKTOTAL, CKMB, CKMBINDEX, TROPONINI in the last 168 hours.  BNP: Invalid input(s): POCBNP  CBG: Recent Labs  Lab 07/17/20 1945 07/17/20 2329 07/18/20 0347 07/18/20 0736 07/18/20 1126  GLUCAP 109* 213*  140* 163* 220*    D-Dimer No results for input(s): DDIMER in the last 72 hours.  Hgb A1c No results for input(s): HGBA1C in the last 72 hours.  Lipid Profile No results for input(s): CHOL, HDL, LDLCALC, TRIG, CHOLHDL, LDLDIRECT in the last 72  hours.  Thyroid function studies No results for input(s): TSH, T4TOTAL, T3FREE, THYROIDAB in the last 72 hours.  Invalid input(s): FREET3  Anemia work up No results for input(s): VITAMINB12, FOLATE, FERRITIN, TIBC, IRON, RETICCTPCT in the last 72 hours.  Urinalysis    Component Value Date/Time   COLORURINE YELLOW 07/15/2020 1030   APPEARANCEUR CLOUDY (A) 07/15/2020 1030   LABSPEC 1.025 07/15/2020 1030   PHURINE 6.0 07/15/2020 1030   GLUCOSEU NEGATIVE 07/15/2020 1030   HGBUR NEGATIVE 07/15/2020 1030   BILIRUBINUR NEGATIVE 07/15/2020 1030   KETONESUR NEGATIVE 07/15/2020 1030   PROTEINUR NEGATIVE 07/15/2020 1030   NITRITE NEGATIVE 07/15/2020 1030   LEUKOCYTESUR NEGATIVE 07/15/2020 1030    Sepsis Labs Lactic Acid, Venous    Component Value Date/Time   LATICACIDVEN 1.1 07/16/2020 0904    Microbiology Recent Results (from the past 240 hour(s))  Expectorated Sputum Assessment w Gram Stain, Rflx to Resp Cult     Status: None (Preliminary result)   Collection Time: 07/17/20 10:13 PM   Specimen: Expectorated Sputum  Result Value Ref Range Status   Specimen Description EXPECTORATED SPUTUM  Final   Special Requests NONE  Final   Sputum evaluation   Final    Sputum specimen not acceptable for testing.  Please recollect.   RESULT CALLED TO, READ BACK BY AND VERIFIED WITH: Belinda Fisher RN 07/18/20 0449 JDW Performed at [facility] [address], [place], [place] [address]    Report Status PENDING  Incomplete     Radiological Exams on Admission: DG CHEST PORT 1 VIEW  Result Date: 07/18/2020 CLINICAL DATA:  Feeding tube dysfunction. EXAM: PORTABLE CHEST 1 VIEW COMPARISON:  07/16/2020 FINDINGS: Single view of the chest demonstrates a feeding tube that extends into the abdomen. The tube extends beyond the image. Both lungs are clear. Heart size is normal. Negative for a pneumothorax. IMPRESSION: 1. No acute chest abnormality. 2. Feeding tube extends into the abdomen but the tip  is beyond the image. Electronically Signed   By: Markus Daft M.D.   On: 07/18/2020 10:52   DG CHEST PORT 1 VIEW  Result Date: 07/16/2020 CLINICAL DATA:  Eval for aspiration EXAM: PORTABLE CHEST 1 VIEW COMPARISON:  July 13, 2020 FINDINGS: The heart size and mediastinal contours are within normal limits. Slight interval improvement in the left perihilar airspace opacities. The right lung is clear. No pleural effusion. Enteric tube is seen below the diaphragm. IMPRESSION: Slight interval improvement in the left perihilar airspace opacities Electronically Signed   By: Prudencio Pair M.D.   On: 07/16/2020 18:45   DG Abd Portable 1V  Result Date: 07/18/2020 CLINICAL DATA:  Feeding tube dysfunction, nurse reports choking EXAM: PORTABLE ABDOMEN - 1 VIEW COMPARISON:  None. FINDINGS: Two partial images of the abdomen are submitted for review, one of two demonstrating non weighted enteric feeding tube tip projecting over the gastric fundus. A substantial portion of the right hemiabdomen is excluded from images submitted for review. Nonobstructive pattern of bowel gas. IMPRESSION: Two partial images of the abdomen are submitted for review, one of two demonstrating non weighted enteric feeding tube tip projecting over the gastric fundus. Electronically Signed   By: Eddie Candle M.D.   On:  07/18/2020 10:52    Inpatient Medications:   Anti-infectives (From admission, onward)   Start     Dose/Rate Route Frequency Ordered Stop   07/08/20 0900  cefTRIAXone (ROCEPHIN) 2 g in sodium chloride 0.9 % 100 mL IVPB        2 g 200 mL/hr over 30 Minutes Intravenous Every 24 hours 07/08/20 0801 07/14/20 1132   07/08/20 0900  vancomycin (VANCOCIN) 1,750 mg in sodium chloride 0.9 % 500 mL IVPB        1,750 mg 250 mL/hr over 120 Minutes Intravenous  Once 07/08/20 0802 07/08/20 1149   07/06/20 2000  ceFAZolin (ANCEF) IVPB 1 g/50 mL premix        1 g 100 mL/hr over 30 Minutes Intravenous Every 8 hours 07/06/20 1400 07/07/20 0508    07/06/20 0945  ceFAZolin (ANCEF) IVPB 2g/100 mL premix        2 g 200 mL/hr over 30 Minutes Intravenous To Surgery 07/06/20 0855 07/07/20 0945   07/04/20 1541  ceFAZolin (ANCEF) 2-4 GM/100ML-% IVPB       Note to Pharmacy: Desiree Hane   : cabinet override      07/04/20 1541 07/05/20 0344     Scheduled Meds: . sodium chloride   Intravenous Once  . atorvastatin  40 mg Per Tube q1800  . chlorhexidine gluconate (MEDLINE KIT)  15 mL Mouth Rinse BID  . Chlorhexidine Gluconate Cloth  6 each Topical Daily  . docusate  100 mg Per Tube BID  . gabapentin  600 mg Per Tube BID  . insulin aspart  0-15 Units Subcutaneous Q4H  . insulin aspart  6 Units Subcutaneous Q4H  . insulin detemir  35 Units Subcutaneous BID  . levETIRAcetam  500 mg Per Tube BID  . mouth rinse  15 mL Mouth Rinse q12n4p  . multivitamin with minerals  1 tablet Per Tube Daily  . polyethylene glycol  17 g Per Tube Daily  . sodium chloride flush  10-40 mL Intracatheter Q12H   Continuous Infusions: . sodium chloride Stopped (07/13/20 1028)  . feeding supplement (GLUCERNA 1.5 CAL) Stopped (07/16/20 1807)  . heparin 2,100 Units/hr (07/18/20 1321)     Impression/Recommendations: Active Problems:   Stroke (cerebrum) (HCC)   Middle cerebral artery embolism, right    [facility] will followup again tomorrow. Please contact me if I can be of assistance in the meanwhile. Thank you for this consultation.  Total time spent 45 minutes.  Delorse Shane [facility] Pager [phone]  If 7PM-7AM, please contact night-coverage  Please page via [email or web address]  Go to [email or web address] and use [facility]'s universal password to access. If you do not have the password, please contact the hospital operator.  Locate the [facility] provider you are looking for under [facility] and page to a number that you can be directly reached. If you still have difficulty reaching the provider, please page the [facility] (Director on Call) for  the Hospitalists listed on amion for assistance.  07/18/2020, 2:39 PM

## 2020-07-18 NOTE — Progress Notes (Signed)
ANTICOAGULATION CONSULT NOTE - Follow Up Consult  Pharmacy Consult for Heparin Indication: RUE exclusion  Allergies  Allergen Reactions  . Norco [Hydrocodone-Acetaminophen] Itching, Nausea And Vomiting and Swelling    Patient Measurements: Height: 5\' 5"  (165.1 cm) Weight: 70.5 kg (155 lb 6.8 oz) IBW/kg (Calculated) : 57  Vital Signs: Temp: 98 F (36.7 C) (04/06 1500) Temp Source: Axillary (04/06 1500) BP: 124/87 (04/06 1500) Pulse Rate: 116 (04/06 1500)  Labs: Recent Labs    07/16/20 0654 07/16/20 1541 07/17/20 0004 07/17/20 0659 07/18/20 0428 07/18/20 0941 07/18/20 1842  HGB 9.7*  --  9.6*  --  10.0*  --   --   HCT 35.8*  --  35.5*  --  36.2  --   --   PLT 608*  --  626*  --  739*  --   --   HEPARINUNFRC <0.10*   < > <0.10*   < > 0.43 0.18* 0.31  CREATININE 0.62  --  0.57  --  0.66  --   --    < > = values in this interval not displayed.    Estimated Creatinine Clearance: 96.7 mL/min (by C-G formula based on SCr of 0.66 mg/dL).  Assessment:  Anticoag: RUE occlusion noted in radial, ulnar, and distal brachial artery - s/p thrombectomy 3/24 with Vasc Surg. To start low-goal Hep no bolus 4/3 d/t concern for distal embolization w/ no further plans for intervention per VVS. Head CT stable. Hep level 0.31 now in goal.  Goal of Therapy:  Hep level 0.3-0.5 Monitor platelets by anticoagulation protocol: Yes   Plan:  Con't heparin 2100 units/hr Recheck HL and CBc in AM.   Lenis Nettleton S. 6/3, PharmD, BCPS Clinical Staff Pharmacist [email or web address] Merilynn Finland Stillinger 07/18/2020,8:04 PM

## 2020-07-18 NOTE — Progress Notes (Addendum)
Subjective: Patient reports "Just my right arm(hurts)"  Objective: Vital signs in last 24 hours: Temp:  [99.1 F (37.3 C)-99.8 F (37.7 C)] 99.8 F (37.7 C) (04/06 0346) Pulse Rate:  [98-115] 103 (04/06 0346) Resp:  [18-25] 20 (04/06 0346) BP: (116-140)/(70-96) 127/81 (04/06 0346) SpO2:  [95 %-99 %] 97 % (04/06 0346)  Intake/Output from previous day: 04/05 0701 - 04/06 0700 In: 639 [I.V.:579; NG/GT:60] Out: 300 [Urine:300] Intake/Output this shift: No intake/output data recorded.  Awakens to voice. More alert this morning, following commands briskly with right side. Follows to the right of midline.  Left side without intentional movement, muscle tone resists passive movement. Scalp and right abdomen incisions healing well without erythema swelling or drainage. Pt denies discomfort at either site.   Lab Results: Recent Labs    07/17/20 0004 07/18/20 0428  WBC 21.5* 19.6*  HGB 9.6* 10.0*  HCT 35.5* 36.2  PLT 626* 739*   BMET Recent Labs    07/17/20 0004 07/18/20 0428  NA 138 137  K 4.0 4.1  CL 101 100  CO2 24 25  GLUCOSE 155* 140*  BUN 11 10  CREATININE 0.57 0.66  CALCIUM 9.3 9.5    Studies/Results: DG CHEST PORT 1 VIEW  Result Date: 07/16/2020 CLINICAL DATA:  Eval for aspiration EXAM: PORTABLE CHEST 1 VIEW COMPARISON:  July 13, 2020 FINDINGS: The heart size and mediastinal contours are within normal limits. Slight interval improvement in the left perihilar airspace opacities. The right lung is clear. No pleural effusion. Enteric tube is seen below the diaphragm. IMPRESSION: Slight interval improvement in the left perihilar airspace opacities Electronically Signed   By: Jonna Clark M.D.   On: 07/16/2020 18:45    Assessment/Plan: improving  LOS: 14 days   Plan for scalp incision staple removal on Friday. May remove honeycomb from abdominal incision anytime and cleanse with baths. Continue therapies.     Georgiann Cocker 07/18/2020, 7:16 AM  Patient is making  good progress

## 2020-07-18 NOTE — Progress Notes (Addendum)
STROKE TEAM PROGRESS NOTE   INTERVAL HISTORY Nursing stopped tube feeding overnight as they said patient had a choking like episode with coughing.   No family at bedside.    Patient lying in bed asking to call her family and to be able to get up and walk around with comprehensible speech.  No extra work of breathing.  Right hand exam stable. Continues on heparin drip.   Vitals:   07/17/20 1946 07/17/20 2330 07/18/20 0346 07/18/20 0700  BP: 119/79 116/70 127/81 129/76  Pulse: (!) 107 98 (!) 103 (!) 105  Resp: 18 20 20 20   Temp: 99.5 F (37.5 C) 99.1 F (37.3 C) 99.8 F (37.7 C) 98.3 F (36.8 C)  TempSrc: Oral Oral Oral Oral  SpO2: 99% 95% 97% 97%  Weight:      Height:       CBC:  Recent Labs  Lab 07/17/20 0004 07/18/20 0428  WBC 21.5* 19.6*  HGB 9.6* 10.0*  HCT 35.5* 36.2  MCV 78.7* 78.0*  PLT 626* 739*   Basic Metabolic Panel:  Recent Labs  Lab 07/17/20 0004 07/18/20 0428  NA 138 137  K 4.0 4.1  CL 101 100  CO2 24 25  GLUCOSE 155* 140*  BUN 11 10  CREATININE 0.57 0.66  CALCIUM 9.3 9.5   Lipid Panel:  No results for input(s): CHOL, TRIG, HDL, CHOLHDL, VLDL, LDLCALC in the last 168 hours. HgbA1c: No results for input(s): HGBA1C in the last 168 hours. Urine Drug Screen: No results for input(s): LABOPIA, COCAINSCRNUR, LABBENZ, AMPHETMU, THCU, LABBARB in the last 168 hours.  Alcohol Level No results for input(s): ETH in the last 168 hours.  IMAGING past 24 hours CT HEAD WO CONTRAST  Result Date: 07/14/2020  IMPRESSION:  1. Evolving right MCA territory infarct with slightly decreased swelling and midline shift.  2. Improved sinus inflammation. 3. No new abnormality.    PHYSICAL EXAM Blood pressure 129/76, pulse (!) 105, temperature 98.3 F (36.8 C), temperature source Oral, resp. rate 20, height 5\' 5"  (1.651 m), weight 70.5 kg, SpO2 97 %.  General: caucasian female, lying in bed asking to call her family, mildly anxious and frustrated.Right craniotomy  incision clean, dry and intact with staples.    Lungs: Symmetrical Chest rise, no labored breathing  Cardio: Regular Rate and Rhythm  Abdomen: Soft, non-tender  Skin: right thumb mild discoloration with tenderness to touch, right fingers less cool, dry eschar wound on the palmar region unchanged with tender to touch.  Neuro - awake, alert, eyes open,lethargic with psychomotor slowing,orientated to age, place, but not to time. No aphasiabut paucity of speech, moderate to sever dysarthria, followingmost of thesimple commands.Right gaze preference, not cross midline, trackingon the right,not blinking to visual threat on the left, PERRL.Left facial droop. Tongue midline.RUE and RLE spontaneous movement against gravity and LUE flaccid and LLE slight withdraw to pain.Sensation and coordination not cooperative, gait not tested.    ASSESSMENT/PLAN Brandi Myers is a 35 y.o. female with history of uncontrolled diabetesandhypertensionpresenting with left sided weakness and slurred speech.Per family there is a complicated social situation at this time. She lives intermittently in an apartment by herself but was kicked out and they went to pick her up at 5:30 AM. At that point she seemed "off"and generally quiet but did not seem to have any difficulty with left-sided weakness.Family attributed this to psychosocial issues and potential substance use noting that her sister passed away from an overdose in October. Later in the morning  she complained of diffuse body pain and was administered gabapentin and a muscle relaxer.At 11:30 AM she stood up and walkedto the showerand at1215 she was found on the bottom of the tub with slurred speech and difficulty moving the left side. She was not preactivated by EMS, but on arrival in the ED clearly appear to have a doorway examination consistent with a right MCA territory infarct.CTA revealed right ICA occlusion as well as a right common carotid  bifurcation thrombus extending into the external carotid (felt to be the source of the occlusion). CT perfusion imaging demonstrated possible core infarct of 51 mL (though notably this was obtained within 2 hours of symptom onset and therefore may reflect an overestimate of the true core). Mismatch ratio was 2 with a penumbra of 63 mL. Case was discussed with Dr. Corliss Skains of neuro interventional radiology and Dr. Otelia Limes of [facility] and the patient was accepted for transfer to [facility] for emergent intervention. She was also started on TPA after consenting the patient's emergency contact with full discussion of risks of bleeding and importance of accurately determining the patient's last known well.[name] confirm the last known well as described above and agreed that the patient would want TPA given the risk/benefit.She confirmed that the patient had no recent procedures or other contraindications to TPA.Regarding her baseline, she is functional at baseline and family believes she is smoking at least1pack a day currently. She is on Metformin for her diabetes, does not take oral contraceptive pills, has had no recent surgeries or procedures.   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   CT no acute finding but suspicious for right MCA early ischemic changes.   CTA head and neck showed right ICA terminal and proximal MCA occlusion, right CCA nonocclusive thrombus.  CT perfusion showed large penumbra.   Status post thrombectomy with right terminal ICA, right PCA and right ACA TICI2breperfusion however does have distal micro emboli.  MRI showed large right MCA infarct with 105mm MLS  and hemorrhagic transformation.   MRA showed patent intracranial vessels.  CT Head WO Contrast 3/26 620 - Improved midline shift and normalized left lateral ventricular volume after decompressive craniectomy. No progressive infarct or hemorrhage.  2D Echo- EF: 60-65%. No wall motion abnormality.  TCD Bubble - No PFO  Consider TEE Thursday  DVT US -No evidence of DVT bilaterally  LDL99  HgbA1c10.6  VTE prophylaxis -heparin IV  No antithromboticprior to admission, now on heparin IV, pharmacy managed  Therapy recommendations:CIR  Disposition:Pending  Cerebral edema  CT Headrepeat3/24 -Continued interval evolution of right MCA distribution infarct with associated hemorrhagic transformation. Worsened regional mass effect and edema with increased right-to-left midline shift now measuring up to 9 mm.Increased dilatation of the right lateral ventricle concerning for developing ventricular trapping. No other new acute intracranial abnormality.  CT repeat 3/25 unchangedright cerebral cytotoxic edema with central hemorrhage. Midline shift is similar at 1 cm.Unchanged left lateral ventricle dilatation with evidence of transependymal flow.  S/pdecompressive hemicraniectomy (3/25)with Dr. Venetia Maxon  CT Head WO Contrast 3/26 620 - Improved midline shift and normalized left lateral ventricular volume after decompressive craniectomy. No progressive infarct or hemorrhage.  On keppra 500 mg bid  3% salineoff  Na 137->141->147->150>158>150> 141>139>140>139  Staple removal on Friday 4/8 per neurosurg  Fever, resolved Leukocytosis  Tmax 101.7->102.5> 98.6> 98.7>99.9>afebrile  WBC - 16.2>15.7>16.8>18.6->21.5->19.6  Consider TEE Thursday to rule out cardioembolic source: Cards Master request sent.   UA (4/3) neg  CXR-left perihilar atelectasis  Respiratory Culture (3/26) - Strep Pneumoniae  Blood CxNo growth after5days drawn 3/25  Completed 7 day course  of Ceftriaxone on 4/2  Tachycardia and tachypnea Leukocytosis  WBC - 16.2>15.7>16.8>18.6->21.5->19.6  Likely related to ischemic right hand  Hospitalist team consulted and recommendations are pending  UA: negative  ABG: ph 7.4 pCO2 40 pao2 155    LE venous doppler repeat (4/3): No evidence of deep vein thrombosis   R critical limb ischemia  S/p thrombectomy (3/24) for right brachial artery occlusion  Now Right hand pain, cool to touch, dry eschar wound on the palmar region, decreased dopplers per RN, and tachycardia, tachypnea with leukocytosis  UE arterial duplex (4/3) - right ulnar artery occlusion.   VVS Dr. Chestine Spore - No surgical intervention at this time.  Recommends medical management with Heparin infusion.    Discussed with Dr. Yetta Barre with Neurosurgery ok'd the initiation of Heparin  Heparin infusion started on 4/3  Percocet 5/325 q 6hr prn for right hand pain  Hyperlipidemia  Home meds:none  LDL 99, goal < 70  On lipitor 40 now  Continue statin at discharge  Poorly controlled Diabetes type II, with hyperglycemia   Home meds:Metformin, Glargine, Aspart  HgbA1c 10.6, goal < 7.0  CBGs  SSI  Levemir to 35 units bid  Novolog 6 units every 4 hours  Feeding/Nutrition/Dysphagia  Feeding via cortrak  Nursing reported choking/coughing overnight.   SLP following: coughing with bedside eval today,        not appropriate for barium swallow yet  RD following  Xrays obtained and confirm feeding tube in       place,no acute findings   Cocaine abuse  UDS positive for cocaine  Cessation education will be provided  Tobacco abuse  Current smoker  Smoking cessation counselingwill beprovided  Other Stroke Risk Factors  Substance abuse - UDS: THC POSITIVEWill advise patientto stop using due to stroke risk.  Other Active Problems  Sick thyroid?- TSH: 0.307 (Low) Free T4:1.27 (high)    Hospital day # 14   This plan of care was  directed by Dr. Roda Shutters.   Janey Genta, NP-C  ATTENDING NOTE: I reviewed above note and agree with the assessment and plan. Pt was seen and examined.   Patient sitting in chair, no family at bedside, stated tired and would like to go back to bed.  Still has tenderness to touch on the right thumb and palm area.  Still has coldness in the fingers.  On heparin IV.  Core track dislodged which is confirmed by KUB.  Core track team contacted, will need reposition core track before tube feeding.  Continue to have leukocytosis, tachycardia.  Internal medicine team consulted.  Plan for TEE tomorrow.  Hold tube feeding after midnight.  For detailed assessment and plan, please refer to above as I have made changes wherever appropriate.   Marvel Plan, MD PhD Stroke Neurology 07/18/2020 7:37 PM     To contact Stroke Continuity provider, please refer to [email or web address]. After hours, contact General Neurology

## 2020-07-18 NOTE — Plan of Care (Signed)
  Problem: Education: Goal: Knowledge of General Education information will improve Description: Including pain rating scale, medication(s)/side effects and non-pharmacologic comfort measures Outcome: Progressing   Problem: Health Behavior/Discharge Planning: Goal: Ability to manage health-related needs will improve Outcome: Progressing   Problem: Clinical Measurements: Goal: Ability to maintain clinical measurements within normal limits will improve Outcome: Progressing Goal: Will remain free from infection Outcome: Progressing Goal: Diagnostic test results will improve Outcome: Progressing Goal: Respiratory complications will improve Outcome: Progressing Goal: Cardiovascular complication will be avoided Outcome: Progressing   Problem: Activity: Goal: Risk for activity intolerance will decrease Outcome: Progressing   Problem: Nutrition: Goal: Adequate nutrition will be maintained Outcome: Progressing   Problem: Coping: Goal: Level of anxiety will decrease Outcome: Progressing   Problem: Elimination: Goal: Will not experience complications related to bowel motility Outcome: Progressing Goal: Will not experience complications related to urinary retention Outcome: Progressing   Problem: Pain Managment: Goal: General experience of comfort will improve Outcome: Progressing   Problem: Safety: Goal: Ability to remain free from injury will improve Outcome: Progressing   Problem: Skin Integrity: Goal: Risk for impaired skin integrity will decrease Outcome: Progressing   Problem: Education: Goal: Knowledge of disease or condition will improve Outcome: Progressing Goal: Knowledge of secondary prevention will improve Outcome: Progressing Goal: Knowledge of patient specific risk factors addressed and post discharge goals established will improve Outcome: Progressing Goal: Individualized Educational Video(s) Outcome: Progressing   Problem: Coping: Goal: Will verbalize  positive feelings about self Outcome: Progressing Goal: Will identify appropriate support needs Outcome: Progressing   Problem: Health Behavior/Discharge Planning: Goal: Ability to manage health-related needs will improve Outcome: Progressing   Problem: Self-Care: Goal: Ability to participate in self-care as condition permits will improve Outcome: Progressing Goal: Verbalization of feelings and concerns over difficulty with self-care will improve Outcome: Progressing Goal: Ability to communicate needs accurately will improve Outcome: Progressing   Problem: Nutrition: Goal: Risk of aspiration will decrease Outcome: Progressing Goal: Dietary intake will improve Outcome: Progressing   Problem: Ischemic Stroke/TIA Tissue Perfusion: Goal: Complications of ischemic stroke/TIA will be minimized Outcome: Progressing   Problem: Safety: Goal: Non-violent Restraint(s) Outcome: Progressing   

## 2020-07-18 NOTE — Progress Notes (Signed)
ANTICOAGULATION CONSULT NOTE - Follow Up Consult  Pharmacy Consult for Heparin Indication: RUE brachial artery occlusion   Labs: Recent Labs    07/16/20 0654 07/16/20 1541 07/17/20 0004 07/17/20 0659 07/17/20 1201 07/17/20 2030 07/18/20 0428  HGB 9.7*  --  9.6*  --   --   --  10.0*  HCT 35.8*  --  35.5*  --   --   --  36.2  PLT 608*  --  626*  --   --   --  739*  HEPARINUNFRC <0.10*   < > <0.10*   < > <0.10* 0.11* 0.43  CREATININE 0.62  --  0.57  --   --   --  0.66   < > = values in this interval not displayed.    Assessment: 56 YOF who presented on 3/23 with an acute ischemic stroke s/p tPA 3/23 with hemorrhagic transformation requiring decompressive craniotomy by neurosurgery 3/25. 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 Ischemia noted 4/3 and VVS reconsulted - UE arterial duplex (4/3) - right ulnar artery occlusion. VVS Dr. Chestine Spore - No surgical intervention at this time.  Recommends medical management with Heparin infusion (started 4/3). Neuro and neurosurgery okayed start of heparin, low goal and no bolus. Head CT appears stable at this time.    Heparin level is therapeutic this morning at 0.43 with heparin running at >27 un/kg/hr (1950 un/hr). No issues noted overnight with infusion or bleeding. CBC stable.  Goal of Therapy:  Heparin level 0.3-0.5 units/ml Monitor platelets by anticoagulation protocol: Yes   Plan:  - Continue Heparin at 1950 units/hr  - No bolus and targeting low goal 0.3-0.5 - Repeat heparin level in 6 hours  - Will continue to monitor for any signs/symptoms of bleeding or problems with infusion.     Thank you for allowing [place] to participate in this patients care. Signe Colt, PharmD 07/18/2020 7:06 AM  Please check [email or web address] for unit-specific pharmacy phone numbers.

## 2020-07-18 NOTE — Progress Notes (Signed)
  Speech Language Pathology Treatment: Dysphagia;Cognitive-Linquistic  Patient Details Name: Brandi Myers MRN: [id number] DOB: [birth year] Today's Date: 07/18/2020 Time: 6160-7371 SLP Time Calculation (min) (ACUTE ONLY): 25 min  Assessment / Plan / Recommendation Clinical Impression  Brandi Myers was awake this morning for po trials and cognitive treatment- she is expectantly groggy as she recovers from significant stroke. Awareness and insight into deficits remains reduced however showed slight improvement today. Wants to walk and "gotta get out of this bed to get better." Her basic orientation has been mostly intact with focus on intellectual and emergent awareness of present challenges. Standing on her left she brought eyes to midline momentarily with tactile assist.  Consistent cough even after the thickest consistency (pudding) provided as well as explosive cough after thin. She needs instrumental testing to determine effectiveness of strategies/maneuvers during MBS for possible po recommendation. Will continue to work to improve her volitional cough which is currently a weak throat clear, sustain awareness and attention to tasks. Will plan to see tomorrow am to determine readiness for instrumental assessment.    HPI HPI: 35 yo female admitted with L sided weakness, given TPA for R MCA involving basal ganglia, frontal, temporal lobes  3/23 s/p BIL revascularization  R MCA ACA achieving 2B revascularization. Pt intubated 3/23-31. 3/24 R UE thrombectomy. CT revealed worsening midline shift. 3/25 decompressive hemicranitectomy, R side bone flap in abdomen. PMH current smoker uncontrolled DM HTn      SLP Plan  Continue with current plan of care       Recommendations  Diet recommendations: Other(comment) (ice chips when alert after oral care) Medication Administration: Via alternative means                Oral Care Recommendations: Oral care QID Follow up Recommendations: Inpatient  Rehab SLP Visit Diagnosis: Dysphagia, unspecified (R13.10);Dysarthria and anarthria (R47.1);Cognitive communication deficit (R41.841) Plan: Continue with current plan of care       GO           Functional Limitations: Attention    Royce Macadamia 07/18/2020, 10:53 AM Breck Coons Lonell Face.Ed Nurse, children's [phone] Office [phone]

## 2020-07-18 NOTE — Procedures (Signed)
Cortrak  Person Inserting Tube:  Brandi Myers, RD Tube Type:  Cortrak - 43 inches Tube Location:  Right nare Initial Placement:  Stomach Secured by: Bridle Technique Used to Measure Tube Placement:  Documented cm marking at nare/ corner of mouth Cortrak Secured At:  65 cm    RD consulted to advance existing tube. Tube advanced to 65 cm in the R nare and secured with existing bridle.   No x-ray is required. RN may begin using tube.   If the tube becomes dislodged please keep the tube and contact the Cortrak team at [email or web address] (password [email or web address]) for replacement.  If after hours and replacement cannot be delayed, place a NG tube and confirm placement with an abdominal x-ray.    Vanessa Kick RD, LDN Clinical Nutrition Pager listed in AMION

## 2020-07-18 NOTE — Progress Notes (Signed)
ANTICOAGULATION CONSULT NOTE - Follow Up Consult  Pharmacy Consult for Heparin Indication: RUE brachial artery occlusion   Labs: Recent Labs    07/16/20 0654 07/16/20 1541 07/17/20 0004 07/17/20 0659 07/17/20 2030 07/18/20 0428 07/18/20 0941  HGB 9.7*  --  9.6*  --   --  10.0*  --   HCT 35.8*  --  35.5*  --   --  36.2  --   PLT 608*  --  626*  --   --  739*  --   HEPARINUNFRC <0.10*   < > <0.10*   < > 0.11* 0.43 0.18*  CREATININE 0.62  --  0.57  --   --  0.66  --    < > = values in this interval not displayed.    Assessment: 47 YOF who presented on 3/23 with an acute ischemic stroke s/p tPA 3/23 with hemorrhagic transformation requiring decompressive craniotomy by neurosurgery 3/25. 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 Ischemia noted 4/3 and VVS reconsulted - UE arterial duplex (4/3) - right ulnar artery occlusion. VVS Dr. Chestine Spore - No surgical intervention at this time.  Recommends medical management with Heparin infusion (started 4/3). Neuro and neurosurgery okayed start of heparin, low goal and no bolus. Head CT appears stable at this time.    Repeat heparin level is SUBtherapeutic at 0.18 with heparin running at >27 un/kg/hr (1950 un/hr). No issues per RN with infusion or bleeding. CBC stable this morning.  Goal of Therapy:  Heparin level 0.3-0.5 units/ml Monitor platelets by anticoagulation protocol: Yes   Plan:  - Increase Heparin to 2100 units/hr  - No bolus and targeting low goal 0.3-0.5 - Repeat heparin level in 6 hours  - Will continue to monitor for any signs/symptoms of bleeding or problems with infusion.     Thank you for allowing [place] to participate in this patients care. Signe Colt, PharmD 07/18/2020 11:12 AM  Please check [email or web address] for unit-specific pharmacy phone numbers.

## 2020-07-18 NOTE — Progress Notes (Signed)
Physical Therapy Treatment Patient Details Name: Brandi Myers MRN: [id number] DOB: 05-13-1985 Today's Date: 07/18/2020    History of Present Illness 35 yo female admitted with L sided weakness givent TPA for R MCA involving basal ganglia frontal temporal lobes  3/23 s/p BIL revascularization  R MCA ACA achieving 2B revascularization PT intubated starting 3/23 . 3/24 R UE thromectomy CT reveals worsening midline shift start 3% saline 3/25 decompressive hemicranitectomy R side bone flap in abdomen 3/25 fever 103. Extubated 3/31. PMH current smoker uncontrolled DM HTn    PT Comments    Pt continues to demonstrate poor midline orientation as she tends to push herself to the L with her R UE despite cues. Pt needing maxAx2 for bed mobility and transfers using a stedy this date. Pt with improved motivation to participate and improve and improved tolerance to activity. Pt would greatly benefit from intensive therapies in the CIR setting to maximize her safety and independence with all functional mobility as she is young in age. Will continue to follow acutely.    Follow Up Recommendations  CIR (pt will need to demonstrate improved participation and endurance to become a good CIR candidate)     Equipment Recommendations  Wheelchair (measurements PT);Wheelchair cushion (measurements PT);Hospital bed (mechanical lift)    Recommendations for Other Services       Precautions / Restrictions Precautions Precautions: Fall Precaution Comments: bone flap out in R abdomen, L hemiparesis, NG tube, R mitten Restrictions Weight Bearing Restrictions: No    Mobility  Bed Mobility Overal bed mobility: Needs Assistance Bed Mobility: Supine to Sit     Supine to sit: Max assist;+2 for physical assistance;+2 for safety/equipment     General bed mobility comments: Pt cued to grab R bed rail with R hand but pt with pain at palm thus used her elbow to ascend trunk. MaxAx2 to assist with managing legs and  trunk.    Transfers Overall transfer level: Needs assistance Equipment used: Ambulation equipment used Transfers: Sit to/from [employer] Sit to Stand: Max assist;+2 physical assistance;+2 safety/equipment   Squat pivot transfers: Total assist;+2 physical assistance;+2 safety/equipment     General transfer comment: Cued pt to step feet up onto stedy platform and grab bar with R hand but pt with pain in R palm limiting her grasp. Pt pushing self to L, needing maxAx2 to prevent and to power up to stand in stedy. Pt not fully sitting in stedy when cued, needing time for pt to fatigue to completely sit down in stedy prior to transfers to chair with TA.  Ambulation/Gait                 Stairs             Wheelchair Mobility    Modified Rankin (Stroke Patients Only) Modified Rankin (Stroke Patients Only) Pre-Morbid Rankin Score: No symptoms Modified Rankin: Severe disability     Balance Overall balance assessment: Needs assistance Sitting-balance support: Single extremity supported;Feet supported Sitting balance-Leahy Scale: Zero Sitting balance - Comments: mod-maxA, pt pushing with R to L on occasion. Postural control: Left lateral lean Standing balance support: Single extremity supported Standing balance-Leahy Scale: Zero Standing balance comment: maxA x2 and R UE on stedy with pt pushing self to L.                            Cognition Arousal/Alertness: Awake/alert Behavior During Therapy: Flat affect;Impulsive;Restless Overall Cognitive Status: Impaired/Different from  baseline Area of Impairment: Attention;Following commands;Problem solving;Awareness;Safety/judgement;Memory                   Current Attention Level: Focused Memory: Decreased short-term memory;Decreased recall of precautions Following Commands: Follows one step commands with increased time;Follows one step commands inconsistently Safety/Judgement: Decreased  awareness of safety;Decreased awareness of deficits Awareness: Intellectual Problem Solving: Slow processing;Decreased initiation;Difficulty sequencing;Requires verbal cues;Requires tactile cues General Comments: Pt with poor awareness into her deficis, needing repeated multi-modal cues to attend to them and correct body position to ensure safety. Poor sequencing.      Exercises      General Comments General comments (skin integrity, edema, etc.): pt tachy with HR up to 130s with mobility      Pertinent Vitals/Pain Pain Assessment: Faces Faces Pain Scale: Hurts little more Pain Location: R wrist/hand Pain Descriptors / Indicators: Guarding;Grimacing Pain Intervention(s): Limited activity within patient's tolerance;Monitored during session;Repositioned    Home Living                      Prior Function            PT Goals (current goals can now be found in the care plan section) Acute Rehab PT Goals Patient Stated Goal: to get up and move PT Goal Formulation: With patient Time For Goal Achievement: 07/24/20 Potential to Achieve Goals: Fair Progress towards PT goals: Progressing toward goals (slowly)    Frequency    Min 4X/week      PT Plan Current plan remains appropriate    Co-evaluation              AM-PAC PT "6 Clicks" Mobility   Outcome Measure  Help needed turning from your back to your side while in a flat bed without using bedrails?: A Lot Help needed moving from lying on your back to sitting on the side of a flat bed without using bedrails?: A Lot Help needed moving to and from a bed to a chair (including a wheelchair)?: Total Help needed standing up from a chair using your arms (e.g., wheelchair or bedside chair)?: A Lot Help needed to walk in hospital room?: Total Help needed climbing 3-5 steps with a railing? : Total 6 Click Score: 9    End of Session Equipment Utilized During Treatment: Gait belt Activity Tolerance: Patient  tolerated treatment well Patient left: with call bell/phone within reach;in chair;with chair alarm set;with restraints reapplied Nurse Communication: Mobility status;Need for lift equipment PT Visit Diagnosis: Other abnormalities of gait and mobility (R26.89);Hemiplegia and hemiparesis;Other symptoms and signs involving the nervous system (R29.898);Muscle weakness (generalized) (M62.81);Difficulty in walking, not elsewhere classified (R26.2) Hemiplegia - Right/Left: Left Hemiplegia - dominant/non-dominant: Non-dominant Hemiplegia - caused by: Cerebral infarction     Time: 9233-0076 PT Time Calculation (min) (ACUTE ONLY): 38 min  Charges:  $Therapeutic Activity: 23-37 mins $Neuromuscular Re-education: 8-22 mins                     Raymond Gurney, PT, DPT Acute Rehabilitation Services  Pager: [phone] Office: [phone]    Jewel Baize 07/18/2020, 4:08 PM

## 2020-07-18 NOTE — Plan of Care (Signed)
  Problem: Self-Care: Goal: Verbalization of feelings and concerns over difficulty with self-care will improve Outcome: Progressing Goal: Ability to communicate needs accurately will improve Outcome: Progressing   Problem: Nutrition: Goal: Risk of aspiration will decrease Outcome: Progressing Goal: Dietary intake will improve Outcome: Progressing   Problem: Ischemic Stroke/TIA Tissue Perfusion: Goal: Complications of ischemic stroke/TIA will be minimized Outcome: Progressing

## 2020-07-19 ENCOUNTER — Inpatient Hospital Stay (HOSPITAL_COMMUNITY): Payer: Medicaid Other

## 2020-07-19 ENCOUNTER — Encounter (HOSPITAL_COMMUNITY)
Admission: EM | Disposition: A | Discharge status: Skilled Nursing Facility | Payer: Self-pay | Source: Other Acute Inpatient Hospital | Attending: Neurology

## 2020-07-19 ENCOUNTER — Inpatient Hospital Stay (HOSPITAL_COMMUNITY): Payer: Medicaid Other | Admitting: Certified Registered"

## 2020-07-19 ENCOUNTER — Encounter (HOSPITAL_COMMUNITY): Payer: Self-pay | Admitting: Neurology

## 2020-07-19 DIAGNOSIS — R000 Tachycardia, unspecified: Secondary | ICD-10-CM | POA: Diagnosis not present

## 2020-07-19 DIAGNOSIS — J9602 Acute respiratory failure with hypercapnia: Secondary | ICD-10-CM | POA: Diagnosis not present

## 2020-07-19 DIAGNOSIS — I639 Cerebral infarction, unspecified: Secondary | ICD-10-CM

## 2020-07-19 DIAGNOSIS — I10 Essential (primary) hypertension: Secondary | ICD-10-CM

## 2020-07-19 DIAGNOSIS — I6601 Occlusion and stenosis of right middle cerebral artery: Secondary | ICD-10-CM | POA: Diagnosis not present

## 2020-07-19 DIAGNOSIS — G936 Cerebral edema: Secondary | ICD-10-CM | POA: Diagnosis not present

## 2020-07-19 HISTORY — PX: TEE WITHOUT CARDIOVERSION: SHX5443

## 2020-07-19 HISTORY — PX: BUBBLE STUDY: SHX6837

## 2020-07-19 LAB — GLUCOSE, CAPILLARY
Glucose-Capillary: 156 mg/dL — ABNORMAL HIGH (ref 70–99)
Glucose-Capillary: 116 mg/dL — ABNORMAL HIGH (ref 70–99)
Glucose-Capillary: 124 mg/dL — ABNORMAL HIGH (ref 70–99)
Glucose-Capillary: 121 mg/dL — ABNORMAL HIGH (ref 70–99)
Glucose-Capillary: 97 mg/dL (ref 70–99)
Glucose-Capillary: 133 mg/dL — ABNORMAL HIGH (ref 70–99)
Glucose-Capillary: 125 mg/dL — ABNORMAL HIGH (ref 70–99)

## 2020-07-19 LAB — HEPARIN LEVEL (UNFRACTIONATED)
Heparin Unfractionated: 0.2 IU/mL — ABNORMAL LOW (ref 0.30–0.70)
Heparin Unfractionated: 0.62 IU/mL (ref 0.30–0.70)
Heparin Unfractionated: 0.64 IU/mL (ref 0.30–0.70)

## 2020-07-19 SURGERY — ECHOCARDIOGRAM, TRANSESOPHAGEAL
Anesthesia: Monitor Anesthesia Care

## 2020-07-19 MED ORDER — WHITE PETROLATUM EX OINT
TOPICAL_OINTMENT | CUTANEOUS | Status: AC
  Administered 2020-07-19: 0.2
  Filled 2020-07-19: qty 28.35

## 2020-07-19 MED ORDER — DEXTROSE-NACL 5-0.9 % IV SOLN: INTRAVENOUS | Status: DC

## 2020-07-19 MED ORDER — PROPOFOL 500 MG/50ML IV EMUL
INTRAVENOUS | Status: DC | PRN
  Administered 2020-07-19: 100 ug/kg/min via INTRAVENOUS

## 2020-07-19 MED ORDER — BISACODYL 10 MG RE SUPP: 10.0000 mg | Freq: Every day | RECTAL | Status: DC | PRN

## 2020-07-19 MED ORDER — LIDOCAINE 2% (20 MG/ML) 5 ML SYRINGE
INTRAMUSCULAR | Status: DC | PRN
  Administered 2020-07-19: 80 mg via INTRAVENOUS

## 2020-07-19 MED ORDER — FERROUS SULFATE 325 (65 FE) MG PO TABS
325.0000 mg | ORAL_TABLET | Freq: Two times a day (BID) | ORAL | Status: DC
  Administered 2020-07-20 (×2): 325 mg via ORAL
  Filled 2020-07-19 (×2): qty 1

## 2020-07-19 MED ORDER — SODIUM CHLORIDE 0.9 % IV SOLN: INTRAVENOUS | Status: DC

## 2020-07-19 MED ORDER — METOPROLOL TARTRATE 25 MG PO TABS
25.0000 mg | ORAL_TABLET | Freq: Two times a day (BID) | ORAL | Status: DC
  Administered 2020-07-19 – 2020-07-20 (×3): 25 mg via ORAL
  Filled 2020-07-19 (×3): qty 1

## 2020-07-19 MED ORDER — PROPOFOL 10 MG/ML IV BOLUS
INTRAVENOUS | Status: DC | PRN
  Administered 2020-07-19: 25 mg via INTRAVENOUS

## 2020-07-19 NOTE — Transfer of Care (Signed)
Immediate Anesthesia Transfer of Care Note  Patient: Brandi Myers  Procedure(s) Performed: TRANSESOPHAGEAL ECHOCARDIOGRAM (TEE) (N/A ) BUBBLE STUDY  Patient Location: Endoscopy Unit  Anesthesia Type:MAC  Level of Consciousness: drowsy  Airway & Oxygen Therapy: Patient Spontanous Breathing and Patient connected to nasal cannula oxygen  Post-op Assessment: Report given to RN and Post -op Vital signs reviewed and stable  Post vital signs: Reviewed and stable  Last Vitals:  Vitals Value Taken Time  BP    Temp    Pulse    Resp    SpO2      Last Pain:  Vitals:   07/19/20 1337  TempSrc: Oral  PainSc: 0-No pain      Patients Stated Pain Goal: 0 (36/85/99 2341)  Complications: No complications documented.

## 2020-07-19 NOTE — Anesthesia Procedure Notes (Signed)
Procedure Name: MAC Date/Time: 07/19/2020 1:57 PM Performed by: Imagene Riches, CRNA Pre-anesthesia Checklist: Patient identified, Emergency Drugs available, Suction available, Patient being monitored and Timeout performed Oxygen Delivery Method: Nasal cannula

## 2020-07-19 NOTE — Progress Notes (Signed)
STROKE TEAM PROGRESS NOTE   INTERVAL HISTORY No acute events  No family at bedside.    ST at bedside.  Brandi Myers lying in bed alert and interactive. Tracks and focuses on examiner on right and left side of bed (ST and myself). Brandi Myers asks to get out of bed. Urinating via external female incontinence catheter.  Right hand exam stable. Continues on heparin drip. No BM recorded since 4/2.   Vitals:   07/19/20 0450 07/19/20 0808 07/19/20 1239 07/19/20 1337  BP:  134/87 (!) 151/105 (!) 144/95  Pulse:  (!) 109 (!) 101 100  Resp:  19 20 20   Temp:  99 F (37.2 C) (!) 97.3 F (36.3 C) 98 F (36.7 C)  TempSrc:  Axillary Oral Oral  SpO2:  97% 100% 93%  Weight: 72.5 kg     Height:       CBC:  Recent Labs  Lab 07/17/20 0004 07/18/20 0428  WBC 21.5* 19.6*  HGB 9.6* 10.0*  HCT 35.5* 36.2  MCV 78.7* 78.0*  PLT 626* 739*   Basic Metabolic Panel:  Recent Labs  Lab 07/17/20 0004 07/18/20 0428  NA 138 137  K 4.0 4.1  CL 101 100  CO2 24 25  GLUCOSE 155* 140*  BUN 11 10  CREATININE 0.57 0.66  CALCIUM 9.3 9.5   Lipid Panel:  No results for input(s): CHOL, TRIG, HDL, CHOLHDL, VLDL, LDLCALC in the last 168 hours. HgbA1c: No results for input(s): HGBA1C in the last 168 hours. Urine Drug Screen: No results for input(s): LABOPIA, COCAINSCRNUR, LABBENZ, AMPHETMU, THCU, LABBARB in the last 168 hours.  Alcohol Level No results for input(s): ETH in the last 168 hours.  IMAGING past 24 hours CT HEAD WO CONTRAST  Result Date: 07/14/2020  IMPRESSION:  1. Evolving right MCA territory infarct with slightly decreased swelling and midline shift.  2. Improved sinus inflammation. 3. No new abnormality.    PHYSICAL EXAM Blood pressure (!) 144/95, pulse 100, temperature 98 F (36.7 C), temperature source Oral, resp. rate 20, height 5\' 5"  (1.651 m), weight 72.5 kg, SpO2 93 %.  General: caucasian female, lying in bed in NAD  Lungs: Symmetrical Chest rise, no labored breathing  Cardio: Regular  Rate and Rhythm  Abdomen: Soft, non-tender  Skin: right thumb mild discoloration with tenderness to touch, right fingers less cool, dry eschar wound on the palmar region unchanged with tender to touch.  Neuro - Awake, alert, eyes open,lethargic with psychomotor slowing,oriented to place, self, month.  No aphasiabut paucity of speech, moderate to severe dysarthria, followingmost of thesimple commands.Right gaze preference, not cross midline, trackingon the right,not blinking to visual threat on the left, PERRL.Left facial droop. Tongue midline.RUE and RLE spontaneous movement against gravity and LUE flaccid and LLE slight withdraw to pain.Sensation and coordination not cooperative, gait not tested.   ASSESSMENT/PLAN [name] is a 35 y.o. female with history of uncontrolled diabetesandhypertensionpresenting with left sided weakness and slurred speech.Per family there is a complicated social situation at this time. Brandi Myers lives intermittently in an apartment by herself but was kicked out and they went to pick her up at 5:30 AM. At that point Brandi Myers seemed "off"and generally quiet but did not seem to have any difficulty with left-sided weakness.Family attributed this to psychosocial issues and potential substance use noting that her sister passed away from an overdose in October. Later in the morning Brandi Myers complained of diffuse body pain and was administered gabapentin and a muscle relaxer.At 11:30 AM Brandi Myers stood  up and walkedto the showerand [id number] Brandi Myers was found on the bottom of the tub with slurred speech and difficulty moving the left side. Brandi Myers was not preactivated by EMS, but on arrival in the ED clearly appear to have a doorway examination consistent with a right MCA territory infarct.CTA revealed right ICA occlusion as well as a right common carotid bifurcation thrombus extending into the external carotid (felt to be the source of the occlusion). CT perfusion imaging demonstrated  possible core infarct of 51 mL (though notably this was obtained within 2 hours of symptom onset and therefore may reflect an overestimate of the true core). Mismatch ratio was 2 with a penumbra of 63 mL. Case was discussed with Dr. Corliss Skains of neuro interventional radiology and Dr. Otelia Limes of [facility] and the Brandi Myers was accepted for transfer to [facility] for emergent intervention. Brandi Myers was also started on TPA after consenting the Brandi Myers's emergency contact with full discussion of risks of bleeding and importance of accurately determining the Brandi Myers's last known well.Brandi Myers confirm the last known well as described above and agreed that the Brandi Myers would want TPA given the risk/benefit.Brandi Myers confirmed that the Brandi Myers had no recent procedures or other contraindications to TPA.Regarding her baseline, Brandi Myers is functional at baseline and family believes Brandi Myers is smoking at least1pack a day currently. Brandi Myers is on Metformin for her diabetes, does not take oral contraceptive pills, has had no recent surgeries or procedures.   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   CT no acute finding but suspicious for right MCA early ischemic changes.   CTA head and neck showed right ICA terminal and proximal MCA occlusion, right CCA nonocclusive thrombus.  CT perfusion showed large penumbra.   Status post thrombectomy with right terminal ICA, right PCA and right ACA TICI2breperfusion however does have distal micro emboli.  MRI showed large right MCA infarct with 63mm MLS and hemorrhagic transformation.   MRA showed patent intracranial vessels.  CT Head WO Contrast 3/26 620 -  Improved midline shift and normalized left lateral ventricular volume after decompressive craniectomy. No progressive infarct or hemorrhage.  2D Echo- EF: 60-65%. No wall motion abnormality.  TCD Bubble - No PFO  TEE today no source of emboli, no PFO  DVT US -No evidence of DVT bilaterally  LDL99  HgbA1c10.6  VTE prophylaxis -heparin IV  No antithromboticprior to admission, now on heparin IV, pharmacy managed  Therapy recommendations:CIR however no family support so SNF is working plan  Disposition:SNF due to lack of family support  Cerebral edema  CT Headrepeat3/24 -Continued interval evolution of right MCA distribution infarct with associated hemorrhagic transformation. Worsened regional mass effect and edema with increased right-to-left midline shift now measuring up to 9 mm.Increased dilatation of the right lateral ventricle concerning for developing ventricular trapping. No other new acute intracranial abnormality.  CT repeat 3/25 unchangedright cerebral cytotoxic edema with central hemorrhage. Midline shift is similar at 1 cm.Unchanged left lateral ventricle dilatation with evidence of transependymal flow.  S/pdecompressive hemicraniectomy (3/25)with Dr. Venetia Maxon  CT Head WO Contrast 3/26 620 - Improved midline shift and normalized left lateral ventricular volume after decompressive craniectomy. No progressive infarct or hemorrhage.  On keppra 500 mg bid  3% salineoff  Na 137->141->147->150>158>150> 141>139>140>139  Staple removal on  Friday 4/8 per neurosurg  Fever, resolved Leukocytosis  Tmax 101.7->102.5> 98.6> 98.7>99.9>afebrile  WBC - 16.2>15.7>16.8>18.6->21.5->19.6  TEE no endocarditis  UA (4/3) neg  CXR4/4-left perihilar atelectasis  CXR 4/7 No acute findings   Abd Xray 4/7 No acute findings  Respiratory Culture (3/26) - Strep Pneumoniae  Blood CxNo growth after5days drawn 3/25  Completed 7 day course of Ceftriaxone on  4/2  Tachycardia and tachypnea Leukocytosis  WBC - 16.2>15.7>16.8>18.6->21.5->19.6  Likely related to ischemic right hand  Hospitalist team consulted and concerning for mild hyperthyroidism - put on metoprolol 25mg  bid  UA: negative  ABG: ph 7.4 pCO2 40 pao2 155    LE venous doppler repeat (4/3): No evidence of deep vein thrombosis   R critical limb ischemia  S/p thrombectomy (3/24) for right brachial artery occlusion  Now Right hand pain, cool to touch, dry eschar wound on the palmar region, decreased dopplers per RN, and tachycardia, tachypnea with leukocytosis  UE arterial duplex (4/3) - right ulnar artery occlusion.   VVS Dr. 09-30-1980 - No surgical intervention at this time.  Recommends medical management with Heparin infusion.    Discussed with Dr. Chestine Spore with Neurosurgery ok'd the initiation of Heparin  Heparin infusion started on 4/3. Plan to transition to anticoagulation at discharge assuming no other issues per Dr. 6/3.  Percocet 5/325 q 6hr prn for right hand pain  Hyperlipidemia  Home meds:none  LDL 99, goal < 70  On lipitor 40 now  Continue statin at discharge  Poorly controlled Diabetes type II, with hyperglycemia   Home meds:Metformin, Glargine, Aspart  HgbA1c 10.6, goal < 7.0  CBGs  SSI  Levemir to 35 units bid  Novolog 6 units every 4 hours  Dysphagia  Feeding via cortrak  cortrak removed during TEE today  SLP following: planning eval 4/8 -> to decide if cortrak needs to be re-placed.  RD following  On D5NS now  Cocaine abuse  UDS positive for cocaine  Cessation education will be provided  Sick thyroid vs. hyperthyrodism  TSH: 0.307 (Low) Free T4:1.27 (high)  On metoprolol 25 bid  Repeat TSH and FT4 pending   IDA  BID Ferrous sulfate  Iron, Ferritin and TIBC are pending   Tobacco abuse  Current smoker  Smoking cessation counselingwill beprovided  Other Stroke Risk Factors  Substance abuse - UDS:  THC POSITIVEWill advise patientto stop using due to stroke risk.  Other Active Problems    Hospital day # 15   This plan of care was directed by Dr. 6/8.   Roda Shutters, NP-C   ATTENDING NOTE: I reviewed above note and agree with the assessment and plan. Pt was seen and examined.   Brandi Myers sitting in chair, no family at bedside.  Awake alert, still lethargic, dysarthric but following commands.  Still complaining of right hand pain.  Neurologically unchanged, right hand status unchanged.  TEE today showed no PFO, no endocarditis, no cardiac source of stroke.  Core track removed during TEE, plan for speech evaluation tomorrow and will decide on core track tomorrow.  Now on D5 normal saline.  Repeat TSH and FT4 tomorrow.  Previous TSH and free T4 indicating hyperthyroidism, on metoprolol 25 twice daily, tachycardia improved.  CBC showed low MCV and MCH, put on iron pills, pending iron and ferritin in a.m.  Scalp staples will be removed tomorrow.  For detailed assessment and plan, please refer to above as I have made changes wherever appropriate.   Janey Genta, MD PhD Stroke Neurology 07/19/2020  6:52 PM      To contact Stroke Continuity provider, please refer to [email or web address]. After hours, contact General Neurology

## 2020-07-19 NOTE — Progress Notes (Signed)
  Speech Language Pathology Treatment: Cognitive-Linquistic  Patient Details Name: Brandi Myers MRN: [id number] DOB: Dec 10, 1985 Today's Date: 07/19/2020 Time: 0102-7253 SLP Time Calculation (min) (ACUTE ONLY): 10 min  Assessment / Plan / Recommendation Clinical Impression  Brandi Myers participated in cognitive intervention this morning and to determine appropriateness for MBS today. Pt was adequately alert and responsive but has TEE scheduled for this afternoon and she is NPO, therefore, MBS will be completed tomorrow.  She was better able to attend to therapist/conversation and maintained top with fewer irrelevant utterances. Oriented to month. Increased awareness of left side and turned head to left when requested to look at NP standing on her left. Pt needs continued treatment for increased independence for ADL's.   HPI HPI: 35 yo female admitted with L sided weakness, given TPA for R MCA involving basal ganglia, frontal, temporal lobes  3/23 s/p BIL revascularization  R MCA ACA achieving 2B revascularization. Pt intubated 3/23-31. 3/24 R UE thrombectomy. CT revealed worsening midline shift. 3/25 decompressive hemicranitectomy, R side bone flap in abdomen. PMH current smoker uncontrolled DM HTn      SLP Plan  Continue with current plan of care       Recommendations                   Oral Care Recommendations: Oral care QID Follow up Recommendations: Inpatient Rehab SLP Visit Diagnosis: Cognitive communication deficit (G64.403) Plan: Continue with current plan of care                      Brandi Myers 07/19/2020, 2:48 PM Brandi Myers.Ed Nurse, children's [phone] Office [phone]

## 2020-07-19 NOTE — Progress Notes (Signed)
Inpatient Rehabilitation Admissions Coordinator   I spoke with patient's niece, by phone, Kendal Hymen. Patient's Dad is at [facility] with a fracture and will need SNF placement short term. Kendal Hymen referred me to [facility] for whom patient was living since December due to an eviction. [name] and the family are unable to provide the 24/7 physical care that this patient will require after any short term rehab. They are requesting SNF. I will alert acute team and TOC. We will sign off at this time.  Ottie Glazier, RN, MSN Rehab Admissions Coordinator [phone] 07/19/2020 12:23 PM

## 2020-07-19 NOTE — Progress Notes (Signed)
Physical Therapy Treatment Patient Details Name: Brandi Myers MRN: [id number] DOB: 05-02-1985 Today's Date: 07/19/2020    History of Present Illness 35 yo female admitted with L sided weakness givent TPA for R MCA involving basal ganglia frontal temporal lobes  3/23 s/p BIL revascularization  R MCA ACA achieving 2B revascularization PT intubated starting 3/23 . 3/24 R UE thromectomy CT reveals worsening midline shift start 3% saline 3/25 decompressive hemicranitectomy R side bone flap in abdomen 3/25 fever 103. Extubated 3/31. PMH current smoker uncontrolled DM HTn    PT Comments    Pt admitted with above diagnosis. Pt was able to sit EOB for 10 minutes and work on sitting balance and then transfer to chair with all aspects of mobility max assist of 2.  Pt with poor awareness of deficits and impulsivity as well making transitions difficult.  Will need SNF for rehab.  Pt currently with functional limitations due to the deficits listed below (see PT Problem List). Pt will benefit from skilled PT to increase their independence and safety with mobility to allow discharge to the venue listed below.     Follow Up Recommendations  SNF (CIR denied pt)     Equipment Recommendations  Wheelchair (measurements PT);Wheelchair cushion (measurements PT);Hospital bed (mechanical lift)    Recommendations for Other Services       Precautions / Restrictions Precautions Precautions: Fall Precaution Comments: bone flap out in R abdomen, L hemiparesis, NG tube, R mitten Restrictions Weight Bearing Restrictions: No    Mobility  Bed Mobility Overal bed mobility: Needs Assistance Bed Mobility: Supine to Sit Rolling: Max assist   Supine to sit: Max assist;+2 for physical assistance     General bed mobility comments: Pt cued to grab R bed rail with R hand but pt with pain at palm thus used her elbow to ascend trunk. MaxAx2 to assist with managing legs and trunk.    Transfers Overall transfer level:  Needs assistance   Transfers: Sit to/from [employer] Transfers Sit to Stand: Max assist;+2 physical assistance;+2 safety/equipment   Squat pivot transfers: Total assist;+2 physical assistance;+2 safety/equipment     General transfer comment: Cued pt to step feet up onto stedy platform and grab bar with R hand but pt with pain in R palm limiting her grasp. Pt pushing self to L, needing maxAx2 and unable to stand to [place].  Worked on sitting balance at EOB then squat pivot to drop arm recliner. Pt needing constant max assist throughout to sit EOB.  Ambulation/Gait                 Stairs             Wheelchair Mobility    Modified Rankin (Stroke Patients Only) Modified Rankin (Stroke Patients Only) Pre-Morbid Rankin Score: No symptoms Modified Rankin: Severe disability     Balance Overall balance assessment: Needs assistance Sitting-balance support: Single extremity supported;Feet supported Sitting balance-Leahy Scale: Zero Sitting balance - Comments: mod-maxA, pt pushing with R to L off and on entire treatment.  Pt sat best when holding on to end of bed with the right hand and was able to attain midline for a brief few seconds when she let go of the end of the bed however then heavy left lean quickly unless constantly cued for upright position. Postural control: Left lateral lean Standing balance support: Single extremity supported Standing balance-Leahy Scale: Zero Standing balance comment: maxA x2 and R UE on stedy with pt pushing self to L.Unable to  stand more than a few inches and pt unsafe.                            Cognition Arousal/Alertness: Awake/alert Behavior During Therapy: Flat affect;Impulsive;Restless Overall Cognitive Status: Impaired/Different from baseline Area of Impairment: Attention;Following commands;Problem solving;Awareness;Safety/judgement;Memory                   Current Attention Level: Focused Memory:  Decreased short-term memory;Decreased recall of precautions Following Commands: Follows one step commands with increased time;Follows one step commands inconsistently Safety/Judgement: Decreased awareness of safety;Decreased awareness of deficits Awareness: Intellectual Problem Solving: Slow processing;Decreased initiation;Difficulty sequencing;Requires verbal cues;Requires tactile cues General Comments: Pt with poor awareness into her deficis, needing repeated multi-modal cues to attend to them and correct body position to ensure safety. Poor sequencing.      Exercises Other Exercises Other Exercises: Neck extension in sitting intermittently with AAROM    General Comments General comments (skin integrity, edema, etc.): VSS      Pertinent Vitals/Pain Pain Assessment: Faces Faces Pain Scale: Hurts little more Pain Location: R wrist/hand Pain Descriptors / Indicators: Guarding;Grimacing Pain Intervention(s): Limited activity within patient's tolerance;Monitored during session;Repositioned    Home Living                      Prior Function            PT Goals (current goals can now be found in the care plan section) Progress towards PT goals: Progressing toward goals    Frequency    Min 4X/week      PT Plan Discharge plan needs to be updated    Co-evaluation              AM-PAC PT "6 Clicks" Mobility   Outcome Measure  Help needed turning from your back to your side while in a flat bed without using bedrails?: A Lot Help needed moving from lying on your back to sitting on the side of a flat bed without using bedrails?: A Lot Help needed moving to and from a bed to a chair (including a wheelchair)?: Total Help needed standing up from a chair using your arms (e.g., wheelchair or bedside chair)?: A Lot Help needed to walk in hospital room?: Total Help needed climbing 3-5 steps with a railing? : Total 6 Click Score: 9    End of Session Equipment  Utilized During Treatment: Gait belt Activity Tolerance: Patient limited by fatigue Patient left: with call bell/phone within reach;in chair;with chair alarm set;with restraints reapplied Nurse Communication: Mobility status;Need for lift equipment PT Visit Diagnosis: Other abnormalities of gait and mobility (R26.89);Hemiplegia and hemiparesis;Other symptoms and signs involving the nervous system (R29.898);Muscle weakness (generalized) (M62.81);Difficulty in walking, not elsewhere classified (R26.2) Hemiplegia - Right/Left: Left Hemiplegia - dominant/non-dominant: Non-dominant Hemiplegia - caused by: Cerebral infarction     Time: 0938-1829 PT Time Calculation (min) (ACUTE ONLY): 30 min  Charges:  $Therapeutic Activity: 8-22 mins $Neuromuscular Re-education: 8-22 mins                     Rama Mcclintock M,PT Acute Rehab Services [phone] (pager)   Bevelyn Buckles 07/19/2020, 2:20 PM

## 2020-07-19 NOTE — Progress Notes (Signed)
Called by RN who reported pt with tachycardia. Has had tachycardia all day. Reviewed chart and consult note from Dr. Jerral Ralph.  EKG obtained which showed sinus tachycardia with no ST elevation or depression and normal QTc of 445.  Pt is hemodynamically stable.  Continue to monitor.

## 2020-07-19 NOTE — Progress Notes (Signed)
Orthopedic Tech Progress Note Patient Details:  Brandi Myers 1986/03/04 [id number] Ordered helmet  Patient ID: Brandi Myers, female   DOB: July 08, 1985, 35 y.o.   MRN: [id number]   Michelle Piper 07/19/2020, 2:59 PM

## 2020-07-19 NOTE — Progress Notes (Addendum)
Subjective: Patient reports "Just my hand (hurts)"    Objective: Vital signs in last 24 hours: Temp:  [97.3 F (36.3 C)-99.4 F (37.4 C)] 97.3 F (36.3 C) (04/07 1239) Pulse Rate:  [101-116] 101 (04/07 1239) Resp:  [18-23] 20 (04/07 1239) BP: (118-151)/(75-105) 151/105 (04/07 1239) SpO2:  [94 %-100 %] 100 % (04/07 1239) Weight:  [72.5 kg] 72.5 kg (04/07 0450)  Intake/Output from previous day: 04/06 0701 - 04/07 0700 In: -  Out: 1050 [Urine:1050] Intake/Output this shift: No intake/output data recorded.  Awake , moving right side. Responds to questions and participates with exam with limited attention span. Stes, "I'm not in kindergarten!" when asked to follow with eyes. Scalp and abdomen incisions healing well.   Lab Results: Recent Labs    07/17/20 0004 07/18/20 0428  WBC 21.5* 19.6*  HGB 9.6* 10.0*  HCT 35.5* 36.2  PLT 626* 739*   BMET Recent Labs    07/17/20 0004 07/18/20 0428  NA 138 137  K 4.0 4.1  CL 101 100  CO2 24 25  GLUCOSE 155* 140*  BUN 11 10  CREATININE 0.57 0.66  CALCIUM 9.3 9.5    Studies/Results: DG CHEST PORT 1 VIEW  Result Date: 07/18/2020 CLINICAL DATA:  Feeding tube dysfunction. EXAM: PORTABLE CHEST 1 VIEW COMPARISON:  07/16/2020 FINDINGS: Single view of the chest demonstrates a feeding tube that extends into the abdomen. The tube extends beyond the image. Both lungs are clear. Heart size is normal. Negative for a pneumothorax. IMPRESSION: 1. No acute chest abnormality. 2. Feeding tube extends into the abdomen but the tip is beyond the image. Electronically Signed   By: Richarda Overlie M.D.   On: 07/18/2020 10:52   DG Abd Portable 1V  Result Date: 07/18/2020 CLINICAL DATA:  Feeding tube dysfunction, nurse reports choking EXAM: PORTABLE ABDOMEN - 1 VIEW COMPARISON:  None. FINDINGS: Two partial images of the abdomen are submitted for review, one of two demonstrating non weighted enteric feeding tube tip projecting over the gastric fundus. A  substantial portion of the right hemiabdomen is excluded from images submitted for review. Nonobstructive pattern of bowel gas. IMPRESSION: Two partial images of the abdomen are submitted for review, one of two demonstrating non weighted enteric feeding tube tip projecting over the gastric fundus. Electronically Signed   By: Lauralyn Primes M.D.   On: 07/18/2020 10:52    Assessment/Plan:   LOS: 15 days  Plan to remove scalp staples tomorrow. Continue therapies. Awaiting SNF as home situation does not meet CIR requirements.     Georgiann Cocker 07/19/2020, 12:47 PM  Patient is awaiting placement.

## 2020-07-19 NOTE — H&P (View-Only) (Signed)
PROGRESS NOTE        PATIENT DETAILS Name: Brandi Myers Age: 35 y.o. Sex: female Date of Birth: 11-18-1985 Admit Date: 07/04/2020 Admitting Physician Kerney Elbe, MD [id number], [place]   Referring service: Stroke team/neurology  Reason for consultation: Tachycardia/leukocytosis  Brief Narrative: Brandi Myers is an 35 y.o. 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  Patient was given TPA-subsequently evaluated by IR-and underwent endovascular revascularization of the occluded right ICA, MCA and right ACA on 3/24.  Patient remains intubated post revascularization and remains in the ICU.Subsequently-patient developed hemorrhagic transformation with significant amount of cerebral cytotoxic edema requiring decompressive hemicraniectomy, she was also found to have acute right upper extremity ischemia due to occluded brachial artery-and underwent right upper extremity thrombectomy.  While in the [facility] did have fever that was attributed to MSSA/strep pneumonia and completed a 7-day course of ceftriaxone on 4/2.  Patient was subsequently stabilized-and transferred to the progressive care unit-over the past few days-she has developed persistent leukocytosis and tachycardia-hospitalist service was then consulted for further evaluation and recommendations.  Significant events: 3/23>> cerebral angiogram-endovascular revascularization of occluded right ICA supraclinoid segment, right MCA and right ACA 3/24>> right upper extremity thrombectomy including the right radial/ulnar and brachial artery 3/24>> fever 101F on 3/24 while in the ICU-intubated 3/25>> right craniectomy with placement of bone flap in the abdomen 3/23-3/31>>  ETT  Microbiology 3/25>> blood culture: Negative 3/26>> sputum culture: MSSA/strep pneumo 4/6>> blood culture: No growth  Antibiotics: Ancef: 3/23>> 3/25 Vancomycin: 3/27 x 1 Rocephin: 3/27>> 4/2  DVT Prophylaxis : IV Heparin  Subjective: Lying comfortably in bed-RN at bedside-no diarrhea-no sacral decubitus.  Afebrile overnight.  Heart rate in the low 100s this morning.   Assessment/Plan: Tachycardia/leukocytosis: Suspect that this is multifactorial in etiology-but predominantly to ischemic right hand-she still has pain in the right hand hand has an eschar.  Some of the tachycardia may be due to mild hypothyroidism as well.  Her initial blood cultures on 3/25 are negative-but patient had received several doses of Ancef preoperatively before these blood cultures were collected.  While in the ICU-she did have fever-that was attributed to postoperative fever-and due to Streptococcus/MSSA pneumonia-she was treated with a week's course of IV Rocephin.  Repeat blood culture on 4/6 remains negative.  She is afebrile.  Given that she had had 2 separate embolic events-UDS positive for cocaine-there is concern for underlying endocarditis.  TEE scheduled for later today.  For her tachycardia-mild hypothyroidism-starting low-dose metoprolol.  Will defer antithyroid medications for now-repeat TSH/free T4 in a few weeks.  Given her nontoxic-benign appearance-would continue to hold all antimicrobial therapy as you are doing.  Mild hypothyroidism: See above  Large right embolic MCA territory stroke with cerebral edema/hemorrhagic transformation s/p decompressive hemicraniectomy: Continues to have significant left-sided hemineglect/hemiparesis.  Management per neurology and neurosurgery.  Acute right upper extremity ischemia: Vascular surgery following-maintained on IV heparin.  Dysphagia: Due to CVA-NG tube feedings ongoing.  MSSA/strep pneumonia: Completed course of antimicrobial  therapy.  HLD: On statin  DM-2 (A1c 10.6 on 3/23): stable on Lantus 35 units twice daily, 6 units of NovoLog every 4 hours and SSI.  Recent Labs    07/18/20 2342 07/19/20  3953 07/19/20 0814  GLUCAP 225* 156* 116*     Discussed with primary service-Dr. Erlinda Hong  Diet: Diet Order            Diet NPO time specified  Diet effective now                  Code Status: Full code   Family Communication: Defer to primary service  Disposition Plan: Defer to primary service   Antimicrobial agents: Anti-infectives (From admission, onward)   Start     Dose/Rate Route Frequency Ordered Stop   07/08/20 0900  cefTRIAXone (ROCEPHIN) 2 g in sodium chloride 0.9 % 100 mL IVPB        2 g 200 mL/hr over 30 Minutes Intravenous Every 24 hours 07/08/20 0801 07/14/20 1132   07/08/20 0900  vancomycin (VANCOCIN) 1,750 mg in sodium chloride 0.9 % 500 mL IVPB        1,750 mg 250 mL/hr over 120 Minutes Intravenous  Once 07/08/20 0802 07/08/20 1149   07/06/20 2000  ceFAZolin (ANCEF) IVPB 1 g/50 mL premix        1 g 100 mL/hr over 30 Minutes Intravenous Every 8 hours 07/06/20 1400 07/07/20 0508   07/06/20 0945  ceFAZolin (ANCEF) IVPB 2g/100 mL premix        2 g 200 mL/hr over 30 Minutes Intravenous To Surgery 07/06/20 0855 07/07/20 0945   07/04/20 1541  ceFAZolin (ANCEF) 2-4 GM/100ML-% IVPB       Note to Pharmacy: Desiree Hane   : cabinet override      07/04/20 1541 07/05/20 0344       Time spent: 25- minutes-Greater than 50% of this time was spent in counseling, explanation of diagnosis, planning of further management, and coordination of care.  MEDICATIONS: Scheduled Meds: . atorvastatin  40 mg Per Tube q1800  . chlorhexidine gluconate (MEDLINE KIT)  15 mL Mouth Rinse BID  . Chlorhexidine Gluconate Cloth  6 each Topical Daily  . docusate  100 mg Per Tube BID  . ferrous sulfate  325 mg Oral BID WC  . gabapentin  600 mg Per Tube BID  . insulin aspart  0-15 Units Subcutaneous Q4H  .  insulin aspart  6 Units Subcutaneous Q4H  . insulin detemir  35 Units Subcutaneous BID  . levETIRAcetam  500 mg Per Tube BID  . mouth rinse  15 mL Mouth Rinse q12n4p  . metoprolol tartrate  25 mg Oral BID  . multivitamin with minerals  1 tablet Per Tube Daily  . polyethylene glycol  17 g Per Tube Daily  . sodium chloride flush  10-40 mL Intracatheter Q12H   Continuous Infusions: . sodium chloride Stopped (07/13/20 1028)  . sodium chloride 50 mL/hr at 07/19/20 0028  . feeding supplement (GLUCERNA 1.5 CAL) 1,000 mL (07/18/20 1700)  . heparin 2,250 Units/hr (07/19/20 0829)   PRN Meds:.sodium chloride, acetaminophen **OR** acetaminophen (TYLENOL) oral liquid 160 mg/5 mL **OR** acetaminophen, labetalol, ondansetron **OR** ondansetron (ZOFRAN) IV, oxyCODONE-acetaminophen, senna-docusate   PHYSICAL EXAM: Vital signs: Vitals:   07/18/20 2341 07/19/20 0340 07/19/20 0450 07/19/20 0808  BP: 126/75 126/81  134/87  Pulse: (!) 115 (!) 115  (!) 109  Resp: 20 20  19   Temp: 97.8 F (36.6 C) 98.1 F (36.7 C)  99 F (37.2 C)  TempSrc: Oral Oral  Axillary  SpO2: 95% 94%  97%  Weight:   72.5 kg   Height:       Filed Weights   07/15/20 0400  07/16/20 0400 07/19/20 0450  Weight: 71.3 kg 70.5 kg 72.5 kg   Body mass index is 26.6 kg/m.   Gen Exam:Alert awake-not in any distress HEENT:atraumatic, normocephalic Chest: B/L clear to auscultation anteriorly CVS:S1S2 regular Abdomen:soft non tender, non distended Extremities:no edema Neurology: Left hemineglect-left hemiplegia Skin: no rash  I have personally reviewed following labs and imaging studies  LABORATORY DATA: CBC: Recent Labs  Lab 07/14/20 0558 07/15/20 0409 07/16/20 0654 07/17/20 0004 07/18/20 0428  WBC 15.7* 16.8* 18.6* 21.5* 19.6*  HGB 9.0* 9.2* 9.7* 9.6* 10.0*  HCT 32.7* 33.6* 35.8* 35.5* 36.2  MCV 76.4* 76.9* 77.5* 78.7* 78.0*  PLT 495* 559* 608* 626* 739*    Basic Metabolic Panel: Recent Labs  Lab 07/14/20 0558  07/15/20 0409 07/16/20 0654 07/17/20 0004 07/18/20 0428  NA 139 140 139 138 137  K 3.8 3.8 3.9 4.0 4.1  CL 105 105 104 101 100  CO2 22 26 26 24 25   GLUCOSE 187* 134* 127* 155* 140*  BUN 9 8 12 11 10   CREATININE 0.57 0.47 0.62 0.57 0.66  CALCIUM 9.0 9.2 9.2 9.3 9.5    GFR: Estimated Creatinine Clearance: 97.9 mL/min (by C-G formula based on SCr of 0.66 mg/dL).  Liver Function Tests: No results for input(s): AST, ALT, ALKPHOS, BILITOT, PROT, ALBUMIN in the last 168 hours. No results for input(s): LIPASE, AMYLASE in the last 168 hours. No results for input(s): AMMONIA in the last 168 hours.  Coagulation Profile: No results for input(s): INR, PROTIME in the last 168 hours.  Cardiac Enzymes: No results for input(s): CKTOTAL, CKMB, CKMBINDEX, TROPONINI in the last 168 hours.  BNP (last 3 results) No results for input(s): PROBNP in the last 8760 hours.  Lipid Profile: No results for input(s): CHOL, HDL, LDLCALC, TRIG, CHOLHDL, LDLDIRECT in the last 72 hours.  Thyroid Function Tests: No results for input(s): TSH, T4TOTAL, FREET4, T3FREE, THYROIDAB in the last 72 hours.  Anemia Panel: No results for input(s): VITAMINB12, FOLATE, FERRITIN, TIBC, IRON, RETICCTPCT in the last 72 hours.  Urine analysis:    Component Value Date/Time   COLORURINE YELLOW 07/18/2020 1740   APPEARANCEUR HAZY (A) 07/18/2020 1740   LABSPEC 1.025 07/18/2020 1740   PHURINE 5.0 07/18/2020 1740   GLUCOSEU NEGATIVE 07/18/2020 1740   HGBUR NEGATIVE 07/18/2020 1740   BILIRUBINUR NEGATIVE 07/18/2020 1740   KETONESUR 80 (A) 07/18/2020 1740   PROTEINUR NEGATIVE 07/18/2020 1740   NITRITE NEGATIVE 07/18/2020 1740   LEUKOCYTESUR NEGATIVE 07/18/2020 1740    Sepsis Labs: Lactic Acid, Venous    Component Value Date/Time   LATICACIDVEN 1.1 07/16/2020 0904    MICROBIOLOGY: Recent Results (from the past 240 hour(s))  Expectorated Sputum Assessment w Gram Stain, Rflx to Resp Cult     Status: None    Collection Time: 07/17/20 10:13 PM   Specimen: Expectorated Sputum  Result Value Ref Range Status   Specimen Description EXPECTORATED SPUTUM  Final   Special Requests NONE  Final   Sputum evaluation   Final    Sputum specimen not acceptable for testing.  Please recollect.   RESULT CALLED TO, READ BACK BY AND VERIFIED WITH: Belinda Fisher RN 07/18/20 0449 JDW Performed at [facility] [address], [place], [place] [address]    Report Status 07/18/2020 FINAL  Final  Culture, blood (routine x 2)     Status: None (Preliminary result)   Collection Time: 07/18/20  3:50 PM   Specimen: BLOOD LEFT HAND  Result Value Ref Range Status  Specimen Description BLOOD LEFT HAND  Final   Special Requests   Final    BOTTLES DRAWN AEROBIC ONLY Blood Culture adequate volume   Culture   Final    NO GROWTH < 12 HOURS Performed at [facility], [address] [place], [place] [address]    Report Status PENDING  Incomplete  Culture, blood (routine x 2)     Status: None (Preliminary result)   Collection Time: 07/18/20  3:57 PM   Specimen: BLOOD LEFT WRIST  Result Value Ref Range Status   Specimen Description BLOOD LEFT WRIST  Final   Special Requests   Final    BOTTLES DRAWN AEROBIC ONLY Blood Culture adequate volume   Culture   Final    NO GROWTH < 12 HOURS Performed at [facility], [place] [address] [place], [place] [address]    Report Status PENDING  Incomplete    RADIOLOGY STUDIES/RESULTS: DG CHEST PORT 1 VIEW  Result Date: 07/18/2020 CLINICAL DATA:  Feeding tube dysfunction. EXAM: PORTABLE CHEST 1 VIEW COMPARISON:  07/16/2020 FINDINGS: Single view of the chest demonstrates a feeding tube that extends into the abdomen. The tube extends beyond the image. Both lungs are clear. Heart size is normal. Negative for a pneumothorax. IMPRESSION: 1. No acute chest abnormality. 2. Feeding tube extends into the abdomen but the tip is beyond the image. Electronically Signed   By: Markus Daft M.D.    On: 07/18/2020 10:52   DG Abd Portable 1V  Result Date: 07/18/2020 CLINICAL DATA:  Feeding tube dysfunction, nurse reports choking EXAM: PORTABLE ABDOMEN - 1 VIEW COMPARISON:  None. FINDINGS: Two partial images of the abdomen are submitted for review, one of two demonstrating non weighted enteric feeding tube tip projecting over the gastric fundus. A substantial portion of the right hemiabdomen is excluded from images submitted for review. Nonobstructive pattern of bowel gas. IMPRESSION: Two partial images of the abdomen are submitted for review, one of two demonstrating non weighted enteric feeding tube tip projecting over the gastric fundus. Electronically Signed   By: Eddie Candle M.D.   On: 07/18/2020 10:52     LOS: 15 days   Oren Binet, MD  [facility]    To contact the attending provider between 7A-7P or the covering provider during after hours 7P-7A, please log into the web site [email or web address] and access using universal  password for that web site. If you do not have the password, please call the hospital operator.  07/19/2020, 10:12 AM

## 2020-07-19 NOTE — Progress Notes (Addendum)
  Speech Language Pathology    Patient Details Name: Brandi Myers MRN: [id number] DOB: Oct 18, 1985 Today's Date: 07/19/2020 Time:  -     ADDENDUM: Pt is having TEE at 1300 today, therefore, will plan on MBS tomorrow.   Schedule MBS for pt today at 1330.    Breck Coons [place].Ed Nurse, children's [phone] Office [phone]

## 2020-07-19 NOTE — Interval H&P Note (Signed)
History and Physical Interval Note:  07/19/2020 1:44 PM  Brandi Myers  has presented today for surgery, with the diagnosis of STROKE.  The various methods of treatment have been discussed with the patient's aunt (patient is confused since her stroke and unable to consent). After consideration of risks, benefits and other options for treatment, the patient has consented to  Procedure(s): TRANSESOPHAGEAL ECHOCARDIOGRAM (TEE) (N/A) as a surgical intervention.  The patient's history has been reviewed, patient examined, no change in status, stable for surgery.  I have reviewed the patient's chart and labs.  Questions were answered to the patient's satisfaction.     Little Ishikawa

## 2020-07-19 NOTE — Progress Notes (Signed)
  Echocardiogram Echocardiogram Transesophageal has been performed.  Brandi Myers 07/19/2020, 2:36 PM

## 2020-07-19 NOTE — Anesthesia Preprocedure Evaluation (Addendum)
Anesthesia Evaluation  Patient identified by MRN, date of birth, ID band Patient unresponsive    Reviewed: Allergy & Precautions, NPO status , Patient's Chart, lab work & pertinent test results  Airway Mallampati: II  TM Distance: >3 FB Neck ROM: Full    Dental  (+) Teeth Intact, Dental Advisory Given   Pulmonary Current Smoker and Patient abstained from smoking.,    Pulmonary exam normal breath sounds clear to auscultation       Cardiovascular hypertension, Normal cardiovascular exam Rhythm:Regular Rate:Normal     Neuro/Psych S/p craniectomy  CVA    GI/Hepatic negative GI ROS, Neg liver ROS,   Endo/Other  diabetes, Type 2  Renal/GU negative Renal ROS     Musculoskeletal negative musculoskeletal ROS (+)   Abdominal   Peds  Hematology  (+) Blood dyscrasia, anemia ,   Anesthesia Other Findings Day of surgery medications reviewed with the patient.  Reproductive/Obstetrics                            Anesthesia Physical Anesthesia Plan  ASA: III  Anesthesia Plan: MAC   Post-op Pain Management:    Induction: Intravenous  PONV Risk Score and Plan: 1 and Propofol infusion and Treatment may vary due to age or medical condition  Airway Management Planned: Nasal Cannula and Natural Airway  Additional Equipment:   Intra-op Plan:   Post-operative Plan:   Informed Consent: I have reviewed the patients History and Physical, chart, labs and discussed the procedure including the risks, benefits and alternatives for the proposed anesthesia with the patient or authorized representative who has indicated his/her understanding and acceptance.     Dental advisory given, Consent reviewed with POA and History available from chart only  Plan Discussed with: CRNA and Anesthesiologist  Anesthesia Plan Comments: (Phone consent with patient's aunt Leander Rams at [phone].)       Anesthesia  Quick Evaluation

## 2020-07-19 NOTE — Anesthesia Postprocedure Evaluation (Signed)
Anesthesia Post Note  Patient: Brandi Myers  Procedure(s) Performed: TRANSESOPHAGEAL ECHOCARDIOGRAM (TEE) (N/A ) BUBBLE STUDY     Patient location during evaluation: Endoscopy Anesthesia Type: MAC Level of consciousness: awake and alert Pain management: pain level controlled Vital Signs Assessment: post-procedure vital signs reviewed and stable Respiratory status: spontaneous breathing, nonlabored ventilation, respiratory function stable and patient connected to nasal cannula oxygen Cardiovascular status: stable and blood pressure returned to baseline Postop Assessment: no apparent nausea or vomiting Anesthetic complications: no   No complications documented.  Last Vitals:  Vitals:   07/19/20 1435 07/19/20 1444  BP: (!) 144/100 (!) 140/92  Pulse: 95 96  Resp: (!) 21 17  Temp:    SpO2: 97% 95%    Last Pain:  Vitals:   07/19/20 1425  TempSrc: Tympanic  PainSc: Asleep                 Catalina Gravel

## 2020-07-19 NOTE — Progress Notes (Addendum)
ANTICOAGULATION CONSULT NOTE - Follow Up Consult  Pharmacy Consult for Heparin Indication: RUE exclusion  Allergies  Allergen Reactions  . Norco [Hydrocodone-Acetaminophen] Itching, Nausea And Vomiting and Swelling    Patient Measurements: Height: 5\' 5"  (165.1 cm) Weight: 72.5 kg (159 lb 13.3 oz) IBW/kg (Calculated) : 57  Vital Signs: Temp: 98.1 F (36.7 C) (04/07 0340) Temp Source: Oral (04/07 0340) BP: 126/81 (04/07 0340) Pulse Rate: 115 (04/07 0340)  Labs: Recent Labs    07/17/20 0004 07/17/20 0659 07/18/20 0428 07/18/20 0941 07/18/20 1842 07/19/20 0413  HGB 9.6*  --  10.0*  --   --   --   HCT 35.5*  --  36.2  --   --   --   PLT 626*  --  739*  --   --   --   HEPARINUNFRC <0.10*   < > 0.43 0.18* 0.31 0.20*  CREATININE 0.57  --  0.66  --   --   --    < > = values in this interval not displayed.    Estimated Creatinine Clearance: 97.9 mL/min (by C-G formula based on SCr of 0.66 mg/dL).  Assessment:  Anticoag: RUE occlusion noted in radial, ulnar, and distal brachial artery - s/p thrombectomy 3/24 with Vasc Surg. To start low-goal Hep no bolus 4/3 d/t concern for distal embolization w/ no further plans for intervention per VVS. Head CT stable. Hep level 0.20--below goal  Goal of Therapy:  Hep level 0.3-0.5 Monitor platelets by anticoagulation protocol: Yes   Plan:  Increase heparin 2250 units/hr HL in 6 hours    Sheniqua Carolan A. 6/3, PharmD, BCPS, FNKF Clinical Pharmacist [place] Please utilize Amion for appropriate phone number to reach the unit pharmacist [facility])  Addendum:   F/u heparin level 0.62, which is above goal. Will decrease rate to 2150 units/hr and get HL in 6 hours  Daulton Harbaugh [facility], PharmD, BCPS, FNKF Clinical Pharmacist  Please utilize Amion for appropriate phone number to reach the unit pharmacist [facility])     07/19/2020,7:41 AM

## 2020-07-19 NOTE — Progress Notes (Addendum)
Nutrition Follow-up  DOCUMENTATION CODES:  Not applicable  INTERVENTION:  Monitor for results of MBS. If pt does not pass, recommend replacing Cortrak and restarting TF. Recommend: Glucerna 1.5 at 50 ml/h (1200 ml per day)  Provides 1800 kcal, 99 gm protein, 911 ml free water daily  NUTRITION DIAGNOSIS:  Inadequate oral intake related to inability to eat as evidenced by NPO status. - Ongoing.   GOAL:  Patient will meet greater than or equal to 90% of their needs - not met since TF has been stopped  MONITOR:  TF tolerance,Diet advancement  REASON FOR ASSESSMENT:  Consult Enteral/tube feeding initiation and management,Assessment of nutrition requirement/status  ASSESSMENT:  Pt with PMH of HTN and DM who admitted with L sided weakness and slurred speech per CT pt with R CVA.   3/23 s/p tPA and IR; intubated 3/24 hemorrhagic transformation, RUE thrombectomy  3/25 s/p R crani with placement of bone flap in R abd 3/28 cortrak placed; tip gastric  3/29 off arctic sun; desat with PT 3/31 extubated  4/06 cortrak advanced after having been dislodged 4/07 cortrak removed   Discussed pt with RN.   Pt with ischemic R hand and is c/o pain. Pt had TEE today due to concern for endocarditis; no cardiac source of embolism seen and negative bubble study per cardiology. Cortrak removed due to TEE.   Per SLP, plan for [facility] tomorrow. Monitor for results. If pt unable to pass, recommend replacing Cortrak and restarting current TF regimen -- Glucerna 1.5 @ 71m/hr  UOP: 1051mx24 hours  Admit wt: 72.2 kg Current wt: 72.5 kg  Medications reviewed and include: colace, ferrous sulfate, SSI Q4H, novolog 6 units Q4H, levemir 35 units BID, MVI with minerals, miralax  IVF: D5-1/2 NS @ 7551mr Labs reviewed. CBGs: 124-121-97   Diet Order:   Diet Order    None     EDUCATION NEEDS:  No education needs have been identified at this time  Skin:  Skin Assessment: Reviewed RN Assessment  (incisions; venous stasis ulcer R hand)  Last BM:  4/6 type 7  Height:  Ht Readings from Last 1 Encounters:  07/06/20 5' 5" (1.651 m)  Weight:  Wt Readings from Last 1 Encounters:  07/19/20 72.5 kg  Ideal Body Weight:  56.8 kg BMI:  Body mass index is 26.6 kg/m.  Estimated Nutritional Needs:  Kcal:  1800-2000 Protein:  90-110 grams Fluid:  >1.8 L/day   AmaLarkin InaS, RD, LDN RD pager number and weekend/on-call pager number located in [place]

## 2020-07-19 NOTE — CV Procedure (Signed)
     TRANSESOPHAGEAL ECHOCARDIOGRAM   NAME:  Brandi Myers   MRN: [id number] DOB:  12-Jan-1986   ADMIT DATE: 07/04/2020  INDICATIONS: CVA  PROCEDURE:   Informed consent was obtained prior to the procedure. The risks, benefits and alternatives for the procedure were discussed and the patient comprehended these risks.  Risks include, but are not limited to, cough, sore throat, vomiting, nausea, somnolence, esophageal and stomach trauma or perforation, bleeding, low blood pressure, aspiration, pneumonia, infection, trauma to the teeth and death.    After a procedural time-out, the oropharynx was anesthetized and the patient was sedated by the anesthesia service. The transesophageal probe was inserted in the esophagus and stomach without difficulty and multiple views were obtained. Anesthesia was monitored by Elliot Dally, CRNA.   COMPLICATIONS:    There were no immediate complications.  FINDINGS:  No cardiac source of embolism seen.  Negative bubble study   [name] MD [facility]  [address], [address] [place], [place] [address] [phone]   2:28 PM

## 2020-07-19 NOTE — Progress Notes (Addendum)
PROGRESS NOTE        PATIENT DETAILS Name: Brandi Myers Age: 35 y.o. Sex: female Date of Birth: 04-07-1986 Admit Date: 07/04/2020 Admitting Physician Kerney Elbe, MD ZOX:WRUEAV, [place]   Referring service: Stroke team/neurology  Reason for consultation: Tachycardia/leukocytosis  Brief Narrative: Brandi Myers is an 35 y.o. 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  Patient was given TPA-subsequently evaluated by IR-and underwent endovascular revascularization of the occluded right ICA, MCA and right ACA on 3/24.  Patient remains intubated post revascularization and remains in the ICU.Subsequently-patient developed hemorrhagic transformation with significant amount of cerebral cytotoxic edema requiring decompressive hemicraniectomy, she was also found to have acute right upper extremity ischemia due to occluded brachial artery-and underwent right upper extremity thrombectomy.  While in the [facility] did have fever that was attributed to MSSA/strep pneumonia and completed a 7-day course of ceftriaxone on 4/2.  Patient was subsequently stabilized-and transferred to the progressive care unit-over the past few days-she has developed persistent leukocytosis and tachycardia-hospitalist service was then consulted for further evaluation and recommendations.  Significant events: 3/23>> cerebral angiogram-endovascular revascularization of occluded right ICA supraclinoid segment, right MCA and right ACA 3/24>> right upper extremity thrombectomy including the right radial/ulnar and brachial artery 3/24>> fever 101F on 3/24 while in the ICU-intubated 3/25>> right craniectomy with placement of bone flap in the abdomen 3/23-3/31>>  ETT  Microbiology 3/25>> blood culture: Negative 3/26>> sputum culture: MSSA/strep pneumo 4/6>> blood culture: No growth  Antibiotics: Ancef: 3/23>> 3/25 Vancomycin: 3/27 x 1 Rocephin: 3/27>> 4/2  DVT Prophylaxis : IV Heparin  Subjective: Lying comfortably in bed-RN at bedside-no diarrhea-no sacral decubitus.  Afebrile overnight.  Heart rate in the low 100s this morning.   Assessment/Plan: Tachycardia/leukocytosis: Suspect that this is multifactorial in etiology-but predominantly to ischemic right hand-she still has pain in the right hand hand has an eschar.  Some of the tachycardia may be due to mild hyperthyroidism as well.  Her initial blood cultures on 3/25 are negative-but patient had received several doses of Ancef preoperatively before these blood cultures were collected.  While in the ICU-she did have fever-that was attributed to postoperative fever-sputum cx was +ve for Streptococcus/MSSA pneumonia-she was treated with a week's course of IV Rocephin.  Repeat blood culture on 4/6 remains negative.  She is afebrile.  Given that she had had 2 separate embolic events-UDS positive for cocaine-there is concern for underlying endocarditis.  TEE scheduled for later today.  For her tachycardia-mild hyperthyroidism-starting low-dose metoprolol.  Will defer antithyroid medications for now-repeat TSH/free T4 in a few weeks.  Given her nontoxic-benign appearance-would continue to hold all antimicrobial therapy as you are doing.  Mild hyperthyroidism: See above  Large right embolic MCA territory stroke with cerebral edema/hemorrhagic transformation s/p decompressive hemicraniectomy: Continues to have significant left-sided hemineglect/hemiparesis.  Management per neurology and neurosurgery.  Acute right upper extremity ischemia: Vascular surgery following-maintained on IV heparin.  Dysphagia: Due to CVA-NG tube feedings ongoing.  MSSA/strep pneumonia: Completed course of antimicrobial  therapy.  HLD: On statin  DM-2 (A1c 10.6 on 3/23): stable on Lantus 35 units twice daily, 6 units of NovoLog every 4 hours and SSI.  Recent Labs    07/18/20  2342 07/19/20 0341 07/19/20 0814  GLUCAP 225* 156* 116*     Discussed with primary service-Dr. Erlinda Hong  Diet: Diet Order            Diet NPO time specified  Diet effective now                  Code Status: Full code   Family Communication: Defer to primary service  Disposition Plan: Defer to primary service   Antimicrobial agents: Anti-infectives (From admission, onward)   Start     Dose/Rate Route Frequency Ordered Stop   07/08/20 0900  cefTRIAXone (ROCEPHIN) 2 g in sodium chloride 0.9 % 100 mL IVPB        2 g 200 mL/hr over 30 Minutes Intravenous Every 24 hours 07/08/20 0801 07/14/20 1132   07/08/20 0900  vancomycin (VANCOCIN) 1,750 mg in sodium chloride 0.9 % 500 mL IVPB        1,750 mg 250 mL/hr over 120 Minutes Intravenous  Once 07/08/20 0802 07/08/20 1149   07/06/20 2000  ceFAZolin (ANCEF) IVPB 1 g/50 mL premix        1 g 100 mL/hr over 30 Minutes Intravenous Every 8 hours 07/06/20 1400 07/07/20 0508   07/06/20 0945  ceFAZolin (ANCEF) IVPB 2g/100 mL premix        2 g 200 mL/hr over 30 Minutes Intravenous To Surgery 07/06/20 0855 07/07/20 0945   07/04/20 1541  ceFAZolin (ANCEF) 2-4 GM/100ML-% IVPB       Note to Pharmacy: Desiree Hane   : cabinet override      07/04/20 1541 07/05/20 0344       Time spent: 25- minutes-Greater than 50% of this time was spent in counseling, explanation of diagnosis, planning of further management, and coordination of care.  MEDICATIONS: Scheduled Meds: . atorvastatin  40 mg Per Tube q1800  . chlorhexidine gluconate (MEDLINE KIT)  15 mL Mouth Rinse BID  . Chlorhexidine Gluconate Cloth  6 each Topical Daily  . docusate  100 mg Per Tube BID  . ferrous sulfate  325 mg Oral BID WC  . gabapentin  600 mg Per Tube BID  . insulin aspart  0-15 Units Subcutaneous Q4H  .  insulin aspart  6 Units Subcutaneous Q4H  . insulin detemir  35 Units Subcutaneous BID  . levETIRAcetam  500 mg Per Tube BID  . mouth rinse  15 mL Mouth Rinse q12n4p  . metoprolol tartrate  25 mg Oral BID  . multivitamin with minerals  1 tablet Per Tube Daily  . polyethylene glycol  17 g Per Tube Daily  . sodium chloride flush  10-40 mL Intracatheter Q12H   Continuous Infusions: . sodium chloride Stopped (07/13/20 1028)  . sodium chloride 50 mL/hr at 07/19/20 0028  . feeding supplement (GLUCERNA 1.5 CAL) 1,000 mL (07/18/20 1700)  . heparin 2,250 Units/hr (07/19/20 0829)   PRN Meds:.sodium chloride, acetaminophen **OR** acetaminophen (TYLENOL) oral liquid 160 mg/5 mL **OR** acetaminophen, labetalol, ondansetron **OR** ondansetron (ZOFRAN) IV, oxyCODONE-acetaminophen, senna-docusate   PHYSICAL EXAM: Vital signs: Vitals:   07/18/20 2341 07/19/20 0340 07/19/20 0450 07/19/20 0808  BP: 126/75 126/81  134/87  Pulse: (!) 115 (!) 115  (!) 109  Resp: _0 Temp: 97.8 F (36.6 C) 98.1 F (36.7 C)  99 F (37.2 C)  TempSrc: Oral Oral  Axillary  SpO2: 95% 94%  97%  Weight:   72.5 kg   Height:       [employer]  07/15/20 0400 07/16/20 0400 07/19/20 0450  Weight: 71.3 kg 70.5 kg 72.5 kg   Body mass index is 26.6 kg/m.   Gen Exam:Alert awake-not in any distress HEENT:atraumatic, normocephalic Chest: B/L clear to auscultation anteriorly CVS:S1S2 regular Abdomen:soft non tender, non distended Extremities:no edema Neurology: Left hemineglect-left hemiplegia Skin: no rash  I have personally reviewed following labs and imaging studies  LABORATORY DATA: CBC: Recent Labs  Lab 07/14/20 0558 07/15/20 0409 07/16/20 0654 07/17/20 0004 07/18/20 0428  WBC 15.7* 16.8* 18.6* 21.5* 19.6*  HGB 9.0* 9.2* 9.7* 9.6* 10.0*  HCT 32.7* 33.6* 35.8* 35.5* 36.2  MCV 76.4* 76.9* 77.5* 78.7* 78.0*  PLT 495* 559* 608* 626* 739*    Basic Metabolic Panel: Recent Labs  Lab 07/14/20 0558  07/15/20 0409 07/16/20 0654 07/17/20 0004 07/18/20 0428  NA 139 140 139 138 137  K 3.8 3.8 3.9 4.0 4.1  CL 105 105 104 101 100  CO2 _0 GLUCOSE 187* 134* 127* 155* 140*  BUN _1 CREATININE 0.57 0.47 0.62 0.57 0.66  CALCIUM 9.0 9.2 9.2 9.3 9.5    GFR: Estimated Creatinine Clearance: 97.9 mL/min (by C-G formula based on SCr of 0.66 mg/dL).  Liver Function Tests: No results for input(s): AST, ALT, ALKPHOS, BILITOT, PROT, ALBUMIN in the last 168 hours. No results for input(s): LIPASE, AMYLASE in the last 168 hours. No results for input(s): AMMONIA in the last 168 hours.  Coagulation Profile: No results for input(s): INR, PROTIME in the last 168 hours.  Cardiac Enzymes: No results for input(s): CKTOTAL, CKMB, CKMBINDEX, TROPONINI in the last 168 hours.  BNP (last 3 results) No results for input(s): PROBNP in the last 8760 hours.  Lipid Profile: No results for input(s): CHOL, HDL, LDLCALC, TRIG, CHOLHDL, LDLDIRECT in the last 72 hours.  Thyroid Function Tests: No results for input(s): TSH, T4TOTAL, FREET4, T3FREE, THYROIDAB in the last 72 hours.  Anemia Panel: No results for input(s): VITAMINB12, FOLATE, FERRITIN, TIBC, IRON, RETICCTPCT in the last 72 hours.  Urine analysis:    Component Value Date/Time   COLORURINE YELLOW 07/18/2020 1740   APPEARANCEUR HAZY (A) 07/18/2020 1740   LABSPEC 1.025 07/18/2020 1740   PHURINE 5.0 07/18/2020 1740   GLUCOSEU NEGATIVE 07/18/2020 1740   HGBUR NEGATIVE 07/18/2020 1740   BILIRUBINUR NEGATIVE 07/18/2020 1740   KETONESUR 80 (A) 07/18/2020 1740   PROTEINUR NEGATIVE 07/18/2020 1740   NITRITE NEGATIVE 07/18/2020 1740   LEUKOCYTESUR NEGATIVE 07/18/2020 1740    Sepsis Labs: Lactic Acid, Venous    Component Value Date/Time   LATICACIDVEN 1.1 07/16/2020 0904    MICROBIOLOGY: Recent Results (from the past 240 hour(s))  Expectorated Sputum Assessment w Gram Stain, Rflx to Resp Cult     Status: None    Collection Time: 07/17/20 10:13 PM   Specimen: Expectorated Sputum  Result Value Ref Range Status   Specimen Description EXPECTORATED SPUTUM  Final   Special Requests NONE  Final   Sputum evaluation   Final    Sputum specimen not acceptable for testing.  Please recollect.   RESULT CALLED TO, READ BACK BY AND VERIFIED WITH: Belinda Fisher RN 07/18/20 0449 JDW Performed at [facility] [address], [place], [place] [address]    Report Status 07/18/2020 FINAL  Final  Culture, blood (routine x 2)     Status: None (Preliminary result)   Collection Time: 07/18/20  3:50 PM   Specimen: BLOOD LEFT HAND  Result Value Ref  Range Status   Specimen Description BLOOD LEFT HAND  Final   Special Requests   Final    BOTTLES DRAWN AEROBIC ONLY Blood Culture adequate volume   Culture   Final    NO GROWTH < 12 HOURS Performed at [facility], [address] [place], [place] [address]    Report Status PENDING  Incomplete  Culture, blood (routine x 2)     Status: None (Preliminary result)   Collection Time: 07/18/20  3:57 PM   Specimen: BLOOD LEFT WRIST  Result Value Ref Range Status   Specimen Description BLOOD LEFT WRIST  Final   Special Requests   Final    BOTTLES DRAWN AEROBIC ONLY Blood Culture adequate volume   Culture   Final    NO GROWTH < 12 HOURS Performed at [facility], [place] [address] [place],  [address]    Report Status PENDING  Incomplete    RADIOLOGY STUDIES/RESULTS: DG CHEST PORT 1 VIEW  Result Date: 07/18/2020 CLINICAL DATA:  Feeding tube dysfunction. EXAM: PORTABLE CHEST 1 VIEW COMPARISON:  07/16/2020 FINDINGS: Single view of the chest demonstrates a feeding tube that extends into the abdomen. The tube extends beyond the image. Both lungs are clear. Heart size is normal. Negative for a pneumothorax. IMPRESSION: 1. No acute chest abnormality. 2. Feeding tube extends into the abdomen but the tip is beyond the image. Electronically Signed   By: Markus Daft M.D.    On: 07/18/2020 10:52   DG Abd Portable 1V  Result Date: 07/18/2020 CLINICAL DATA:  Feeding tube dysfunction, nurse reports choking EXAM: PORTABLE ABDOMEN - 1 VIEW COMPARISON:  None. FINDINGS: Two partial images of the abdomen are submitted for review, one of two demonstrating non weighted enteric feeding tube tip projecting over the gastric fundus. A substantial portion of the right hemiabdomen is excluded from images submitted for review. Nonobstructive pattern of bowel gas. IMPRESSION: Two partial images of the abdomen are submitted for review, one of two demonstrating non weighted enteric feeding tube tip projecting over the gastric fundus. Electronically Signed   By: Eddie Candle M.D.   On: 07/18/2020 10:52     LOS: 15 days   Oren Binet, MD  [facility]    To contact the attending provider between 7A-7P or the covering provider during after hours 7P-7A, please log into the web site [email or web address] and access using universal [place] password for that web site. If you do not have the password, please call the hospital operator.  07/19/2020, 10:12 AM

## 2020-07-20 ENCOUNTER — Inpatient Hospital Stay (HOSPITAL_COMMUNITY): Payer: Medicaid Other

## 2020-07-20 ENCOUNTER — Encounter (HOSPITAL_COMMUNITY): Payer: Self-pay | Admitting: Radiology

## 2020-07-20 DIAGNOSIS — G936 Cerebral edema: Secondary | ICD-10-CM | POA: Diagnosis not present

## 2020-07-20 DIAGNOSIS — J9602 Acute respiratory failure with hypercapnia: Secondary | ICD-10-CM | POA: Diagnosis not present

## 2020-07-20 DIAGNOSIS — R000 Tachycardia, unspecified: Secondary | ICD-10-CM | POA: Diagnosis not present

## 2020-07-20 DIAGNOSIS — I6601 Occlusion and stenosis of right middle cerebral artery: Secondary | ICD-10-CM | POA: Diagnosis not present

## 2020-07-20 LAB — FERRITIN: Ferritin: 25 ng/mL (ref 11–307)

## 2020-07-20 LAB — CBC
HCT: 32.1 % — ABNORMAL LOW (ref 36.0–46.0)
Hemoglobin: 8.8 g/dL — ABNORMAL LOW (ref 12.0–15.0)
MCH: 21.5 pg — ABNORMAL LOW (ref 26.0–34.0)
MCHC: 27.4 g/dL — ABNORMAL LOW (ref 30.0–36.0)
MCV: 78.5 fL — ABNORMAL LOW (ref 80.0–100.0)
Platelets: 882 10*3/uL — ABNORMAL HIGH (ref 150–400)
RBC: 4.09 MIL/uL (ref 3.87–5.11)
RDW: 26.5 % — ABNORMAL HIGH (ref 11.5–15.5)
WBC: 14.8 10*3/uL — ABNORMAL HIGH (ref 4.0–10.5)
nRBC: 0 % (ref 0.0–0.2)

## 2020-07-20 LAB — GLUCOSE, CAPILLARY
Glucose-Capillary: 115 mg/dL — ABNORMAL HIGH (ref 70–99)
Glucose-Capillary: 123 mg/dL — ABNORMAL HIGH (ref 70–99)
Glucose-Capillary: 125 mg/dL — ABNORMAL HIGH (ref 70–99)
Glucose-Capillary: 141 mg/dL — ABNORMAL HIGH (ref 70–99)
Glucose-Capillary: 170 mg/dL — ABNORMAL HIGH (ref 70–99)
Glucose-Capillary: 95 mg/dL (ref 70–99)

## 2020-07-20 LAB — BASIC METABOLIC PANEL
Anion gap: 9 (ref 5–15)
BUN: 6 mg/dL (ref 6–20)
CO2: 25 mmol/L (ref 22–32)
Calcium: 9.1 mg/dL (ref 8.9–10.3)
Chloride: 105 mmol/L (ref 98–111)
Creatinine, Ser: 0.59 mg/dL (ref 0.44–1.00)
GFR, Estimated: >60 mL/min (ref >60)
Glucose, Bld: 119 mg/dL — ABNORMAL HIGH (ref 70–99)
Potassium: 3.9 mmol/L (ref 3.5–5.1)
Sodium: 139 mmol/L (ref 135–145)

## 2020-07-20 LAB — IRON AND TIBC
Iron: 21 ug/dL — ABNORMAL LOW (ref 28–170)
Saturation Ratios: 5 % — ABNORMAL LOW (ref 10.4–31.8)
TIBC: 449 ug/dL (ref 250–450)
UIBC: 428 ug/dL

## 2020-07-20 LAB — HEPARIN LEVEL (UNFRACTIONATED)
Heparin Unfractionated: <0.1 IU/mL — ABNORMAL LOW (ref 0.30–0.70)
Heparin Unfractionated: <0.1 IU/mL — ABNORMAL LOW (ref 0.30–0.70)
Heparin Unfractionated: 0.13 IU/mL — ABNORMAL LOW (ref 0.30–0.70)

## 2020-07-20 LAB — T4, FREE: Free T4: 1 ng/dL (ref 0.61–1.12)

## 2020-07-20 LAB — TSH: TSH: 1.001 u[IU]/mL (ref 0.350–4.500)

## 2020-07-20 MED ORDER — BISACODYL 10 MG RE SUPP
10.0000 mg | Freq: Once | RECTAL | Status: AC
  Administered 2020-07-20: 10 mg via RECTAL
  Filled 2020-07-20: qty 1

## 2020-07-20 MED ORDER — SODIUM CHLORIDE 0.9 % IV SOLN: INTRAVENOUS | Status: DC

## 2020-07-20 NOTE — Progress Notes (Signed)
[facility]   Objective (!) 161/82 91 97.9 F (36.6 C) (Axillary) (!) 21 96%  Intake/Output Summary (Last 24 hours) at 07/20/2020 1259 Last data filed at 07/19/2020 1852 Gross per 24 hour  Intake 200 ml  Output --  Net 200 ml   Right radial signal triphasic at the wrist with pretty brisk ulnar signal as well Right antecubital incision healing with small separation but clean and dry Small eschar in the palmar surface of the right hand stable since initial evaluation  Laboratory Lab Results: Recent Labs    07/18/20 0428 07/20/20 0405  WBC 19.6* 14.8*  HGB 10.0* 8.8*  HCT 36.2 32.1*  PLT 739* 882*   BMET Recent Labs    07/18/20 0428 07/20/20 0405  NA 137 139  K 4.1 3.9  CL 100 105  CO2 25 25  GLUCOSE 140* 119*  BUN 10 6  CREATININE 0.66 0.59  CALCIUM 9.5 9.1    COAG Lab Results  Component Value Date   INR 1.2 07/04/2020   No results found for: PTT  Assessment/Planning:  35 year old female with complex hospital course that has undergone right upper extremity thrombectomy including the right brachial, radial and ulnar artery.  She continues to have a triphasic radial signal at the wrist and what sounds like a pretty good ulnar signal as well.  Recent duplex suggested ulnar partially occluded.  I had trouble passing fogarty catheter down ulnar artery.  The eschar on her palmar surface is stable.  Seems to be tolerating heparin.  My ultimate plan would be likely transition to a DOAC when closer to discharge and then follow-up as an outpatient in 4 weeks or so to see how she is doing if she continues to recover from her stroke.  Cephus Shelling 07/20/2020 12:59 PM --

## 2020-07-20 NOTE — Consult Note (Signed)
[facility]       Reason for Consult: positive sputum cultures    Referring Physician: Dr. Sloan Leiter  Active Problems:   Stroke (cerebrum) [facility])   Middle cerebral artery embolism, right   . atorvastatin  40 mg Per Tube q1800  . bisacodyl  10 mg Rectal Once  . chlorhexidine gluconate (MEDLINE KIT)  15 mL Mouth Rinse BID  . Chlorhexidine Gluconate Cloth  6 each Topical Daily  . docusate  100 mg Per Tube BID  . ferrous sulfate  325 mg Oral BID WC  . gabapentin  600 mg Per Tube BID  . insulin aspart  0-15 Units Subcutaneous Q4H  . insulin aspart  6 Units Subcutaneous Q4H  . insulin detemir  35 Units Subcutaneous BID  . levETIRAcetam  500 mg Per Tube BID  . mouth rinse  15 mL Mouth Rinse q12n4p  . metoprolol tartrate  25 mg Oral BID  . multivitamin with minerals  1 tablet Per Tube Daily  . polyethylene glycol  17 g Per Tube Daily  . sodium chloride flush  10-40 mL Intracatheter Q12H    Recommendations:  continue to observe off of antibiotics  Assessment: She has had two episodes of emboli with right embolic MCA stroke and right upper extremity ischemia and with her drug history, endocarditis certainly of consideration.  However, she does not have any positive blood cultures, has largely been afebrile off of antibiotics save for one episode of pneumonia that responded to antibiotics and I would not anticipate respiratory cultures representative of a potential left sided endocarditis (no shunt noted on TEE).  Therefore, I do not consider endocarditis likely and no further antibiotics indicated.    Thanks for consultation  Antibiotics: Ceftriaxone x 7 days  HPI: Brandi Myers is a 35 y.o. female with a history of DM, HTN, cocaine abuse, tobacco abuse, who came in 3/23 with acute left sided weakness and slurred speech and code stroke was activated due to acute right MCA stroke.  She receive IV tPA IR intervention and also had a right upper extremity  thrombus that required thrombectomy on 3/24.  She was intubated at that time and then underwent right craniectomy and was extubated on 3/31.  She had fever 3/26 post op and received a dose of vancomycin and started on ceftriaxone which she took for 7 days.  Trach aspirate grew MSSA and Strep pneumoniae.  No opacities or concerns on admission in lungs.  WBC stable and 14.8 today.  She has remained afebrile.  Her blood cultures were done while on antibiotics and concern if the embolic events could be related to endocarditis.  TEE was negative.  She is asking me to take the cat out.  History mainly from the chart.     Review of Systems:  Unable to be assessed due to mental status  Past Medical History:  Diagnosis Date  . Diabetes mellitus without complication ([place])   . Hypertension     Social History   Tobacco Use  . Smoking status: Current Every Day Smoker    Packs/day: 0.10    Types: Cigarettes  . Smokeless tobacco: Never Used  Substance Use Topics  . Alcohol use: Yes    Comment: occ.  . Drug use: No    Family History  Problem Relation Age of Onset  . Diabetes Maternal Grandmother     Allergies  Allergen Reactions  . Norco [Hydrocodone-Acetaminophen] Itching, Nausea And Vomiting and Swelling  Physical Exam: Constitutional: in no apparent distress  Vitals:   07/20/20 0800 07/20/20 1100  BP: (!) 178/72 (!) 161/82  Pulse: 90 91  Resp: 20 (!) 21  Temp:  97.9 F (36.6 C)  SpO2: 98% 96%   EYES: anicteric ENMT: right scalp incision c/d/i; +NGT Cardiovascular: Cor RRR Respiratory: clear; GI: soft, nt Musculoskeletal: no pedal edema noted Skin: negatives: no rash  Lab Results  Component Value Date   WBC 14.8 (H) 07/20/2020   HGB 8.8 (L) 07/20/2020   HCT 32.1 (L) 07/20/2020   MCV 78.5 (L) 07/20/2020   PLT 882 (H) 07/20/2020    Lab Results  Component Value Date   CREATININE 0.59 07/20/2020   BUN 6 07/20/2020   NA 139 07/20/2020   K 3.9 07/20/2020   CL 105  07/20/2020   CO2 25 07/20/2020    Lab Results  Component Value Date   ALT 14 07/04/2020   AST 23 07/04/2020   ALKPHOS 84 07/04/2020     Microbiology: Recent Results (from the past 240 hour(s))  Expectorated Sputum Assessment w Gram Stain, Rflx to Resp Cult     Status: None   Collection Time: 07/17/20 10:13 PM   Specimen: Expectorated Sputum  Result Value Ref Range Status   Specimen Description EXPECTORATED SPUTUM  Final   Special Requests NONE  Final   Sputum evaluation   Final    Sputum specimen not acceptable for testing.  Please recollect.   RESULT CALLED TO, READ BACK BY AND VERIFIED WITH: K TARPLEY RN 07/18/20 0449 JDW Performed at [facility], [address] [place], [place] [address]    Report Status 07/18/2020 FINAL  Final  Culture, blood (routine x 2)     Status: None (Preliminary result)   Collection Time: 07/18/20  3:50 PM   Specimen: BLOOD LEFT HAND  Result Value Ref Range Status   Specimen Description BLOOD LEFT HAND  Final   Special Requests   Final    BOTTLES DRAWN AEROBIC ONLY Blood Culture adequate volume   Culture   Final    NO GROWTH 2 DAYS Performed at [facility], [address] [place], [place] [address]    Report Status PENDING  Incomplete  Culture, blood (routine x 2)     Status: None (Preliminary result)   Collection Time: 07/18/20  3:57 PM   Specimen: BLOOD LEFT WRIST  Result Value Ref Range Status   Specimen Description BLOOD LEFT WRIST  Final   Special Requests   Final    BOTTLES DRAWN AEROBIC ONLY Blood Culture adequate volume   Culture   Final    NO GROWTH 2 DAYS Performed at [facility], [address] [place], [place] [address]    Report Status PENDING  Incomplete     W , MD [email or web address] 07/20/2020, 2:52 PM 

## 2020-07-20 NOTE — Procedures (Signed)
Cortrak  Person Inserting Tube:  Osa Craver, RD Tube Type:  Cortrak - 43 inches Tube Location:  Left nare Initial Placement:  Stomach Secured by: Bridle Technique Used to Measure Tube Placement:  Documented cm marking at nare/ corner of mouth Cortrak Secured At:  63 cm   Cortrak Tube Team Note:  Consult received to place a Cortrak feeding tube.   No x-ray is required. RN may begin using tube.   If the tube becomes dislodged please keep the tube and contact the Cortrak team at [email or web address] (password [email or web address]) for replacement.  If after hours and replacement cannot be delayed, place a NG tube and confirm placement with an abdominal x-ray.   Romelle Starcher MS, RDN, LDN, CNSC Registered Dietitian III Clinical Nutrition RD Pager and On-Call Pager Number Located in [place]

## 2020-07-20 NOTE — Plan of Care (Signed)
  Problem: Education: Goal: Knowledge of General Education information will improve Description: Including pain rating scale, medication(s)/side effects and non-pharmacologic comfort measures Outcome: Progressing   Problem: Health Behavior/Discharge Planning: Goal: Ability to manage health-related needs will improve Outcome: Progressing   Problem: Clinical Measurements: Goal: Ability to maintain clinical measurements within normal limits will improve Outcome: Progressing Goal: Will remain free from infection Outcome: Progressing Goal: Diagnostic test results will improve Outcome: Progressing Goal: Respiratory complications will improve Outcome: Progressing Goal: Cardiovascular complication will be avoided Outcome: Progressing   Problem: Activity: Goal: Risk for activity intolerance will decrease Outcome: Progressing   Problem: Nutrition: Goal: Adequate nutrition will be maintained Outcome: Progressing   Problem: Coping: Goal: Level of anxiety will decrease Outcome: Progressing   Problem: Elimination: Goal: Will not experience complications related to bowel motility Outcome: Progressing Goal: Will not experience complications related to urinary retention Outcome: Progressing   Problem: Pain Managment: Goal: General experience of comfort will improve Outcome: Progressing   Problem: Safety: Goal: Ability to remain free from injury will improve Outcome: Progressing   Problem: Skin Integrity: Goal: Risk for impaired skin integrity will decrease Outcome: Progressing   Problem: Education: Goal: Knowledge of disease or condition will improve Outcome: Progressing Goal: Knowledge of secondary prevention will improve Outcome: Progressing Goal: Knowledge of patient specific risk factors addressed and post discharge goals established will improve Outcome: Progressing Goal: Individualized Educational Video(s) Outcome: Progressing   Problem: Coping: Goal: Will verbalize  positive feelings about self Outcome: Progressing Goal: Will identify appropriate support needs Outcome: Progressing   Problem: Health Behavior/Discharge Planning: Goal: Ability to manage health-related needs will improve Outcome: Progressing   Problem: Self-Care: Goal: Ability to participate in self-care as condition permits will improve Outcome: Progressing Goal: Verbalization of feelings and concerns over difficulty with self-care will improve Outcome: Progressing Goal: Ability to communicate needs accurately will improve Outcome: Progressing   Problem: Nutrition: Goal: Risk of aspiration will decrease Outcome: Progressing Goal: Dietary intake will improve Outcome: Progressing   Problem: Ischemic Stroke/TIA Tissue Perfusion: Goal: Complications of ischemic stroke/TIA will be minimized Outcome: Progressing   Problem: Safety: Goal: Non-violent Restraint(s) Outcome: Progressing   

## 2020-07-20 NOTE — Progress Notes (Addendum)
PROGRESS NOTE        PATIENT DETAILS Name: Brandi Myers Age: 35 y.o. Sex: female Date of Birth: 23-Sep-1985 Admit Date: 07/04/2020 Admitting Physician Kerney Elbe, MD [id number], [place]   Referring service: Stroke team/neurology  Reason for consultation: Tachycardia/leukocytosis  Brief Narrative: Brandi Myers is an 35 y.o. 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  Patient was given TPA-subsequently evaluated by IR-and underwent endovascular revascularization of the occluded right ICA, MCA and right ACA on 3/24.  Patient remains intubated post revascularization and remains in the ICU.Subsequently-patient developed hemorrhagic transformation with significant amount of cerebral cytotoxic edema requiring decompressive hemicraniectomy, she was also found to have acute right upper extremity ischemia due to occluded brachial artery-and underwent right upper extremity thrombectomy.  While in the [place] did have fever that was attributed to MSSA/strep pneumonia and completed a 7-day course of ceftriaxone on 4/2.  Patient was subsequently stabilized-and transferred to the progressive care unit-over the past few days-she has developed persistent leukocytosis and tachycardia-hospitalist service was then consulted for further evaluation and recommendations.  Significant events: 3/23>> cerebral angiogram-endovascular revascularization of occluded right ICA supraclinoid segment, right MCA and right ACA 3/24>> right upper extremity thrombectomy including the right radial/ulnar and brachial artery 3/24>> fever 101F on 3/24 while in the ICU-intubated 3/25>> right craniectomy with placement of bone flap in the abdomen 3/23-3/31>>  ETT  Microbiology 3/25>> blood culture: Negative 3/26>> sputum culture: MSSA/strep pneumo 4/6>> blood culture: No growth  Antibiotics: Ancef: 3/23>> 3/25 Vancomycin: 3/27 x 1 Rocephin: 3/27>> 4/2  DVT Prophylaxis : IV Heparin  Subjective: No major issues overnight.  Afebrile-continues have some pain in her right hand.  Assessment/Plan: Tachycardia/leukocytosis: Suspect that this is multifactorial in etiology-but predominantly due to ischemic right hand-she still has pain in the right hand with eschar.  Some concern for mild hyperthyroidism-however most recent TSH/free T4 within normal limits.   Her initial blood cultures on 3/25 are negative-but patient had received several doses of Ancef preoperatively before these blood cultures were collected.  While in the ICU-she did have a fever that was attributed to MSSA/Streptococcus pneumonia-however her chest x-ray was not very particularly impressive.  She completed a week's course of IV Rocephin on 4/2.  Repeat blood culture on 4/6 neg.  She is afebrile. Given the fact that she has UDS positive cocaine-and 2 separate embolic events-concerned that she could have had endocarditis-that has been partially treated with antimicrobial therapy.  Thankfully her TEE is negative.  Given persistent leukocytosis-I have reached out to infectious disease to see if she needs empiric treatment with antimicrobial therapy.  Tachycardia is much better after starting beta-blockers.  Please follow further recommendations from infectious disease.  Mild hyperthyroidism: Unclear whether this is mild hyperthyroidism or sick euthyroid syndrome.  Suggest not rechecking any more thyroid function tests until she is more stable-and several weeks out.  Continue beta-blocker.  Given diagnostic uncertainty-would not recommend treatment with antithyroid medications.  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  Management per neurology and neurosurgery.  Acute right upper extremity ischemia: Vascular surgery following-maintained on IV heparin.  Per vascular surgery-transition to novel  anticoagulant when closer to discharge.  Dysphagia: Due to CVA-NG tube feedings ongoing.  MSSA/strep pneumonia: Completed course of antimicrobial therapy.  HLD: On statin  DM-2 (A1c 10.6 on 3/23): stable on Lantus 35 units twice daily, 6 units of NovoLog every 4 hours and SSI.  Recent Labs    07/20/20 0333 07/20/20 0802 07/20/20 1138  GLUCAP 95 141* 115*     Discussed with primary service-Dr. Blondell Reveal will sign off-please reconsult hospitalist service as needed.  Diet: Diet Order    None       Code Status: Full code   Family Communication: Defer to primary service  Disposition Plan: Defer to primary service   Antimicrobial agents: Anti-infectives (From admission, onward)   Start     Dose/Rate Route Frequency Ordered Stop   07/08/20 0900  cefTRIAXone (ROCEPHIN) 2 g in sodium chloride 0.9 % 100 mL IVPB        2 g 200 mL/hr over 30 Minutes Intravenous Every 24 hours 07/08/20 0801 07/14/20 1132   07/08/20 0900  vancomycin (VANCOCIN) 1,750 mg in sodium chloride 0.9 % 500 mL IVPB        1,750 mg 250 mL/hr over 120 Minutes Intravenous  Once 07/08/20 0802 07/08/20 1149   07/06/20 2000  ceFAZolin (ANCEF) IVPB 1 g/50 mL premix        1 g 100 mL/hr over 30 Minutes Intravenous Every 8 hours 07/06/20 1400 07/07/20 0508   07/06/20 0945  ceFAZolin (ANCEF) IVPB 2g/100 mL premix        2 g 200 mL/hr over 30 Minutes Intravenous To Surgery 07/06/20 0855 07/07/20 0945   07/04/20 1541  ceFAZolin (ANCEF) 2-4 GM/100ML-% IVPB       Note to Pharmacy: Desiree Hane   : cabinet override      07/04/20 1541 07/05/20 0344       Time spent: 25- minutes-Greater than 50% of this time was spent in counseling, explanation of  diagnosis, planning of further management, and coordination of care.  MEDICATIONS: Scheduled Meds: . atorvastatin  40 mg Per Tube q1800  . bisacodyl  10 mg Rectal Once  . chlorhexidine gluconate (MEDLINE KIT)  15 mL Mouth Rinse BID  . Chlorhexidine Gluconate Cloth  6 each Topical Daily  . docusate  100 mg Per Tube BID  . ferrous sulfate  325 mg Oral BID WC  . gabapentin  600 mg Per Tube BID  . insulin aspart  0-15 Units Subcutaneous Q4H  . insulin aspart  6 Units Subcutaneous Q4H  . insulin detemir  35 Units Subcutaneous BID  . levETIRAcetam  500 mg Per Tube BID  . mouth rinse  15 mL Mouth Rinse q12n4p  . metoprolol tartrate  25 mg Oral BID  . multivitamin with minerals  1 tablet Per Tube Daily  . polyethylene glycol  17 g Per Tube Daily  . sodium chloride flush  10-40 mL Intracatheter Q12H   Continuous Infusions: . sodium chloride Stopped (07/13/20 1028)  . dextrose 5 % and 0.9% NaCl 75 mL/hr at 07/19/20 1828  . feeding supplement (GLUCERNA 1.5 CAL) Stopped (07/19/20 0700)  . heparin 2,050 Units/hr (07/20/20 1255)   PRN Meds:.sodium chloride, acetaminophen **OR** acetaminophen (TYLENOL) oral liquid 160 mg/5 mL **OR** acetaminophen, bisacodyl, labetalol, ondansetron **OR** ondansetron (ZOFRAN) IV, senna-docusate   PHYSICAL EXAM: Vital signs: Vitals:   07/20/20 0330 07/20/20 0700 07/20/20 0800 07/20/20 1100  BP: (!) 151/81 (!) 176/75 (!) 178/72 (!) 161/82  Pulse: 98 100 90 91  Resp: 18 (!)  21 20 (!) 21  Temp: 98 F (36.7 C)   97.9 F (36.6 C)  TempSrc: Oral   Axillary  SpO2: 97% 97% 98% 96%  Weight:      Height:       Filed Weights   07/15/20 0400 07/16/20 0400 07/19/20 0450  Weight: 71.3 kg 70.5 kg 72.5 kg   Body mass index is 26.6 kg/m.   Gen Exam:Alert awake-not in any distress HEENT:atraumatic, normocephalic Chest: B/L clear to auscultation anteriorly CVS:S1S2 regular Abdomen:soft non tender, non distended Extremities:no edema Neurology: Left  hemineglect-left hemiplegia Skin: no rash  I have personally reviewed following labs and imaging studies  LABORATORY DATA: CBC: Recent Labs  Lab 07/15/20 0409 07/16/20 0654 07/17/20 0004 07/18/20 0428 07/20/20 0405  WBC 16.8* 18.6* 21.5* 19.6* 14.8*  HGB 9.2* 9.7* 9.6* 10.0* 8.8*  HCT 33.6* 35.8* 35.5* 36.2 32.1*  MCV 76.9* 77.5* 78.7* 78.0* 78.5*  PLT 559* 608* 626* 739* 882*    Basic Metabolic Panel: Recent Labs  Lab 07/15/20 0409 07/16/20 0654 07/17/20 0004 07/18/20 0428 07/20/20 0405  NA 140 139 138 137 139  K 3.8 3.9 4.0 4.1 3.9  CL 105 104 101 100 105  CO2 26 26 24 25 25   GLUCOSE 134* 127* 155* 140* 119*  BUN 8 12 11 10 6   CREATININE 0.47 0.62 0.57 0.66 0.59  CALCIUM 9.2 9.2 9.3 9.5 9.1    GFR: Estimated Creatinine Clearance: 97.9 mL/min (by C-G formula based on SCr of 0.59 mg/dL).  Liver Function Tests: No results for input(s): AST, ALT, ALKPHOS, BILITOT, PROT, ALBUMIN in the last 168 hours. No results for input(s): LIPASE, AMYLASE in the last 168 hours. No results for input(s): AMMONIA in the last 168 hours.  Coagulation Profile: No results for input(s): INR, PROTIME in the last 168 hours.  Cardiac Enzymes: No results for input(s): CKTOTAL, CKMB, CKMBINDEX, TROPONINI in the last 168 hours.  BNP (last 3 results) No results for input(s): PROBNP in the last 8760 hours.  Lipid Profile: No results for input(s): CHOL, HDL, LDLCALC, TRIG, CHOLHDL, LDLDIRECT in the last 72 hours.  Thyroid Function Tests: Recent Labs    07/20/20 0405  TSH 1.001  FREET4 1.00    Anemia Panel: Recent Labs    07/20/20 0405  FERRITIN 25  TIBC 449  IRON 21*    Urine analysis:    Component Value Date/Time   COLORURINE YELLOW 07/18/2020 1740   APPEARANCEUR HAZY (A) 07/18/2020 1740   LABSPEC 1.025 07/18/2020 1740   PHURINE 5.0 07/18/2020 1740   GLUCOSEU NEGATIVE 07/18/2020 1740   HGBUR NEGATIVE 07/18/2020 1740   BILIRUBINUR NEGATIVE 07/18/2020 1740    KETONESUR 80 (A) 07/18/2020 1740   PROTEINUR NEGATIVE 07/18/2020 1740   NITRITE NEGATIVE 07/18/2020 1740   LEUKOCYTESUR NEGATIVE 07/18/2020 1740    Sepsis Labs: Lactic Acid, Venous    Component Value Date/Time   LATICACIDVEN 1.1 07/16/2020 0904    MICROBIOLOGY: Recent Results (from the past 240 hour(s))  Expectorated Sputum Assessment w Gram Stain, Rflx to Resp Cult     Status: None   Collection Time: 07/17/20 10:13 PM   Specimen: Expectorated Sputum  Result Value Ref Range Status   Specimen Description EXPECTORATED SPUTUM  Final   Special Requests NONE  Final   Sputum evaluation   Final    Sputum specimen not acceptable for testing.  Please recollect.   RESULT CALLED TO, READ BACK BY AND VERIFIED WITH: Belinda Fisher RN 07/18/20 0449 JDW Performed at [facility], [place] [address] [place], [place] [address]    Report Status 07/18/2020 FINAL  Final  Culture, blood (routine x 2)     Status: None (Preliminary result)   Collection Time: 07/18/20  3:50 PM   Specimen: BLOOD LEFT HAND  Result Value Ref Range Status   Specimen Description BLOOD LEFT HAND  Final   Special Requests   Final    BOTTLES DRAWN AEROBIC ONLY Blood Culture adequate volume   Culture   Final    NO GROWTH 2 DAYS Performed at [facility], [place] [address], [place], [place] [address]    Report Status PENDING  Incomplete  Culture, blood (routine x 2)     Status: None (Preliminary result)   Collection Time: 07/18/20  3:57 PM   Specimen: BLOOD LEFT WRIST  Result Value Ref Range Status   Specimen Description BLOOD LEFT WRIST  Final   Special Requests   Final    BOTTLES DRAWN AEROBIC ONLY Blood Culture adequate volume   Culture   Final    NO GROWTH 2 DAYS Performed at [facility], [place] [address], [place], [place] [address]    Report Status PENDING  Incomplete    RADIOLOGY STUDIES/RESULTS: DG Swallowing Func-Speech Pathology  Result Date: 07/20/2020 Objective Swallowing Evaluation: Type of  Study: MBS-Modified Barium Swallow Study  Patient Details Name: LETZY GULLICKSON MRN: [id number] Date of Birth: 10-Feb-1986 Today's Date: 07/20/2020 Time: SLP Start Time (ACUTE ONLY): 5102 -SLP Stop Time (ACUTE ONLY): 0856 SLP Time Calculation (min) (ACUTE ONLY): 18 min Past Medical History: Past Medical History: Diagnosis Date . Diabetes mellitus without complication ([place])  . Hypertension  Past Surgical History: Past Surgical History: Procedure Laterality Date . CESAREAN SECTION   . CHOLECYSTECTOMY   . CRANIOTOMY Right 07/06/2020  Procedure: RIGHT CRANIECTOMY WITH PLACEMENT OF BONE FLAP IN ABDOMEN;  Surgeon: Erline Levine, MD;  Location: [facility];  Service: Neurosurgery;  Laterality: Right; . IR ANGIO INTRA EXTRACRAN SEL COM CAROTID INNOMINATE UNI L MOD SED  07/04/2020 . IR ANGIO VERTEBRAL SEL VERTEBRAL UNI R MOD SED  07/04/2020 . IR CT HEAD LTD  07/04/2020 . IR CT HEAD LTD  07/04/2020 . IR PERCUTANEOUS ART THROMBECTOMY/INFUSION INTRACRANIAL INC DIAG ANGIO  07/04/2020 . RADIOLOGY WITH ANESTHESIA N/A 07/04/2020  Procedure: IR WITH ANESTHESIA;  Surgeon: Radiologist, Medication, MD;  Location: [facility];  Service: Radiology;  Laterality: N/A; . THROMBECTOMY BRACHIAL ARTERY Right 07/05/2020  Procedure: RIGHT UPPER EXTREMITY THROMBECTOMY;  Surgeon: Marty Heck, MD;  Location[facility];  Service: Vascular;  Laterality: Right; HPI: 35 yo female admitted with L sided weakness, given TPA for R MCA involving basal ganglia, frontal, temporal lobes  3/23 s/p BIL revascularization  R MCA ACA achieving 2B revascularization. Pt intubated 3/23-31. 3/24 R UE thrombectomy. CT revealed worsening midline shift. 3/25 decompressive hemicranitectomy, R side bone flap in abdomen. PMH current smoker uncontrolled DM HTn  Subjective: -- (adequately awake) Assessment / Plan / Recommendation CHL IP CLINICAL IMPRESSIONS 07/20/2020 Clinical Impression Although Norell was adequately alert for MBS, protective measures for airway closure were late allowing barium  to enter vestibule without exiting. Nectar and honey thick aggregated in pyriform sinuses for 6-9 seconds and fell onto vocal cords during the swallow. There was sensation as evidenced by strong cough with incomplete expulsion. Leyla's cognitive impairments, primarily decreased ability to attend to bolus transfer exacerbated timing and coordination. Of note, pt's closure and coordination were improved with two trials/swallows avoiding penetration. Viscocity of pudding  allowed laryngeal closure to occur prior to proluplsion of bolus into hypopharynx and did not enter vstibule. Compliance of strategy cannot be relied upon at this point and recommend re-placement of alternative feeding tube. Therapist intervention will target trials of pudding and increase attention, transit and swallow initiation and hopefully can avoid longer term means of nutrition however, uncertain at this time. SLP Visit Diagnosis Dysphagia, oropharyngeal phase (R13.12) Attention and concentration deficit following -- Frontal lobe and executive function deficit following -- Impact on safety and function Severe aspiration risk   CHL IP TREATMENT RECOMMENDATION 07/20/2020 Treatment Recommendations Therapy as outlined in treatment plan below   Prognosis 07/20/2020 Prognosis for Safe Diet Advancement Good Barriers to Reach Goals Severity of deficits Barriers/Prognosis Comment -- CHL IP DIET RECOMMENDATION 07/20/2020 SLP Diet Recommendations NPO Liquid Administration via -- Medication Administration Via alternative means Compensations -- Postural Changes --   CHL IP OTHER RECOMMENDATIONS 07/20/2020 Recommended Consults -- Oral Care Recommendations Oral care QID Other Recommendations --   CHL IP FOLLOW UP RECOMMENDATIONS 07/20/2020 Follow up Recommendations Inpatient Rehab   CHL IP FREQUENCY AND DURATION 07/20/2020 Speech Therapy Frequency (ACUTE ONLY) min 2x/week Treatment Duration 2 weeks      CHL IP ORAL PHASE 07/20/2020 Oral Phase Impaired Oral - Pudding  Teaspoon -- Oral - Pudding Cup -- Oral - Honey Teaspoon -- Oral - Honey Cup Weak lingual manipulation;Reduced posterior propulsion;Delayed oral transit;Decreased bolus cohesion Oral - Nectar Teaspoon -- Oral - Nectar Cup Delayed oral transit;Weak lingual manipulation;Reduced posterior propulsion Oral - Nectar Straw -- Oral - Thin Teaspoon -- Oral - Thin Cup -- Oral - Thin Straw -- Oral - Puree Delayed oral transit Oral - Mech Soft -- Oral - Regular -- Oral - Multi-Consistency -- Oral - Pill -- Oral Phase - Comment --  CHL IP PHARYNGEAL PHASE 07/20/2020 Pharyngeal Phase Impaired Pharyngeal- Pudding Teaspoon -- Pharyngeal -- Pharyngeal- Pudding Cup -- Pharyngeal -- Pharyngeal- Honey Teaspoon -- Pharyngeal -- Pharyngeal- Honey Cup Delayed swallow initiation-pyriform sinuses;Penetration/Aspiration before swallow Pharyngeal Material enters airway, CONTACTS cords and not ejected out Pharyngeal- Nectar Teaspoon -- Pharyngeal -- Pharyngeal- Nectar Cup Delayed swallow initiation-pyriform sinuses;Penetration/Aspiration during swallow Pharyngeal -- Pharyngeal- Nectar Straw -- Pharyngeal -- Pharyngeal- Thin Teaspoon -- Pharyngeal -- Pharyngeal- Thin Cup -- Pharyngeal -- Pharyngeal- Thin Straw -- Pharyngeal -- Pharyngeal- Puree Delayed swallow initiation-vallecula Pharyngeal -- Pharyngeal- Mechanical Soft -- Pharyngeal -- Pharyngeal- Regular -- Pharyngeal -- Pharyngeal- Multi-consistency -- Pharyngeal -- Pharyngeal- Pill -- Pharyngeal -- Pharyngeal Comment --  CHL IP CERVICAL ESOPHAGEAL PHASE 07/20/2020 Cervical Esophageal Phase WFL Pudding Teaspoon -- Pudding Cup -- Honey Teaspoon -- Honey Cup -- Nectar Teaspoon -- Nectar Cup -- Nectar Straw -- Thin Teaspoon -- Thin Cup -- Thin Straw -- Puree -- Mechanical Soft -- Regular -- Multi-consistency -- Pill -- Cervical Esophageal Comment -- [place] Siren 07/20/2020, 11:36 AM  Orbie Pyo Colvin Caroli.Ed Actor Pager [phone] Office [phone]              ECHO TEE  Result Date: 07/19/2020    TRANSESOPHOGEAL ECHO REPORT   Patient Name:   MERAL GEISSINGER Floyd Date of Exam: 07/19/2020 Medical Rec #:  [id number]       Height:       65.0 in Accession #:    [id number]      Weight:       159.8 lb Date of Birth:  January 27, 1986      BSA:          1.798 m Patient  Age:    63 years        BP:           134/87 mmHg Patient Gender: F               HR:           101 bpm. Exam Location:  Inpatient Procedure: Transesophageal Echo, 3D Echo, Cardiac Doppler, Color Doppler and            Saline Contrast Bubble Study Indications:    Stroke  History:        Patient has prior history of Echocardiogram examinations, most                 recent 07/05/2020. Risk Factors:Hypertension and Diabetes.  Sonographer:    Bernadene Person RDCS Referring Phys: [id number] [place]: After discussion of the risks and benefits of a TEE, an informed consent was obtained. The transesophogeal probe was passed without difficulty through the esophogus of the patient. Sedation performed by different physician. The patient was monitored while under deep sedation. Anesthestetic sedation was provided intravenously by Anesthesiology: 206.46m of Propofol, 869mof Lidocaine. The patient's vital signs; including heart rate, blood pressure, and oxygen saturation; remained stable throughout the procedure. The patient developed no complications during the procedure. IMPRESSIONS  1. Left ventricular ejection fraction, by estimation, is 50 to 55%. The left ventricle has low normal function.  2. Right ventricular systolic function is normal. The right ventricular size is normal.  3. No left atrial/left atrial appendage thrombus was detected.  4. The mitral valve is normal in structure. No evidence of mitral valve regurgitation.  5. The aortic valve is tricuspid. Aortic valve regurgitation is trivial. No aortic stenosis is present.  6. Agitated saline contrast bubble study was negative, with no evidence of any  interatrial shunt. FINDINGS  Left Ventricle: Left ventricular ejection fraction, by estimation, is 50 to 55%. The left ventricle has low normal function. The left ventricular internal cavity size was normal in size. Right Ventricle: The right ventricular size is normal. No increase in right ventricular wall thickness. Right ventricular systolic function is normal. Left Atrium: Left atrial size was normal in size. No left atrial/left atrial appendage thrombus was detected. Right Atrium: Right atrial size was normal in size. Pericardium: There is no evidence of pericardial effusion. Mitral Valve: The mitral valve is normal in structure. No evidence of mitral valve regurgitation. Tricuspid Valve: The tricuspid valve is normal in structure. Tricuspid valve regurgitation is trivial. Aortic Valve: The aortic valve is tricuspid. Aortic valve regurgitation is trivial. No aortic stenosis is present. Pulmonic Valve: The pulmonic valve was grossly normal. Pulmonic valve regurgitation is not visualized. Aorta: The aortic root is normal in size and structure. IAS/Shunts: No atrial level shunt detected by color flow Doppler. Agitated saline contrast was given intravenously to evaluate for intracardiac shunting. Agitated saline contrast bubble study was negative, with no evidence of any interatrial shunt. ChOswaldo MilianD Electronically signed by ChOswaldo MilianD Signature Date/Time: 07/19/2020/5:22:36 PM    Final      LOS: [id number]   ShOren BinetMD  [facility]    To contact the attending provider between 7A-7P or the covering provider during after hours 7P-7A, please log into the web site [email or web address] and access using universal [place] password for that web site. If you do not have the password, please call the hospital operator.  07/20/2020, 2:19 PM

## 2020-07-20 NOTE — Progress Notes (Incomplete)
Subjective: Patient reports that she is lethargic but is following commands.   Objective: Vital signs in last 24 hours: Temp:  [97.3 F (36.3 C)-98.2 F (36.8 C)] 98 F (36.7 C) (04/08 0330) Pulse Rate:  [90-101] 90 (04/08 0800) Resp:  [17-22] 20 (04/08 0800) BP: (123-178)/(72-105) 178/72 (04/08 0800) SpO2:  [93 %-100 %] 98 % (04/08 0800)  Intake/Output from previous day: 04/07 0701 - 04/08 0700 In: 200 [I.V.:200] Out: 0  Intake/Output this shift: No intake/output data recorded.  Physical Exam: Patient is arousable to voice and is O X 3. She has a poor attention span and is not actively engaged in my questions and examination. She follws simple commands on the right with spontaneous movement against gravity. She has slight withdrawal to noxious stimuli in her LUE and LLE. She has severe left-sided neglect. She appears to be moderately dysarthric. No aphasia was appreciated. Right gaze preference. PERRL. Craniectomy and abdominal incisions arewell approximated with no drainage, erythema, or edema.    Lab Results: Recent Labs    07/18/20 0428 07/20/20 0405  WBC 19.6* 14.8*  HGB 10.0* 8.8*  HCT 36.2 32.1*  PLT 739* 882*   BMET Recent Labs    07/18/20 0428 07/20/20 0405  NA 137 139  K 4.1 3.9  CL 100 105  CO2 25 25  GLUCOSE 140* 119*  BUN 10 6  CREATININE 0.66 0.59  CALCIUM 9.5 9.1    Studies/Results: DG CHEST PORT 1 VIEW  Result Date: 07/18/2020 CLINICAL DATA:  Feeding tube dysfunction. EXAM: PORTABLE CHEST 1 VIEW COMPARISON:  07/16/2020 FINDINGS: Single view of the chest demonstrates a feeding tube that extends into the abdomen. The tube extends beyond the image. Both lungs are clear. Heart size is normal. Negative for a pneumothorax. IMPRESSION: 1. No acute chest abnormality. 2. Feeding tube extends into the abdomen but the tip is beyond the image. Electronically Signed   By: Richarda Overlie M.D.   On: 07/18/2020 10:52   DG Abd Portable 1V  Result Date:  07/18/2020 CLINICAL DATA:  Feeding tube dysfunction, nurse reports choking EXAM: PORTABLE ABDOMEN - 1 VIEW COMPARISON:  None. FINDINGS: Two partial images of the abdomen are submitted for review, one of two demonstrating non weighted enteric feeding tube tip projecting over the gastric fundus. A substantial portion of the right hemiabdomen is excluded from images submitted for review. Nonobstructive pattern of bowel gas. IMPRESSION: Two partial images of the abdomen are submitted for review, one of two demonstrating non weighted enteric feeding tube tip projecting over the gastric fundus. Electronically Signed   By: Lauralyn Primes M.D.   On: 07/18/2020 10:52   ECHO TEE  Result Date: 07/19/2020    TRANSESOPHOGEAL ECHO REPORT   Patient Name:   Brandi Myers Date of Exam: 07/19/2020 Medical Rec #:  [id number]       Height:       65.0 in Accession #:    [id number]      Weight:       159.8 lb Date of Birth:  1985-06-12      BSA:          1.798 m Patient Age:    34 years        BP:           134/87 mmHg Patient Gender: F               HR:           101 bpm. Exam Location:  Inpatient Procedure: Transesophageal Echo, 3D Echo, Cardiac Doppler, Color Doppler and            Saline Contrast Bubble Study Indications:    Stroke  History:        Patient has prior history of Echocardiogram examinations, most                 recent 07/05/2020. Risk Factors:Hypertension and Diabetes.  Sonographer:    Eulah Pont RDCS Referring Phys: [id number] Little Ishikawa PROCEDURE: After discussion of the risks and benefits of a TEE, an informed consent was obtained. The transesophogeal probe was passed without difficulty through the esophogus of the patient. Sedation performed by different physician. The patient was monitored while under deep sedation. Anesthestetic sedation was provided intravenously by Anesthesiology: 206.25mg  of Propofol, 80mg  of Lidocaine. The patient's vital signs; including heart rate, blood pressure, and oxygen  saturation; remained stable throughout the procedure. The patient developed no complications during the procedure. IMPRESSIONS  1. Left ventricular ejection fraction, by estimation, is 50 to 55%. The left ventricle has low normal function.  2. Right ventricular systolic function is normal. The right ventricular size is normal.  3. No left atrial/left atrial appendage thrombus was detected.  4. The mitral valve is normal in structure. No evidence of mitral valve regurgitation.  5. The aortic valve is tricuspid. Aortic valve regurgitation is trivial. No aortic stenosis is present.  6. Agitated saline contrast bubble study was negative, with no evidence of any interatrial shunt. FINDINGS  Left Ventricle: Left ventricular ejection fraction, by estimation, is 50 to 55%. The left ventricle has low normal function. The left ventricular internal cavity size was normal in size. Right Ventricle: The right ventricular size is normal. No increase in right ventricular wall thickness. Right ventricular systolic function is normal. Left Atrium: Left atrial size was normal in size. No left atrial/left atrial appendage thrombus was detected. Right Atrium: Right atrial size was normal in size. Pericardium: There is no evidence of pericardial effusion. Mitral Valve: The mitral valve is normal in structure. No evidence of mitral valve regurgitation. Tricuspid Valve: The tricuspid valve is normal in structure. Tricuspid valve regurgitation is trivial. Aortic Valve: The aortic valve is tricuspid. Aortic valve regurgitation is trivial. No aortic stenosis is present. Pulmonic Valve: The pulmonic valve was grossly normal. Pulmonic valve regurgitation is not visualized. Aorta: The aortic root is normal in size and structure. IAS/Shunts: No atrial level shunt detected by color flow Doppler. Agitated saline contrast was given intravenously to evaluate for intracardiac shunting. Agitated saline contrast bubble study was negative, with no  evidence of any interatrial shunt. MD Electronically signed by MD Signature Date/Time: 07/19/2020/5:22:36 PM    Final     Assessment/Plan: Patient continues to be stable.   Awaiting SNF placement. She will need to follow up with neurosurgery as an outpatient. Will discuss future replacement of skull flap in follow up.   LOS: 16 days     [name], DNP, NP-C 07/20/2020, 9:01 AM

## 2020-07-20 NOTE — Progress Notes (Signed)
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 3/24 R UE thromectomy CT reveals worsening midline shift start 3% saline 3/25 decompressive hemicranitectomy R side bone flap in abdomen 3/25 fever 103. Extubated 3/31. PMH current smoker uncontrolled DM HTn   OT comments  Treatment focused on sitting balance, weight bearing through LUE, improving attention to left side and upper extremity, attempting to activate LUE and functional mobility. Co - treat with PT needed due to complexity of patient's deficits. Patient supine in bed with left upper extremity internally rotated and wrist flexed in an extensor synergy pattern with arm at her side and somewhat twisted underneath her. With initial grasping of patient's LUE to reposition and assess PROM and two other times with maneuvering LUE during treatment - patient exhibited activation of extensor pattern in elbow and wrist - though movement and increased tone not thought to be purposeful as can not be replicated on command. Patient does not attend to upper extremity and has to be cued to left side but only can maintain for a couple seconds. Patient total care for pericare at bed level after being found continent. She can assist with rolling. Patient max assist x 2 for supine to sit and total assist x 2 for sit to stands and return to supine. Patient does not attend to left side, follows commands inconsistently, perseverates on right hand pain. Weight bearing through LUE at edge of bed while working on sitting balance as well. Patient has sensation deficits - reports "no" when asked if she felt touch and did not response to painful stimuli on back  of arm. She did acknowledge discomfort when therapist tapped her hand - but patient appeared to see the stimuli. No purposeful active movement noted.  Did not significantly use RLE with sit to stand today and knee had to be blocked. Cognitive impairments effecting performance. Continue POC.  In regards to potential splinting - patient's fingers, wrist, and forearm  able to be ranged through full motion with slight tightness noted in wrist.  Agree that splinting not yet needed. However, patient's LUE should be positioned on pillows - for support and keep arm in safe position while assisting with keeping wrist in neutral and fingers extended.     Follow Up Recommendations  CIR    Equipment Recommendations  3 in 1 bedside commode;Wheelchair (measurements OT);Wheelchair cushion (measurements OT);Hospital bed    Recommendations for Other Services      Precautions / Restrictions Precautions Precautions: Fall Precaution Comments: bone flap out in R abdomen, L hemiparesis, cortrak, helmet with activity Restrictions Weight Bearing Restrictions: No       Mobility Bed Mobility Overal bed mobility: Needs Assistance Bed Mobility: Supine to Sit;Sit to Supine Rolling: Max assist   Supine to sit: Max assist;+2 for physical assistance Sit to supine: Total assist;+2 for safety/equipment;+2 for physical assistance   General bed mobility comments: Patient able to assist with rolling, requiring max assist for completion of trunk roll and sequencing step-by-step. Max +2 for supine>sit for trunk and LE management, pt using RUE to assist. Total assist +2 for return to supine with multiple boosts required to return pt to good positioning.    Transfers Overall transfer level: Needs  assistance Equipment used: 2 person hand held assist Transfers: Sit to/from Stand Sit to Stand: Total assist;+2 physical assistance;+2 safety/equipment         General transfer comment: Performed sit to stand x 3 - all  three stands total assist x 2. Patient not significantly assisting with RLE - both knees blocked, hand placements on pelvis and sternum to keep patient upright. Patient's cognitive impairments and inattention limiting activity. Patient perseverates on right hand.    Balance Overall balance assessment: Needs assistance Sitting-balance support: Single extremity supported;Feet supported Sitting balance-Leahy Scale: Zero Sitting balance - Comments: mod- max assistance to maintain balance. Attempts to lean patient on right elbow. Patient unable to assist with correcting balance. Postural control: Left lateral lean Standing balance support: During functional activity Standing balance-Leahy Scale: Zero Standing balance comment: total +2                           ADL either performed or assessed with clinical judgement   ADL Overall ADL's : Needs assistance/impaired Eating/Feeding: NPO                           Toileting- Clothing Manipulation and Hygiene: Bed level;+2 for physical assistance;+2 for safety/equipment;Total assistance Toileting - Clothing Manipulation Details (indicate cue type and reason): With attempts to get patient to assist with pericare - patient held cloth and placed near area but distracted by sore on right palm that is painful. Therefore patient continues to be total care for toileting.     Functional mobility during ADLs: Total assistance;+2 for physical assistance;+2 for safety/equipment       Vision   Vision Assessment?: Vision impaired- to be further tested in functional context Additional Comments: Left sided inattention. Can get patient to track to the left and attend, approx 2-3 seconds, to the left before returning to look to the right.   Perception     Praxis      Cognition Arousal/Alertness: Awake/alert Behavior During Therapy: Flat affect;Impulsive;Restless Overall Cognitive Status: Impaired/Different from baseline Area of  Impairment: Attention;Following commands;Problem solving;Awareness;Safety/judgement;Memory                   Current Attention Level: Focused Memory: Decreased short-term memory;Decreased recall of precautions Following Commands: Follows one step commands inconsistently Safety/Judgement: Decreased awareness of safety;Decreased awareness of deficits Awareness: Intellectual Problem Solving: Slow processing;Decreased initiation;Difficulty sequencing;Requires verbal cues;Requires tactile cues General Comments: Pt with poor awareness into her deficits, needing repeated multi-modal cues to attend to them and correct body position to ensure safety. Poor sequencing, poor attention span requiring frequent cues. Pt responds best if she is addressed by name prior to giving command/cue. Inattention to left.        Exercises Other Exercises Other Exercises: Bilateral CL reaching, x5 with PROM assist on L; to break up truncal rigidity and stiffness Other Exercises: Passive ROM to left wrist and hand, no significant tightness and able to fully ranged.   Shoulder Instructions       General Comments 1 finger sulcus sign LUE, improved with proprioceptive WB    Pertinent Vitals/ Pain       Pain Assessment: Faces Faces Pain Scale: Hurts little more Pain Location: R wrist/hand Pain Descriptors / Indicators: Guarding;Other (Comment) (hyperfocus) Pain Intervention(s): Limited activity within patient's tolerance;Monitored during session;Repositioned  Home Living  Prior Functioning/Environment              Frequency  Min 2X/week        Progress Toward Goals  OT Goals(current goals can now be found in the care plan section)  Progress towards OT goals: Progressing toward goals  Acute Rehab OT Goals Patient Stated Goal: to get up and move OT Goal Formulation: Patient unable to participate in goal setting Potential to Achieve  Goals: Good  Plan Discharge plan remains appropriate    Co-evaluation    PT/OT/SLP Co-Evaluation/Treatment: Yes Reason for Co-Treatment: For patient/therapist safety;To address functional/ADL transfers;Necessary to address cognition/behavior during functional activity PT goals addressed during session: Mobility/safety with mobility;Balance;Strengthening/ROM OT goals addressed during session: Strengthening/ROM;ADL's and self-care (Neuro - Re-ed, functional mobility)      AM-PAC OT "6 Clicks" Daily Activity     Outcome Measure   Help from another person eating meals?: Total Help from another person taking care of personal grooming?: A Lot Help from another person toileting, which includes using toliet, bedpan, or urinal?: Total Help from another person bathing (including washing, rinsing, drying)?: Total Help from another person to put on and taking off regular upper body clothing?: Total Help from another person to put on and taking off regular lower body clothing?: Total 6 Click Score: 7    End of Session Equipment Utilized During Treatment: Gait belt  OT Visit Diagnosis: Unsteadiness on feet (R26.81);Other abnormalities of gait and mobility (R26.89);Muscle weakness (generalized) (M62.81);Hemiplegia and hemiparesis Hemiplegia - Right/Left: Left Hemiplegia - dominant/non-dominant: Non-Dominant Hemiplegia - caused by: Cerebral infarction   Activity Tolerance Patient tolerated treatment well   Patient Left in bed;with call bell/phone within reach;with bed alarm set   Nurse Communication Mobility status        Time: 5993-5701 OT Time Calculation (min): 42 min  Charges: OT General Charges $OT Visit: 1 Visit OT Treatments $Self Care/Home Management : 8-22 mins $Neuromuscular Re-education: 8-22 mins  Brandi Myers, OTR/L Acute Care Rehab Services  Office [phone] Pager: [phone]    Kelli Churn 07/20/2020, 4:41 PM

## 2020-07-20 NOTE — Progress Notes (Signed)
Patient attempting to roll out of the bed. Messaged MD to get lap belt restraint ordered. The lap belt is applied.

## 2020-07-20 NOTE — Progress Notes (Addendum)
Subjective: Patient reports "I need to get in my truck"    Objective: Vital signs in last 24 hours: Temp:  [97.3 F (36.3 C)-99 F (37.2 C)] 98 F (36.7 C) (04/08 0330) Pulse Rate:  [95-109] 98 (04/08 0330) Resp:  [17-22] 18 (04/08 0330) BP: (123-169)/(75-105) 151/81 (04/08 0330) SpO2:  [93 %-100 %] 97 % (04/08 0330)  Intake/Output from previous day: 04/07 0701 - 04/08 0700 In: 200 [I.V.:200] Out: 0  Intake/Output this shift: No intake/output data recorded.  Alert, states name, not place. Cooperates with exam and scalp staple removal. Scalp incision healing nicely, remains well apprximated with staple removal. No erythema, swelling or drainage. Abdomen incision with Dermabond. No erythema swelling or drainage. Neuro exam unchanged, tracks on right side, moving right side.  Lab Results: Recent Labs    07/18/20 0428 07/20/20 0405  WBC 19.6* 14.8*  HGB 10.0* 8.8*  HCT 36.2 32.1*  PLT 739* 882*   BMET Recent Labs    07/18/20 0428 07/20/20 0405  NA 137 139  K 4.1 3.9  CL 100 105  CO2 25 25  GLUCOSE 140* 119*  BUN 10 6  CREATININE 0.66 0.59  CALCIUM 9.5 9.1    Studies/Results: DG CHEST PORT 1 VIEW  Result Date: 07/18/2020 CLINICAL DATA:  Feeding tube dysfunction. EXAM: PORTABLE CHEST 1 VIEW COMPARISON:  07/16/2020 FINDINGS: Single view of the chest demonstrates a feeding tube that extends into the abdomen. The tube extends beyond the image. Both lungs are clear. Heart size is normal. Negative for a pneumothorax. IMPRESSION: 1. No acute chest abnormality. 2. Feeding tube extends into the abdomen but the tip is beyond the image. Electronically Signed   By: Richarda Overlie M.D.   On: 07/18/2020 10:52   DG Abd Portable 1V  Result Date: 07/18/2020 CLINICAL DATA:  Feeding tube dysfunction, nurse reports choking EXAM: PORTABLE ABDOMEN - 1 VIEW COMPARISON:  None. FINDINGS: Two partial images of the abdomen are submitted for review, one of two demonstrating non weighted enteric  feeding tube tip projecting over the gastric fundus. A substantial portion of the right hemiabdomen is excluded from images submitted for review. Nonobstructive pattern of bowel gas. IMPRESSION: Two partial images of the abdomen are submitted for review, one of two demonstrating non weighted enteric feeding tube tip projecting over the gastric fundus. Electronically Signed   By: Lauralyn Primes M.D.   On: 07/18/2020 10:52   ECHO TEE  Result Date: 07/19/2020    TRANSESOPHOGEAL ECHO REPORT   Patient Name:   Brandi Myers Johndrow Date of Exam: 07/19/2020 Medical Rec #:  [id number]       Height:       65.0 in Accession #:    [id number]      Weight:       159.8 lb Date of Birth:  1985-12-11      BSA:          1.798 m Patient Age:    35 years        BP:           134/87 mmHg Patient Gender: F               HR:           101 bpm. Exam Location:  Inpatient Procedure: Transesophageal Echo, 3D Echo, Cardiac Doppler, Color Doppler and            Saline Contrast Bubble Study Indications:    Stroke  History:  Patient has prior history of Echocardiogram examinations, most                 recent 07/05/2020. Risk Factors:Hypertension and Diabetes.  Sonographer:    Eulah Pont RDCS Referring Phys: [id number] Little Ishikawa PROCEDURE: After discussion of the risks and benefits of a TEE, an informed consent was obtained. The transesophogeal probe was passed without difficulty through the esophogus of the patient. Sedation performed by different physician. The patient was monitored while under deep sedation. Anesthestetic sedation was provided intravenously by Anesthesiology: 206.25mg  of Propofol, 80mg  of Lidocaine. The patient's vital signs; including heart rate, blood pressure, and oxygen saturation; remained stable throughout the procedure. The patient developed no complications during the procedure. IMPRESSIONS  1. Left ventricular ejection fraction, by estimation, is 50 to 55%. The left ventricle has low normal function.  2.  Right ventricular systolic function is normal. The right ventricular size is normal.  3. No left atrial/left atrial appendage thrombus was detected.  4. The mitral valve is normal in structure. No evidence of mitral valve regurgitation.  5. The aortic valve is tricuspid. Aortic valve regurgitation is trivial. No aortic stenosis is present.  6. Agitated saline contrast bubble study was negative, with no evidence of any interatrial shunt. FINDINGS  Left Ventricle: Left ventricular ejection fraction, by estimation, is 50 to 55%. The left ventricle has low normal function. The left ventricular internal cavity size was normal in size. Right Ventricle: The right ventricular size is normal. No increase in right ventricular wall thickness. Right ventricular systolic function is normal. Left Atrium: Left atrial size was normal in size. No left atrial/left atrial appendage thrombus was detected. Right Atrium: Right atrial size was normal in size. Pericardium: There is no evidence of pericardial effusion. Mitral Valve: The mitral valve is normal in structure. No evidence of mitral valve regurgitation. Tricuspid Valve: The tricuspid valve is normal in structure. Tricuspid valve regurgitation is trivial. Aortic Valve: The aortic valve is tricuspid. Aortic valve regurgitation is trivial. No aortic stenosis is present. Pulmonic Valve: The pulmonic valve was grossly normal. Pulmonic valve regurgitation is not visualized. Aorta: The aortic root is normal in size and structure. IAS/Shunts: No atrial level shunt detected by color flow Doppler. Agitated saline contrast was given intravenously to evaluate for intracardiac shunting. Agitated saline contrast bubble study was negative, with no evidence of any interatrial shunt. MD Electronically signed by MD Signature Date/Time: 07/19/2020/5:22:36 PM    Final     Assessment/Plan: stable  LOS: 16 days  Awaits SNF. Follow up with neurosurgery as  outpatient. Will discuss future replacement of skull flap in follow up.    [name] 07/20/2020, 7:25 AM  Patient is continuing to make progress.

## 2020-07-20 NOTE — Progress Notes (Addendum)
STROKE TEAM PROGRESS NOTE   INTERVAL HISTORY TEE yesterday showed no acute findings Did not pass modified  barium swallow today. Will plan for cortrak replacement.  Discussed mobilizing to chair with PT.  Neurosurg RN removed her staples this morning.  No family at bedside.    Denies pain or headache today. Anxious to get out of bed. Neuro exam is stable.   Right hand exam remains stable. Continues on heparin drip.    Vitals:   07/19/20 1955 07/19/20 2336 07/20/20 0330 07/20/20 0700  BP: (!) 148/75 (!) 146/77 (!) 151/81 (!) 176/75  Pulse: 97 98 98 100  Resp: 18 18 18  (!) 21  Temp: 98.2 F (36.8 C) 97.8 F (36.6 C) 98 F (36.7 C)   TempSrc: Oral Axillary Oral   SpO2: 98% 96% 97% 97%  Weight:      Height:       CBC:  Recent Labs  Lab 07/18/20 0428 07/20/20 0405  WBC 19.6* 14.8*  HGB 10.0* 8.8*  HCT 36.2 32.1*  MCV 78.0* 78.5*  PLT 739* 882*   Basic Metabolic Panel:  Recent Labs  Lab 07/18/20 0428 07/20/20 0405  NA 137 139  K 4.1 3.9  CL 100 105  CO2 25 25  GLUCOSE 140* 119*  BUN 10 6  CREATININE 0.66 0.59  CALCIUM 9.5 9.1   Lipid Panel:  No results for input(s): CHOL, TRIG, HDL, CHOLHDL, VLDL, LDLCALC in the last 168 hours. HgbA1c: No results for input(s): HGBA1C in the last 168 hours. Urine Drug Screen: No results for input(s): LABOPIA, COCAINSCRNUR, LABBENZ, AMPHETMU, THCU, LABBARB in the last 168 hours.  Alcohol Level No results for input(s): ETH in the last 168 hours.  IMAGING past 24 hours CT HEAD WO CONTRAST  Result Date: 07/14/2020  IMPRESSION:  1. Evolving right MCA territory infarct with slightly decreased swelling and midline shift.  2. Improved sinus inflammation. 3. No new abnormality.   PHYSICAL EXAM Blood pressure (!) 176/75, pulse 100, temperature 98 F (36.7 C), temperature source Oral, resp. rate (!) 21, height 5\' 5"  (1.651 m), weight 72.5 kg, SpO2 97 %.  General: caucasian female, lying in bed in NAD  Lungs: Symmetrical Chest rise,  no labored breathing  Cardio: Regular Rate and Rhythm  Abdomen: Soft, non-tender  Skin: right thumb mild discoloration with tenderness to touch, right fingers less cool, dry eschar wound on the palmar region unchanged with tender to touch.  Neuro - Awake, alert, eyes open,alert with psychomotor slowing,oriented to place, self, month.  No aphasiabut paucity of speech, moderate to severe dysarthria, followingmost of thesimple commands. Increased verbal response today. Speaking in full sentences and appropriately in context of conversation. Right gaze preference, not cross midline, trackingon the right,not blinking to visual threat on the left, PERRL.Left facial droop. Tongue midline.RUE and RLE spontaneous movement against gravity and LUE flaccid and LLE slight withdraw to pain.Sensation and coordination not cooperative, gait not tested.   ASSESSMENT/PLAN [name] is a 35 y.o. female with history of uncontrolled diabetesandhypertensionpresenting with left sided weakness and slurred speech.Per family there is a complicated social situation at this time. She lives intermittently in an apartment by herself but was kicked out and they went to pick her up at 5:30 AM. At that point she seemed "off"and generally quiet but did not seem to have any difficulty with left-sided weakness.Family attributed this to psychosocial issues and potential substance use noting that her sister passed away from an overdose in October. Later in the  morning she complained of diffuse body pain and was administered gabapentin and a muscle relaxer.At 11:30 AM she stood up and walkedto the showerand at1215 she was found on the bottom of the tub with slurred speech and difficulty moving the left side. She was not preactivated by EMS, but on arrival in the ED clearly appear to have a doorway examination consistent with a right MCA territory infarct.CTA revealed right ICA occlusion as well as a right  common carotid bifurcation thrombus extending into the external carotid (felt to be the source of the occlusion). CT perfusion imaging demonstrated possible core infarct of 51 mL (though notably this was obtained within 2 hours of symptom onset and therefore may reflect an overestimate of the true core). Mismatch ratio was 2 with a penumbra of 63 mL. Case was discussed with Dr. Corliss Skains of neuro interventional radiology and Dr. Otelia Limes of [facility] and the patient was accepted for transfer to [facility] for emergent intervention. She was also started on TPA after consenting the patient's emergency contact with full discussion of risks of bleeding and importance of accurately determining the patient's last known well.[name] confirm the last known well as described above and agreed that the patient would want TPA given the risk/benefit.She confirmed that the patient had no recent procedures or other contraindications to TPA.Regarding her baseline, she is functional at baseline and family believes she is smoking at least1pack a day currently. She is on Metformin for her diabetes, does not take oral contraceptive pills, has had no recent surgeries or procedures.   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   CT no acute finding but suspicious for right MCA early ischemic changes.   CTA head and neck showed right ICA terminal and proximal MCA occlusion, right CCA nonocclusive thrombus.  CT perfusion showed large penumbra.   Status post thrombectomy with right terminal ICA, right PCA and right ACA TICI2breperfusion however does have distal micro emboli.  MRI showed large  right MCA infarct with 49mm MLS and hemorrhagic transformation.   MRA showed patent intracranial vessels.  CT Head WO Contrast 3/26 620 - Improved midline shift and normalized left lateral ventricular volume after decompressive craniectomy. No progressive infarct or hemorrhage.  2D Echo- EF: 60-65%. No wall motion abnormality.  TCD Bubble - No PFO  TEE today no source of emboli, no PFO  DVT US -No evidence of DVT bilaterally  LDL99  HgbA1c10.6  VTE prophylaxis -heparin IV  No antithromboticprior to admission, now on heparin IV, pharmacy managed  Therapy recommendations:CIR however no family support so SNF is working plan  Disposition:SNF due to lack of family support  Cerebral edema  CT Headrepeat3/24 -Continued interval evolution of right MCA distribution infarct with associated hemorrhagic transformation. Worsened regional mass effect and edema with increased right-to-left midline shift now measuring up to 9 mm.Increased dilatation of the right lateral ventricle concerning for developing ventricular trapping. No other new acute intracranial abnormality.  CT repeat 3/25 unchangedright cerebral cytotoxic edema with central hemorrhage. Midline shift is similar at 1 cm.Unchanged left lateral ventricle dilatation with evidence of transependymal flow.  S/pdecompressive hemicraniectomy (3/25)with Dr. Venetia Maxon  CT Head WO Contrast 3/26 620 - Improved midline shift and normalized left lateral ventricular volume after decompressive craniectomy. No progressive infarct  or hemorrhage.  On keppra 500 mg bid  3% salineoff  Na 137->141->147->150>158>150> 141>139>140>139  Staple removal completed on 4/8  Fever, resolved Leukocytosis, improving  Tmax 101.7->102.5> 98.6> 98.7>99.9>afebrile  WBC - 16.2>15.7>16.8>18.6->21.5->19.6->14.8  TEE no endocarditis  UA (4/3) neg  CXR4/4-left perihilar atelectasis  CXR 4/7 No acute findings   Abd Xray 4/7 No acute  findings  Respiratory Culture (3/26) - Strep Pneumoniae  Blood CxNo growth after5days drawn 3/25  Completed 7 day course of Ceftriaxone on 4/2  ID on board, do not think endocarditis.  Tachycardia and tachypnea Leukocytosis  WBC - 16.2>15.7>16.8>18.6->21.5->19.6->14.8  Likely related to ischemic right hand  on metoprolol 25mg  bid   UA: negative  ABG: ph 7.4 pCO2 40 pao2 155    LE venous doppler repeat (4/3): No evidence of deep vein thrombosis   ID on board, do not consider endocarditis  R critical limb ischemia  S/p thrombectomy (3/24) for right brachial artery occlusion  Now Right hand pain, cool to touch, dry eschar wound on the palmar region, decreased dopplers per RN, and tachycardia, tachypnea with leukocytosis  UE arterial duplex (4/3) - right ulnar artery occlusion.   VVS Dr. 09-30-1980 - No surgical intervention at this time.  Recommends medical management with Heparin infusion.    Discussed with Dr. Chestine Spore with Neurosurgery ok'd the initiation of Heparin  Heparin infusion started on 4/3. Plan to transition to anticoagulation at discharge assuming no other issues per Dr. 6/3.  Percocet 5/325 q 6hr prn for right hand pain  Hyperlipidemia  Home meds:none  LDL 99, goal < 70  On lipitor 40 now  Continue statin at discharge  Poorly controlled Diabetes type II, with hyperglycemia   Home meds:Metformin, Glargine, Aspart  HgbA1c 10.6, goal < 7.0  CBGs  SSI  Levemir to 35 units bid  Novolog 6 units every 4 hours  Dysphagia  Feeding via cortrak  cortrak removed during TEE today  SLP following, NPO continues after barium swallow today.    S/p Cortrak 4/8 - on TF @ 50 and IVF @ 25  My need to consider PEG as she is not progressing with speech.   Speech following next week for repeat eval  Cocaine abuse  UDS positive for cocaine  Cessation education will be provided  Constipation  On bowel regimen  Last BMP 4/2  Dulcolax  suppository after dinner tonight if no BM       by then   Sick thyroid   TSH: 0.307 (Low) Free T4:1.27 (high)  On metoprolol 25 bid  Repeat TSH and FT4 are WNL  IDA  BID Ferrous sulfate  4/8 Iron level low at 21, Ferritin and TIBC wnl   Tobacco abuse  Current smoker  Smoking cessation counselingwill beprovided  Other Stroke Risk Factors  Substance abuse - UDS: THC POSITIVEWill advise patientto stop using due to stroke risk.  Other Active Problems    Hospital day # 16   This plan of care was directed by Dr. 6/8.   Roda Shutters, NP-C   ATTENDING NOTE: I reviewed above note and agree with the assessment and plan. Pt was seen and examined.   No family at bedside, patient lying in bed, more interactive, asking " am I going to be better".  Still has left hemiplegia, right gaze, left neglect.  Right hand warm in the palm, cold in the fingers with discoloration of right thumb and index finger, painful on palpation but improving from prior.  ID does not think patient  had endocarditis.  No antibiotics needed.  Patient tachycardia improved after low-dose metoprolol.  Leukocytosis also improving.  TSH and free T4 repeat normal.  Did not pass swallow, still n.p.o., speech will follow up next week.  If not progressing, will consider PEG tube before SNF.  Continue heparin IV for now in case PEG tube needed.  After PEG and before discharge, heparin IV can be transitioned to DOAC per vascular surgery recommendations.  Scalp staples have been removed.  For detailed assessment and plan, please refer to above as I have made changes wherever appropriate.   Brandi PlanJindong Liona Wengert, MD PhD Stroke Neurology 07/20/2020 7:15 PM   To contact Stroke Continuity provider, please refer to [email or web address]. After hours, contact General Neurology

## 2020-07-20 NOTE — Progress Notes (Signed)
Modified Barium Swallow Progress Note  Patient Details  Name: Brandi Myers MRN: [id number] Date of Birth: June 30, 1985  Today's Date: 07/20/2020  Modified Barium Swallow completed.  Full report located under Chart Review in the Imaging Section.  Brief recommendations include the following:  Clinical Impression  Although Brandi Myers was adequately alert for MBS, protective measures for airway closure were late allowing barium to enter vestibule without exiting. Nectar and honey thick aggregated in pyriform sinuses for 6-9 seconds and fell onto vocal cords during the swallow. There was sensation as evidenced by strong cough with incomplete expulsion. Brandi Myers cognitive impairments, primarily decreased ability to attend to bolus transfer exacerbated timing and coordination. Of note, pt's closure and coordination were improved with two trials/swallows avoiding penetration. Viscocity of pudding allowed laryngeal closure to occur prior to proluplsion of bolus into hypopharynx and did not enter vstibule. Compliance of strategy cannot be relied upon at this point and recommend re-placement of alternative feeding tube. Therapist intervention will target trials of pudding and increase attention, transit and swallow initiation and hopefully can avoid longer term means of nutrition however, uncertain at this time.   Swallow Evaluation Recommendations       SLP Diet Recommendations: NPO       Medication Administration: Via alternative means               Oral Care Recommendations: Oral care QID        Royce Macadamia 07/20/2020,11:38 AM  Breck Coons Lonell Face.Ed Nurse, children's [phone] Office [phone]

## 2020-07-20 NOTE — Progress Notes (Signed)
Physical Therapy Treatment Patient Details Name: Brandi Myers MRN: [id number] DOB: [birth year] Today's Date: 07/20/2020    History of Present Illness 35 yo female admitted with L sided weakness givent TPA for R MCA involving basal ganglia frontal temporal lobes  3/23 s/p BIL revascularization  R MCA ACA achieving 2B revascularization PT intubated starting 3/23 . 3/24 R UE thromectomy CT reveals worsening midline shift start 3% saline 3/25 decompressive hemicranitectomy R side bone flap in abdomen 3/25 fever 103. Extubated 3/31. PMH current smoker uncontrolled DM HTn    PT Comments    Pt soiled in urine and restless in bed upon PT and OT arrival to room. Pt requiring max-total assist +2 for bilateral rolling and pericare, seated tasks, and repeated standing. Pt demonstrating body-wide weakness L>R and requires both LEs to be blocked with max truncal and pelvic facilitation to maintain upright, standing tolerance ~10 seconds at this time. Pt continues to present with L inattention, requires max cuing to attend to L. No controlled, active movement noted of LUE/LLE. PT to continue to follow acutely.    Follow Up Recommendations  SNF (CIR denied pt)     Equipment Recommendations  Wheelchair (measurements PT);Wheelchair cushion (measurements PT);Hospital bed (mechanical lift)    Recommendations for Other Services       Precautions / Restrictions Precautions Precautions: Fall Precaution Comments: bone flap out in R abdomen, L hemiparesis, cortrak, helmet with activity Restrictions Weight Bearing Restrictions: No    Mobility  Bed Mobility Overal bed mobility: Needs Assistance Bed Mobility: Supine to Sit;Sit to Supine Rolling: Max assist   Supine to sit: Max assist;+2 for physical assistance Sit to supine: Total assist;+2 for safety/equipment;+2 for physical assistance   General bed mobility comments: Patient able to assist with rolling, requiring max assist for completion of trunk  roll and sequencing step-by-step. Max +2 for supine>sit for trunk and LE management, pt using RUE to assist. Total assist +2 for return to supine with multiple boosts required to return pt to good positioning.    Transfers Overall transfer level: Needs assistance Equipment used: 2 person hand held assist Transfers: Sit to/from Stand Sit to Stand: Total assist;+2 physical assistance;+2 safety/equipment         General transfer comment: Performed sit to stand x 3 - all three stands total assist x 2. Patient not significantly assisting with RLE - both knees blocked, hand placements on pelvis and sternum to keep patient upright. Patient's cognitive impairments and inattention limiting activity. Patient perseverates on right hand.  Ambulation/Gait                 Stairs             Wheelchair Mobility    Modified Rankin (Stroke Patients Only) Modified Rankin (Stroke Patients Only) Pre-Morbid Rankin Score: No symptoms Modified Rankin: Severe disability     Balance Overall balance assessment: Needs assistance Sitting-balance support: Single extremity supported;Feet supported Sitting balance-Leahy Scale: Zero Sitting balance - Comments: mod- max assistance to maintain balance. Attempts to lean patient on right elbow. Patient unable to assist with correcting balance. Postural control: Left lateral lean Standing balance support: During functional activity Standing balance-Leahy Scale: Zero Standing balance comment: total +2                            Cognition Arousal/Alertness: Awake/alert Behavior During Therapy: Flat affect;Impulsive;Restless Overall Cognitive Status: Impaired/Different from baseline Area of Impairment: Attention;Following commands;Problem solving;Awareness;Safety/judgement;Memory  Current Attention Level: Focused Memory: Decreased short-term memory;Decreased recall of precautions Following Commands: Follows one  step commands inconsistently Safety/Judgement: Decreased awareness of safety;Decreased awareness of deficits Awareness: Intellectual Problem Solving: Slow processing;Decreased initiation;Difficulty sequencing;Requires verbal cues;Requires tactile cues General Comments: Pt with poor awareness into her deficits, needing repeated multi-modal cues to attend to them and correct body position to ensure safety. Poor sequencing, poor attention span requiring frequent cues. Pt responds best if she is addressed by name prior to giving command/cue. Inattention to left.      Exercises Other Exercises Other Exercises: Bilateral CL reaching, x5 with PROM assist on L; to break up truncal rigidity and stiffness Other Exercises: Passive ROM to left wrist and hand, no significant tightness and able to fully ranged.    General Comments General comments (skin integrity, edema, etc.): 1 finger sulcus sign LUE, improved with proprioceptive WB      Pertinent Vitals/Pain Pain Assessment: Faces Faces Pain Scale: Hurts little more Pain Location: R wrist/hand Pain Descriptors / Indicators: Guarding;Other (Comment) (hyperfocus) Pain Intervention(s): Limited activity within patient's tolerance;Monitored during session;Repositioned    Home Living                      Prior Function            PT Goals (current goals can now be found in the care plan section) Acute Rehab PT Goals Patient Stated Goal: to get up and move PT Goal Formulation: With patient Time For Goal Achievement: 07/24/20 Potential to Achieve Goals: Fair Progress towards PT goals: Progressing toward goals    Frequency    Min 4X/week      PT Plan Discharge plan needs to be updated    Co-evaluation PT/OT/SLP Co-Evaluation/Treatment: Yes Reason for Co-Treatment: For patient/therapist safety;To address functional/ADL transfers;Necessary to address cognition/behavior during functional activity PT goals addressed during  session: Mobility/safety with mobility;Balance;Strengthening/ROM OT goals addressed during session: Strengthening/ROM;ADL's and self-care (Neuro - Re-ed, functional mobility)      AM-PAC PT "6 Clicks" Mobility   Outcome Measure  Help needed turning from your back to your side while in a flat bed without using bedrails?: A Lot Help needed moving from lying on your back to sitting on the side of a flat bed without using bedrails?: Total Help needed moving to and from a bed to a chair (including a wheelchair)?: Total Help needed standing up from a chair using your arms (e.g., wheelchair or bedside chair)?: Total Help needed to walk in hospital room?: Total Help needed climbing 3-5 steps with a railing? : Total 6 Click Score: 7    End of Session Equipment Utilized During Treatment: Gait belt;Other (comment) (helmet) Activity Tolerance: Patient limited by fatigue Patient left: in bed;with bed alarm set;with call bell/phone within reach Nurse Communication: Mobility status;Need for lift equipment PT Visit Diagnosis: Other abnormalities of gait and mobility (R26.89);Hemiplegia and hemiparesis;Other symptoms and signs involving the nervous system (R29.898);Muscle weakness (generalized) (M62.81);Difficulty in walking, not elsewhere classified (R26.2) Hemiplegia - Right/Left: Left Hemiplegia - dominant/non-dominant: Non-dominant Hemiplegia - caused by: Cerebral infarction     Time: 0947-0962 PT Time Calculation (min) (ACUTE ONLY): 42 min  Charges:  $Therapeutic Activity: 8-22 mins                    Marye Round, PT Acute Rehabilitation Services Pager [phone]  Office [phone]  Tyrone Apple E Christain [place] 07/20/2020, 4:15 PM

## 2020-07-20 NOTE — Progress Notes (Addendum)
ANTICOAGULATION CONSULT NOTE - Follow Up Consult  Pharmacy Consult for Heparin Indication: RUE occlusion  Allergies  Allergen Reactions  . Norco [Hydrocodone-Acetaminophen] Itching, Nausea And Vomiting and Swelling    Patient Measurements: Height: 5\' 5"  (165.1 cm) Weight: 72.5 kg (159 lb 13.3 oz) IBW/kg (Calculated) : 57  Vital Signs: Temp: 97.9 F (36.6 C) (04/08 1100) Temp Source: Axillary (04/08 1100) BP: 161/82 (04/08 1100) Pulse Rate: 91 (04/08 1100)  Labs: Recent Labs    07/18/20 0428 07/18/20 0941 07/19/20 2233 07/20/20 0405 07/20/20 0756 07/20/20 1122  HGB 10.0*  --   --  8.8*  --   --   HCT 36.2  --   --  32.1*  --   --   PLT 739*  --   --  882*  --   --   HEPARINUNFRC 0.43   < > 0.64  --  <0.10* <0.10*  CREATININE 0.66  --   --  0.59  --   --    < > = values in this interval not displayed.    Estimated Creatinine Clearance: 97.9 mL/min (by C-G formula based on SCr of 0.59 mg/dL).  Assessment: 35 y.o. female with RUE occlusion s/p thrombectomy for heparin  Heparin level now undetectable and then confirmed with stat draw. Will increase drip  Goal of Therapy:  Heparin level 0.3-0.5 units/mL Monitor platelets by anticoagulation protocol: Yes   Plan:  Increase Heparin to 2050 units/hr Check heparin level in 6 hours.    Brandi Myers A. 31, PharmD, BCPS, FNKF Clinical Pharmacist [place] Please utilize Amion for appropriate phone number to reach the unit pharmacist [facility])   07/20/2020,12:51 PM

## 2020-07-20 NOTE — Progress Notes (Signed)
ANTICOAGULATION CONSULT NOTE - Follow Up Consult  Pharmacy Consult for Heparin Indication: RUE occlusion  Allergies  Allergen Reactions  . Norco [Hydrocodone-Acetaminophen] Itching, Nausea And Vomiting and Swelling    Patient Measurements: Height: 5\' 5"  (165.1 cm) Weight: 72.5 kg (159 lb 13.3 oz) IBW/kg (Calculated) : 57  Vital Signs: Temp: 97.8 F (36.6 C) (04/07 2336) Temp Source: Axillary (04/07 2336) BP: 146/77 (04/07 2336) Pulse Rate: 98 (04/07 2336)  Labs: Recent Labs    07/18/20 0428 07/18/20 0941 07/19/20 0413 07/19/20 1457 07/19/20 2233  HGB 10.0*  --   --   --   --   HCT 36.2  --   --   --   --   PLT 739*  --   --   --   --   HEPARINUNFRC 0.43   < > 0.20* 0.62 0.64  CREATININE 0.66  --   --   --   --    < > = values in this interval not displayed.    Estimated Creatinine Clearance: 97.9 mL/min (by C-G formula based on SCr of 0.66 mg/dL).  Assessment: 35 y.o. female with RUE occlusion s/p thrombectomy for heparin  Goal of Therapy:  Heparin level 0.3-0.5 units/mL Monitor platelets by anticoagulation protocol: Yes   Plan:  Decrease Heparin 1950 units/hr Check heparin level in 8 hours.    31, PharmD, BCPS  07/20/2020,12:39 AM

## 2020-07-20 NOTE — Progress Notes (Signed)
Brief Nutrition Follow-up:  Cortrak replaced today. NPO currently.   Pt with standing TF orders for Glucerna 1.5 at 50 ml/hr  Discussed with RN. Per RD assessment from yesterday 4/7, will  resume Glucerna 1.5 at 50 ml/hr via Cortrak  Romelle Starcher MS, RDN, LDN, CNSC Registered Dietitian III Clinical Nutrition RD Pager and On-Call Pager Number Located in [place]

## 2020-07-20 NOTE — Progress Notes (Signed)
ANTICOAGULATION CONSULT NOTE - Follow Up Consult  Pharmacy Consult for Heparin Indication: RUE occlusion  Allergies  Allergen Reactions  . Norco [Hydrocodone-Acetaminophen] Itching, Nausea And Vomiting and Swelling    Patient Measurements: Height: 5\' 5"  (165.1 cm) Weight: 72.5 kg (159 lb 13.3 oz) IBW/kg (Calculated) : 57  Vital Signs: Temp: 98.7 F (37.1 C) (04/08 1500) Temp Source: Oral (04/08 1500) BP: 159/88 (04/08 1500) Pulse Rate: 95 (04/08 1500)  Labs: Recent Labs    07/18/20 0428 07/18/20 0941 07/20/20 0405 07/20/20 0756 07/20/20 1122 07/20/20 1856  HGB 10.0*  --  8.8*  --   --   --   HCT 36.2  --  32.1*  --   --   --   PLT 739*  --  882*  --   --   --   HEPARINUNFRC 0.43   < >  --  <0.10* <0.10* 0.13*  CREATININE 0.66  --  0.59  --   --   --    < > = values in this interval not displayed.    Estimated Creatinine Clearance: 97.9 mL/min (by C-G formula based on SCr of 0.59 mg/dL).  Assessment: 35 y.o. female with RUE occlusion s/p thrombectomy for heparin  Heparin level up slightly but still low. Drip has been running fine per Rn. We will increase rate again and check in AM.   Goal of Therapy:  Heparin level 0.3-0.5 units/mL Monitor platelets by anticoagulation protocol: Yes   Plan:  Increase Heparin to 2200 units/hr Check heparin level in AM.    31, PharmD, BCIDP, AAHIVP, CPP Infectious Disease Pharmacist 07/20/2020 7:48 PM

## 2020-07-21 DIAGNOSIS — I6601 Occlusion and stenosis of right middle cerebral artery: Secondary | ICD-10-CM | POA: Diagnosis not present

## 2020-07-21 LAB — CBC
HCT: 34.5 % — ABNORMAL LOW (ref 36.0–46.0)
Hemoglobin: 9.5 g/dL — ABNORMAL LOW (ref 12.0–15.0)
MCH: 21.8 pg — ABNORMAL LOW (ref 26.0–34.0)
MCHC: 27.5 g/dL — ABNORMAL LOW (ref 30.0–36.0)
MCV: 79.3 fL — ABNORMAL LOW (ref 80.0–100.0)
Platelets: 886 10*3/uL — ABNORMAL HIGH (ref 150–400)
RBC: 4.35 MIL/uL (ref 3.87–5.11)
RDW: 26.5 % — ABNORMAL HIGH (ref 11.5–15.5)
WBC: 14.8 10*3/uL — ABNORMAL HIGH (ref 4.0–10.5)
nRBC: 0 % (ref 0.0–0.2)

## 2020-07-21 LAB — BASIC METABOLIC PANEL
Anion gap: 13 (ref 5–15)
BUN: 5 mg/dL — ABNORMAL LOW (ref 6–20)
CO2: 21 mmol/L — ABNORMAL LOW (ref 22–32)
Calcium: 9.1 mg/dL (ref 8.9–10.3)
Chloride: 105 mmol/L (ref 98–111)
Creatinine, Ser: 0.52 mg/dL (ref 0.44–1.00)
GFR, Estimated: >60 mL/min (ref >60)
Glucose, Bld: 62 mg/dL — ABNORMAL LOW (ref 70–99)
Potassium: 3.4 mmol/L — ABNORMAL LOW (ref 3.5–5.1)
Sodium: 139 mmol/L (ref 135–145)

## 2020-07-21 LAB — HEPARIN LEVEL (UNFRACTIONATED)
Heparin Unfractionated: 0.19 IU/mL — ABNORMAL LOW (ref 0.30–0.70)
Heparin Unfractionated: <0.1 IU/mL — ABNORMAL LOW (ref 0.30–0.70)
Heparin Unfractionated: 0.4 IU/mL (ref 0.30–0.70)

## 2020-07-21 LAB — GLUCOSE, CAPILLARY
Glucose-Capillary: 116 mg/dL — ABNORMAL HIGH (ref 70–99)
Glucose-Capillary: 122 mg/dL — ABNORMAL HIGH (ref 70–99)
Glucose-Capillary: 147 mg/dL — ABNORMAL HIGH (ref 70–99)
Glucose-Capillary: 75 mg/dL (ref 70–99)
Glucose-Capillary: 93 mg/dL (ref 70–99)

## 2020-07-21 MED ORDER — FERROUS SULFATE 300 (60 FE) MG/5ML PO SYRP
300.0000 mg | ORAL_SOLUTION | Freq: Two times a day (BID) | ORAL | Status: DC
  Administered 2020-07-21 – 2020-07-24 (×7): 300 mg
  Filled 2020-07-21 (×7): qty 5

## 2020-07-21 MED ORDER — METOPROLOL TARTRATE 25 MG PO TABS
25.0000 mg | ORAL_TABLET | Freq: Two times a day (BID) | ORAL | Status: DC
  Administered 2020-07-21 – 2020-07-22 (×3): 25 mg
  Filled 2020-07-21 (×3): qty 1

## 2020-07-21 NOTE — Progress Notes (Signed)
STROKE TEAM PROGRESS NOTE   INTERVAL HISTORY Patient is lying in bed appears comfortable.  She follows simple commands.  Continues to have significant left hemineglect and hemiplegia.  She did not pass modified  barium swallow yesterday.  She has core track tube placed. Neurosurg RN removed her staples yesterday. No family at bedside.    She remains slightly tachycardic.  White count is stable.  Afebrile. neuro exam is stable and unchanged.   Right hand exam remains stable. Continues on heparin drip for right ulnar artery occlusion with plans to transition to oral anticoagulation and discharge.    Vitals:   07/21/20 0412 07/21/20 0810 07/21/20 0818 07/21/20 1144  BP:  120/83  126/84  Pulse:  (!) 108  100  Resp:    19  Temp:   98 F (36.7 C) 98.6 F (37 C)  TempSrc:   Oral Oral  SpO2:  99%    Weight: 70.7 kg     Height:       CBC:  Recent Labs  Lab 07/20/20 0405 07/21/20 0224  WBC 14.8* 14.8*  HGB 8.8* 9.5*  HCT 32.1* 34.5*  MCV 78.5* 79.3*  PLT 882* 886*   Basic Metabolic Panel:  Recent Labs  Lab 07/20/20 0405 07/21/20 0224  NA 139 139  K 3.9 3.4*  CL 105 105  CO2 25 21*  GLUCOSE 119* 62*  BUN 6 5*  CREATININE 0.59 0.52  CALCIUM 9.1 9.1   Lipid Panel:  No results for input(s): CHOL, TRIG, HDL, CHOLHDL, VLDL, LDLCALC in the last 168 hours. HgbA1c: No results for input(s): HGBA1C in the last 168 hours. Urine Drug Screen: No results for input(s): LABOPIA, COCAINSCRNUR, LABBENZ, AMPHETMU, THCU, LABBARB in the last 168 hours.  Alcohol Level No results for input(s): ETH in the last 168 hours.  IMAGING past 24 hours CT HEAD WO CONTRAST  Result Date: 07/14/2020  IMPRESSION:  1. Evolving right MCA territory infarct with slightly decreased swelling and midline shift.  2. Improved sinus inflammation. 3. No new abnormality.   PHYSICAL EXAM Blood pressure 126/84, pulse 100, temperature 98.6 F (37 C), temperature source Oral, resp. rate 19, height 5\' 5"  (1.651 m),  weight 70.7 kg, SpO2 99 %.  General: caucasian female, lying in bed in NAD  Lungs: Symmetrical Chest rise, no labored breathing  Cardio: Regular Rate and Rhythm  Abdomen: Soft, non-tender  Skin: right thumb mild discoloration with tenderness to touch, right fingers less cool, dry eschar wound on the palmar region unchanged with tender to touch.  Neuro - Awake, alert, eyes open,alert with psychomotor slowing,oriented to place, self, month.  No aphasiabut paucity of speech, moderate to severe dysarthria, followingmost of thesimple commands. Increased verbal response today. Speaking in full sentences and appropriately in context of conversation. Right gaze preference, not cross midline, trackingon the right,not blinking to visual threat on the left, PERRL.Left facial droop. Tongue midline.RUE and RLE spontaneous movement against gravity and LUE flaccid and LLE slight withdraw to pain.Sensation and coordination not cooperative, gait not tested.   ASSESSMENT/PLAN Brandi Myers is a 35 y.o. female with history of uncontrolled diabetesandhypertensionpresenting with left sided weakness and slurred speech.Per family there is a complicated social situation at this time. She lives intermittently in an apartment by herself but was kicked out and they went to pick her up at 5:30 AM. At that point she seemed "off"and generally quiet but did not seem to have any difficulty with left-sided weakness.Family attributed this to psychosocial issues and  potential substance use noting that her sister passed away from an overdose in October. Later in the morning she complained of diffuse body pain and was administered gabapentin and a muscle relaxer.At 11:30 AM she stood up and walkedto the showerand at1215 she was found on the bottom of the tub with slurred speech and difficulty moving the left side. She was not preactivated by EMS, but on arrival in the ED clearly appear to have a doorway  examination consistent with a right MCA territory infarct.CTA revealed right ICA occlusion as well as a right common carotid bifurcation thrombus extending into the external carotid (felt to be the source of the occlusion). CT perfusion imaging demonstrated possible core infarct of 51 mL (though notably this was obtained within 2 hours of symptom onset and therefore may reflect an overestimate of the true core). Mismatch ratio was 2 with a penumbra of 63 mL. Case was discussed with Dr. Corliss Skains of neuro interventional radiology and Dr. Otelia Limes of [facility] and the patient was accepted for transfer to [facility] for emergent intervention. She was also started on TPA after consenting the patient's emergency contact with full discussion of risks of bleeding and importance of accurately determining the patient's last known well.[name] confirm the last known well as described above and agreed that the patient would want TPA given the risk/benefit.She confirmed that the patient had no recent procedures or other contraindications to TPA.Regarding her baseline, she is functional at baseline and family believes she is smoking at least1pack a day currently. She is on Metformin for her diabetes, does not take oral contraceptive pills, has had no recent surgeries or procedures.   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   CT no acute finding but suspicious for right MCA early ischemic changes.   CTA head and neck showed right ICA terminal and proximal MCA occlusion, right CCA nonocclusive thrombus.  CT perfusion showed large penumbra.   Status post thrombectomy with right terminal  ICA, right PCA and right ACA TICI2breperfusion however does have distal micro emboli.  MRI showed large right MCA infarct with 75mm MLS and hemorrhagic transformation.   MRA showed patent intracranial vessels.  CT Head WO Contrast 3/26 620 - Improved midline shift and normalized left lateral ventricular volume after decompressive craniectomy. No progressive infarct or hemorrhage.  2D Echo- EF: 60-65%. No wall motion abnormality.  TCD Bubble - No PFO  TEE today no source of emboli, no PFO  DVT US -No evidence of DVT bilaterally  LDL99  HgbA1c10.6  VTE prophylaxis -heparin IV  No antithromboticprior to admission, now on heparin IV for right ulnar artery occlusion, pharmacy managed  Therapy recommendations:CIR however no family support so SNF is working plan  Disposition:SNF due to lack of family support  Cerebral edema  CT Headrepeat3/24 -Continued interval evolution of right MCA distribution infarct with associated hemorrhagic transformation. Worsened regional mass effect and edema with increased right-to-left midline shift now measuring up to 9 mm.Increased dilatation of the right lateral ventricle concerning for developing ventricular trapping. No other new acute intracranial abnormality.  CT repeat 3/25 unchangedright cerebral cytotoxic edema with central hemorrhage. Midline shift is similar at 1 cm.Unchanged left lateral ventricle dilatation with evidence of transependymal flow.  S/pdecompressive hemicraniectomy (3/25)with Dr. Venetia Maxon  CT Head WO Contrast 3/26 620 - Improved midline shift and normalized left lateral ventricular volume after decompressive craniectomy. No progressive infarct or hemorrhage.  On keppra 500 mg bid  3% salineoff  Na 137->141->147->150>158>150> 141>139>140>139  Staple removal completed on 4/8  Fever, resolved Leukocytosis, improving  Tmax 101.7->102.5> 98.6> 98.7>99.9>afebrile  WBC -  16.2>15.7>16.8>18.6->21.5->19.6->14.8  TEE no endocarditis  UA (4/3) neg  CXR4/4-left perihilar atelectasis  CXR 4/7 No acute findings   Abd Xray 4/7 No acute findings  Respiratory Culture (3/26) - Strep Pneumoniae  Blood CxNo growth after5days drawn 3/25  Completed 7 day course of Ceftriaxone on 4/2  ID on board, do not think endocarditis.  Tachycardia and tachypnea Leukocytosis  WBC - 16.2>15.7>16.8>18.6->21.5->19.6->14.8  Likely related to ischemic right hand  on metoprolol 25mg  bid   UA: negative  ABG: ph 7.4 pCO2 40 pao2 155    LE venous doppler repeat (4/3): No evidence of deep vein thrombosis   ID on board, do not consider endocarditis  R critical limb ischemia  S/p thrombectomy (3/24) for right brachial artery occlusion  Now Right hand pain, cool to touch, dry eschar wound on the palmar region, decreased dopplers per RN, and tachycardia, tachypnea with leukocytosis  UE arterial duplex (4/3) - right ulnar artery occlusion.   VVS Dr. Chestine Sporelark - No surgical intervention at this time.  Recommends medical management with Heparin infusion.    Discussed with Dr. Yetta BarreJones with Neurosurgery ok'd the initiation of Heparin  Heparin infusion started on 4/3. Plan to transition to anticoagulation at discharge assuming no other issues per Dr. Chestine Sporelark.  Percocet 5/325 q 6hr prn for right hand pain  Hyperlipidemia  Home meds:none  LDL 99, goal < 70  On lipitor 40 now  Continue statin at discharge  Poorly controlled Diabetes type II, with hyperglycemia   Home meds:Metformin, Glargine, Aspart  HgbA1c 10.6, goal < 7.0  CBGs  SSI  Levemir to 35 units bid  Novolog 6 units every 4 hours  Dysphagia  Feeding via cortrak  cortrak removed during TEE today  SLP following, NPO continues after barium swallow today.    S/p Cortrak 4/8 - on TF @ 50 and IVF @ 25  My need to consider PEG as she is not progressing with speech.   Speech following next  week for repeat eval  Cocaine abuse  UDS positive for cocaine  Cessation education will be provided  Constipation  On bowel regimen  Last BMP 4/2  Dulcolax suppository after dinner tonight if no BM       by then   Sick thyroid   TSH: 0.307 (Low) Free T4:1.27 (high)  On metoprolol 25 bid  Repeat TSH and FT4 are WNL  IDA  BID Ferrous sulfate  4/8 Iron level low at 21, Ferritin and TIBC wnl   Tobacco abuse  Current smoker  Smoking cessation counselingwill beprovided  Other Stroke Risk Factors  Substance abuse - UDS: THC POSITIVEWill advise patientto stop using due to stroke risk.  Other Active Problems    Hospital day # 17     Neurological exam is unchanged and stable.  Right hand warm in the palm, with discoloration of fingers but improving from prior.   Patient tachycardia improved after low-dose metoprolol.  Leukocytosis also improving.  .  Did not pass swallow, still n.p.o., speech will follow up next week.  If not progressing, will consider PEG tube before SNF.  Continue heparin IV for now in case PEG tube needed.  After PEG  and before discharge, heparin IV can be transitioned to DOAC per vascular surgery recommendations.  No family available at the bedside greater than 50% time during this 25-minute visit was spent in counseling and coordination of care and discussion with care team. Delia Heady, MD 07/21/2020 3:24 PM   To contact Stroke Continuity provider, please refer to [email or web address]. After hours, contact General Neurology

## 2020-07-21 NOTE — Plan of Care (Signed)

## 2020-07-21 NOTE — Progress Notes (Signed)
ANTICOAGULATION CONSULT NOTE - Follow Up Consult  Pharmacy Consult for Heparin Indication: RUE occlusion  Allergies  Allergen Reactions  . Norco [Hydrocodone-Acetaminophen] Itching, Nausea And Vomiting and Swelling    Patient Measurements: Height: 5\' 5"  (165.1 cm) Weight: 70.7 kg (155 lb 13.8 oz) IBW/kg (Calculated) : 57  Vital Signs: Temp: 98.5 F (36.9 C) (04/09 1604) Temp Source: Oral (04/09 1604) BP: 126/83 (04/09 1604) Pulse Rate: 102 (04/09 1604)  Labs: Recent Labs    07/20/20 0405 07/20/20 0756 07/21/20 0224 07/21/20 1208 07/21/20 1754  HGB 8.8*  --  9.5*  --   --   HCT 32.1*  --  34.5*  --   --   PLT 882*  --  886*  --   --   HEPARINUNFRC  --    < > 0.19* <0.10* 0.40  CREATININE 0.59  --  0.52  --   --    < > = values in this interval not displayed.    Estimated Creatinine Clearance: 96.8 mL/min (by C-G formula based on SCr of 0.52 mg/dL).  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  Heparin level therapeutic at 0.4.   Goal of Therapy:  Heparin level 0.3-0.5 units/mL Monitor platelets by anticoagulation protocol: Yes   Plan:  Continue heparin at 2400 units/hr  Daily heparin level and CBC   31, PharmD, [place], [facility] Clinical Pharmacist [phone] Please check AMION for all [facility] numbers 07/21/2020

## 2020-07-21 NOTE — Progress Notes (Signed)
Subjective: Patient reports resting comfortably  Objective: Vital signs in last 24 hours: Temp:  [97.9 F (36.6 C)-98.7 F (37.1 C)] 98 F (36.7 C) (04/09 0818) Pulse Rate:  [91-113] 113 (04/09 0810) Resp:  [18-23] 19 (04/09 0402) BP: (120-161)/(68-88) 120/83 (04/09 0810) SpO2:  [96 %-100 %] 99 % (04/09 0810) Weight:  [70.7 kg] 70.7 kg (04/09 0412)  Intake/Output from previous day: 04/08 0701 - 04/09 0700 In: 821.7 [I.V.:221.7; NG/GT:600] Out: 300 [Urine:300] Intake/Output this shift: No intake/output data recorded.  Physical Exam: Scalp incision healing well, still some swelling.  Lab Results: Recent Labs    07/20/20 0405 07/21/20 0224  WBC 14.8* 14.8*  HGB 8.8* 9.5*  HCT 32.1* 34.5*  PLT 882* 886*   BMET Recent Labs    07/20/20 0405 07/21/20 0224  NA 139 139  K 3.9 3.4*  CL 105 105  CO2 25 21*  GLUCOSE 119* 62*  BUN 6 5*  CREATININE 0.59 0.52  CALCIUM 9.1 9.1    Studies/Results: DG Swallowing Func-Speech Pathology  Result Date: 07/20/2020 Objective Swallowing Evaluation: Type of Study: MBS-Modified Barium Swallow Study  Patient Details Name: Brandi Myers MRN: [id number] Date of Birth: 24-Nov-1985 Today's Date: 07/20/2020 Time: SLP Start Time (ACUTE ONLY): 8937 -SLP Stop Time (ACUTE ONLY): 0856 SLP Time Calculation (min) (ACUTE ONLY): 18 min Past Medical History: Past Medical History: Diagnosis Date . Diabetes mellitus without complication (HCC)  . Hypertension  Past Surgical History: Past Surgical History: Procedure Laterality Date . CESAREAN SECTION   . CHOLECYSTECTOMY   . CRANIOTOMY Right 07/06/2020  Procedure: RIGHT CRANIECTOMY WITH PLACEMENT OF BONE FLAP IN ABDOMEN;  Surgeon: Maeola Harman, MD;  Location: [facility] OR;  Service: Neurosurgery;  Laterality: Right; . IR ANGIO INTRA EXTRACRAN SEL COM CAROTID INNOMINATE UNI L MOD SED  07/04/2020 . IR ANGIO VERTEBRAL SEL VERTEBRAL UNI R MOD SED  07/04/2020 . IR CT HEAD LTD  07/04/2020 . IR CT HEAD LTD  07/04/2020 . IR  PERCUTANEOUS ART THROMBECTOMY/INFUSION INTRACRANIAL INC DIAG ANGIO  07/04/2020 . RADIOLOGY WITH ANESTHESIA N/A 07/04/2020  Procedure: IR WITH ANESTHESIA;  Surgeon: Radiologist, Medication, MD;  Location: [facility] OR;  Service: Radiology;  Laterality: N/A; . THROMBECTOMY BRACHIAL ARTERY Right 07/05/2020  Procedure: RIGHT UPPER EXTREMITY THROMBECTOMY;  Surgeon: Cephus Shelling, MD;  Location: [facility] OR;  Service: Vascular;  Laterality: Right; HPI: 35 yo female admitted with L sided weakness, given TPA for R MCA involving basal ganglia, frontal, temporal lobes  3/23 s/p BIL revascularization  R MCA ACA achieving 2B revascularization. Pt intubated 3/23-31. 3/24 R UE thrombectomy. CT revealed worsening midline shift. 3/25 decompressive hemicranitectomy, R side bone flap in abdomen. PMH current smoker uncontrolled DM HTn  Subjective: -- (adequately awake) Assessment / Plan / Recommendation CHL IP CLINICAL IMPRESSIONS 07/20/2020 Clinical Impression Although Brandi Myers was adequately alert for MBS, protective measures for airway closure were late allowing barium to enter vestibule without exiting. Nectar and honey thick aggregated in pyriform sinuses for 6-9 seconds and fell onto vocal cords during the swallow. There was sensation as evidenced by strong cough with incomplete expulsion. Brandi Myers's cognitive impairments, primarily decreased ability to attend to bolus transfer exacerbated timing and coordination. Of note, pt's closure and coordination were improved with two trials/swallows avoiding penetration. Viscocity of pudding allowed laryngeal closure to occur prior to proluplsion of bolus into hypopharynx and did not enter vstibule. Compliance of strategy cannot be relied upon at this point and recommend re-placement of alternative feeding tube. Therapist intervention will target trials of pudding  and increase attention, transit and swallow initiation and hopefully can avoid longer term means of nutrition however, uncertain at this  time. SLP Visit Diagnosis Dysphagia, oropharyngeal phase (R13.12) Attention and concentration deficit following -- Frontal lobe and executive function deficit following -- Impact on safety and function Severe aspiration risk   CHL IP TREATMENT RECOMMENDATION 07/20/2020 Treatment Recommendations Therapy as outlined in treatment plan below   Prognosis 07/20/2020 Prognosis for Safe Diet Advancement Good Barriers to Reach Goals Severity of deficits Barriers/Prognosis Comment -- CHL IP DIET RECOMMENDATION 07/20/2020 SLP Diet Recommendations NPO Liquid Administration via -- Medication Administration Via alternative means Compensations -- Postural Changes --   CHL IP OTHER RECOMMENDATIONS 07/20/2020 Recommended Consults -- Oral Care Recommendations Oral care QID Other Recommendations --   CHL IP FOLLOW UP RECOMMENDATIONS 07/20/2020 Follow up Recommendations Inpatient Rehab   CHL IP FREQUENCY AND DURATION 07/20/2020 Speech Therapy Frequency (ACUTE ONLY) min 2x/week Treatment Duration 2 weeks      CHL IP ORAL PHASE 07/20/2020 Oral Phase Impaired Oral - Pudding Teaspoon -- Oral - Pudding Cup -- Oral - Honey Teaspoon -- Oral - Honey Cup Weak lingual manipulation;Reduced posterior propulsion;Delayed oral transit;Decreased bolus cohesion Oral - Nectar Teaspoon -- Oral - Nectar Cup Delayed oral transit;Weak lingual manipulation;Reduced posterior propulsion Oral - Nectar Straw -- Oral - Thin Teaspoon -- Oral - Thin Cup -- Oral - Thin Straw -- Oral - Puree Delayed oral transit Oral - Mech Soft -- Oral - Regular -- Oral - Multi-Consistency -- Oral - Pill -- Oral Phase - Comment --  CHL IP PHARYNGEAL PHASE 07/20/2020 Pharyngeal Phase Impaired Pharyngeal- Pudding Teaspoon -- Pharyngeal -- Pharyngeal- Pudding Cup -- Pharyngeal -- Pharyngeal- Honey Teaspoon -- Pharyngeal -- Pharyngeal- Honey Cup Delayed swallow initiation-pyriform sinuses;Penetration/Aspiration before swallow Pharyngeal Material enters airway, CONTACTS cords and not ejected out  Pharyngeal- Nectar Teaspoon -- Pharyngeal -- Pharyngeal- Nectar Cup Delayed swallow initiation-pyriform sinuses;Penetration/Aspiration during swallow Pharyngeal -- Pharyngeal- Nectar Straw -- Pharyngeal -- Pharyngeal- Thin Teaspoon -- Pharyngeal -- Pharyngeal- Thin Cup -- Pharyngeal -- Pharyngeal- Thin Straw -- Pharyngeal -- Pharyngeal- Puree Delayed swallow initiation-vallecula Pharyngeal -- Pharyngeal- Mechanical Soft -- Pharyngeal -- Pharyngeal- Regular -- Pharyngeal -- Pharyngeal- Multi-consistency -- Pharyngeal -- Pharyngeal- Pill -- Pharyngeal -- Pharyngeal Comment --  CHL IP CERVICAL ESOPHAGEAL PHASE 07/20/2020 Cervical Esophageal Phase WFL Pudding Teaspoon -- Pudding Cup -- Honey Teaspoon -- Honey Cup -- Nectar Teaspoon -- Nectar Cup -- Nectar Straw -- Thin Teaspoon -- Thin Cup -- Thin Straw -- Puree -- Mechanical Soft -- Regular -- Multi-consistency -- Pill -- Cervical Esophageal Comment -- Royce Macadamia 07/20/2020, 11:36 AM  Breck Coons [employer] administrator Pager [phone] Office [phone]             ECHO TEE  Result Date: 07/19/2020    TRANSESOPHOGEAL ECHO REPORT   Patient Name:   DONINIQUE LWIN Cahoon Date of Exam: 07/19/2020 Medical Rec #:  [id number]       Height:       65.0 in Accession #:    [id number]      Weight:       159.8 lb Date of Birth:  03-09-1986      BSA:          1.798 m Patient Age:    34 years        BP:           134/87 mmHg Patient Gender: F  HR:           101 bpm. Exam Location:  Inpatient Procedure: Transesophageal Echo, 3D Echo, Cardiac Doppler, Color Doppler and            Saline Contrast Bubble Study Indications:    Stroke  History:        Patient has prior history of Echocardiogram examinations, most                 recent 07/05/2020. Risk Factors:Hypertension and Diabetes.  Sonographer:    Eulah Pont RDCS Referring Phys: [id number] [place] PROCEDURE: After discussion of the risks and benefits of a TEE, an informed consent was  obtained. The transesophogeal probe was passed without difficulty through the esophogus of the patient. Sedation performed by different physician. The patient was monitored while under deep sedation. Anesthestetic sedation was provided intravenously by Anesthesiology: 206.25mg  of Propofol, 80mg  of Lidocaine. The patient's vital signs; including heart rate, blood pressure, and oxygen saturation; remained stable throughout the procedure. The patient developed no complications during the procedure. IMPRESSIONS  1. Left ventricular ejection fraction, by estimation, is 50 to 55%. The left ventricle has low normal function.  2. Right ventricular systolic function is normal. The right ventricular size is normal.  3. No left atrial/left atrial appendage thrombus was detected.  4. The mitral valve is normal in structure. No evidence of mitral valve regurgitation.  5. The aortic valve is tricuspid. Aortic valve regurgitation is trivial. No aortic stenosis is present.  6. Agitated saline contrast bubble study was negative, with no evidence of any interatrial shunt. FINDINGS  Left Ventricle: Left ventricular ejection fraction, by estimation, is 50 to 55%. The left ventricle has low normal function. The left ventricular internal cavity size was normal in size. Right Ventricle: The right ventricular size is normal. No increase in right ventricular wall thickness. Right ventricular systolic function is normal. Left Atrium: Left atrial size was normal in size. No left atrial/left atrial appendage thrombus was detected. Right Atrium: Right atrial size was normal in size. Pericardium: There is no evidence of pericardial effusion. Mitral Valve: The mitral valve is normal in structure. No evidence of mitral valve regurgitation. Tricuspid Valve: The tricuspid valve is normal in structure. Tricuspid valve regurgitation is trivial. Aortic Valve: The aortic valve is tricuspid. Aortic valve regurgitation is trivial. No aortic stenosis is  present. Pulmonic Valve: The pulmonic valve was grossly normal. Pulmonic valve regurgitation is not visualized. Aorta: The aortic root is normal in size and structure. IAS/Shunts: No atrial level shunt detected by color flow Doppler. Agitated saline contrast was given intravenously to evaluate for intracardiac shunting. Agitated saline contrast bubble study was negative, with no evidence of any interatrial shunt. MD Electronically signed by MD Signature Date/Time: 07/19/2020/5:22:36 PM    Final     Assessment/Plan: Mobilize as tolerated.  Will need cranioplasty when improved.    LOS: 17 days    [name], MD 07/21/2020, 9:18 AM

## 2020-07-21 NOTE — Progress Notes (Signed)
ANTICOAGULATION CONSULT NOTE - Follow Up Consult  Pharmacy Consult for Heparin Indication: RUE occlusion  Allergies  Allergen Reactions  . Norco [Hydrocodone-Acetaminophen] Itching, Nausea And Vomiting and Swelling    Patient Measurements: Height: 5\' 5"  (165.1 cm) Weight: 70.7 kg (155 lb 13.8 oz) IBW/kg (Calculated) : 57  Vital Signs: Temp: 98.6 F (37 C) (04/09 1144) Temp Source: Oral (04/09 1144) BP: 126/84 (04/09 1144) Pulse Rate: 100 (04/09 1144)  Labs: Recent Labs    07/20/20 0405 07/20/20 0756 07/20/20 1856 07/21/20 0224 07/21/20 1208  HGB 8.8*  --   --  9.5*  --   HCT 32.1*  --   --  34.5*  --   PLT 882*  --   --  886*  --   HEPARINUNFRC  --    < > 0.13* 0.19* <0.10*  CREATININE 0.59  --   --  0.52  --    < > = values in this interval not displayed.    Estimated Creatinine Clearance: 96.8 mL/min (by C-G formula based on SCr of 0.52 mg/dL).  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  Heparin level dropped to undetectable this afternoon after rate was increased earlier this morning for level 0.19. Per discussion with RN, received a call while he was at lunch that the drip was "beeping." He reports may have been off for short period of time (estimated between 5-20 minutes) and resumed around 1210. Will repeat level to confirm.  CBC stable. No bleeding issues reported.  Goal of Therapy:  Heparin level 0.3-0.5 units/mL Monitor platelets by anticoagulation protocol: Yes   Plan:  Continue heparin at 2400 units/hr for now with unclear off time prior to level being drawn Check 6hr repeat heparin level at 1800 Monitor daily CBC, s/sx bleeding   31, PharmD, BCPS Please check AMION for all [facility] contact numbers Clinical Pharmacist 07/21/2020 1:25 PM

## 2020-07-21 NOTE — Plan of Care (Signed)
  Problem: Education: Goal: Knowledge of General Education information will improve Description Including pain rating scale, medication(s)/side effects and non-pharmacologic comfort measures Outcome: Progressing   Problem: Health Behavior/Discharge Planning: Goal: Ability to manage health-related needs will improve Outcome: Progressing   Problem: Clinical Measurements: Goal: Will remain free from infection Outcome: Progressing   Problem: Nutrition: Goal: Adequate nutrition will be maintained Outcome: Progressing   Problem: Coping: Goal: Level of anxiety will decrease Outcome: Progressing   

## 2020-07-21 NOTE — Progress Notes (Signed)
Pt's CBG at 0022 07/21/2020 (132) with glucometer 3WC 2. This CBG reading has not populated.

## 2020-07-21 NOTE — Progress Notes (Signed)
ANTICOAGULATION CONSULT NOTE  Pharmacy Consult for Heparin Indication: RUE occlusion  Allergies  Allergen Reactions  . Norco [Hydrocodone-Acetaminophen] Itching, Nausea And Vomiting and Swelling    Patient Measurements: Height: 5\' 5"  (165.1 cm) Weight: 72.5 kg (159 lb 13.3 oz) IBW/kg (Calculated) : 57  Vital Signs: Temp: 98 F (36.7 C) (04/09 0021) Temp Source: Oral (04/09 0021) BP: 135/68 (04/09 0021) Pulse Rate: 97 (04/09 0021)  Labs: Recent Labs    07/18/20 0428 07/18/20 0941 07/20/20 0405 07/20/20 0756 07/20/20 1122 07/20/20 1856 07/21/20 0224  HGB 10.0*  --  8.8*  --   --   --  9.5*  HCT 36.2  --  32.1*  --   --   --  34.5*  PLT 739*  --  882*  --   --   --  886*  HEPARINUNFRC 0.43   < >  --    < > <0.10* 0.13* 0.19*  CREATININE 0.66  --  0.59  --   --   --   --    < > = values in this interval not displayed.    Estimated Creatinine Clearance: 97.9 mL/min (by C-G formula based on SCr of 0.59 mg/dL).  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    31, PharmD, BCPS  07/21/2020,3:50 AM

## 2020-07-22 ENCOUNTER — Encounter (HOSPITAL_COMMUNITY): Payer: Self-pay | Admitting: Cardiology

## 2020-07-22 DIAGNOSIS — I6601 Occlusion and stenosis of right middle cerebral artery: Secondary | ICD-10-CM | POA: Diagnosis not present

## 2020-07-22 LAB — GLUCOSE, CAPILLARY
Glucose-Capillary: 114 mg/dL — ABNORMAL HIGH (ref 70–99)
Glucose-Capillary: 120 mg/dL — ABNORMAL HIGH (ref 70–99)
Glucose-Capillary: 120 mg/dL — ABNORMAL HIGH (ref 70–99)
Glucose-Capillary: 130 mg/dL — ABNORMAL HIGH (ref 70–99)
Glucose-Capillary: 139 mg/dL — ABNORMAL HIGH (ref 70–99)
Glucose-Capillary: 140 mg/dL — ABNORMAL HIGH (ref 70–99)
Glucose-Capillary: 88 mg/dL (ref 70–99)

## 2020-07-22 LAB — BASIC METABOLIC PANEL
Anion gap: 9 (ref 5–15)
BUN: 6 mg/dL (ref 6–20)
CO2: 26 mmol/L (ref 22–32)
Calcium: 9.3 mg/dL (ref 8.9–10.3)
Chloride: 103 mmol/L (ref 98–111)
Creatinine, Ser: 0.52 mg/dL (ref 0.44–1.00)
GFR, Estimated: >60 mL/min (ref >60)
Glucose, Bld: 71 mg/dL (ref 70–99)
Potassium: 4 mmol/L (ref 3.5–5.1)
Sodium: 138 mmol/L (ref 135–145)

## 2020-07-22 LAB — CBC
HCT: 32.4 % — ABNORMAL LOW (ref 36.0–46.0)
Hemoglobin: 8.9 g/dL — ABNORMAL LOW (ref 12.0–15.0)
MCH: 21.7 pg — ABNORMAL LOW (ref 26.0–34.0)
MCHC: 27.5 g/dL — ABNORMAL LOW (ref 30.0–36.0)
MCV: 79 fL — ABNORMAL LOW (ref 80.0–100.0)
Platelets: 893 10*3/uL — ABNORMAL HIGH (ref 150–400)
RBC: 4.1 MIL/uL (ref 3.87–5.11)
RDW: 26.1 % — ABNORMAL HIGH (ref 11.5–15.5)
WBC: 13.7 10*3/uL — ABNORMAL HIGH (ref 4.0–10.5)
nRBC: 0 % (ref 0.0–0.2)

## 2020-07-22 LAB — HEPARIN LEVEL (UNFRACTIONATED)
Heparin Unfractionated: 0.26 IU/mL — ABNORMAL LOW (ref 0.30–0.70)
Heparin Unfractionated: 0.29 IU/mL — ABNORMAL LOW (ref 0.30–0.70)

## 2020-07-22 MED ORDER — GABAPENTIN 250 MG/5ML PO SOLN
600.0000 mg | Freq: Three times a day (TID) | ORAL | Status: DC
  Administered 2020-07-22 – 2020-08-03 (×34): 600 mg
  Filled 2020-07-22 (×45): qty 12

## 2020-07-22 MED ORDER — METOPROLOL TARTRATE 50 MG PO TABS
50.0000 mg | ORAL_TABLET | Freq: Two times a day (BID) | ORAL | Status: DC
  Administered 2020-07-22 – 2020-08-03 (×23): 50 mg
  Filled 2020-07-22 (×23): qty 1

## 2020-07-22 NOTE — Progress Notes (Signed)
Subjective: NAEs o/n  Objective: Vital signs in last 24 hours: Temp:  [98 F (36.7 C)-99.1 F (37.3 C)] 98 F (36.7 C) (04/10 0801) Pulse Rate:  [97-106] 97 (04/10 0801) Resp:  [16-20] 16 (04/10 0801) BP: (126-148)/(77-84) 148/77 (04/10 0801) SpO2:  [96 %-100 %] 100 % (04/10 0801)  Intake/Output from previous day: No intake/output data recorded. Intake/Output this shift: Total I/O In: 2340.4 [I.V.:2290.4; NG/GT:50] Out: -   Awake, alert, speech dysarthric Flap soft, slightly full Abd incision healing well  Lab Results: Recent Labs    07/21/20 0224 07/22/20 0232  WBC 14.8* 13.7*  HGB 9.5* 8.9*  HCT 34.5* 32.4*  PLT 886* 893*   BMET Recent Labs    07/21/20 0224 07/22/20 0232  NA 139 138  K 3.4* 4.0  CL 105 103  CO2 21* 26  GLUCOSE 62* 71  BUN 5* 6  CREATININE 0.52 0.52  CALCIUM 9.1 9.3    Studies/Results: No results found.  Assessment/Plan: S/p hemicraniectomy for stroke - cont supportive care    Bedelia Person 07/22/2020, 10:34 AM

## 2020-07-22 NOTE — Progress Notes (Addendum)
ANTICOAGULATION CONSULT NOTE - Follow Up Consult  Pharmacy Consult for Heparin Indication: RUE occlusion  Allergies  Allergen Reactions  . Norco [Hydrocodone-Acetaminophen] Itching, Nausea And Vomiting and Swelling    Patient Measurements: Height: 5\' 5"  (165.1 cm) Weight: 70.7 kg (155 lb 13.8 oz) IBW/kg (Calculated) : 57  Vital Signs: Temp: 99.1 F (37.3 C) (04/09 2000) Temp Source: Oral (04/09 2000) BP: 137/80 (04/09 2000) Pulse Rate: 106 (04/09 2000)  Labs: Recent Labs    07/20/20 0405 07/20/20 0756 07/21/20 0224 07/21/20 1208 07/21/20 1754 07/22/20 0232  HGB 8.8*  --  9.5*  --   --  8.9*  HCT 32.1*  --  34.5*  --   --  32.4*  PLT 882*  --  886*  --   --  893*  HEPARINUNFRC  --    < > 0.19* <0.10* 0.40 0.26*  CREATININE 0.59  --  0.52  --   --  0.52   < > = values in this interval not displayed.    Estimated Creatinine Clearance: 96.8 mL/min (by C-G formula based on SCr of 0.52 mg/dL).  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 Heparin level this morning is slightly below goal at 0.26 after being at goal last night. CBC and platelets are stable. Spoke with RN and no issues with the heparin drip overnight and no signs of bleeding. Will increase the drip slightly and re-check a 6 hour level.   Noted that eventual plan for transition to oral anticoagulation but awaiting potential placement of PEG tube.    Goal of Therapy:  Heparin level 0.3-0.5 units/mL Monitor platelets by anticoagulation protocol: Yes   Plan:  Increase heparin slightly to 2450 units/hr Check 6 hour level   Daily heparin level and CBC, s/sx of bleeding   Addendum: Level still slightly low at 0.29 this afternoon- Increase heparin to 2500 units/hr Re-check AM heparin level    31, PharmD, BCPS, BCIDP Infectious Diseases Clinical Pharmacist Phone: [phone] Please check AMION for all [facility] Pharmacy numbers 07/22/2020

## 2020-07-22 NOTE — Progress Notes (Signed)
STROKE TEAM PROGRESS NOTE   INTERVAL HISTORY Patient is lying in bed appears comfortable.  She follows simple commands.  Continues to have significant left hemineglect and hemiplegia.  She complains of right hand pain. She remains slightly tachycardic.  White count is stable.  Afebrile. neuro exam is stable and unchanged.  Vital signs are stable except she remains mildly tachycardic.  She remains on heparin drip for right ulnar artery occlusion with plans to transition to oral anticoagulation and discharge.    Vitals:   07/21/20 1604 07/21/20 2000 07/22/20 0801 07/22/20 1242  BP: 126/83 137/80 (!) 148/77 140/79  Pulse: (!) 102 (!) 106 97 (!) 103  Resp: 17 20 16 18   Temp: 98.5 F (36.9 C) 99.1 F (37.3 C) 98 F (36.7 C) 99 F (37.2 C)  TempSrc: Oral Oral Oral Oral  SpO2: 97% 96% 100% 100%  Weight:      Height:       CBC:  Recent Labs  Lab 07/21/20 0224 07/22/20 0232  WBC 14.8* 13.7*  HGB 9.5* 8.9*  HCT 34.5* 32.4*  MCV 79.3* 79.0*  PLT 886* 893*   Basic Metabolic Panel:  Recent Labs  Lab 07/21/20 0224 07/22/20 0232  NA 139 138  K 3.4* 4.0  CL 105 103  CO2 21* 26  GLUCOSE 62* 71  BUN 5* 6  CREATININE 0.52 0.52  CALCIUM 9.1 9.3   Lipid Panel:  No results for input(s): CHOL, TRIG, HDL, CHOLHDL, VLDL, LDLCALC in the last 168 hours. HgbA1c: No results for input(s): HGBA1C in the last 168 hours. Urine Drug Screen: No results for input(s): LABOPIA, COCAINSCRNUR, LABBENZ, AMPHETMU, THCU, LABBARB in the last 168 hours.  Alcohol Level No results for input(s): ETH in the last 168 hours.  IMAGING past 24 hours CT HEAD WO CONTRAST  Result Date: 07/14/2020  IMPRESSION:  1. Evolving right MCA territory infarct with slightly decreased swelling and midline shift.  2. Improved sinus inflammation. 3. No new abnormality.   PHYSICAL EXAM Blood pressure 140/79, pulse (!) 103, temperature 99 F (37.2 C), temperature source Oral, resp. rate 18, height 5\' 5"  (1.651 m), weight 70.7  kg, SpO2 100 %.  General: caucasian female, lying in bed in NAD  Lungs: Symmetrical Chest rise, no labored breathing  Cardio: Regular Rate and Rhythm  Abdomen: Soft, non-tender  Skin: right thumb mild discoloration with tenderness to touch, right fingers less cool, dry eschar wound on the palmar region unchanged with tender to touch.  Neuro - Awake, alert, eyes open,alert with psychomotor slowing,oriented to place, self, month.  No aphasiabut paucity of speech, moderate to severe dysarthria, followingmost of thesimple commands. Increased verbal response today. Speaking in full sentences and appropriately in context of conversation. Right gaze preference, not cross midline, trackingon the right,not blinking to visual threat on the left, PERRL.Left facial droop. Tongue midline.RUE and RLE spontaneous movement against gravity and LUE flaccid and LLE slight withdraw to pain.Sensation and coordination not cooperative, gait not tested.   ASSESSMENT/PLAN [name] is a 35 y.o. female with history of uncontrolled diabetesandhypertensionpresenting with left sided weakness and slurred speech.Per family there is a complicated social situation at this time. She lives intermittently in an apartment by herself but was kicked out and they went to pick her up at 5:30 AM. At that point she seemed "off"and generally quiet but did not seem to have any difficulty with left-sided weakness.Family attributed this to psychosocial issues and potential substance use noting that her sister passed away  from an overdose in October. Later in the morning she complained of diffuse body pain and was administered gabapentin and a muscle relaxer.At 11:30 AM she stood up and walkedto the showerand at1215 she was found on the bottom of the tub with slurred speech and difficulty moving the left side. She was not preactivated by EMS, but on arrival in the ED clearly appear to have a doorway examination  consistent with a right MCA territory infarct.CTA revealed right ICA occlusion as well as a right common carotid bifurcation thrombus extending into the external carotid (felt to be the source of the occlusion). CT perfusion imaging demonstrated possible core infarct of 51 mL (though notably this was obtained within 2 hours of symptom onset and therefore may reflect an overestimate of the true core). Mismatch ratio was 2 with a penumbra of 63 mL. Case was discussed with Dr. Corliss Skains of neuro interventional radiology and Dr. Otelia Limes of [facility] and the patient was accepted for transfer to [facility] for emergent intervention. She was also started on TPA after consenting the patient's emergency contact with full discussion of risks of bleeding and importance of accurately determining the patient's last known well.[name] confirm the last known well as described above and agreed that the patient would want TPA given the risk/benefit.She confirmed that the patient had no recent procedures or other contraindications to TPA.Regarding her baseline, she is functional at baseline and family believes she is smoking at least1pack a day currently. She is on Metformin for her diabetes, does not take oral contraceptive pills, has had no recent surgeries or procedures.   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   CT no acute finding but suspicious for right MCA early ischemic changes.   CTA head and neck showed right ICA terminal and proximal MCA occlusion, right CCA nonocclusive thrombus.  CT perfusion showed large penumbra.   Status post thrombectomy with right terminal ICA, right  PCA and right ACA TICI2breperfusion however does have distal micro emboli.  MRI showed large right MCA infarct with 3mm MLS and hemorrhagic transformation.   MRA showed patent intracranial vessels.  CT Head WO Contrast 3/26 620 - Improved midline shift and normalized left lateral ventricular volume after decompressive craniectomy. No progressive infarct or hemorrhage.  2D Echo- EF: 60-65%. No wall motion abnormality.  TCD Bubble - No PFO  TEE today no source of emboli, no PFO  DVT US -No evidence of DVT bilaterally  LDL99  HgbA1c10.6  VTE prophylaxis -heparin IV  No antithromboticprior to admission, now on heparin IV for right ulnar artery occlusion, pharmacy managed  Therapy recommendations:CIR however no family support so SNF is working plan  Disposition:SNF due to lack of family support  Cerebral edema  CT Headrepeat3/24 -Continued interval evolution of right MCA distribution infarct with associated hemorrhagic transformation. Worsened regional mass effect and edema with increased right-to-left midline shift now measuring up to 9 mm.Increased dilatation of the right lateral ventricle concerning for developing ventricular trapping. No other new acute intracranial abnormality.  CT repeat 3/25 unchangedright cerebral cytotoxic edema with central hemorrhage. Midline shift is similar at 1 cm.Unchanged left lateral ventricle dilatation with evidence of transependymal flow.  S/pdecompressive hemicraniectomy (3/25)with Dr. Venetia Maxon  CT Head WO Contrast 3/26 620 - Improved midline  shift and normalized left lateral ventricular volume after decompressive craniectomy. No progressive infarct or hemorrhage.  On keppra 500 mg bid  3% salineoff  Na 137->141->147->150>158>150> 141>139>140>139  Staple removal completed on 4/8  Fever, resolved Leukocytosis, improving  Tmax 101.7->102.5> 98.6> 98.7>99.9>afebrile  WBC -  16.2>15.7>16.8>18.6->21.5->19.6->14.8  TEE no endocarditis  UA (4/3) neg  CXR4/4-left perihilar atelectasis  CXR 4/7 No acute findings   Abd Xray 4/7 No acute findings  Respiratory Culture (3/26) - Strep Pneumoniae  Blood CxNo growth after5days drawn 3/25  Completed 7 day course of Ceftriaxone on 4/2  ID on board, do not think endocarditis.  Tachycardia and tachypnea Leukocytosis  WBC - 16.2>15.7>16.8>18.6->21.5->19.6->14.8  Likely related to ischemic right hand  on metoprolol 25mg  bid   UA: negative  ABG: ph 7.4 pCO2 40 pao2 155    LE venous doppler repeat (4/3): No evidence of deep vein thrombosis   ID on board, do not consider endocarditis  R critical limb ischemia  S/p thrombectomy (3/24) for right brachial artery occlusion  Now Right hand pain, cool to touch, dry eschar wound on the palmar region, decreased dopplers per RN, and tachycardia, tachypnea with leukocytosis  UE arterial duplex (4/3) - right ulnar artery occlusion.   VVS Dr. Chestine Sporelark - No surgical intervention at this time.  Recommends medical management with Heparin infusion.    Discussed with Dr. Yetta BarreJones with Neurosurgery ok'd the initiation of Heparin  Heparin infusion started on 4/3. Plan to transition to anticoagulation at discharge assuming no other issues per Dr. Chestine Sporelark.  Percocet 5/325 q 6hr prn for right hand pain  Hyperlipidemia  Home meds:none  LDL 99, goal < 70  On lipitor 40 now  Continue statin at discharge  Poorly controlled Diabetes type II, with hyperglycemia   Home meds:Metformin, Glargine, Aspart  HgbA1c 10.6, goal < 7.0  CBGs  SSI  Levemir to 35 units bid  Novolog 6 units every 4 hours  Dysphagia  Feeding via cortrak  cortrak removed during TEE today  SLP following, NPO continues after barium swallow today.    S/p Cortrak 4/8 - on TF @ 50 and IVF @ 25  My need to consider PEG as she is not progressing with speech.   Speech following next  week for repeat eval  Cocaine abuse  UDS positive for cocaine  Cessation education will be provided  Constipation  On bowel regimen  Last BMP 4/2  Dulcolax suppository after dinner tonight if no BM       by then   Sick thyroid   TSH: 0.307 (Low) Free T4:1.27 (high)  On metoprolol 25 bid  Repeat TSH and FT4 are WNL  IDA  BID Ferrous sulfate  4/8 Iron level low at 21, Ferritin and TIBC wnl   Tobacco abuse  Current smoker  Smoking cessation counselingwill beprovided  Other Stroke Risk Factors  Substance abuse - UDS: THC POSITIVEWill advise patientto stop using due to stroke risk.  Other Active Problems    Hospital day # 18     Neurological exam is unchanged and stable.     Patient tachycardia improved after low-dose metoprolol plan to increase dose.  Leukocytosis continues to improve.  BMP is unremarkable..  Add gabapentin for arm pain. Continue IV heparin but, heparin IV can be transitioned to DOAC per vascular surgery recommendations after she is able to swallow or has a PEG tube.  No family available at the bedside greater than 50% time during this 25-minute visit was spent in counseling and  coordination of care and discussion with care team. Delia Heady, MD 07/22/2020 1:17 PM   To contact Stroke Continuity provider, please refer to [email or web address]. After hours, contact General Neurology

## 2020-07-22 NOTE — Plan of Care (Signed)
  Problem: Education: Goal: Knowledge of General Education information will improve Description Including pain rating scale, medication(s)/side effects and non-pharmacologic comfort measures Outcome: Progressing   Problem: Health Behavior/Discharge Planning: Goal: Ability to manage health-related needs will improve Outcome: Progressing   

## 2020-07-23 DIAGNOSIS — I6601 Occlusion and stenosis of right middle cerebral artery: Secondary | ICD-10-CM | POA: Diagnosis not present

## 2020-07-23 LAB — BASIC METABOLIC PANEL
Anion gap: 7 (ref 5–15)
BUN: 6 mg/dL (ref 6–20)
CO2: 28 mmol/L (ref 22–32)
Calcium: 9.4 mg/dL (ref 8.9–10.3)
Chloride: 102 mmol/L (ref 98–111)
Creatinine, Ser: 0.74 mg/dL (ref 0.44–1.00)
GFR, Estimated: >60 mL/min (ref >60)
Glucose, Bld: 108 mg/dL — ABNORMAL HIGH (ref 70–99)
Potassium: 4 mmol/L (ref 3.5–5.1)
Sodium: 137 mmol/L (ref 135–145)

## 2020-07-23 LAB — GLUCOSE, CAPILLARY
Glucose-Capillary: 90 mg/dL (ref 70–99)
Glucose-Capillary: 149 mg/dL — ABNORMAL HIGH (ref 70–99)
Glucose-Capillary: 132 mg/dL — ABNORMAL HIGH (ref 70–99)
Glucose-Capillary: 210 mg/dL — ABNORMAL HIGH (ref 70–99)
Glucose-Capillary: 121 mg/dL — ABNORMAL HIGH (ref 70–99)
Glucose-Capillary: 164 mg/dL — ABNORMAL HIGH (ref 70–99)

## 2020-07-23 LAB — CBC
HCT: 35.4 % — ABNORMAL LOW (ref 36.0–46.0)
Hemoglobin: 9.9 g/dL — ABNORMAL LOW (ref 12.0–15.0)
MCH: 21.7 pg — ABNORMAL LOW (ref 26.0–34.0)
MCHC: 28 g/dL — ABNORMAL LOW (ref 30.0–36.0)
MCV: 77.6 fL — ABNORMAL LOW (ref 80.0–100.0)
Platelets: 915 10*3/uL (ref 150–400)
RBC: 4.56 MIL/uL (ref 3.87–5.11)
RDW: 25.7 % — ABNORMAL HIGH (ref 11.5–15.5)
WBC: 12.9 10*3/uL — ABNORMAL HIGH (ref 4.0–10.5)
nRBC: 0 % (ref 0.0–0.2)

## 2020-07-23 LAB — CULTURE, BLOOD (ROUTINE X 2)
Culture: NO GROWTH
Culture: NO GROWTH
Special Requests: ADEQUATE
Special Requests: ADEQUATE

## 2020-07-23 LAB — PATHOLOGIST SMEAR REVIEW

## 2020-07-23 LAB — HEPARIN LEVEL (UNFRACTIONATED)
Heparin Unfractionated: 0.44 IU/mL (ref 0.30–0.70)
Heparin Unfractionated: 0.19 IU/mL — ABNORMAL LOW (ref 0.30–0.70)
Heparin Unfractionated: 0.36 IU/mL (ref 0.30–0.70)

## 2020-07-23 NOTE — Plan of Care (Signed)
  Problem: Education: Goal: Knowledge of General Education information will improve Description: Including pain rating scale, medication(s)/side effects and non-pharmacologic comfort measures Outcome: Progressing   Problem: Clinical Measurements: Goal: Ability to maintain clinical measurements within normal limits will improve Outcome: Progressing Goal: Will remain free from infection Outcome: Progressing Goal: Diagnostic test results will improve Outcome: Progressing Goal: Respiratory complications will improve Outcome: Progressing Goal: Cardiovascular complication will be avoided Outcome: Progressing   Problem: Safety: Goal: Ability to remain free from injury will improve Outcome: Progressing   Problem: Ischemic Stroke/TIA Tissue Perfusion: Goal: Complications of ischemic stroke/TIA will be minimized Outcome: Progressing

## 2020-07-23 NOTE — Plan of Care (Signed)
  Problem: Education: Goal: Knowledge of General Education information will improve Description: Including pain rating scale, medication(s)/side effects and non-pharmacologic comfort measures Outcome: Progressing   Problem: Health Behavior/Discharge Planning: Goal: Ability to manage health-related needs will improve Outcome: Progressing   Problem: Clinical Measurements: Goal: Ability to maintain clinical measurements within normal limits will improve Outcome: Progressing Goal: Will remain free from infection Outcome: Progressing Goal: Diagnostic test results will improve Outcome: Progressing Goal: Respiratory complications will improve Outcome: Progressing Goal: Cardiovascular complication will be avoided Outcome: Progressing   Problem: Activity: Goal: Risk for activity intolerance will decrease Outcome: Progressing   Problem: Nutrition: Goal: Adequate nutrition will be maintained Outcome: Progressing   Problem: Coping: Goal: Level of anxiety will decrease Outcome: Progressing   Problem: Elimination: Goal: Will not experience complications related to bowel motility Outcome: Progressing Goal: Will not experience complications related to urinary retention Outcome: Progressing   Problem: Pain Managment: Goal: General experience of comfort will improve Outcome: Progressing   Problem: Safety: Goal: Ability to remain free from injury will improve Outcome: Progressing   Problem: Skin Integrity: Goal: Risk for impaired skin integrity will decrease Outcome: Progressing   Problem: Education: Goal: Knowledge of disease or condition will improve Outcome: Progressing Goal: Knowledge of secondary prevention will improve Outcome: Progressing Goal: Knowledge of patient specific risk factors addressed and post discharge goals established will improve Outcome: Progressing   Problem: Coping: Goal: Will verbalize positive feelings about self Outcome: Progressing Goal: Will  identify appropriate support needs Outcome: Progressing   Problem: Health Behavior/Discharge Planning: Goal: Ability to manage health-related needs will improve Outcome: Progressing   Problem: Self-Care: Goal: Ability to participate in self-care as condition permits will improve Outcome: Progressing Goal: Verbalization of feelings and concerns over difficulty with self-care will improve Outcome: Progressing Goal: Ability to communicate needs accurately will improve Outcome: Progressing   Problem: Nutrition: Goal: Risk of aspiration will decrease Outcome: Progressing Goal: Dietary intake will improve Outcome: Progressing   Problem: Ischemic Stroke/TIA Tissue Perfusion: Goal: Complications of ischemic stroke/TIA will be minimized Outcome: Progressing   

## 2020-07-23 NOTE — Progress Notes (Signed)
Physical Therapy Treatment Patient Details Name: Brandi Myers MRN: [id number] DOB: Dec 28, 1985 Today's Date: 07/23/2020    History of Present Illness 35 yo female admitted with L sided weakness givent TPA for R MCA involving basal ganglia frontal temporal lobes  3/23 s/p BIL revascularization  R MCA ACA achieving 2B revascularization PT intubated starting 3/23 . 3/24 R UE thromectomy CT reveals worsening midline shift start 3% saline 3/25 decompressive hemicranitectomy R side bone flap in abdomen 3/25 fever 103. Extubated 3/31. PMH current smoker uncontrolled DM HTn    PT Comments    Pt progressing slowly towards physical therapy goals. When PT arrived, pt with LE's over side rail and very restless in the bed. RN reports pt was just repositioned not long ago. Pt with limited engagement in session as she is internally distracted and intermittently following commands. 2-3 finger width L shoulder subluxation noted with pt in sitting. Discussed with RN after session that a veil bed may be beneficial for her if she continues to be restless and attempting to get LE's up over railings while laying in bed. Will continue to follow and progress as able per POC.    Follow Up Recommendations  SNF (CIR denied pt)     Equipment Recommendations  Wheelchair (measurements PT);Wheelchair cushion (measurements PT);Hospital bed (mechanical lift)    Recommendations for Other Services       Precautions / Restrictions Precautions Precautions: Fall Precaution Comments: bone flap out in R abdomen, L hemiparesis, cortrak, helmet with activity Restrictions Weight Bearing Restrictions: No    Mobility  Bed Mobility Overal bed mobility: Needs Assistance Bed Mobility: Supine to Sit;Sit to Supine Rolling: Max assist   Supine to sit: Total assist;+2 for physical assistance;+2 for safety/equipment Sit to supine: Total assist;+2 for physical assistance;+2 for safety/equipment   General bed mobility comments:  overall requires total assist for transtion to/from EOB, patient following minimal commands with poor attention to task.  Hand over hand support to functionally use RUE, cueing to attend to L side, and poor initation/sequencing to complete bed mobility.    Transfers                 General transfer comment: defered d/t safety  Ambulation/Gait                 Stairs             Wheelchair Mobility    Modified Rankin (Stroke Patients Only) Modified Rankin (Stroke Patients Only) Pre-Morbid Rankin Score: No symptoms Modified Rankin: Severe disability     Balance Overall balance assessment: Needs assistance Sitting-balance support: Single extremity supported;Feet supported Sitting balance-Leahy Scale: Zero Sitting balance - Comments: total assist with brief 5-10 second bouts of initiating anterior lean to mighter max assist. Postural control: Left lateral lean                                  Cognition Arousal/Alertness: Awake/alert Behavior During Therapy: Flat affect;Impulsive;Restless Overall Cognitive Status: Impaired/Different from baseline Area of Impairment: Attention;Following commands;Problem solving;Awareness;Safety/judgement;Memory;Orientation                 Orientation Level: Disoriented to;Place;Time;Situation Current Attention Level: Focused Memory: Decreased short-term memory;Decreased recall of precautions Following Commands: Follows one step commands inconsistently Safety/Judgement: Decreased awareness of safety;Decreased awareness of deficits Awareness: Intellectual Problem Solving: Slow processing;Decreased initiation;Difficulty sequencing;Requires verbal cues;Requires tactile cues General Comments: Patient reports being at her "grandmothers house", no awareness  to situation. Asking to "get rid of the snake", with further questioning "the snake in my nose".  She follows 1 step commands inconsistently but requires  constant redirection to task, unable to sustain task attention for purposeful use of R UE. L inattention.      Exercises General Exercises - Lower Extremity Long Arc Quad: Strengthening;Right;Seated;5 reps (no activation on L noted with palpation when cued) Other Exercises Other Exercises: PROM to L UE from shoulder (to 90FF) to hand, remains flaccid    General Comments General comments (skin integrity, edema, etc.): Pt with 2-3 finger width L shoulder subluxation. Repositioned UE on pillow for increased support.      Pertinent Vitals/Pain Pain Assessment: Faces Faces Pain Scale: Hurts little more Pain Location: R wrist/hand Pain Descriptors / Indicators: Guarding (hyperfocused) Pain Intervention(s): Limited activity within patient's tolerance;Monitored during session;Repositioned    Home Living                      Prior Function            PT Goals (current goals can now be found in the care plan section) Acute Rehab PT Goals Patient Stated Goal: to walk PT Goal Formulation: With patient Time For Goal Achievement: 08/06/20 Potential to Achieve Goals: Fair Progress towards PT goals: Progressing toward goals    Frequency    Min 3X/week      PT Plan Frequency needs to be updated    Co-evaluation PT/OT/SLP Co-Evaluation/Treatment: Yes Reason for Co-Treatment: Complexity of the patient's impairments (multi-system involvement);Necessary to address cognition/behavior during functional activity;For patient/therapist safety;To address functional/ADL transfers PT goals addressed during session: Mobility/safety with mobility;Balance;Proper use of DME;Strengthening/ROM OT goals addressed during session: ADL's and self-care      AM-PAC PT "6 Clicks" Mobility   Outcome Measure  Help needed turning from your back to your side while in a flat bed without using bedrails?: A Lot Help needed moving from lying on your back to sitting on the side of a flat bed without  using bedrails?: Total Help needed moving to and from a bed to a chair (including a wheelchair)?: Total Help needed standing up from a chair using your arms (e.g., wheelchair or bedside chair)?: Total Help needed to walk in hospital room?: Total Help needed climbing 3-5 steps with a railing? : Total 6 Click Score: 7    End of Session Equipment Utilized During Treatment: Other (comment) (helmet) Activity Tolerance: Patient tolerated treatment well Patient left: in bed;with bed alarm set;with call bell/phone within reach Nurse Communication: Mobility status;Need for lift equipment;Other (comment) (May benefit from veil bed) PT Visit Diagnosis: Other abnormalities of gait and mobility (R26.89);Hemiplegia and hemiparesis;Other symptoms and signs involving the nervous system (R29.898);Muscle weakness (generalized) (M62.81);Difficulty in walking, not elsewhere classified (R26.2) Hemiplegia - Right/Left: Left Hemiplegia - dominant/non-dominant: Non-dominant Hemiplegia - caused by: Cerebral infarction     Time: 3785-8850 PT Time Calculation (min) (ACUTE ONLY): 28 min  Charges:  $Therapeutic Activity: 8-22 mins                     Conni Slipper, PT, DPT Acute Rehabilitation Services Pager: [phone] Office: [phone]    Marylynn Pearson 07/23/2020, 12:34 PM

## 2020-07-23 NOTE — Progress Notes (Signed)
ANTICOAGULATION CONSULT NOTE - Follow Up Consult  Pharmacy Consult for Heparin Indication: RUE occlusion  Allergies  Allergen Reactions  . Norco [Hydrocodone-Acetaminophen] Itching, Nausea And Vomiting and Swelling    Patient Measurements: Height: 5\' 5"  (165.1 cm) Weight: 69.7 kg (153 lb 10.6 oz) IBW/kg (Calculated) : 57  Vital Signs: Temp: 98.2 F (36.8 C) (04/11 1137) Temp Source: Axillary (04/11 1137) BP: 142/86 (04/11 1137) Pulse Rate: 102 (04/11 1137)  Labs: Recent Labs    07/21/20 0224 07/21/20 1208 07/22/20 0232 07/22/20 1439 07/23/20 0402 07/23/20 1431  HGB 9.5*  --  8.9*  --  9.9*  --   HCT 34.5*  --  32.4*  --  35.4*  --   PLT 886*  --  893*  --  915*  --   HEPARINUNFRC 0.19*   < > 0.26* 0.29* 0.44 0.19*  CREATININE 0.52  --  0.52  --  0.74  --    < > = values in this interval not displayed.    Estimated Creatinine Clearance: 96.2 mL/min (by C-G formula based on SCr of 0.74 mg/dL).  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  Heparin level this morning is therapeutic for goal at 0.44.  H/H is stable. Platelets are elevated and trending up at 915.  No bleeding or issues with infusion noted.   Noted that eventual plan for transition to oral anticoagulation but awaiting potential placement of PEG tube.   Confirmatory heparin level this afternoon 0.19 is subtherapeutic on heparin 2500 units/hr after being therapeutic (0.44) this morning. RN reports pt is moving around and was possibly laying on IV but RN checked IV access now and no issues with flushing. No bleeding noted.   Goal of Therapy:  Heparin level 0.3-0.5 units/mL Monitor platelets by anticoagulation protocol: Yes   Plan:  Continue heparin 2500 units/hr and recheck 6 hr heparin level given significantly unexpected decrease and possibly incorrect level  Daily heparin level and CBC, s/sx of bleeding  Follow-up plans for PEG   31, PharmD Clinical  Pharmacist  07/23/2020

## 2020-07-23 NOTE — Progress Notes (Addendum)
STROKE TEAM PROGRESS NOTE   INTERVAL HISTORY Patient lying in bed with right gaze preference. Patient shakes her head when asked if there is something she is looking at outside her room. Patient is minimally verbal, but does interact in the exam. Patient labs are concerning for worsening thrombocytosis but leukocytosis is improving.   Vitals:   07/22/20 2316 07/23/20 0336 07/23/20 0541 07/23/20 0814  BP: 127/78 137/85  127/72  Pulse: 98 (!) 102  (!) 109  Resp: 18 18  18   Temp: 100.3 F (37.9 C) 98.9 F (37.2 C)  98.4 F (36.9 C)  TempSrc: Oral Axillary  Axillary  SpO2: 95% 97%  95%  Weight:   69.7 kg   Height:       CBC:  Recent Labs  Lab 07/22/20 0232 07/23/20 0402  WBC 13.7* 12.9*  HGB 8.9* 9.9*  HCT 32.4* 35.4*  MCV 79.0* 77.6*  PLT 893* 915*   Basic Metabolic Panel:  Recent Labs  Lab 07/22/20 0232 07/23/20 0402  NA 138 137  K 4.0 4.0  CL 103 102  CO2 26 28  GLUCOSE 71 108*  BUN 6 6  CREATININE 0.52 0.74  CALCIUM 9.3 9.4   PHYSICAL EXAM  General: caucasian female, lying in bed in NAD, gazing to the right  Lungs: Symmetrical Chest rise, no labored breathing  Cardio: Regular Rate and Rhythm  Abdomen: Soft, non-tender  Skin: right thumb mild discoloration with tenderness to touch, right fingers less cool,dry eschar wound on the palmar region unchanged with tender to touch.  Neuro - Awake, alert, eyes open,alert with psychomotor slowing,oriented to self. Patient refuses to believe she is in the hospital and says she is at her grandmother's house and dose not believe it is 2022. No aphasiabut paucity of speech, moderate to severe dysarthria, followingmost of thesimple commands. Minimal speech today. PERRL 2mm R, 4mm L.No blink to threat from the Left but blinks to threat from the right. Left facial droop.  Tongue midline.RUE and RLE spontaneous movement against gravity and LUE flaccid and LLE externally rotated.Sensation: Intact,  Extinction  positive, with neglect of both LUE and LLE.   Strength: Bulk appears normal at this time bilaterally. 3/5 in RUE and RLE. Decreased tone in LLE and and LUE. External rotation. 0/5 in LLE and LUE.  ASSESSMENT/PLAN Brandi Myers is a 35 y.o. female with history of uncontrolled diabetesandhypertensionpresenting with left sided weakness and slurred speech.Per family there is a complicated social situation at this time. She lives intermittently in an apartment by herself but was kicked out and they went to pick her up at 5:30 AM. At that point she seemed "off"and generally quiet but did not seem to have any difficulty with left-sided weakness.Family attributed this to psychosocial issues and potential substance use noting that her sister passed away from an overdose in October. Later in the morning she complained of diffuse body pain and was administered gabapentin and a muscle relaxer.At 11:30 AM she stood up and walkedto the showerand at1215 she was found on the bottom of the tub with slurred speech and difficulty moving the left side. She was not preactivated by EMS, but on arrival in the ED clearly appear to have a doorway examination consistent with a right MCA territory infarct.CTA revealed right ICA occlusion as well as a right common carotid bifurcation thrombus extending into the external carotid (felt to be the source of the occlusion). CT perfusion imaging demonstrated possible core infarct of 51 mL (though notably this  was obtained within 2 hours of symptom onset and therefore may reflect an overestimate of the true core). Mismatch ratio was 2 with a penumbra of 63 mL. Case was discussed with Dr. Corliss Skains of neuro interventional radiology and Dr. Otelia Limes of [facility] and the patient was accepted for transfer to [facility] for emergent intervention. She was also started on TPA after consenting the patient's emergency contact with full discussion of risks of bleeding and  importance of accurately determining the patient's last known well.Brandi Myers confirm the last known well as described above and agreed that the patient would want TPA given the risk/benefit.She confirmed that the patient had no recent procedures or other contraindications to TPA.Regarding her baseline, she is functional at baseline and family believes she is smoking at least1pack a day currently. She is on Metformin for her diabetes, does not take oral contraceptive pills, has had no recent surgeries or procedures. At this time await SLP recommendation for PEG vs continued therapy. Patient labs are also concerning for worsening thrombocytosis of unknown cause. This may have been the etiology behind the R ulnar artery thrombosis. Will investigate possible pharmacological cause and other sources for etiology of thrombocytosis, will also continue heparin. Consult to pharmacy placed and patient does not appear to have any medications that induce thrombocytosis. Other possible causes for patient are IDA vs myeloproloferative malignancy vs rebound reaction from craniotomy. Pathologist smear is pending. Patient's thrombocytosis increases patient's risk of another stroke. May consider a consult to heme/onc pending smear results.  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   CT no acute finding but suspicious for right MCA early ischemic changes.   CTA head and neck showed right ICA terminal and proximal MCA occlusion, right CCA nonocclusive thrombus.  CT perfusion showed large penumbra.   Status post thrombectomy with right terminal ICA, right PCA and right ACA TICI2breperfusion however  does have distal micro emboli.  MRI showed large right MCA infarct with 5mm MLS and hemorrhagic transformation.   MRA showed patent intracranial vessels.  CT Head WO Contrast 3/26 620 - Improved midline shift and normalized left lateral ventricular volume after decompressive craniectomy. No progressive infarct or hemorrhage.  2D Echo- EF: 60-65%. No wall motion abnormality.  TCD Bubble - No PFO  TEE today no source of emboli, no PFO  DVT US -No evidence of DVT bilaterally  LDL99  HgbA1c10.6  VTE prophylaxis -heparin IV  No antithromboticprior to admission, now on heparin IV for right ulnar artery occlusion, pharmacy managed  Therapy recommendations:CIR however no family support so SNF is working plan  Disposition:SNF due to lack of family support  Cerebral edema  CT Headrepeat3/24 -Continued interval evolution of right MCA distribution infarct with associated hemorrhagic transformation. Worsened regional mass effect and edema with increased right-to-left midline shift now measuring up to 9 mm.Increased dilatation of the right lateral ventricle concerning for developing ventricular trapping. No other new acute intracranial abnormality.  CT repeat 3/25 unchangedright cerebral cytotoxic edema with central hemorrhage. Midline shift is similar at 1 cm.Unchanged left lateral ventricle dilatation with evidence of transependymal flow.  S/pdecompressive hemicraniectomy (3/25)with Dr. Venetia Maxon  CT Head WO Contrast 3/26 620 - Improved midline shift and normalized left lateral ventricular volume after decompressive craniectomy. No progressive infarct or hemorrhage.  On keppra 500 mg bid  3% salineoff  Na 137->141->147->150>158>150> 141>139>140>139  Staple removal completed on 4/8  Thrombocytosis - Plt 915 today have been trending up since 4/1 - Pathology smear pending - Continue heparin - consider consult to heme/onc  Fever, resolved Leukocytosis,  improving  Tmax 101.7->102.5> 98.6> 98.7>99.9>afebrile  WBC -16.2>15.7>16.8>18.6->21.5->19.6->14.8  TEE no endocarditis  UA (4/3) neg  CXR4/4-left perihilar atelectasis  CXR 4/7 No acute findings   Abd Xray 4/7 No acute findings  Respiratory Culture (3/26) - Strep Pneumoniae  Blood CxNo growth after5days drawn 3/25  Completed 7 day course of Ceftriaxone on 4/2  ID on board, do not think endocarditis.  Tachycardia and tachypnea Leukocytosis  WBC -16.2>15.7>16.8>18.6->21.5->19.6->14.8  Likely related to ischemic right hand  on metoprolol 25mg  bid   UA: negative  ABG: ph 7.4 pCO2 40 pao2 155    LE venous doppler repeat (4/3): No evidence of deep vein thrombosis   ID on board, do not consider endocarditis  R critical limb ischemia  S/p thrombectomy (3/24) for right brachial artery occlusion  Now Right hand pain, cool to touch, dry eschar wound on the palmar region, decreased dopplers per RN, and tachycardia, tachypnea with leukocytosis  UE arterial duplex (4/3) - right ulnar artery occlusion.   VVS Dr. 09-30-1980 - No surgical intervention at this time.  Recommends medical management with Heparin infusion.    Discussed with Dr. Chestine Spore with Neurosurgery ok'd the initiation of Heparin  Heparin infusion started on 4/3. Plan to transition to anticoagulation at discharge assuming no other issues per Dr. 6/3.  Percocet 5/325 q 6hr prn for right hand pain  Hyperlipidemia  Home meds:none  LDL 99, goal < 70  On lipitor 40 now  Continue statin at discharge  Poorly controlled Diabetes type II, with hyperglycemia   Home meds:Metformin, Glargine, Aspart  HgbA1c 10.6, goal < 7.0  CBGs  SSI  Levemir to 35 units bid  Novolog 6 units every 4 hours  Dysphagia  Feeding via cortrak  cortrak removed during TEE today  SLP following, NPO continues after barium swallow today.    S/p Cortrak 4/8 - on TF @ 50 and IVF @ 25  My need to consider  PEG as she is not progressing with speech.   Speech following next week for repeat eval  Cocaine abuse  UDS positive for cocaine  Cessation education will be provided  Constipation  On bowel regimen  Last BMP 4/2  Dulcolax suppository after dinner tonight if no BM       by then   Sick thyroid   TSH: 0.307 (Low) Free T4:1.27 (high)  On metoprolol 25 bid  Repeat TSH and FT4 are WNL  IDA  BID Ferrous sulfate  4/8 Iron level low at 21, Ferritin and TIBC wnl   Tobacco abuse  Current smoker  Smoking cessation counselingwill beprovided  Other Stroke Risk Factors  Substance abuse - UDS: THC POSITIVEWill advise patientto stop using due to stroke risk.    Hospital day # 45  12, MD PGY-1 I have personally obtained history,examined this patient, reviewed notes, independently viewed imaging studies, participated in medical decision making and plan of care.ROS completed by me personally and pertinent positives fully documented  I have made any additions or clarifications directly to the above note. Agree with note above.  Patient continues to do poorly with her swallowing and speech therapy still recommends needs by mouth.  May need to consider PEG tube placement.  Discussed with speech therapist.  Greater than 50% time during this 25-minute visit was spent in counseling and coordination of care and discussion with care team.  Delia Heady, MD Medical Director [facility] Pager: [phone] 07/23/2020 3:50 PM  To contact Stroke Continuity provider, please refer to [email or web address]. After hours, contact General Neurology

## 2020-07-23 NOTE — Progress Notes (Signed)
Occupational Therapy Treatment Patient Details Name: Brandi Myers MRN: [id number] DOB: 05/22/1985 Today's Date: 07/23/2020    History of present illness 35 yo female admitted with L sided weakness givent TPA for R MCA involving basal ganglia frontal temporal lobes  3/23 s/p BIL revascularization  R MCA ACA achieving 2B revascularization PT intubated starting 3/23 . 3/24 R UE thromectomy CT reveals worsening midline shift start 3% saline 3/25 decompressive hemicranitectomy R side bone flap in abdomen 3/25 fever 103. Extubated 3/31. PMH current smoker uncontrolled DM HTn   OT comments  Patient in bed upon entry, requires max multimodal cueing to transition towards L side of bed and total assist +2 to transition to/from sitting EOB. Sitting EOB, able to sit upright with max assist for 5-10 seconds but overall requires total assist. Total assist for L lateral lean on elbow x 2.  She remains limited by cognition: orientation, attention to task, awareness, as well as balance and L sided hemiparesis. PROM to L UE and repositioned on pillow, may benefit from taping due to 2+finger width subluxation noted today. Will follow acutely.    Follow Up Recommendations  SNF;Supervision/Assistance - 24 hour    Equipment Recommendations  3 in 1 bedside commode;Wheelchair (measurements OT);Wheelchair cushion (measurements OT);Hospital bed    Recommendations for Other Services Rehab consult;Speech consult    Precautions / Restrictions Precautions Precautions: Fall Precaution Comments: bone flap out in R abdomen, L hemiparesis, cortrak, helmet with activity Restrictions Weight Bearing Restrictions: No       Mobility Bed Mobility Overal bed mobility: Needs Assistance Bed Mobility: Supine to Sit;Sit to Supine Rolling: Max assist   Supine to sit: Total assist;+2 for physical assistance;+2 for safety/equipment Sit to supine: Total assist;+2 for physical assistance;+2 for safety/equipment   General bed  mobility comments: overall requires total assist for transtion to/from EOB, patient following minimal commands with poor attention to task.  Hand over hand support to functionally use RUE, cueing to attend to L side, and poor initation/sequencing to complete bed mobility.    Transfers                 General transfer comment: defered d/t safety    Balance Overall balance assessment: Needs assistance Sitting-balance support: Single extremity supported;Feet supported Sitting balance-Leahy Scale: Zero Sitting balance - Comments: total assist with brief 5-10 second bout of max assist when sitting upright Postural control: Left lateral lean                                 ADL either performed or assessed with clinical judgement   ADL Overall ADL's : Needs assistance/impaired                     Lower Body Dressing: Total assistance;+2 for physical assistance;+2 for safety/equipment;Sitting/lateral leans;Bed level Lower Body Dressing Details (indicate cue type and reason): to don socks at EOB             Functional mobility during ADLs: Total assistance;+2 for physical assistance;+2 for safety/equipment General ADL Comments: total assist for all ADLs     Vision   Vision Assessment?: Vision impaired- to be further tested in functional context Additional Comments: L inattention and R gaze preference, continue assessment   Perception     Praxis      Cognition Arousal/Alertness: Awake/alert Behavior During Therapy: Flat affect;Impulsive;Restless Overall Cognitive Status: Impaired/Different from baseline Area of Impairment: Attention;Following commands;Problem solving;Awareness;Safety/judgement;Memory;Orientation  Orientation Level: Disoriented to;Place;Time;Situation Current Attention Level: Focused Memory: Decreased short-term memory;Decreased recall of precautions Following Commands: Follows one step commands  inconsistently Safety/Judgement: Decreased awareness of safety;Decreased awareness of deficits Awareness: Intellectual Problem Solving: Slow processing;Decreased initiation;Difficulty sequencing;Requires verbal cues;Requires tactile cues General Comments: Patient reports being at her "grandmothers house", no awareness to situation. Asking to "get rid of the snake", with further questioning "the snake in my nose".  She follows 1 step commands inconsistently but requires constant redirection to task, unable to sustain task attention for purposeful use of R UE. L inattention.        Exercises Exercises: Other exercises Other Exercises Other Exercises: PROM to L UE from shoulder (to 90FF) to hand, remains flaccid   Shoulder Instructions       General Comments patient with 2+ finger width subluxation in L shoulder, repositioned UE on pillow for increased support    Pertinent Vitals/ Pain       Pain Assessment: Faces Faces Pain Scale: Hurts little more Pain Location: R wrist/hand Pain Descriptors / Indicators: Guarding (hyperfocused) Pain Intervention(s): Limited activity within patient's tolerance;Monitored during session;Repositioned  Home Living                                          Prior Functioning/Environment              Frequency  Min 2X/week        Progress Toward Goals  OT Goals(current goals can now be found in the care plan section)  Progress towards OT goals: Progressing toward goals  Acute Rehab OT Goals Patient Stated Goal: to walk OT Goal Formulation: With patient  Plan Frequency remains appropriate;Discharge plan needs to be updated    Co-evaluation    PT/OT/SLP Co-Evaluation/Treatment: Yes Reason for Co-Treatment: Complexity of the patient's impairments (multi-system involvement);Necessary to address cognition/behavior during functional activity;For patient/therapist safety;To address functional/ADL transfers   OT goals  addressed during session: ADL's and self-care      AM-PAC OT "6 Clicks" Daily Activity     Outcome Measure   Help from another person eating meals?: Total Help from another person taking care of personal grooming?: A Lot Help from another person toileting, which includes using toliet, bedpan, or urinal?: Total Help from another person bathing (including washing, rinsing, drying)?: Total Help from another person to put on and taking off regular upper body clothing?: Total Help from another person to put on and taking off regular lower body clothing?: Total 6 Click Score: 7    End of Session    OT Visit Diagnosis: Unsteadiness on feet (R26.81);Other abnormalities of gait and mobility (R26.89);Muscle weakness (generalized) (M62.81);Hemiplegia and hemiparesis Hemiplegia - Right/Left: Left Hemiplegia - dominant/non-dominant: Non-Dominant Hemiplegia - caused by: Cerebral infarction   Activity Tolerance Patient tolerated treatment well   Patient Left in bed;with call bell/phone within reach;with bed alarm set;with restraints reapplied   Nurse Communication Mobility status        Time: 9381-0175 OT Time Calculation (min): 28 min  Charges: OT General Charges $OT Visit: 1 Visit OT Treatments $Self Care/Home Management : 8-22 mins  Barry Brunner, OT Acute Rehabilitation Services Pager [phone] Office [phone]    Brandi Myers 07/23/2020, 11:13 AM

## 2020-07-23 NOTE — Progress Notes (Addendum)
ANTICOAGULATION CONSULT NOTE - Follow Up Consult  Pharmacy Consult for Heparin Indication: RUE occlusion  Allergies  Allergen Reactions  . Norco [Hydrocodone-Acetaminophen] Itching, Nausea And Vomiting and Swelling    Patient Measurements: Height: 5\' 5"  (165.1 cm) Weight: 69.7 kg (153 lb 10.6 oz) IBW/kg (Calculated) : 57  Vital Signs: Temp: 98.9 F (37.2 C) (04/11 0336) Temp Source: Axillary (04/11 0336) BP: 137/85 (04/11 0336) Pulse Rate: 102 (04/11 0336)  Labs: Recent Labs    07/21/20 0224 07/21/20 1208 07/22/20 0232 07/22/20 1439 07/23/20 0402  HGB 9.5*  --  8.9*  --  9.9*  HCT 34.5*  --  32.4*  --  35.4*  PLT 886*  --  893*  --  915*  HEPARINUNFRC 0.19*   < > 0.26* 0.29* 0.44  CREATININE 0.52  --  0.52  --  0.74   < > = values in this interval not displayed.    Estimated Creatinine Clearance: 96.2 mL/min (by C-G formula based on SCr of 0.74 mg/dL).  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  Heparin level this morning is therapeutic for goal at 0.44.  H/H is stable. Platelets are elevated and trending up at 915.  No bleeding or issues with infusion noted.   Noted that eventual plan for transition to oral anticoagulation but awaiting potential placement of PEG tube.    Goal of Therapy:  Heparin level 0.3-0.5 units/mL Monitor platelets by anticoagulation protocol: Yes   Plan:  Continue heparin slightly to 2500 units/hr  Recheck in 6 hrs to confirm in setting of thrombocytosis Daily heparin level and CBC, s/sx of bleeding  Follow-up plans for PEG   31, PharmD, BCPS, BCCCP Clinical Pharmacist Please refer to [facility] for [facility] numbers 07/23/2020

## 2020-07-23 NOTE — Progress Notes (Signed)
  Speech Language Pathology Treatment: Dysphagia;Cognitive-Linquistic  Patient Details Name: Brandi Myers MRN: [id number] DOB: 1986-04-02 Today's Date: 07/23/2020 Time: 1040-1050 SLP Time Calculation (min) (ACUTE ONLY): 10 min  Assessment / Plan / Recommendation Clinical Impression  SLP provided therapeutic trials of pudding thick liquids in effort to target timing of swallowing with cues for attention to PO and hand over hand assisted self feed. Judgement of timing subjective in clinical observation, however it seemed quite variable and related to pt level of distraction at the time (talking with bolus in her mouth, reaching for something, etc). Pt required constant redirection and cues for intellectual awareness of situation. Frequent hard coughing after swallows of just pudding thick liquids observed throughout session. Pt not ready for initiation of restricted diet given ongoing signs of aspiration, though at some point she may benefit from attempting pudding thick liquids with risk given appearance of hard effective cough. Would not advise prolonged NPO status after d/c.   HPI HPI: 35 yo female admitted with L sided weakness, given TPA for R MCA involving basal ganglia, frontal, temporal lobes  3/23 s/p BIL revascularization  R MCA ACA achieving 2B revascularization. Pt intubated 3/23-31. 3/24 R UE thrombectomy. CT revealed worsening midline shift. 3/25 decompressive hemicranitectomy, R side bone flap in abdomen. PMH current smoker uncontrolled DM HTn      SLP Plan  Continue with current plan of care       Recommendations  Diet recommendations: NPO                Follow up Recommendations: Inpatient Rehab SLP Visit Diagnosis: Dysphagia, oropharyngeal phase (R13.12) Plan: Continue with current plan of care       GO               Harlon Ditty, MA CCC-SLP  Acute Rehabilitation Services Pager [phone] Office [phone]  Claudine Mouton 07/23/2020, 1:18  PM

## 2020-07-23 NOTE — Progress Notes (Signed)
   07/23/20 0459  Provider Notification  Provider Name/Title Dr. Rachael Darby  Date Provider Notified 07/23/20  Time Provider Notified [phone]  Notification Type Page  Notification Reason Critical result (Platelet count 915)  Test performed and critical result Platelet count 915  Date Critical Result Received 07/23/20  Time Critical Result Received 0459  Provider response No new orders  Date of Provider Response 07/23/20  Time of Provider Response [phone]

## 2020-07-24 ENCOUNTER — Encounter (HOSPITAL_COMMUNITY): Payer: Self-pay | Admitting: Neurology

## 2020-07-24 DIAGNOSIS — D75839 Thrombocytosis, unspecified: Secondary | ICD-10-CM | POA: Diagnosis not present

## 2020-07-24 DIAGNOSIS — D72829 Elevated white blood cell count, unspecified: Secondary | ICD-10-CM | POA: Diagnosis not present

## 2020-07-24 DIAGNOSIS — I6601 Occlusion and stenosis of right middle cerebral artery: Secondary | ICD-10-CM | POA: Diagnosis not present

## 2020-07-24 DIAGNOSIS — I749 Embolism and thrombosis of unspecified artery: Secondary | ICD-10-CM

## 2020-07-24 LAB — CBC
HCT: 35.9 % — ABNORMAL LOW (ref 36.0–46.0)
Hemoglobin: 10 g/dL — ABNORMAL LOW (ref 12.0–15.0)
MCH: 22.1 pg — ABNORMAL LOW (ref 26.0–34.0)
MCHC: 27.9 g/dL — ABNORMAL LOW (ref 30.0–36.0)
MCV: 79.2 fL — ABNORMAL LOW (ref 80.0–100.0)
Platelets: 925 10*3/uL (ref 150–400)
RBC: 4.53 MIL/uL (ref 3.87–5.11)
RDW: 25.4 % — ABNORMAL HIGH (ref 11.5–15.5)
WBC: 12.5 10*3/uL — ABNORMAL HIGH (ref 4.0–10.5)
nRBC: 0 % (ref 0.0–0.2)

## 2020-07-24 LAB — C-REACTIVE PROTEIN: CRP: 2.9 mg/dL — ABNORMAL HIGH (ref <1.0)

## 2020-07-24 LAB — GLUCOSE, CAPILLARY
Glucose-Capillary: 105 mg/dL — ABNORMAL HIGH (ref 70–99)
Glucose-Capillary: 107 mg/dL — ABNORMAL HIGH (ref 70–99)
Glucose-Capillary: 120 mg/dL — ABNORMAL HIGH (ref 70–99)
Glucose-Capillary: 131 mg/dL — ABNORMAL HIGH (ref 70–99)
Glucose-Capillary: 142 mg/dL — ABNORMAL HIGH (ref 70–99)
Glucose-Capillary: 187 mg/dL — ABNORMAL HIGH (ref 70–99)
Glucose-Capillary: 193 mg/dL — ABNORMAL HIGH (ref 70–99)

## 2020-07-24 LAB — BASIC METABOLIC PANEL
Anion gap: 10 (ref 5–15)
BUN: 8 mg/dL (ref 6–20)
CO2: 28 mmol/L (ref 22–32)
Calcium: 9.7 mg/dL (ref 8.9–10.3)
Chloride: 100 mmol/L (ref 98–111)
Creatinine, Ser: 0.53 mg/dL (ref 0.44–1.00)
GFR, Estimated: >60 mL/min (ref >60)
Glucose, Bld: 116 mg/dL — ABNORMAL HIGH (ref 70–99)
Potassium: 4.1 mmol/L (ref 3.5–5.1)
Sodium: 138 mmol/L (ref 135–145)

## 2020-07-24 LAB — SEDIMENTATION RATE: Sed Rate: 92 mm/hr — ABNORMAL HIGH (ref 0–22)

## 2020-07-24 LAB — HEPARIN LEVEL (UNFRACTIONATED): Heparin Unfractionated: 0.22 IU/mL — ABNORMAL LOW (ref 0.30–0.70)

## 2020-07-24 MED ORDER — SODIUM CHLORIDE 0.9 % IV SOLN
510.0000 mg | INTRAVENOUS | Status: AC
  Administered 2020-07-24 – 2020-07-31 (×2): 510 mg via INTRAVENOUS
  Filled 2020-07-24 (×3): qty 17

## 2020-07-24 NOTE — Progress Notes (Addendum)
  Speech Language Pathology Treatment: Dysphagia  Patient Details Name: Brandi Myers MRN: [id number] DOB: 06/20/1985 Today's Date: 07/24/2020 Time: 1115-1130 SLP Time Calculation (min) (ACUTE ONLY): 15 min  Assessment / Plan / Recommendation Clinical Impression  Pt seen in setting of readiness for diet initiation to assist in plan of care going forward. Pt is asking for food and drink, but is poorly attentive to even desirable food. Pt given an oreo, which took her ten minutes to take three small bites with followed by delayed coughing and oral residue. Pt will slowly be able to progress through PO intake, but quality of intake, particularly of modified textures, will be poor for a while. Recommend long term source of nutrition while she undergoes therapy. Will start offering purees and pudding thick liquids at mealtimes and follow. Recommend SNF.   HPI HPI: 35 yo female admitted with L sided weakness, given TPA for R MCA involving basal ganglia, frontal, temporal lobes  3/23 s/p BIL revascularization  R MCA ACA achieving 2B revascularization. Pt intubated 3/23-31. 3/24 R UE thrombectomy. CT revealed worsening midline shift. 3/25 decompressive hemicranitectomy, R side bone flap in abdomen. PMH current smoker uncontrolled DM HTn      SLP Plan  Continue with current plan of care       Recommendations  Diet recommendations: Pudding-thick liquid;Dysphagia 1 (puree)                Follow up Recommendations: Inpatient Rehab SLP Visit Diagnosis: Dysphagia, oropharyngeal phase (R13.12) Plan: Continue with current plan of care       GO               Harlon Ditty, MA CCC-SLP  Acute Rehabilitation Services Pager [phone] Office [phone]  Claudine Mouton 07/24/2020, 12:03 PM

## 2020-07-24 NOTE — Progress Notes (Signed)
ANTICOAGULATION CONSULT NOTE - Follow Up Consult  Pharmacy Consult for Heparin Indication: RUE occlusion  Allergies  Allergen Reactions  . Norco [Hydrocodone-Acetaminophen] Itching, Nausea And Vomiting and Swelling    Patient Measurements: Height: 5\' 5"  (165.1 cm) Weight: 69.7 kg (153 lb 10.6 oz) IBW/kg (Calculated) : 57  Vital Signs: Temp: 97.5 F (36.4 C) (04/11 1958) Temp Source: Oral (04/11 1958) BP: 133/87 (04/11 1958) Pulse Rate: 105 (04/11 1958)  Labs: Recent Labs    07/21/20 0224 07/21/20 1208 07/22/20 0232 07/22/20 1439 07/23/20 0402 07/23/20 1431 07/23/20 2208  HGB 9.5*  --  8.9*  --  9.9*  --   --   HCT 34.5*  --  32.4*  --  35.4*  --   --   PLT 886*  --  893*  --  915*  --   --   HEPARINUNFRC 0.19*   < > 0.26*   < > 0.44 0.19* 0.36  CREATININE 0.52  --  0.52  --  0.74  --   --    < > = values in this interval not displayed.    Estimated Creatinine Clearance: 96.2 mL/min (by C-G formula based on SCr of 0.74 mg/dL).  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  Heparin level this morning is therapeutic for goal at 0.44.  H/H is stable. Platelets are elevated and trending up at 915.  No bleeding or issues with infusion noted.   Noted that eventual plan for transition to oral anticoagulation but awaiting potential placement of PEG tube.   Confirmatory heparin level this afternoon 0.19 is subtherapeutic on heparin 2500 units/hr after being therapeutic (0.44) this morning. RN reports pt is moving around and was possibly laying on IV but RN checked IV access now and no issues with flushing. No bleeding noted  4/12 AM update:  Heparin level therapeutic    Goal of Therapy:  Heparin level 0.3-0.5 units/mL Monitor platelets by anticoagulation protocol: Yes   Plan:  Cont heparin 2500 units/hr Confirmatory heparin level with AM labs  6/12, PharmD, BCPS Clinical Pharmacist Phone: [phone]

## 2020-07-24 NOTE — Progress Notes (Addendum)
STROKE TEAM PROGRESS NOTE   INTERVAL HISTORY Patient was found with her R leg out of the bed and patient lying almost horizontally in the bed. When asked where the patient was trying to go, she announced that she was going to the bathroom. Patient was made aware that she had a PurWick on and she could just allow herself to go, initially the patient shook her head no in disbelief; however a few moments later the patient proceeded to urinate into the [facility] with her legs wide open and facing the door. The patient was able to answer some questions today, but was less verbal and preferred to shake her head no and nod yes. Patient indicated that she was hungry and that she was doing ok today.  Patient had bedside swallow eval done by speech therapy and failed miserably and a PEG tube has been recommended.  Spoke to patient's next of kin over the phone who agrees with plan for PEG tube and trauma team will be consulted vital signs are stable.  Neurological exam is unchanged.  She continues to have progressive thrombocytosis with platelets greater than 900,000 today and hematology has been consulted. Vitals:   07/24/20 0015 07/24/20 0346 07/24/20 0837 07/24/20 1300  BP: 124/76 118/61 127/86 131/78  Pulse: 86 90 100 96  Resp: 17 18 17 19   Temp: (!) 97.3 F (36.3 C) 97.9 F (36.6 C) 98.2 F (36.8 C) 98.1 F (36.7 C)  TempSrc: Oral Oral Oral Axillary  SpO2: 96% 96% 96% 96%  Weight:      Height:       CBC:  Recent Labs  Lab 07/23/20 0402 07/24/20 0451  WBC 12.9* 12.5*  HGB 9.9* 10.0*  HCT 35.4* 35.9*  MCV 77.6* 79.2*  PLT 915* 925*   Basic Metabolic Panel:  Recent Labs  Lab 07/23/20 0402 07/24/20 0451  NA 137 138  K 4.0 4.1  CL 102 100  CO2 28 28  GLUCOSE 108* 116*  BUN 6 8  CREATININE 0.74 0.53  CALCIUM 9.4 9.7   IMAGING past 24 hours No results found.  PHYSICAL EXAM  General: caucasian female, lying in bed in NAD with lower half of body towards the door attempting to get  out of bed, gazing to the right, on first exam patient was more awake. On second exam after patient failed her swallow test patient was more drowsy and had to have tactile stimulation to wake her up.   Lungs: Symmetrical Chest rise, no labored breathing  Cardio: Regular Rate and Rhythm  Abdomen: Soft, non-tender  Skin: right thumb mild discoloration with tenderness to touch, right fingers less cool,dry eschar wound on the palmar region unchanged with tender to touch.  Neuro - 1st exam: awake, disinhibited, a bit delirious/ not trusting. 2nd exam drowsy had to have tactile stimulation to wake her up. Right gaze preference with inattention to the left. Left facial droop.  Tongue midline.RUE and RLE spontaneous movement against gravity and LUE flaccid and LLE externally rotated.Sensation: Intact, LLE intact to noxious stimuli only, and only has movement in the foot not the leg. Extinction positive, with neglect of both LUE and LLE.   Strength: Bulk appears normal at this time bilaterally. 3/5 in RUE and RLE. Decreased tone in LLE and and LUE. External rotation. 0/5 in LLE and LUE.  ASSESSMENT/PLAN Ms.Yvonnie L Priceis a 35 y.o.femalewith history of uncontrolled diabetesandhypertensionpresenting with left sided weakness and slurred speech.Per family there is a complicated social situation at this time. She  lives intermittently in an apartment by herself but was kicked out and they went to pick her up at 5:30 AM. At that point she seemed "off"and generally quiet but did not seem to have any difficulty with left-sided weakness.Family attributed this to psychosocial issues and potential substance use noting that her sister passed away from an overdose in October. Later in the morning she complained of diffuse body pain and was administered gabapentin and a muscle relaxer.At 11:30 AM she stood up and walkedto the showerand at1215 she was found on the bottom of the tub with slurred  speech and difficulty moving the left side. She was not preactivated by EMS, but on arrival in the ED clearly appear to have a doorway examination consistent with a right MCA territory infarct.CTA revealed right ICA occlusion as well as a right common carotid bifurcation thrombus extending into the external carotid (felt to be the source of the occlusion). CT perfusion imaging demonstrated possible core infarct of 51 mL (though notably this was obtained within 2 hours of symptom onset and therefore may reflect an overestimate of the true core). Mismatch ratio was 2 with a penumbra of 63 mL. Case was discussed with Dr. Corliss Skains of neuro interventional radiology and Dr. Otelia Limes of [facility] and the patient was accepted for transfer to [facility] for emergent intervention. She was also started on TPA after consenting the patient's emergency contact with full discussion of risks of bleeding and importance of accurately determining the patient's last known well.[name] confirm the last known well as described above and agreed that the patient would want TPA given the risk/benefit.She confirmed that the patient had no recent procedures or other contraindications to TPA.Regarding her baseline, she is functional at baseline and family believes she is smoking at least1pack a day currently. She is on Metformin for her diabetes, does not take oral contraceptive pills, has had no recent surgeries or procedures. SLP attempted again with patient for PO diet; however patient failed and SLP and Neuro team agree patient would do best with PEG placement to help increase caloric intake and decrease risk of aspiration. Patient's Aunt Vena Rua, in her EMR, provided verbal consent and reported she would be in to visit the patient around 10am tom. To discuss ongoing care. She also reported that patient's father is currently in the hospital at this time. Patient exam remains stable with patient exhibiting  fluctuation in alertness throughout the day and disinhibition. Patient is currently stuck in her bed with all 4 rails up for safety. Patient would benefit greatly from more intensive therapy.    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   CT no acute finding but suspicious for right MCA early ischemic changes.   CTA head and neck showed right ICA terminal and proximal MCA occlusion, right CCA nonocclusive thrombus.  CT perfusion showed large penumbra.   Status post thrombectomy with right terminal ICA, right PCA and right ACA TICI2breperfusion however does have distal micro emboli.  MRI showed large right MCA infarct with 24mm MLS and hemorrhagic transformation.   MRA showed patent intracranial vessels.  CT Head WO Contrast 3/26 620 - Improved midline shift and normalized left lateral ventricular volume after decompressive craniectomy. No progressive infarct or hemorrhage.  2D Echo- EF:  60-65%. No wall motion abnormality.  TCD Bubble - No PFO  TEE today no source of emboli, no PFO  DVT US -No evidence of DVT bilaterally  LDL99  HgbA1c10.6  VTE prophylaxis -heparin IV  No antithromboticprior to admission, now on heparin IV for right ulnar artery occlusion, pharmacy managed  Therapy recommendations:CIR however no family support so SNF is working plan  Disposition:SNF due to lack of family support, will reconsult CIR as there has been difficulty in finding a SNF to accept patient due to age  Cerebral edema- stable  CT Headrepeat3/24 -Continued interval evolution of right MCA distribution infarct with associated hemorrhagic transformation. Worsened  regional mass effect and edema with increased right-to-left midline shift now measuring up to 9 mm.Increased dilatation of the right lateral ventricle concerning for developing ventricular trapping. No other new acute intracranial abnormality.  CT repeat 3/25 unchangedright cerebral cytotoxic edema with central hemorrhage. Midline shift is similar at 1 cm.Unchanged left lateral ventricle dilatation with evidence of transependymal flow.  S/pdecompressive hemicraniectomy (3/25)with Dr. Venetia MaxonStern  CT Head WO Contrast 3/26 620 - Improved midline shift and normalized left lateral ventricular volume after decompressive craniectomy. No progressive infarct or hemorrhage.  On keppra 500 mg bid  3% salineoff  Na 137->141->147->150>158>150> 141>139>140>139>138  Staple removal completed on 4/8  Thrombocytosis - Plt 915 today have been trending up since 4/1 - Pathology smear resulted: Microcytic anemia with anisocytosis and polychromasia. Thrombocytosis. Mild leukocytosis. Rare blast. - Continue heparin - IV feraheme weekly x2 per heme/onc recc's patient has no known allergies beyond Norco - Please see Heme/onc note, multiple test ordered  Fever, resolved Leukocytosis, improving  Tmax 101.7->102.5> 98.6> 98.7>99.9>afebrile  WBC -16.2>15.7>16.8>18.6->21.5->19.6->14.8  TEE no endocarditis  UA (4/3) neg  CXR4/4-left perihilar atelectasis  CXR 4/7 No acute findings   Abd Xray 4/7 No acute findings  Respiratory Culture (3/26) - Strep Pneumoniae  Blood CxNo growth after5days drawn 3/25  Completed 7 day course of Ceftriaxone on 4/2  ID on board, do not think endocarditis.  Tachycardia and tachypnea Leukocytosis  WBC -16.2>15.7>16.8>18.6->21.5->19.6->14.8  Likely related to ischemic right hand  on metoprolol 25mg  bid   UA: negative  ABG: ph 7.4 pCO2 40 pao2 155   LE venous doppler repeat (4/3): No evidence of deep vein thrombosis   ID on board, do not consider  endocarditis  R critical limb ischemia  S/p thrombectomy (3/24) for right brachial artery occlusion  Now Right hand pain, cool to touch, dry eschar wound on the palmar region, decreased dopplers per RN, and tachycardia, tachypnea with leukocytosis  UE arterial duplex (4/3) - right ulnar artery occlusion.   VVS Dr. Chestine Sporelark - No surgical intervention at this time. Recommends medical management with Heparin infusion.   Discussed with Dr. Yetta BarreJones with Neurosurgery ok'd the initiation of Heparin  Heparin infusion started on 4/3. Plan to transition to anticoagulation at discharge assuming no other issues per Dr. Chestine Sporelark.  Percocet 5/325 q 6hr prn for right hand pain  Hyperlipidemia  Home meds:none  LDL 99, goal < 70  On lipitor 40 now  Continue statin at discharge  Poorly controlled Diabetes type II, with hyperglycemia   Home meds:Metformin, Glargine, Aspart  HgbA1c 10.6, goal < 7.0  CBGs  SSI  Levemir to 35 units bid  Novolog 6 units every 4 hours  Dysphagia  Feeding via cortrak  SLP following, NPO remains in place , patient failed swallow study today  S/p Cortrak 4/8 - on TF @ 50 and IVF @ 25  SLP agrees with progression to PEG  Aunt Vena Rua has given verbal consent  Trauma surgery consulted for PEG placement  Speech following next week for repeat eval  Cocaine abuse  UDS positive for cocaine  Cessation education will be provided  Constipation- improving  On bowel regimen  BM 4/11  Sick thyroid - resolved  TSH: 0.307 (Low) Free T4:1.27 (high)  On metoprolol 25 bid  Repeat TSH and FT4 are WNL  IDA  Per Heme/onc recc's : IV Fereheme weekly x2   4/8 Iron level low at 21, Ferritin and TIBC wnl   Tobacco abuse  Current smoker  Smoking cessation counselingwill beprovided  Other Stroke Risk Factors  Substance abuse - UDS: THC POSITIVEWill advise patientto stop using due to stroke risk. Thrombocytosis likely  reactive as platelet counts were normal on admission  Hospital day # 20  Eliseo Gum, MD PGY-1 I have personally obtained history,examined this patient, reviewed notes, independently viewed imaging studies, participated in medical decision making and plan of care.ROS completed by me personally and pertinent positives fully documented  I have made any additions or clarifications directly to the above note. Agree with note above.  Continue nil by mouth status and tube feeds.  Consult trauma team for PEG tube.  Dr. Morrie Sheldon spoke to patient's next of kin who agreed with plan for PEG tube hopefully transfer to skilled nursing facility next week for rehab as inpatient rehab has down again.  Appreciate hematology consult for thrombocytosis.  Greater than 50% time during this 25-minute visit was spent in counseling and coordination of care about her stroke and discussion about dysphagia and PEG tube and answering questions  Delia Heady, MD Medical Director [facility] Pager: [phone] 07/24/2020 3:14 PM To contact Stroke Continuity provider, please refer to [email or web address]. After hours, contact General Neurology

## 2020-07-24 NOTE — Consult Note (Addendum)
[place]  [phone] [phone]    [place]  Referring MD:  Dr. Antony Contras  Reason for Referral: Thrombocytosis  HPI: Ms. Veley is a 35 year old female with a past medical history significant for hypertension and uncontrolled diabetes mellitus.  She initially presented to [facility] with left-sided weakness and slurred speech.  CT head/CTA head and neck showed acute infarction involving the right lentiform nucleus, thrombus of the right common carotid bifurcation extending into the external carotid, occlusion of the supraclinoid right ICA probably reflecting embolization from the thrombus. She received IV TPA.  She is status post thrombectomy with right terminal ICA, right PCA, and right ACA.  She underwent a decompressive craniectomy on 07/06/2020 additional studies include an MRI which showed a large right MCA infarct with 6 mm MLS and hemorrhagic transformation and a CT of the head without contrast which showed improved midline shift and normalized left lateral ventricular volume after decompressive craniectomy.  On admission, her WBC was 17.8, hemoglobin 9.5, hematocrit 35.0, MCV 67.2, platelets 235,000.  Urine drug screen positive for cocaine and THC.  She had iron studies performed on 07/20/2020 and her iron level was 21, TIBC 449, percent saturation 5%, and ferritin 25.  CBC from this morning showed a WBC of 12.5, hemoglobin 10.0, hematocrit 35.9, MCV 79.2, platelets 925,000.  The patient was seen in her hospital room.  Nursing was at the bedside.  The patient was minimally interactive.  She kept yelling out "Minna Merritts."  This is the name of her son.  She is unable to provide a review of systems.  Denies pain.  Hematology was asked to see the patient to make recommendations regarding her thrombocytosis.  Past Medical History:  Diagnosis Date  . Diabetes mellitus without complication ([place])   . Hypertension   :    Past Surgical  History:  Procedure Laterality Date  . BUBBLE STUDY  07/19/2020   Procedure: BUBBLE STUDY;  Surgeon: Donato Heinz, MD;  Location: [place];  Service: Cardiovascular;;  . CESAREAN SECTION    . CHOLECYSTECTOMY    . CRANIOTOMY Right 07/06/2020   Procedure: RIGHT CRANIECTOMY WITH PLACEMENT OF BONE FLAP IN ABDOMEN;  Surgeon: Erline Levine, MD;  Location: [place];  Service: Neurosurgery;  Laterality: Right;  . IR ANGIO INTRA EXTRACRAN SEL COM CAROTID INNOMINATE UNI L MOD SED  07/04/2020  . IR ANGIO VERTEBRAL SEL VERTEBRAL UNI R MOD SED  07/04/2020  . IR CT HEAD LTD  07/04/2020  . IR CT HEAD LTD  07/04/2020  . IR PERCUTANEOUS ART THROMBECTOMY/INFUSION INTRACRANIAL INC DIAG ANGIO  07/04/2020  . RADIOLOGY WITH ANESTHESIA N/A 07/04/2020   Procedure: IR WITH ANESTHESIA;  Surgeon: Radiologist, Medication, MD;  Location: [place];  Service: Radiology;  Laterality: N/A;  . TEE WITHOUT CARDIOVERSION N/A 07/19/2020   Procedure: TRANSESOPHAGEAL ECHOCARDIOGRAM (TEE);  Surgeon: Donato Heinz, MD;  Location: [place];  Service: Cardiovascular;  Laterality: N/A;  . THROMBECTOMY BRACHIAL ARTERY Right 07/05/2020   Procedure: RIGHT UPPER EXTREMITY THROMBECTOMY;  Surgeon: Marty Heck, MD;  Location: [place];  Service: Vascular;  Laterality: Right;  :   CURRENT MEDS: Current Facility-Administered Medications  Medication Dose Route Frequency Provider Last Rate Last Admin  . 0.9 %  sodium chloride infusion   Intravenous PRN Rosalin Hawking, MD   Stopped at 07/13/20 1028  . 0.9 %  sodium chloride infusion   Intravenous Continuous Rosalin Hawking, MD 25 mL/hr at 07/24/20 0444 Infusion Verify at 07/24/20 0444  .  acetaminophen (TYLENOL) tablet 650 mg  650 mg Oral Q4H PRN Erline Levine, MD       Or  . acetaminophen (TYLENOL) 160 MG/5ML solution 650 mg  650 mg Per Tube Q4H PRN Erline Levine, MD   650 mg at 07/24/20 0845   Or  . acetaminophen (TYLENOL) suppository 650 mg  650 mg Rectal Q4H PRN Erline Levine,  MD      . atorvastatin (LIPITOR) tablet 40 mg  40 mg Per Tube q1800 Pierce, Dwayne A, RPH   40 mg at 07/23/20 1730  . bisacodyl (DULCOLAX) suppository 10 mg  10 mg Rectal Daily PRN Bailey-Modzik, Delila A, NP      . chlorhexidine gluconate (MEDLINE KIT) (PERIDEX) 0.12 % solution 15 mL  15 mL Mouth Rinse BID Erline Levine, MD   15 mL at 07/24/20 0847  . Chlorhexidine Gluconate Cloth 2 % PADS 6 each  6 each Topical Daily Erline Levine, MD   6 each at 07/23/20 0930  . docusate (COLACE) 50 MG/5ML liquid 100 mg  100 mg Per Tube BID Erline Levine, MD   100 mg at 07/24/20 0845  . feeding supplement (GLUCERNA 1.5 CAL) liquid 1,000 mL  1,000 mL Per Tube Continuous Candee Furbish, MD 50 mL/hr at 07/23/20 1643 1,000 mL at 07/23/20 1643  . ferrous sulfate 300 (60 Fe) MG/5ML syrup 300 mg  300 mg Per Tube BID WC Rosalin Hawking, MD   300 mg at 07/24/20 0845  . gabapentin (NEURONTIN) 250 MG/5ML solution 600 mg  600 mg Per Tube Q8H Garvin Fila, MD   600 mg at 07/24/20 0554  . heparin ADULT infusion 100 units/mL (25000 units/283m)  2,600 Units/hr Intravenous Continuous SGarvin Fila MD 25 mL/hr at 07/24/20 0609 2,500 Units/hr at 07/24/20 0609  . insulin aspart (novoLOG) injection 0-15 Units  0-15 Units Subcutaneous Q4H SErline Levine MD   3 Units at 07/24/20 0848  . insulin aspart (novoLOG) injection 6 Units  6 Units Subcutaneous Q4H SCandee Furbish MD   6 Units at 07/24/20 [phone] . insulin detemir (LEVEMIR) injection 35 Units  35 Units Subcutaneous BID BLesleigh NoeL, MD   35 Units at 07/24/20 0847  . labetalol (NORMODYNE) injection 10 mg  10 mg Intravenous Q2H PRN Olivencia-Simmons, Ivelisse, NP      . levETIRAcetam (KEPPRA) 100 MG/ML solution 500 mg  500 mg Per Tube BID Pham, Minh Q, RPH-CPP   500 mg at 07/24/20 0848  . MEDLINE mouth rinse  15 mL Mouth Rinse q12n4p XRosalin Hawking MD   15 mL at 07/23/20 1652  . metoprolol tartrate (LOPRESSOR) tablet 50 mg  50 mg Per Tube BID SGarvin Fila MD   50 mg at  07/24/20 0845  . multivitamin with minerals tablet 1 tablet  1 tablet Per Tube Daily SCandee Furbish MD   1 tablet at 07/24/20 0846  . ondansetron (ZOFRAN) tablet 4 mg  4 mg Oral Q4H PRN SErline Levine MD   4 mg at 07/19/20 1137   Or  . ondansetron (ZOFRAN) injection 4 mg  4 mg Intravenous Q4H PRN SErline Levine MD      . polyethylene glycol ([facility]/ GFloria Raveling packet 17 g  17 g Per Tube Daily SErline Levine MD   17 g at 07/24/20 0846  . senna-docusate (Senokot-S) tablet 1 tablet  1 tablet Per Tube QHS PRN SErline Levine MD      . sodium chloride flush (NS) 0.9 % injection 10-40 mL  10-40 mL Intracatheter Q12H Marty Heck, MD   10 mL at 07/24/20 0849      Allergies  Allergen Reactions  . Norco [Hydrocodone-Acetaminophen] Itching, Nausea And Vomiting and Swelling  :  Family History  Problem Relation Age of Onset  . Diabetes Maternal Grandmother   :  Social History   Socioeconomic History  . Marital status: Single    Spouse name: Not on file  . Number of children: Not on file  . Years of education: Not on file  . Highest education level: Not on file  Occupational History  . Occupation: home maker  Tobacco Use  . Smoking status: Current Every Day Smoker    Packs/day: 0.10    Types: Cigarettes  . Smokeless tobacco: Never Used  Substance and Sexual Activity  . Alcohol use: Yes    Comment: occ.  . Drug use: No  . Sexual activity: Not on file    Comment: tubal ligation  Other Topics Concern  . Not on file  Social History Narrative  . Not on file   Social Determinants of Health   Financial Resource Strain: Not on file  Food Insecurity: Not on file  Transportation Needs: Not on file  Physical Activity: Not on file  Stress: Not on file  Social Connections: Not on file  Intimate Partner Violence: Not on file  :  REVIEW OF SYSTEMS: Unable to obtain  Exam: Patient Vitals for the past 24 hrs:  BP Temp Temp src Pulse Resp SpO2  07/24/20 0837 127/86 98.2 F  (36.8 C) Oral 100 17 96 %  07/24/20 0346 118/61 97.9 F (36.6 C) Oral 90 18 96 %  07/24/20 0015 124/76 (!) 97.3 F (36.3 C) Oral 86 17 96 %  07/23/20 1958 133/87 (!) 97.5 F (36.4 C) Oral (!) 105 19 95 %  07/23/20 1607 124/80 98.1 F (36.7 C) Oral (!) 103 18 98 %  07/23/20 1137 (!) 142/86 98.2 F (36.8 C) Axillary (!) 102 20 96 %    General: Chronically ill-appearing female, no distress Eyes:  no scleral icterus.   ENT:  There were no oropharyngeal lesions, NG tube in place Lymphatics:  Negative cervical, supraclavicular or axillary adenopathy.   Respiratory: lungs were clear bilaterally without wheezing or crackles.   Cardiovascular:  Regular rate and rhythm, S1/S2, without murmur, rub or gallop.  There was no pedal edema.   GI:  abdomen was soft, flat, nontender, nondistended, without organomegaly.   Musculoskeletal: Left arm flaccid Skin exam was without ecchymosis, petechiae.   Neuro: Awake, oriented to self only  LABS:  Lab Results  Component Value Date   WBC 12.5 (H) 07/24/2020   HGB 10.0 (L) 07/24/2020   HCT 35.9 (L) 07/24/2020   PLT 925 (HH) 07/24/2020   GLUCOSE 116 (H) 07/24/2020   CHOL 154 07/05/2020   TRIG 82 07/08/2020   HDL 39 (L) 07/05/2020   LDLCALC 99 07/05/2020   ALT 14 07/04/2020   AST 23 07/04/2020   NA 138 07/24/2020   K 4.1 07/24/2020   CL 100 07/24/2020   CREATININE 0.53 07/24/2020   BUN 8 07/24/2020   CO2 28 07/24/2020   INR 1.2 07/04/2020   HGBA1C 10.6 (H) 07/04/2020    CT Angio Head W/Cm &/Or Wo Cm  Result Date: 07/04/2020 CLINICAL DATA:  Left-sided weakness and slurred speech EXAM: CT HEAD WITHOUT CONTRAST CT ANGIOGRAPHY HEAD AND NECK CT PERFUSION BRAIN TECHNIQUE: Multi detector CT imaging of the head was performed using the  standard protocol without intravenous contrast. Multidetector CT imaging of the head and neck was performed using the standard protocol during bolus administration of intravenous contrast. Multiplanar CT image  reconstructions and MIPs were obtained to evaluate the vascular anatomy. Carotid stenosis measurements (when applicable) are obtained utilizing NASCET criteria, using the distal internal carotid diameter as the denominator. Multiphase CT imaging of the brain was performed following IV bolus contrast injection. Subsequent parametric perfusion maps were calculated using RAPID software. CONTRAST:  144m OMNIPAQUE IOHEXOL 350 MG/ML SOLN COMPARISON:  None. FINDINGS: CT HEAD FINDINGS Brain: There is no acute intracranial hemorrhage or mass effect. Loss of gray-white differentiation involving a portion of the right lentiform nucleus. Ventricles and sulci are normal in size and configuration. No extra-axial collection. Vascular: No hyperdense vessel. Skull: Unremarkable. Sinuses/Orbits: Lobular maxillary sinus mucosal thickening. Orbits are unremarkable. Other: Minor patchy left mastoid tip opacification. ASPECTS ([facility] Score) - Ganglionic level infarction (caudate, lentiform nuclei, internal capsule, insula, M1-M3 cortex): 6 - Supraganglionic infarction (M4-M6 cortex): 3 Total score (0-10 with 10 being normal): 9 CTA NECK FINDINGS Aortic arch: Normal caliber aorta.  Great vessel origins are patent. Right carotid system: Common carotid is patent. There is thrombus at the bifurcation extending into the external carotid. Less than 50% stenosis at the ICA origin. The cervical ICA is relatively smaller in caliber. Left carotid system: Patent.  No stenosis. Vertebral arteries: Patent. Right vertebral artery slightly dominant. No stenosis. Skeleton: Unremarkable. Other neck: Thyroid not well evaluated due to streak artifact. Likely reactive cervical lymph nodes. Upper chest: Included lungs are clear. Endotracheal tube is present. Review of the MIP images confirms the above findings CTA HEAD FINDINGS Anterior circulation: Proximal intracranial internal carotid artery is patent. There is loss of  enhancement at the level of the supraclinoid portion. Faint enhancement is seen along the right M1 MCA. There is reconstitution of M2 branches. Patent right A1 ACA. Anterior communicating artery is present. Left anterior and middle cerebral arteries are patent. Posterior circulation: Intracranial vertebral arteries, basilar artery, and posterior cerebral arteries are patent. Venous sinuses: As permitted by contrast timing, patent. Review of the MIP images confirms the above findings CT Brain Perfusion Findings: CBF (<30%) Volume: 574mPerfusion (Tmax>6.0s) volume: 11470mismatch Volume: 42m47mfarction Location: Right MCA territory IMPRESSION: No acute intracranial hemorrhage. 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 Reconstitution of right MCA at the bifurcation. Right A1 ACA is patent. Anterior communicating artery is present. No posterior communicating artery is identified. Perfusion imaging demonstrates core infarction of 51 mL. This is greater than anticipated by noncontrast CT. Penumbra is 63 mL and therefore may be greater than calculated. Initial results were called by telephone at the time of interpretation on 07/04/2020 at 1:58 pm to provider Dr. BhagCurly Shoreso verbally acknowledged these results. Electronically Signed   By: PranMacy Mis.   On: 07/04/2020 14:10   DG Chest 1 View  Result Date: 07/13/2020 CLINICAL DATA:  Status post extubation EXAM: CHEST  1 VIEW COMPARISON:  July 07, 2020 FINDINGS: Endotracheal tube has been removed. Enteric tube tip is below the diaphragm. Central catheter tip is in the right atrium. No pneumothorax. There is atelectatic change in the left perihilar region. Lungs elsewhere are clear. Heart size and pulmonary  vascularity normal. No  adenopathy. No bone lesions. IMPRESSION: Tube and catheter positions as described without pneumothorax. Left perihilar atelectasis. Lungs elsewhere clear. Heart size normal. Electronically Signed   By: Lowella Grip III M.D.   On: 07/13/2020 07:59   DG Chest 1 View  Result Date: 07/07/2020 CLINICAL DATA:  Pneumonia EXAM: CHEST  1 VIEW COMPARISON:  07/06/2020 FINDINGS: Endotracheal tube terminates 6 cm above the carina. Very mild bibasilar opacities, likely atelectasis. No pleural effusion or pneumothorax. The heart is normal in size. Left arm PICC terminates the cavoatrial junction. IMPRESSION: Endotracheal tube terminates 6 cm above the carina. Electronically Signed   By: Julian Hy M.D.   On: 07/07/2020 09:28   DG Chest 1 View  Result Date: 07/04/2020 CLINICAL DATA:  Intubation EXAM: CHEST  1 VIEW COMPARISON:  01/12/2016 FINDINGS: Endotracheal tube is 3.6 cm above the carina. NG tube is in the stomach. No confluent opacities or effusions. Heart is normal size. No acute bony abnormality. IMPRESSION: Support devices in expected position. No acute cardiopulmonary disease. Electronically Signed   By: Rolm Baptise M.D.   On: 07/04/2020 15:09   CT HEAD WO CONTRAST  Result Date: 07/14/2020 CLINICAL DATA:  Stroke follow-up EXAM: CT HEAD WITHOUT CONTRAST TECHNIQUE: Contiguous axial images were obtained from the base of the skull through the vertex without intravenous contrast. COMPARISON:  07/07/2020 FINDINGS: Brain: Large right MCA territory infarct with fading hemorrhage. Similar degree of edema and swelling with bulging of the brain through the craniectomy defect. No hydrocephalus. Midline shift measures up to 3 mm, and improvement from 6 mm previously. Vascular: Stable Skull: Unremarkable right craniectomy Sinuses/Orbits: Partial bilateral mastoid opacification in the setting of nasal intubation. Improved sinus aeration. IMPRESSION: 1. Evolving right MCA territory infarct  with slightly decreased swelling and midline shift. 2. Improved sinus inflammation. 3. No new abnormality. Electronically Signed   By: Monte Fantasia M.D.   On: 07/14/2020 04:13   CT HEAD WO CONTRAST  Result Date: 07/07/2020 CLINICAL DATA:  Stroke follow-up EXAM: CT HEAD WITHOUT CONTRAST TECHNIQUE: Contiguous axial images were obtained from the base of the skull through the vertex without intravenous contrast. COMPARISON:  Yesterday FINDINGS: Brain: Decompressive craniectomy with improved midline shift and better drainage of the left lateral ventricle. Midline shift now measures 5 mm. Right MCA territory infarct affecting cortex and subcortical structures with patchy hazy hemorrhage that is non progressed. No new infarct is seen. Vascular: No hyperdense vessel or unexpected calcification. Skull: Unremarkable decompressive craniectomy on the right Sinuses/Orbits: Nasal septal perforation. Generalized mucosal thickening in the paranasal sinuses. IMPRESSION: Improved midline shift and normalized left lateral ventricular volume after decompressive craniectomy. No progressive infarct or hemorrhage Electronically Signed   By: Monte Fantasia M.D.   On: 07/07/2020 06:18   CT HEAD WO CONTRAST  Result Date: 07/06/2020 CLINICAL DATA:  Follow-up intracranial hemorrhage EXAM: CT HEAD WITHOUT CONTRAST TECHNIQUE: Contiguous axial images were obtained from the base of the skull through the vertex without intravenous contrast. COMPARISON:  Yesterday FINDINGS: Brain: Cytotoxic edema involving cortex and basal ganglia in the right MCA territory with hazy central hemorrhage that is non progressed. Midline shift measures up to 1 cm. Unchanged dilatation of the left lateral ventricle attributed to the degree of midline shift and mass effect, with transependymal flow. Vascular: Stable Skull: Stable Sinuses/Orbits: Nasal septal perforation and patchy sinus opacification. IMPRESSION: 1. Unchanged right cerebral cytotoxic edema  with central hemorrhage. Midline shift is similar at 1 cm. 2. Unchanged left lateral ventricle dilatation with evidence of transependymal  flow. Electronically Signed   By: Monte Fantasia M.D.   On: 07/06/2020 08:19   CT HEAD WO CONTRAST  Result Date: 07/05/2020 CLINICAL DATA:  Follow-up examination for acute stroke. EXAM: CT HEAD WITHOUT CONTRAST TECHNIQUE: Contiguous axial images were obtained from the base of the skull through the vertex without intravenous contrast. COMPARISON:  Comparison made with prior MRI from earlier the same day. FINDINGS: Brain: Continued interval evolution of right MCA distribution infarct, stable from prior MRI. Associated hemorrhagic transformation with hyperdense blood products throughout the area of infarction. Worsened regional mass effect and edema, with progressive effacement of the right lateral ventricle. Associated right-to-left midline shift has increased now measuring up to 9 mm. Increased dilatation of the right lateral ventricle concerning for developing ventricular trapping. Basilar cisterns remain patent. No transtentorial herniation. No other acute large vessel territory infarct or hemorrhage. No extra-axial collection. Vascular: No definite hyperdense vessel. Skull: Scalp soft tissues and calvarium within normal limits. Sinuses/Orbits: Visualized globes and orbital soft tissues within normal limits. Scattered mucosal thickening throughout the ethmoidal air cells, maxillary sinuses, and sphenoid sinuses. Small bilateral mastoid effusions noted. Other: None. IMPRESSION: 1. Continued interval evolution of right MCA distribution infarct with associated hemorrhagic transformation. Worsened regional mass effect and edema with increased right-to-left midline shift now measuring up to 9 mm. Increased dilatation of the right lateral ventricle concerning for developing ventricular trapping. 2. No other new acute intracranial abnormality. Critical Value/emergent results were  called by telephone at the time of interpretation on 07/05/2020 at 11:05 pm to provider Dr. Rory Percy, who verbally acknowledged these results. Electronically Signed   By: Jeannine Boga M.D.   On: 07/05/2020 23:06   CT Angio Neck W and/or Wo Contrast  Result Date: 07/04/2020 CLINICAL DATA:  Left-sided weakness and slurred speech EXAM: CT HEAD WITHOUT CONTRAST CT ANGIOGRAPHY HEAD AND NECK CT PERFUSION BRAIN TECHNIQUE: Multi detector CT imaging of the head was performed using the standard protocol without intravenous contrast. Multidetector CT imaging of the head and neck was performed using the standard protocol during bolus administration of intravenous contrast. Multiplanar CT image reconstructions and MIPs were obtained to evaluate the vascular anatomy. Carotid stenosis measurements (when applicable) are obtained utilizing NASCET criteria, using the distal internal carotid diameter as the denominator. Multiphase CT imaging of the brain was performed following IV bolus contrast injection. Subsequent parametric perfusion maps were calculated using RAPID software. CONTRAST:  138m OMNIPAQUE IOHEXOL 350 MG/ML SOLN COMPARISON:  None. FINDINGS: CT HEAD FINDINGS Brain: There is no acute intracranial hemorrhage or mass effect. Loss of gray-white differentiation involving a portion of the right lentiform nucleus. Ventricles and sulci are normal in size and configuration. No extra-axial collection. Vascular: No hyperdense vessel. Skull: Unremarkable. Sinuses/Orbits: Lobular maxillary sinus mucosal thickening. Orbits are unremarkable. Other: Minor patchy left mastoid tip opacification. ASPECTS ([facility]) - Ganglionic level infarction (caudate, lentiform nuclei, internal capsule, insula, M1-M3 cortex): 6 - Supraganglionic infarction (M4-M6 cortex): 3 Total score (0-10 with 10 being normal): 9 CTA NECK FINDINGS Aortic arch: Normal caliber aorta.  Great vessel origins are patent. Right carotid  system: Common carotid is patent. There is thrombus at the bifurcation extending into the external carotid. Less than 50% stenosis at the ICA origin. The cervical ICA is relatively smaller in caliber. Left carotid system: Patent.  No stenosis. Vertebral arteries: Patent. Right vertebral artery slightly dominant. No stenosis. Skeleton: Unremarkable. Other neck: Thyroid not well evaluated due to streak artifact. Likely reactive cervical lymph nodes. Upper  chest: Included lungs are clear. Endotracheal tube is present. Review of the MIP images confirms the above findings CTA HEAD FINDINGS Anterior circulation: Proximal intracranial internal carotid artery is patent. There is loss of enhancement at the level of the supraclinoid portion. Faint enhancement is seen along the right M1 MCA. There is reconstitution of M2 branches. Patent right A1 ACA. Anterior communicating artery is present. Left anterior and middle cerebral arteries are patent. Posterior circulation: Intracranial vertebral arteries, basilar artery, and posterior cerebral arteries are patent. Venous sinuses: As permitted by contrast timing, patent. Review of the MIP images confirms the above findings CT Brain Perfusion Findings: CBF (<30%) Volume: 64m Perfusion (Tmax>6.0s) volume: 1164mMismatch Volume: 6360mnfarction Location: Right MCA territory IMPRESSION: No acute intracranial hemorrhage. 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 Reconstitution of right MCA at the bifurcation. Right A1 ACA is patent. Anterior communicating artery is present. No posterior communicating artery is identified. Perfusion imaging demonstrates core infarction of 51 mL. This is greater than anticipated by noncontrast CT.  Penumbra is 63 mL and therefore may be greater than calculated. Initial results were called by telephone at the time of interpretation on 07/04/2020 at 1:58 pm to provider Dr. BhaCurly Shoresho verbally acknowledged these results. Electronically Signed   By: PraMacy MisD.   On: 07/04/2020 14:10   MR ANGIO HEAD WO CONTRAST  Result Date: 07/05/2020 CLINICAL DATA:  Stroke.  Post thrombectomy and tPA EXAM: MRI HEAD WITHOUT CONTRAST MRA HEAD WITHOUT CONTRAST TECHNIQUE: Multiplanar, multiecho pulse sequences of the brain and surrounding structures were obtained without intravenous contrast. Angiographic images of the head were obtained using MRA technique without contrast. COMPARISON:  CT angio head and neck 07/04/2020 FINDINGS: MRI HEAD FINDINGS Brain: Acute infarct right MCA territory. This involves much of the right basal ganglia as well as the right frontal and temporal lobe. There is hemorrhagic transformation with mild-to-moderate blood in the infarct. There is local mass-effect and midline shift of 6 mm. No hydrocephalus. No evidence of chronic ischemia. No mass lesion. Vascular: Normal arterial flow voids at the base of brain. Skull and upper cervical spine: No focal skeletal abnormality. Sinuses/Orbits: Moderate mucosal edema paranasal sinuses. Bilateral mastoid effusion normal orbit Other: None MRA HEAD FINDINGS Both vertebral arteries are normal. Left PICA patent. Large right AICA patent. Basilar patent. Superior cerebellar and posterior cerebral arteries normal bilaterally. Internal carotid artery is widely patent bilaterally without stenosis or filling defect. Anterior and middle cerebral arteries widely patent bilaterally without stenosis or occlusion. No aneurysm. 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 Hemorrhagic transformation with mild to moderate hemorrhage in the infarct. There is mass-effect and 6 mm midline shift to left Negative MRA head  Electronically Signed   By: ChaFranchot GalloD.   On: 07/05/2020 10:01   MR BRAIN WO CONTRAST  Result Date: 07/05/2020 CLINICAL DATA:  Stroke.  Post thrombectomy and tPA EXAM: MRI HEAD WITHOUT CONTRAST MRA HEAD WITHOUT CONTRAST TECHNIQUE: Multiplanar, multiecho pulse sequences of the brain and surrounding structures were obtained without intravenous contrast. Angiographic images of the head were obtained using MRA technique without contrast. COMPARISON:  CT angio head and neck 07/04/2020 FINDINGS: MRI HEAD FINDINGS Brain: Acute infarct right MCA territory. This involves much of the right basal ganglia  as well as the right frontal and temporal lobe. There is hemorrhagic transformation with mild-to-moderate blood in the infarct. There is local mass-effect and midline shift of 6 mm. No hydrocephalus. No evidence of chronic ischemia. No mass lesion. Vascular: Normal arterial flow voids at the base of brain. Skull and upper cervical spine: No focal skeletal abnormality. Sinuses/Orbits: Moderate mucosal edema paranasal sinuses. Bilateral mastoid effusion normal orbit Other: None MRA HEAD FINDINGS Both vertebral arteries are normal. Left PICA patent. Large right AICA patent. Basilar patent. Superior cerebellar and posterior cerebral arteries normal bilaterally. Internal carotid artery is widely patent bilaterally without stenosis or filling defect. Anterior and middle cerebral arteries widely patent bilaterally without stenosis or occlusion. No aneurysm. 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 Hemorrhagic transformation with mild to moderate hemorrhage in the infarct. There is mass-effect and 6 mm midline shift to left Negative MRA head Electronically Signed   By: Franchot Gallo M.D.   On: 07/05/2020 10:01   IR CT Head Ltd  INDICATION: New onset of left-sided weakness, right gaze deviation and slurred speech.  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  EXAM: 1. EMERGENT LARGE VESSEL OCCLUSION THROMBOLYSIS (anterior CIRCULATION)  COMPARISON:  CT angiogram of the head and neck of July 04, 2020.  MEDICATIONS: Ancef 2 g IV antibiotic was administered within 1 hour of the procedure.  ANESTHESIA/SEDATION: General anesthesia  CONTRAST:  Isovue 300 105 mL.  FLUOROSCOPY TIME:  Fluoroscopy Time: 24 minutes 0 seconds (1208 mGy).  COMPLICATIONS: None immediate.  TECHNIQUE: Two physician emergency consent was obtained in view of the non availability of patient's family or next of kin physically or via phone.  The patient was then put under general anesthesia by the Department of Anesthesiology at [facility].  The right groin groin was prepped and draped in the usual sterile fashion. Thereafter using modified Seldinger technique, transfemoral access into the right common femoral artery was obtained without difficulty. Over a 0.035 inch guidewire an 8 French 25 cm Pinnacle sheath was inserted. Through this, and also over a 0.035 inch guidewire a 5 [place] JB 1 catheter was advanced to the aortic arch region and selectively positioned in the left common carotid artery, the right vertebral artery and the right common carotid artery.  FINDINGS: The left common carotid arteriogram demonstrates the left external carotid artery and its major branches to be widely patent.  The left internal carotid artery at the bulb to the cranial skull base is widely patent.  The petrous, the cavernous and the supraclinoid segments are widely patent.  There is an approximately 3 mm outpouching in the left posterior communicating artery region which may represent an infundibulum versus an aneurysm.  The left middle cerebral artery and the left anterior cerebral artery opacify into the capillary and venous phases. Prompt cross-filling via the anterior  communicating artery of the right anterior cerebral A2 segment and distally, and the right anterior cerebral A1 segment and the distal A2 region is noted.  The right common carotid arteriogram demonstrates the right external carotid artery to be occluded. There is a meniscal filling defect right at the origin of the right external carotid artery. Minimal extrusion was seen into the right internal carotid artery at the bulb.  More distally the right internal carotid artery is seen to opacify to the cranial skull base. Patency  is seen of the petrous and the caval and proximal cavernous segments. 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  The right vertebral artery origin is widely patent.  The vessel is seen to opacify to the cranial skull base. More distally patency is seen of the right vertebrobasilar junction and the right posterior-inferior cerebellar artery.  The basilar artery, the posterior cerebral arteries, the superior cerebellar arteries and the anterior-inferior cerebellar arteries demonstrate wide patency into the capillary and venous phases. 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  PROCEDURE: The diagnostic JB 1 catheter in right common carotid artery was then exchanged over a 0.035 inch 300 cm Rosen exchange guidewire for an 087 balloon guide catheter which was positioned in the mid cervical right ICA.  The guidewire was removed. Good aspiration obtained from the hub of the balloon guide catheter. A control arteriogram performed through this demonstrated no evidence of spasms, dissections or of intraluminal filling defects. Over a 0.014 inch standard Synchro micro guidewire with a J configuration the combination of an 071 136 cm Zoom aspiration catheter inside of which was an 021 162 cm Trevo ProVue microcatheter was advanced to the level of the  ipsilateral ophthalmic artery.  Thereafter, the micro guidewire was gently manipulated without difficulty through the occluded right internal carotid artery supraclinoid segment into the right middle cerebral artery and into the M2 M3 segment of the inferior division of the right middle cerebral artery. This was followed by advancement of the microcatheter. The guidewire was removed. Good aspiration obtained from the hub of the microcatheter. A gentle control arteriogram performed through this demonstrated antegrade flow into the distal M3 M4 segment of the inferior division of the right middle cerebral artery. This was then connected to continuous heparinized saline infusion.  A 5 mm x 37 mm Embotrap retrieval device was then advanced to the distal end of the microcatheter and deployed in the usual manner. The Zoom aspiration catheter was advanced such that it was in the distal occluded right middle cerebral artery. With constant aspiration being applied at the hub of the Zoom aspiration catheter with a Penumbra aspiration device, and with a 20 mL syringe at the hub of the balloon guide, and proximal flow arrest over 2 minutes, the combination of the retrieval device, microcatheter, and the Zoom aspiration catheter were retrieved and removed. Following reversal of flow arrest, a control arteriogram performed through the balloon guide in the right internal carotid artery demonstrated complete revascularization of the occluded right internal carotid artery supraclinoid segment, the right middle cerebral artery superior and inferior anterior temporal branches. However, noted were occlusions of the M3 region of the superior division in its two perisylvian branches, and also of the inferior division in the distal M3 segment.  Spasm in the right internal carotid artery was treated with 2 aliquots of 25 mcg of nitroglycerin with relief of the vasospasm.  Control arteriogram performed through the balloon guide in the  right internal carotid artery demonstrated continued patency of the superior and the inferior divisions with no change in the occlusion of the 2 anterior perisylvian branches of the superior division, and also of the inferior division of the M3 region which over a period of time demonstrated improved revascularization into the M4 regions.  A TICI 2b revascularization was achieved.  Given the gradual revascularization of the inferior division, no further endovascular treatment was attempted.  The balloon guide was  removed. An 8 French Angio-Seal closure device applied successfully at the right groin puncture site. Distal pulses continued to be Dopplerable in both feet unchanged.  An intermediate CT of the brain demonstrated contrast stain +/-petechial hemorrhages in the posterior putamen on the right extending to the caudate head.  No mass effect or midline shift was noted.  The patient was then transferred to neuro ICU intubated for post revascularization care.  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  Persistent distal occlusion of the superior division anterior perisylvian branches, with gradual improvement revascularization of the dominant inferior division in the M3 M4 regions.  PLAN: Follow-up as per referring neurologist.   Electronically Signed   By: Luanne Bras M.D.   On: 07/05/2020 14:22  IR CT Head Ltd  INDICATION: New onset of left-sided weakness, right gaze deviation and slurred speech. 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 EXAM: 1. EMERGENT LARGE VESSEL OCCLUSION THROMBOLYSIS (anterior CIRCULATION) COMPARISON:  CT angiogram of the head and neck of July 04, 2020. MEDICATIONS: Ancef 2 g  IV antibiotic was administered within 1 hour of the procedure. ANESTHESIA/SEDATION: General anesthesia CONTRAST:  Isovue 300 105 mL. FLUOROSCOPY TIME:  Fluoroscopy Time: 24 minutes 0 seconds (1208 mGy). COMPLICATIONS: None immediate. TECHNIQUE: Two physician emergency consent was obtained in view of the non availability of patient's family or next of kin physically or via phone. The patient was then put under general anesthesia by the Department of Anesthesiology at [facility]. The right groin groin was prepped and draped in the usual sterile fashion. Thereafter using modified Seldinger technique, transfemoral access into the right common femoral artery was obtained without difficulty. Over a 0.035 inch guidewire an 8 French 25 cm Pinnacle sheath was inserted. Through this, and also over a 0.035 inch guidewire a 5 [place] JB 1 catheter was advanced to the aortic arch region and selectively positioned in the left common carotid artery, the right vertebral artery and the right common carotid artery. FINDINGS: The left common carotid arteriogram demonstrates the left external carotid artery and its major branches to be widely patent. The left internal carotid artery at the bulb to the cranial skull base is widely patent. The petrous, the cavernous and the supraclinoid segments are widely patent. There is an approximately 3 mm outpouching in the left posterior communicating artery region which may represent an infundibulum versus an aneurysm. The left middle cerebral artery and the left anterior cerebral artery opacify into the capillary and venous phases. Prompt cross-filling via the anterior communicating artery of the right anterior cerebral A2 segment and distally, and the right anterior cerebral A1 segment and the distal A2 region is noted. The right common carotid arteriogram demonstrates the right external carotid artery to be occluded. There is a meniscal filling defect right at the origin of the right  external carotid artery. Minimal extrusion was seen into the right internal carotid artery at the bulb. More distally the right internal carotid artery is seen to opacify to the cranial skull base. Patency is seen of the petrous and the caval and proximal cavernous segments. 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 The right vertebral artery origin is widely patent. The vessel is seen to opacify to the cranial skull base.  More distally patency is seen of the right vertebrobasilar junction and the right posterior-inferior cerebellar artery. The basilar artery, the posterior cerebral arteries, the superior cerebellar arteries and the anterior-inferior cerebellar arteries demonstrate wide patency into the capillary and venous phases. 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 PROCEDURE: The diagnostic JB 1 catheter in right common carotid artery was then exchanged over a 0.035 inch 300 cm Rosen exchange guidewire for an 087 balloon guide catheter which was positioned in the mid cervical right ICA. The guidewire was removed. Good aspiration obtained from the hub of the balloon guide catheter. A control arteriogram performed through this demonstrated no evidence of spasms, dissections or of intraluminal filling defects. Over a 0.014 inch standard Synchro micro guidewire with a J configuration the combination of an 071 136 cm Zoom aspiration catheter inside of which was an 021 162 cm Trevo ProVue microcatheter was advanced to the level of the ipsilateral ophthalmic artery. Thereafter, the micro guidewire was gently manipulated without difficulty through the occluded right internal carotid artery supraclinoid segment into the right middle cerebral artery and into the M2 M3 segment of the inferior division of the right middle cerebral artery. This was followed by advancement of the  microcatheter. The guidewire was removed. Good aspiration obtained from the hub of the microcatheter. A gentle control arteriogram performed through this demonstrated antegrade flow into the distal M3 M4 segment of the inferior division of the right middle cerebral artery. This was then connected to continuous heparinized saline infusion. A 5 mm x 37 mm Embotrap retrieval device was then advanced to the distal end of the microcatheter and deployed in the usual manner. The Zoom aspiration catheter was advanced such that it was in the distal occluded right middle cerebral artery. With constant aspiration being applied at the hub of the Zoom aspiration catheter with a Penumbra aspiration device, and with a 20 mL syringe at the hub of the balloon guide, and proximal flow arrest over 2 minutes, the combination of the retrieval device, microcatheter, and the Zoom aspiration catheter were retrieved and removed. Following reversal of flow arrest, a control arteriogram performed through the balloon guide in the right internal carotid artery demonstrated complete revascularization of the occluded right internal carotid artery supraclinoid segment, the right middle cerebral artery superior and inferior anterior temporal branches. However, noted were occlusions of the M3 region of the superior division in its two perisylvian branches, and also of the inferior division in the distal M3 segment. Spasm in the right internal carotid artery was treated with 2 aliquots of 25 mcg of nitroglycerin with relief of the vasospasm. Control arteriogram performed through the balloon guide in the right internal carotid artery demonstrated continued patency of the superior and the inferior divisions with no change in the occlusion of the 2 anterior perisylvian branches of the superior division, and also of the inferior division of the M3 region which over a period of time demonstrated improved revascularization into the M4 regions. A TICI 2b  revascularization was achieved. Given the gradual revascularization of the inferior division, no further endovascular treatment was attempted. The balloon guide was removed. An 8 French Angio-Seal closure device applied successfully at the right groin puncture site. Distal pulses continued to be Dopplerable in both feet unchanged. An intermediate CT of the brain demonstrated contrast stain +/-petechial hemorrhages in the posterior putamen on the right extending to the caudate head. No mass effect or midline shift was noted.  The patient was then transferred to neuro ICU intubated for post revascularization care. 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 Persistent distal occlusion of the superior division anterior perisylvian branches, with gradual improvement revascularization of the dominant inferior division in the M3 M4 regions. PLAN: Follow-up as per referring neurologist. Electronically Signed   By: Luanne Bras M.D.   On: 07/05/2020 14:22   CT CEREBRAL PERFUSION W CONTRAST  Result Date: 07/04/2020 CLINICAL DATA:  Left-sided weakness and slurred speech EXAM: CT HEAD WITHOUT CONTRAST CT ANGIOGRAPHY HEAD AND NECK CT PERFUSION BRAIN TECHNIQUE: Multi detector CT imaging of the head was performed using the standard protocol without intravenous contrast. Multidetector CT imaging of the head and neck was performed using the standard protocol during bolus administration of intravenous contrast. Multiplanar CT image reconstructions and MIPs were obtained to evaluate the vascular anatomy. Carotid stenosis measurements (when applicable) are obtained utilizing NASCET criteria, using the distal internal carotid diameter as the denominator. Multiphase CT imaging of the brain was performed following IV bolus contrast injection. Subsequent  parametric perfusion maps were calculated using RAPID software. CONTRAST:  139m OMNIPAQUE IOHEXOL 350 MG/ML SOLN COMPARISON:  None. FINDINGS: CT HEAD FINDINGS Brain: There is no acute intracranial hemorrhage or mass effect. Loss of gray-white differentiation involving a portion of the right lentiform nucleus. Ventricles and sulci are normal in size and configuration. No extra-axial collection. Vascular: No hyperdense vessel. Skull: Unremarkable. Sinuses/Orbits: Lobular maxillary sinus mucosal thickening. Orbits are unremarkable. Other: Minor patchy left mastoid tip opacification. ASPECTS ([facility] - Ganglionic level infarction (caudate, lentiform nuclei, internal capsule, insula, M1-M3 cortex): 6 - Supraganglionic infarction (M4-M6 cortex): 3 Total score (0-10 with 10 being normal): 9 CTA NECK FINDINGS Aortic arch: Normal caliber aorta.  Great vessel origins are patent. Right carotid system: Common carotid is patent. There is thrombus at the bifurcation extending into the external carotid. Less than 50% stenosis at the ICA origin. The cervical ICA is relatively smaller in caliber. Left carotid system: Patent.  No stenosis. Vertebral arteries: Patent. Right vertebral artery slightly dominant. No stenosis. Skeleton: Unremarkable. Other neck: Thyroid not well evaluated due to streak artifact. Likely reactive cervical lymph nodes. Upper chest: Included lungs are clear. Endotracheal tube is present. Review of the MIP images confirms the above findings CTA HEAD FINDINGS Anterior circulation: Proximal intracranial internal carotid artery is patent. There is loss of enhancement at the level of the supraclinoid portion. Faint enhancement is seen along the right M1 MCA. There is reconstitution of M2 branches. Patent right A1 ACA. Anterior communicating artery is present. Left anterior and middle cerebral arteries are patent. Posterior circulation: Intracranial vertebral arteries, basilar artery,  and posterior cerebral arteries are patent. Venous sinuses: As permitted by contrast timing, patent. Review of the MIP images confirms the above findings CT Brain Perfusion Findings: CBF (<30%) Volume: 564mPerfusion (Tmax>6.0s) volume: 11423mismatch Volume: 63m67mfarction Location: Right MCA territory IMPRESSION: No acute intracranial hemorrhage. 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 Reconstitution of right MCA at the bifurcation. Right A1 ACA is patent. Anterior communicating artery is present. No posterior communicating artery is identified. Perfusion imaging demonstrates core infarction of 51 mL. This  is greater than anticipated by noncontrast CT. Penumbra is 63 mL and therefore may be greater than calculated. Initial results were called by telephone at the time of interpretation on 07/04/2020 at 1:58 pm to provider Dr. Curly Shores, who verbally acknowledged these results. Electronically Signed   By: Macy Mis M.D.   On: 07/04/2020 14:10   DG CHEST PORT 1 VIEW  Result Date: 07/18/2020 CLINICAL DATA:  Feeding tube dysfunction. EXAM: PORTABLE CHEST 1 VIEW COMPARISON:  07/16/2020 FINDINGS: Single view of the chest demonstrates a feeding tube that extends into the abdomen. The tube extends beyond the image. Both lungs are clear. Heart size is normal. Negative for a pneumothorax. IMPRESSION: 1. No acute chest abnormality. 2. Feeding tube extends into the abdomen but the tip is beyond the image. Electronically Signed   By: Markus Daft M.D.   On: 07/18/2020 10:52   DG CHEST PORT 1 VIEW  Result Date: 07/16/2020 CLINICAL DATA:  Eval for aspiration EXAM: PORTABLE CHEST 1 VIEW COMPARISON:  July 13, 2020 FINDINGS: The heart size and mediastinal contours are  within normal limits. Slight interval improvement in the left perihilar airspace opacities. The right lung is clear. No pleural effusion. Enteric tube is seen below the diaphragm. IMPRESSION: Slight interval improvement in the left perihilar airspace opacities Electronically Signed   By: Prudencio Pair M.D.   On: 07/16/2020 18:45   DG Chest Port 1 View  Result Date: 07/06/2020 CLINICAL DATA:  Post central line placement. EXAM: PORTABLE CHEST 1 VIEW COMPARISON:  Chest radiograph July 04, 2020. FINDINGS: Interval placement of a left upper extremity PICC with tip overlying the superior cavoatrial junction. Interval advancement of the endotracheal tube with tip now overlying the distal thoracic trachea 1.6 cm from the carina. Enteric tube coursing below the diaphragm with tip obscured by collimation. The heart size and mediastinal contours are unchanged. No focal consolidation. No visible pneumothorax. The visualized skeletal structures are unremarkable. IMPRESSION: 1. Interval placement of a left upper extremity PICC with tip overlying the superior cavoatrial junction. No visible pneumothorax. 2. Interval advancement of the endotracheal tube with tip now overlying the distal thoracic trachea. Electronically Signed   By: Dahlia Bailiff MD   On: 07/06/2020 19:12   DG Abd Portable 1V  Result Date: 07/18/2020 CLINICAL DATA:  Feeding tube dysfunction, nurse reports choking EXAM: PORTABLE ABDOMEN - 1 VIEW COMPARISON:  None. FINDINGS: Two partial images of the abdomen are submitted for review, one of two demonstrating non weighted enteric feeding tube tip projecting over the gastric fundus. A substantial portion of the right hemiabdomen is excluded from images submitted for review. Nonobstructive pattern of bowel gas. IMPRESSION: Two partial images of the abdomen are submitted for review, one of two demonstrating non weighted enteric feeding tube tip projecting over the gastric fundus. Electronically Signed   By: Eddie Candle M.D.   On: 07/18/2020 10:52   DG Abd Portable 1 View  Result Date: 07/04/2020 CLINICAL DATA:  OG tube placement. EXAM: PORTABLE ABDOMEN - 1 VIEW COMPARISON:  No prior. FINDINGS: OG tube noted with tip over the stomach. No gastric or bowel distention noted. Contrast noted both kidneys. Degenerative change lumbar spine. Surgical clips right upper quadrant. IMPRESSION: OG tube noted with tip over the stomach. No gastric or bowel distention noted. Electronically Signed   By: Marcello Moores  Register   On: 07/04/2020 15:13   DG Swallowing Func-Speech Pathology  Result Date: 07/20/2020 Objective Swallowing Evaluation: Type of Study: MBS-Modified Barium Swallow Study  Patient  Details Name: JAHNAY LANTIER MRN: [id number] Date of Birth: 11/20/1985 Today's Date: 07/20/2020 Time: SLP Start Time (ACUTE ONLY): 4268 -SLP Stop Time (ACUTE ONLY): 0856 SLP Time Calculation (min) (ACUTE ONLY): 18 min Past Medical History: Past Medical History: Diagnosis Date . Diabetes mellitus without complication ([place])  . Hypertension  Past Surgical History: Past Surgical History: Procedure Laterality Date . CESAREAN SECTION   . CHOLECYSTECTOMY   . CRANIOTOMY Right 07/06/2020  Procedure: RIGHT CRANIECTOMY WITH PLACEMENT OF BONE FLAP IN ABDOMEN;  Surgeon: Erline Levine, MD;  Location: [facility];  Service: Neurosurgery;  Laterality: Right; . IR ANGIO INTRA EXTRACRAN SEL COM CAROTID INNOMINATE UNI L MOD SED  07/04/2020 . IR ANGIO VERTEBRAL SEL VERTEBRAL UNI R MOD SED  07/04/2020 . IR CT HEAD LTD  07/04/2020 . IR CT HEAD LTD  07/04/2020 . IR PERCUTANEOUS ART THROMBECTOMY/INFUSION INTRACRANIAL INC DIAG ANGIO  07/04/2020 . RADIOLOGY WITH ANESTHESIA N/A 07/04/2020  Procedure: IR WITH ANESTHESIA;  Surgeon: Radiologist, Medication, MD;  Location: [facility];  Service: Radiology;  Laterality: N/A; . THROMBECTOMY BRACHIAL ARTERY Right 07/05/2020  Procedure: RIGHT UPPER EXTREMITY THROMBECTOMY;  Surgeon: Marty Heck, MD;  Location: [facility] OR;  Service: Vascular;   Laterality: Right; HPI: 35 yo female admitted with L sided weakness, given TPA for R MCA involving basal ganglia, frontal, temporal lobes  3/23 s/p BIL revascularization  R MCA ACA achieving 2B revascularization. Pt intubated 3/23-31. 3/24 R UE thrombectomy. CT revealed worsening midline shift. 3/25 decompressive hemicranitectomy, R side bone flap in abdomen. PMH current smoker uncontrolled DM HTn  Subjective: -- (adequately awake) Assessment / Plan / Recommendation CHL IP CLINICAL IMPRESSIONS 07/20/2020 Clinical Impression Although Rabiah was adequately alert for MBS, protective measures for airway closure were late allowing barium to enter vestibule without exiting. Nectar and honey thick aggregated in pyriform sinuses for 6-9 seconds and fell onto vocal cords during the swallow. There was sensation as evidenced by strong cough with incomplete expulsion. Sontee's cognitive impairments, primarily decreased ability to attend to bolus transfer exacerbated timing and coordination. Of note, pt's closure and coordination were improved with two trials/swallows avoiding penetration. Viscocity of pudding allowed laryngeal closure to occur prior to proluplsion of bolus into hypopharynx and did not enter vstibule. Compliance of strategy cannot be relied upon at this point and recommend re-placement of alternative feeding tube. Therapist intervention will target trials of pudding and increase attention, transit and swallow initiation and hopefully can avoid longer term means of nutrition however, uncertain at this time. SLP Visit Diagnosis Dysphagia, oropharyngeal phase (R13.12) Attention and concentration deficit following -- Frontal lobe and executive function deficit following -- Impact on safety and function Severe aspiration risk   CHL IP TREATMENT RECOMMENDATION 07/20/2020 Treatment Recommendations Therapy as outlined in treatment plan below   Prognosis 07/20/2020 Prognosis for Safe Diet Advancement Good Barriers to Reach  Goals Severity of deficits Barriers/Prognosis Comment -- CHL IP DIET RECOMMENDATION 07/20/2020 SLP Diet Recommendations NPO Liquid Administration via -- Medication Administration Via alternative means Compensations -- Postural Changes --   CHL IP OTHER RECOMMENDATIONS 07/20/2020 Recommended Consults -- Oral Care Recommendations Oral care QID Other Recommendations --   CHL IP FOLLOW UP RECOMMENDATIONS 07/20/2020 Follow up Recommendations Inpatient Rehab   CHL IP FREQUENCY AND DURATION 07/20/2020 Speech Therapy Frequency (ACUTE ONLY) min 2x/week Treatment Duration 2 weeks      CHL IP ORAL PHASE 07/20/2020 Oral Phase Impaired Oral - Pudding Teaspoon -- Oral - Pudding Cup -- Oral - Honey Teaspoon -- Oral - Honey Cup  Weak lingual manipulation;Reduced posterior propulsion;Delayed oral transit;Decreased bolus cohesion Oral - Nectar Teaspoon -- Oral - Nectar Cup Delayed oral transit;Weak lingual manipulation;Reduced posterior propulsion Oral - Nectar Straw -- Oral - Thin Teaspoon -- Oral - Thin Cup -- Oral - Thin Straw -- Oral - Puree Delayed oral transit Oral - Mech Soft -- Oral - Regular -- Oral - Multi-Consistency -- Oral - Pill -- Oral Phase - Comment --  CHL IP PHARYNGEAL PHASE 07/20/2020 Pharyngeal Phase Impaired Pharyngeal- Pudding Teaspoon -- Pharyngeal -- Pharyngeal- Pudding Cup -- Pharyngeal -- Pharyngeal- Honey Teaspoon -- Pharyngeal -- Pharyngeal- Honey Cup Delayed swallow initiation-pyriform sinuses;Penetration/Aspiration before swallow Pharyngeal Material enters airway, CONTACTS cords and not ejected out Pharyngeal- Nectar Teaspoon -- Pharyngeal -- Pharyngeal- Nectar Cup Delayed swallow initiation-pyriform sinuses;Penetration/Aspiration during swallow Pharyngeal -- Pharyngeal- Nectar Straw -- Pharyngeal -- Pharyngeal- Thin Teaspoon -- Pharyngeal -- Pharyngeal- Thin Cup -- Pharyngeal -- Pharyngeal- Thin Straw -- Pharyngeal -- Pharyngeal- Puree Delayed swallow initiation-vallecula Pharyngeal -- Pharyngeal- Mechanical Soft  -- Pharyngeal -- Pharyngeal- Regular -- Pharyngeal -- Pharyngeal- Multi-consistency -- Pharyngeal -- Pharyngeal- Pill -- Pharyngeal -- Pharyngeal Comment --  CHL IP CERVICAL ESOPHAGEAL PHASE 07/20/2020 Cervical Esophageal Phase WFL Pudding Teaspoon -- Pudding Cup -- Honey Teaspoon -- Honey Cup -- Nectar Teaspoon -- Nectar Cup -- Nectar Straw -- Thin Teaspoon -- Thin Cup -- Thin Straw -- Puree -- Mechanical Soft -- Regular -- Multi-consistency -- Pill -- Cervical Esophageal Comment -- [place] Siren 07/20/2020, 11:36 AM  Orbie Pyo Colvin Caroli.Ed Actor Pager [phone] Office [phone]             VAS [place] TRANSCRANIAL DOPPLER W BUBBLES  Result Date: 07/10/2020  Transcranial Doppler with Bubble Indications: Subarachnoid hemorrhage. Performing Technologist: Abram Sander RVS  Examination Guidelines: A complete evaluation includes B-mode imaging, spectral Doppler, color Doppler, and power Doppler as needed of all accessible portions of each vessel. Bilateral testing is considered an integral part of a complete examination. Limited examinations for reoccurring indications may be performed as noted.  Summary: No HITS at rest or during Valsalva. Negative transcranial Doppler Bubble study with no evidence of right to left intracardiac communication.  A vascular evaluation was performed. The left middle cerebral artery was studied. An IV was inserted into the patient's left forearm . Verbal informed consent was obtained.  Negative TCD Bubble study *See table(s) above for TCD measurements and observations.  Diagnosing physician: Antony Contras MD Electronically signed by Antony Contras MD on 07/10/2020 at 2:21:29 PM.    Final    VAS [place] UPPER EXTREMITY ARTERIAL DUPLEX  Result Date: 07/15/2020 UPPER EXTREMITY DUPLEX STUDY Indications: Patient complains of Cool, purple fingers, wound on right palm,              recent right upper extremity thrombectomy. History:     Patient has a history of upper  extremity pain.  Limitations: Patient confusion, constant movement of right arm. Comparison Study: Prior study done 07/05/20 Performing Technologist: Sharion Dove RVS  Examination Guidelines: A complete evaluation includes B-mode imaging, spectral Doppler, color Doppler, and power Doppler as needed of all accessible portions of each vessel. Bilateral testing is considered an integral part of a complete examination. Limited examinations for reoccurring indications may be performed as noted.  Right Doppler Findings: +---------------+----------+-------------------+--------+------------------+ Site           PSV (cm/s)Waveform           StenosisComments           +---------------+----------+-------------------+--------+------------------+ Subclavian Prox52  triphasic                                     +---------------+----------+-------------------+--------+------------------+ Subclavian Mid -49       triphasic                                     +---------------+----------+-------------------+--------+------------------+ Subclavian Dist          triphasic                                     +---------------+----------+-------------------+--------+------------------+ Axillary       48        triphasic                                     +---------------+----------+-------------------+--------+------------------+ Brachial Prox  37        triphasic                                     +---------------+----------+-------------------+--------+------------------+ Brachial Mid   31        triphasic                                     +---------------+----------+-------------------+--------+------------------+ Brachial Dist  -29       triphasic                                     +---------------+----------+-------------------+--------+------------------+ Radial Mid     60        triphasic                                      +---------------+----------+-------------------+--------+------------------+ Radial Dist    53        triphasic                                     +---------------+----------+-------------------+--------+------------------+ Ulnar Mid                dampened monophasic        partially occluded +---------------+----------+-------------------+--------+------------------+ Palmar Arch              dampened monophasic        minimal flow noted +---------------+----------+-------------------+--------+------------------+    Summary:  Right: Right ulnar artery appears occluded. *See table(s) above for measurements and observations. Electronically signed by Monica Martinez MD on 07/15/2020 at 4:21:03 PM.    Final    VAS [place] UPPER EXTREMITY ARTERIAL DUPLEX  Result Date: 07/06/2020 UPPER EXTREMITY DUPLEX STUDY Indications: Patient complains of RUE - cool to touch.  Risk Factors: Prior CVA. Performing Technologist: Rogelia Rohrer  Examination Guidelines: A complete evaluation includes B-mode imaging, spectral Doppler, color Doppler, and power Doppler as needed of all accessible portions of each vessel. Bilateral testing is considered an integral part of a complete examination. Limited  examinations for reoccurring indications may be performed as noted.  Right Doppler Findings: +---------------+----------+----------+--------+--------+ Site           PSV (cm/s)Waveform  StenosisComments +---------------+----------+----------+--------+--------+ Subclavian Prox81        triphasic                  +---------------+----------+----------+--------+--------+ Subclavian Dist          absent                     +---------------+----------+----------+--------+--------+ Axillary       63        triphasic                  +---------------+----------+----------+--------+--------+ Brachial Prox  51        biphasic                   +---------------+----------+----------+--------+--------+ Brachial  Mid   33        biphasic                   +---------------+----------+----------+--------+--------+ Brachial Dist  14        monophasicoccluded         +---------------+----------+----------+--------+--------+ Radial Prox    0         absent    occluded         +---------------+----------+----------+--------+--------+ Radial Mid     0         absent    occluded         +---------------+----------+----------+--------+--------+ Radial Dist    0         absent    occluded         +---------------+----------+----------+--------+--------+ Ulnar Prox     0         absent    occluded         +---------------+----------+----------+--------+--------+ Ulnar Mid      0         absent                     +---------------+----------+----------+--------+--------+ Ulnar Dist     0         absent                     +---------------+----------+----------+--------+--------+ Incidental finding: RUE cephalic SVT.   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 Vascular consult recommended. Electronically signed by Jamelle Haring on 07/06/2020 at 7:04:01 PM.    Final    ECHOCARDIOGRAM COMPLETE  Result Date: 07/05/2020    ECHOCARDIOGRAM REPORT   Patient Name:   SHANDON MATSON Thorley Date of Exam: 07/05/2020 Medical Rec #:  [id number]       Height:       65.0 in Accession #:    [id number]      Weight:       159.2 lb Date of Birth:  1986/02/03      BSA:          1.795 m Patient Age:    34 years        BP:           130/91 mmHg Patient Gender: F               HR:           115 bpm. Exam Location:  Inpatient Procedure: 2D Echo, Cardiac Doppler and Color Doppler Indications:    Stroke 434.91  History:        Patient has no prior history of Echocardiogram examinations.                 Risk Factors:Hypertension and Diabetes.  Sonographer:    Jonelle Sidle Dance Referring Phys: [place]  1. Left ventricular ejection fraction, by estimation, is 60 to 65%. The left ventricle has normal function. The left ventricle has no regional wall motion abnormalities. Left ventricular diastolic parameters are consistent with Grade I diastolic dysfunction (impaired relaxation).  2. Right ventricular systolic function is normal. The right ventricular size is normal.  3. The mitral valve is normal in structure. Trivial mitral valve regurgitation. No evidence of mitral stenosis.  4. The aortic valve is normal in structure. Aortic valve regurgitation is not visualized. No aortic stenosis is present.  5. The inferior vena cava is normal in size with greater than 50% respiratory variability, suggesting right atrial pressure of 3 mmHg. Conclusion(s)/Recommendation(s): No intracardiac source of embolism detected on this transthoracic study. A transesophageal echocardiogram is recommended to exclude cardiac source of embolism if clinically indicated. FINDINGS  Left Ventricle: Left ventricular ejection fraction, by estimation, is 60 to 65%. The left ventricle has normal function. The left ventricle has no regional wall motion abnormalities. The left ventricular internal cavity size was normal in size. There is  no left ventricular hypertrophy. Left ventricular diastolic parameters are consistent with Grade I diastolic dysfunction (impaired relaxation). Normal left ventricular filling pressure. Right Ventricle: The right ventricular size is normal. No increase in right ventricular wall thickness. Right ventricular systolic function is normal. Left Atrium: Left atrial size was normal in size. Right Atrium: Right atrial size was normal in size. Pericardium: There is no evidence of pericardial effusion. Mitral Valve: The mitral valve is normal in structure. Trivial mitral valve regurgitation. No evidence of mitral valve stenosis. Tricuspid Valve: The tricuspid valve is normal in structure. Tricuspid valve  regurgitation is trivial. No evidence of tricuspid stenosis. Aortic Valve: The aortic valve is normal in structure. Aortic valve regurgitation is not visualized. No aortic stenosis is present. Pulmonic Valve: The pulmonic valve was normal in structure. Pulmonic valve regurgitation is not visualized. No evidence of pulmonic stenosis. Aorta: The aortic root is normal in size and structure. Venous: The inferior vena cava is normal in size with greater than 50% respiratory variability, suggesting right atrial pressure of 3 mmHg. IAS/Shunts: No atrial level shunt detected by color flow Doppler.  LEFT VENTRICLE PLAX 2D LVIDd:         4.20 cm  Diastology LVIDs:         3.20 cm  LV e' medial:    10.30 cm/s LV PW:         0.90 cm  LV E/e' medial:  8.5 LV IVS:        0.80 cm  LV e' lateral:   15.00 cm/s LVOT diam:     2.10 cm  LV E/e' lateral: 5.9 LV SV:         55 LV SV Index:   31 LVOT Area:     3.46 cm  RIGHT VENTRICLE             IVC RV Basal diam:  2.40 cm     IVC diam: 1.40 cm RV S prime:     13.40 cm/s TAPSE (M-mode): 2.2 cm LEFT ATRIUM  Index       RIGHT ATRIUM          Index LA diam:        2.70 cm 1.50 cm/m  RA Area:     8.48 cm LA Vol (A2C):   27.5 ml 15.32 ml/m RA Volume:   17.50 ml 9.75 ml/m LA Vol (A4C):   18.7 ml 10.42 ml/m LA Biplane Vol: 23.2 ml 12.92 ml/m  AORTIC VALVE LVOT Vmax:   99.20 cm/s LVOT Vmean:  66.100 cm/s LVOT VTI:    0.160 m  AORTA Ao Root diam: 3.20 cm Ao Asc diam:  2.60 cm MITRAL VALVE MV Area (PHT): 2.97 cm    SHUNTS MV Decel Time: 256 msec    Systemic VTI:  0.16 m MV E velocity: 88.05 cm/s  Systemic Diam: 2.10 cm Ena Dawley MD Electronically signed by Ena Dawley MD Signature Date/Time: 07/05/2020/12:43:53 PM    Final    ECHO TEE  Result Date: 07/19/2020    TRANSESOPHOGEAL ECHO REPORT   Patient Name:   Raymie L Lacomb Date of Exam: 07/19/2020 Medical Rec #:  [id number]       Height:       65.0 in Accession #:    [id number]      Weight:       159.8 lb Date of Birth:   03/20/1986      BSA:          1.798 m Patient Age:    34 years        BP:           134/87 mmHg Patient Gender: F               HR:           101 bpm. Exam Location:  Inpatient Procedure: Transesophageal Echo, 3D Echo, Cardiac Doppler, Color Doppler and            Saline Contrast Bubble Study Indications:    Stroke  History:        Patient has prior history of Echocardiogram examinations, most                 recent 07/05/2020. Risk Factors:Hypertension and Diabetes.  Sonographer:    Bernadene Person RDCS Referring Phys: [id number] [place]: After discussion of the risks and benefits of a TEE, an informed consent was obtained. The transesophogeal probe was passed without difficulty through the esophogus of the patient. Sedation performed by different physician. The patient was monitored while under deep sedation. Anesthestetic sedation was provided intravenously by Anesthesiology: 206.71m of Propofol, 851mof Lidocaine. The patient's vital signs; including heart rate, blood pressure, and oxygen saturation; remained stable throughout the procedure. The patient developed no complications during the procedure. IMPRESSIONS  1. Left ventricular ejection fraction, by estimation, is 50 to 55%. The left ventricle has low normal function.  2. Right ventricular systolic function is normal. The right ventricular size is normal.  3. No left atrial/left atrial appendage thrombus was detected.  4. The mitral valve is normal in structure. No evidence of mitral valve regurgitation.  5. The aortic valve is tricuspid. Aortic valve regurgitation is trivial. No aortic stenosis is present.  6. Agitated saline contrast bubble study was negative, with no evidence of any interatrial shunt. FINDINGS  Left Ventricle: Left ventricular ejection fraction, by estimation, is 50 to 55%. The left ventricle has low normal function. The left ventricular internal cavity size was normal in size. Right Ventricle: The  right  ventricular size is normal. No increase in right ventricular wall thickness. Right ventricular systolic function is normal. Left Atrium: Left atrial size was normal in size. No left atrial/left atrial appendage thrombus was detected. Right Atrium: Right atrial size was normal in size. Pericardium: There is no evidence of pericardial effusion. Mitral Valve: The mitral valve is normal in structure. No evidence of mitral valve regurgitation. Tricuspid Valve: The tricuspid valve is normal in structure. Tricuspid valve regurgitation is trivial. Aortic Valve: The aortic valve is tricuspid. Aortic valve regurgitation is trivial. No aortic stenosis is present. Pulmonic Valve: The pulmonic valve was grossly normal. Pulmonic valve regurgitation is not visualized. Aorta: The aortic root is normal in size and structure. IAS/Shunts: No atrial level shunt detected by color flow Doppler. Agitated saline contrast was given intravenously to evaluate for intracardiac shunting. Agitated saline contrast bubble study was negative, with no evidence of any interatrial shunt. Oswaldo Milian MD Electronically signed by Oswaldo Milian MD Signature Date/Time: 07/19/2020/5:22:36 PM    Final    IR PERCUTANEOUS ART THROMBECTOMY/INFUSION INTRACRANIAL INC DIAG ANGIO  Result Date: 07/06/2020 INDICATION: New onset of left-sided weakness, right gaze deviation and slurred speech. 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 EXAM: 1. EMERGENT LARGE VESSEL OCCLUSION THROMBOLYSIS (anterior CIRCULATION) COMPARISON:  CT angiogram of the head and neck of July 04, 2020. MEDICATIONS: Ancef 2 g IV antibiotic was administered within 1 hour of the procedure. ANESTHESIA/SEDATION: General anesthesia CONTRAST:  Isovue 300 105 mL. FLUOROSCOPY TIME:  Fluoroscopy Time: 24 minutes 0 seconds (1208 mGy). COMPLICATIONS: None immediate.  TECHNIQUE: Two physician emergency consent was obtained in view of the non availability of patient's family or next of kin physically or via phone. The patient was then put under general anesthesia by the Department of Anesthesiology at [facility]. The right groin groin was prepped and draped in the usual sterile fashion. Thereafter using modified Seldinger technique, transfemoral access into the right common femoral artery was obtained without difficulty. Over a 0.035 inch guidewire an 8 French 25 cm Pinnacle sheath was inserted. Through this, and also over a 0.035 inch guidewire a 5 [place] JB 1 catheter was advanced to the aortic arch region and selectively positioned in the left common carotid artery, the right vertebral artery and the right common carotid artery. FINDINGS: The left common carotid arteriogram demonstrates the left external carotid artery and its major branches to be widely patent. The left internal carotid artery at the bulb to the cranial skull base is widely patent. The petrous, the cavernous and the supraclinoid segments are widely patent. There is an approximately 3 mm outpouching in the left posterior communicating artery region which may represent an infundibulum versus an aneurysm. The left middle cerebral artery and the left anterior cerebral artery opacify into the capillary and venous phases. Prompt cross-filling via the anterior communicating artery of the right anterior cerebral A2 segment and distally, and the right anterior cerebral A1 segment and the distal A2 region is noted. The right common carotid arteriogram demonstrates the right external carotid artery to be occluded. There is a meniscal filling defect right at the origin of the right external carotid artery. Minimal extrusion was seen into the right internal carotid artery at the bulb. More distally the right internal carotid artery is seen to opacify to the cranial skull base. Patency is seen of the petrous and  the  caval and proximal cavernous segments. 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 The right vertebral artery origin is widely patent. The vessel is seen to opacify to the cranial skull base. More distally patency is seen of the right vertebrobasilar junction and the right posterior-inferior cerebellar artery. The basilar artery, the posterior cerebral arteries, the superior cerebellar arteries and the anterior-inferior cerebellar arteries demonstrate wide patency into the capillary and venous phases. 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 PROCEDURE: The diagnostic JB 1 catheter in right common carotid artery was then exchanged over a 0.035 inch 300 cm Rosen exchange guidewire for an 087 balloon guide catheter which was positioned in the mid cervical right ICA. The guidewire was removed. Good aspiration obtained from the hub of the balloon guide catheter. A control arteriogram performed through this demonstrated no evidence of spasms, dissections or of intraluminal filling defects. Over a 0.014 inch standard Synchro micro guidewire with a J configuration the combination of an 071 136 cm Zoom aspiration catheter inside of which was an 021 162 cm Trevo ProVue microcatheter was advanced to the level of the ipsilateral ophthalmic artery. Thereafter, the micro guidewire was gently manipulated without difficulty through the occluded right internal carotid artery supraclinoid segment into the right middle cerebral artery and into the M2 M3 segment of the inferior division of the right middle cerebral artery. This was followed by advancement of the microcatheter. The guidewire was removed. Good aspiration obtained from the hub of the microcatheter. A gentle control arteriogram performed through this demonstrated antegrade flow into the distal M3 M4 segment of the inferior division  of the right middle cerebral artery. This was then connected to continuous heparinized saline infusion. A 5 mm x 37 mm Embotrap retrieval device was then advanced to the distal end of the microcatheter and deployed in the usual manner. The Zoom aspiration catheter was advanced such that it was in the distal occluded right middle cerebral artery. With constant aspiration being applied at the hub of the Zoom aspiration catheter with a Penumbra aspiration device, and with a 20 mL syringe at the hub of the balloon guide, and proximal flow arrest over 2 minutes, the combination of the retrieval device, microcatheter, and the Zoom aspiration catheter were retrieved and removed. Following reversal of flow arrest, a control arteriogram performed through the balloon guide in the right internal carotid artery demonstrated complete revascularization of the occluded right internal carotid artery supraclinoid segment, the right middle cerebral artery superior and inferior anterior temporal branches. However, noted were occlusions of the M3 region of the superior division in its two perisylvian branches, and also of the inferior division in the distal M3 segment. Spasm in the right internal carotid artery was treated with 2 aliquots of 25 mcg of nitroglycerin with relief of the vasospasm. Control arteriogram performed through the balloon guide in the right internal carotid artery demonstrated continued patency of the superior and the inferior divisions with no change in the occlusion of the 2 anterior perisylvian branches of the superior division, and also of the inferior division of the M3 region which over a period of time demonstrated improved revascularization into the M4 regions. A TICI 2b revascularization was achieved. Given the gradual revascularization of the inferior division, no further endovascular treatment was attempted. The balloon guide was removed. An 8 French Angio-Seal closure device applied successfully at the  right groin puncture site. Distal pulses continued to  be Dopplerable in both feet unchanged. An intermediate CT of the brain demonstrated contrast stain +/-petechial hemorrhages in the posterior putamen on the right extending to the caudate head. No mass effect or midline shift was noted. The patient was then transferred to neuro ICU intubated for post revascularization care. 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 Persistent distal occlusion of the superior division anterior perisylvian branches, with gradual improvement revascularization of the dominant inferior division in the M3 M4 regions. PLAN: Follow-up as per referring neurologist. Electronically Signed   By: Luanne Bras M.D.   On: 07/05/2020 14:22   CT HEAD CODE STROKE WO CONTRAST  Result Date: 07/04/2020 CLINICAL DATA:  Left-sided weakness and slurred speech EXAM: CT HEAD WITHOUT CONTRAST CT ANGIOGRAPHY HEAD AND NECK CT PERFUSION BRAIN TECHNIQUE: Multi detector CT imaging of the head was performed using the standard protocol without intravenous contrast. Multidetector CT imaging of the head and neck was performed using the standard protocol during bolus administration of intravenous contrast. Multiplanar CT image reconstructions and MIPs were obtained to evaluate the vascular anatomy. Carotid stenosis measurements (when applicable) are obtained utilizing NASCET criteria, using the distal internal carotid diameter as the denominator. Multiphase CT imaging of the brain was performed following IV bolus contrast injection. Subsequent parametric perfusion maps were calculated using RAPID software. CONTRAST:  174m OMNIPAQUE IOHEXOL 350 MG/ML SOLN COMPARISON:  None. FINDINGS: CT HEAD FINDINGS Brain: There is no acute intracranial hemorrhage or mass effect. Loss of gray-white  differentiation involving a portion of the right lentiform nucleus. Ventricles and sulci are normal in size and configuration. No extra-axial collection. Vascular: No hyperdense vessel. Skull: Unremarkable. Sinuses/Orbits: Lobular maxillary sinus mucosal thickening. Orbits are unremarkable. Other: Minor patchy left mastoid tip opacification. [facility] - Ganglionic level infarction (caudate, lentiform nuclei, internal capsule, insula, M1-M3 cortex): 6 - Supraganglionic infarction (M4-M6 cortex): 3 Total score (0-10 with 10 being normal): 9 CTA NECK FINDINGS Aortic arch: Normal caliber aorta.  Great vessel origins are patent. Right carotid system: Common carotid is patent. There is thrombus at the bifurcation extending into the external carotid. Less than 50% stenosis at the ICA origin. The cervical ICA is relatively smaller in caliber. Left carotid system: Patent.  No stenosis. Vertebral arteries: Patent. Right vertebral artery slightly dominant. No stenosis. Skeleton: Unremarkable. Other neck: Thyroid not well evaluated due to streak artifact. Likely reactive cervical lymph nodes. Upper chest: Included lungs are clear. Endotracheal tube is present. Review of the MIP images confirms the above findings CTA HEAD FINDINGS Anterior circulation: Proximal intracranial internal carotid artery is patent. There is loss of enhancement at the level of the supraclinoid portion. Faint enhancement is seen along the right M1 MCA. There is reconstitution of M2 branches. Patent right A1 ACA. Anterior communicating artery is present. Left anterior and middle cerebral arteries are patent. Posterior circulation: Intracranial vertebral arteries, basilar artery, and posterior cerebral arteries are patent. Venous sinuses: As permitted by contrast timing, patent. Review of the MIP images confirms the above findings CT Brain Perfusion Findings: CBF (<30%) Volume: 541mPerfusion (Tmax>6.0s) volume: 11437mMismatch Volume: 71m32mfarction Location: Right MCA territory IMPRESSION: No acute intracranial hemorrhage. 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 Reconstitution of right MCA at the bifurcation. Right A1 ACA is patent. Anterior communicating artery is present. No posterior communicating artery is identified. Perfusion imaging demonstrates core infarction of 51 mL. This is greater than anticipated by noncontrast CT. Penumbra is 63 mL and therefore may be greater than calculated. Initial results were called by telephone at the time of interpretation on 07/04/2020 at 1:58 pm to provider Dr. Curly Shores, who verbally acknowledged these results. Electronically Signed   By: Macy Mis M.D.   On: 07/04/2020 14:10   VAS [place] LOWER EXTREMITY VENOUS (DVT)  Result Date: 07/15/2020  Lower Venous DVT Study Indications: Tachycardia, tachypnea. Stroke.  Comparison Study: Prior negative BLEV done 07/05/20 Performing Technologist: Sharion Dove RVS  Examination Guidelines: A complete evaluation includes B-mode imaging, spectral Doppler, color Doppler, and power Doppler as needed of all accessible portions of each vessel. Bilateral testing is considered an integral part of a complete examination. Limited examinations for reoccurring indications may be performed as noted. The reflux portion of the exam is performed with the patient in reverse Trendelenburg.  +---------+---------------+---------+-----------+----------+--------------+ RIGHT    CompressibilityPhasicitySpontaneityPropertiesThrombus Aging +---------+---------------+---------+-----------+----------+--------------+ CFV      Full           Yes      Yes                                  +---------+---------------+---------+-----------+----------+--------------+ SFJ      Full                                                        +---------+---------------+---------+-----------+----------+--------------+ FV Prox  Full                                                        +---------+---------------+---------+-----------+----------+--------------+ FV Mid   Full                                                        +---------+---------------+---------+-----------+----------+--------------+ FV DistalFull                                                        +---------+---------------+---------+-----------+----------+--------------+ PFV      Full                                                        +---------+---------------+---------+-----------+----------+--------------+ POP      Full           Yes      Yes                                 +---------+---------------+---------+-----------+----------+--------------+  PTV      Full                                                        +---------+---------------+---------+-----------+----------+--------------+ PERO     Full                                                        +---------+---------------+---------+-----------+----------+--------------+   +---------+---------------+---------+-----------+----------+--------------+ LEFT     CompressibilityPhasicitySpontaneityPropertiesThrombus Aging +---------+---------------+---------+-----------+----------+--------------+ CFV      Full           Yes      Yes                                 +---------+---------------+---------+-----------+----------+--------------+ SFJ      Full                                                        +---------+---------------+---------+-----------+----------+--------------+ FV Prox  Full                                                         +---------+---------------+---------+-----------+----------+--------------+ FV Mid   Full                                                        +---------+---------------+---------+-----------+----------+--------------+ FV DistalFull                                                        +---------+---------------+---------+-----------+----------+--------------+ PFV      Full                                                        +---------+---------------+---------+-----------+----------+--------------+ POP      Full           Yes      Yes                                 +---------+---------------+---------+-----------+----------+--------------+ PTV      Full                                                        +---------+---------------+---------+-----------+----------+--------------+  PERO     Full                                                        +---------+---------------+---------+-----------+----------+--------------+     Summary: BILATERAL: - No evidence of deep vein thrombosis seen in the lower extremities, bilaterally. -   *See table(s) above for measurements and observations. Electronically signed by Monica Martinez MD on 07/15/2020 at 4:20:07 PM.    Final    VAS [place] LOWER EXTREMITY VENOUS (DVT)  Result Date: 07/05/2020  Lower Venous DVT Study Indications: Embolic stroke.  Comparison Study: No prior Performing Technologist: Oda Cogan RDMS, RVT  Examination Guidelines: A complete evaluation includes B-mode imaging, spectral Doppler, color Doppler, and power Doppler as needed of all accessible portions of each vessel. Bilateral testing is considered an integral part of a complete examination. Limited examinations for reoccurring indications may be performed as noted. The reflux portion of the exam is performed with the patient in reverse Trendelenburg.  +---------+---------------+---------+-----------+----------+--------------+ RIGHT     CompressibilityPhasicitySpontaneityPropertiesThrombus Aging +---------+---------------+---------+-----------+----------+--------------+ CFV      Full           Yes      Yes                                 +---------+---------------+---------+-----------+----------+--------------+ SFJ      Full                                                        +---------+---------------+---------+-----------+----------+--------------+ FV Prox  Full                                                        +---------+---------------+---------+-----------+----------+--------------+ FV Mid   Full                                                        +---------+---------------+---------+-----------+----------+--------------+ FV DistalFull                                                        +---------+---------------+---------+-----------+----------+--------------+ PFV      Full                                                        +---------+---------------+---------+-----------+----------+--------------+ POP      Full           Yes      Yes                                 +---------+---------------+---------+-----------+----------+--------------+  PTV      Full                                                        +---------+---------------+---------+-----------+----------+--------------+ PERO     Full                                                        +---------+---------------+---------+-----------+----------+--------------+   +---------+---------------+---------+-----------+----------+--------------+ LEFT     CompressibilityPhasicitySpontaneityPropertiesThrombus Aging +---------+---------------+---------+-----------+----------+--------------+ CFV      Full           Yes      Yes                                 +---------+---------------+---------+-----------+----------+--------------+ SFJ      Full                                                         +---------+---------------+---------+-----------+----------+--------------+ FV Prox  Full                                                        +---------+---------------+---------+-----------+----------+--------------+ FV Mid   Full                                                        +---------+---------------+---------+-----------+----------+--------------+ FV DistalFull                                                        +---------+---------------+---------+-----------+----------+--------------+ PFV      Full                                                        +---------+---------------+---------+-----------+----------+--------------+ POP      Full           Yes      Yes                                 +---------+---------------+---------+-----------+----------+--------------+ PTV      Full                                                        +---------+---------------+---------+-----------+----------+--------------+   PERO     Full                                                        +---------+---------------+---------+-----------+----------+--------------+     Summary: BILATERAL: - No evidence of deep vein thrombosis seen in the lower extremities, bilaterally. -No evidence of popliteal cyst, bilaterally.   *See table(s) above for measurements and observations. Electronically signed by Monica Martinez MD on 07/05/2020 at 6:21:17 PM.    Final    [place] EKG SITE RITE  Result Date: 07/06/2020 If Site Rite image not attached, placement could not be confirmed due to current cardiac rhythm.  IR ANGIO INTRA EXTRACRAN SEL COM CAROTID INNOMINATE UNI L MOD SED  INDICATION: New onset of left-sided weakness, right gaze deviation and slurred speech.  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  EXAM: 1. EMERGENT LARGE  VESSEL OCCLUSION THROMBOLYSIS (anterior CIRCULATION)  COMPARISON:  CT angiogram of the head and neck of July 04, 2020.  MEDICATIONS: Ancef 2 g IV antibiotic was administered within 1 hour of the procedure.  ANESTHESIA/SEDATION: General anesthesia  CONTRAST:  Isovue 300 105 mL.  FLUOROSCOPY TIME:  Fluoroscopy Time: 24 minutes 0 seconds (1208 mGy).  COMPLICATIONS: None immediate.  TECHNIQUE: Two physician emergency consent was obtained in view of the non availability of patient's family or next of kin physically or via phone.  The patient was then put under general anesthesia by the Department of Anesthesiology at [facility].  The right groin groin was prepped and draped in the usual sterile fashion. Thereafter using modified Seldinger technique, transfemoral access into the right common femoral artery was obtained without difficulty. Over a 0.035 inch guidewire an 8 French 25 cm Pinnacle sheath was inserted. Through this, and also over a 0.035 inch guidewire a 5 [place] JB 1 catheter was advanced to the aortic arch region and selectively positioned in the left common carotid artery, the right vertebral artery and the right common carotid artery.  FINDINGS: The left common carotid arteriogram demonstrates the left external carotid artery and its major branches to be widely patent.  The left internal carotid artery at the bulb to the cranial skull base is widely patent.  The petrous, the cavernous and the supraclinoid segments are widely patent.  There is an approximately 3 mm outpouching in the left posterior communicating artery region which may represent an infundibulum versus an aneurysm.  The left middle cerebral artery and the left anterior cerebral artery opacify into the capillary and venous phases. Prompt cross-filling via the anterior communicating artery of the right anterior cerebral A2 segment and distally, and the right anterior cerebral A1 segment and the distal A2 region is noted.   The right common carotid arteriogram demonstrates the right external carotid artery to be occluded. There is a meniscal filling defect right at the origin of the right external carotid artery. Minimal extrusion was seen into the right internal carotid artery at the bulb.  More distally the right internal carotid artery is seen to opacify to the cranial skull base. Patency is seen of the petrous and the caval and proximal cavernous segments. 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  The  right vertebral artery origin is widely patent.  The vessel is seen to opacify to the cranial skull base. More distally patency is seen of the right vertebrobasilar junction and the right posterior-inferior cerebellar artery.  The basilar artery, the posterior cerebral arteries, the superior cerebellar arteries and the anterior-inferior cerebellar arteries demonstrate wide patency into the capillary and venous phases. 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  PROCEDURE: The diagnostic JB 1 catheter in right common carotid artery was then exchanged over a 0.035 inch 300 cm Rosen exchange guidewire for an 087 balloon guide catheter which was positioned in the mid cervical right ICA.  The guidewire was removed. Good aspiration obtained from the hub of the balloon guide catheter. A control arteriogram performed through this demonstrated no evidence of spasms, dissections or of intraluminal filling defects. Over a 0.014 inch standard Synchro micro guidewire with a J configuration the combination of an 071 136 cm Zoom aspiration catheter inside of which was an 021 162 cm Trevo ProVue microcatheter was advanced to the level of the ipsilateral ophthalmic artery.  Thereafter, the micro guidewire was gently manipulated without difficulty through the occluded right internal carotid artery  supraclinoid segment into the right middle cerebral artery and into the M2 M3 segment of the inferior division of the right middle cerebral artery. This was followed by advancement of the microcatheter. The guidewire was removed. Good aspiration obtained from the hub of the microcatheter. A gentle control arteriogram performed through this demonstrated antegrade flow into the distal M3 M4 segment of the inferior division of the right middle cerebral artery. This was then connected to continuous heparinized saline infusion.  A 5 mm x 37 mm Embotrap retrieval device was then advanced to the distal end of the microcatheter and deployed in the usual manner. The Zoom aspiration catheter was advanced such that it was in the distal occluded right middle cerebral artery. With constant aspiration being applied at the hub of the Zoom aspiration catheter with a Penumbra aspiration device, and with a 20 mL syringe at the hub of the balloon guide, and proximal flow arrest over 2 minutes, the combination of the retrieval device, microcatheter, and the Zoom aspiration catheter were retrieved and removed. Following reversal of flow arrest, a control arteriogram performed through the balloon guide in the right internal carotid artery demonstrated complete revascularization of the occluded right internal carotid artery supraclinoid segment, the right middle cerebral artery superior and inferior anterior temporal branches. However, noted were occlusions of the M3 region of the superior division in its two perisylvian branches, and also of the inferior division in the distal M3 segment.  Spasm in the right internal carotid artery was treated with 2 aliquots of 25 mcg of nitroglycerin with relief of the vasospasm.  Control arteriogram performed through the balloon guide in the right internal carotid artery demonstrated continued patency of the superior and the inferior divisions with no change in the occlusion of the 2 anterior  perisylvian branches of the superior division, and also of the inferior division of the M3 region which over a period of time demonstrated improved revascularization into the M4 regions.  A TICI 2b revascularization was achieved.  Given the gradual revascularization of the inferior division, no further endovascular treatment was attempted.  The balloon guide was removed. An 8 French Angio-Seal closure device applied successfully at the right groin puncture site. Distal pulses continued to be Dopplerable in both feet unchanged.  An  intermediate CT of the brain demonstrated contrast stain +/-petechial hemorrhages in the posterior putamen on the right extending to the caudate head.  No mass effect or midline shift was noted.  The patient was then transferred to neuro ICU intubated for post revascularization care.  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  Persistent distal occlusion of the superior division anterior perisylvian branches, with gradual improvement revascularization of the dominant inferior division in the M3 M4 regions.  PLAN: Follow-up as per referring neurologist.   Electronically Signed   By: Luanne Bras M.D.   On: 07/05/2020 14:22  IR ANGIO VERTEBRAL SEL VERTEBRAL UNI R MOD SED  INDICATION: New onset of left-sided weakness, right gaze deviation and slurred speech.  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  EXAM: 1. EMERGENT LARGE VESSEL OCCLUSION THROMBOLYSIS (anterior CIRCULATION)  COMPARISON:  CT angiogram of the head and neck of July 04, 2020.  MEDICATIONS: Ancef 2 g IV antibiotic was administered within 1 hour of the procedure.  ANESTHESIA/SEDATION: General anesthesia   CONTRAST:  Isovue 300 105 mL.  FLUOROSCOPY TIME:  Fluoroscopy Time: 24 minutes 0 seconds (1208 mGy).  COMPLICATIONS: None immediate.  TECHNIQUE: Two physician emergency consent was obtained in view of the non availability of patient's family or next of kin physically or via phone.  The patient was then put under general anesthesia by the Department of Anesthesiology at [facility].  The right groin groin was prepped and draped in the usual sterile fashion. Thereafter using modified Seldinger technique, transfemoral access into the right common femoral artery was obtained without difficulty. Over a 0.035 inch guidewire an 8 French 25 cm Pinnacle sheath was inserted. Through this, and also over a 0.035 inch guidewire a 5 [place] JB 1 catheter was advanced to the aortic arch region and selectively positioned in the left common carotid artery, the right vertebral artery and the right common carotid artery.  FINDINGS: The left common carotid arteriogram demonstrates the left external carotid artery and its major branches to be widely patent.  The left internal carotid artery at the bulb to the cranial skull base is widely patent.  The petrous, the cavernous and the supraclinoid segments are widely patent.  There is an approximately 3 mm outpouching in the left posterior communicating artery region which may represent an infundibulum versus an aneurysm.  The left middle cerebral artery and the left anterior cerebral artery opacify into the capillary and venous phases. Prompt cross-filling via the anterior communicating artery of the right anterior cerebral A2 segment and distally, and the right anterior cerebral A1 segment and the distal A2 region is noted.  The right common carotid arteriogram demonstrates the right external carotid artery to be occluded. There is a meniscal filling defect right at the origin of the right external carotid artery. Minimal extrusion was seen into the right internal  carotid artery at the bulb.  More distally the right internal carotid artery is seen to opacify to the cranial skull base. Patency is seen of the petrous and the caval and proximal cavernous segments. 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  The right vertebral artery origin is widely patent.  The vessel is seen to opacify to the cranial skull base. More distally  patency is seen of the right vertebrobasilar junction and the right posterior-inferior cerebellar artery.  The basilar artery, the posterior cerebral arteries, the superior cerebellar arteries and the anterior-inferior cerebellar arteries demonstrate wide patency into the capillary and venous phases. 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  PROCEDURE: The diagnostic JB 1 catheter in right common carotid artery was then exchanged over a 0.035 inch 300 cm Rosen exchange guidewire for an 087 balloon guide catheter which was positioned in the mid cervical right ICA.  The guidewire was removed. Good aspiration obtained from the hub of the balloon guide catheter. A control arteriogram performed through this demonstrated no evidence of spasms, dissections or of intraluminal filling defects. Over a 0.014 inch standard Synchro micro guidewire with a J configuration the combination of an 071 136 cm Zoom aspiration catheter inside of which was an 021 162 cm Trevo ProVue microcatheter was advanced to the level of the ipsilateral ophthalmic artery.  Thereafter, the micro guidewire was gently manipulated without difficulty through the occluded right internal carotid artery supraclinoid segment into the right middle cerebral artery and into the M2 M3 segment of the inferior division of the right middle cerebral artery. This was followed by advancement of the microcatheter. The guidewire was removed. Good aspiration  obtained from the hub of the microcatheter. A gentle control arteriogram performed through this demonstrated antegrade flow into the distal M3 M4 segment of the inferior division of the right middle cerebral artery. This was then connected to continuous heparinized saline infusion.  A 5 mm x 37 mm Embotrap retrieval device was then advanced to the distal end of the microcatheter and deployed in the usual manner. The Zoom aspiration catheter was advanced such that it was in the distal occluded right middle cerebral artery. With constant aspiration being applied at the hub of the Zoom aspiration catheter with a Penumbra aspiration device, and with a 20 mL syringe at the hub of the balloon guide, and proximal flow arrest over 2 minutes, the combination of the retrieval device, microcatheter, and the Zoom aspiration catheter were retrieved and removed. Following reversal of flow arrest, a control arteriogram performed through the balloon guide in the right internal carotid artery demonstrated complete revascularization of the occluded right internal carotid artery supraclinoid segment, the right middle cerebral artery superior and inferior anterior temporal branches. However, noted were occlusions of the M3 region of the superior division in its two perisylvian branches, and also of the inferior division in the distal M3 segment.  Spasm in the right internal carotid artery was treated with 2 aliquots of 25 mcg of nitroglycerin with relief of the vasospasm.  Control arteriogram performed through the balloon guide in the right internal carotid artery demonstrated continued patency of the superior and the inferior divisions with no change in the occlusion of the 2 anterior perisylvian branches of the superior division, and also of the inferior division of the M3 region which over a period of time demonstrated improved revascularization into the M4 regions.  A TICI 2b revascularization was achieved.  Given the gradual  revascularization of the inferior division, no further endovascular treatment was attempted.  The balloon guide was removed. An 8 French Angio-Seal closure device applied successfully at the right groin puncture site. Distal pulses continued to be Dopplerable in both feet unchanged.  An intermediate CT of the brain demonstrated contrast stain +/-petechial hemorrhages in the posterior putamen on the right extending to the caudate  head.  No mass effect or midline shift was noted.  The patient was then transferred to neuro ICU intubated for post revascularization care.  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  Persistent distal occlusion of the superior division anterior perisylvian branches, with gradual improvement revascularization of the dominant inferior division in the M3 M4 regions.  PLAN: Follow-up as per referring neurologist.   Electronically Signed   By: Luanne Bras M.D.   On: 07/05/2020 14:22    ASSESSMENT AND PLAN:  1) thrombocytosis -Patient had normal platelet counts of 235k on admission on 07/04/2020 with some leukocytosis of 17.8k. -Platelets have progressively gone up to the 900k in the context of surgery, thrombosis , cocaine use and iron deficiency. -Chances of this being an MPN are less likely.  She has multiple other reactive considerations as noted above. 2) leukocytosis 3) anemia secondary to iron deficiency 4) right MCA embolic infarct 5) poorly controlled diabetes mellitus 6) hyperlipidemia 7) cocaine abuse 8) tobacco abuse  PLAN: -Will get Jak2, MPL, CalR, BCR ABL clonal studies to rule out MPN. -Would recommend aggressive iron repletion -consider IV Feraheme weekly x2 doses. -Sed rate and CRP to evaluate inflammatory elements. -Avoid cocaine use which could have been a risk factor for  her strokes thrombosis as well as leukocytosis and thrombocytosis. -Hypercoagulable work-up -Anticoagulation per neurology   Thank you for this referral.  Mikey Bussing, DNP, AGPCNP-BC, AOCNP   ADDENDUM  .Patient was Personally and independently interviewed, examined and relevant elements of the history of present illness were reviewed in details and an assessment and plan was created. All elements of the patient's history of present illness , assessment and plan were discussed in details with Mikey Bussing, DNP, AGPCNP-BC, AOCNP. The above documentation reflects our combined findings assessment and plan.  Sullivan Lone MD MS

## 2020-07-24 NOTE — Progress Notes (Signed)
Inpatient Rehabilitation Admissions Coordinator  Discussed with Dr. Morrie Sheldon. Patient will need prolonged rehab recovery and lacks the caregiver supports needed for a CIR admit. On 4/7 I spoke with her Aunt, and family is unable to provide the caregiver support that is projected. Family is requesting SNF for prolonged rehab recovery. Patient not a candidate for CIR admit . I have discussed with Dr. Riley Kill.  Ottie Glazier, RN, MSN Rehab Admissions Coordinator [phone] 07/24/2020 2:40 PM

## 2020-07-24 NOTE — Progress Notes (Signed)
IVT consulted for difficult IV placement.  Upon assessment, R arm is restricted and L arm is flacid.  Primary RN and IVT RN to follow-up w/MD.

## 2020-07-24 NOTE — Consult Note (Signed)
Consult Note  Brandi Myers May 22, 1985  [id number].    Requesting MD: Dr. Delia Heady Chief Complaint/Reason for Consult: PEG placement  HPI:  Patient is a 35 year old female who presented to [facility] with left-sided weakness and slurred speech 07/04/20. She was found to have acute infarction involving R lentiform nucleus, thrombus of the right common carotid bifurcation extending into the external carotid, occlusion of the supraclinoid right ICA probably reflecting embolization from the thrombus. She received TPA, and underwent thrombectomy with right terminal ICA, right PCA and right ACA. She also underwent decompressive craniectomy on 07/06/20, bone flap remains in her right abdomen. She was also shown to have a large right MCA infarct. We are asked to see for consideration of PEG placement. SLP has seen and evaluated patient and recommended D1 diet, however there is concern that she will not be able to take in enough PO to meet nutritional needs.   PMH significant for uncontrolled HTN and T2DM. There is a question of substance abuse and UDS was positive for cocaine and THC. Past abdominal surgery includes cholecystectomy and cesarean section.   ROS: Review of Systems  Unable to perform ROS: Mental acuity  Gastrointestinal: Negative for abdominal pain, constipation, diarrhea, nausea and vomiting.    Family History  Problem Relation Age of Onset  . Diabetes Maternal Grandmother     Past Medical History:  Diagnosis Date  . Diabetes mellitus without complication (HCC)   . Hypertension     Past Surgical History:  Procedure Laterality Date  . BUBBLE STUDY  07/19/2020   Procedure: BUBBLE STUDY;  Surgeon: Little Ishikawa, MD;  Location: [facility] ENDOSCOPY;  Service: Cardiovascular;;  . CESAREAN SECTION    . CHOLECYSTECTOMY    . CRANIOTOMY Right 07/06/2020   Procedure: RIGHT CRANIECTOMY WITH PLACEMENT OF BONE FLAP IN ABDOMEN;  Surgeon: Maeola Harman, MD;  Location: [facility] OR;   Service: Neurosurgery;  Laterality: Right;  . IR ANGIO INTRA EXTRACRAN SEL COM CAROTID INNOMINATE UNI L MOD SED  07/04/2020  . IR ANGIO VERTEBRAL SEL VERTEBRAL UNI R MOD SED  07/04/2020  . IR CT HEAD LTD  07/04/2020  . IR CT HEAD LTD  07/04/2020  . IR PERCUTANEOUS ART THROMBECTOMY/INFUSION INTRACRANIAL INC DIAG ANGIO  07/04/2020  . RADIOLOGY WITH ANESTHESIA N/A 07/04/2020   Procedure: IR WITH ANESTHESIA;  Surgeon: Radiologist, Medication, MD;  Location: [facility] OR;  Service: Radiology;  Laterality: N/A;  . TEE WITHOUT CARDIOVERSION N/A 07/19/2020   Procedure: TRANSESOPHAGEAL ECHOCARDIOGRAM (TEE);  Surgeon: Little Ishikawa, MD;  Location: [facility] ENDOSCOPY;  Service: Cardiovascular;  Laterality: N/A;  . THROMBECTOMY BRACHIAL ARTERY Right 07/05/2020   Procedure: RIGHT UPPER EXTREMITY THROMBECTOMY;  Surgeon: Cephus Shelling, MD;  Location: [facility] OR;  Service: Vascular;  Laterality: Right;    Social History:  reports that she has been smoking cigarettes. She has been smoking about 0.10 packs per day. She has never used smokeless tobacco. She reports current alcohol use. She reports that she does not use drugs.  Allergies:  Allergies  Allergen Reactions  . Norco [Hydrocodone-Acetaminophen] Itching, Nausea And Vomiting and Swelling    No medications prior to admission.    Blood pressure 131/78, pulse 96, temperature 98.1 F (36.7 C), temperature source Axillary, resp. rate 19, height 5\' 5"  (1.651 m), weight 69.7 kg, SpO2 96 %. Physical Exam:  General: WD, WN female who is laying in bed in NAD HEENT: crani incision c/d/i. Rightward gaze, left facial droop Heart: regular, rate,  and rhythm.  Normal s1,s2. No obvious murmurs, gallops, or rubs noted.  Palpable radial and pedal pulses bilaterally Lungs: CTAB, no wheezes, rhonchi, or rales noted.  Respiratory effort nonlabored Abd: soft, NT, ND, +BS, bone fragment in RUQ MS: all 4 extremities are symmetrical with no cyanosis, clubbing, or edema. Skin:  warm and dry with no masses, lesions, or rashes Neuro: followed commands, disinhibited  Psych: A&Ox3 with an blunted affect.   Results for orders placed or performed during the hospital encounter of 07/04/20 (from the past 48 hour(s))  Glucose, capillary     Status: Abnormal   Collection Time: 07/22/20  4:24 PM  Result Value Ref Range   Glucose-Capillary 114 (H) 70 - 99 mg/dL    Comment: Glucose reference range applies only to samples taken after fasting for at least 8 hours.  Glucose, capillary     Status: Abnormal   Collection Time: 07/22/20  8:07 PM  Result Value Ref Range   Glucose-Capillary 139 (H) 70 - 99 mg/dL    Comment: Glucose reference range applies only to samples taken after fasting for at least 8 hours.  Glucose, capillary     Status: Abnormal   Collection Time: 07/22/20 11:34 PM  Result Value Ref Range   Glucose-Capillary 140 (H) 70 - 99 mg/dL    Comment: Glucose reference range applies only to samples taken after fasting for at least 8 hours.  Glucose, capillary     Status: None   Collection Time: 07/23/20  3:36 AM  Result Value Ref Range   Glucose-Capillary 90 70 - 99 mg/dL    Comment: Glucose reference range applies only to samples taken after fasting for at least 8 hours.  CBC     Status: Abnormal   Collection Time: 07/23/20  4:02 AM  Result Value Ref Range   WBC 12.9 (H) 4.0 - 10.5 K/uL   RBC 4.56 3.87 - 5.11 MIL/uL   Hemoglobin 9.9 (L) 12.0 - 15.0 g/dL   HCT 35.5 (L) 97.4 - 16.3 %   MCV 77.6 (L) 80.0 - 100.0 fL   MCH 21.7 (L) 26.0 - 34.0 pg   MCHC 28.0 (L) 30.0 - 36.0 g/dL   RDW 84.5 (H) 36.4 - 68.0 %   Platelets 915 (HH) 150 - 400 K/uL    Comment: REPEATED TO VERIFY PLATELET COUNT CONFIRMED BY SMEAR THIS CRITICAL RESULT HAS VERIFIED AND BEEN CALLED TO S.DOVE,RN BY MELISSA BROGDON ON 04 11 2022 AT 0454, AND HAS BEEN READ BACK.     nRBC 0.0 0.0 - 0.2 %    Comment: Performed at [facility], [address], [place], [place] [address]  Basic  metabolic panel     Status: Abnormal   Collection Time: 07/23/20  4:02 AM  Result Value Ref Range   Sodium 137 135 - 145 mmol/L   Potassium 4.0 3.5 - 5.1 mmol/L   Chloride 102 98 - 111 mmol/L   CO2 28 22 - 32 mmol/L   Glucose, Bld 108 (H) 70 - 99 mg/dL    Comment: Glucose reference range applies only to samples taken after fasting for at least 8 hours.   BUN 6 6 - 20 mg/dL   Creatinine, Ser 4.82 0.44 - 1.00 mg/dL   Calcium 9.4 8.9 - 50.0 mg/dL   GFR, Estimated >37 >04 mL/min    Comment: (NOTE) Calculated using the CKD-EPI Creatinine Equation (2021)    Anion gap 7 5 - 15    Comment: Performed at [facility], [address] [place], [place] [address]  Heparin level (unfractionated)     Status: None   Collection Time: 07/23/20  4:02 AM  Result Value Ref Range   Heparin Unfractionated 0.44 0.30 - 0.70 IU/mL    Comment: (NOTE) If heparin results are below expected values, and patient dosage has  been confirmed, suggest follow up testing of antithrombin III levels. Performed at [facility], [address] [place], [place] [address]   Pathologist smear review     Status: None   Collection Time: 07/23/20  4:02 AM  Result Value Ref Range   Path Review      Microcytic anemia with anisocytosis and polychromasia. Thrombocytosis. Mild leukocytosis. Rare blast.    Comment: Reviewed by Sylvie FarrierStacey O'Neill, MD, PhD 07/23/2020 Performed at [facility], [address] [place], [place] [address]   Glucose, capillary     Status: Abnormal   Collection Time: 07/23/20  8:19 AM  Result Value Ref Range   Glucose-Capillary 149 (H) 70 - 99 mg/dL    Comment: Glucose reference range applies only to samples taken after fasting for at least 8 hours.  Glucose, capillary     Status: Abnormal   Collection Time: 07/23/20 11:42 AM  Result Value Ref Range   Glucose-Capillary 210 (H) 70 - 99 mg/dL    Comment: Glucose reference range applies only to samples taken after fasting for at least  8 hours.  Heparin level (unfractionated)     Status: Abnormal   Collection Time: 07/23/20  2:31 PM  Result Value Ref Range   Heparin Unfractionated 0.19 (L) 0.30 - 0.70 IU/mL    Comment: (NOTE) If heparin results are below expected values, and patient dosage has  been confirmed, suggest follow up testing of antithrombin III levels. Performed at [facility], [address] [place], [place] [address]   Glucose, capillary     Status: Abnormal   Collection Time: 07/23/20  4:10 PM  Result Value Ref Range   Glucose-Capillary 121 (H) 70 - 99 mg/dL    Comment: Glucose reference range applies only to samples taken after fasting for at least 8 hours.  Glucose, capillary     Status: Abnormal   Collection Time: 07/23/20  8:10 PM  Result Value Ref Range   Glucose-Capillary 164 (H) 70 - 99 mg/dL    Comment: Glucose reference range applies only to samples taken after fasting for at least 8 hours.  Heparin level (unfractionated)     Status: None   Collection Time: 07/23/20 10:08 PM  Result Value Ref Range   Heparin Unfractionated 0.36 0.30 - 0.70 IU/mL    Comment: (NOTE) If heparin results are below expected values, and patient dosage has  been confirmed, suggest follow up testing of antithrombin III levels. Performed at [facility], [address] [place], [place] [address]   Glucose, capillary     Status: Abnormal   Collection Time: 07/24/20 12:16 AM  Result Value Ref Range   Glucose-Capillary 131 (H) 70 - 99 mg/dL    Comment: Glucose reference range applies only to samples taken after fasting for at least 8 hours.  Glucose, capillary     Status: Abnormal   Collection Time: 07/24/20  3:46 AM  Result Value Ref Range   Glucose-Capillary 107 (H) 70 - 99 mg/dL    Comment: Glucose reference range applies only to samples taken after fasting for at least 8 hours.  CBC  Status: Abnormal   Collection Time: 07/24/20  4:51 AM  Result Value Ref Range   WBC 12.5 (H) 4.0 - 10.5  K/uL   RBC 4.53 3.87 - 5.11 MIL/uL   Hemoglobin 10.0 (L) 12.0 - 15.0 g/dL   HCT 54.0 (L) 98.1 - 19.1 %   MCV 79.2 (L) 80.0 - 100.0 fL   MCH 22.1 (L) 26.0 - 34.0 pg   MCHC 27.9 (L) 30.0 - 36.0 g/dL   RDW 47.8 (H) 29.5 - 62.1 %   Platelets 925 (HH) 150 - 400 K/uL    Comment: CRITICAL VALUE NOTED.  VALUE IS CONSISTENT WITH PREVIOUSLY REPORTED AND CALLED VALUE. REPEATED TO VERIFY    nRBC 0.0 0.0 - 0.2 %    Comment: Performed at [facility], [address], [place], [place] [address]  Basic metabolic panel     Status: Abnormal   Collection Time: 07/24/20  4:51 AM  Result Value Ref Range   Sodium 138 135 - 145 mmol/L   Potassium 4.1 3.5 - 5.1 mmol/L   Chloride 100 98 - 111 mmol/L   CO2 28 22 - 32 mmol/L   Glucose, Bld 116 (H) 70 - 99 mg/dL    Comment: Glucose reference range applies only to samples taken after fasting for at least 8 hours.   BUN 8 6 - 20 mg/dL   Creatinine, Ser 7.84 0.44 - 1.00 mg/dL   Calcium 9.7 8.9 - 69.6 mg/dL   GFR, Estimated >29 >52 mL/min    Comment: (NOTE) Calculated using the CKD-EPI Creatinine Equation (2021)    Anion gap 10 5 - 15    Comment: Performed at [facility], [address], [place], [place] [address]  Heparin level (unfractionated)     Status: Abnormal   Collection Time: 07/24/20  4:51 AM  Result Value Ref Range   Heparin Unfractionated 0.22 (L) 0.30 - 0.70 IU/mL    Comment: (NOTE) If heparin results are below expected values, and patient dosage has  been confirmed, suggest follow up testing of antithrombin III levels. Performed at [facility], [address], [place], [place] [address]   Glucose, capillary     Status: Abnormal   Collection Time: 07/24/20  8:43 AM  Result Value Ref Range   Glucose-Capillary 193 (H) 70 - 99 mg/dL    Comment: Glucose reference range applies only to samples taken after fasting for at least 8 hours.   Comment 1 Notify RN    Comment 2 Document in Chart   Sedimentation rate     Status:  Abnormal   Collection Time: 07/24/20  9:27 AM  Result Value Ref Range   Sed Rate 92 (H) 0 - 22 mm/hr    Comment: Performed at [facility], [address] [place], [place] [address]  C-reactive protein     Status: Abnormal   Collection Time: 07/24/20  9:27 AM  Result Value Ref Range   CRP 2.9 (H) <1.0 mg/dL    Comment: Performed at [facility], [address] [place], [place] [address]  Glucose, capillary     Status: Abnormal   Collection Time: 07/24/20 12:13 PM  Result Value Ref Range   Glucose-Capillary 142 (H) 70 - 99 mg/dL    Comment: Glucose reference range applies only to samples taken after fasting for at least 8 hours.   No results found.    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, vascular following  Iron deficiency anemia  Thrombocytosis   Right MCA embolic infarct secondary to Right ICA occlusion s/prevascularization occluded terminal ICA and Rt MCA and RT ACA A1with Hemorrhagic Conversionand malignant cerebral edema and brain herniationS/Pright hemicraniectomy, source unclear  Dysphagia/Poor PO intake  - consulted for possible PEG placement due to concern for inability to take in enough PO intake to meet nutritional needs - will need to discuss with family and obtain consent - will call tomorrow  - tentatively plan for Thursday AM at 10:00  - will need TF and heparin gtt held at [facility] tomorrow    Juliet Rude, [facility] 07/24/2020, 3:04 PM Please see Amion for pager number during day hours 7:00am-4:30pm

## 2020-07-24 NOTE — Progress Notes (Signed)
Hematology Short note   Hematology consult received for this patient. Chart reviewed. Patient with significant thrombocytosis and some leukocytosis.  Patient had normal platelet counts of 235k on admission on 07/04/2020 with some leukocytosis of 17.8k. Platelets have progressively gone up to the 900k in the context of surgery, thrombosis , cocaine use and iron deficiency. Chances of this being an MPN are less likely.  She has multiple other reactive considerations as noted above. Plan -Will get Jak2, MPL, CalR, BCR ABL clonal studies to rule out MPN. -Would recommend aggressive iron repletion -consider IV Feraheme weekly x2 doses. -Sed rate and CRP to evaluate inflammatory elements. -Avoid cocaine use which could have been a risk factor for her strokes thrombosis as well as leukocytosis and thrombocytosis. -Hypercoagulable work-up -Anticoagulation per neurology. -Full consult to follow.  Wyvonnia Lora MD

## 2020-07-25 DIAGNOSIS — I6601 Occlusion and stenosis of right middle cerebral artery: Secondary | ICD-10-CM | POA: Diagnosis not present

## 2020-07-25 LAB — HEPARIN LEVEL (UNFRACTIONATED)
Heparin Unfractionated: 0.85 IU/mL — ABNORMAL HIGH (ref 0.30–0.70)
Heparin Unfractionated: 0.63 IU/mL (ref 0.30–0.70)

## 2020-07-25 LAB — GLUCOSE, CAPILLARY
Glucose-Capillary: 107 mg/dL — ABNORMAL HIGH (ref 70–99)
Glucose-Capillary: 117 mg/dL — ABNORMAL HIGH (ref 70–99)
Glucose-Capillary: 146 mg/dL — ABNORMAL HIGH (ref 70–99)
Glucose-Capillary: 151 mg/dL — ABNORMAL HIGH (ref 70–99)
Glucose-Capillary: 155 mg/dL — ABNORMAL HIGH (ref 70–99)
Glucose-Capillary: 174 mg/dL — ABNORMAL HIGH (ref 70–99)

## 2020-07-25 LAB — CARDIOLIPIN ANTIBODIES, IGG, IGM, IGA
Anticardiolipin IgA: 9 APL U/mL (ref 0–11)
Anticardiolipin IgG: 15 GPL U/mL — ABNORMAL HIGH (ref 0–14)
Anticardiolipin IgM: 15 MPL U/mL — ABNORMAL HIGH (ref 0–12)

## 2020-07-25 LAB — PROTEIN S, TOTAL: Protein S Ag, Total: 114 % (ref 60–150)

## 2020-07-25 LAB — LUPUS ANTICOAGULANT PANEL
DRVVT: 34.1 s (ref 0.0–47.0)
PTT Lupus Anticoagulant: 42.6 s (ref 0.0–51.9)

## 2020-07-25 LAB — BETA-2-GLYCOPROTEIN I ABS, IGG/M/A
Beta-2 Glyco I IgG: <9 GPI IgG units (ref 0–20)
Beta-2-Glycoprotein I IgA: 31 GPI IgA units — ABNORMAL HIGH (ref 0–25)
Beta-2-Glycoprotein I IgM: 11 GPI IgM units (ref 0–32)

## 2020-07-25 LAB — CBC
HCT: 34.6 % — ABNORMAL LOW (ref 36.0–46.0)
Hemoglobin: 9.6 g/dL — ABNORMAL LOW (ref 12.0–15.0)
MCH: 21.7 pg — ABNORMAL LOW (ref 26.0–34.0)
MCHC: 27.7 g/dL — ABNORMAL LOW (ref 30.0–36.0)
MCV: 78.1 fL — ABNORMAL LOW (ref 80.0–100.0)
Platelets: 830 10*3/uL — ABNORMAL HIGH (ref 150–400)
RBC: 4.43 MIL/uL (ref 3.87–5.11)
RDW: 24.9 % — ABNORMAL HIGH (ref 11.5–15.5)
WBC: 11.1 10*3/uL — ABNORMAL HIGH (ref 4.0–10.5)
nRBC: 0 % (ref 0.0–0.2)

## 2020-07-25 LAB — PROTEIN C ACTIVITY: Protein C Activity: 117 % (ref 73–180)

## 2020-07-25 MED ORDER — MELATONIN 3 MG PO TABS
3.0000 mg | ORAL_TABLET | Freq: Every day | ORAL | Status: DC
  Administered 2020-07-25 – 2020-07-27 (×3): 3 mg via ORAL
  Filled 2020-07-25 (×3): qty 1

## 2020-07-25 NOTE — Progress Notes (Signed)
Patient ID: Brandi Myers, female   DOB: 06/07/1985, 35 y.o.   MRN: [id number] 6 Days Post-Op   Subjective: C/O cortrak irritating her nose ROS negative except as listed above. Objective: Vital signs in last 24 hours: Temp:  [97.9 F (36.6 C)-99.6 F (37.6 C)] 98.2 F (36.8 C) (04/13 0730) Pulse Rate:  [94-108] 94 (04/13 0730) Resp:  [15-19] 18 (04/13 0730) BP: (112-131)/(78-95) 117/93 (04/13 0730) SpO2:  [95 %-99 %] 95 % (04/13 0730) Last BM Date: 07/21/20  Intake/Output from previous day: 04/12 0701 - 04/13 0700 In: 2075.2 [I.V.:917.9; NG/GT:994.2; IV Piggyback:163.1] Out: 1000 [Urine:1000] Intake/Output this shift: Total I/O In: 60 [P.O.:60] Out: -   General appearance: alert and cooperative Nose: Cortrak, nare OK Resp: clear to auscultation bilaterally Cardio: regular rate and rhythm GI: soft, NT  Lab Results: CBC  Recent Labs    07/24/20 0451 07/25/20 0633  WBC 12.5* 11.1*  HGB 10.0* 9.6*  HCT 35.9* 34.6*  PLT 925* 830*   BMET Recent Labs    07/23/20 0402 07/24/20 0451  NA 137 138  K 4.0 4.1  CL 102 100  CO2 28 28  GLUCOSE 108* 116*  BUN 6 8  CREATININE 0.74 0.53  CALCIUM 9.4 9.7   PT/INR No results for input(s): LABPROT, INR in the last 72 hours. ABG No results for input(s): PHART, HCO3 in the last 72 hours.  Invalid input(s): PCO2, PO2  Studies/Results: No results found.  Anti-infectives: Anti-infectives (From admission, onward)   Start     Dose/Rate Route Frequency Ordered Stop   07/08/20 0900  cefTRIAXone (ROCEPHIN) 2 g in sodium chloride 0.9 % 100 mL IVPB        2 g 200 mL/hr over 30 Minutes Intravenous Every 24 hours 07/08/20 0801 07/14/20 1132   07/08/20 0900  vancomycin (VANCOCIN) 1,750 mg in sodium chloride 0.9 % 500 mL IVPB        1,750 mg 250 mL/hr over 120 Minutes Intravenous  Once 07/08/20 0802 07/08/20 1149   07/06/20 2000  ceFAZolin (ANCEF) IVPB 1 g/50 mL premix        1 g 100 mL/hr over 30 Minutes Intravenous Every 8  hours 07/06/20 1400 07/07/20 0508   07/06/20 0945  ceFAZolin (ANCEF) IVPB 2g/100 mL premix        2 g 200 mL/hr over 30 Minutes Intravenous To Surgery 07/06/20 0855 07/07/20 0945   07/04/20 1541  ceFAZolin (ANCEF) 2-4 GM/100ML-% IVPB       Note to Pharmacy: Kathrynn Speed   : cabinet override      07/04/20 1541 07/05/20 0344      Assessment/Plan: CVA For PEG placement tomorrow in Endo, 10am. I discussed the procedure, risks, and benefits with her aunt Vena Rua. She agrees and consent is documented. Will hold TF at MN and heparin drip at 0400 tomorrow.  LOS: 21 days    Violeta Gelinas, MD, MPH, FACS Trauma & General Surgery Use [email or web address] to contact on call provider  07/25/2020

## 2020-07-25 NOTE — Progress Notes (Addendum)
STROKE TEAM PROGRESS NOTE   INTERVAL HISTORY This AM patient is alert and is laying horizontal in her bed with her right leg hanging over the rails. Patient reports that she would like some socks because her feet are cold and she is concerned that she is menstruating and it is becoming messy. Otherwise patient has no other concerns.  Patient was seen by Trauma surgery yesterday and approved fpr PEG placement tom at 10am.  Vital signs are stable.  No neurological changes  Vitals:   07/24/20 2046 07/24/20 2356 07/25/20 0420 07/25/20 0730  BP: 116/79 (!) 130/95 (!) 112/92 (!) 117/93  Pulse: (!) 106 94 99 94  Resp: 15 18 18 18   Temp: 99.6 F (37.6 C) 97.9 F (36.6 C) 98.3 F (36.8 C) 98.2 F (36.8 C)  TempSrc: Oral Oral Oral Oral  SpO2: 99% 98% 96% 95%  Weight:      Height:       CBC:  Recent Labs  Lab 07/23/20 0402 07/24/20 0451  WBC 12.9* 12.5*  HGB 9.9* 10.0*  HCT 35.4* 35.9*  MCV 77.6* 79.2*  PLT 915* 626*   Basic Metabolic Panel:  Recent Labs  Lab 07/23/20 0402 07/24/20 0451  NA 137 138  K 4.0 4.1  CL 102 100  CO2 28 28  GLUCOSE 108* 116*  BUN 6 8  CREATININE 0.74 0.53  CALCIUM 9.4 9.7    IMAGING past 24 hours No results found.  PHYSICAL EXAM General: caucasian female, lying in bed in NAD with lower half of body towards the door attempting to get out of bed, alert, waving right leg in the air.   Lungs: Symmetrical Chest rise, no labored breathing  Cardio: Regular Rate and Rhythm  Abdomen: Soft, non-tender  Skin: R hand remains in mitten today. But obvious movement of fingers and hand in mitten observed  Neuro - Alert oriented to person and place. Did not assess time or situation.  Speech is understandable with some dysarthria. Comprehension is intact. Right gaze preference with inattention to the left. Left facial droop. Tongue midline.RUE and RLE spontaneous movement against gravity and LUE flaccid and LLEexternally rotated.Sensation: Intact to  light touch.  Strength: Bulk appears normal at this time bilaterally. 3/5 in RUE and RLE. Decreased tone in LLE and and LUE. External rotation. 0/5 in LLE and LUE.  ASSESSMENT/PLAN Ms.Chrystel L Priceis a 35 y.o.femalewith history of uncontrolled diabetesandhypertensionpresenting with left sided weakness and slurred speech.Per family there is a complicated social situation at this time. She lives intermittently in an apartment by herself but was kicked out and they went to pick her up at 5:30 AM. At that point she seemed "off"and generally quiet but did not seem to have any difficulty with left-sided weakness.Family attributed this to psychosocial issues and potential substance use noting that her sister passed away from an overdose in October. Later in the morning she complained of diffuse body pain and was administered gabapentin and a muscle relaxer.At 11:30 AM she stood up and walkedto the showerand at1215 she was found on the bottom of the tub with slurred speech and difficulty moving the left side. She was not preactivated by EMS, but on arrival in the ED clearly appear to have a doorway examination consistent with a right MCA territory infarct.CTA revealed right ICA occlusion as well as a right common carotid bifurcation thrombus extending into the external carotid (felt to be the source of the occlusion). CT perfusion imaging demonstrated possible core infarct of 51 mL (though  notably this was obtained within 2 hours of symptom onset and therefore may reflect an overestimate of the true core). Mismatch ratio was 2 with a penumbra of 63 mL. Case was discussed with Dr. Estanislado Pandy of neuro interventional radiology and Dr. Cheral Marker of [facility] and the patient was accepted for transfer to [facility] for emergent intervention. She was also started on TPA after consenting the patient's emergency contact with full discussion of risks of bleeding and importance of accurately determining  the patient's last known well.Ms. Jeanann Lewandowsky confirm the last known well as described above and agreed that the patient would want TPA given the risk/benefit.She confirmed that the patient had no recent procedures or other contraindications to TPA.Regarding her baseline, she is functional at baseline and family believes she is smoking at [place] a day currently. She is on Metformin for her diabetes, does not take oral contraceptive pills, has had no recent surgeries or procedures. Patient has been scheduled for PEG placement 4/14. Patient was more alert this AM; although patient has been known to be more alert and oriented earlier in the day. Patient neuro exam remains stable. Patient remains unable to be accepted into the CIR program. Patient PLT decreased for the first time in 12 days 925> 830.  Patient CRP (2.9)and ESR (92) were elevated; however this is likely reactionary to patient's craniotomy. No Lupus anticoag was detected and patient's Factor V Leiden, Protein C, and Protein S were WNL. Patient Heparin level is now supratherapeutic after being subtherapeutic yesterday.   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   CT no acute finding but suspicious for right MCA early ischemic changes.   CTA head and neck showed right ICA terminal and proximal MCA occlusion, right CCA nonocclusive thrombus.  CT perfusion showed large penumbra.   Status post thrombectomy with right terminal ICA, right PCA and right ACA TICI2breperfusion however does have distal micro emboli.  MRI showed large right MCA infarct with 53m MLS and hemorrhagic transformation.   MRA showed patent  intracranial vessels.  CT Head WO Contrast 3/26 620 - Improved midline shift and normalized left lateral ventricular volume after decompressive craniectomy. No progressive infarct or hemorrhage.  2D Echo- EF: 60-65%. No wall motion abnormality.  TCD Bubble - No PFO  TEE today no source of emboli, no PFO  DVT [place]-No evidence of DVT bilaterally  LDL99  HgbA1c10.6  VTE prophylaxis -heparin IV  No antithromboticprior to admission, now on heparin IV for right ulnar artery occlusion, pharmacy managed  Therapy recommendations:CIR however no family support so SNF is working plan  Disposition:SNF due to lack of family support, will reconsult CIR as there has been difficulty in finding a SNF to accept patient due to age and + Cocaine on admission UDS  Cerebral edema- stable  CT Headrepeat3/24 -Continued interval evolution of right MCA distribution infarct with associated hemorrhagic transformation. Worsened regional mass effect and edema with increased right-to-left midline shift now measuring up to 9 mm.Increased dilatation of the right lateral ventricle concerning for developing ventricular trapping. No other new acute intracranial abnormality.  CT repeat 3/25 unchangedright cerebral cytotoxic edema with central hemorrhage. Midline shift is similar at 1 cm.Unchanged left lateral ventricle dilatation with evidence of transependymal flow.  S/pdecompressive hemicraniectomy (3/25)with Dr. SVertell Limber  CT Head WO Contrast 3/26 620 - Improved midline shift and normalized left lateral ventricular volume after decompressive craniectomy. No progressive infarct or hemorrhage.  On keppra 500 mg bid  3% salineoff  Na 137->141->147->150>158>150> 141>139>140>139>138  Staple removal completed on 4/8  Thrombocytosis - Plt 915> 830 today first day trending down - CRP (2.9)and ESR (92)  - Factor V Leiden, Protein C, and Protein S were WNL - Continue heparin - IV feraheme weekly  x2 per heme/onc recc's patient has no known allergies beyond Norco - Please see Heme/onc note, multiple test ordered  Fever, resolved Leukocytosis, improving  Tmax 101.7->102.5> 98.6> 98.7>99.9>afebrile  WBC -16.2>15.7>16.8>18.6->21.5->19.6->14.8>13.7>12.9>12.5>11.1  TEE no endocarditis  UA (4/3) neg  CXR4/4-left perihilar atelectasis  CXR 4/7 No acute findings   Abd Xray 4/7 No acute findings  Respiratory Culture (3/26) - Strep Pneumoniae  Blood CxNo growth after5days drawn 3/25  Completed 7 day course of Ceftriaxone on 4/2  ID on board, do not think endocarditis.  Tachycardia and tachypnea- improving Leukocytosis- improving  WBC -16.2>15.7>16.8>18.6->21.5->19.6->14.8>13.7>12.9>12.5>11.1  Likely related to ischemic right hand  on metoprolol 18m bid   UA: negative  LE venous doppler repeat (4/3): No evidence of deep vein thrombosis    R critical limb ischemia  S/p thrombectomy (3/24) for right brachial artery occlusion  Now Right hand pain, cool to touch, dry eschar wound on the palmar region, decreased dopplers per RN, and tachycardia, tachypnea with leukocytosis  UE arterial duplex (4/3) - right ulnar artery occlusion.   VVS Dr. CCarlis Abbott- No surgical intervention at this time. Recommends medical management with Heparin infusion.   Discussed with Dr. JRonnald Rampwith Neurosurgery ok'd the initiation of Heparin  Heparin infusion started on 4/3. Plan to transition to anticoagulation at discharge assuming no other issues per Dr. CCarlis Abbott  Percocet 5/325 q 6hr prn for right hand pain  Hyperlipidemia  Home meds:none  LDL 99, goal < 70  On lipitor 40 now  Continue statin at discharge  Poorly controlled Diabetes type II, with hyperglycemia   Home meds:Metformin, Glargine, Aspart  HgbA1c 10.6, goal < 7.0  CBGs  SSI  Levemir to 35 units bid  Novolog 6 units every 4 hours  Dysphagia  Feeding via cortrak  SLP following, NPO remains  in place , patient failed swallow study today  S/p Cortrak 4/8 - on TF @ 50 and IVF @ 217 SLP agrees with progression to PEG  APepco Holdingshas given verbal consent  Trauma surgery consulted for PEG placement tom 4/14 10am  Cocaine abuse  UDS positive for cocaine  Cessation education will be provided  Constipation- improving  On bowel regimen  BM 4/11  Sick thyroid - resolved  TSH: 0.307 (Low) Free T4:1.27 (high)  On metoprolol 25 bid  Repeat TSH and FT4 are WNL  IDA  Per Heme/onc recc's : IV Fereheme weekly x2  (1/2)  4/8 Iron level low at 21, Ferritin and TIBC wnl   Tobacco abuse  Current smoker  Smoking cessation counselingwill beprovided  Other Stroke Risk Factors  Substance abuse - UDS: THC POSITIVEWill advise patientto stop using due to stroke risk. Thrombocytosis likely reactive as platelet counts were normal on admission  Hospital day # 21  JDamita Dunnings MD PGY-1 I have personally obtained history,examined this patient, reviewed notes, independently viewed imaging studies, participated in medical decision making and plan of care.ROS completed by me personally and pertinent positives fully documented  I have made any additions or clarifications directly to the above  note. Agree with note above.  Discussed with patient's aunt at the bedside and answered questions about her care.  Discussed with Education officer, museum.  PlanPEG tube tomorrow and hopefully transfer to nursing home next week when bed available.  Patient may be a difficult placement because of lack of insurance.  Greater than 50% time during this 25-minute visit was spent on counseling and coordination of care about her stroke and dysphagia and need for PEG tube and answering questions and discussion with care team  Antony Contras, MD Medical Director [place] Pager: [phone] 07/25/2020 10:52 AM  To contact Stroke Continuity provider, please refer to  [email or web address] After hours, contact General Neurology

## 2020-07-25 NOTE — Progress Notes (Signed)
ANTICOAGULATION CONSULT NOTE - Follow Up Consult  Pharmacy Consult for Heparin Indication: RUE occlusion  Allergies  Allergen Reactions  . Norco [Hydrocodone-Acetaminophen] Itching, Nausea And Vomiting and Swelling    Patient Measurements: Height: 5\' 5"  (165.1 cm) Weight: 69.7 kg (153 lb 10.6 oz) IBW/kg (Calculated) : 57  Vital Signs: Temp: 98.2 F (36.8 C) (04/13 0730) Temp Source: Oral (04/13 0730) BP: 117/93 (04/13 0730) Pulse Rate: 94 (04/13 0730)  Labs: Recent Labs    07/23/20 0402 07/23/20 1431 07/24/20 0451 07/25/20 0633 07/25/20 1000  HGB 9.9*  --  10.0* 9.6*  --   HCT 35.4*  --  35.9* 34.6*  --   PLT 915*  --  925* 830*  --   HEPARINUNFRC 0.44   < > 0.22* 0.85* 0.63  CREATININE 0.74  --  0.53  --   --    < > = values in this interval not displayed.    Estimated Creatinine Clearance: 96.2 mL/min (by C-G formula based on SCr of 0.53 mg/dL).  Assessment: 35 y.o. female with RUE occlusion s/p thrombectomy continues on heparin Heparin level this AM slightly supra-therapeutic at 0.63 Planning PEG 4/14 Heparin to stop at 4:30 am 4/14  Goal of Therapy:  Heparin level 0.3-0.5 units/mL Monitor platelets by anticoagulation protocol: Yes   Plan:  Decrease heparin to 2550 units / hr Follow up after PEG tube placement 4/14  Thank you 5/14, PharmD

## 2020-07-25 NOTE — NC FL2 (Addendum)
[place] LEVEL OF CARE SCREENING TOOL     IDENTIFICATION  Patient Name: Brandi Myers Birthdate: 08-16-1985 Sex: female Admission Date (Current Location): 07/04/2020  [facility] and [place] Number:  Herbalist and Address:  [facility] [place], [place] [address] [place], [place] [address]      Provider Number: [id number]  Attending Physician Name and Address:  Garvin Fila, MD  Relative Name and Phone Number:       Current Level of Care: Hospital Recommended Level of Care: [place] Prior Approval Number:    Date Approved/Denied:   PASRR Number: [id number]  Discharge Plan: SNF    Current Diagnoses: Patient Active Problem List   Diagnosis Date Noted   Thrombocytosis    Leukocytosis    Arterial thrombosis ([place])    Stroke (cerebrum) ([place]) 07/04/2020   Middle cerebral artery embolism, 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   Cellulitis 02/26/2016    Orientation RESPIRATION BLADDER Height & Weight     Self,Time,Situation,Place  Normal Incontinent Weight: 153 lb 10.6 oz (69.7 kg) Height:  5' 5"  (165.1 cm)  BEHAVIORAL SYMPTOMS/MOOD NEUROLOGICAL BOWEL NUTRITION STATUS      Incontinent Diet (See DC Summary)  AMBULATORY STATUS COMMUNICATION OF NEEDS Skin   Extensive Assist Verbally Surgical wounds,Skin abrasions (Closed Incisions: Arm right, head right, abdomen right. Open Wound: Venous stasis ulcer Hand anterior; right)                       Personal Care Assistance Level of Assistance  Bathing,Feeding,Dressing Bathing Assistance: Maximum assistance Feeding assistance: Maximum assistance Dressing Assistance: Maximum assistance     Functional Limitations Info  Sight,Hearing,Speech Sight Info: Impaired Hearing Info: Adequate Speech Info: Adequate    SPECIAL CARE FACTORS FREQUENCY  PT (By licensed PT),OT (By licensed OT),Speech therapy     PT Frequency: 5x/week OT Frequency: 5x/week     Speech Therapy Frequency:  5x/week      Contractures Contractures Info: Not present    Additional Factors Info  Code Status,Allergies Code Status Info: Full Allergies Info: Norco (Hydrocodone-acetaminophen)           Current Medications (07/25/2020):  This is the current hospital active medication list Current Facility-Administered Medications  Medication Dose Route Frequency Provider Last Rate Last Admin   0.9 %  sodium chloride infusion   Intravenous PRN Rosalin Hawking, MD   Stopped at 07/13/20 1028   0.9 %  sodium chloride infusion   Intravenous Continuous Rosalin Hawking, MD 25 mL/hr at 07/25/20 0712 New Bag at 07/25/20 6606   acetaminophen (TYLENOL) tablet 650 mg  650 mg Oral Q4H PRN Erline Levine, MD       Or   acetaminophen (TYLENOL) 160 MG/5ML solution 650 mg  650 mg Per Tube Q4H PRN Erline Levine, MD   650 mg at 07/25/20 1125   Or   acetaminophen (TYLENOL) suppository 650 mg  650 mg Rectal Q4H PRN Erline Levine, MD       atorvastatin (LIPITOR) tablet 40 mg  40 mg Per Tube q1800 Pierce, Dwayne A, RPH   40 mg at 07/24/20 1820   bisacodyl (DULCOLAX) suppository 10 mg  10 mg Rectal Daily PRN Bailey-Modzik, Delila A, NP       chlorhexidine gluconate (MEDLINE KIT) (PERIDEX) 0.12 % solution 15 mL  15 mL Mouth Rinse BID Erline Levine, MD   15 mL at 07/24/20 2101   Chlorhexidine Gluconate Cloth 2 % PADS 6 each  6 each  Topical Daily Erline Levine, MD   6 each at 07/24/20 1000   docusate (COLACE) 50 MG/5ML liquid 100 mg  100 mg Per Tube BID Erline Levine, MD   100 mg at 07/25/20 1125   feeding supplement (GLUCERNA 1.5 CAL) liquid 1,000 mL  1,000 mL Per Tube Continuous Candee Furbish, MD 50 mL/hr at 07/24/20 2350 1,000 mL at 07/24/20 2350   ferumoxytol (FERAHEME) 510 mg in sodium chloride 0.9 % 100 mL IVPB  510 mg Intravenous Weekly Freida Busman, MD   Stopped at 07/24/20 2152   gabapentin (NEURONTIN) 250 MG/5ML solution 600 mg  600 mg Per Tube Q8H Garvin Fila, MD   600 mg at 07/25/20 7858   heparin ADULT infusion  100 units/mL (25000 units/292m)  2,600 Units/hr Intravenous Continuous SGarvin Fila MD 26 mL/hr at 07/25/20 0932 2,600 Units/hr at 07/25/20 0932   insulin aspart (novoLOG) injection 0-15 Units  0-15 Units Subcutaneous Q4H SErline Levine MD   3 Units at 07/25/20 0858   insulin aspart (novoLOG) injection 6 Units  6 Units Subcutaneous Q4H SCandee Furbish MD   6 Units at 07/25/20 0859   insulin detemir (LEVEMIR) injection 35 Units  35 Units Subcutaneous BID Bhagat, Srishti L, MD   35 Units at 07/25/20 0900   labetalol (NORMODYNE) injection 10 mg  10 mg Intravenous Q2H PRN Olivencia-Simmons, Ivelisse, NP       levETIRAcetam (KEPPRA) 100 MG/ML solution 500 mg  500 mg Per Tube BID Pham, Minh Q, RPH-CPP   500 mg at 07/25/20 1125   MEDLINE mouth rinse  15 mL Mouth Rinse q12n4p XRosalin Hawking MD   15 mL at 07/24/20 1700   melatonin tablet 3 mg  3 mg Oral QHS MDamita DunningsB, MD       metoprolol tartrate (LOPRESSOR) tablet 50 mg  50 mg Per Tube BID SGarvin Fila MD   50 mg at 07/25/20 1125   multivitamin with minerals tablet 1 tablet  1 tablet Per Tube Daily SCandee Furbish MD   1 tablet at 07/25/20 1125   ondansetron (ZOFRAN) tablet 4 mg  4 mg Oral Q4H PRN SErline Levine MD   4 mg at 07/19/20 1137   Or   ondansetron (ZOFRAN) injection 4 mg  4 mg Intravenous Q4H PRN SErline Levine MD       polyethylene glycol (MIRALAX / GFloria Raveling packet 17 g  17 g Per Tube Daily SErline Levine MD   17 g at 07/25/20 1127   senna-docusate (Senokot-S) tablet 1 tablet  1 tablet Per Tube QHS PRN SErline Levine MD       sodium chloride flush (NS) 0.9 % injection 10-40 mL  10-40 mL Intracatheter Q12H CMarty Heck MD   10 mL at 07/24/20 2105     Discharge Medications: Please see discharge summary for a list of discharge medications.  Relevant Imaging Results:  Relevant Lab Results:   Additional Information SSN: [id number] [facility] Setting [facility]   I have personally obtained history,examined  this patient, reviewed notes, independently viewed imaging studies, participated in medical decision making and plan of care.ROS completed by me personally and pertinent positives fully documented  I have made any additions or clarifications directly to the above note. Agree with note above.    PAntony Contras MD Medical Director [facility] Pager: [phone]/13/2022 12:38 PM

## 2020-07-25 NOTE — TOC Initial Note (Signed)
Transition of Care [facility]) - Initial/Assessment Note    Patient Details  Name: Brandi Myers MRN: [id number] Date of Birth: 09/01/1985  Transition [facility]) CM/SW Contact:    Brandi Myers, Student-Social Work Phone Number: 07/25/2020, 11:40 AM  Clinical Narrative:      Brandi Myers with Rehab spoke to Pt. Aunt who said she would want SNF since CIR denied. MSW Student filled out FL2, pending bed offers.          Expected Discharge Plan: Skilled Nursing Facility Barriers to Discharge: Continued Medical Work up,Insurance Authorization   Patient Goals and CMS Choice Patient states their goals for this hospitalization and ongoing recovery are:: To go to rehab CMS Medicare.gov Compare Post Acute Care list provided to:: Patient Represenative (must comment) Choice offered to / list presented to :  (Aunt)  Expected Discharge Plan and Services Expected Discharge Plan: Skilled Nursing Facility       Living arrangements for the past 2 months: Single Family Home                                      Prior Living Arrangements/Services Living arrangements for the past 2 months: Single Family Home Lives with:: Relatives Patient language and need for interpreter reviewed:: No Do you feel safe going back to the place where you live?: Yes      Need for Family Participation in Patient Care: Yes (Comment) Care giver support system in place?: No (comment)   Criminal Activity/Legal Involvement Pertinent to Current Situation/Hospitalization: No - Comment as needed  Activities of Daily Living Home Assistive Devices/Equipment: None ADL Screening (condition at time of admission) Patient's cognitive ability adequate to safely complete daily activities?: Yes Is the patient deaf or have difficulty hearing?: No Does the patient have difficulty seeing, even when wearing glasses/contacts?: No Does the patient have difficulty concentrating, remembering, or making decisions?: No Patient able to  express need for assistance with ADLs?: Yes Does the patient have difficulty dressing or bathing?: No Independently performs ADLs?: Yes (appropriate for developmental age) Does the patient have difficulty walking or climbing stairs?: No Weakness of Legs: None Weakness of Arms/Hands: None  Permission Sought/Granted Permission sought to share information with : Facility Contractor granted to share information with : Yes, Verbal Permission Granted  Share Information with NAME: Brandi Myers  Permission granted to share info w AGENCY: SNF  Permission granted to share info w Relationship: Aunt     Emotional Assessment Appearance:: Appears older than stated age Attitude/Demeanor/Rapport: Complaining,Apprehensive Affect (typically observed): Frustrated Orientation: : Oriented to Self,Oriented to Place,Oriented to  Time,Oriented to Situation Alcohol / Substance Use: Alcohol Use Psych Involvement: No (comment)  Admission diagnosis:  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 Thrombocytosis   . Leukocytosis   . Arterial thrombosis (HCC)   . 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 Cellulitis 02/26/2016   PCP:  [name]:   [facility] [address] ([address]), [place] - [address] [place]) [place] [address] Phone: [phone] Fax: [phone]     Social Determinants of Health (SDOH) Interventions    Readmission Risk Interventions No flowsheet data found.

## 2020-07-25 NOTE — Progress Notes (Signed)
Occupational Therapy Treatment Patient Details Name: Brandi Myers MRN: [id number] DOB: 1985/10/09 Today's Date: 07/25/2020    History of present illness 35 yo female admitted with L sided weakness givent TPA for R MCA involving basal ganglia frontal temporal lobes  3/23 s/p BIL revascularization  R MCA ACA achieving 2B revascularization PT intubated starting 3/23 . 3/24 R UE thromectomy CT reveals worsening midline shift start 3% saline 3/25 decompressive hemicranitectomy R side bone flap in abdomen 3/25 fever 103. Extubated 3/31. PMH current smoker uncontrolled DM HTn   OT comments  Patient supine in bed and calling for therapist to enter room, reporting need to use restroom and asking to get OOB.  Therapist educated pt on safety and assisted onto bedpan with max assist to roll in bed until further assist arrived.  At this time, patient oriented and alert (even able to correct situation and day of the week after therapist said "you are one day ahead of me").  Initiated to EOB with total assist +2 and sitting EOB with total assist +2, pushing towards L side and only improving once placed on R elbow. Once EOB patient fatigued, participation declined, and pt following only intermittent commands, therefore OT/PT decided unsafe to attempt transfer to [facility] using stedy. Total assist for toielting bed level and LB dressing bed level.   Noted increased flexion tone in L wrist/hand, remains flaccid proximally.  Placed UE on pillow with hand in extension, and ordered resting hand splint (discussed with MD this am).  Will follow acutely.    Follow Up Recommendations  SNF;Supervision/Assistance - 24 hour    Equipment Recommendations  3 in 1 bedside commode;Wheelchair (measurements OT);Wheelchair cushion (measurements OT);Hospital bed    Recommendations for Other Services Rehab consult;Speech consult    Precautions / Restrictions Precautions Precautions: Fall Precaution Comments: bone flap out in R  abdomen, L hemiparesis, cortrak, helmet with activity Restrictions Weight Bearing Restrictions: No       Mobility Bed Mobility Overal bed mobility: Needs Assistance Bed Mobility: Supine to Sit;Sit to Supine Rolling: Total assist;+2 for safety/equipment   Supine to sit: Total assist;+2 for physical assistance;+2 for safety/equipment Sit to supine: Total assist;+2 for physical assistance;+2 for safety/equipment   General bed mobility comments: Pt did initiate LUE reaching for railing however once LUE got to midline the effort stopped and pt required +2 total assist to complete roll. +2 total assist required for all aspects of bed mobility, sliding up in bed, and positioning.    Transfers Overall transfer level: Needs assistance Equipment used: 2 person hand held assist Transfers: Sit to/from Stand           General transfer comment: Attempted to get pt set up with the [facility] for transfer to [facility]. Pt requiring +2 total assist for sitting balance during set up and at that point pt not following any commands. PT/OT deemed it unsafe to attempt transfer even with the [facility] and pt was returned to supine.    Balance Overall balance assessment: Needs assistance Sitting-balance support: Single extremity supported;Feet supported Sitting balance-Leahy Scale: Zero Sitting balance - Comments: total assist with brief 5-10 second bouts of initiating anterior lean to mighter max assist. Postural control: Left lateral lean                                 ADL either performed or assessed with clinical judgement   ADL Overall ADL's : Needs assistance/impaired Eating/Feeding: NPO  Lower Body Dressing: Total assistance;+2 for physical assistance;+2 for safety/equipment;Sitting/lateral leans;Bed level     Toilet Transfer Details (indicate cue type and reason): deferred, not safe once EOB Toileting- Clothing Manipulation and Hygiene: Bed level;+2 for physical  assistance;+2 for safety/equipment;Total assistance Toileting - Clothing Manipulation Details (indicate cue type and reason): total assist to manage clothing and doff brief     Functional mobility during ADLs: Total assistance;+2 for physical assistance;+2 for safety/equipment General ADL Comments: total assist for all ADLs     Vision       Perception     Praxis      Cognition Arousal/Alertness: Awake/alert Behavior During Therapy: Flat affect;Impulsive;Restless Overall Cognitive Status: Impaired/Different from baseline Area of Impairment: Attention;Following commands;Problem solving;Awareness;Safety/judgement;Memory;Orientation                 Orientation Level: Disoriented to;Situation Current Attention Level: Focused Memory: Decreased short-term memory;Decreased recall of precautions Following Commands: Follows one step commands inconsistently Safety/Judgement: Decreased awareness of safety;Decreased awareness of deficits Awareness: Intellectual Problem Solving: Slow processing;Decreased initiation;Difficulty sequencing;Requires verbal cues;Requires tactile cues General Comments: Pt initally oriented to self, place and time, reports in hopstial for covid but able to correct to "stroke".  Even voiced "thursday" and able to correct to 'Wed" when therapist said "you are one day ahead of me". Pt eager to get on the bedside commode to attempt a BM. Once mobility was initiated, pt closing eyes and with intermittent command following and providing minimal response to therapists.        Exercises Exercises: Other exercises Other Exercises Other Exercises: gentle PROM to L UE from shoulder to hand, L UE tone increasing distally; repositioned UE on pillow.   Shoulder Instructions       General Comments      Pertinent Vitals/ Pain       Pain Assessment: Faces Faces Pain Scale: Hurts little more Pain Location: R wrist/hand Pain Descriptors / Indicators: Guarding  (hyperfocused) Pain Intervention(s): Limited activity within patient's tolerance;Monitored during session;Repositioned  Home Living                                          Prior Functioning/Environment              Frequency  Min 2X/week        Progress Toward Goals  OT Goals(current goals can now be found in the care plan section)  Progress towards OT goals: Progressing toward goals  Acute Rehab OT Goals Patient Stated Goal: to walk  Plan Discharge plan remains appropriate;Frequency remains appropriate    Co-evaluation    PT/OT/SLP Co-Evaluation/Treatment: Yes Reason for Co-Treatment: Complexity of the patient's impairments (multi-system involvement);Necessary to address cognition/behavior during functional activity;For patient/therapist safety;To address functional/ADL transfers PT goals addressed during session: Mobility/safety with mobility;Balance OT goals addressed during session: ADL's and self-care      AM-PAC OT "6 Clicks" Daily Activity     Outcome Measure   Help from another person eating meals?: Total Help from another person taking care of personal grooming?: A Lot Help from another person toileting, which includes using toliet, bedpan, or urinal?: Total Help from another person bathing (including washing, rinsing, drying)?: Total Help from another person to put on and taking off regular upper body clothing?: Total Help from another person to put on and taking off regular lower body clothing?: Total 6 Click Score: 7    End  of Session Equipment Utilized During Treatment: Gait belt  OT Visit Diagnosis: Unsteadiness on feet (R26.81);Other abnormalities of gait and mobility (R26.89);Muscle weakness (generalized) (M62.81);Hemiplegia and hemiparesis Hemiplegia - Right/Left: Left Hemiplegia - dominant/non-dominant: Non-Dominant Hemiplegia - caused by: Cerebral infarction   Activity Tolerance Patient limited by lethargy   Patient Left  in bed;with call bell/phone within reach;with bed alarm set;with restraints reapplied;with nursing/sitter in room   Nurse Communication Mobility status        Time: 8309-4076 OT Time Calculation (min): 38 min  Charges: OT General Charges $OT Visit: 1 Visit OT Treatments $Self Care/Home Management : 23-37 mins  Barry Brunner, OT Acute Rehabilitation Services Pager [phone] Office [phone]    Brandi Myers 07/25/2020, 3:56 PM

## 2020-07-25 NOTE — Progress Notes (Signed)
Orthopedic Tech Progress Note Patient Details:  Brandi Myers 03/30/1986 [id number] Called in order to [employer] for a RESTING HAND SPLINT Patient ID: Brandi Myers, female   DOB: July 28, 1985, 35 y.o.   MRN: [id number]   [name] 07/25/2020, 3:54 PM

## 2020-07-25 NOTE — Progress Notes (Signed)
Physical Therapy Treatment Patient Details Name: Brandi Myers MRN: [id number] DOB: [birth year] Today's Date: 07/25/2020    History of Present Illness 35 yo female admitted with L sided weakness givent TPA for R MCA involving basal ganglia frontal temporal lobes  3/23 s/p BIL revascularization  R MCA ACA achieving 2B revascularization PT intubated starting 3/23 . 3/24 R UE thromectomy CT reveals worsening midline shift start 3% saline 3/25 decompressive hemicranitectomy R side bone flap in abdomen 3/25 fever 103. Extubated 3/31. PMH current smoker uncontrolled DM HTn   PT Comments     Initially more engaged with session, wanting to get on the bedside commode to attempt a BM. Once mobility was initiated, pt closing eyes and with intermittent command following and providing minimal response to therapists. Attempted Stedy without success, however if pt remained engaged I would be optimistic to be able to get to [facility]. Will continue to follow and progress as able per POC.    Follow Up Recommendations  SNF (CIR denied pt)     Equipment Recommendations  Wheelchair (measurements PT);Wheelchair cushion (measurements PT);Hospital bed (mechanical lift)    Recommendations for Other Services       Precautions / Restrictions Precautions Precautions: Fall Precaution Comments: bone flap out in R abdomen, L hemiparesis, cortrak, helmet with activity Restrictions Weight Bearing Restrictions: No    Mobility  Bed Mobility Overal bed mobility: Needs Assistance Bed Mobility: Supine to Sit;Sit to Supine Rolling: Total assist;+2 for safety/equipment   Supine to sit: Total assist;+2 for physical assistance;+2 for safety/equipment Sit to supine: Total assist;+2 for physical assistance;+2 for safety/equipment   General bed mobility comments: Pt did initiate LUE reaching for railing however once LUE got to midline the effort stopped and pt required +2 total assist to complete roll. +2 total assist  required for all aspects of bed mobility, sliding up in bed, and positioning.    Transfers Overall transfer level: Needs assistance Equipment used: 2 person hand held assist Transfers: Sit to/from Stand           General transfer comment: Attempted to get pt set up with the [facility] for transfer to [facility]. Pt requiring +2 total assist for sitting balance during set up and at that point pt not following any commands. PT/OT deemed it unsafe to attempt transfer even with the [facility] and pt was returned to supine.  Ambulation/Gait                 Stairs             Wheelchair Mobility    Modified Rankin (Stroke Patients Only) Modified Rankin (Stroke Patients Only) Pre-Morbid Rankin Score: No symptoms Modified Rankin: Severe disability     Balance Overall balance assessment: Needs assistance Sitting-balance support: Single extremity supported;Feet supported Sitting balance-Leahy Scale: Zero Sitting balance - Comments: total assist with brief 5-10 second bouts of initiating anterior lean to mighter max assist. Postural control: Left lateral lean                                  Cognition Arousal/Alertness: Awake/alert Behavior During Therapy: Flat affect;Impulsive;Restless Overall Cognitive Status: Impaired/Different from baseline Area of Impairment: Attention;Following commands;Problem solving;Awareness;Safety/judgement;Memory;Orientation                 Orientation Level: Disoriented to;Place;Time;Situation Current Attention Level: Focused Memory: Decreased short-term memory;Decreased recall of precautions Following Commands: Follows one step commands inconsistently Safety/Judgement: Decreased awareness of safety;Decreased  awareness of deficits Awareness: Intellectual Problem Solving: Slow processing;Decreased initiation;Difficulty sequencing;Requires verbal cues;Requires tactile cues General Comments: Initially more engaged with session, wanting  to get on the bedside commode to attempt a BM. Once mobility was initiated, pt closing eyes and with intermittent command following and providing minimal response to therapists.      Exercises      General Comments        Pertinent Vitals/Pain Pain Assessment: Faces Faces Pain Scale: Hurts little more Pain Location: R wrist/hand Pain Descriptors / Indicators: Guarding (hyperfocused) Pain Intervention(s): Limited activity within patient's tolerance;Monitored during session;Repositioned    Home Living                      Prior Function            PT Goals (current goals can now be found in the care plan section) Acute Rehab PT Goals Patient Stated Goal: to walk PT Goal Formulation: With patient Time For Goal Achievement: 08/06/20 Potential to Achieve Goals: Fair Progress towards PT goals: Progressing toward goals    Frequency    Min 3X/week      PT Plan Frequency needs to be updated    Co-evaluation PT/OT/SLP Co-Evaluation/Treatment: Yes Reason for Co-Treatment: Complexity of the patient's impairments (multi-system involvement);Necessary to address cognition/behavior during functional activity;For patient/therapist safety;To address functional/ADL transfers PT goals addressed during session: Mobility/safety with mobility;Balance        AM-PAC PT "6 Clicks" Mobility   Outcome Measure  Help needed turning from your back to your side while in a flat bed without using bedrails?: Total Help needed moving from lying on your back to sitting on the side of a flat bed without using bedrails?: Total Help needed moving to and from a bed to a chair (including a wheelchair)?: Total Help needed standing up from a chair using your arms (e.g., wheelchair or bedside chair)?: Total Help needed to walk in hospital room?: Total Help needed climbing 3-5 steps with a railing? : Total 6 Click Score: 6    End of Session Equipment Utilized During Treatment: Other (comment)  (helmet) Activity Tolerance: Patient tolerated treatment well Patient left: in bed;with bed alarm set;with call bell/phone within reach Nurse Communication: Mobility status;Need for lift equipment;Other (comment) (May benefit from veil bed) PT Visit Diagnosis: Other abnormalities of gait and mobility (R26.89);Hemiplegia and hemiparesis;Other symptoms and signs involving the nervous system (R29.898);Muscle weakness (generalized) (M62.81);Difficulty in walking, not elsewhere classified (R26.2) Hemiplegia - Right/Left: Left Hemiplegia - dominant/non-dominant: Non-dominant Hemiplegia - caused by: Cerebral infarction     Time: 3734-2876 PT Time Calculation (min) (ACUTE ONLY): 29 min  Charges:  $Therapeutic Activity: 8-22 mins                     Conni Slipper, PT, DPT Acute Rehabilitation Services Pager: [phone] Office: [phone]    Marylynn Pearson 07/25/2020, 3:34 PM

## 2020-07-26 ENCOUNTER — Encounter (HOSPITAL_COMMUNITY)
Admission: EM | Disposition: A | Discharge status: Skilled Nursing Facility | Payer: Self-pay | Source: Other Acute Inpatient Hospital | Attending: Neurology

## 2020-07-26 ENCOUNTER — Inpatient Hospital Stay (HOSPITAL_COMMUNITY): Payer: Medicaid Other | Admitting: Certified Registered"

## 2020-07-26 ENCOUNTER — Encounter (HOSPITAL_COMMUNITY): Payer: Self-pay | Admitting: Neurology

## 2020-07-26 DIAGNOSIS — I749 Embolism and thrombosis of unspecified artery: Secondary | ICD-10-CM | POA: Diagnosis not present

## 2020-07-26 DIAGNOSIS — D75839 Thrombocytosis, unspecified: Secondary | ICD-10-CM | POA: Diagnosis not present

## 2020-07-26 DIAGNOSIS — D72829 Elevated white blood cell count, unspecified: Secondary | ICD-10-CM | POA: Diagnosis not present

## 2020-07-26 DIAGNOSIS — I63131 Cerebral infarction due to embolism of right carotid artery: Secondary | ICD-10-CM | POA: Diagnosis not present

## 2020-07-26 HISTORY — PX: PEG PLACEMENT: SHX5437

## 2020-07-26 HISTORY — PX: ESOPHAGOGASTRODUODENOSCOPY (EGD) WITH PROPOFOL: SHX5813

## 2020-07-26 LAB — GLUCOSE, CAPILLARY
Glucose-Capillary: 85 mg/dL (ref 70–99)
Glucose-Capillary: 99 mg/dL (ref 70–99)
Glucose-Capillary: 101 mg/dL — ABNORMAL HIGH (ref 70–99)
Glucose-Capillary: 134 mg/dL — ABNORMAL HIGH (ref 70–99)
Glucose-Capillary: 111 mg/dL — ABNORMAL HIGH (ref 70–99)
Glucose-Capillary: 162 mg/dL — ABNORMAL HIGH (ref 70–99)

## 2020-07-26 LAB — BASIC METABOLIC PANEL
Anion gap: 10 (ref 5–15)
BUN: 7 mg/dL (ref 6–20)
CO2: 27 mmol/L (ref 22–32)
Calcium: 9.7 mg/dL (ref 8.9–10.3)
Chloride: 100 mmol/L (ref 98–111)
Creatinine, Ser: 0.48 mg/dL (ref 0.44–1.00)
GFR, Estimated: >60 mL/min (ref >60)
Glucose, Bld: 63 mg/dL — ABNORMAL LOW (ref 70–99)
Potassium: 3.9 mmol/L (ref 3.5–5.1)
Sodium: 137 mmol/L (ref 135–145)

## 2020-07-26 LAB — PROTEIN ELECTROPHORESIS, SERUM
A/G Ratio: 0.6 — ABNORMAL LOW (ref 0.7–1.7)
Albumin ELP: 3.1 g/dL (ref 2.9–4.4)
Alpha-1-Globulin: 0.4 g/dL (ref 0.0–0.4)
Alpha-2-Globulin: 1.2 g/dL — ABNORMAL HIGH (ref 0.4–1.0)
Beta Globulin: 1.3 g/dL (ref 0.7–1.3)
Gamma Globulin: 1.9 g/dL — ABNORMAL HIGH (ref 0.4–1.8)
Globulin, Total: 4.9 g/dL — ABNORMAL HIGH (ref 2.2–3.9)
Total Protein ELP: 8 g/dL (ref 6.0–8.5)

## 2020-07-26 LAB — HEPARIN LEVEL (UNFRACTIONATED): Heparin Unfractionated: >1.1 IU/mL — ABNORMAL HIGH (ref 0.30–0.70)

## 2020-07-26 LAB — CBC
HCT: 34.2 % — ABNORMAL LOW (ref 36.0–46.0)
Hemoglobin: 9.6 g/dL — ABNORMAL LOW (ref 12.0–15.0)
MCH: 21.8 pg — ABNORMAL LOW (ref 26.0–34.0)
MCHC: 28.1 g/dL — ABNORMAL LOW (ref 30.0–36.0)
MCV: 77.7 fL — ABNORMAL LOW (ref 80.0–100.0)
Platelets: 775 10*3/uL — ABNORMAL HIGH (ref 150–400)
RBC: 4.4 MIL/uL (ref 3.87–5.11)
RDW: 24.5 % — ABNORMAL HIGH (ref 11.5–15.5)
WBC: 11 10*3/uL — ABNORMAL HIGH (ref 4.0–10.5)
nRBC: 0 % (ref 0.0–0.2)

## 2020-07-26 LAB — HGB FRACTIONATION CASCADE
Hgb A2: 2.1 % (ref 1.8–3.2)
Hgb A: 97.9 % (ref 96.4–98.8)
Hgb F: 0 % (ref 0.0–2.0)
Hgb S: 0 %

## 2020-07-26 LAB — PROTEIN C, TOTAL: Protein C, Total: 98 % (ref 60–150)

## 2020-07-26 LAB — ANTINUCLEAR ANTIBODIES, IFA: ANA Ab, IFA: NEGATIVE

## 2020-07-26 SURGERY — ESOPHAGOGASTRODUODENOSCOPY (EGD) WITH PROPOFOL
Anesthesia: Monitor Anesthesia Care

## 2020-07-26 MED ORDER — LIDOCAINE 2% (20 MG/ML) 5 ML SYRINGE
INTRAMUSCULAR | Status: DC | PRN
  Administered 2020-07-26: 100 mg via INTRAVENOUS

## 2020-07-26 MED ORDER — FENTANYL CITRATE (PF) 250 MCG/5ML IJ SOLN
INTRAMUSCULAR | Status: DC | PRN
  Administered 2020-07-26: 100 ug via INTRAVENOUS

## 2020-07-26 MED ORDER — PROPOFOL 500 MG/50ML IV EMUL
INTRAVENOUS | Status: DC | PRN
  Administered 2020-07-26: 125 ug/kg/min via INTRAVENOUS

## 2020-07-26 MED ORDER — HEPARIN (PORCINE) 25000 UT/250ML-% IV SOLN
2550.0000 [IU]/h | INTRAVENOUS | Status: DC
  Administered 2020-07-26 – 2020-07-27 (×2): 2550 [IU]/h via INTRAVENOUS
  Filled 2020-07-26 (×2): qty 250

## 2020-07-26 MED ORDER — PROPOFOL 10 MG/ML IV BOLUS
INTRAVENOUS | Status: DC | PRN
  Administered 2020-07-26: 50 mg via INTRAVENOUS

## 2020-07-26 NOTE — Transfer of Care (Signed)
Immediate Anesthesia Transfer of Care Note  Patient: Brandi Myers  Procedure(s) Performed: ESOPHAGOGASTRODUODENOSCOPY (EGD) WITH PROPOFOL (N/A ) PERCUTANEOUS ENDOSCOPIC GASTROSTOMY (PEG) PLACEMENT (N/A )  Patient Location: Endoscopy Unit  Anesthesia Type:MAC  Level of Consciousness: drowsy and patient cooperative  Airway & Oxygen Therapy: Patient Spontanous Breathing  Post-op Assessment: Report given to RN and Post -op Vital signs reviewed and stable  Post vital signs: Reviewed and stable  Last Vitals:  Vitals Value Taken Time  BP 138/70 07/26/20 1053  Temp    Pulse    Resp    SpO2    Vitals shown include unvalidated device data.  Last Pain:  Vitals:   07/26/20 0940  TempSrc: Temporal  PainSc: 0-No pain      Patients Stated Pain Goal: 0 (22/29/79 8921)  Complications: No complications documented.

## 2020-07-26 NOTE — Progress Notes (Signed)
Pt in endoscopy for PEG placement.

## 2020-07-26 NOTE — Anesthesia Preprocedure Evaluation (Addendum)
Anesthesia Evaluation  Patient identified by MRN, date of birth, ID band Patient awake    Reviewed: Allergy & Precautions, H&P , NPO status , Patient's Chart, lab work & pertinent test results, reviewed documented beta blocker date and time   Airway Mallampati: II  TM Distance: >3 FB Neck ROM: Full    Dental no notable dental hx. (+) Poor Dentition, Dental Advisory Given   Pulmonary Current Smoker and Patient abstained from smoking.,    Pulmonary exam normal breath sounds clear to auscultation       Cardiovascular hypertension, Pt. on medications and Pt. on home beta blockers  Rhythm:Regular Rate:Normal     Neuro/Psych CVA, Residual Symptoms negative psych ROS   GI/Hepatic negative GI ROS, Neg liver ROS,   Endo/Other  diabetes, Insulin Dependent  Renal/GU negative Renal ROS  negative genitourinary   Musculoskeletal   Abdominal   Peds  Hematology negative hematology ROS (+)   Anesthesia Other Findings   Reproductive/Obstetrics negative OB ROS                            Anesthesia Physical Anesthesia Plan  ASA: III  Anesthesia Plan: MAC   Post-op Pain Management:    Induction: Intravenous  PONV Risk Score and Plan: 2 and Propofol infusion and Ondansetron  Airway Management Planned: Nasal Cannula  Additional Equipment:   Intra-op Plan:   Post-operative Plan:   Informed Consent: I have reviewed the patients History and Physical, chart, labs and discussed the procedure including the risks, benefits and alternatives for the proposed anesthesia with the patient or authorized representative who has indicated his/her understanding and acceptance.     Dental advisory given  Plan Discussed with: CRNA  Anesthesia Plan Comments:         Anesthesia Quick Evaluation

## 2020-07-26 NOTE — Progress Notes (Signed)
OT Cancellation Note  Patient Details Name: RAGNA KRAMLICH MRN: [id number] DOB: 1986/03/27   Cancelled Treatment:    Reason Eval/Treat Not Completed: Patient at procedure or test/ unavailable.  Will reattempt.  Eber Jones., OTR/L Acute Rehabilitation Services Pager [phone] Office [phone]   Jeani Hawking M 07/26/2020, 10:40 AM

## 2020-07-26 NOTE — Anesthesia Postprocedure Evaluation (Signed)
Anesthesia Post Note  Patient: SYRIANA CROSLIN  Procedure(s) Performed: ESOPHAGOGASTRODUODENOSCOPY (EGD) WITH PROPOFOL (N/A ) PERCUTANEOUS ENDOSCOPIC GASTROSTOMY (PEG) PLACEMENT (N/A )     Patient location during evaluation: Endoscopy Anesthesia Type: MAC Level of consciousness: awake and alert Pain management: pain level controlled Vital Signs Assessment: post-procedure vital signs reviewed and stable Respiratory status: spontaneous breathing, nonlabored ventilation and respiratory function stable Cardiovascular status: stable and blood pressure returned to baseline Postop Assessment: no apparent nausea or vomiting Anesthetic complications: no   No complications documented.  Last Vitals:  Vitals:   07/26/20 1053 07/26/20 1106  BP: 138/70 (!) 154/80  Pulse: (!) 103 97  Resp: 12 12  Temp: 36.6 C   SpO2: 98% 97%    Last Pain:  Vitals:   07/26/20 1106  TempSrc:   PainSc: Asleep                 Markeeta Scalf,W. EDMOND

## 2020-07-26 NOTE — Progress Notes (Signed)
Nutrition Follow-up  DOCUMENTATION CODES:   Not applicable  INTERVENTION:  Once PEG is ready for use, resume previous TF regimen: Glucerna 1.5 at 50 ml/h (1200 ml per day)  Provides 1800 kcal, 99 gm protein, 911 ml free water daily  NUTRITION DIAGNOSIS:   Inadequate oral intake related to inability to eat as evidenced by NPO status.  ongoing  GOAL:   Patient will meet greater than or equal to 90% of their needs  Met with TF  MONITOR:   TF tolerance,Diet advancement  REASON FOR ASSESSMENT:   Consult Enteral/tube feeding initiation and management,Assessment of nutrition requirement/status  ASSESSMENT:   Pt with PMH of HTN and DM who admitted with L sided weakness and slurred speech per CT pt with R CVA.  3/23 s/p tPA and IR; intubated 3/24 hemorrhagic transformation, RUE thrombectomy  3/25 s/p R crani with placement of bone flap in R abd 3/28 cortrak placed; tip gastric  3/29 off arctic sun; desat with PT 3/31 extubated  4/06 cortrak advanced after having been dislodged 4/07 cortrak removed due to TEE 4/08 cortrak replaced s/p failed MBS (tip gastric)  Per CM/SW, pt with bed offer at [facility]; now pending insurance authorization.    Pt unavailable at time of RD visit. Per RN, pt having PEG placed. Once ready for use, recommend continuing previous TF regimen (Glucerna 1.5 @ 1m/hr) as pt was tolerating well per RN.   Admit wt: 72.2 kg Current wt: 69.7 kg  UOP: [id number] hours  Medications: colace, SSI Q4H, novolog 6 units Q4H, 35 units levemir BID, mvi with minerals, miralax Labs reviewed. CBGs [id number] Order:   Diet Order            Diet NPO time specified Except for: Sips with Meds  Diet effective now                 EDUCATION NEEDS:   No education needs have been identified at this time  Skin:  Skin Assessment: Reviewed RN Assessment (incisions; venous stasis ulcer R hand)  Last BM:  4/13 type 6  Height:   Ht Readings  from Last 1 Encounters:  07/06/20 5' 5"  (1.651 m)    Weight:   Wt Readings from Last 1 Encounters:  07/23/20 69.7 kg    Ideal Body Weight:  56.8 kg  BMI:  Body mass index is 25.57 kg/m.  Estimated Nutritional Needs:   Kcal:  1800-2000  Protein:  90-110 grams  Fluid:  >1.8 L/day    AmLarkin InaMS, RD, LDN RD pager number and weekend/on-call pager number located in [facility]

## 2020-07-26 NOTE — TOC Progression Note (Signed)
Transition of Care [facility]) - Progression Note    Patient Details  Name: Brandi Myers MRN: [id number] Date of Birth: 1986-04-12  Transition of Care [facility]) CM/SW Contact  Baldemar Lenis, [place] Phone Number: 07/26/2020, 3:45 PM  Clinical Narrative:   CSW noting that patient has a bed offer at [facility]. CSW spoke with patient's aunt, Brandi Myers, to provide bed offer, and she is agreeable. CSW asked [place] to [employer] authorization. CSW to follow.    Expected Discharge Plan: Skilled Nursing Facility Barriers to Discharge: Continued Medical Work up,Insurance Authorization  Expected Discharge Plan and Services Expected Discharge Plan: Skilled Nursing Facility       Living arrangements for the past 2 months: Single Family Home                                       Social Determinants of Health (SDOH) Interventions    Readmission Risk Interventions No flowsheet data found.

## 2020-07-26 NOTE — Progress Notes (Addendum)
STROKE TEAM PROGRESS NOTE   INTERVAL HISTORY Patient underwent successful placement of OG tube today. Patient is alert to person, place, time, but asks for "the baby" and requires some redirection. Patient is otherwise resting comfortably in bed.  Notably melatonin was started last night because patient was reported to be having outburst at night and confusing her days and night. RN did not report outburst overnight today, but patient continues to sleep often during the day.   Vitals:   07/26/20 0940 07/26/20 1053 07/26/20 1106 07/26/20 1205  BP: 139/81 138/70 (!) 154/80 (!) 142/95  Pulse: (!) 109 (!) 103 97 97  Resp: 20 12 12 14   Temp: 97.9 F (36.6 C) 97.9 F (36.6 C)  98.3 F (36.8 C)  TempSrc: Temporal Temporal  Axillary  SpO2: 100% 98% 97% 97%  Weight:      Height:       CBC:  Recent Labs  Lab 07/25/20 0633 07/26/20 0429  WBC 11.1* 11.0*  HGB 9.6* 9.6*  HCT 34.6* 34.2*  MCV 78.1* 77.7*  PLT 830* 622*   Basic Metabolic Panel:  Recent Labs  Lab 07/24/20 0451 07/26/20 0253  NA 138 137  K 4.1 3.9  CL 100 100  CO2 28 27  GLUCOSE 116* 63*  BUN 8 7  CREATININE 0.53 0.48  CALCIUM 9.7 9.7    IMAGING past 24 hours No results found.  PHYSICAL EXAM General: caucasian female, lying in bed in [place] lower half of body towards the door attempting to get out of bed, alert, waving right leg in the air.   Lungs: Symmetrical Chest rise, no labored breathing  Cardio: Regular Rate and Rhythm  Abdomen: Soft, non-tender  Skin: R hand exposed today, cool to touch. Patient endorses pain in all of her fingers as well as pain to palpation of the darker tips of the thumb, and first two fingers. Patient also has pain to palpation of the eschar located on the thenar prominence.  Neuro -Alert oriented to person, place, and time. Ask to be handed a "baby." patient attention is poor but redirectable. Speech is understandable with some dysarthria. Comprehension is intact. Right  gaze preference stops at midline with inattention to the left.Left facial droop. Tongue midline.RUE and RLE spontaneous and purposeful movement against gravity (3/5) and LUE flaccid with splint applied and LLEexternally rotated.Sensation: Intact to noxious stimuli in the LLE although the extremity does not recoil, the patient tries to use her RLE to identify the pain on her LLE and obviously localized the pain. Patient does not sense noxious stimuli or light touch in the LUE. Extinction is positive, as patient does has preference to the right on light touch.   Strength: Bulk decreased in the BUE with some atrophy noted to the ventral arm. BLE bulk appears normal at this. 3/5 in RUE and RLE. Decreased tone in LLE and and LUE. External rotation. 0/5 in LLE and LUE.  ASSESSMENT/PLAN Ms.Kyleigha L Priceis a 35 y.o.femalewith history of uncontrolled diabetesandhypertensionpresenting with left sided weakness and slurred speech.Per family there is a complicated social situation at this time. She lives intermittently in an apartment by herself but was kicked out and they went to pick her up at 5:30 AM. At that point she seemed "off"and generally quiet but did not seem to have any difficulty with left-sided weakness.Family attributed this to psychosocial issues and potential substance use noting that her sister passed away from an overdose in October. Later in the morning she complained of diffuse body  pain and was administered gabapentin and a muscle relaxer.At 11:30 AM she stood up and walkedto the showerand at1215 she was found on the bottom of the tub with slurred speech and difficulty moving the left side. She was not preactivated by EMS, but on arrival in the ED clearly appear to have a doorway examination consistent with a right MCA territory infarct.CTA revealed right ICA occlusion as well as a right common carotid bifurcation thrombus extending into the external carotid (felt to be the  source of the occlusion). CT perfusion imaging demonstrated possible core infarct of 51 mL (though notably this was obtained within 2 hours of symptom onset and therefore may reflect an overestimate of the true core). Mismatch ratio was 2 with a penumbra of 63 mL. Case was discussed with Dr. Estanislado Pandy of neuro interventional radiology and Dr. Cheral Marker of [facility] and the patient was accepted for transfer to [facility] for emergent intervention. She was also started on TPA after consenting the patient's emergency contact with full discussion of risks of bleeding and importance of accurately determining the patient's last known well.Ms. Jeanann Lewandowsky confirm the last known well as described above and agreed that the patient would want TPA given the risk/benefit.She confirmed that the patient had no recent procedures or other contraindications to TPA.Regarding her baseline, she is functional at baseline and family believes she is smoking at [place] a day currently. She is on Metformin for her diabetes, does not take oral contraceptive pills, has had no recent surgeries or procedures. Patient PEG has been placed successfully. Patient PLt continues to decrease. At this time may consider patient be transferred to Eliquis 4/16 from Heparin IV as patient now has PEG tube. Will begin feedings and meds through tube at Surgeries discretion. Heparin level was elevated at >1.10 today. Placement continues to be difficult for patient.    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   CT no acute finding but suspicious for right MCA early ischemic changes.   CTA head and neck showed right ICA terminal and  proximal MCA occlusion, right CCA nonocclusive thrombus.  CT perfusion showed large penumbra.   Status post thrombectomy with right terminal ICA, right PCA and right ACA TICI2breperfusion however does have distal micro emboli.  MRI showed large right MCA infarct with 71m MLS and hemorrhagic transformation.   MRA showed patent intracranial vessels.  CT Head WO Contrast 3/26 620 - Improved midline shift and normalized left lateral ventricular volume after decompressive craniectomy. No progressive infarct or hemorrhage.  2D Echo- EF: 60-65%. No wall motion abnormality.  TCD Bubble - No PFO  TEE today no source of emboli, no PFO  DVT [place]-No evidence of DVT bilaterally  LDL99  HgbA1c10.6  VTE prophylaxis -heparin IV  No antithromboticprior to admission, now on heparin IV for right ulnar artery occlusion, will attempt to d'c tom 4/15 pharmacy managed  Therapy recommendations:CIR however no family support so SNF is working plan  Disposition:SNF due to lack of family support, will reconsult CIR as there has been difficulty in finding a SNF to accept patient due to age and + Cocaine on admission UDS  Cerebral edema, resolved  CT Headrepeat3/24 -Continued interval evolution of right MCA distribution infarct with associated hemorrhagic transformation. Worsened regional mass effect  and edema with increased right-to-left midline shift now measuring up to 9 mm.Increased dilatation of the right lateral ventricle concerning for developing ventricular trapping. No other new acute intracranial abnormality.  CT repeat 3/25 unchangedright cerebral cytotoxic edema with central hemorrhage. Midline shift is similar at 1 cm.Unchanged left lateral ventricle dilatation with evidence of transependymal flow.  S/pdecompressive hemicraniectomy (3/25)with Dr. Vertell Limber  CT Head WO Contrast 3/26 620 - Improved midline shift and normalized left lateral ventricular volume after  decompressive craniectomy. No progressive infarct or hemorrhage.  On keppra 500 mg bid  3% salineoff  Na 137->141->147->150>158>150> 141>139>140>139>138  Staple removal completed on 4/8  Thrombocytosis  Plt 915> 830> 775  CRP (2.9)and ESR (92)   Cardioliopin ab IgM elevated (15), B2-glycoprotein IgA elevated (31), ANA ab (-)  Continue heparin  IV feraheme weekly x2 per heme/onc recc's patient has no known allergies beyond Norco  Please see Heme/onc note, multiple test ordered  Fever, resolved Leukocytosis, improving  Tmax 101.7->102.5> 98.6> 98.7>99.9>afebrile  WBC -16.2>15.7>16.8>18.6->21.5->19.6->14.8>13.7>12.9>12.5>11.1>11.0  TEE no endocarditis  UA (4/3) neg  CXR4/4-left perihilar atelectasis  CXR 4/7 No acute findings   Abd Xray 4/7 No acute findings  Respiratory Culture (3/26) - Strep Pneumoniae  Blood CxNo growth after5days drawn 3/25  Completed 7 day course of Ceftriaxone on 4/2  ID on board, do not think endocarditis.  Tachycardia and tachypnea- improving Leukocytosis- improving  WBC -16.2>15.7>16.8>18.6->21.5->19.6->14.8>13.7>12.9>12.5>11.1>11  Likely related to ischemic right hand  on metoprolol 5m bid   UA: negative  LE venous doppler repeat (4/3): No evidence of deep vein thrombosis   R critical limb ischemia  S/p thrombectomy (3/24) for right brachial artery occlusion  Now Right hand pain, cool to touch, dry eschar wound on the palmar region, decreased dopplers per RN, and tachycardia, tachypnea with leukocytosis  UE arterial duplex (4/3) - right ulnar artery occlusion.   VVS Dr. CCarlis Abbott- No surgical intervention at this time. Recommends medical management with Heparin infusion.   Discussed with Dr. JRonnald Rampwith Neurosurgery ok'd the initiation of Heparin  Heparin infusion started on 4/3. Plan to transition to anticoagulation   Eliquis 4/16 as there is no discharge date at this time.  Percocet 5/325 q 6hr prn for  right hand pain  Delirium - Melatonin 372mQHS - Delirium precautions  Hyperlipidemia  Home meds:none  LDL 99, goal < 70  On lipitor 40 now  Continue statin at discharge  Poorly controlled Diabetes type II, with hyperglycemia   Home meds:Metformin, Glargine, Aspart  HgbA1c 10.6, goal < 7.0  CBGs  SSI  Levemir to 35 units bid  Novolog 6 units every 4 hours  Dysphagia  Feeding via cortrak  SLP following, NPOremains in place , patient failed swallow study today  S/p Cortrak 4/8 - was on TF @ 50 and IVF @ 25  Per trauma srugery re PEG: "meds now, tube feeds in 4h, keep binder on at all times to protect PEG"  Cocaine abuse  UDS positive for cocaine  Cessation education will be provided  Constipation- improving  On bowel regimen  BM 4/11  Sick thyroid- resolved  TSH: 0.307 (Low) Free T4:1.27 (high)  On metoprolol 25 bid  Repeat TSH and FT4 are WNL  IDA  Per Heme/onc recc's : IV Fereheme weekly x2 (1/2)  4/8 Iron level low at 21, Ferritin and TIBC wnl   Tobacco abuse  Current smoker  Smoking cessation counselingwill beprovided  Diabetes type II Uncontrolled  HgbA1c 10.6, goal < 7.0  CBGs  SSI  Other Stroke Risk Factors  Substance abuse - UDS: THC POSITIVEWill advise patientto stop using due to stroke risk.  Thrombocytosislikely reactive as platelet counts were normal on admission   FH of stroke at early age, per [facility] day # 22  Damita Dunnings, MD PGY-1  ATTENDING NOTE: I reviewed above note and agree with the assessment and plan. Pt was seen and examined.   During rounds, patient just came back from PEG tube placement.  She was laying in bed, awake alert, much interactive than before, able to answer questions, mild to moderate dysarthria, follows some commands.  Still has right hand ischemic changes but seems less painful than before.  Neuro no significant change as before.  Had IV iron  infusion and thrombocytosis improving.  Hemoglobin stable.  We will continue monitor CBC and BMP.  We will start tube feeding tonight.  Once stable, may consider transition from heparin IV to p.o. anticoagulation.  For detailed assessment and plan, please refer to above as I have made changes wherever appropriate.   Rosalin Hawking, MD PhD Stroke Neurology 07/26/2020 9:25 PM    To contact Stroke Continuity provider, please refer to [email or web address] After hours, contact General Neurology

## 2020-07-26 NOTE — Progress Notes (Signed)
ANTICOAGULATION CONSULT NOTE - Follow Up Consult  Pharmacy Consult for Heparin Indication: RUE occlusion  Allergies  Allergen Reactions  . Norco [Hydrocodone-Acetaminophen] Itching, Nausea And Vomiting and Swelling    Patient Measurements: Height: 5\' 5"  (165.1 cm) Weight: 69.7 kg (153 lb 10.6 oz) IBW/kg (Calculated) : 57  Vital Signs: Temp: 97.9 F (36.6 C) (04/13 2324) Temp Source: Oral (04/13 2324) BP: 128/92 (04/13 2324) Pulse Rate: 89 (04/13 2324)  Labs: Recent Labs    07/24/20 0451 07/25/20 0633 07/25/20 1000 07/26/20 0253  HGB 10.0* 9.6*  --   --   HCT 35.9* 34.6*  --   --   PLT 925* 830*  --   --   HEPARINUNFRC 0.22* 0.85* 0.63 >1.10*  CREATININE 0.53  --   --   --     Estimated Creatinine Clearance: 96.2 mL/min (by C-G formula based on SCr of 0.53 mg/dL).  Assessment: 35 y.o. female with RUE occlusion s/p thrombectomy continues on heparin. Heparin level >1.1 this morning - RN states that it looks like lab drew near where heparin running so likely inaccurate. Heparin stopping right now for PEG tube placement.  Goal of Therapy:  Heparin level 0.3-0.5 units/mL Monitor platelets by anticoagulation protocol: Yes   Plan:  Heparin off for PEG tube placement 4/14 F/u [facility] plans post procedure.  [facility], PharmD, BCPS Please see amion for complete clinical pharmacist phone list 07/26/2020 4:13 AM

## 2020-07-26 NOTE — Plan of Care (Signed)

## 2020-07-26 NOTE — Progress Notes (Signed)
ANTICOAGULATION CONSULT NOTE - Follow Up Consult  Pharmacy Consult for Heparin Indication: RUE occlusion  Allergies  Allergen Reactions  . Norco [Hydrocodone-Acetaminophen] Itching, Nausea And Vomiting and Swelling    Patient Measurements: Height: 5\' 5"  (165.1 cm) Weight: 69.7 kg (153 lb 10.6 oz) IBW/kg (Calculated) : 57  Vital Signs: Temp: 98.3 F (36.8 C) (04/14 1205) Temp Source: Axillary (04/14 1205) BP: 142/95 (04/14 1205) Pulse Rate: 97 (04/14 1205)  Labs: Recent Labs    07/24/20 0451 07/25/20 0633 07/25/20 1000 07/26/20 0253 07/26/20 0429  HGB 10.0* 9.6*  --   --  9.6*  HCT 35.9* 34.6*  --   --  34.2*  PLT 925* 830*  --   --  775*  HEPARINUNFRC 0.22* 0.85* 0.63 >1.10*  --   CREATININE 0.53  --   --  0.48  --     Estimated Creatinine Clearance: 96.2 mL/min (by C-G formula based on SCr of 0.48 mg/dL).  Assessment: 35 y.o. female with RUE occlusion s/p thrombectomy continues on heparin.  S/p PEG tube placement today Heparin to resume this evening at 6 pm  Goal of Therapy:  Heparin level 0.3-0.5 units/mL Monitor platelets by anticoagulation protocol: Yes   Plan:  Heparin at 2550 units / hr starting at 6 pm Daily heparin level, CBC  Thank you 31, PharmD 07/26/2020 1:03 PM

## 2020-07-26 NOTE — Op Note (Signed)
[facility] Patient Name: Brandi Myers Procedure Date : 07/26/2020 MRN: [id number] Attending MD: Georganna Skeans , MD Date of Birth: [birth year] CSN: [id number] Age: 35 Admit Type: Inpatient Procedure:                Upper GI endoscopy Indications:              Place PEG because patient is unable to eat due to                            stroke (CVA) Providers:                Georganna Skeans, MD, Margie Billet, PA-C, Clyde Lundborg, RN, Laverda Sorenson, Technician, Lance Coon, CRNA Referring MD:              Medicines:                Monitored Anesthesia Care Complications:             Estimated Blood Loss:     Estimated blood loss was minimal. Procedure:                Pre-Anesthesia Assessment:                           - Prior to the procedure, a History and Physical                            was performed, and patient medications and                            allergies were reviewed. The patient is unable to                            give consent secondary to the patient's altered                            mental status. The risks and benefits of the                            procedure and the sedation options and risks were                            discussed with the patient's relative. All                            questions were answered and informed consent was                            obtained. Patient identification and proposed                            procedure were  verified by the physician, the                            anesthetist and the technician in the procedure                            room. Mental Status Examination: alert but                            confused. ASA Grade Assessment: III - A patient                            with severe systemic disease. After reviewing the                            risks and benefits, the patient was deemed in                             satisfactory condition to undergo the procedure.                            The anesthesia plan was to use monitored anesthesia                            care (MAC). Immediately prior to administration of                            medications, the patient was re-assessed for                            adequacy to receive sedatives. The heart rate,                            respiratory rate, oxygen saturations, blood                            pressure, adequacy of pulmonary ventilation, and                            response to care were monitored throughout the                            procedure. The physical status of the patient was                            re-assessed after the procedure.                           After obtaining informed consent, the endoscope was                            passed under direct vision. Throughout the  procedure, the patient's blood pressure, pulse, and                            oxygen saturations were monitored continuously. The                            GIF-H190 ([id number]) Olympus gastroscope was                            introduced through the mouth, and advanced to the                            second part of duodenum. The upper GI endoscopy was                            accomplished without difficulty. The patient                            tolerated the procedure well. Scope In: Scope Out: Findings:      No gross lesions were noted in the esophagus.      A few less than 5 mm erosions with no bleeding and no stigmata of recent       bleeding were found in the stomach. Placement of an externally removable       PEG with no T-fasteners was successfully completed. The external bumper       was at the 4.0 cm marking on the tube.      No gross lesions were noted in the second portion of the duodenum. Impression:               - No gross lesions in esophagus.                           - Erosive gastropathy with no  bleeding and no                            stigmata of recent bleeding.                           - No gross lesions in the second portion of the                            duodenum.                           - An externally removable PEG placement was                            successfully completed.                           - No specimens collected. Recommendation:           meds now, tube feeds in 4h, keep binder on at all  times to protect PEG Procedure Code(s):        --- Professional ---                           [phone], Esophagogastroduodenoscopy, flexible,                            transoral; with directed placement of percutaneous                            gastrostomy tube Diagnosis Code(s):        --- Professional ---                           K31.89, Other diseases of stomach and duodenum                           I69.398, Other sequelae of cerebral infarction                           R63.3, Feeding difficulties                           Z43.1, Encounter for attention to gastrostomy CPT copyright 2019 [employer]. All rights reserved. The codes documented in this report are preliminary and upon coder review may  be revised to meet current compliance requirements. Georganna Skeans, MD 07/26/2020 11:05:11 AM This report has been signed electronically. Number of Addenda: 0

## 2020-07-26 NOTE — Progress Notes (Signed)
Patient ID: Brandi Myers, female   DOB: 1985-08-14, 35 y.o.   MRN: [id number] Day of Surgery   Subjective: In endo ROS negative except as listed above. Objective: Vital signs in last 24 hours: Temp:  [97.6 F (36.4 C)-98.5 F (36.9 C)] 97.9 F (36.6 C) (04/14 0940) Pulse Rate:  [89-113] 109 (04/14 0940) Resp:  [16-20] 20 (04/14 0940) BP: (115-139)/(78-92) 139/81 (04/14 0940) SpO2:  [96 %-100 %] 100 % (04/14 0940) Last BM Date: 07/25/20  Intake/Output from previous day: 04/13 0701 - 04/14 0700 In: 1346.1 [P.O.:180; I.V.:1136.1; NG/GT:30] Out: 1050 [Urine:1050] Intake/Output this shift: Total I/O In: -  Out: 20 [Urine:20]  General appearance: no distress Head: scalp incision Back: nt Cardio: regular rate and rhythm GI: soft, NT Neurologic: Mental status: arouses and does follow some commands  Lab Results: CBC  Recent Labs    07/25/20 0633 07/26/20 0429  WBC 11.1* 11.0*  HGB 9.6* 9.6*  HCT 34.6* 34.2*  PLT 830* 775*   BMET Recent Labs    07/24/20 0451 07/26/20 0253  NA 138 137  K 4.1 3.9  CL 100 100  CO2 28 27  GLUCOSE 116* 63*  BUN 8 7  CREATININE 0.53 0.48  CALCIUM 9.7 9.7   PT/INR No results for input(s): LABPROT, INR in the last 72 hours. ABG No results for input(s): PHART, HCO3 in the last 72 hours.  Invalid input(s): PCO2, PO2  Studies/Results: No results found.  Anti-infectives: Anti-infectives (From admission, onward)   Start     Dose/Rate Route Frequency Ordered Stop   07/08/20 0900  cefTRIAXone (ROCEPHIN) 2 g in sodium chloride 0.9 % 100 mL IVPB        2 g 200 mL/hr over 30 Minutes Intravenous Every 24 hours 07/08/20 0801 07/14/20 1132   07/08/20 0900  vancomycin (VANCOCIN) 1,750 mg in sodium chloride 0.9 % 500 mL IVPB        1,750 mg 250 mL/hr over 120 Minutes Intravenous  Once 07/08/20 0802 07/08/20 1149   07/06/20 2000  ceFAZolin (ANCEF) IVPB 1 g/50 mL premix        1 g 100 mL/hr over 30 Minutes Intravenous Every 8 hours  07/06/20 1400 07/07/20 0508   07/06/20 0945  ceFAZolin (ANCEF) IVPB 2g/100 mL premix        2 g 200 mL/hr over 30 Minutes Intravenous To Surgery 07/06/20 0855 07/07/20 0945   07/04/20 1541  ceFAZolin (ANCEF) 2-4 GM/100ML-% IVPB       Note to Pharmacy: Kathrynn Speed   : cabinet override      07/04/20 1541 07/05/20 0344      Assessment/Plan: CVA Dysphagia and malnutrition - for PEG placement today. Consent obtained and documented from her aunt.  LOS: 22 days    Violeta Gelinas, MD, MPH, FACS Trauma & General Surgery Use [email or web address] to contact on call provider  07/26/2020

## 2020-07-27 ENCOUNTER — Inpatient Hospital Stay (HOSPITAL_COMMUNITY): Payer: Medicaid Other

## 2020-07-27 ENCOUNTER — Encounter (HOSPITAL_COMMUNITY): Payer: Self-pay | Admitting: General Surgery

## 2020-07-27 DIAGNOSIS — I6601 Occlusion and stenosis of right middle cerebral artery: Secondary | ICD-10-CM | POA: Diagnosis not present

## 2020-07-27 DIAGNOSIS — D72829 Elevated white blood cell count, unspecified: Secondary | ICD-10-CM | POA: Diagnosis not present

## 2020-07-27 DIAGNOSIS — R509 Fever, unspecified: Secondary | ICD-10-CM | POA: Diagnosis not present

## 2020-07-27 DIAGNOSIS — I749 Embolism and thrombosis of unspecified artery: Secondary | ICD-10-CM | POA: Diagnosis not present

## 2020-07-27 LAB — BASIC METABOLIC PANEL
Anion gap: 15 (ref 5–15)
BUN: 8 mg/dL (ref 6–20)
CO2: 24 mmol/L (ref 22–32)
Calcium: 9.9 mg/dL (ref 8.9–10.3)
Chloride: 98 mmol/L (ref 98–111)
Creatinine, Ser: 0.57 mg/dL (ref 0.44–1.00)
GFR, Estimated: >60 mL/min (ref >60)
Glucose, Bld: 190 mg/dL — ABNORMAL HIGH (ref 70–99)
Potassium: 4.4 mmol/L (ref 3.5–5.1)
Sodium: 137 mmol/L (ref 135–145)

## 2020-07-27 LAB — HEPARIN LEVEL (UNFRACTIONATED): Heparin Unfractionated: 0.51 IU/mL (ref 0.30–0.70)

## 2020-07-27 LAB — GLUCOSE, CAPILLARY
Glucose-Capillary: 200 mg/dL — ABNORMAL HIGH (ref 70–99)
Glucose-Capillary: 217 mg/dL — ABNORMAL HIGH (ref 70–99)
Glucose-Capillary: 277 mg/dL — ABNORMAL HIGH (ref 70–99)
Glucose-Capillary: 207 mg/dL — ABNORMAL HIGH (ref 70–99)
Glucose-Capillary: 165 mg/dL — ABNORMAL HIGH (ref 70–99)

## 2020-07-27 LAB — CBC
HCT: 36.3 % (ref 36.0–46.0)
Hemoglobin: 10.1 g/dL — ABNORMAL LOW (ref 12.0–15.0)
MCH: 21.9 pg — ABNORMAL LOW (ref 26.0–34.0)
MCHC: 27.8 g/dL — ABNORMAL LOW (ref 30.0–36.0)
MCV: 78.7 fL — ABNORMAL LOW (ref 80.0–100.0)
Platelets: 742 10*3/uL — ABNORMAL HIGH (ref 150–400)
RBC: 4.61 MIL/uL (ref 3.87–5.11)
RDW: 25.1 % — ABNORMAL HIGH (ref 11.5–15.5)
WBC: 21.4 10*3/uL — ABNORMAL HIGH (ref 4.0–10.5)
nRBC: 0 % (ref 0.0–0.2)

## 2020-07-27 LAB — FACTOR 5 LEIDEN

## 2020-07-27 LAB — TROPONIN I (HIGH SENSITIVITY)
Troponin I (High Sensitivity): 4 ng/L (ref <18)
Troponin I (High Sensitivity): 4 ng/L (ref <18)

## 2020-07-27 MED ORDER — APIXABAN 5 MG PO TABS
5.0000 mg | ORAL_TABLET | Freq: Two times a day (BID) | ORAL | Status: DC
  Administered 2020-07-27 – 2020-08-03 (×15): 5 mg
  Filled 2020-07-27 (×15): qty 1

## 2020-07-27 NOTE — Progress Notes (Addendum)
STROKE TEAM PROGRESS NOTE   INTERVAL HISTORY Patient is awake and calling out for help this AM. Patient reports that she wants help in order to get up out of bed. RN notes report that patient is no longer having nighttime outburst, but she did complain of having chest pain, but points to her PEG. This AM patient reports that she has chest pain on exam and points to the left, medial area of her chest. Patient reports that the pain is "sharp." Patient was unable to clarify if the pain is radiating.  Patient has begun receiving food through her PEG.   Vitals:   07/26/20 2328 07/27/20 0333 07/27/20 0527 07/27/20 0801  BP: 118/86 112/83  115/83  Pulse: (!) 106 (!) 109  93  Resp: 18 18  20   Temp: 98.1 F (36.7 C) 98.8 F (37.1 C)  99.1 F (37.3 C)  TempSrc: Oral Oral  Oral  SpO2: 93% 98%  91%  Weight:   72.8 kg   Height:       CBC:  Recent Labs  Lab 07/26/20 0429 07/27/20 0225  WBC 11.0* 21.4*  HGB 9.6* 10.1*  HCT 34.2* 36.3  MCV 77.7* 78.7*  PLT 775* 527*   Basic Metabolic Panel:  Recent Labs  Lab 07/26/20 0253 07/27/20 0225  NA 137 137  K 3.9 4.4  CL 100 98  CO2 27 24  GLUCOSE 63* 190*  BUN 7 8  CREATININE 0.48 0.57  CALCIUM 9.7 9.9    IMAGING past 24 hours No results found.  PHYSICAL EXAM General: caucasian female, lying in bed with R leg hanging over the rail  Lungs: Symmetrical Chest rise, no labored breathing  Cardio: Regular Rate and Rhythm  Abdomen: Soft, non-tender  Skin:R hand exposed today, cool to touch. Continues to have darker tips of the thumb, and first two fingers. Patient also has pain to palpation of the eschar located on the thenar prominence.  PEG site looks appropriate color with no erythema.  Neuro -Alert oriented to person and time. Patient reports that she is in "[employer]" and also says something about food stamps. Patient reports that she does recognize the Neurology team and believes that we are her nurses, but continues  to believe she is not in a hospital. Speech is understandable with some dysarthria. Comprehension is intact.Patient continues to have right gaze preference but able to cross midline today.Left facial droop. Tongue midline.RUE 4+/5 and RLE purposeful movement against gravity (3/5) and LUE flaccid/ atonic with external rotation and with splint applied and LLEexternally rotated.Sensation: LLE and RLE not intact to light touch or noxious stimuli. Extinction is positive, as patient does has preference to the right on light touch.    ASSESSMENT/PLAN Ms.An L Priceis a 35 y.o.femalewith history of uncontrolled diabetesandhypertensionpresenting with left sided weakness and slurred speech.Per family there is a complicated social situation at this time. She lives intermittently in an apartment by herself but was kicked out and they went to pick her up at 5:30 AM. At that point she seemed "off"and generally quiet but did not seem to have any difficulty with left-sided weakness.Family attributed this to psychosocial issues and potential substance use noting that her sister passed away from an overdose in October. Later in the morning she complained of diffuse body pain and was administered gabapentin and a muscle relaxer.At 11:30 AM she stood up and walkedto the showerand at1215 she was found on the bottom of the tub with slurred speech and difficulty moving the left  side. She was not preactivated by EMS, but on arrival in the ED clearly appear to have a doorway examination consistent with a right MCA territory infarct.CTA revealed right ICA occlusion as well as a right common carotid bifurcation thrombus extending into the external carotid (felt to be the source of the occlusion). CT perfusion imaging demonstrated possible core infarct of 51 mL (though notably this was obtained within 2 hours of symptom onset and therefore may reflect an overestimate of the true core). Mismatch ratio was 2 with  a penumbra of 63 mL. Case was discussed with Dr. Estanislado Pandy of neuro interventional radiology and Dr. Cheral Marker of [facility] and the patient was accepted for transfer to [facility] for emergent intervention. She was also started on TPA after consenting the patient's emergency contact with full discussion of risks of bleeding and importance of accurately determining the patient's last known well.Ms. Jeanann Lewandowsky confirm the last known well as described above and agreed that the patient would want TPA given the risk/benefit.She confirmed that the patient had no recent procedures or other contraindications to TPA.Regarding her baseline, she is functional at baseline and family believes she is smoking at [place] a day currently. She is on Metformin for her diabetes, does not take oral contraceptive pills, has had no recent surgeries or procedures. Patient PEG has been placed successfully; however patient leukocytosis worsened overnight to 21.4. This may be a reaction to recent surgery will continue to monitor. Test orderd by Heme/onc continue to indicate that the patient is responding to chronic inflammation likely reaction to prior procedures. On neurologic exam patient was able to cross midline today for the first time. Unfortunately patient continues to have complete neglect of the L arm and it is externally rotated in the splint. Patient did not have movement in her Left leg but she has had varying degrees of recognition to the LLE throughout hospitalization. Patient continued to report chest pain this AM and was able to specifically point to her chest this AM. EKG was ordered but was not concerning for ACS. Troponin levels were also ordered but remain pending. Patient PEG appears to be functioning well will transition patient from IV heparin to Eliquis.   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   CT no acute finding but suspicious for right MCA early ischemic changes.   CTA head and neck showed right ICA terminal and proximal MCA occlusion, right CCA nonocclusive thrombus.  CT perfusion showed large penumbra.   Status post thrombectomy with right terminal ICA, right PCA and right ACA TICI2breperfusion however does have distal micro emboli.  MRI showed large right MCA infarct with 2m MLS and hemorrhagic transformation.   MRA showed patent intracranial vessels.  CT Head WO Contrast 3/26 620 - Improved midline shift and normalized left lateral ventricular volume after decompressive craniectomy. No progressive infarct or hemorrhage.  2D Echo- EF: 60-65%. No wall motion abnormality.  TCD Bubble - No PFO  TEE today no source of emboli, no PFO  DVT [place]-No evidence of DVT bilaterally  LDL99  HgbA1c10.6  VTE prophylaxis -heparin IV  No antithromboticprior to admission, now on heparin IV for right ulnar artery occlusion, will attempt to transition to Eliquis, pharmacy managed  Therapy recommendations:CIR however no family support so  SNF is working plan  Disposition:SNF due to lack of family support, will reconsult CIR as there has been difficulty in finding a SNF to accept patient due to ageand + Cocaine on admission UDS  Cerebral edema, resolved  CT Headrepeat3/24 -Continued interval evolution of right MCA distribution infarct with associated hemorrhagic transformation. Worsened regional mass effect and edema with increased right-to-left midline shift now measuring up to 9 mm.Increased dilatation of the right lateral ventricle concerning for developing ventricular trapping. No other new acute intracranial abnormality.  CT repeat 3/25  unchangedright cerebral cytotoxic edema with central hemorrhage. Midline shift is similar at 1 cm.Unchanged left lateral ventricle dilatation with evidence of transependymal flow.  S/pdecompressive hemicraniectomy (3/25)with Dr. Vertell Limber  CT Head WO Contrast 3/26 620 - Improved midline shift and normalized left lateral ventricular volume after decompressive craniectomy. No progressive infarct or hemorrhage.  On keppra 500 mg bid, consulted NSG about discontinuing  Na 137->141->147->150>158>150> 141>139>140>139>138>137  Staple removal completed on 4/8  Thrombocytosis  Plt 915> 830> 775> 742  CRP (2.9)and ESR (92)   Cardioliopin ab IgM elevated (15), B2-glycoprotein IgA elevated (31), ANA ab (-)  Transition Heparin to Eliquis  IV feraheme weekly x2 per heme/onc recc's patient has no known allergies beyond Norco  Fever, Leukocytosis,   Tmax 101.7->102.5> 98.6> 98.7>99.9>afebrile>>>100.8 (4/14)  WBC -16.2>15.7>16.8>18.6->21.5->19.6->14.8>13.7>12.9>12.5>11.1>11.0>21.4  TEE no endocarditis  UA (4/3) neg  CXR4/4-left perihilar atelectasis  CXR 4/7 No acute findings   Abd Xray 4/7 No acute findings  Respiratory Culture (3/26) - Strep Pneumoniae  Blood CxNo growth after5days drawn 3/25  Completed 7 day course of Ceftriaxone on 4/2  ID followed patient did not think endocarditis.  New fever post-op : F/U UA, CXR, Bcx  Tachycardia and tachypnea- improving Leukocytosis  WBC -16.2>15.7>16.8>18.6->21.5->19.6->14.8>13.7>12.9>12.5>11.1>11>21.4  Likely related to ischemic right hand  on metoprolol 44m bid   UA: negative  LE venous doppler repeat (4/3): No evidence of deep vein thrombosis   Recent EKG sinus tachycardia   Acute onset Chest Pain - EKG: Sinus tachy cardia - Troponin x 2 pending - CXR - May consider consult to IM  R critical limb ischemia  S/p thrombectomy (3/24) for right brachial artery occlusion  Now Right hand pain, cool to  touch, dry eschar wound on the palmar region, decreased dopplers per RN, and tachycardia, tachypnea with leukocytosis  UE arterial duplex (4/3) - right ulnar artery occlusion.   VVS Dr. CCarlis Abbott- No surgical intervention at this time. Recommends medical management with Heparin infusion.   Discussed with Dr. JRonnald Rampwith Neurosurgery ok'd the initiation of Heparin  Heparin infusion started on 4/3. Plan to transition to anticoagulation   Eliquis 4/16 as there is no discharge date at this time.  Heparin level 0.51 , 4/15  Percocet 5/325 q 6hr prn for right hand pain  Delirium - Melatonin 367mQHS - Delirium precautions  Hyperlipidemia  Home meds:none  LDL 99, goal < 70  On lipitor 40 now  Continue statin at discharge  Poorly controlled Diabetes type II, with hyperglycemia   Home meds:Metformin, Glargine, Aspart  HgbA1c 10.6, goal < 7.0  CBGs  SSI  Levemir to 35 units bid  Novolog 6 units every 4 hours  Dysphagia  Feeding via cortrak  SLP following, NPOremains in place , patient failed swallow study today  S/p Cortrak 4/8 - was on TF @ 50 and IVF @ 25  Per trauma srugery re PEG: "meds now, tube feeds in 4h, keep binder on at alltimes to protect PEG"  Cocaine abuse  UDS positive for cocaine  Cessation education will be provided  Constipation- improving  On bowel regimen  BM 4/11  Sick thyroid- resolved  TSH: 0.307 (Low) Free T4:1.27 (high)  On metoprolol 25 bid  Repeat TSH and FT4 are WNL  IDA  Per Heme/onc recc's : IV Fereheme weekly x2(1/2)  4/8 Iron level low at 21, Ferritin and TIBC wnl   Tobacco abuse  Current smoker  Smoking cessation counselingwill beprovided  Other Stroke Risk Factors Active Problems  Substance abuse - UDS: THC POSITIVEWill advise patientto stop using due to stroke risk.  Thrombocytosislikely reactive as platelet counts were normal on admission   FH of stroke at early age, per [facility] day # 29  Damita Dunnings, MD PGY-1  ATTENDING NOTE: I reviewed above note and agree with the assessment and plan. Pt was seen and examined.   Pt lying in bed, neuro stable, able to have left gaze now although still has right gaze preference. Still weak grip on the right hand, still has left hemiplegia and hemiparesthesia. Case manager stated that pt had a bed offer in SNF. Will need go through insurance now. Heparin IV switched to eliquis today. On TF and tolerating well from PEG. Discussed with neurosurgery, will d/c keppra. However, pt had spiking fever, 100.8, and WBC from 11.0 shot up to 21.4 today, concerning for infection. Will do UA, CXR and blood culture. Will continue to monitor. Will call internal medicine re-consult if needed.   Rosalin Hawking, MD PhD Stroke Neurology 07/27/2020 7:17 PM  Patient condition worsened within the last 24 hours, has developed fever, continues to have left hemiplegia, and I have ordered infectious work up. I spent  35 minutes in total face-to-face time with the patient, more than 50% of which was spent in counseling and coordination of care, reviewing test results, images and medication, and discussing the diagnosis, treatment plan and potential prognosis. This patient's care requiresreview of multiple databases, neurological assessment, discussion with other specialists and medical decision making of high complexity.   To contact Stroke Continuity provider, please refer to [email or web address] After hours, contact General Neurology

## 2020-07-27 NOTE — TOC Progression Note (Addendum)
Transition of Care [name]) - Progression Note    Patient Details  Name: Brandi Myers MRN: [id number] Date of Birth: [birth year]  Transition [facility]) CM/SW Contact  Carley Hammed, [place] Phone Number: 07/27/2020, 11:12 AM  Clinical Narrative:    CSW left a VM for [facility], with a request for a call back to follow up on this pt's insurance authorization. CSW will continue to follow.  1:25 pm CSW was requested to contact pt's family to discuss DC plans. VM was left for father. Aunt noted that father was currently in the hospital, so she is the contact. She requested the address for the accepting facility be texted to her, this was done. She was advised that CSW left messages for CP today, but had not heard anything back about insurance auth. She noted understanding. SW will continue to follow.    Expected Discharge Plan: Skilled Nursing Facility Barriers to Discharge: Continued Medical Work up,Insurance Authorization  Expected Discharge Plan and Services Expected Discharge Plan: Skilled Nursing Facility       Living arrangements for the past 2 months: Single Family Home                                       Social Determinants of Health (SDOH) Interventions    Readmission Risk Interventions No flowsheet data found.

## 2020-07-27 NOTE — Progress Notes (Signed)
Patient slept well and no events overnight. Tube feeding via PEG tube has been infusing without any difficulties. Last night, patient complained chest pain but she was pointing the PEG tube site. PEG tube site is C/D/I. Tylenol given. Dr. Roda Shutters is aware. Will continue to assess.
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
## 2020-07-27 NOTE — Progress Notes (Signed)
   07/27/20 1201  Assess: MEWS Score  Temp (!) 100.8 F (38.2 C)  BP 105/76  Pulse Rate (!) 110  Resp 20  SpO2 95 %  O2 Device Room Air  Assess: MEWS Score  MEWS Temp 1  MEWS Systolic 0  MEWS Pulse 1  MEWS RR 0  MEWS LOC 0  MEWS Score 2  MEWS Score Color Yellow  Assess: if the MEWS score is Yellow or Red  Were vital signs taken at a resting state? Yes  Focused Assessment Change from prior assessment (see assessment flowsheet)  MEWS guidelines implemented *See Row Information* No, other (Comment)  Treat  MEWS Interventions Administered prn meds/treatments  Pain Scale 0-10  Pain Score 2  Faces Pain Scale 2  Take Vital Signs  Increase Vital Sign Frequency  Yellow: Q 2hr X 2 then Q 4hr X 2, if remains yellow, continue Q 4hrs  Escalate  MEWS: Escalate Yellow: discuss with charge nurse/RN and consider discussing with provider and RRT  Notify: Charge Nurse/RN  Name of Charge Nurse/RN Notified Loura Pardon  Date Charge Nurse/RN Notified 07/27/20  Notify: Provider  Provider Name/Title Xu MD  Date Provider Notified 07/27/20  Time Provider Notified 1224  Notification Type Page  Document  Progress note created (see row info) Yes

## 2020-07-27 NOTE — Progress Notes (Signed)
Progress Note: General Surgery Service   Chief Complaint/Subjective: PEG tube at 4.0 at bumper, tube feeds running at 50 mL.  Tube functioning fine.  Patient complaining of chest pain - being medicated.  Objective: Vital signs in last 24 hours: Temp:  [97.9 F (36.6 C)-99.1 F (37.3 C)] 99.1 F (37.3 C) (04/15 0801) Pulse Rate:  [93-109] 93 (04/15 0801) Resp:  [12-20] 20 (04/15 0801) BP: (112-154)/(70-95) 115/83 (04/15 0801) SpO2:  [91 %-100 %] 91 % (04/15 0801) Weight:  [72.8 kg] 72.8 kg (04/15 0527) Last BM Date: 07/27/20  Intake/Output from previous day: 04/14 0701 - 04/15 0700 In: 1207.4 [I.V.:522.9; NG/GT:684.5] Out: 470 [Urine:470] Intake/Output this shift: No intake/output data recorded.   GI: Abd PEG at 4.0 at bumper, site c/d/i w/ dressing, TF flowing freely  Lab Results: CBC  Recent Labs    07/26/20 0429 07/27/20 0225  WBC 11.0* 21.4*  HGB 9.6* 10.1*  HCT 34.2* 36.3  PLT 775* 742*   BMET Recent Labs    07/26/20 0253 07/27/20 0225  NA 137 137  K 3.9 4.4  CL 100 98  CO2 27 24  GLUCOSE 63* 190*  BUN 7 8  CREATININE 0.48 0.57  CALCIUM 9.7 9.9   PT/INR No results for input(s): LABPROT, INR in the last 72 hours. ABG No results for input(s): PHART, HCO3 in the last 72 hours.  Invalid input(s): PCO2, PO2  Anti-infectives: Anti-infectives (From admission, onward)   Start     Dose/Rate Route Frequency Ordered Stop   07/08/20 0900  cefTRIAXone (ROCEPHIN) 2 g in sodium chloride 0.9 % 100 mL IVPB        2 g 200 mL/hr over 30 Minutes Intravenous Every 24 hours 07/08/20 0801 07/14/20 1132   07/08/20 0900  vancomycin (VANCOCIN) 1,750 mg in sodium chloride 0.9 % 500 mL IVPB        1,750 mg 250 mL/hr over 120 Minutes Intravenous  Once 07/08/20 0802 07/08/20 1149   07/06/20 2000  ceFAZolin (ANCEF) IVPB 1 g/50 mL premix        1 g 100 mL/hr over 30 Minutes Intravenous Every 8 hours 07/06/20 1400 07/07/20 0508   07/06/20 0945  ceFAZolin (ANCEF) IVPB 2g/100  mL premix        2 g 200 mL/hr over 30 Minutes Intravenous To Surgery 07/06/20 0855 07/07/20 0945   07/04/20 1541  ceFAZolin (ANCEF) 2-4 GM/100ML-% IVPB       Note to Pharmacy: Desiree Hane   : cabinet override      07/04/20 1541 07/05/20 0344      Medications: Scheduled Meds: . atorvastatin  40 mg Per Tube q1800  . chlorhexidine gluconate (MEDLINE KIT)  15 mL Mouth Rinse BID  . Chlorhexidine Gluconate Cloth  6 each Topical Daily  . docusate  100 mg Per Tube BID  . gabapentin  600 mg Per Tube Q8H  . insulin aspart  0-15 Units Subcutaneous Q4H  . insulin aspart  6 Units Subcutaneous Q4H  . insulin detemir  35 Units Subcutaneous BID  . levETIRAcetam  500 mg Per Tube BID  . mouth rinse  15 mL Mouth Rinse q12n4p  . melatonin  3 mg Oral QHS  . metoprolol tartrate  50 mg Per Tube BID  . multivitamin with minerals  1 tablet Per Tube Daily  . polyethylene glycol  17 g Per Tube Daily  . sodium chloride flush  10-40 mL Intracatheter Q12H   Continuous Infusions: . sodium chloride Stopped (07/13/20 1028)  .  sodium chloride Stopped (07/26/20 1046)  . feeding supplement (GLUCERNA 1.5 CAL) 50 mL/hr at 07/27/20 0600  . ferumoxytol Stopped (07/24/20 2152)  . heparin 2,550 Units/hr (07/27/20 0600)   PRN Meds:.sodium chloride, acetaminophen **OR** acetaminophen (TYLENOL) oral liquid 160 mg/5 mL **OR** acetaminophen, bisacodyl, labetalol, ondansetron **OR** ondansetron (ZOFRAN) IV, senna-docusate  Assessment/Plan: s/p Procedure(s): ESOPHAGOGASTRODUODENOSCOPY (EGD) WITH PROPOFOL PERCUTANEOUS ENDOSCOPIC GASTROSTOMY (PEG) PLACEMENT 07/26/2020  PEG still at 4.0 at bumper, appropriate position Tube feeds flowing without issues General surgery team will sign off, please call with issues   LOS: 23 days    Felicie Morn, MD  [facility] Use [email or web address] to contact on call provider
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
## 2020-07-27 NOTE — Progress Notes (Signed)
SLP Cancellation Note  Patient Details Name: Brandi Myers MRN: [id number] DOB: 1985/08/25   Cancelled treatment:       Reason Eval/Treat Not Completed: Fatigue/lethargy limiting ability to participate; attempted x2 with pt not responding despite sternal rub and other attempts to awaken; nursing reported fever earlier in shift.  ST will continue efforts; pt has PEG in place for nutrition, but asking for food/drink per nursing report.   Tressie Stalker, M.S., CCC-SLP 07/27/2020, 2:29 PM

## 2020-07-27 NOTE — Progress Notes (Addendum)
ANTICOAGULATION CONSULT NOTE - Follow Up Consult  Pharmacy Consult for Heparin to Eliquis Indication: RUE occlusion  Allergies  Allergen Reactions  . Norco [Hydrocodone-Acetaminophen] Itching, Nausea And Vomiting and Swelling    Patient Measurements: Height: 5\' 5"  (165.1 cm) Weight: 72.8 kg (160 lb 7.9 oz) IBW/kg (Calculated) : 57  Vital Signs: Temp: 99.1 F (37.3 C) (04/15 0801) Temp Source: Oral (04/15 0801) BP: 115/83 (04/15 0801) Pulse Rate: 93 (04/15 0801)  Labs: Recent Labs    07/25/20 07/27/20 07/25/20 1000 07/26/20 0253 07/26/20 0429 07/27/20 0225  HGB 9.6*  --   --  9.6* 10.1*  HCT 34.6*  --   --  34.2* 36.3  PLT 830*  --   --  775* 742*  HEPARINUNFRC 0.85* 0.63 >1.10*  --  0.51  CREATININE  --   --  0.48  --  0.57    Estimated Creatinine Clearance: 98.1 mL/min (by C-G formula based on SCr of 0.57 mg/dL).  Assessment: 35 y.o. female with RUE occlusion s/p thrombectomy continues on heparin.  S/p PEG tube placement today Heparin to resume this evening at 6 pm 4/14  Heparin level therapeutic this AM - now to transition to Eliquis 5 mg per tube BID  Goal of Therapy:  Heparin level 0.3-0.5 units/mL Monitor platelets by anticoagulation protocol: Yes   Plan:  Continue heparin at 2550 units / hr Daily heparin level, CBC  Follow up transition to DOAC -> 11:30 am transitioned to Eliquis 5 mg po BID  Thank you 5/14, PharmD 07/27/2020 8:30 AM

## 2020-07-28 ENCOUNTER — Inpatient Hospital Stay (HOSPITAL_COMMUNITY): Payer: Medicaid Other

## 2020-07-28 DIAGNOSIS — Z794 Long term (current) use of insulin: Secondary | ICD-10-CM

## 2020-07-28 DIAGNOSIS — E119 Type 2 diabetes mellitus without complications: Secondary | ICD-10-CM

## 2020-07-28 DIAGNOSIS — E11311 Type 2 diabetes mellitus with unspecified diabetic retinopathy with macular edema: Secondary | ICD-10-CM

## 2020-07-28 DIAGNOSIS — R5081 Fever presenting with conditions classified elsewhere: Secondary | ICD-10-CM

## 2020-07-28 DIAGNOSIS — I63131 Cerebral infarction due to embolism of right carotid artery: Secondary | ICD-10-CM | POA: Diagnosis not present

## 2020-07-28 DIAGNOSIS — IMO0002 Reserved for concepts with insufficient information to code with codable children: Secondary | ICD-10-CM

## 2020-07-28 DIAGNOSIS — I63511 Cerebral infarction due to unspecified occlusion or stenosis of right middle cerebral artery: Secondary | ICD-10-CM

## 2020-07-28 DIAGNOSIS — I749 Embolism and thrombosis of unspecified artery: Secondary | ICD-10-CM | POA: Diagnosis not present

## 2020-07-28 DIAGNOSIS — R509 Fever, unspecified: Secondary | ICD-10-CM | POA: Diagnosis not present

## 2020-07-28 DIAGNOSIS — D72829 Elevated white blood cell count, unspecified: Secondary | ICD-10-CM | POA: Diagnosis not present

## 2020-07-28 DIAGNOSIS — I10 Essential (primary) hypertension: Secondary | ICD-10-CM

## 2020-07-28 LAB — URINALYSIS, COMPLETE (UACMP) WITH MICROSCOPIC
Bilirubin Urine: NEGATIVE
Glucose, UA: NEGATIVE mg/dL
Hgb urine dipstick: NEGATIVE
Ketones, ur: NEGATIVE mg/dL
Nitrite: NEGATIVE
Protein, ur: NEGATIVE mg/dL
Specific Gravity, Urine: 1.023 (ref 1.005–1.030)
pH: 7 (ref 5.0–8.0)

## 2020-07-28 LAB — GLUCOSE, CAPILLARY
Glucose-Capillary: 135 mg/dL — ABNORMAL HIGH (ref 70–99)
Glucose-Capillary: 141 mg/dL — ABNORMAL HIGH (ref 70–99)
Glucose-Capillary: 152 mg/dL — ABNORMAL HIGH (ref 70–99)
Glucose-Capillary: 156 mg/dL — ABNORMAL HIGH (ref 70–99)
Glucose-Capillary: 173 mg/dL — ABNORMAL HIGH (ref 70–99)
Glucose-Capillary: 199 mg/dL — ABNORMAL HIGH (ref 70–99)
Glucose-Capillary: 213 mg/dL — ABNORMAL HIGH (ref 70–99)
Glucose-Capillary: 228 mg/dL — ABNORMAL HIGH (ref 70–99)

## 2020-07-28 LAB — CBC
HCT: 34.6 % — ABNORMAL LOW (ref 36.0–46.0)
Hemoglobin: 9.6 g/dL — ABNORMAL LOW (ref 12.0–15.0)
MCH: 22.3 pg — ABNORMAL LOW (ref 26.0–34.0)
MCHC: 27.7 g/dL — ABNORMAL LOW (ref 30.0–36.0)
MCV: 80.5 fL (ref 80.0–100.0)
Platelets: 519 10*3/uL — ABNORMAL HIGH (ref 150–400)
RBC: 4.3 MIL/uL (ref 3.87–5.11)
RDW: 25.6 % — ABNORMAL HIGH (ref 11.5–15.5)
WBC: 27 10*3/uL — ABNORMAL HIGH (ref 4.0–10.5)
nRBC: 0.1 % (ref 0.0–0.2)

## 2020-07-28 LAB — BLOOD GAS, ARTERIAL
Acid-Base Excess: 4.3 mmol/L — ABNORMAL HIGH (ref 0.0–2.0)
Bicarbonate: 28 mmol/L (ref 20.0–28.0)
Drawn by: 164
FIO2: 21
O2 Saturation: 90.9 %
Patient temperature: 37
pCO2 arterial: 39.3 mmHg (ref 32.0–48.0)
pH, Arterial: 7.467 — ABNORMAL HIGH (ref 7.350–7.450)
pO2, Arterial: 62.4 mmHg — ABNORMAL LOW (ref 83.0–108.0)

## 2020-07-28 LAB — BASIC METABOLIC PANEL
Anion gap: 15 (ref 5–15)
BUN: 10 mg/dL (ref 6–20)
CO2: 25 mmol/L (ref 22–32)
Calcium: 9.8 mg/dL (ref 8.9–10.3)
Chloride: 94 mmol/L — ABNORMAL LOW (ref 98–111)
Creatinine, Ser: 0.55 mg/dL (ref 0.44–1.00)
GFR, Estimated: >60 mL/min (ref >60)
Glucose, Bld: 151 mg/dL — ABNORMAL HIGH (ref 70–99)
Potassium: 4.2 mmol/L (ref 3.5–5.1)
Sodium: 134 mmol/L — ABNORMAL LOW (ref 135–145)

## 2020-07-28 MED ORDER — IOHEXOL 350 MG/ML SOLN
100.0000 mL | Freq: Once | INTRAVENOUS | Status: AC | PRN
  Administered 2020-07-28: 100 mL via INTRAVENOUS

## 2020-07-28 MED ORDER — MELATONIN 3 MG PO TABS
3.0000 mg | ORAL_TABLET | Freq: Every day | ORAL | Status: DC
  Administered 2020-07-28 – 2020-08-02 (×6): 3 mg
  Filled 2020-07-28 (×6): qty 1

## 2020-07-28 MED ORDER — SODIUM CHLORIDE 0.9 % IV SOLN
2.0000 g | INTRAVENOUS | Status: AC
  Administered 2020-07-28 – 2020-08-01 (×5): 2 g via INTRAVENOUS
  Filled 2020-07-28 (×3): qty 20
  Filled 2020-07-28: qty 2
  Filled 2020-07-28 (×2): qty 20

## 2020-07-28 NOTE — Progress Notes (Addendum)
STROKE TEAM PROGRESS NOTE   INTERVAL HISTORY Patient is in bed this AM and has a new item to rest or legs on and help prevent movement of patient into her frequent horizontal state. Patient reports that she continues to have chest pain and describes it has sharp. Patient localizes the pain to the Left side. Patient reports that it hurts to take deep breaths. Patient also reports that she does not feel as hot as she did yesterday. Patient attention is poorer today than yesterday, but patient is able to identify clinical tea, correctly today. Patient asks about going home today. Patient does not endorse any hallucinations or delusions on exam today.   Vitals:   07/27/20 2245 07/27/20 2339 07/28/20 0310 07/28/20 1015  BP: 112/88 118/73 104/67 120/68  Pulse: (!) 126 (!) 108 (!) 101 (!) 123  Resp:  20 20   Temp: 98.4 F (36.9 C) 98.7 F (37.1 C) 99.4 F (37.4 C)   TempSrc: Oral Oral Oral   SpO2: 98% 97% 96%   Weight:      Height:       CBC:  Recent Labs  Lab 07/27/20 0225 07/28/20 0206  WBC 21.4* 27.0*  HGB 10.1* 9.6*  HCT 36.3 34.6*  MCV 78.7* 80.5  PLT 742* 638*   Basic Metabolic Panel:  Recent Labs  Lab 07/27/20 0225 07/28/20 0206  NA 137 134*  K 4.4 4.2  CL 98 94*  CO2 24 25  GLUCOSE 190* 151*  BUN 8 10  CREATININE 0.57 0.55  CALCIUM 9.9 9.8    IMAGING past 24 hours DG CHEST PORT 1 VIEW  Result Date: 07/27/2020 CLINICAL DATA:  Fever EXAM: PORTABLE CHEST 1 VIEW COMPARISON:  Portable exam 1250 hours compared to 07/18/2020 FINDINGS: Normal heart size, mediastinal contours, and pulmonary vascularity. Lungs clear. No pleural effusion or pneumothorax. Bones unremarkable. IMPRESSION: No acute abnormalities. Electronically Signed   By: Lavonia Dana M.D.   On: 07/27/2020 14:28    PHYSICAL EXAM General: caucasian female, lying in bed with R leg hanging over the rail  Lungs: Symmetrical Chest rise, no labored breathing  Cardio: Regular Rate and Rhythm  Abdomen: Soft,  non-tender  Skin:R handexposed today, warmer to touch. Continues to have darkening of the tips of the thumb, and first two fingers.  They appear a bit darker today.    Neuro -Alert oriented to person and place.  Patient reports that she does recognize the Neurology team as her doctors today. Speech is minimal today, patient stares more and when she does talk it is mostly a whisper today. Comprehension is intact.Patient continues to have right gaze preference but able to cross midline today, unable to maintain gaze to the left.Left facial droop. Tongue midline.RUE 4+/5 and RLE purposefulmovement against gravity (3/5)able to wiggle toes and LUE flaccid/ atonic with external rotation, splint removed during day. LLEexternally rotated. Sensation:LLE and RLE not intact to light touch or noxious stimuli. Extinction is positive, as patient does has preference to the right on light touch.   ASSESSMENT/PLAN Ms.Xayla L Priceis a 35 y.o.femalewith history of uncontrolled diabetesandhypertensionpresenting with left sided weakness and slurred speech.Per family there is a complicated social situation at this time. She lives intermittently in an apartment by herself but was kicked out and they went to pick her up at 5:30 AM. At that point she seemed "off"and generally quiet but did not seem to have any difficulty with left-sided weakness.Family attributed this to psychosocial issues and potential substance use noting that her  sister passed away from an overdose in October. Later in the morning she complained of diffuse body pain and was administered gabapentin and a muscle relaxer.At 11:30 AM she stood up and walkedto the showerand at1215 she was found on the bottom of the tub with slurred speech and difficulty moving the left side. She was not preactivated by EMS, but on arrival in the ED clearly appear to have a doorway examination consistent with a right MCA territory infarct.CTA  revealed right ICA occlusion as well as a right common carotid bifurcation thrombus extending into the external carotid (felt to be the source of the occlusion). CT perfusion imaging demonstrated possible core infarct of 51 mL (though notably this was obtained within 2 hours of symptom onset and therefore may reflect an overestimate of the true core). Mismatch ratio was 2 with a penumbra of 63 mL. Case was discussed with Dr. Estanislado Pandy of neuro interventional radiology and Dr. Cheral Marker of [facility] and the patient was accepted for transfer to [facility] for emergent intervention. She was also started on TPA after consenting the patient's emergency contact with full discussion of risks of bleeding and importance of accurately determining the patient's last known well.Ms. Jeanann Lewandowsky confirm the last known well as described above and agreed that the patient would want TPA given the risk/benefit.She confirmed that the patient had no recent procedures or other contraindications to TPA.Regarding her baseline, she is functional at baseline and family believes she is smoking at [place] a day currently. She is on Metformin for her diabetes, does not take oral contraceptive pills, has had no recent surgeries or procedures. PatientPEG has been placed successfully; however patient leukocytosis worsened overnight to 21.4. This may be a reaction to recent surgery will continue to monitor. Test orderd by Heme/onc continue to indicate that the patient is responding to chronic inflammation likely reaction to prior procedures. On neurologic exam patient was able to cross midline today for the first time. Unfortunately patient continues to have complete neglect of the L arm and it is externally rotated in the splint. Patient did not have movement in her Left leg but she has had varying degrees of recognition to the LLE throughout hospitalization. Patient continued to report chest pain this AM and was able to  specifically point to her chest this AM. EKG was ordered but was not concerning for ACS. Troponin levels were also ordered but remain pending. Patient PEG appears to be functioning well will transition patient from IV heparin to Eliquis. On exam this AM patient neurological exam remains stable. Patient gaze remains stable with yesterday as she is able to cross midline but not maintain. More concerning is patient's worsening leukocytosis. CXR was negative and UA was not concerning for UTI. Patient has been reporting chest pain for two days. Today patient indicated that she did not want to take a deep breath because it causes her pain to worsen. ECG for patient displayed sinus tachycardia but was otherwise normal. Patient will receive CT A and ABG due to concern for PE; however PE is unlikely as patient has been receiving IV heparin and is transitioning to Eliquis today. Patient PLTs have also continued their downward trend. Internal Medicine has been consulted due to patient's reported chest pain of unknown etiology and progressing leukocytosis with most recent T max 100.9 overnight. There is some concern that toxins from ischemic limb may be entering the blood stream as patient's ischemic fingers appear warmer today and patient has been on IV heparin from ulnar  artery thrombus.   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   CT no acute finding but suspicious for right MCA early ischemic changes.   CTA head and neck showed right ICA terminal and proximal MCA occlusion, right CCA nonocclusive thrombus.  CT perfusion showed large penumbra.   Status post thrombectomy with right terminal ICA, right  PCA and right ACA TICI2breperfusion however does have distal micro emboli.  MRI showed large right MCA infarct with 75m MLS and hemorrhagic transformation.   MRA showed patent intracranial vessels.  CT Head WO Contrast 3/26 620 - Improved midline shift and normalized left lateral ventricular volume after decompressive craniectomy. No progressive infarct or hemorrhage.  2D Echo- EF: 60-65%. No wall motion abnormality.  TCD Bubble - No PFO  TEE today no source of emboli, no PFO  DVT [place]-No evidence of DVT bilaterally  LDL99  HgbA1c10.6  VTE prophylaxis -heparin IV  No antithromboticprior to admission, now on heparin IV for right ulnar artery occlusion,will attempt to transition to Eliquis, pharmacy managed  Therapy recommendations:CIR however no family support so SNF is working plan  Disposition:SNF due to lack of family support, will reconsult CIR as there has been difficulty in finding a SNF to accept patient due to ageand + Cocaine on admission UDS  Cerebral edema, resolved  CT Headrepeat3/24 -Continued interval evolution of right MCA distribution infarct with associated hemorrhagic transformation. Worsened regional mass effect and edema with increased right-to-left midline shift now measuring up to 9 mm.Increased dilatation of the right lateral ventricle concerning for developing ventricular trapping. No other new acute intracranial abnormality.  CT repeat 3/25 unchangedright cerebral cytotoxic edema with central hemorrhage. Midline shift is similar at 1 cm.Unchanged left lateral ventricle dilatation with evidence of transependymal flow.  S/pdecompressive hemicraniectomy (3/25)with Dr. SVertell Limber CT Head WO Contrast 3/26 620 - Improved midline shift and normalized left lateral ventricular volume after decompressive craniectomy. No progressive infarct or hemorrhage.  Keppra discontinued  Na 137->141->147->150>158>150> 141>139>140>139>138>137--  stable  Staple removal completed on 4/8  Thrombocytosis  Plt 915> 830> 775> 742> 519  CRP (2.9)and ESR (92)  Cardioliopin ab IgM elevated (15), B2-glycoprotein IgA elevated (31), ANA ab (-)   Eliquis 535mBID, transition from heparin 4/16  IV feraheme weekly x2 per heme/onc recc's patient has no known allergies beyond Norco  Fever, Leukocytosis,   Tmax 101.7->102.5> 98.6> 98.7>99.9>afebrile>>>100.8 (4/14)>100.9  WBC -16.2>15.7>16.8>18.6->21.5->19.6->14.8>13.7>12.9>12.5>11.1>11.0>21.4>27  TEE no endocarditis  UA (4/3) neg  CXR4/4-left perihilar atelectasis  CXR 4/7 No acute findings   Abd Xray 4/7 No acute findings  Respiratory Culture (3/26) - Strep Pneumoniae  Blood CxNo growth after5days drawn 3/25  Completed 7 day course of Ceftriaxone on 4/2  ID followed patient did not think endocarditis.  New fever post-op :4/14  UA: negative  CXR: no acute abnormalites    Tachycardia  Leukocytosis  WBC -16.2>15.7>16.8>18.6->21.5->19.6->14.8>13.7>12.9>12.5>11.1>11>21.4>27  Likely related to ischemic right hand  on metoprolol 5064mid   UA: negative  LE venous doppler repeat (4/3): No evidence of deep vein thrombosis   Recent EKG sinus tachycardia  Consult to IM Hospitalist   Acute onset Chest Pain - EKG: Sinus tachy cardia - Troponin 4,4 - CXR: no acute abnormalities -CTA pending - ABG: pH 7.467, pCO2 39.3, PO2 62.4, HCO3 28.0  Acid base- excess 4.3   R critical limb ischemia  S/p thrombectomy (3/24) for right brachial artery occlusion  Now Right hand pain, cool to touch, dry eschar wound on the palmar region, decreased dopplers per RN, and tachycardia, tachypnea with leukocytosis  UE arterial duplex (4/3) - right ulnar artery occlusion.   VVS Dr. Carlis Abbott - No surgical intervention at this time. Recommends medical management with Heparin infusion.   Discussed with Dr. Ronnald Ramp with Neurosurgery ok'd the initiation of Heparin  Heparin  discontinued  Transitioned to Eliquis 34m BID  Percocet 5/325 q 6hr prn for right hand pain  Delirium - Melatonin 335mQHS - Delirium precautions  Hyperlipidemia  Home meds:none  LDL 99, goal < 70  On lipitor 40 now  Continue statin at discharge  Poorly controlled Diabetes type II, with hyperglycemia   Home meds:Metformin, Glargine, Aspart  HgbA1c 10.6, goal < 7.0  CBGs Recent Labs    07/28/20 0007 07/28/20 0408 07/28/20 0815  GLUCAP 228* 135* 213*      SSI  Levemir to 35 units bid  Novolog 6 units every 4 hours  Dysphagia  Feeding via cortrak  SLP following, NPOremains in place , patient failed swallow study today  S/p Cortrak 4/8 -wason TF @ 50 and IVF @ 2591Pertrauma srugery rePEG: "meds now, tube feeds in 4h, keep binder on at alltimes to protect PEG"  Cocaine abuse  UDS positive for cocaine  Cessation education will be provided  Constipation- improving  On bowel regimen  BM 4/11  Sick thyroid- resolved  TSH: 0.307 (Low) Free T4:1.27 (high)  On metoprolol 25 bid  Repeat TSH and FT4 are WNL  IDA  Per Heme/onc recc's : IV Fereheme weekly x2(1/2)  4/8 Iron level low at 21, Ferritin and TIBC wnl   Tobacco abuse  Current smoker  Smoking cessation counselingwill beprovided  Other Stroke Risk Factors Active Problems  Substance abuse - UDS: THC POSITIVEWill advise patientto stop using due to stroke risk.  Thrombocytosislikely reactive as platelet counts were normal on admission  FH [facility] stroke at early age, [facility]  JaDamita DunningsMD PGY-1  ATTENDING NOTE: I reviewed above note and agree with the assessment and plan. Pt was seen and examined.   Pt lying in bed, still complaining of chest pain and pointed to left chest. She denies HA. Neuro stable. The right hand still has right thumb and index finger discoloration but fingers warmer than yesterday. However,  leukocytosis continues to worsen with WBC now 27.0. no fever this am but had 100.8 last night. UA showed WBC 11-20. CXR neg. Blood culture and urine culture pending. ABG also showed low PO2 and slight alkalosis. Will do CTA chest to rule out PE. Will also re-consult hospitalist service for assistance.    JiRosalin HawkingMD PhD Stroke Neurology 07/28/2020 1:22 PM   To contact Stroke Continuity provider, please refer to [email or web address] hours, contact General Neurology

## 2020-07-28 NOTE — Plan of Care (Signed)
  Problem: Clinical Measurements: Goal: Diagnostic test results will improve Outcome: Progressing   Problem: Clinical Measurements: Goal: Will remain free from infection Outcome: Progressing   Problem: Clinical Measurements: Goal: Ability to maintain clinical measurements within normal limits will improve Outcome: Progressing   Problem: Health Behavior/Discharge Planning: Goal: Ability to manage health-related needs will improve Outcome: Progressing   Problem: Education: Goal: Knowledge of General Education information will improve Description: Including pain rating scale, medication(s)/side effects and non-pharmacologic comfort measures Outcome: Progressing   

## 2020-07-28 NOTE — Progress Notes (Signed)
Patient ID: Brandi Myers, female   DOB: 15-Mar-1986, 35 y.o.   MRN: [id number]                Medical Consultation   Brandi Myers  HLK:[id number]  DOB: 11/23/1985  DOA: 07/04/2020  [facility]P: [facility] [place]  Requesting physician: Damita Dunnings  Reason for consultation: Rising WBC, no source of infection   History of Present Illness: Brandi Myers is an 35 y.o. female history of hypertension and type 2 diabetes and polysubstance use.  Admitted for prolonged period of time to the neurology service.  She has a very complicated neurologic history which involved a large right MCA stroke followed by tPA, thrombectomy and hemorrhage requiring hemicraniectomy.  Hospital course has been complicated by pneumonia and right hand ischemia requiring a right radial ulnar and brachial artery thrombectomy.  She underwent PEG placement on 4/14.  She subsequently had a marked drop in her white count from 11-21 and then today to 27.  We are asked to weigh in about this increasing WBC. Patient did have blood cultures urinalysis chest x-ray yesterday all of which were negative.  Patient reports chest pain seems to be somewhat left-sided.  Review of Systems:  ROS Unable to obtain secondary to patient's mental status  Past Medical History: Past Medical History:  Diagnosis Date  . Diabetes mellitus without complication ([place])   . Hypertension     Past Surgical History: Past Surgical History:  Procedure Laterality Date  . BUBBLE STUDY  07/19/2020   Procedure: BUBBLE STUDY;  Surgeon: Donato Heinz, MD;  Location: [place];  Service: Cardiovascular;;  . CESAREAN SECTION    . CHOLECYSTECTOMY    . CRANIOTOMY Right 07/06/2020   Procedure: RIGHT CRANIECTOMY WITH PLACEMENT OF BONE FLAP IN ABDOMEN;  Surgeon: Erline Levine, MD;  Location: [place];  Service: Neurosurgery;  Laterality: Right;  . ESOPHAGOGASTRODUODENOSCOPY (EGD) WITH PROPOFOL N/A 07/26/2020   Procedure:  ESOPHAGOGASTRODUODENOSCOPY (EGD) WITH PROPOFOL;  Surgeon: Georganna Skeans, MD;  Location: [place];  Service: General;  Laterality: N/A;  . IR ANGIO INTRA EXTRACRAN SEL COM CAROTID INNOMINATE UNI L MOD SED  07/04/2020  . IR ANGIO VERTEBRAL SEL VERTEBRAL UNI R MOD SED  07/04/2020  . IR CT HEAD LTD  07/04/2020  . IR CT HEAD LTD  07/04/2020  . IR PERCUTANEOUS ART THROMBECTOMY/INFUSION INTRACRANIAL INC DIAG ANGIO  07/04/2020  . PEG PLACEMENT N/A 07/26/2020   Procedure: PERCUTANEOUS ENDOSCOPIC GASTROSTOMY (PEG) PLACEMENT;  Surgeon: Georganna Skeans, MD;  Location: [place];  Service: General;  Laterality: N/A;  . RADIOLOGY WITH ANESTHESIA N/A 07/04/2020   Procedure: IR WITH ANESTHESIA;  Surgeon: Radiologist, Medication, MD;  Location: [facility] [place];  Service: Radiology;  Laterality: N/A;  . TEE WITHOUT CARDIOVERSION N/A 07/19/2020   Procedure: TRANSESOPHAGEAL ECHOCARDIOGRAM (TEE);  Surgeon: Donato Heinz, MD;  Location: [place];  Service: Cardiovascular;  Laterality: N/A;  . THROMBECTOMY BRACHIAL ARTERY Right 07/05/2020   Procedure: RIGHT UPPER EXTREMITY THROMBECTOMY;  Surgeon: Marty Heck, MD;  Location: [place];  Service: Vascular;  Laterality: Right;     Allergies:   Allergies  Allergen Reactions  . Norco [Hydrocodone-Acetaminophen] Itching, Nausea And Vomiting and Swelling     Social History:  reports that she has been smoking cigarettes. She has been smoking about 0.10 packs per day. She has never used smokeless tobacco. She reports current alcohol use. She reports that she does not use drugs.   Family History: Family History  Problem Relation Age  of Onset  . Diabetes Maternal Grandmother      Physical Exam: Vitals:   07/28/20 1015 07/28/20 1100 07/28/20 1243 07/28/20 1443  BP: 120/68 (!) 110/94 112/83 117/84  Pulse: (!) 123 (!) 108    Resp:  _0 Temp:   98.9 F (37.2 C)   TempSrc:   Oral   SpO2:   99% 99%  Weight:      Height:        Constitutional:  Alert and awake, not in any acute distress. Eyes: PERLA, EOMI, irises appear normal, anicteric sclera ENMT: external ears and nose appear normal. Lips appears normal  Neck: neck appears normal, no masses, normal ROM, no thyromegaly, no JVD  CVS: S1-S2 clear, no murmur rubs or gallops, no LE edema, normal pedal pulses  Respiratory:  clear to auscultation bilaterally, no wheezing, rales or rhonchi. Respiratory effort normal. No accessory muscle use.  Abdomen: soft nontender  Musculoskeletal: Right hand shows a scar on the thenar eminence darkened areas on the thumb and first 2 digits Neuro: Patient is oriented to person and place will answer questions, left facial droop, left-sided weakness Skin: no rashes or lesions or ulcers, no induration or nodules   Data reviewed:  I have personally reviewed following labs and imaging studies Labs:  CBC: Recent Labs  Lab 07/24/20 0451 07/25/20 0633 07/26/20 0429 07/27/20 0225 07/28/20 0206  WBC 12.5* 11.1* 11.0* 21.4* 27.0*  HGB 10.0* 9.6* 9.6* 10.1* 9.6*  HCT 35.9* 34.6* 34.2* 36.3 34.6*  MCV 79.2* 78.1* 77.7* 78.7* 80.5  PLT 925* 830* 775* 742* 519*    Basic Metabolic Panel: Recent Labs  Lab 07/23/20 0402 07/24/20 0451 07/26/20 0253 07/27/20 0225 07/28/20 0206  NA 137 138 137 137 134*  K 4.0 4.1 3.9 4.4 4.2  CL 102 100 100 98 94*  CO2 _1 GLUCOSE 108* 116* 63* 190* 151*  BUN _2 CREATININE 0.74 0.53 0.48 0.57 0.55  CALCIUM 9.4 9.7 9.7 9.9 9.8   CBG: Recent Labs  Lab 07/27/20 2001 07/28/20 0007 07/28/20 0408 07/28/20 0815 07/28/20 1243  GLUCAP 165* 228* 135* 213* 173*   Urinalysis    Component Value Date/Time   COLORURINE AMBER (A) 07/28/2020 0009   APPEARANCEUR CLOUDY (A) 07/28/2020 0009   LABSPEC 1.023 07/28/2020 0009   PHURINE 7.0 07/28/2020 0009   GLUCOSEU NEGATIVE 07/28/2020 0009   HGBUR NEGATIVE 07/28/2020 0009   BILIRUBINUR NEGATIVE 07/28/2020 0009   KETONESUR NEGATIVE 07/28/2020 0009    PROTEINUR NEGATIVE 07/28/2020 0009   NITRITE NEGATIVE 07/28/2020 0009   LEUKOCYTESUR TRACE (A) 07/28/2020 0009   WBCs 11-20  Microbiology Recent Results (from the past 240 hour(s))  Culture, blood (routine x 2)     Status: None   Collection Time: 07/18/20  3:50 PM   Specimen: BLOOD LEFT HAND  Result Value Ref Range Status   Specimen Description BLOOD LEFT HAND  Final   Special Requests   Final    BOTTLES DRAWN AEROBIC ONLY Blood Culture adequate volume   Culture   Final    NO GROWTH 5 DAYS Performed at [facility], [address], [place], [place] [address]    Report Status 07/23/2020 FINAL  Final  Culture, blood (routine x 2)     Status: None   Collection Time: 07/18/20  3:57 PM   Specimen: BLOOD LEFT WRIST  Result Value Ref Range Status   Specimen Description BLOOD LEFT WRIST  Final   Special Requests   Final    BOTTLES DRAWN AEROBIC ONLY Blood Culture adequate volume   Culture   Final    NO GROWTH 5 DAYS Performed at [facility], [address] [place], [place] [address]    Report Status 07/23/2020 FINAL  Final  Culture, blood (Routine X 2) w Reflex to ID Panel     Status: None (Preliminary result)   Collection Time: 07/27/20  3:32 PM   Specimen: BLOOD  Result Value Ref Range Status   Specimen Description BLOOD SITE NOT SPECIFIED  Final   Special Requests   Final    BOTTLES DRAWN AEROBIC ONLY Blood Culture results may not be optimal due to an inadequate volume of blood received in culture bottles   Culture   Final    NO GROWTH < 24 HOURS Performed at [facility], [place] [address] [place], [place] [address]    Report Status PENDING  Incomplete  Culture, blood (Routine X 2) w Reflex to ID Panel     Status: None (Preliminary result)   Collection Time: 07/27/20  3:36 PM   Specimen: BLOOD  Result Value Ref Range Status   Specimen Description BLOOD SITE NOT SPECIFIED  Final   Special Requests   Final    BOTTLES DRAWN AEROBIC ONLY Blood Culture  results may not be optimal due to an inadequate volume of blood received in culture bottles   Culture   Final    NO GROWTH < 24 HOURS Performed at [facility], [place] [address] [place],  [address]    Report Status PENDING  Incomplete     Inpatient Medications:   Scheduled Meds: . apixaban  5 mg Per Tube BID  . atorvastatin  40 mg Per Tube q1800  . chlorhexidine gluconate (MEDLINE KIT)  15 mL Mouth Rinse BID  . Chlorhexidine Gluconate Cloth  6 each Topical Daily  . docusate  100 mg Per Tube BID  . gabapentin  600 mg Per Tube Q8H  . insulin aspart  0-15 Units Subcutaneous Q4H  . insulin aspart  6 Units Subcutaneous Q4H  . insulin detemir  35 Units Subcutaneous BID  . mouth rinse  15 mL Mouth Rinse q12n4p  . melatonin  3 mg Per Tube QHS  . metoprolol tartrate  50 mg Per Tube BID  . multivitamin with minerals  1 tablet Per Tube Daily  . polyethylene glycol  17 g Per Tube Daily  . sodium chloride flush  10-40 mL Intracatheter Q12H   Continuous Infusions: . sodium chloride Stopped (07/13/20 1028)  . sodium chloride Stopped (07/26/20 1046)  . feeding supplement (GLUCERNA 1.5 CAL) 1,000 mL (07/28/20 1247)  . ferumoxytol Stopped (07/24/20 2152)     Radiological Exams on Admission: CT ANGIO CHEST PE W OR WO CONTRAST  Result Date: 07/28/2020 CLINICAL DATA:  Pulmonary embolism post treatment. EXAM: CT ANGIOGRAPHY CHEST WITH CONTRAST TECHNIQUE: Multidetector CT imaging of the chest was performed using the standard protocol during bolus administration of intravenous contrast. Multiplanar CT image reconstructions and MIPs were obtained to evaluate the vascular anatomy. CONTRAST:  151m OMNIPAQUE IOHEXOL 350 MG/ML SOLN COMPARISON:  06/26/2007 FINDINGS: Cardiovascular: Heart size is normal. No pericardial effusion. No significant atherosclerotic calcification coronary artery 9 for thoracic aorta. No aneurysm. The pulmonary arteries are well opacified and there is no evidence for  acute pulmonary embolus. Mediastinum/Nodes: The visualized portion of the thyroid gland has a normal appearance. No significant mediastinal, hilar,  or axillary adenopathy. Esophagus is unremarkable. Lungs/Pleura: Small bilateral pleural effusions and minimal bibasilar atelectasis. Airways are patent. Upper Abdomen: Liver is diffusely low attenuation, consistent with fatty infiltration. Cholecystectomy. Musculoskeletal: No chest wall abnormality. No acute or significant osseous findings. Review of the MIP images confirms the above findings. IMPRESSION: 1. Technically adequate exam showing no acute pulmonary embolus. 2. Small bilateral pleural effusions and minimal bibasilar atelectasis. 3. Hepatic steatosis. 4. Cholecystectomy. Electronically Signed   By: Nolon Nations M.D.   On: 07/28/2020 12:42   DG CHEST PORT 1 VIEW  Result Date: 07/27/2020 CLINICAL DATA:  Fever EXAM: PORTABLE CHEST 1 VIEW COMPARISON:  Portable exam 1250 hours compared to 07/18/2020 FINDINGS: Normal heart size, mediastinal contours, and pulmonary vascularity. Lungs clear. No pleural effusion or pneumothorax. Bones unremarkable. IMPRESSION: No acute abnormalities. Electronically Signed   By: Lavonia Dana M.D.   On: 07/27/2020 14:28    Impression/Recommendations Active Problems:   Stroke (cerebrum) (HCC)   Middle cerebral artery embolism, right   Thrombocytosis   Leukocytosis   Arterial thrombosis (HCC)   Type 2 diabetes mellitus (HCC)   Benign essential hypertension  Stroke Per primary team On Lipitor And Eliquis  Middle cerebral artery embolism right See above  Thrombocytosis Likely reactive  Leukocytosis Is up significantly.  No obvious source though urine does have 11-20 WBCs. Added culture on for this, will begin Rocephin empirically CT chest today negative for evidence of PE or pneumonia Blood cultures are pending Abdomen is soft, however given recent surgery and elevated WBC if continues to be persistently  up and urine cultures negative would consider CT of the abdomen and pelvis.  Arterial thrombosis of right hand Unclear how much of symptoms are related to this. I do not see an obvious source or something to drain and this has been here for a long time to not shortness implicated in her new rise in elevated WBC. Will continue to monitor  Hypertension On Lopressor, BP is well controlled  Type 2 diabetes Continue Levemir 35 6 units of subcu insulin every 4 hours with continued hyperglycemia   Thank you for this consultation.  Our [facility] hospitalist team will follow the patient with you.   Time Spent: 51 minutes   Donnamae Jude M.D. [facility] 07/28/2020, 3:01 PM

## 2020-07-28 NOTE — TOC Progression Note (Signed)
Transition of Care [facility]) - Progression Note    Patient Details  Name: CANDIDA VETTER MRN: [id number] Date of Birth: 25-Dec-1985  [facility] CM/SW Contact  Carley Hammed, [place] Phone Number: 07/28/2020, 10:52 AM  Clinical Narrative:    CSW noted that no one from the facility had called CSW back yesterday to discuss this pt or insurance authorization. CSW attempted again and was told someone would return the call. SW will continue to follow.   Expected Discharge Plan: Skilled Nursing Facility Barriers to Discharge: Continued Medical Work up,Insurance Authorization  Expected Discharge Plan and Services Expected Discharge Plan: Skilled Nursing Facility       Living arrangements for the past 2 months: Single Family Home                                       Social Determinants of Health (SDOH) Interventions    Readmission Risk Interventions No flowsheet data found.

## 2020-07-29 DIAGNOSIS — D72829 Elevated white blood cell count, unspecified: Secondary | ICD-10-CM | POA: Diagnosis not present

## 2020-07-29 DIAGNOSIS — D75839 Thrombocytosis, unspecified: Secondary | ICD-10-CM | POA: Diagnosis not present

## 2020-07-29 DIAGNOSIS — I10 Essential (primary) hypertension: Secondary | ICD-10-CM

## 2020-07-29 DIAGNOSIS — I749 Embolism and thrombosis of unspecified artery: Secondary | ICD-10-CM | POA: Diagnosis not present

## 2020-07-29 LAB — GLUCOSE, CAPILLARY
Glucose-Capillary: 117 mg/dL — ABNORMAL HIGH (ref 70–99)
Glucose-Capillary: 156 mg/dL — ABNORMAL HIGH (ref 70–99)
Glucose-Capillary: 153 mg/dL — ABNORMAL HIGH (ref 70–99)
Glucose-Capillary: 176 mg/dL — ABNORMAL HIGH (ref 70–99)
Glucose-Capillary: 210 mg/dL — ABNORMAL HIGH (ref 70–99)
Glucose-Capillary: 152 mg/dL — ABNORMAL HIGH (ref 70–99)
Glucose-Capillary: 101 mg/dL — ABNORMAL HIGH (ref 70–99)

## 2020-07-29 LAB — BASIC METABOLIC PANEL
Anion gap: 10 (ref 5–15)
BUN: 9 mg/dL (ref 6–20)
CO2: 28 mmol/L (ref 22–32)
Calcium: 9.5 mg/dL (ref 8.9–10.3)
Chloride: 95 mmol/L — ABNORMAL LOW (ref 98–111)
Creatinine, Ser: 0.51 mg/dL (ref 0.44–1.00)
GFR, Estimated: >60 mL/min (ref >60)
Glucose, Bld: 115 mg/dL — ABNORMAL HIGH (ref 70–99)
Potassium: 3.7 mmol/L (ref 3.5–5.1)
Sodium: 133 mmol/L — ABNORMAL LOW (ref 135–145)

## 2020-07-29 LAB — CBC
HCT: 32 % — ABNORMAL LOW (ref 36.0–46.0)
Hemoglobin: 9.2 g/dL — ABNORMAL LOW (ref 12.0–15.0)
MCH: 22.9 pg — ABNORMAL LOW (ref 26.0–34.0)
MCHC: 28.8 g/dL — ABNORMAL LOW (ref 30.0–36.0)
MCV: 79.6 fL — ABNORMAL LOW (ref 80.0–100.0)
Platelets: 495 10*3/uL — ABNORMAL HIGH (ref 150–400)
RBC: 4.02 MIL/uL (ref 3.87–5.11)
RDW: 26.1 % — ABNORMAL HIGH (ref 11.5–15.5)
WBC: 21.2 10*3/uL — ABNORMAL HIGH (ref 4.0–10.5)
nRBC: 0 % (ref 0.0–0.2)

## 2020-07-29 LAB — URINE CULTURE: Culture: NO GROWTH

## 2020-07-29 NOTE — Progress Notes (Signed)
STROKE TEAM PROGRESS NOTE   INTERVAL HISTORY Patient RN is at the bedside. Pt is asking RN to call her aunt to get her home. RN stated that pt PEG tube site has some leakage earlier today, but now is OK after cleaning the area. Will need to continue monitor, will call trauma surgery PA if needed. Leukocytosis improved from yesterday but still high. Tachycardia also better but still present.   Vitals:   07/28/20 2311 07/29/20 0006 07/29/20 0337 07/29/20 1400  BP:   111/82 (!) 124/97  Pulse: (!) 106   (!) 106  Resp: _0 Temp: 100.2 F (37.9 C) 98.8 F (37.1 C) 98.5 F (36.9 C) 98.4 F (36.9 C)  TempSrc:  Oral Axillary Oral  SpO2: 97%  99% 96%  Weight:      Height:       CBC:  Recent Labs  Lab 07/28/20 0206 07/29/20 0159  WBC 27.0* 21.2*  HGB 9.6* 9.2*  HCT 34.6* 32.0*  MCV 80.5 79.6*  PLT 519* 694*   Basic Metabolic Panel:  Recent Labs  Lab 07/28/20 0206 07/29/20 0159  NA 134* 133*  K 4.2 3.7  CL 94* 95*  CO2 25 28  GLUCOSE 151* 115*  BUN 10 9  CREATININE 0.55 0.51  CALCIUM 9.8 9.5    IMAGING past 24 hours No results found.  PHYSICAL EXAM General: caucasian female, lying in bed with R leg hanging over the rail  Lungs: Symmetrical Chest rise, no labored breathing  Cardio: Regular Rate and Rhythm  Abdomen: Soft, non-tender  Skin:R handexposed today, warmer to touch. Continues to have darkening of the tips of the thumb, and first two fingers.  They appear a bit darker today.    Neuro -Alert oriented to person and place.  Patient reports that she does recognize the Neurology team as her doctors today. Speech is minimal today, patient stares more and when she does talk it is mostly a whisper today. Comprehension is intact.Patient continues to have right gaze preference but able to cross midline today, unable to maintain gaze to the left.Left facial droop. Tongue midline.RUE 4+/5 and RLE purposefulmovement against gravity (3/5)able to wiggle  toes and LUE flaccid/ atonic with external rotation, splint removed during day. LLEexternally rotated. Sensation:LLE and RLE not intact to light touch or noxious stimuli. Extinction is positive, as patient does has preference to the right on light touch.   ASSESSMENT/PLAN Ms.Basilia L Priceis a 35 y.o.femalewith y.o.femalewith history of uncontrolled diabetesandhypertensionpresenting with left sided weakness and slurred speech.Per family there is a complicated social situation at this time. She lives intermittently in an apartment by herself but was kicked out and they went to pick her up at 5:30 AM. At that point she seemed "off"and generally quiet but did not seem to have any difficulty with left-sided weakness.Family attributed this to psychosocial issues and potential substance use noting that her sister passed away from an overdose in October. Later in the morning she complained of diffuse body pain and was administered gabapentin and a muscle relaxer.At 11:30 AM she stood up and walkedto the showerand at1215 she was found on the bottom of the tub with slurred speech and difficulty moving the left side. She was not preactivated by EMS, but on arrival in the ED clearly appear to have a doorway examination consistent with a right MCA territory infarct.CTA revealed right ICA occlusion as well as a right common carotid bifurcation thrombus extending into the external carotid (felt to be the source  of the occlusion). CT perfusion imaging demonstrated possible core infarct of 51 mL (though notably this was obtained within 2 hours of symptom onset and therefore may reflect an overestimate of the true core). Mismatch ratio was 2 with a penumbra of 63 mL. Case was discussed with Dr. Estanislado Pandy of neuro interventional radiology and Dr. Cheral Marker of [facility] and the patient was accepted for transfer to [facility] for emergent intervention. She was also started on TPA after consenting the patient's  emergency contact with full discussion of risks of bleeding and importance of accurately determining the patient's last known well.Ms. Jeanann Lewandowsky confirm the last known well as described above and agreed that the patient would want TPA given the risk/benefit.She confirmed that the patient had no recent procedures or other contraindications to TPA.Regarding her baseline, she is functional at baseline and family believes she is smoking at [place] a day currently. She is on Metformin for her diabetes, does not take oral contraceptive pills, has had no recent surgeries or procedures. PatientPEG has been placed successfully; however patient leukocytosis worsened overnight to 21.4. This may be a reaction to recent surgery will continue to monitor. Test orderd by Heme/onc continue to indicate that the patient is responding to chronic inflammation likely reaction to prior procedures. On neurologic exam patient was able to cross midline today for the first time. Unfortunately patient continues to have complete neglect of the L arm and it is externally rotated in the splint. Patient did not have movement in her Left leg but she has had varying degrees of recognition to the LLE throughout hospitalization. Patient continued to report chest pain this AM and was able to specifically point to her chest this AM. EKG was ordered but was not concerning for ACS. Troponin levels were also ordered but remain pending. Patient PEG appears to be functioning well will transition patient from IV heparin to Eliquis. On exam this AM patient neurological exam remains stable. Patient gaze remains stable with yesterday as she is able to cross midline but not maintain. More concerning is patient's worsening leukocytosis. CXR was negative and UA was not concerning for UTI. Patient has been reporting chest pain for two days. Today patient indicated that she did not want to take a deep breath because it causes her pain to worsen. ECG for  patient displayed sinus tachycardia but was otherwise normal. Patient will receive CT A and ABG due to concern for PE; however PE is unlikely as patient has been receiving IV heparin and is transitioning to Eliquis today. Patient PLTs have also continued their downward trend. Internal Medicine has been consulted due to patient's reported chest pain of unknown etiology and progressing leukocytosis with most recent T max 100.9 overnight. There is some concern that toxins from ischemic limb may be entering the blood stream as patient's ischemic fingers appear warmer today and patient has been on IV heparin from ulnar artery thrombus.   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   CT no acute finding but suspicious for right MCA early ischemic changes.   CTA head and neck showed right ICA terminal and proximal MCA occlusion, right CCA nonocclusive thrombus.  CT perfusion showed large penumbra.   Status post thrombectomy with right terminal ICA, right PCA and right ACA  TICI2breperfusion however does have distal micro emboli.  MRI showed large right MCA infarct with 47m MLS and hemorrhagic transformation.   MRA showed patent intracranial vessels.  CT Head WO Contrast 3/26 620 - Improved midline shift and normalized left lateral ventricular volume after decompressive craniectomy. No progressive infarct or hemorrhage.  2D Echo- EF: 60-65%. No wall motion abnormality.  TCD Bubble - No PFO  TEE today no source of emboli, no PFO  DVT [place]-No evidence of DVT bilaterally  LDL99  HgbA1c10.6  VTE prophylaxis -eliquis now  No antithromboticprior to admission, now on Eliquis    Therapy recommendations:CIR however no family support so SNF is working plan  Disposition:pending  Cerebral edema, resolved  CT Headrepeat3/24 -Continued interval evolution of right MCA distribution infarct with associated hemorrhagic transformation. Worsened regional mass effect and edema with increased right-to-left midline shift now measuring up to 9 mm.Increased dilatation of the right lateral ventricle concerning for developing ventricular trapping. No other new acute intracranial abnormality.  CT repeat 3/25 unchangedright cerebral cytotoxic edema with central hemorrhage. Midline shift is similar at 1 cm.Unchanged left lateral ventricle dilatation with evidence of transependymal flow.  S/pdecompressive hemicraniectomy (3/25)with Dr. SVertell Limber CT Head WO Contrast 3/26 620 - Improved midline shift and normalized left lateral ventricular volume after decompressive craniectomy. No progressive infarct or hemorrhage.  Keppra discontinued 4/16  Staple removal completed on 4/8  Thrombocytosis  Plt 915> 830> 775> 742> 519->495  CRP (2.9)and ESR (92)  Cardioliopin ab IgM elevated (15), B2-glycoprotein IgA elevated (31), ANA ab (-)   Eliquis 565mBID, transition from heparin IV 4/16  IV feraheme weekly x2 per heme/onc recc's patient has no known allergies beyond Norco  Fever Leukocytosis   Tmax 101.7->102.5> 98.6> 98.7>99.9>afebrile>>>100.8->100.9->afebrile  WBC -16.2>15.7>16.8>18.6->21.5->19.6->14.8>13.7>12.9>12.5>11.1>11.0>21.4>27.0->21.2  TEE no endocarditis  UA (4/3) neg  CXR4/4-left perihilar atelectasis  CXR 4/7 No acute findings   Abd Xray 4/7 No acute findings  Respiratory Culture (3/26) - Strep Pneumoniae  Blood CxNo growth after5days drawn 3/25  Completed 7 day course of Ceftriaxone on 4/2  ID followed patient did not think endocarditis.  4/14 UA: negative, CXR: no acute abnormalites   Internal medicine on board, appreciate  help  Tachycardia  Chest pain Leukocytosis  WBC 11.1>11>21.4>27.0->21.2  on metoprolol 5079mid   LE venous doppler repeat (4/3): No evidence of deep vein thrombosis   Recent EKG sinus tachycardia  Troponin 4->4  CTA chest 4/16 no PE, no pneumonia  ABG: pH 7.467, PO2 62.4  Internal medicine on board, appreciate help  R critical limb ischemia  S/p thrombectomy (3/24) for right brachial artery occlusion  Now Right hand pain, cool to touch, dry eschar wound on the palmar region, decreased dopplers per RN, and tachycardia, tachypnea with leukocytosis  UE arterial duplex (4/3) - right ulnar artery occlusion.   VVS Dr. ClaCarlis AbbottNo surgical intervention at this time. Recommends medical management with Heparin infusion.   Discussed with Dr. JonRonnald Rampth Neurosurgery ok'd the initiation of Heparin  Heparin IV transitioned to Eliquis 5mg27mD  Percocet 5/325 q 6hr prn for right hand pain  Hyperlipidemia  Home meds:none  LDL 99, goal < 70  On lipitor 40 now  Continue statin at discharge  Poorly controlled Diabetes type II, with hyperglycemia   Home meds:Metformin, Glargine, Aspart  HgbA1c 10.6, goal < 7.0  CBGs  SSI  Levemir to 35 units bid  Novolog 6 units every 4 hours  Dysphagia  SLP following, NPOremains in place , patient failed swallow  study   S/p Cortrak 4/8  S/p PEG 4/14 - on TF @ 50 and IVF @ 25  Cocaine abuse  UDS positive for cocaine  Cessation education was provided  Constipation- improving  On bowel regimen  BM 4/11  Sick thyroid- resolved  TSH: 0.307 (Low) Free T4:1.27 (high)  On metoprolol 25 bid  Repeat TSH and FT4 are WNL  IDA  Per Heme/onc recc's : IV Fereheme weekly x2(1/2)  4/8 Iron level low at 21, Ferritin and TIBC wnl   Tobacco abuse  Current smoker  Smoking cessation counselingwill beprovided  Other Stroke Risk Factors Active Problems  Substance abuse - UDS: THC POSITIVEWill  advise patientto stop using due to stroke risk.  Thrombocytosislikely reactive as platelet counts were normal on admission  FH of stroke at early age, per [facility] day # 25  Rosalin Hawking, MD PhD Stroke Neurology 07/29/2020 4:27 PM   To contact Stroke Continuity provider, please refer to [email or web address] After hours, contact General Neurology

## 2020-07-29 NOTE — Progress Notes (Signed)
[facility] PROGRESS NOTE    Brandi Myers  FTD:[id number] DOB: [birth year] DOA: 07/04/2020 PCP: [facility] [place]      Brief Narrative:  Ms Escudero is a 35 y.o. F with HTN, DM and  presented with left hemiparesis.  Per admission H&P: On the day of admission, patient had recently been kicked out of her apartment when she still intermittently and was being picked up by family early in the morning, family thought she seemed "off", but no focal weakness, and fluent without this was anxiety/stress related.  By 11:30 AM on morning of admission she was able to ambulate to the shower, at 1215 however she was found in the bottom of the tub with slurred speech and difficulty moving her left side.  She was not reactivated by EMS, upon arrival in the ER had a doorway examination consistent with right MCA territory infarct.  Head CT showed aspects 9 due to right lentiform nucleus haziness.  Further work-up could not be obtained due to agitation, so the patient was intubated leading to some delay.  CTA showed a right ICA occlusion as well as right common carotid bifurcation thrombus extending into the external carotid.  Interventional radiology accepted the patient for transfer for emergent intervention after starting the patient on tPA."   3/23: Admitted from [facility], cerebral angiogram with endovascular revascularization of occluded right ICA 3/24: Right upper extremity thrombectomy 3/25: Right craniectomy due to malignant cerebral edema with placement of bone flap in the abdomen 3/31: extubated 4/7: TEE completed, unremarkable 4/8: ID consulted for ?endocarditis, which was ruled out 4/12: Heme consulted for thrombocytosis 4/14: PEG placed 4/17: Transferred to hospitalist service      Assessment & Plan:  Right MCA embolic stroke Admitted and underwent endovascular intervention.  Developed cerebral edema requiring craniectomy.    -Continue  atorvastatin -Continue apixaban given right hand embolism    SIRS criteria without clear source  4/15 developed fever to 100.17F again, as well as tachycardia and leukocytosis.    Unclear source.  I do not think she appears septic.  Urine culture and blood cultures from 4/15 without growth, CXR from 4/15 unremarkable.  The right hand does not clearly look infected, the belly exam is nonfocal. - Hold Rocephin and monitor fever, WBC, and HR   Sinus tachycardia HR obviously high since craniectomy, but had stabilized in 90s-100s until about the last three days back to 110s-120s.   leukocytosis Thrombocytosis  Arterial thrombosis right hand S/p Thrombectomy by Dr. 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   Here, glucoses have mostly been controlled on current regimen lately -Continue Levelmir -Continue SS corrections -Continue gabapentin   Anemia of critical illness Hgb stable, no bleeding  Hyponatremia Mild, asymptomatic       Disposition: Status is: Inpatient  Remains inpatient appropriate because:Altered mental status and Unsafe d/c plan   Dispo: The patient is from: Home              Anticipated d/c is to: SNF              Patient currently is not medically stable to d/c.   Difficult to place patient Yes       Level of care: Progressive       MDM: The below labs and imaging reports were reviewed and summarized above.  Medication management as above.   DVT prophylaxis:  apixaban (  ELIQUIS) tablet 5 mg  Code Status: FULL Family Communication:            Subjective: No new fever, complains that she wants to go home.  No focal pain, respiratory symptoms, abdominal symptoms.  She denies any symptoms, just has to go home.  She has left-sided neglect.  Objective: Vitals:   07/28/20 2311 07/29/20 0006 07/29/20 0337 07/29/20 1400  BP:    111/82 (!) 124/97  Pulse: (!) 106   (!) 106  Resp: _0 Temp: 100.2 F (37.9 C) 98.8 F (37.1 C) 98.5 F (36.9 C) 98.4 F (36.9 C)  TempSrc:  Oral Axillary Oral  SpO2: 97%  99% 96%  Weight:      Height:        Intake/Output Summary (Last 24 hours) at 07/29/2020 1707 Last data filed at 07/29/2020 0655 Gross per 24 hour  Intake 820 ml  Output 475 ml  Net 345 ml   Filed Weights   07/21/20 0412 07/23/20 0541 07/27/20 0527  Weight: 70.7 kg 69.7 kg 72.8 kg    Examination: General appearance:  adult female, alert and in moderate distress from anxiety.  Appears debilitated in bed HEENT: Craniectomy scar clean dry and intact anicteric, conjunctiva pink, lids and lashes normal. No nasal deformity, discharge, epistaxis.  Lips moist, dentition poor repair.   Skin: Warm and dry.  No jaundice.  No suspicious rashes or lesions. Cardiac: Tachycardic, regular, nl S1-S2, no murmurs appreciated.  Capillary refill is brisk.  JVP normal  No LE edema.  Radial pulses 2+ and symmetric. Respiratory: Normal respiratory rate and rhythm.  CTAB without rales or wheezes. Abdomen: Abdomen soft.  No TTP or guarding. No ascites, distension, hepatosplenomegaly.   MSK: No deformities or effusions.  Moderate loss of subcutaneous muscle mass and fat Neuro: Awake and alert to me, responds to questions, but disoriented.  Sided gaze preference, left-sided hemiparesis, speech seems fluent but she is confused    Psych: Sensorium intact and responding to questions, attention diminished. Affect anxious.  Judgment and insight appear severely impaired    Data Reviewed: I have personally reviewed following labs and imaging studies:  CBC: Recent Labs  Lab 07/25/20 0633 07/26/20 0429 07/27/20 0225 07/28/20 0206 07/29/20 0159  WBC 11.1* 11.0* 21.4* 27.0* 21.2*  HGB 9.6* 9.6* 10.1* 9.6* 9.2*  HCT 34.6* 34.2* 36.3 34.6* 32.0*  MCV 78.1* 77.7* 78.7* 80.5 79.6*  PLT 830* 775* 742* 519* 710*   Basic Metabolic  Panel: Recent Labs  Lab 07/24/20 0451 07/26/20 0253 07/27/20 0225 07/28/20 0206 07/29/20 0159  NA 138 137 137 134* 133*  K 4.1 3.9 4.4 4.2 3.7  CL 100 100 98 94* 95*  CO2 _1 GLUCOSE 116* 63* 190* 151* 115*  BUN _2 CREATININE 0.53 0.48 0.57 0.55 0.51  CALCIUM 9.7 9.7 9.9 9.8 9.5   GFR: Estimated Creatinine Clearance: 98.1 mL/min (by C-G formula based on SCr of 0.51 mg/dL). Liver Function Tests: No results for input(s): AST, ALT, ALKPHOS, BILITOT, PROT, ALBUMIN in the last 168 hours. No results for input(s): LIPASE, AMYLASE in the last 168 hours. No results for input(s): AMMONIA in the last 168 hours. Coagulation Profile: No results for input(s): INR, PROTIME in the last 168 hours. Cardiac Enzymes: No results for input(s): CKTOTAL, CKMB, CKMBINDEX, TROPONINI in the last 168 hours. BNP (last 3 results) No results for input(s): PROBNP in the last 8760 hours. HbA1C: No results for  input(s): HGBA1C in the last 72 hours. CBG: Recent Labs  Lab 07/29/20 0335 07/29/20 0800 07/29/20 1228 07/29/20 1405 07/29/20 1622  GLUCAP 117* 156* 153* 176* 210*   Lipid Profile: No results for input(s): CHOL, HDL, LDLCALC, TRIG, CHOLHDL, LDLDIRECT in the last 72 hours. Thyroid Function Tests: No results for input(s): TSH, T4TOTAL, FREET4, T3FREE, THYROIDAB in the last 72 hours. Anemia Panel: No results for input(s): VITAMINB12, FOLATE, FERRITIN, TIBC, IRON, RETICCTPCT in the last 72 hours. Urine analysis:    Component Value Date/Time   COLORURINE AMBER (A) 07/28/2020 0009   APPEARANCEUR CLOUDY (A) 07/28/2020 0009   LABSPEC 1.023 07/28/2020 0009   PHURINE 7.0 07/28/2020 0009   GLUCOSEU NEGATIVE 07/28/2020 0009   HGBUR NEGATIVE 07/28/2020 0009   BILIRUBINUR NEGATIVE 07/28/2020 0009   KETONESUR NEGATIVE 07/28/2020 0009   PROTEINUR NEGATIVE 07/28/2020 0009   NITRITE NEGATIVE 07/28/2020 0009   LEUKOCYTESUR TRACE (A) 07/28/2020 0009   Sepsis  Labs: _0 (procalcitonin:4,lacticacidven:4)  ) Recent Results (from the past 240 hour(s))  Culture, blood (Routine X 2) w Reflex to ID Panel     Status: None (Preliminary result)   Collection Time: 07/27/20  3:32 PM   Specimen: BLOOD  Result Value Ref Range Status   Specimen Description BLOOD SITE NOT SPECIFIED  Final   Special Requests   Final    BOTTLES DRAWN AEROBIC ONLY Blood Culture results may not be optimal due to an inadequate volume of blood received in culture bottles   Culture   Final    NO GROWTH < 24 HOURS Performed at [facility], [place] [address] [place], [place] [address]    Report Status PENDING  Incomplete  Culture, blood (Routine X 2) w Reflex to ID Panel     Status: None (Preliminary result)   Collection Time: 07/27/20  3:36 PM   Specimen: BLOOD  Result Value Ref Range Status   Specimen Description BLOOD SITE NOT SPECIFIED  Final   Special Requests   Final    BOTTLES DRAWN AEROBIC ONLY Blood Culture results may not be optimal due to an inadequate volume of blood received in culture bottles   Culture   Final    NO GROWTH < 24 HOURS Performed at [facility], [place] [address], [place], [place] [address]    Report Status PENDING  Incomplete  Culture, Urine     Status: None   Collection Time: 07/28/20  1:00 PM   Specimen: Urine, Random  Result Value Ref Range Status   Specimen Description URINE, RANDOM  Final   Special Requests NONE  Final   Culture   Final    NO GROWTH Performed at [facility], [place] [address] [place], [place] [address]    Report Status 07/29/2020 FINAL  Final         Radiology Studies: CT ANGIO CHEST PE W OR WO CONTRAST  Result Date: 07/28/2020 CLINICAL DATA:  Pulmonary embolism post treatment. EXAM: CT ANGIOGRAPHY CHEST WITH CONTRAST TECHNIQUE: Multidetector CT imaging of the chest was performed using the standard protocol during bolus administration of intravenous contrast. Multiplanar CT image  reconstructions and MIPs were obtained to evaluate the vascular anatomy. CONTRAST:  121m OMNIPAQUE IOHEXOL 350 MG/ML SOLN COMPARISON:  06/26/2007 FINDINGS: Cardiovascular: Heart size is normal. No pericardial effusion. No significant atherosclerotic calcification coronary artery 9 for thoracic aorta. No aneurysm. The pulmonary arteries are well opacified and there is no evidence for acute pulmonary embolus. Mediastinum/Nodes: The visualized portion of the thyroid  gland has a normal appearance. No significant mediastinal, hilar, or axillary adenopathy. Esophagus is unremarkable. Lungs/Pleura: Small bilateral pleural effusions and minimal bibasilar atelectasis. Airways are patent. Upper Abdomen: Liver is diffusely low attenuation, consistent with fatty infiltration. Cholecystectomy. Musculoskeletal: No chest wall abnormality. No acute or significant osseous findings. Review of the MIP images confirms the above findings. IMPRESSION: 1. Technically adequate exam showing no acute pulmonary embolus. 2. Small bilateral pleural effusions and minimal bibasilar atelectasis. 3. Hepatic steatosis. 4. Cholecystectomy. Electronically Signed   By: Nolon Nations M.D.   On: 07/28/2020 12:42        Scheduled Meds: . apixaban  5 mg Per Tube BID  . atorvastatin  40 mg Per Tube q1800  . chlorhexidine gluconate (MEDLINE KIT)  15 mL Mouth Rinse BID  . Chlorhexidine Gluconate Cloth  6 each Topical Daily  . docusate  100 mg Per Tube BID  . gabapentin  600 mg Per Tube Q8H  . insulin aspart  0-15 Units Subcutaneous Q4H  . insulin aspart  6 Units Subcutaneous Q4H  . insulin detemir  35 Units Subcutaneous BID  . mouth rinse  15 mL Mouth Rinse q12n4p  . melatonin  3 mg Per Tube QHS  . metoprolol tartrate  50 mg Per Tube BID  . multivitamin with minerals  1 tablet Per Tube Daily  . polyethylene glycol  17 g Per Tube Daily  . sodium chloride flush  10-40 mL Intracatheter Q12H   Continuous Infusions: . sodium chloride  Stopped (07/13/20 1028)  . sodium chloride Stopped (07/26/20 1046)  . cefTRIAXone (ROCEPHIN)  IV 2 g (07/29/20 1655)  . feeding supplement (GLUCERNA 1.5 CAL) 1,000 mL (07/29/20 1109)  . ferumoxytol Stopped (07/24/20 2152)     LOS: 25 days    Time spent: 35  minutes    Edwin Dada, MD [facility] 07/29/2020, 5:07 PM     Please page though [place] or Epic secure chat:  For [employer], Adult nurse

## 2020-07-29 NOTE — TOC Progression Note (Signed)
Transition of Care [facility]) - Progression Note    Patient Details  Name: Brandi Myers MRN: [id number] Date of Birth: 20-Jul-1985  Transition of Care [facility]) CM/SW Contact  Lorri Frederick, LCSW Phone Number: 07/29/2020, 11:37 AM  Clinical Narrative:   CSW called [facility]achlan to ask about authorization and was informed that no one from the admissions office is working today.     Expected Discharge Plan: Skilled Nursing Facility Barriers to Discharge: Continued Medical Work up,Insurance Authorization  Expected Discharge Plan and Services Expected Discharge Plan: Skilled Nursing Facility       Living arrangements for the past 2 months: Single Family Home                                       Social Determinants of Health (SDOH) Interventions    Readmission Risk Interventions No flowsheet data found.

## 2020-07-29 NOTE — Plan of Care (Signed)
  Problem: Clinical Measurements: Goal: Diagnostic test results will improve Outcome: Progressing   Problem: Clinical Measurements: Goal: Will remain free from infection Outcome: Progressing   Problem: Health Behavior/Discharge Planning: Goal: Ability to manage health-related needs will improve Outcome: Progressing   Problem: Education: Goal: Knowledge of General Education information will improve Description: Including pain rating scale, medication(s)/side effects and non-pharmacologic comfort measures Outcome: Progressing   

## 2020-07-30 DIAGNOSIS — I63411 Cerebral infarction due to embolism of right middle cerebral artery: Secondary | ICD-10-CM

## 2020-07-30 DIAGNOSIS — D75839 Thrombocytosis, unspecified: Secondary | ICD-10-CM | POA: Diagnosis not present

## 2020-07-30 DIAGNOSIS — I10 Essential (primary) hypertension: Secondary | ICD-10-CM | POA: Diagnosis not present

## 2020-07-30 DIAGNOSIS — D72829 Elevated white blood cell count, unspecified: Secondary | ICD-10-CM | POA: Diagnosis not present

## 2020-07-30 DIAGNOSIS — I749 Embolism and thrombosis of unspecified artery: Secondary | ICD-10-CM | POA: Diagnosis not present

## 2020-07-30 LAB — CBC
HCT: 35 % — ABNORMAL LOW (ref 36.0–46.0)
Hemoglobin: 9.9 g/dL — ABNORMAL LOW (ref 12.0–15.0)
MCH: 23 pg — ABNORMAL LOW (ref 26.0–34.0)
MCHC: 28.3 g/dL — ABNORMAL LOW (ref 30.0–36.0)
MCV: 81.4 fL (ref 80.0–100.0)
Platelets: 458 10*3/uL — ABNORMAL HIGH (ref 150–400)
RBC: 4.3 MIL/uL (ref 3.87–5.11)
RDW: 25.9 % — ABNORMAL HIGH (ref 11.5–15.5)
WBC: 14.8 10*3/uL — ABNORMAL HIGH (ref 4.0–10.5)
nRBC: 0 % (ref 0.0–0.2)

## 2020-07-30 LAB — BASIC METABOLIC PANEL
Anion gap: 10 (ref 5–15)
BUN: 11 mg/dL (ref 6–20)
CO2: 30 mmol/L (ref 22–32)
Calcium: 9.6 mg/dL (ref 8.9–10.3)
Chloride: 95 mmol/L — ABNORMAL LOW (ref 98–111)
Creatinine, Ser: 0.59 mg/dL (ref 0.44–1.00)
GFR, Estimated: >60 mL/min (ref >60)
Glucose, Bld: 113 mg/dL — ABNORMAL HIGH (ref 70–99)
Potassium: 4.9 mmol/L (ref 3.5–5.1)
Sodium: 135 mmol/L (ref 135–145)

## 2020-07-30 LAB — GLUCOSE, CAPILLARY
Glucose-Capillary: 120 mg/dL — ABNORMAL HIGH (ref 70–99)
Glucose-Capillary: 148 mg/dL — ABNORMAL HIGH (ref 70–99)
Glucose-Capillary: 149 mg/dL — ABNORMAL HIGH (ref 70–99)
Glucose-Capillary: 222 mg/dL — ABNORMAL HIGH (ref 70–99)
Glucose-Capillary: 166 mg/dL — ABNORMAL HIGH (ref 70–99)
Glucose-Capillary: 197 mg/dL — ABNORMAL HIGH (ref 70–99)

## 2020-07-30 LAB — PROTHROMBIN GENE MUTATION

## 2020-07-30 NOTE — Progress Notes (Addendum)
STROKE TEAM PROGRESS NOTE   INTERVAL HISTORY On exam this AM patient is awake and oriented. Patient reports that she feels ok and ask for her bed to be lowered for comfort. RN is in room and reports that patient was reporting that she was thirsty earlier in the morning and had been doing well. RN reports that overnight patient did have some episodes of disorientation and shouting but this resolved by morning. Patient was redirectable overnight per RN.   Vitals:   07/29/20 2339 07/30/20 0358 07/30/20 0443 07/30/20 0717  BP:  123/88  123/86  Pulse:      Resp: 18 (!) 25 15 20   Temp:  98.4 F (36.9 C)  99.2 F (37.3 C)  TempSrc:  Oral  Oral  SpO2:  98%  97%  Weight:      Height:       CBC:  Recent Labs  Lab 07/29/20 0159 07/30/20 0525  WBC 21.2* 14.8*  HGB 9.2* 9.9*  HCT 32.0* 35.0*  MCV 79.6* 81.4  PLT 495* 361*   Basic Metabolic Panel:  Recent Labs  Lab 07/29/20 0159 07/30/20 0525  NA 133* 135  K 3.7 4.9  CL 95* 95*  CO2 28 30  GLUCOSE 115* 113*  BUN 9 11  CREATININE 0.51 0.59  CALCIUM 9.5 9.6   IMAGING past 24 hours No results found.  PHYSICAL EXAM  General: caucasian female, lying in bed with R leg on incline cushion  Lungs: Symmetrical Chest rise, no labored breathing  Cardio: Regular Rate and Rhythm  Abdomen: Soft, non-tender  Skin:R handexposed today, cooler to touch. Continues to have darkening of the tips of the thumb, and first two fingers.  Eschar of thenar prominence remains stable.  Neuro -Alert oriented to person, place, and time. Patient is verbal and speech is clear and coherent with appropriate volume. Comprehension is intact.Patient continues to have right gaze preference but able to cross midline today, was able to maintain gaze to the left a bit longer. Patient able to clarify that she has sensation in her Left V1-V3 but it is less than right.  Left facial droop. Tongue midline.RUE 4+/5 and RLE purposefulmovement against gravity  (3/5)able to wiggle toes and LUE flaccid/ atonic with external rotation. LLEexternally rotated. Sensation:LLE and RLE not intact to light touch or noxious stimuli. Extinction is positive, as patient does has preference to the right on light touch.  ASSESSMENT/PLAN Ms.Contessa L Priceis a 35 y.o.femalewith history of uncontrolled diabetesandhypertensionpresenting with left sided weakness and slurred speech.Per family there is a complicated social situation at this time. She lives intermittently in an apartment by herself but was kicked out and they went to pick her up at 5:30 AM. At that point she seemed "off"and generally quiet but did not seem to have any difficulty with left-sided weakness.Family attributed this to psychosocial issues and potential substance use noting that her sister passed away from an overdose in October. Later in the morning she complained of diffuse body pain and was administered gabapentin and a muscle relaxer.At 11:30 AM she stood up and walkedto the showerand at1215 she was found on the bottom of the tub with slurred speech and difficulty moving the left side. She was not preactivated by EMS, but on arrival in the ED clearly appear to have a doorway examination consistent with a right MCA territory infarct.CTA revealed right ICA occlusion as well as a right common carotid bifurcation thrombus extending into the external carotid (felt to be the source of the  occlusion). CT perfusion imaging demonstrated possible core infarct of 51 mL (though notably this was obtained within 2 hours of symptom onset and therefore may reflect an overestimate of the true core). Mismatch ratio was 2 with a penumbra of 63 mL. Case was discussed with Dr. Estanislado Pandy of neuro interventional radiology and Dr. Cheral Marker of [facility] and the patient was accepted for transfer to [facility] for emergent intervention. She was also started on TPA after consenting the patient's emergency  contact with full discussion of risks of bleeding and importance of accurately determining the patient's last known well.Ms. Jeanann Lewandowsky confirm the last known well as described above and agreed that the patient would want TPA given the risk/benefit.She confirmed that the patient had no recent procedures or other contraindications to TPA.Regarding her baseline, she is functional at baseline and family believes she is smoking at [place] a day currently. She is on Metformin for her diabetes, does not take oral contraceptive pills, has had no recent surgeries or procedures. PatientPEG has been placed successfully; however patient leukocytosis worsened overnight to 21.4. This may be a reaction to recent surgery will continue to monitor. Test orderd by Heme/onc continue to indicate that the patient is responding to chronic inflammation likely reaction to prior procedures. On neurologic exam patient was able to cross midline today for the first time. Unfortunately patient continues to have complete neglect of the L arm and it is externally rotated in the splint. Patient did not have movement in her Left leg but she has had varying degrees of recognition to the LLE throughout hospitalization. Patient continued to report chest pain this AM and was able to specifically point to her chest this AM. EKG was ordered but was not concerning for ACS. Troponin levels were also ordered but remain pending. Patient PEG appears to be functioning well will transition patient from IV heparin to Eliquis. On exam this AM patient neurological exam remains stable. Patient appears to have some improvement in maintenance of gaze to the left. Patient continues to have neglect of the LUE and no sensation or movement in the LLE. Patient leukocytosis has improved and patient has been afebrile for the last 24h.   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   CT no acute finding but suspicious for right MCA early ischemic changes.   CTA head and neck showed right ICA terminal and proximal MCA occlusion, right CCA nonocclusive thrombus.  CT perfusion showed large penumbra.   Status post thrombectomy with right terminal ICA, right PCA and right ACA TICI2breperfusion however does have distal micro emboli.  MRI showed large right MCA infarct with 98m MLS and hemorrhagic transformation.   MRA showed patent intracranial vessels.  CT Head WO Contrast 3/26 620 - Improved midline shift and normalized left lateral ventricular volume after decompressive craniectomy. No progressive infarct or hemorrhage.  2D Echo- EF: 60-65%. No wall motion abnormality.  TCD Bubble - No PFO  TEE today no source of emboli, no PFO  DVT [place]-No evidence of DVT bilaterally  LDL99  HgbA1c10.6  VTE prophylaxis -eliquis  Now on Eliquis 566mBID, transition from heparin 4/16  Therapy recommendations:CIR however no family support so SNF is working plan  Disposition:SNF due to lack of family support, will reconsult CIR  as there has been difficulty in finding a SNF to accept patient due to ageand + Cocaine on admission UDS  Cerebral edema, resolved  CT Headrepeat3/24 -Continued interval evolution of right MCA distribution infarct with associated hemorrhagic transformation. Worsened regional mass effect and edema with increased right-to-left midline shift now measuring up to 9 mm.Increased dilatation of the right lateral ventricle concerning for developing ventricular trapping. No other new acute intracranial abnormality.  CT repeat 3/25 unchangedright cerebral cytotoxic edema with central hemorrhage. Midline shift is  similar at 1 cm.Unchanged left lateral ventricle dilatation with evidence of transependymal flow.  S/pdecompressive hemicraniectomy (3/25)with Dr. Vertell Limber  CT Head WO Contrast 3/26 620 - Improved midline shift and normalized left lateral ventricular volume after decompressive craniectomy. No progressive infarct or hemorrhage.  Keppra discontinued  Na 137->141->147->150>158>150> 141>139>140>139>138>137-- stable  Staple removal completed on 4/8  Fever,- improving Leukocytosis, - improving  Tmax 101.7->102.5> 98.6> 98.7>99.9>afebrile>>>100.8 (4/14)>100.9> afebrile  WBC -16.2>15.7>16.8>18.6->21.5->19.6->14.8>13.7>12.9>12.5>11.1>11.0>21.4>27>>14.8  TEE no endocarditis  UA (4/3) neg  CXR4/4-left perihilar atelectasis  CXR 4/7 No acute findings   Abd Xray 4/7 No acute findings  Respiratory Culture (3/26) - Strep Pneumoniae  Blood CxNo growth after5days drawn 3/25  Completed 7 day course of Ceftriaxone on 4/2  ID followed patient did not think endocarditis.  New fever post-op :4/14 ? UA: negative ? CXR: no acute abnormalites  ? Hospitalist became Primary 4/18  Tachycardia  Leukocytosis  WBC -16.2>15.7>16.8>18.6->21.5->19.6->14.8>13.7>12.9>12.5>11.1>11>21.4>27>> 14.8  Likely related to ischemic right hand  on metoprolol 77m bid   UA: negative  LE venous doppler repeat (4/3): No evidence of deep vein thrombosis   Recent EKG sinus tachycardia  transfer to IM Hospitalist, appreciate help  Acute onset Chest Pain  EKG: Sinus tachy cardia  Troponin 4->4  CXR: no acute abnormalities  CTA : negative for PE, 4/16  R critical limb ischemia  S/p thrombectomy (3/24) for right brachial artery occlusion  Now Right hand pain, cool to touch, dry eschar wound on the palmar region, decreased dopplers per RN, and tachycardia, tachypnea with leukocytosis  UE arterial duplex (4/3) - right ulnar artery occlusion.   VVS Dr. CCarlis Abbott- No surgical  intervention. Heparin infusion> Eliquis  Discussed with Dr. JRonnald Rampwith Neurosurgery ok'd the initiation of Heparin  Heparin discontinued 4/15  Transitioned to Eliquis 54mBID, 4/15  Percocet 5/325 q 6hr prn for right hand pain  Hyperlipidemia  Home meds:none  LDL 99, goal < 70  On lipitor 40 now  Continue statin at discharge  Poorly controlled Diabetes type II, with hyperglycemia   Home meds:Metformin, Glargine, Aspart  HgbA1c 10.6, goal < 7.0  CBGs  SSI  Dysphagia  Feeding via cortrak  SLP following, NPOremains in place , patient failed swallow study today  S/p Cortrak 4/8 -wason TF @ 50 and IVF @ 25  PEG tube is placed 4/15  Cocaine abuse  UDS positive for cocaine  Cessation education will be provided  Constipation- improving  On bowel regimen  BM 4/11  Sick thyroid- resolved  TSH: 0.307 (Low) Free T4:1.27 (high)  On metoprolol 25 bid  Repeat TSH and FT4 are WNL  Iron deficiency anemia and Thrombocytosis, improving  4/8 Iron level low at 21, Ferritin and TIBC wnl   Plt 915> 830> 775> 742> 519->458  CRP (2.9)and ESR (92)  Cardioliopin ab IgM elevated (15), B2-glycoprotein IgA elevated (31), ANA ab (-)  Likely reactive to anemia  IV feraheme weekly x2 per heme/onc recc's   Tobacco abuse  Current smoker  Smoking  cessation counselingwill beprovided  Other Stroke Risk Factors Active Problems  Substance abuse - UDS: THC POSITIVEWill advise patientto stop using due to stroke risk.  FH of stroke at early age, per [place]  Neurology will follow from [place], please reach out with any concerns or questions.   Hospital day # 77  Damita Dunnings, MD PGY-1   ATTENDING NOTE: I reviewed above note and agree with the assessment and plan. Pt was seen and examined.   Patient lying in bed, no acute event overnight, no neuro changes.  Patient leukocytosis much improved, today WBC 14.8.  Thrombocytosis and  anemia also improving.  No fever overnight, still has mild tachycardia.  PEG tube site no leaking.  On Eliquis and tolerating well.  Pending SNF placement.  For detailed assessment and plan, please refer to above as I have made changes wherever appropriate.   Neurology will follow up peripherally. Please feel free to call with any questions. Pt will follow up with stroke clinic Dr. Leonie Man at [facility] in about 4 weeks after discharge.   Rosalin Hawking, MD PhD Stroke Neurology 07/30/2020 3:24 PM     To contact Stroke Continuity provider, please refer to [email or web address] After hours, contact General Neurology

## 2020-07-30 NOTE — TOC Progression Note (Signed)
Transition of Care [facility]) - Progression Note    Patient Details  Name: Brandi Myers MRN: [id number] Date of Birth: 01-Aug-1985  Transition of [facility] CM/SW Contact  Baldemar Lenis, [place] Phone Number: 07/30/2020, 4:31 PM  Clinical Narrative:   CSW attempted to reach [facility] several times today to ask about progress on auth request, and there was no answer from [place] all day. CSW to follow.    Expected Discharge Plan: Skilled Nursing Facility Barriers to Discharge: Continued Medical Work up,Insurance Authorization  Expected Discharge Plan and Services Expected Discharge Plan: Skilled Nursing Facility       Living arrangements for the past 2 months: Single Family Home                                       Social Determinants of Health (SDOH) Interventions    Readmission Risk Interventions No flowsheet data found.

## 2020-07-30 NOTE — Progress Notes (Addendum)
Physical Therapy Treatment Patient Details Name: Brandi Myers MRN: [id number] DOB: Jul 22, 1985 Today's Date: 07/30/2020    History of Present Illness 35 yo female admitted with L sided weakness givent TPA for R MCA involving basal ganglia frontal temporal lobes  3/23 s/p BIL revascularization  R MCA ACA achieving 2B revascularization PT intubated starting 3/23 . 3/24 R UE thromectomy CT reveals worsening midline shift start 3% saline 3/25 decompressive hemicranitectomy R side bone flap in abdomen 3/25 fever 103. Extubated 3/31. PMH current smoker uncontrolled DM HTn    PT Comments    Pt more alert and participating in today's therapy session. She required mod/max assist bed mobility and mod assist to maintain sitting balance EOB. Pt tolerated sitting x 15 minutes. Following simple commands 95% of time. Fatigues quickly requiring return to supine at end of session.     Follow Up Recommendations  CIR (if CIR denies, will need SNF)     Equipment Recommendations  Wheelchair (measurements PT);Wheelchair cushion (measurements PT);Hospital bed    Recommendations for Other Services       Precautions / Restrictions Precautions Precautions: Fall;Other (comment) Precaution Comments: bone flap out in R abdomen, L hemiparesis, PEG, helmet with activity    Mobility  Bed Mobility Overal bed mobility: Needs Assistance Bed Mobility: Rolling;Sidelying to Sit;Sit to Sidelying Rolling: Mod assist Sidelying to sit: +2 for physical assistance;+2 for safety/equipment;Mod assist     Sit to sidelying: Max assist;+2 for physical assistance;+2 for safety/equipment General bed mobility comments: assist with LLE and trunk, cues for sequencing, increased time    Transfers                 General transfer comment: unable to progress beyond EOB due to fatigue  Ambulation/Gait                 Stairs             Wheelchair Mobility    Modified Rankin (Stroke Patients  Only) Modified Rankin (Stroke Patients Only) Pre-Morbid Rankin Score: No symptoms Modified Rankin: Severe disability     Balance Overall balance assessment: Needs assistance Sitting-balance support: Feet supported;Single extremity supported Sitting balance-Leahy Scale: Poor Sitting balance - Comments: mod assist ot maintain sitting balance EOB. Pt tolerated sitting x 15 minutes. Postural control: Left lateral lean                                  Cognition Arousal/Alertness: Awake/alert Behavior During Therapy: Flat affect Overall Cognitive Status: Impaired/Different from baseline Area of Impairment: Attention;Orientation;Following commands;Problem solving;Memory;Safety/judgement;Awareness                 Orientation Level: Disoriented to;Place;Situation Current Attention Level: Sustained Memory: Decreased short-term memory;Decreased recall of precautions Following Commands: Follows one step commands consistently;Follows one step commands with increased time Safety/Judgement: Decreased awareness of deficits;Decreased awareness of safety Awareness: Intellectual Problem Solving: Slow processing;Decreased initiation;Difficulty sequencing;Requires verbal cues;Requires tactile cues General Comments: cues to stay on task, fatigues quickly      Exercises      General Comments General comments (skin integrity, edema, etc.): Resting HR 109. Max HR sitting 125.      Pertinent Vitals/Pain Pain Assessment: Faces Faces Pain Scale: No hurt    Home Living                      Prior Function  PT Goals (current goals can now be found in the care plan section) Acute Rehab PT Goals Patient Stated Goal: "I want to go home to my kids." Progress towards PT goals: Progressing toward goals    Frequency    Min 3X/week      PT Plan Discharge plan needs to be updated    Co-evaluation              AM-PAC PT "6 Clicks" Mobility    Outcome Measure  Help needed turning from your back to your side while in a flat bed without using bedrails?: A Lot Help needed moving from lying on your back to sitting on the side of a flat bed without using bedrails?: A Lot Help needed moving to and from a bed to a chair (including a wheelchair)?: Total Help needed standing up from a chair using your arms (e.g., wheelchair or bedside chair)?: Total Help needed to walk in hospital room?: Total Help needed climbing 3-5 steps with a railing? : Total 6 Click Score: 8    End of Session Equipment Utilized During Treatment: Other (comment) (helmet) Activity Tolerance: Patient tolerated treatment well Patient left: in bed;with call bell/phone within reach;with bed alarm set Nurse Communication: Mobility status PT Visit Diagnosis: Other abnormalities of gait and mobility (R26.89);Hemiplegia and hemiparesis;Other symptoms and signs involving the nervous system (R29.898);Muscle weakness (generalized) (M62.81);Difficulty in walking, not elsewhere classified (R26.2) Hemiplegia - Right/Left: Left Hemiplegia - dominant/non-dominant: Non-dominant Hemiplegia - caused by: Cerebral infarction     Time: 4920-1007 PT Time Calculation (min) (ACUTE ONLY): 26 min  Charges:  $Therapeutic Activity: 8-22 mins $Neuromuscular Re-education: 8-22 mins                     Aida Raider, PT  Office # [phone] Pager [phone]    Ilda Foil 07/30/2020, 11:51 AM

## 2020-07-30 NOTE — Progress Notes (Signed)
PROGRESS NOTE    Brandi Myers  FOY:[id number] DOB: 02/28/1986 DOA: 07/04/2020 PCP: [facility] [place]    Brief Narrative:  Brandi Myers was admitted to the hospital with a working diagnosis of right MCA embolic stroke, complicated with right hand arterial thrombosis and systemic inflammatory response syndrome. Prolonged hospitalization.  Brandi Myers had acute change in her neurologic condition, around 1215 on day of admission she was found on the bottom of the tub with slurred speech and difficulty moving her left side.  In the emergency department at [facility] she was diagnosed with acute right MCA territory infarct.  She had significant agitation, she was sedated and intubated, placed on mechanical ventilation.  CT angiography showed right CEA occlusion as well as right common carotid artery bifurcation thrombus extending into the external carotid.  Transferred to [facility] for IR intervention.  03/23 underwent cerebral angiogram with endovascular revascularization of occluded right ICA. 03/24 right upper extremity thrombectomy.   Brandi Myers developed malignant cerebral edema and required right craniectomy with placement of a bone flap in the abdomen.  Brandi Myers liberated from mechanical ventilation is 3/31.  Work-up underwent for endocarditis which was negative.   Continue to have severe deconditioning, swallow dysfunction, PEG tube was placed 4/14 and transferred to [facility] 4/17.    Assessment & Plan:   Active Problems:   Stroke (cerebrum) (HCC)   Middle cerebral artery embolism, right   Thrombocytosis   Leukocytosis   Arterial thrombosis (HCC)   Type 2 diabetes mellitus (HCC)   Benign essential hypertension   1. Acute ischemic CVA, right ICA thrombus. Positive hemorrhagic conversion with malignant cerebral edema and brain herniation sp hemicraniectomy.  Brandi Myers with prolonged hospitalization, continue to have left sided hemiparesis and swallow dysfunction. SP peg tube  placement.  Continue medical therapy with apixaban, transition from heparin.  Pending transfer to SNF, no family support for CIR.   Statin therapy with atorvastatin 40 mg daily.   2. Acute ischemic right upper extremity sp thrombectomy. Brandi Myers with right ulnar occlusion per [place] 4/3.  Recommendations for continue anticoagulation with apixaban.  Pain control and physical therapy.   3. Polysubstance abuse/ cocaine. No clinical sings of withdrawal, will continue close monitoring.   4. Hyponatremia. Follow on renal function in am, Brandi Myers has been tolerating tube feedings well.   5. T2DM Continue glucose cover and monitoring with insulin sliding scale.  Basal insulin with 35 units bid.   6. HTN blood pressure monitoring. On metoprolol 50 mg po bid.   7. Anemia with iron deficiency cell count has been stable. Continue close monitoring.  Continue iron supplementation with IV formulation   8. UTI (not present on admission) continue antibiotic therapy with ceftriaxone for UTI, cell count has been improving.   Status is: Inpatient  Remains inpatient appropriate because:Inpatient level of care appropriate due to severity of illness   Dispo: The Brandi Myers is from: Home              Anticipated d/c is to: SNF              Brandi Myers currently is not medically stable to d/c.   Difficult to place Brandi Myers No  DVT prophylaxis: apixaban   Code Status:   full  Family Communication:  No family at the bedside      Nutrition Status: Nutrition Problem: Inadequate oral intake Etiology: inability to eat Signs/Symptoms: NPO status Interventions: Tube feeding     Consultants:   Neurology   neurosurgery   Vascular surgery  Procedures:   Right ica and right arm thrombectomy   Peg tube  craniaomy    Subjective: Brandi Myers continue to have left sided hemiparesis, no nausea or vomiting, no chest pain,. Tolerating well tube feedings.  Objective: Vitals:   07/30/20 0358 07/30/20 0443  07/30/20 0717 07/30/20 1138  BP: 123/88  123/86 116/84  Pulse:    (!) 104  Resp: (!) 25 15 20  (!) 22  Temp: 98.4 F (36.9 C)  99.2 F (37.3 C) 97.8 F (36.6 C)  TempSrc: Oral  Oral Oral  SpO2: 98%  97%   Weight:      Height:        Intake/Output Summary (Last 24 hours) at 07/30/2020 1439 Last data filed at 07/29/2020 2202 Gross per 24 hour  Intake --  Output 300 ml  Net -300 ml   Filed Weights   07/21/20 0412 07/23/20 0541 07/27/20 0527  Weight: 70.7 kg 69.7 kg 72.8 kg    Examination:   General: Not in pain or dyspnea. deconditioned  Neurology: Awake and alert, left sided hemiparesis  E ENT: no pallor, no icterus, oral mucosa moist Cardiovascular: No JVD. S1-S2 present, rhythmic, no gallops, rubs, or murmurs. No lower extremity edema. Pulmonary: positive  breath sounds bilaterally, adequate air movement, no wheezing, rhonchi or rales. Gastrointestinal. Abdomen soft and non tender Skin. No rashes Musculoskeletal: no joint deformities     Data Reviewed: I have personally reviewed following labs and imaging studies  CBC: Recent Labs  Lab 07/26/20 0429 07/27/20 0225 07/28/20 0206 07/29/20 0159 07/30/20 0525  WBC 11.0* 21.4* 27.0* 21.2* 14.8*  HGB 9.6* 10.1* 9.6* 9.2* 9.9*  HCT 34.2* 36.3 34.6* 32.0* 35.0*  MCV 77.7* 78.7* 80.5 79.6* 81.4  PLT 775* 742* 519* 495* 683*   Basic Metabolic Panel: Recent Labs  Lab 07/26/20 0253 07/27/20 0225 07/28/20 0206 07/29/20 0159 07/30/20 0525  NA 137 137 134* 133* 135  K 3.9 4.4 4.2 3.7 4.9  CL 100 98 94* 95* 95*  CO2 27 24 25 28 30   GLUCOSE 63* 190* 151* 115* 113*  BUN 7 8 10 9 11   CREATININE 0.48 0.57 0.55 0.51 0.59  CALCIUM 9.7 9.9 9.8 9.5 9.6   GFR: Estimated Creatinine Clearance: 98.1 mL/min (by C-G formula based on SCr of 0.59 mg/dL). Liver Function Tests: No results for input(s): AST, ALT, ALKPHOS, BILITOT, PROT, ALBUMIN in the last 168 hours. No results for input(s): LIPASE, AMYLASE in the last 168  hours. No results for input(s): AMMONIA in the last 168 hours. Coagulation Profile: No results for input(s): INR, PROTIME in the last 168 hours. Cardiac Enzymes: No results for input(s): CKTOTAL, CKMB, CKMBINDEX, TROPONINI in the last 168 hours. BNP (last 3 results) No results for input(s): PROBNP in the last 8760 hours. HbA1C: No results for input(s): HGBA1C in the last 72 hours. CBG: Recent Labs  Lab 07/29/20 1931 07/29/20 2326 07/30/20 0401 07/30/20 0811 07/30/20 1136  GLUCAP 152* 101* 120* 148* 149*   Lipid Profile: No results for input(s): CHOL, HDL, LDLCALC, TRIG, CHOLHDL, LDLDIRECT in the last 72 hours. Thyroid Function Tests: No results for input(s): TSH, T4TOTAL, FREET4, T3FREE, THYROIDAB in the last 72 hours. Anemia Panel: No results for input(s): VITAMINB12, FOLATE, FERRITIN, TIBC, IRON, RETICCTPCT in the last 72 hours.    Radiology Studies: I have reviewed all of the imaging during this hospital visit personally     Scheduled Meds: . apixaban  5 mg Per Tube BID  . atorvastatin  40 mg Per  Tube q1800  . chlorhexidine gluconate (MEDLINE KIT)  15 mL Mouth Rinse BID  . Chlorhexidine Gluconate Cloth  6 each Topical Daily  . docusate  100 mg Per Tube BID  . gabapentin  600 mg Per Tube Q8H  . insulin aspart  0-15 Units Subcutaneous Q4H  . insulin aspart  6 Units Subcutaneous Q4H  . insulin detemir  35 Units Subcutaneous BID  . mouth rinse  15 mL Mouth Rinse q12n4p  . melatonin  3 mg Per Tube QHS  . metoprolol tartrate  50 mg Per Tube BID  . multivitamin with minerals  1 tablet Per Tube Daily  . polyethylene glycol  17 g Per Tube Daily  . sodium chloride flush  10-40 mL Intracatheter Q12H   Continuous Infusions: . sodium chloride Stopped (07/13/20 1028)  . sodium chloride Stopped (07/26/20 1046)  . cefTRIAXone (ROCEPHIN)  IV Stopped (07/29/20 1725)  . feeding supplement (GLUCERNA 1.5 CAL) 1,000 mL (07/30/20 0547)  . ferumoxytol Stopped (07/24/20 2152)      LOS: 26 days        Leiyah Maultsby Gerome Apley, MD

## 2020-07-30 NOTE — Plan of Care (Signed)
  Problem: Clinical Measurements: Goal: Respiratory complications will improve Outcome: Progressing   Problem: Activity: Goal: Risk for activity intolerance will decrease Outcome: Progressing   Problem: Nutrition: Goal: Adequate nutrition will be maintained Outcome: Progressing   

## 2020-07-30 NOTE — Progress Notes (Signed)
  Speech Language Pathology Treatment: Dysphagia;Cognitive-Linquistic  Patient Details Name: Brandi Myers MRN: [id number] DOB: 06-Apr-1986 Today's Date: 07/30/2020 Time: 1010-1020 SLP Time Calculation (min) (ACUTE ONLY): 10 min  Assessment / Plan / Recommendation Clinical Impression  Pt demonstrated improved attention to PO today with decreased severity of coughing. Appeared to tolerate sips of honey thick liquids well. Increased coughing after puree given. Pt able to direct attention to midline and left with min verbal cues today, though could not sustain. Self feeding would be helpful, but pt unable to grasp spoon. Recommend MBS tomorrow to reassess swallowing given signs of improvement.   HPI HPI: 35 yo female admitted with L sided weakness, given TPA for R MCA involving basal ganglia, frontal, temporal lobes  3/23 s/p BIL revascularization  R MCA ACA achieving 2B revascularization. Pt intubated 3/23-31. 3/24 R UE thrombectomy. CT revealed worsening midline shift. 3/25 decompressive hemicranitectomy, R side bone flap in abdomen. PMH current smoker uncontrolled DM HTn      SLP Plan  MBS;Continue with current plan of care       Recommendations                   Oral Care Recommendations: Oral care BID Follow up Recommendations: Skilled Nursing facility Plan: MBS;Continue with current plan of care       GO               Harlon Ditty, MA CCC-SLP  Acute Rehabilitation Services Pager [phone] Office [phone]  Claudine Mouton 07/30/2020, 1:42 PM

## 2020-07-31 ENCOUNTER — Inpatient Hospital Stay (HOSPITAL_COMMUNITY): Payer: Medicaid Other

## 2020-07-31 DIAGNOSIS — D72829 Elevated white blood cell count, unspecified: Secondary | ICD-10-CM | POA: Diagnosis not present

## 2020-07-31 DIAGNOSIS — I749 Embolism and thrombosis of unspecified artery: Secondary | ICD-10-CM | POA: Diagnosis not present

## 2020-07-31 DIAGNOSIS — D75839 Thrombocytosis, unspecified: Secondary | ICD-10-CM | POA: Diagnosis not present

## 2020-07-31 LAB — GLUCOSE, CAPILLARY
Glucose-Capillary: 157 mg/dL — ABNORMAL HIGH (ref 70–99)
Glucose-Capillary: 192 mg/dL — ABNORMAL HIGH (ref 70–99)
Glucose-Capillary: 212 mg/dL — ABNORMAL HIGH (ref 70–99)
Glucose-Capillary: 228 mg/dL — ABNORMAL HIGH (ref 70–99)
Glucose-Capillary: 139 mg/dL — ABNORMAL HIGH (ref 70–99)
Glucose-Capillary: 135 mg/dL — ABNORMAL HIGH (ref 70–99)

## 2020-07-31 LAB — BASIC METABOLIC PANEL
Anion gap: 11 (ref 5–15)
BUN: 12 mg/dL (ref 6–20)
CO2: 30 mmol/L (ref 22–32)
Calcium: 9.7 mg/dL (ref 8.9–10.3)
Chloride: 97 mmol/L — ABNORMAL LOW (ref 98–111)
Creatinine, Ser: 0.52 mg/dL (ref 0.44–1.00)
GFR, Estimated: >60 mL/min (ref >60)
Glucose, Bld: 156 mg/dL — ABNORMAL HIGH (ref 70–99)
Potassium: 4.2 mmol/L (ref 3.5–5.1)
Sodium: 138 mmol/L (ref 135–145)

## 2020-07-31 LAB — CBC
HCT: 34.8 % — ABNORMAL LOW (ref 36.0–46.0)
Hemoglobin: 9.9 g/dL — ABNORMAL LOW (ref 12.0–15.0)
MCH: 22.9 pg — ABNORMAL LOW (ref 26.0–34.0)
MCHC: 28.4 g/dL — ABNORMAL LOW (ref 30.0–36.0)
MCV: 80.4 fL (ref 80.0–100.0)
Platelets: 390 10*3/uL (ref 150–400)
RBC: 4.33 MIL/uL (ref 3.87–5.11)
RDW: 25.5 % — ABNORMAL HIGH (ref 11.5–15.5)
WBC: 11 10*3/uL — ABNORMAL HIGH (ref 4.0–10.5)
nRBC: 0 % (ref 0.0–0.2)

## 2020-07-31 MED ORDER — FREE WATER
200.0000 mL | Freq: Four times a day (QID) | Status: DC
  Administered 2020-07-31 – 2020-08-03 (×12): 200 mL

## 2020-07-31 NOTE — Progress Notes (Signed)
Modified Barium Swallow Progress Note  Patient Details  Name: Brandi Myers MRN: [id number] Date of Birth: 14-Mar-1986  Today's Date: 07/31/2020  Modified Barium Swallow completed.  Full report located under Chart Review in the Imaging Section.  Brief recommendations include the following:  Clinical Impression  Pt demonstrates some improvement in swallowing , primarily in airway protection, though severe cognitive impairment and oral dysphagia persists. Pt is restless and distractible int he MBS suite (improved attention to PO in room yesterday) and needs max verbal and visual cues to attend to PO. All trials were given with a straw, which seems to be her preferred method of sipping. Given fleeting attention she does not always immediately form, propel and swallow bolus; she looses attention intermittently and there is slow delayed movement. She aspirated sips of nectar with sensation before the swallow, but tolerated honey, puree and regular solids without difficulty and adequate strength throughout. Recommend pt initaite a dys 3 mech soft diet with honey thick liquids. Will f/u for tolerance.   Swallow Evaluation Recommendations       SLP Diet Recommendations: NPO;Dysphagia 3 (Mech soft) solids;Honey thick liquids           Supervision: Full assist for feeding   Compensations: Slow rate;Small sips/bites;Minimize environmental distractions   Postural Changes: Seated upright at 90 degrees   Oral Care Recommendations: Oral care BID   Other Recommendations: Order thickener from pharmacy;Have oral suction available  Harlon Ditty, MA CCC-SLP  Acute Rehabilitation Services Pager [phone] Office [phone]   Claudine Mouton 07/31/2020,10:41 AM

## 2020-07-31 NOTE — TOC Progression Note (Signed)
Transition of Care [facility]) - Progression Note    Patient Details  Name: Brandi Myers MRN: [id number] Date of Birth: April 18, 1985  Transition of [facility]) CM/SW Contact  Baldemar Lenis, [place] Phone Number: 07/31/2020, 12:52 PM  Clinical Narrative:   CSW got Antoinette at [facility] on the phone to ask about insurance approval. Per Sherry Ruffing, insurance was not started last week like they said they would, but they will start it today. CSW confirmed that they have patient's tube feeding supplies on hand, will not need to order. CSW to follow.     Expected Discharge Plan: Skilled Nursing Facility Barriers to Discharge: Continued Medical Work up,Insurance Authorization  Expected Discharge Plan and Services Expected Discharge Plan: Skilled Nursing Facility       Living arrangements for the past 2 months: Single Family Home                                       Social Determinants of Health (SDOH) Interventions    Readmission Risk Interventions No flowsheet data found.

## 2020-07-31 NOTE — Progress Notes (Signed)
Occupational Therapy Treatment Patient Details Name: Brandi Myers MRN: [id number] DOB: Jul 31, 1985 Today's Date: 07/31/2020    History of present illness 35 yo female admitted with L sided weakness givent TPA for R MCA involving basal ganglia frontal temporal lobes  3/23 s/p BIL revascularization  R MCA ACA achieving 2B revascularization PT intubated starting 3/23 . 3/24 R UE thromectomy CT reveals worsening midline shift start 3% saline 3/25 decompressive hemicranitectomy R side bone flap in abdomen 3/25 fever 103. Extubated 3/31. PMH current smoker uncontrolled DM HTn   OT comments  Pt seen in conjunction with PT to maximize pts activity tolerance, and optimize pts participation. Upon arrival, pt stating her name and DOB and agreeable to co treatment, however pt calling out to people not present in room but able to state she was in the hospital when two choices given. Pt continues to present with impaired balance, visual deficits ( strong R gaze preference) decreased activity tolerance, and impaired cognition. Pt currently requires total A +2 for all aspects of bed mobility and MAXA for static sitting balance with heavy L lateral lean. Pt was able to stand to stedy with RUE pulling heavily on bar of stedy to power into standing, MAX A needed for standing balance as pt pushing with RUE to L side. Poor proprioception as pt with no awareness to balance deficits. Pt would continue to benefit from skilled occupational therapy while admitted and after d/c to address the below listed limitations in order to improve overall functional mobility and facilitate independence with BADL participation. DC plan remains appropriate, will follow acutely per POC.    Follow Up Recommendations  SNF    Equipment Recommendations  None recommended by OT    Recommendations for Other Services      Precautions / Restrictions Precautions Precautions: Fall;Other (comment) Precaution Comments: bone flap out in R  abdomen, L hemiparesis, PEG, helmet with activity Restrictions Weight Bearing Restrictions: No       Mobility Bed Mobility Overal bed mobility: Needs Assistance Bed Mobility: Supine to Sit     Supine to sit: Total assist;+2 for physical assistance;+2 for safety/equipment     General bed mobility comments: total A +2 for all aspects of bed mobility    Transfers Overall transfer level: Needs assistance Equipment used: Ambulation equipment used Transfers: Sit to/from Stand Sit to Stand: Total assist;+2 physical assistance;+2 safety/equipment         General transfer comment: Stedy set up in front of pt, and bil UEs on stedy bar with assist for LUE; as PT and COTA were planning inital standing attempt, noted pt made solid effort to power up to stand impulsively; RLE with good push, and able to clear hips from bed and come to near fully upright stand with extremely heavy L lean; multimodal cueing to sit down to high stedy seat; notable extreme assymetry coming to sit, with bil hips aiming enough to the right that she was sitting entirely on the R "flap"of the stedy -- further intensifying the L lateral lean; Able to lean forward and push up enough on RLE to clear flaps for return to bed    Balance Overall balance assessment: Needs assistance Sitting-balance support: Feet supported;Single extremity supported Sitting balance-Leahy Scale: Poor Sitting balance - Comments: MOD - MAX A for static sitting balance EOB with heavy L lateral lean Postural control: Left lateral lean Standing balance support: Bilateral upper extremity supported Standing balance-Leahy Scale: Poor Standing balance comment: MAX A for static standing balance with  heavy L lateral lean as pt presents with strong pusher syndrome so much that pt pushes hips fully to R side of stedy seat only causing L lateral lean to be even more severe                           ADL either performed or assessed with clinical  judgement   ADL Overall ADL's : Needs assistance/impaired     Grooming: Wash/dry face;Total assistance;Bed level               Lower Body Dressing: Total assistance;Bed level Lower Body Dressing Details (indicate cue type and reason): to don socks from bed level Toilet Transfer: Maximal assistance;+2 for physical assistance Toilet Transfer Details (indicate cue type and reason): sit<>stand only with stedy         Functional mobility during ADLs: Total assistance;+2 for physical assistance;Maximal assistance;Cueing for safety;Cueing for sequencing (bed mobility and sit<>stand only in stedy) General ADL Comments: pt continues to present with cognitive impairments, impaired balance L hemi, and decreased activity tolerance     Vision   Vision Assessment?: Vision impaired- to be further tested in functional context Additional Comments: unable to complete full visual assessment, heavy R gave preference with pt briefly tracking to midline with cues but unable to sustain   Perception     Praxis      Cognition Arousal/Alertness: Awake/alert Behavior During Therapy: Impulsive;Restless Overall Cognitive Status: Impaired/Different from baseline Area of Impairment: Orientation;Attention;Memory;Following commands;Safety/judgement;Awareness;Problem solving                 Orientation Level: Disoriented to;Situation;Time (pt able to state location when 2 choices given however pt calling out for "sarah" and "james" during session) Current Attention Level: Sustained Memory: Decreased short-term memory;Decreased recall of precautions Following Commands: Follows one step commands with increased time;Follows one step commands inconsistently;Follows multi-step commands inconsistently Safety/Judgement: Decreased awareness of deficits;Decreased awareness of safety Awareness: Intellectual Problem Solving: Slow processing;Decreased initiation;Difficulty sequencing;Requires verbal  cues;Requires tactile cues General Comments: pt calling out for "james and "sarah" throughout session, following commands inconsistently, pt perseverating on telling PT/ COTA that she is [redacted] wks pregnant and states her mind is focused on "life"        Exercises     Shoulder Instructions       General Comments HR increase to as much as 142 bpm with activity, BP 123/83 from supine post activity    Pertinent Vitals/ Pain       Pain Assessment: Faces Faces Pain Scale: Hurts little more Pain Location: R hand Pain Descriptors / Indicators: Sore Pain Intervention(s): Monitored during session;Limited activity within patient's tolerance  Home Living                                          Prior Functioning/Environment              Frequency  Min 2X/week        Progress Toward Goals  OT Goals(current goals can now be found in the care plan section)  Progress towards OT goals: Progressing toward goals  Acute Rehab OT Goals Patient Stated Goal: to drink Dr. Reino Kent OT Goal Formulation: With patient Potential to Achieve Goals: Fair  Plan Discharge plan remains appropriate;Frequency remains appropriate    Co-evaluation  AM-PAC OT "6 Clicks" Daily Activity     Outcome Measure   Help from another person eating meals?: Total Help from another person taking care of personal grooming?: Total Help from another person toileting, which includes using toliet, bedpan, or urinal?: Total Help from another person bathing (including washing, rinsing, drying)?: Total Help from another person to put on and taking off regular upper body clothing?: Total Help from another person to put on and taking off regular lower body clothing?: Total 6 Click Score: 6    End of Session Equipment Utilized During Treatment: Gait belt;Other (comment) (stedy'' helmet)  OT Visit Diagnosis: Unsteadiness on feet (R26.81);Other abnormalities of gait and mobility  (R26.89);Muscle weakness (generalized) (M62.81);Hemiplegia and hemiparesis Hemiplegia - Right/Left: Left Hemiplegia - dominant/non-dominant: Non-Dominant Hemiplegia - caused by: Cerebral infarction   Activity Tolerance Patient tolerated treatment well   Patient Left in bed;with call bell/phone within reach;with bed alarm set;with restraints reapplied;Other (comment) (mitt on R hand)   Nurse Communication          Time: 6314-9702 OT Time Calculation (min): 40 min  Charges: OT General Charges $OT Visit: 1 Visit OT Treatments $Therapeutic Activity: 8-22 mins  Lenor Derrick., COTA/L Acute Rehabilitation Services [phone]    Barron Schmid 07/31/2020, 1:58 PM

## 2020-07-31 NOTE — Progress Notes (Signed)
PROGRESS NOTE    Brandi Myers  JME:[id number] DOB: 1985/09/30 DOA: 07/04/2020 PCP: [facility]    Brief Narrative:  Brandi Myers was admitted to the hospital with a working diagnosis of right MCA embolic stroke, complicated with right hand arterial thrombosis and systemic inflammatory response syndrome. Prolonged hospitalization.  Patient had acute change in her neurologic condition, around 1215 on day of admission she was found on the bottom of the tub with slurred speech and difficulty moving her left side.  In the emergency department at [facility] she was diagnosed with acute right MCA territory infarct.  She had significant agitation, she was sedated and intubated, placed on mechanical ventilation.  CT angiography showed right CEA occlusion as well as right common carotid artery bifurcation thrombus extending into the external carotid.  Transferred to [facility] for IR intervention.  03/23 underwent cerebral angiogram with endovascular revascularization of occluded right ICA. 03/24 right upper extremity thrombectomy.   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  Patient liberated from mechanical ventilation is 3/31.  Work-up underwent for endocarditis which was negative.   Continue to have severe deconditioning, swallow dysfunction, PEG tube was placed 4/14 and transferred to [facility] 4/17.   Assessment & Plan:   Active Problems:   Stroke (cerebrum) (HCC)   Middle cerebral artery embolism, right   Thrombocytosis   Leukocytosis   Arterial thrombosis (HCC)   Type 2 diabetes mellitus (HCC)   Benign essential hypertension   1. Acute ischemic CVA, right ICA thrombus. Positive hemorrhagic conversion with malignant cerebral edema and brain herniation sp hemicraniectomy. Encephalopathy post ischemic/hemorrhagic event.  Prolonged hospitalization, persistent left sided hemiparesis and  swallow dysfunction. Tolerating well tube feedings. Continue aspiration precautions.  Today started on dysphagia 3 diet.   Anticoagulation with apixaban. Continue atorvastatin 40 mg daily.   Pending placement SNF, patient is medically sable for discharge to SNF.   2. Acute ischemic right upper extremity sp thrombectomy.  right ulnar occlusion per [place] 4/3.   Continue anticoagulation with apixaban.   3. Polysubstance abuse/ cocaine. No withdrawal symptoms, continue with close monitoring.   4. Hyponatremia. Clinically resolved hyponatremia with serum Na today at 138, with K at 4,2 and serum bicarbonate at 30. Will resume free water flushes 200 cc q 6 hrs.    5. T2DM fasting glucose today is 156, will continue with basal insulin 35 units glargine bid and insulin sliding scale for glucose cover and monitoring. Aspart insulin 6 units q 4 hrs.  Patient is tolerating tube feedings well.   6. HTN Continue blood pressure control with metoprolol Sinus tachycardia likely due to mild agitation, will discontinue telemetry monitoring for now.   7. Anemia with iron deficiency sp IV iron supplementation, follow iron panel as outpatient.   8. UTI (not present on admission) plan to completed antibiotic therpay with ceftriaxone for 5 doses.   Status is: Inpatient  Remains inpatient appropriate because:IV treatments appropriate due to intensity of illness or inability to take PO   Dispo: The patient is from: Home              Anticipated d/c is to: SNF              Patient currently is medically stable to d/c.   Difficult to place patient Yes   DVT prophylaxis: apixaban   Code Status:   full  Family Communication:  No family at the bedside  Nutrition Status: Nutrition Problem: Inadequate oral intake Etiology: inability to eat Signs/Symptoms: NPO status Interventions: Tube feeding     Consultants:   Neurology   Procedures:   Peg   Antimicrobials:   Ceftriaxone      Subjective: Patient not in pain or dyspnea, noted with mild agitation, confused and disorientated   Objective: Vitals:   07/31/20 0403 07/31/20 0752 07/31/20 1000 07/31/20 1214  BP: 115/80 115/87 119/88 112/86  Pulse: (!) 110 (!) 113 (!) 125   Resp: 20 19  17   Temp: 98.8 F (37.1 C) 98.3 F (36.8 C)  98 F (36.7 C)  TempSrc: Oral Oral  Oral  SpO2: 98% 94%  98%  Weight:      Height:        Intake/Output Summary (Last 24 hours) at 07/31/2020 1510 Last data filed at 07/31/2020 0744 Gross per 24 hour  Intake 1186.67 ml  Output 350 ml  Net 836.67 ml   Filed Weights   07/21/20 0412 07/23/20 0541 07/27/20 0527  Weight: 70.7 kg 69.7 kg 72.8 kg    Examination:   General: deconditioned and ill looking appearing  Neurology: Awake and alert, left sided hemiparesis.  E ENT: mild pallor, no icterus, oral mucosa moist Cardiovascular: No JVD. S1-S2 present, rhythmic, no gallops, rubs, or murmurs. No lower extremity edema. Pulmonary: positive breath sounds bilaterally, adequate air movement, no wheezing, rhonchi or rales. Gastrointestinal. Abdomen soft and non tender. Peg tube in place.  Skin. No rashes Musculoskeletal: no joint deformities     Data Reviewed: I have personally reviewed following labs and imaging studies  CBC: Recent Labs  Lab 07/27/20 0225 07/28/20 0206 07/29/20 0159 07/30/20 0525 07/31/20 0423  WBC 21.4* 27.0* 21.2* 14.8* 11.0*  HGB 10.1* 9.6* 9.2* 9.9* 9.9*  HCT 36.3 34.6* 32.0* 35.0* 34.8*  MCV 78.7* 80.5 79.6* 81.4 80.4  PLT 742* 519* 495* 458* 425   Basic Metabolic Panel: Recent Labs  Lab 07/27/20 0225 07/28/20 0206 07/29/20 0159 07/30/20 0525 07/31/20 0423  NA 137 134* 133* 135 138  K 4.4 4.2 3.7 4.9 4.2  CL 98 94* 95* 95* 97*  CO2 24 25 28 30 30   GLUCOSE 190* 151* 115* 113* 156*  BUN 8 10 9 11 12   CREATININE 0.57 0.55 0.51 0.59 0.52  CALCIUM 9.9 9.8 9.5 9.6 9.7   GFR: Estimated Creatinine Clearance: 98.1 mL/min (by C-G formula  based on SCr of 0.52 mg/dL). Liver Function Tests: No results for input(s): AST, ALT, ALKPHOS, BILITOT, PROT, ALBUMIN in the last 168 hours. No results for input(s): LIPASE, AMYLASE in the last 168 hours. No results for input(s): AMMONIA in the last 168 hours. Coagulation Profile: No results for input(s): INR, PROTIME in the last 168 hours. Cardiac Enzymes: No results for input(s): CKTOTAL, CKMB, CKMBINDEX, TROPONINI in the last 168 hours. BNP (last 3 results) No results for input(s): PROBNP in the last 8760 hours. HbA1C: No results for input(s): HGBA1C in the last 72 hours. CBG: Recent Labs  Lab 07/30/20 1954 07/30/20 2326 07/31/20 0402 07/31/20 0804 07/31/20 1215  GLUCAP 166* 197* 157* 192* 212*   Lipid Profile: No results for input(s): CHOL, HDL, LDLCALC, TRIG, CHOLHDL, LDLDIRECT in the last 72 hours. Thyroid Function Tests: No results for input(s): TSH, T4TOTAL, FREET4, T3FREE, THYROIDAB in the last 72 hours. Anemia Panel: No results for input(s): VITAMINB12, FOLATE, FERRITIN, TIBC, IRON, RETICCTPCT in the last 72 hours.    Radiology Studies: I have reviewed all of the imaging during this hospital  visit personally     Scheduled Meds: . apixaban  5 mg Per Tube BID  . atorvastatin  40 mg Per Tube q1800  . chlorhexidine gluconate (MEDLINE KIT)  15 mL Mouth Rinse BID  . Chlorhexidine Gluconate Cloth  6 each Topical Daily  . docusate  100 mg Per Tube BID  . free water  200 mL Per Tube Q6H  . gabapentin  600 mg Per Tube Q8H  . insulin aspart  0-15 Units Subcutaneous Q4H  . insulin aspart  6 Units Subcutaneous Q4H  . insulin detemir  35 Units Subcutaneous BID  . mouth rinse  15 mL Mouth Rinse q12n4p  . melatonin  3 mg Per Tube QHS  . metoprolol tartrate  50 mg Per Tube BID  . multivitamin with minerals  1 tablet Per Tube Daily  . polyethylene glycol  17 g Per Tube Daily  . sodium chloride flush  10-40 mL Intracatheter Q12H   Continuous Infusions: . sodium  chloride Stopped (07/13/20 1028)  . sodium chloride Stopped (07/26/20 1046)  . cefTRIAXone (ROCEPHIN)  IV 2 g (07/30/20 1640)  . feeding supplement (GLUCERNA 1.5 CAL) 1,000 mL (07/31/20 0427)  . ferumoxytol Stopped (07/24/20 2152)     LOS: 27 days        Pedro Oldenburg Gerome Apley, MD

## 2020-07-31 NOTE — Progress Notes (Signed)
Physical Therapy Treatment Patient Details Name: Brandi Myers MRN: [id number] DOB: 03-27-1986 Today's Date: 07/31/2020    History of Present Illness 35 yo female admitted with L sided weakness givent TPA for R MCA involving basal ganglia frontal temporal lobes  3/23 s/p BIL revascularization  R MCA ACA achieving 2B revascularization PT intubated starting 3/23 . 3/24 R UE thromectomy CT reveals worsening midline shift start 3% saline 3/25 decompressive hemicranitectomy R side bone flap in abdomen 3/25 fever 103. Extubated 3/31. PMH current smoker uncontrolled DM HTn    PT Comments    Continuing work on functional mobility and activity tolerance; session focused on sitting balance and functional transfers; PT/OT co-session for incr safety with trials of sit <>stand using Stedy; Upon arrival, pt stating her name and DOB and agreeable to co treatment, however pt calling out to people not present in room but able to state she was in the hospital when two choices given. Pt continues to present with impaired balance, visual deficits ( strong R gaze preference) decreased activity tolerance, and impaired cognition. Pt currently  Brandi Myers continues with strong L lean and R gaze preference; Requires Max/Total assist for sitting balance; Pt was able to stand to stedy with RUE pulling heavily on bar of stedy to power into standing, MAX A needed for standing balance as pt pushing with RUE to L side. Poor proprioception as pt with no awareness to balance deficits.Notably good push up to stand in [name], albeit heavily one-sided  Follow Up Recommendations  SNF (Noted CIR was denied)     Equipment Recommendations  Wheelchair (measurements PT);Wheelchair cushion (measurements PT);Hospital bed    Recommendations for Other Services       Precautions / Restrictions Precautions Precautions: Fall;Other (comment) Precaution Comments: bone flap out in R abdomen, L hemiparesis, PEG, helmet with  activity Restrictions Weight Bearing Restrictions: No    Mobility  Bed Mobility Overal bed mobility: Needs Assistance Bed Mobility: Supine to Sit     Supine to sit: Total assist;+2 for physical assistance;+2 for safety/equipment Sit to supine: Total assist;+2 for physical assistance;+2 for safety/equipment   General bed mobility comments: total A +2 for all aspects of bed mobility    Transfers Overall transfer level: Needs assistance Equipment used: Ambulation equipment used Transfers: Sit to/from Stand Sit to Stand: Total assist;+2 physical assistance;+2 safety/equipment         General transfer comment: Stedy set up in front of pt, and bil UEs on stedy bar with assist for LUE; as PT and COTA were planning inital standing attempt, noted pt made solid effort to power up to stand impulsively; RLE with good push, and able to clear hips from bed and come to near fully upright stand with extremely heavy L lean; multimodal cueing to sit down to high stedy seat; notable extreme assymetry coming to sit, with bil hips aiming enough to the right that she was sitting entirely on the R "flap"of the stedy -- further intensifying the L lateral lean; Able to lean forward and push up enough on RLE to clear flaps for return to bed  Ambulation/Gait                 Stairs             Wheelchair Mobility    Modified Rankin (Stroke Patients Only) Modified Rankin (Stroke Patients Only) Pre-Morbid Rankin Score: No symptoms Modified Rankin: Severe disability     Balance Overall balance assessment: Needs assistance Sitting-balance support: Feet supported;Single extremity  supported Sitting balance-Leahy Scale: Poor Sitting balance - Comments: MOD - MAX A for static sitting balance EOB with heavy L lateral lean Postural control: Left lateral lean Standing balance support: Bilateral upper extremity supported Standing balance-Leahy Scale: Poor Standing balance comment: MAX A for  static standing balance with heavy L lateral lean as pt presents with strong pusher syndrome so much that pt pushes hips fully to R side of stedy seat only causing L lateral lean to be even more severe                            Cognition Arousal/Alertness: Awake/alert Behavior During Therapy: Impulsive;Restless Overall Cognitive Status: Impaired/Different from baseline Area of Impairment: Orientation;Attention;Memory;Following commands;Safety/judgement;Awareness;Problem solving                 Orientation Level: Disoriented to;Situation;Time (pt able to state location when 2 choices given however pt calling out for "sarah" and "james" during session) Current Attention Level: Sustained Memory: Decreased short-term memory;Decreased recall of precautions Following Commands: Follows one step commands with increased time;Follows one step commands inconsistently;Follows multi-step commands inconsistently Safety/Judgement: Decreased awareness of deficits;Decreased awareness of safety Awareness: Intellectual Problem Solving: Slow processing;Decreased initiation;Difficulty sequencing;Requires verbal cues;Requires tactile cues General Comments: pt calling out for "james and "sarah" throughout session, following commands inconsistently, pt perseverating on telling PT/ COTA that she is [redacted] wks pregnant and states her mind is focused on "life"      Exercises      General Comments General comments (skin integrity, edema, etc.): HR increase to as much as 142 bpm with activity, BP 123/83 from supine post activity      Pertinent Vitals/Pain Pain Assessment: Faces Faces Pain Scale: Hurts little more Pain Location: R hand Pain Descriptors / Indicators: Sore Pain Intervention(s): Monitored during session    Home Living                      Prior Function            PT Goals (current goals can now be found in the care plan section) Acute Rehab PT Goals Patient Stated  Goal: to drink Dr. Reino Kent PT Goal Formulation: With patient Time For Goal Achievement: 08/06/20 Potential to Achieve Goals: Fair Progress towards PT goals: Progressing toward goals (Goal update done; original goals remain appropriate)    Frequency    Min 3X/week      PT Plan Discharge plan needs to be updated    Co-evaluation PT/OT/SLP Co-Evaluation/Treatment: Yes Reason for Co-Treatment: Complexity of the patient's impairments (multi-system involvement);Necessary to address cognition/behavior during functional activity;For patient/therapist safety;To address functional/ADL transfers PT goals addressed during session: Mobility/safety with mobility        AM-PAC PT "6 Clicks" Mobility   Outcome Measure  Help needed turning from your back to your side while in a flat bed without using bedrails?: A Lot Help needed moving from lying on your back to sitting on the side of a flat bed without using bedrails?: A Lot Help needed moving to and from a bed to a chair (including a wheelchair)?: Total Help needed standing up from a chair using your arms (e.g., wheelchair or bedside chair)?: Total Help needed to walk in hospital room?: Total Help needed climbing 3-5 steps with a railing? : Total 6 Click Score: 8    End of Session Equipment Utilized During Treatment: Gait belt (helmet) Activity Tolerance: Patient tolerated treatment well Patient left: in bed;with  call bell/phone within reach;with bed alarm set Nurse Communication: Mobility status PT Visit Diagnosis: Other abnormalities of gait and mobility (R26.89);Hemiplegia and hemiparesis;Other symptoms and signs involving the nervous system (R29.898);Muscle weakness (generalized) (M62.81);Difficulty in walking, not elsewhere classified (R26.2) Hemiplegia - Right/Left: Left Hemiplegia - dominant/non-dominant: Non-dominant Hemiplegia - caused by: Cerebral infarction     Time: 1210-1250 PT Time Calculation (min) (ACUTE ONLY): 40  min  Charges:  $Therapeutic Activity: 23-37 mins                     Van Clines, PT  Acute Rehabilitation Services Pager [phone] Office [phone]    Levi Aland 07/31/2020, 2:47 PM

## 2020-08-01 DIAGNOSIS — R1314 Dysphagia, pharyngoesophageal phase: Secondary | ICD-10-CM

## 2020-08-01 DIAGNOSIS — I742 Embolism and thrombosis of arteries of the upper extremities: Secondary | ICD-10-CM | POA: Diagnosis not present

## 2020-08-01 DIAGNOSIS — E11311 Type 2 diabetes mellitus with unspecified diabetic retinopathy with macular edema: Secondary | ICD-10-CM

## 2020-08-01 DIAGNOSIS — E1165 Type 2 diabetes mellitus with hyperglycemia: Secondary | ICD-10-CM | POA: Diagnosis not present

## 2020-08-01 LAB — GLUCOSE, CAPILLARY
Glucose-Capillary: 116 mg/dL — ABNORMAL HIGH (ref 70–99)
Glucose-Capillary: 139 mg/dL — ABNORMAL HIGH (ref 70–99)
Glucose-Capillary: 164 mg/dL — ABNORMAL HIGH (ref 70–99)
Glucose-Capillary: 188 mg/dL — ABNORMAL HIGH (ref 70–99)
Glucose-Capillary: 157 mg/dL — ABNORMAL HIGH (ref 70–99)
Glucose-Capillary: 162 mg/dL — ABNORMAL HIGH (ref 70–99)
Glucose-Capillary: 133 mg/dL — ABNORMAL HIGH (ref 70–99)

## 2020-08-01 LAB — CULTURE, BLOOD (ROUTINE X 2)
Culture: NO GROWTH
Culture: NO GROWTH

## 2020-08-01 NOTE — Progress Notes (Addendum)
CSW spoke with Antionette at [facility] - she states authorization is still pending at this time.  Edwin Dada, MSW, LCSW Transitions of Care  Clinical Social Worker II [phone]

## 2020-08-01 NOTE — Progress Notes (Addendum)
[facility] PROGRESS NOTE  Brandi Myers AXE:[id number] DOB: 1985-07-23 DOA: 07/04/2020 PCP: [facility] [place]       Status: Remains inpatient appropriate because:Altered mental status, Unsafe d/c plan and Inpatient level of care appropriate due to severity of illness   Dispo: The patient is from: Home              Anticipated d/c is to: [employer] authorization from [facility]              Patient currently is medically stable to d/c.   Difficult to place patient No   Level of care: Med-Surg  Code Status: Full Family Communication: Patient DVT prophylaxis: Eliquis Vaccination status: Unknown  Foley catheter: No  HPI: 35 year old female admitted to the hospital with a working diagnosis of right MCA embolic stroke, complicated with right hand arterial thrombosis and systemic inflammatory response syndrome. She was found on the bottom of the tub at her homewith slurred speech and difficulty moving her left side. In the emergency department at [facility] was diagnosed with acute right MCA territory infarct. She had significant agitation, she was sedated and intubated, placed on mechanical ventilation. CT angiography showed right CEA occlusion as well as right common carotid artery bifurcation thrombus extending into the external carotid. Transferred to [facility] for IR intervention.  03/23underwent cerebral angiogram with endovascular revascularization of occluded right ICA. 03/24 right upper extremity thrombectomy.  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  Patient liberated from mechanical ventilation is 3/31.  Work-up underwent for endocarditis which was negative.   Continue to have severe deconditioning, swallow dysfunction, PEG tube was placed 4/14 and transferred to [facility] 4[facility]17.  Subjective: Awake and alert.  Asking for hand lotion.  Right leg  draped over side rail.  Made mention that she was eager to "leave this place, I think they are keeping me prisoner".  Objective: Vitals:   08/01/20 0343 08/01/20 0746  BP: 119/89 114/88  Pulse: (!) 108 99  Resp: 17 16  Temp: 99.1 F (37.3 C) 99.7 F (37.6 C)  SpO2:  96%    Intake/Output Summary (Last 24 hours) at 08/01/2020 1033 Last data filed at 08/01/2020 0339 Gross per 24 hour  Intake --  Output 400 ml  Net -400 ml   Filed Weights   07/21/20 0412 07/23/20 0541 07/27/20 0527  Weight: 70.7 kg 69.7 kg 72.8 kg    Exam:  Constitutional: NAD, slightly restless, comfortable Respiratory: clear to auscultation bilaterally, no wheezing, no crackles. Normal respiratory effort. No accessory muscle use.  Cardiovascular: Regular rate and rhythm, no murmurs / rubs / gallops. No extremity edema.  Manish pedal pulses. Abdomen: no tenderness, no masses palpated. No hepatosplenomegaly. Bowel sounds positive.  Musculoskeletal: no clubbing / cyanosis. No joint deformity upper and lower extremities. Good ROM, no contractures. Normal muscle tone.  Skin: Has necrotic area right thumb with an adjacent arterial ulcer (see pictures) Neurologic: CN 2-12 grossly intact except for facial droop on the left, slurred speech, and left hemiparesis Psychiatric: Alert.  Oriented to name and to place.  Not to year.  Does not appear to have insight into current medical status and is expressing some paranoid thoughts stating that they are keeping her hair as a prisoner.   Assessment/Plan: Acute problems: Acute ischemic CVA, right ICA thrombus. Positive hemorrhagic conversion with malignant cerebral edema and brain herniation sp hemicraniectomy. Encephalopathy post ischemic/hemorrhagic event.  Prolonged hospitalization, persistent  left sided hemiparesis with associated dysphagia and physical debility Continue apixaban. Continue atorvastatin 40 mg daily.  Pending placement SNF, patient is medically sable for  discharge to SNF.   Acute ischemic right upper extremity sp thrombectomy/right ulnar occlusion per [place] 4/3.  Continue anticoagulation with apixaban.  Continue Neurontin 600 mg every 8 hours-if ineffective can transition to Lyrica     Dysphagia Nutrition Problem: Inadequate oral intake Etiology: inability to eat Signs/Symptoms: NPO status-SLP approved E3 mechanical soft diet with honey thick liquids as of 4/19 after patient underwent modified barium swallow study Interventions: Tube feeding Estimated body mass index is 26.71 kg/m as calculated from the following:   Height as of this encounter: 5' 5" (1.651 m).   Weight as of this encounter: 72.8 kg.   Physical debility post stroke PT and OT recommending SNF placement  Polysubstance abuse/ cocaine.  No withdrawal symptoms, continue with close monitoring. Far enough out now so okay to utilize beta-blocker  Diabetes mellitus 2 now on insulin Hemoglobin A1c 10.6 as of 07/04/2020 No preadmission medications documented Continue Levemir 35 units twice daily Continue sliding scale insulin as well as NovoLog 6 units every 4 hours while on tube feedings  HTN Continue metoprolol.     Other problems: Anemia with iron deficiency  sp IV iron supplementation Repeat anemia panel 6 weeks after discharge  UTI (not present on admission) Rocephin x5 days with last dose due on 4/21  Data Reviewed: Basic Metabolic Panel: Recent Labs  Lab 07/27/20 0225 07/28/20 0206 07/29/20 0159 07/30/20 0525 07/31/20 0423  NA 137 134* 133* 135 138  K 4.4 4.2 3.7 4.9 4.2  CL 98 94* 95* 95* 97*  CO2 _0 GLUCOSE 190* 151* 115* 113* 156*  BUN _1 CREATININE 0.57 0.55 0.51 0.59 0.52  CALCIUM 9.9 9.8 9.5 9.6 9.7   Liver Function Tests: No results for input(s): AST, ALT, ALKPHOS, BILITOT, PROT, ALBUMIN in the last 168 hours. No results for input(s): LIPASE, AMYLASE in the last 168 hours. No results for input(s): AMMONIA in  the last 168 hours. CBC: Recent Labs  Lab 07/27/20 0225 07/28/20 0206 07/29/20 0159 07/30/20 0525 07/31/20 0423  WBC 21.4* 27.0* 21.2* 14.8* 11.0*  HGB 10.1* 9.6* 9.2* 9.9* 9.9*  HCT 36.3 34.6* 32.0* 35.0* 34.8*  MCV 78.7* 80.5 79.6* 81.4 80.4  PLT 742* 519* 495* 458* 390   Cardiac Enzymes: No results for input(s): CKTOTAL, CKMB, CKMBINDEX, TROPONINI in the last 168 hours. BNP (last 3 results) No results for input(s): BNP in the last 8760 hours.  ProBNP (last 3 results) No results for input(s): PROBNP in the last 8760 hours.  CBG: Recent Labs  Lab 07/31/20 1619 07/31/20 1931 07/31/20 2326 08/01/20 0347 08/01/20 0747  GLUCAP 228* 139* 135* 116* 139*    Recent Results (from the past 240 hour(s))  Culture, blood (Routine X 2) w Reflex to ID Panel     Status: None   Collection Time: 07/27/20  3:32 PM   Specimen: BLOOD  Result Value Ref Range Status   Specimen Description BLOOD SITE NOT SPECIFIED  Final   Special Requests   Final    BOTTLES DRAWN AEROBIC ONLY Blood Culture results may not be optimal due to an inadequate volume of blood received in culture bottles   Culture   Final    NO GROWTH 5 DAYS Performed at [facility], [place] [address], [place], [place] [address]    Report  Status 08/01/2020 FINAL  Final  Culture, blood (Routine X 2) w Reflex to ID Panel     Status: None   Collection Time: 07/27/20  3:36 PM   Specimen: BLOOD  Result Value Ref Range Status   Specimen Description BLOOD SITE NOT SPECIFIED  Final   Special Requests   Final    BOTTLES DRAWN AEROBIC ONLY Blood Culture results may not be optimal due to an inadequate volume of blood received in culture bottles   Culture   Final    NO GROWTH 5 DAYS Performed at [facility], [place] [address] [place], [place] [address]    Report Status 08/01/2020 FINAL  Final  Culture, Urine     Status: None   Collection Time: 07/28/20  1:00 PM   Specimen: Urine, Random  Result Value Ref Range Status    Specimen Description URINE, RANDOM  Final   Special Requests NONE  Final   Culture   Final    NO GROWTH Performed at [facility], [place] [address], [place], [place] [address]    Report Status 07/29/2020 FINAL  Final     Studies: DG Swallowing Func-Speech Pathology  Result Date: 07/31/2020 Objective Swallowing Evaluation: Type of Study: MBS-Modified Barium Swallow Study  Patient Details Name: SWEDEN LESURE MRN: [id number] Date of Birth: [birth year] Today's Date: 07/31/2020 Time: SLP Start Time (ACUTE ONLY): 0930 -SLP Stop Time (ACUTE ONLY): 1000 SLP Time Calculation (min) (ACUTE ONLY): 30 min Past Medical History: Past Medical History: Diagnosis Date . Diabetes mellitus without complication ([place])  . Hypertension  Past Surgical History: Past Surgical History: Procedure Laterality Date . BUBBLE STUDY  07/19/2020  Procedure: BUBBLE STUDY;  Surgeon: Donato Heinz, MD;  Location: [place];  Service: Cardiovascular;; . CESAREAN SECTION   . CHOLECYSTECTOMY   . CRANIOTOMY Right 07/06/2020  Procedure: RIGHT CRANIECTOMY WITH PLACEMENT OF BONE FLAP IN ABDOMEN;  Surgeon: Erline Levine, MD;  Location: [place];  Service: Neurosurgery;  Laterality: Right; . ESOPHAGOGASTRODUODENOSCOPY (EGD) WITH PROPOFOL N/A 07/26/2020  Procedure: ESOPHAGOGASTRODUODENOSCOPY (EGD) WITH PROPOFOL;  Surgeon: Georganna Skeans, MD;  Location: [place];  Service: General;  Laterality: N/A; . IR ANGIO INTRA EXTRACRAN SEL COM CAROTID INNOMINATE UNI L MOD SED  07/04/2020 . IR ANGIO VERTEBRAL SEL VERTEBRAL UNI R MOD SED  07/04/2020 . IR CT HEAD LTD  07/04/2020 . IR CT HEAD LTD  07/04/2020 . IR PERCUTANEOUS ART THROMBECTOMY/INFUSION INTRACRANIAL INC DIAG ANGIO  07/04/2020 . PEG PLACEMENT N/A 07/26/2020  Procedure: PERCUTANEOUS ENDOSCOPIC GASTROSTOMY (PEG) PLACEMENT;  Surgeon: Georganna Skeans, MD;  Location: [place];  Service: General;  Laterality: N/A; . RADIOLOGY WITH ANESTHESIA N/A 07/04/2020  Procedure: IR WITH ANESTHESIA;  Surgeon:  Radiologist, Medication, MD;  Location: [place];  Service: Radiology;  Laterality: N/A; . TEE WITHOUT CARDIOVERSION N/A 07/19/2020  Procedure: TRANSESOPHAGEAL ECHOCARDIOGRAM (TEE);  Surgeon: Donato Heinz, MD;  Location: [facility] ENDOSCOPY;  Service: Cardiovascular;  Laterality: N/A; . THROMBECTOMY BRACHIAL ARTERY Right 07/05/2020  Procedure: RIGHT UPPER EXTREMITY THROMBECTOMY;  Surgeon: Marty Heck, MD;  Location: [facility] OR;  Service: Vascular;  Laterality: Right; HPI: 34 yo female admitted with L sided weakness, given TPA for R MCA involving basal ganglia, frontal, temporal lobes  3/23 s/p BIL revascularization  R MCA ACA achieving 2B revascularization. Pt intubated 3/23-31. 3/24 R UE thrombectomy. CT revealed worsening midline shift. 3/25 decompressive hemicranitectomy, R side bone flap in abdomen. PMH current smoker uncontrolled DM HTn  Subjective: -- (adequately awake) Assessment / Plan / Recommendation  CHL IP CLINICAL IMPRESSIONS 07/31/2020 Clinical Impression Pt demonstrates some improvement in swallowing, primarily in airway protection, though severe cognitive impairment and oral dyspahgia persist. Pt is restless and distractible int he MBS suite (improved attention to PO in room yesterday) and needs max verbal and visual cues to attend to PO. All trials were given with a straw, which seems to be her preferred method of sipping. Given fleeting attention she does not always immediately form, propel and swallow bolus; she looses attention intermittently and there is slow delayed movement. She aspirated sips of nectar with sensation before the swallow, but tolerated honey, puree and regular solids without difficulty and adequate strength throughout. Recommend pt initaite a dys 3 mech soft diet with honey thick liquids. Will f/u for tolerance. SLP Visit Diagnosis Dysphagia, oropharyngeal phase (R13.12) Attention and concentration deficit following -- Frontal lobe and executive function deficit following --  Impact on safety and function Moderate aspiration risk   CHL IP TREATMENT RECOMMENDATION 07/31/2020 Treatment Recommendations Therapy as outlined in treatment plan below   Prognosis 07/20/2020 Prognosis for Safe Diet Advancement Good Barriers to Reach Goals Severity of deficits Barriers/Prognosis Comment -- CHL IP DIET RECOMMENDATION 07/31/2020 SLP Diet Recommendations NPO;Dysphagia 3 (Mech soft) solids;Honey thick liquids Liquid Administration via -- Medication Administration -- Compensations Slow rate;Small sips/bites;Minimize environmental distractions Postural Changes Seated upright at 90 degrees   CHL IP OTHER RECOMMENDATIONS 07/31/2020 Recommended Consults -- Oral Care Recommendations Oral care BID Other Recommendations Order thickener from pharmacy;Have oral suction available   CHL IP FOLLOW UP RECOMMENDATIONS 07/31/2020 Follow up Recommendations Skilled Nursing facility   [facility] IP FREQUENCY AND DURATION 07/31/2020 Speech Therapy Frequency (ACUTE ONLY) min 2x/week Treatment Duration 2 weeks      CHL IP ORAL PHASE 07/31/2020 Oral Phase Impaired Oral - Pudding Teaspoon -- Oral - Pudding Cup -- Oral - Honey Teaspoon -- Oral - Honey Cup Decreased bolus cohesion;Delayed oral transit;Premature spillage;Lingual/palatal residue Oral - Nectar Teaspoon -- Oral - Nectar Cup Decreased bolus cohesion;Delayed oral transit;Premature spillage;Lingual/palatal residue Oral - Nectar Straw Decreased bolus cohesion;Delayed oral transit;Premature spillage;Lingual/palatal residue Oral - Thin Teaspoon -- Oral - Thin Cup -- Oral - Thin Straw -- Oral - Puree Decreased bolus cohesion;Delayed oral transit;Premature spillage;Lingual/palatal residue Oral - Mech Soft -- Oral - Regular Decreased bolus cohesion;Delayed oral transit;Premature spillage;Lingual/palatal residue Oral - Multi-Consistency -- Oral - Pill -- Oral Phase - Comment --  CHL IP PHARYNGEAL PHASE 07/31/2020 Pharyngeal Phase Impaired Pharyngeal- Pudding Teaspoon -- Pharyngeal --  Pharyngeal- Pudding Cup -- Pharyngeal -- Pharyngeal- Honey Teaspoon -- Pharyngeal -- Pharyngeal- Honey Cup Delayed swallow initiation-pyriform sinuses;Penetration/Aspiration before swallow Pharyngeal Material enters airway, remains ABOVE vocal cords then ejected out;Material does not enter airway Pharyngeal- Nectar Teaspoon -- Pharyngeal -- Pharyngeal- Nectar Cup Penetration/Aspiration before swallow;Delayed swallow initiation-pyriform sinuses;Trace aspiration Pharyngeal Material enters airway, passes BELOW cords and not ejected out despite cough attempt by patient;Material enters airway, passes BELOW cords then ejected out;Material does not enter airway Pharyngeal- Nectar Straw -- Pharyngeal -- Pharyngeal- Thin Teaspoon -- Pharyngeal -- Pharyngeal- Thin Cup -- Pharyngeal -- Pharyngeal- Thin Straw -- Pharyngeal -- Pharyngeal- Puree Delayed swallow initiation-pyriform sinuses Pharyngeal -- Pharyngeal- Mechanical Soft -- Pharyngeal -- Pharyngeal- Regular -- Pharyngeal -- Pharyngeal- Multi-consistency -- Pharyngeal -- Pharyngeal- Pill -- Pharyngeal -- Pharyngeal Comment --  CHL IP CERVICAL ESOPHAGEAL PHASE 07/20/2020 Cervical Esophageal Phase WFL Pudding Teaspoon -- Pudding Cup -- Honey Teaspoon -- Honey Cup -- Nectar Teaspoon -- Nectar Cup -- Nectar Straw -- Thin Teaspoon -- Thin Cup -- Thin  Straw -- Puree -- Mechanical Soft -- Regular -- Multi-consistency -- Pill -- Cervical Esophageal Comment -- Herbie Baltimore, MA CCC-SLP Acute Rehabilitation Services Pager [phone] Office [phone] Lynann [place] 07/31/2020, 10:47 AM               Scheduled Meds: . apixaban  5 mg Per Tube BID  . atorvastatin  40 mg Per Tube q1800  . chlorhexidine gluconate (MEDLINE KIT)  15 mL Mouth Rinse BID  . Chlorhexidine Gluconate Cloth  6 each Topical Daily  . docusate  100 mg Per Tube BID  . free water  200 mL Per Tube Q6H  . gabapentin  600 mg Per Tube Q8H  . insulin aspart  0-15 Units Subcutaneous Q4H  . insulin  aspart  6 Units Subcutaneous Q4H  . insulin detemir  35 Units Subcutaneous BID  . mouth rinse  15 mL Mouth Rinse q12n4p  . melatonin  3 mg Per Tube QHS  . metoprolol tartrate  50 mg Per Tube BID  . multivitamin with minerals  1 tablet Per Tube Daily  . polyethylene glycol  17 g Per Tube Daily  . sodium chloride flush  10-40 mL Intracatheter Q12H   Continuous Infusions: . sodium chloride Stopped (07/13/20 1028)  . sodium chloride Stopped (07/26/20 1046)  . cefTRIAXone (ROCEPHIN)  IV 2 g (07/31/20 1514)  . feeding supplement (GLUCERNA 1.5 CAL) 1,000 mL (08/01/20 0429)    Active Problems:   Stroke (cerebrum) ([place])   Middle cerebral artery embolism, right   Thrombocytosis   Leukocytosis   Arterial thrombosis (H[facility])   Type 2 diabetes mellitus ([place])   Benign essential hypertension   Consultants:  Neurology  General surgery  Procedures:  Percutaneous endoscopic gastrostomy tube  Echocardiogram  Core track tube  Right upper extremity thrombectomy  Right craniectomy with placement of bone flap  Antibiotics: Anti-infectives (From admission, onward)   Start     Dose/Rate Route Frequency Ordered Stop   07/28/20 1600  cefTRIAXone (ROCEPHIN) 2 g in sodium chloride 0.9 % 100 mL IVPB        2 g 200 mL/hr over 30 Minutes Intravenous Every 24 hours 07/28/20 1506 08/02/20 1559   07/08/20 0900  cefTRIAXone (ROCEPHIN) 2 g in sodium chloride 0.9 % 100 mL IVPB        2 g 200 mL/hr over 30 Minutes Intravenous Every 24 hours 07/08/20 0801 07/14/20 1132   07/08/20 0900  vancomycin (VANCOCIN) 1,750 mg in sodium chloride 0.9 % 500 mL IVPB        1,750 mg 250 mL/hr over 120 Minutes Intravenous  Once 07/08/20 0802 07/08/20 1149   07/06/20 2000  ceFAZolin (ANCEF) IVPB 1 g/50 mL premix        1 g 100 mL/hr over 30 Minutes Intravenous Every 8 hours 07/06/20 1400 07/07/20 0508   07/06/20 0945  ceFAZolin (ANCEF) IVPB 2g/100 mL premix        2 g 200 mL/hr over 30 Minutes Intravenous To  Surgery 07/06/20 0855 07/07/20 0945   07/04/20 1541  ceFAZolin (ANCEF) 2-4 GM/100ML-% IVPB       Note to Pharmacy: Desiree Hane   : cabinet override      07/04/20 1541 07/05/20 0344        Time spent: 35 minutes    Erin Hearing ANP  [facility] 7 am - 330 pm/M-F for direct patient care and secure chat Please refer to Amion for contact info 28  days

## 2020-08-02 DIAGNOSIS — R1314 Dysphagia, pharyngoesophageal phase: Secondary | ICD-10-CM | POA: Diagnosis not present

## 2020-08-02 DIAGNOSIS — I742 Embolism and thrombosis of arteries of the upper extremities: Secondary | ICD-10-CM | POA: Diagnosis not present

## 2020-08-02 DIAGNOSIS — R509 Fever, unspecified: Secondary | ICD-10-CM

## 2020-08-02 LAB — GLUCOSE, CAPILLARY
Glucose-Capillary: 94 mg/dL (ref 70–99)
Glucose-Capillary: 174 mg/dL — ABNORMAL HIGH (ref 70–99)
Glucose-Capillary: 158 mg/dL — ABNORMAL HIGH (ref 70–99)
Glucose-Capillary: 119 mg/dL — ABNORMAL HIGH (ref 70–99)
Glucose-Capillary: 154 mg/dL — ABNORMAL HIGH (ref 70–99)
Glucose-Capillary: 157 mg/dL — ABNORMAL HIGH (ref 70–99)

## 2020-08-02 LAB — BCR-ABL1, CML/ALL, PCR, QUANT: Interpretation (BCRAL):: NEGATIVE

## 2020-08-02 MED ORDER — SERTRALINE HCL 25 MG PO TABS
25.0000 mg | ORAL_TABLET | Freq: Every day | ORAL | Status: DC
  Administered 2020-08-02 – 2020-08-03 (×2): 25 mg
  Filled 2020-08-02 (×2): qty 1

## 2020-08-02 MED ORDER — VALPROIC ACID 250 MG/5ML PO SOLN
250.0000 mg | Freq: Two times a day (BID) | ORAL | Status: DC
  Administered 2020-08-02 – 2020-08-03 (×3): 250 mg
  Filled 2020-08-02 (×4): qty 5

## 2020-08-02 NOTE — Progress Notes (Addendum)
Nutrition Follow-up  DOCUMENTATION CODES:   Not applicable  INTERVENTION:  Continue via PEG: Glucerna 1.5 at 50 ml/h (1200 ml per day) 264m free water Q6H   Provides 1800 kcal, 99 gm protein, 911 ml free water daily (17137mtotal free water with flushes)  NUTRITION DIAGNOSIS:   Inadequate oral intake related to inability to eat as evidenced by NPO status. - ongoing  GOAL:   Patient will meet greater than or equal to 90% of their needs - Met with TF  MONITOR:   TF tolerance,Diet advancement  REASON FOR ASSESSMENT:   Consult Enteral/tube feeding initiation and management,Assessment of nutrition requirement/status  ASSESSMENT:   Pt with PMH of HTN and DM who admitted with L sided weakness and slurred speech per CT pt with R CVA.  3/23 s/p tPA and IR; intubated 3/24 hemorrhagic transformation, RUE thrombectomy  3/25 s/p R crani with placement of bone flap in R abd 3/28 cortrak placed; tip gastric  3/29 off arctic sun; desat with PT 3/31 extubated  4/06 cortrak advanced after having been dislodged 4/07 cortrak removed due to TEE 4/08 cortrak replaced s/p failed MBS (tip gastric) 4/12 diet advanced to dysphagia 1 with pudding thick liquids 4/13 cortrak removed 4/14 PEG placed [facility]19 diet advanced to dysphagia 3 with honey thick liquids   Pt pending SNF placement/insurance authorization.   Pt noted to be agitated with some impulsivity and continues to have difficulty following commands at times.   Although pt able to tolerate PO diet, recommend continuing to meet 100% of pt's nutritional needs via TF until mentation is consistently improved as meal completion documented as 10-40% x 4 recorded meals (28% average meal intake). Additionally, there are limited supplement options for honey thick liquids and the only appropriate supplement on the hospital formulary comes with the pt's diet order by default.   Pt tolerating TF via PEG per RN. Current  regimen is Glucerna 1.5 @ 5056mr with 200m81mee water Q6H   Admit wt: 72.2 kg Current wt: 72.4 kg  Medications: colace, SSI Q4H, novolog 6 units Q4H, 35 units levemir BID, mvi with minerals, miralax Labs reviewed. CBGs [phone] Order:   Diet Order            DIET DYS 3 Room service appropriate? No; Fluid consistency: Honey Thick  Diet effective now                 EDUCATION NEEDS:   No education needs have been identified at this time  Skin:  Skin Assessment: Reviewed RN Assessment (incisions; venous stasis ulcer R hand)  Last BM:  4/19  Height:   Ht Readings from Last 1 Encounters:  07/06/20 _0  (1.651 m)    Weight:   Wt Readings from Last 1 Encounters:  08/02/20 72.4 kg    Ideal Body Weight:  56.8 kg  BMI:  Body mass index is 26.56 kg/m.  Estimated Nutritional Needs:   Kcal:  1800-2000  Protein:  90-110 grams  Fluid:  >1.8 L/day    Brandi Myers, RD, LDN RD pager number and weekend/on-call pager number located in [email or web address]

## 2020-08-02 NOTE — Progress Notes (Addendum)
[facility] PROGRESS NOTE  JOYLEEN HASELTON ZOX:[id number] DOB: 1985-09-25 DOA: 07/04/2020 [facility]CP: [facility]       Status: Remains inpatient appropriate because:Altered mental status, Unsafe d/c plan and Inpatient level of care appropriate due to severity of illness   Dispo: The patient is from: Home              Anticipated d/c is to: [employer] authorization from [facility]              Patient currently is medically stable to d/c.   Difficult to place patient No   Level of care: Med-Surg  Code Status: Full Family Communication: Patient; 4/21 spoke with patient's mother and father at bedside. DVT prophylaxis: Eliquis Vaccination status: Unknown  Foley catheter: No  HPI: 35 year old female admitted to the hospital with a working diagnosis of right MCA embolic stroke, complicated with right hand arterial thrombosis and systemic inflammatory response syndrome. She was found on the bottom of the tub at her homewith slurred speech and difficulty moving her left side. In the emergency department at [facility] was diagnosed with acute right MCA territory infarct. She had significant agitation, she was sedated and intubated, placed on mechanical ventilation. CT angiography showed right CEA occlusion as well as right common carotid artery bifurcation thrombus extending into the external carotid. Transferred to [facility] for IR intervention.  03/23underwent cerebral angiogram with endovascular revascularization of occluded right ICA. 03/24 right upper extremity thrombectomy.  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  Patient liberated from mechanical ventilation is 3/31.  Work-up underwent for endocarditis which was negative.   Continue to have severe deconditioning, swallow dysfunction, PEG tube was placed 4/14 and transferred to [facility]  4/17.  Subjective: Awake.  Again lying malpositioned in bed with right leg draped over side rail.  Asking me to dial telephone number for her aunt.  No answer.  Requesting orange juice.  Then asked me as I was leaving the room to help her stand up.  Objective: Vitals:   08/02/20 0743 08/02/20 0800  BP: 118/87   Pulse: (!) 117 (!) 105  Resp: 17   Temp: 98.3 F (36.8 C)   SpO2: 99%    No intake or output data in the 24 hours ending 08/02/20 0819 Filed Weights   07/23/20 0541 07/27/20 0527 08/02/20 0500  Weight: 69.7 kg 72.8 kg 72.4 kg    Exam:  Constitutional: Alert, pleasant but restless.  No acute distress Respiratory: Lung sounds are clear.  Stable on room air Cardiovascular: Normal heart sounds, normotensive.  No peripheral edema. Abdomen: Soft nontender.  PEG tube unremarkable with tube feeding infusing Skin: Has necrotic area right thumb with an adjacent arterial ulcer (see pictures) Neurologic: CN 2-12 grossly intact except for facial droop on the left, slurred speech, and left hemiparesis Psychiatric: Alert and oriented times name only.  Restless but overall pleasant affect and is not combative   Assessment/Plan: Acute problems: Acute ischemic CVA, right ICA thrombus. Positive hemorrhagic conversion with malignant cerebral edema and brain herniation sp hemicraniectomy. Encephalopathy post ischemic/hemorrhagic event.  Prolonged hospitalization, persistent left sided hemiparesis with associated dysphagia and physical debility Continue apixaban. Continue atorvastatin 40 mg daily.  Pending placement SNF, patient is medically stable for discharge to SNF.  4/21 continues to have some agitated but noncombative behaviors as well as some impulsivity and difficulty following commands at times.  We will begin Zoloft and twice  daily Depakene for behavior management.  Check LFTs in 2 weeks  Acute ischemic right upper extremity sp thrombectomy/right ulnar occlusion per [place] 4/3.   Continue anticoagulation with apixaban.  Continue Neurontin 600 mg every 8 hours-if ineffective can transition to Lyrica     Dysphagia Nutrition Problem: Inadequate oral intake Etiology: inability to eat Signs/Symptoms: NPO status-SLP approved E3 mechanical soft diet with honey thick liquids as of 4/19 after patient underwent modified barium swallow study Interventions: Tube feeding Estimated body mass index is 26.56 kg/m as calculated from the following:   Height as of this encounter: 5' 5"  (1.651 m).   Weight as of this encounter: 72.4 kg.   Physical debility post stroke PT and OT recommending SNF placement  Polysubstance abuse/ cocaine.  No withdrawal symptoms, continue with close monitoring. Far enough out now so okay to utilize beta-blocker  Diabetes mellitus 2 now on insulin Hemoglobin A1c 10.6 as of 07/04/2020 No preadmission medications documented CBGs over the past 24 hours have ranged between 94 and 188 Continue Levemir 35 units twice daily Continue sliding scale insulin as well as NovoLog 6 units every 4 hours while on tube feedings  HTN Continue metoprolol.     Other problems: Anemia with iron deficiency  sp IV iron supplementation Repeat anemia panel 6 weeks after discharge  UTI (not present on admission) Rocephin x5 days with last dose due on 4/21  Data Reviewed: Basic Metabolic Panel: Recent Labs  Lab 07/27/20 0225 07/28/20 0206 07/29/20 0159 07/30/20 0525 07/31/20 0423  NA 137 134* 133* 135 138  K 4.4 4.2 3.7 4.9 4.2  CL 98 94* 95* 95* 97*  CO2 24 25 28 30 30   GLUCOSE 190* 151* 115* 113* 156*  BUN 8 10 9 11 12   CREATININE 0.57 0.55 0.51 0.59 0.52  CALCIUM 9.9 9.8 9.5 9.6 9.7   Liver Function Tests: No results for input(s): AST, ALT, ALKPHOS, BILITOT, PROT, ALBUMIN in the last 168 hours. No results for input(s): LIPASE, AMYLASE in the last 168 hours. No results for input(s): AMMONIA in the last 168 hours. CBC: Recent Labs  Lab  07/27/20 0225 07/28/20 0206 07/29/20 0159 07/30/20 0525 07/31/20 0423  WBC 21.4* 27.0* 21.2* 14.8* 11.0*  HGB 10.1* 9.6* 9.2* 9.9* 9.9*  HCT 36.3 34.6* 32.0* 35.0* 34.8*  MCV 78.7* 80.5 79.6* 81.4 80.4  PLT 742* 519* 495* 458* 390   Cardiac Enzymes: No results for input(s): CKTOTAL, CKMB, CKMBINDEX, TROPONINI in the last 168 hours. BNP (last 3 results) No results for input(s): BNP in the last 8760 hours.  ProBNP (last 3 results) No results for input(s): PROBNP in the last 8760 hours.  CBG: Recent Labs  Lab 08/01/20 1939 08/01/20 2151 08/01/20 2325 08/02/20 0413 08/02/20 0753  GLUCAP 157* 162* 133* 94 174*    Recent Results (from the past 240 hour(s))  Culture, blood (Routine X 2) w Reflex to ID Panel     Status: None   Collection Time: 07/27/20  3:32 PM   Specimen: BLOOD  Result Value Ref Range Status   Specimen Description BLOOD SITE NOT SPECIFIED  Final   Special Requests   Final    BOTTLES DRAWN AEROBIC ONLY Blood Culture results may not be optimal due to an inadequate volume of blood received in culture bottles   Culture   Final    NO GROWTH 5 DAYS Performed at [facility], [place] [address], [place], [place] [address]    Report Status 08/01/2020 FINAL  Final  Culture, blood (Routine X 2) w Reflex to ID Panel     Status: None   Collection Time: 07/27/20  3:36 PM   Specimen: BLOOD  Result Value Ref Range Status   Specimen Description BLOOD SITE NOT SPECIFIED  Final   Special Requests   Final    BOTTLES DRAWN AEROBIC ONLY Blood Culture results may not be optimal due to an inadequate volume of blood received in culture bottles   Culture   Final    NO GROWTH 5 DAYS Performed at [facility], [place] [address] [place], [place] [address]    Report Status 08/01/2020 FINAL  Final  Culture, Urine     Status: None   Collection Time: 07/28/20  1:00 PM   Specimen: Urine, Random  Result Value Ref Range Status   Specimen Description URINE, RANDOM  Final    Special Requests NONE  Final   Culture   Final    NO GROWTH Performed at [facility], [place] [address] [place], [place] [address]    Report Status 07/29/2020 FINAL  Final     Studies: DG Swallowing Func-Speech Pathology  Result Date: 07/31/2020 Objective Swallowing Evaluation: Type of Study: MBS-Modified Barium Swallow Study  Patient Details Name: HABIBA TRELOAR MRN: [id number] Date of Birth: [birth year] Today's Date: 07/31/2020 Time: SLP Start Time (ACUTE ONLY): 0930 -SLP Stop Time (ACUTE ONLY): 1000 SLP Time Calculation (min) (ACUTE ONLY): 30 min Past Medical History: Past Medical History: Diagnosis Date . Diabetes mellitus without complication ([place])  . Hypertension  Past Surgical History: Past Surgical History: Procedure Laterality Date . BUBBLE STUDY  07/19/2020  Procedure: BUBBLE STUDY;  Surgeon: Donato Heinz, MD;  Location: [place];  Service: Cardiovascular;; . CESAREAN SECTION   . CHOLECYSTECTOMY   . CRANIOTOMY Right 07/06/2020  Procedure: RIGHT CRANIECTOMY WITH PLACEMENT OF BONE FLAP IN ABDOMEN;  Surgeon: Erline Levine, MD;  Location: [place];  Service: Neurosurgery;  Laterality: Right; . ESOPHAGOGASTRODUODENOSCOPY (EGD) WITH PROPOFOL N/A 07/26/2020  Procedure: ESOPHAGOGASTRODUODENOSCOPY (EGD) WITH PROPOFOL;  Surgeon: Georganna Skeans, MD;  Location: [place];  Service: General;  Laterality: N/A; . IR ANGIO INTRA EXTRACRAN SEL COM CAROTID INNOMINATE UNI L MOD SED  07/04/2020 . IR ANGIO VERTEBRAL SEL VERTEBRAL UNI R MOD SED  07/04/2020 . IR CT HEAD LTD  07/04/2020 . IR CT HEAD LTD  07/04/2020 . IR PERCUTANEOUS ART THROMBECTOMY/INFUSION INTRACRANIAL INC DIAG ANGIO  07/04/2020 . PEG PLACEMENT N/A 07/26/2020  Procedure: PERCUTANEOUS ENDOSCOPIC GASTROSTOMY (PEG) PLACEMENT;  Surgeon: Georganna Skeans, MD;  Location: [place];  Service: General;  Laterality: N/A; . RADIOLOGY WITH ANESTHESIA N/A 07/04/2020  Procedure: IR WITH ANESTHESIA;  Surgeon: Radiologist, Medication, MD;  Location: [place];  Service: Radiology;  Laterality: N/A; . TEE WITHOUT CARDIOVERSION N/A 07/19/2020  Procedure: TRANSESOPHAGEAL ECHOCARDIOGRAM (TEE);  Surgeon: Donato Heinz, MD;  Location: [facility] END[facility];  Service: Cardiovascular;  Laterality: N/A; . THROMBECTOMY BRACHIAL ARTERY Right 07/05/2020  Procedure: RIGHT UPPER EXTREMITY THROMBECTOMY;  Surgeon: Marty Heck, MD;  Location: [facility] OR;  Service: Vascular;  Laterality: Right; HPI: 35 yo female admitted with L sided weakness, given TPA for R MCA involving basal ganglia, frontal, temporal lobes  3/23 s/p BIL revascularization  R MCA ACA achieving 2B revascularization. Pt intubated 3/23-31. 3/24 R UE thrombectomy. CT revealed worsening midline shift. 3/25 decompressive hemicranitectomy, R side bone flap in abdomen. PMH current smoker uncontrolled DM HTn  Subjective: -- (adequately awake) Assessment / Plan / Recommendation CHL IP CLINICAL IMPRESSIONS 07/31/2020 Clinical  Impression Pt demonstrates some improvement in swallowing, primarily in airway protection, though severe cognitive impairment and oral dyspahgia persist. Pt is restless and distractible int he MBS suite (improved attention to PO in room yesterday) and needs max verbal and visual cues to attend to PO. All trials were given with a straw, which seems to be her preferred method of sipping. Given fleeting attention she does not always immediately form, propel and swallow bolus; she looses attention intermittently and there is slow delayed movement. She aspirated sips of nectar with sensation before the swallow, but tolerated honey, puree and regular solids without difficulty and adequate strength throughout. Recommend pt initaite a dys 3 mech soft diet with honey thick liquids. Will f/u for tolerance. SLP Visit Diagnosis Dysphagia, oropharyngeal phase (R13.12) Attention and concentration deficit following -- Frontal lobe and executive function deficit following -- Impact on safety and function Moderate  aspiration risk   CHL IP TREATMENT RECOMMENDATION 07/31/2020 Treatment Recommendations Therapy as outlined in treatment plan below   Prognosis 07/20/2020 Prognosis for Safe Diet Advancement Good Barriers to Reach Goals Severity of deficits Barriers/Prognosis Comment -- CHL IP DIET RECOMMENDATION 07/31/2020 SLP Diet Recommendations NPO;Dysphagia 3 (Mech soft) solids;Honey thick liquids Liquid Administration via -- Medication Administration -- Compensations Slow rate;Small sips/bites;Minimize environmental distractions Postural Changes Seated upright at 90 degrees   CHL IP OTHER RECOMMENDATIONS 07/31/2020 Recommended Consults -- Oral Care Recommendations Oral care BID Other Recommendations Order thickener from pharmacy;Have oral suction available   CHL IP FOLLOW UP RECOMMENDATIONS 07/31/2020 Follow up Recommendations Skilled Nursing facility   [facility] IP FREQUENCY AND DURATION 07/31/2020 Speech Therapy Frequency (ACUTE ONLY) min 2x/week Treatment Duration 2 weeks      CHL IP ORAL PHASE 07/31/2020 Oral Phase Impaired Oral - Pudding Teaspoon -- Oral - Pudding Cup -- Oral - Honey Teaspoon -- Oral - Honey Cup Decreased bolus cohesion;Delayed oral transit;Premature spillage;Lingual/palatal residue Oral - Nectar Teaspoon -- Oral - Nectar Cup Decreased bolus cohesion;Delayed oral transit;Premature spillage;Lingual/palatal residue Oral - Nectar Straw Decreased bolus cohesion;Delayed oral transit;Premature spillage;Lingual/palatal residue Oral - Thin Teaspoon -- Oral - Thin Cup -- Oral - Thin Straw -- Oral - Puree Decreased bolus cohesion;Delayed oral transit;Premature spillage;Lingual/palatal residue Oral - Mech Soft -- Oral - Regular Decreased bolus cohesion;Delayed oral transit;Premature spillage;Lingual/palatal residue Oral - Multi-Consistency -- Oral - Pill -- Oral Phase - Comment --  CHL IP PHARYNGEAL PHASE 07/31/2020 Pharyngeal Phase Impaired Pharyngeal- Pudding Teaspoon -- Pharyngeal -- Pharyngeal- Pudding Cup -- Pharyngeal --  Pharyngeal- Honey Teaspoon -- Pharyngeal -- Pharyngeal- Honey Cup Delayed swallow initiation-pyriform sinuses;Penetration/Aspiration before swallow Pharyngeal Material enters airway, remains ABOVE vocal cords then ejected out;Material does not enter airway Pharyngeal- Nectar Teaspoon -- Pharyngeal -- Pharyngeal- Nectar Cup Penetration/Aspiration before swallow;Delayed swallow initiation-pyriform sinuses;Trace aspiration Pharyngeal Material enters airway, passes BELOW cords and not ejected out despite cough attempt by patient;Material enters airway, passes BELOW cords then ejected out;Material does not enter airway Pharyngeal- Nectar Straw -- Pharyngeal -- Pharyngeal- Thin Teaspoon -- Pharyngeal -- Pharyngeal- Thin Cup -- Pharyngeal -- Pharyngeal- Thin Straw -- Pharyngeal -- Pharyngeal- Puree Delayed swallow initiation-pyriform sinuses Pharyngeal -- Pharyngeal- Mechanical Soft -- Pharyngeal -- Pharyngeal- Regular -- Pharyngeal -- Pharyngeal- Multi-consistency -- Pharyngeal -- Pharyngeal- Pill -- Pharyngeal -- Pharyngeal Comment --  CHL IP CERVICAL ESOPHAGEAL PHASE 07/20/2020 Cervical Esophageal Phase WFL Pudding Teaspoon -- Pudding Cup -- Honey Teaspoon -- Honey Cup -- Nectar Teaspoon -- Nectar Cup -- Nectar Straw -- Thin Teaspoon -- Thin Cup -- Thin Straw -- Puree -- Mechanical Soft --  Regular -- Multi-consistency -- Pill -- Cervical Esophageal Comment -- Herbie Baltimore, MA CCC-SLP Acute Rehabilitation Services Pager [phone] Office [phone] Lynann Beaver 07/31/2020, 10:47 AM               Scheduled Meds: . apixaban  5 mg Per Tube BID  . atorvastatin  40 mg Per Tube q1800  . chlorhexidine gluconate (MEDLINE KIT)  15 mL Mouth Rinse BID  . Chlorhexidine Gluconate Cloth  6 each Topical Daily  . docusate  100 mg Per Tube BID  . free water  200 mL Per Tube Q6H  . gabapentin  600 mg Per Tube Q8H  . insulin aspart  0-15 Units Subcutaneous Q4H  . insulin aspart  6 Units Subcutaneous Q4H  .  insulin detemir  35 Units Subcutaneous BID  . mouth rinse  15 mL Mouth Rinse q12n4p  . melatonin  3 mg Per Tube QHS  . metoprolol tartrate  50 mg Per Tube BID  . multivitamin with minerals  1 tablet Per Tube Daily  . polyethylene glycol  17 g Per Tube Daily  . sodium chloride flush  10-40 mL Intracatheter Q12H   Continuous Infusions: . sodium chloride Stopped (07/13/20 1028)  . sodium chloride Stopped (07/26/20 1046)  . 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, without long-term current use of insulin (H[facility])   Benign essential hypertension   Acute occlusion of artery of upper extremity due to thrombus ([place])   Dysphagia, pharyngoesophageal phase   Consultants:  Neurology  General surgery  Procedures:  Percutaneous endoscopic gastrostomy tube  Echocardiogram  Core track tube  Right upper extremity thrombectomy  Right craniectomy with placement of bone flap  Antibiotics: Anti-infectives (From admission, onward)   Start     Dose/Rate Route Frequency Ordered Stop   07/28/20 1600  cefTRIAXone (ROCEPHIN) 2 g in sodium chloride 0.9 % 100 mL IVPB        2 g 200 mL/hr over 30 Minutes Intravenous Every 24 hours 07/28/20 1506 08/01/20 1900   07/08/20 0900  cefTRIAXone (ROCEPHIN) 2 g in sodium chloride 0.9 % 100 mL IVPB        2 g 200 mL/hr over 30 Minutes Intravenous Every 24 hours 07/08/20 0801 07/14/20 1132   07/08/20 0900  vancomycin (VANCOCIN) 1,750 mg in sodium chloride 0.9 % 500 mL IVPB        1,750 mg 250 mL/hr over 120 Minutes Intravenous  Once 07/08/20 0802 07/08/20 1149   07/06/20 2000  ceFAZolin (ANCEF) IVPB 1 g/50 mL premix        1 g 100 mL/hr over 30 Minutes Intravenous Every 8 hours 07/06/20 1400  07/07/20 0508   07/06/20 0945  ceFAZolin (ANCEF) IVPB 2g/100 mL premix        2 g 200 mL/hr over 30 Minutes Intravenous To Surgery 07/06/20 0855 07/07/20 0945   07/04/20 1541  ceFAZolin (ANCEF) 2-4 GM/100ML-% IVPB       Note to Pharmacy: Desiree Hane   : cabinet override      07/04/20 1541 07/05/20 0344       Time spent: 20 minutes    Erin Hearing ANP  [facility] 7 am - 330 pm/M-F for direct patient care and secure  chat Please refer to Amion for contact info 29  days

## 2020-08-02 NOTE — Progress Notes (Addendum)
2pm: CSW spoke with Lajean Manes at [facility] who states the facility is not in network with the patient's insurance.  12pm: CSW attempted to reach [facility] again, no answer.  10am: CSW has attempted to reach Ant[facility] in admissions at [facility] twice without success - a voicemail was left and a text was sent requesting a return call.  Edwin Dada, MSW, LCSW Transitions of Care  Clinical Social Worker II [phone]

## 2020-08-03 DIAGNOSIS — I10 Essential (primary) hypertension: Secondary | ICD-10-CM | POA: Diagnosis not present

## 2020-08-03 DIAGNOSIS — I749 Embolism and thrombosis of unspecified artery: Secondary | ICD-10-CM | POA: Diagnosis not present

## 2020-08-03 DIAGNOSIS — I742 Embolism and thrombosis of arteries of the upper extremities: Secondary | ICD-10-CM | POA: Diagnosis not present

## 2020-08-03 DIAGNOSIS — R1314 Dysphagia, pharyngoesophageal phase: Secondary | ICD-10-CM | POA: Diagnosis not present

## 2020-08-03 LAB — GLUCOSE, CAPILLARY
Glucose-Capillary: 98 mg/dL (ref 70–99)
Glucose-Capillary: 140 mg/dL — ABNORMAL HIGH (ref 70–99)
Glucose-Capillary: 184 mg/dL — ABNORMAL HIGH (ref 70–99)

## 2020-08-03 LAB — MISC LABCORP TEST (SEND OUT): Labcorp test code: 489150

## 2020-08-03 LAB — SARS CORONAVIRUS 2 (TAT 6-24 HRS): SARS Coronavirus 2: NEGATIVE

## 2020-08-03 MED ORDER — ACETAMINOPHEN 160 MG/5ML PO SOLN: 650.0000 mg | ORAL | 0 refills | Status: DC | PRN

## 2020-08-03 MED ORDER — ATORVASTATIN CALCIUM 40 MG PO TABS
40.0000 mg | ORAL_TABLET | Freq: Every day | ORAL | Status: DC
Start: 2020-08-03 — End: 2022-10-04

## 2020-08-03 MED ORDER — MELATONIN 3 MG PO TABS: 3.0000 mg | ORAL_TABLET | Freq: Every day | ORAL | 0 refills | Status: DC

## 2020-08-03 MED ORDER — VALPROIC ACID 250 MG/5ML PO SOLN: 250.0000 mg | Freq: Two times a day (BID) | ORAL | Status: DC

## 2020-08-03 MED ORDER — ACETAMINOPHEN 325 MG PO TABS: 650.0000 mg | ORAL_TABLET | ORAL | Status: DC | PRN

## 2020-08-03 MED ORDER — ADULT MULTIVITAMIN W/MINERALS CH: 1.0000 | ORAL_TABLET | Freq: Every day | ORAL | Status: DC

## 2020-08-03 MED ORDER — INSULIN ASPART 100 UNIT/ML ~~LOC~~ SOLN: 6.0000 [IU] | SUBCUTANEOUS | 11 refills | Status: DC

## 2020-08-03 MED ORDER — INSULIN DETEMIR 100 UNIT/ML ~~LOC~~ SOLN: 35.0000 [IU] | Freq: Two times a day (BID) | SUBCUTANEOUS | 11 refills | Status: DC

## 2020-08-03 MED ORDER — INSULIN ASPART 100 UNIT/ML ~~LOC~~ SOLN: 0.0000 [IU] | SUBCUTANEOUS | 11 refills | Status: DC

## 2020-08-03 MED ORDER — SENNOSIDES-DOCUSATE SODIUM 8.6-50 MG PO TABS: 1.0000 | ORAL_TABLET | Freq: Every evening | ORAL | Status: DC | PRN

## 2020-08-03 MED ORDER — METOPROLOL TARTRATE 50 MG PO TABS: 50.0000 mg | ORAL_TABLET | Freq: Two times a day (BID) | ORAL | Status: DC

## 2020-08-03 MED ORDER — POLYETHYLENE GLYCOL 3350 17 G PO PACK: 17.0000 g | PACK | Freq: Every day | ORAL | 0 refills | Status: DC

## 2020-08-03 MED ORDER — GABAPENTIN 250 MG/5ML PO SOLN: 600.0000 mg | Freq: Three times a day (TID) | ORAL | 12 refills | Status: DC

## 2020-08-03 MED ORDER — FREE WATER: 200.0000 mL | Freq: Four times a day (QID) | Status: DC

## 2020-08-03 MED ORDER — GLUCERNA 1.5 CAL PO LIQD: 1000.0000 mL | ORAL | Status: DC

## 2020-08-03 MED ORDER — SERTRALINE HCL 25 MG PO TABS: 25.0000 mg | ORAL_TABLET | Freq: Every day | ORAL | Status: DC

## 2020-08-03 MED ORDER — BISACODYL 10 MG RE SUPP: 10.0000 mg | Freq: Every day | RECTAL | 0 refills | Status: DC | PRN

## 2020-08-03 MED ORDER — DOCUSATE SODIUM 50 MG/5ML PO LIQD: 100.0000 mg | Freq: Two times a day (BID) | ORAL | 0 refills | Status: DC

## 2020-08-03 MED ORDER — APIXABAN 5 MG PO TABS: 5.0000 mg | ORAL_TABLET | Freq: Two times a day (BID) | ORAL | Status: DC

## 2020-08-03 NOTE — Plan of Care (Signed)
  Problem: Education: Goal: Knowledge of General Education information will improve Description: Including pain rating scale, medication(s)/side effects and non-pharmacologic comfort measures Outcome: Progressing   Problem: Clinical Measurements: Goal: Ability to maintain clinical measurements within normal limits will improve Outcome: Progressing Goal: Will remain free from infection Outcome: Progressing Goal: Diagnostic test results will improve Outcome: Progressing Goal: Respiratory complications will improve Outcome: Progressing Goal: Cardiovascular complication will be avoided Outcome: Progressing   Problem: Safety: Goal: Ability to remain free from injury will improve Outcome: Progressing   Problem: Safety: Goal: Non-violent Restraint(s) Outcome: Progressing

## 2020-08-03 NOTE — Discharge Summary (Signed)
Physician Discharge Summary  Brandi Myers:[id number] DOB: [birth year] DOA: 07/04/2020  PCP: [name]  Admit date: 07/04/2020 Discharge date: 08/03/2020  Time spent: 35 minutes  Recommendations for Outpatient Follow-up:  1. Please assist patient and arrange for follow-up appointments with neurology/Dr. [name], neurosurgery Dr. Venetia Maxon, and vascular surgery Dr. Chestine Spore. 2. Patient will discharge to [facility] nursing facility. 3. Patient will need to continue physical therapy, Occupational Therapy and speech therapy with focus on swallowing and linguistics. 4. Patient was started on Depakote to assist with impulsive behaviors.  Recommend obtain LFTs in 2 weeks after arrival 5. Patient was given IV iron during the hospitalization for iron deficiency anemia.  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  Patient is also on tube feedings/Glucerna at 50 cc/h until she is able to tolerate adequate oral intake.  Patient needs assistance with feeding due to hemiplegia.  Filed Weights   07/23/20 0541 07/27/20 0527 08/02/20 0500  Weight: 69.7 kg 72.8 kg 72.4 kg    History of present illness:  35 year old female admitted to the hospital with a working diagnosis of right MCA embolic stroke, complicated with right hand arterial thrombosis and systemic  inflammatory response syndrome. She was found on the bottom of the tub at her homewith slurred speech and difficulty moving her left side. In the emergency department at [facility] was diagnosed with acute right MCA territory infarct. She had significant agitation, she was sedated and intubated, placed on mechanical ventilation. CT angiography showed right CEA occlusion as well as right common carotid artery bifurcation thrombus extending into the external carotid. Transferred to [facility] for IR intervention.  03/23underwent cerebral angiogram with endovascular revascularization of occluded right ICA. 03/24 right upper extremity thrombectomy.  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  Patient liberated from mechanical ventilation is 3/31.  Work-up underwent for endocarditis which was negative.   Continue to have severe deconditioning, swallow dysfunction, PEG tube was placed 4/14 and transferred to [facility] 4/17.  Hospital Course:  Acute problems: Acute ischemic CVA, right ICA thrombus. Positive hemorrhagic conversion with malignant cerebral edema and brain herniation sp hemicraniectomy.Encephalopathy post ischemic/hemorrhagic event.  Prolonged hospitalization,persistentleft sided hemiparesis with associated dysphagia and physical debility Continueapixaban. Continueatorvastatin 40 mg daily. Pending placement SNF, patient is medically stable for discharge to SNF. Continue Zoloft and twice daily Depakene for behavior management/impulsive behavior.  Check LFTs in 2 weeks  Acute ischemic right upper extremity sp thrombectomy/right ulnar occlusion per [place] 4/3.  Continue anticoagulation with apixaban. Continue Neurontin 600 mg every 8 hours-if ineffective can transition to Lyrica     Dysphagia Nutrition Problem: Inadequate oral intake Etiology: inability to eat Signs/Symptoms: NPO status-SLP approved E3  mechanical soft diet with honey thick liquids as of 4/19 after patient underwent modified barium swallow study Interventions: Tube feeding Estimated body mass index is 26.56 kg/m  as calculated from the following:   Height as of this encounter: 5\' 5"  (1.651 m).   Weight as of this encounter: 72.4 kg.   Physical debility post stroke PT and OT recommending SNF placement  Polysubstance abuse/ cocaine.  No withdrawalsymptoms,continue with close monitoring. Far enough out now so okay to utilize beta-blocker  Diabetes mellitus 2 now on insulin Hemoglobin A1c 10.6 as of 07/04/2020 No preadmission medications documented CBGs over the past 24 hours have ranged between 98 and 157 Continue Levemir 35 units twice daily Continue sliding scale insulin as well as NovoLog 6 units every 4 hours while on tube feedings  HTN Continue metoprolol.    Other problems: Anemia with iron deficiency sp IV iron supplementation Repeat anemia panel 6 weeks after discharge  UTI (not present on admission) Rocephin x5 days with last dose due on 4/21  Procedures:  Percutaneous endoscopic gastrostomy tube  Echocardiogram  Core track tube  Right upper extremity thrombectomy  Right craniectomy with placement of bone flap   Consultations:  Neurology  General surgery  Vascular surgery  Neurosurgery  Discharge Exam: Vitals:   08/03/20 0424 08/03/20 0817  BP: 101/67 119/83  Pulse: (!) 104 (!) 104  Resp: 18 17  Temp: 97.9 F (36.6 C) 98.4 F (36.9 C)  SpO2: 94% 97%   Constitutional: Alert, pleasant and calm.  No acute distress Respiratory: Lung sounds are clear.  Stable on room air Cardiovascular: Normal heart sounds, normotensive.  No peripheral edema. Abdomen: Soft nontender.  PEG tube unremarkable with tube feeding infusing Skin: Has necrotic area right thumb with an adjacent arterial ulcer (see pictures) Neurologic: CN 2-12 grossly intact except for facial droop on the  left, slurred speech, and left hemiparesis Psychiatric: Alert and oriented times name only.    Not restless today.  Bland flat affect.  Discharge Instructions   Discharge Instructions    Ambulatory referral to Neurology   Complete by: As directed    [facility]. Too complicated for NP to follow. Thanks.   Call MD for:  redness, tenderness, or signs of infection (pain, swelling, redness, odor or green/yellow discharge around incision site)   Complete by: As directed    Call MD for:  severe uncontrolled pain   Complete by: As directed    Call MD for:  temperature >100.4   Complete by: As directed    Diet Carb Modified   Complete by: As directed    Dysphagia 3 with honey thick liquids   Discharge wound care:   Complete by: As directed    Monitor superficial necrosis on the right hand and notify vascular surgeon Dr. Chestine Sporelark if any concerns.   Increase activity slowly   Complete by: As directed      Allergies as of 08/03/2020      Reactions   Norco [hydrocodone-acetaminophen] Itching, Nausea And Vomiting, Swelling      Medication List    TAKE these medications   acetaminophen 325 MG tablet Commonly known as: TYLENOL Take 2 tablets (650 mg total) by mouth every 4 (four) hours as needed for mild pain (or temp > 37.5 C (99.5 F)).   acetaminophen 160 MG/5ML solution Commonly known as: TYLENOL Place 20.3 mLs (650 mg total) into feeding tube every 4 (four) hours as needed for mild pain (or temp > 37.5 C (99.5 F)).   apixaban 5 MG Tabs tablet Commonly known as: ELIQUIS Place 1 tablet (5 mg total) into feeding tube  2 (two) times daily.   atorvastatin 40 MG tablet Commonly known as: LIPITOR Place 1 tablet (40 mg total) into feeding tube daily at 6 PM.   bisacodyl 10 MG suppository Commonly known as: DULCOLAX Place 1 suppository (10 mg total) rectally daily as needed for moderate constipation.   docusate 50 MG/5ML liquid Commonly known as:  COLACE Place 10 mLs (100 mg total) into feeding tube 2 (two) times daily.   feeding supplement (GLUCERNA 1.5 CAL) Liqd Place 1,000 mLs into feeding tube continuous.   free water Soln Place 200 mLs into feeding tube every 6 (six) hours.   gabapentin 250 MG/5ML solution Commonly known as: NEURONTIN Place 12 mLs (600 mg total) into feeding tube every 8 (eight) hours.   insulin aspart 100 UNIT/ML injection Commonly known as: novoLOG Inject 0-15 Units into the skin every 4 (four) hours. Correction coverage: Moderate (average weight, post-op)  CBG < 70: Implement Hypoglycemia Standing Orders and refer to Hypoglycemia Standing Orders sidebar report  CBG 70 - 120: 0 units  CBG 121 - 150: 2 units  CBG 151 - 200: 3 units  CBG 201 - 250: 5 units  CBG 251 - 300: 8 units  CBG 301 - 350: 11 units  CBG 351 - 400: 15 units  CBG > 400 call MD and obtain STAT lab verification   insulin aspart 100 UNIT/ML injection Commonly known as: novoLOG Inject 6 Units into the skin every 4 (four) hours.   insulin detemir 100 UNIT/ML injection Commonly known as: LEVEMIR Inject 0.35 mLs (35 Units total) into the skin 2 (two) times daily.   melatonin 3 MG Tabs tablet Place 1 tablet (3 mg total) into feeding tube at bedtime.   metoprolol tartrate 50 MG tablet Commonly known as: LOPRESSOR Place 1 tablet (50 mg total) into feeding tube 2 (two) times daily.   multivitamin with minerals Tabs tablet Place 1 tablet into feeding tube daily. Start taking on: August 04, 2020   polyethylene glycol 17 g packet Commonly known as: MIRALAX / GLYCOLAX Place 17 g into feeding tube daily. Start taking on: August 04, 2020   senna-docusate 8.6-50 MG tablet Commonly known as: Senokot-S Place 1 tablet into feeding tube at bedtime as needed for mild constipation.   sertraline 25 MG tablet Commonly known as: ZOLOFT Place 1 tablet (25 mg total) into feeding tube daily. Start taking on: August 04, 2020   valproic acid  250 MG/5ML solution Commonly known as: DEPAKENE Place 5 mLs (250 mg total) into feeding tube 2 (two) times daily.            Discharge Care Instructions  (From admission, onward)         Start     Ordered   08/03/20 0000  Discharge wound care:       Comments: Monitor superficial necrosis on the right hand and notify vascular surgeon Dr. Chestine Spore if any concerns.   08/03/20 1018         Allergies  Allergen Reactions  . Norco [Hydrocodone-Acetaminophen] Itching, Nausea And Vomiting and Swelling    Follow-up Information    Micki Riley, MD. Schedule an appointment as soon as possible for a visit in 4 week(s).   Specialties: Neurology, Radiology Contact information: [address] [place] [address] [phone]        Maeola Harman, MD. Schedule an appointment as soon as possible for a visit in 4 week(s).   Specialty: Neurosurgery Why: Please make appointment for the  patient with the neurosurgical team for routine post hospital follow-up. Contact information: [address] [place] [address] [phone]        Cephus Shelling, MD. Schedule an appointment as soon as possible for a visit in 3 week(s).   Specialty: Vascular Surgery Why: Please call to arrange for routine posthospital follow-up Contact information: [address] [place] [address] [phone]                The results of significant diagnostics from this hospitalization (including imaging, microbiology, ancillary and laboratory) are listed below for reference.    Significant Diagnostic Studies: CT Angio Head W/Cm &/Or Wo Cm  Result Date: 07/04/2020 CLINICAL DATA:  Left-sided weakness and slurred speech EXAM: CT HEAD WITHOUT CONTRAST CT ANGIOGRAPHY HEAD AND NECK CT PERFUSION BRAIN TECHNIQUE: Multi detector CT imaging of the head was performed using the standard protocol without intravenous contrast. Multidetector CT imaging of the head and neck  was performed using the standard protocol during bolus administration of intravenous contrast. Multiplanar CT image reconstructions and MIPs were obtained to evaluate the vascular anatomy. Carotid stenosis measurements (when applicable) are obtained utilizing NASCET criteria, using the distal internal carotid diameter as the denominator. Multiphase CT imaging of the brain was performed following IV bolus contrast injection. Subsequent parametric perfusion maps were calculated using RAPID software. CONTRAST:  OMNIPAQUE IOHEXOL 350 MG/ML SOLN COMPARISON:  None. FINDINGS: CT HEAD FINDINGS Brain: There is no acute intracranial hemorrhage or mass effect. Loss of gray-white differentiation involving a portion of the right lentiform nucleus. Ventricles and sulci are normal in size and configuration. No extra-axial collection. Vascular: No hyperdense vessel. Skull: Unremarkable. Sinuses/Orbits: Lobular maxillary sinus mucosal thickening. Orbits are unremarkable. Other: Minor patchy left mastoid tip opacification. ASPECTS [facility] Stroke Program Early CT Score) - Ganglionic level infarction (caudate, lentiform nuclei, internal capsule, insula, M1-M3 cortex): 6 - Supraganglionic infarction (M4-M6 cortex): 3 Total score (0-10 with 10 being normal): 9 CTA NECK FINDINGS Aortic arch: Normal caliber aorta.  Great vessel origins are patent. Right carotid system: Common carotid is patent. There is thrombus at the bifurcation extending into the external carotid. Less than 50% stenosis at the ICA origin. The cervical ICA is relatively smaller in caliber. Left carotid system: Patent.  No stenosis. Vertebral arteries: Patent. Right vertebral artery slightly dominant. No stenosis. Skeleton: Unremarkable. Other neck: Thyroid not well evaluated due to streak artifact. Likely reactive cervical lymph nodes. Upper chest: Included lungs are clear. Endotracheal tube is present. Review of the MIP images confirms the above findings CTA HEAD  FINDINGS Anterior circulation: Proximal intracranial internal carotid artery is patent. There is loss of enhancement at the level of the supraclinoid portion. Faint enhancement is seen along the right M1 MCA. There is reconstitution of M2 branches. Patent right A1 ACA. Anterior communicating artery is present. Left anterior and middle cerebral arteries are patent. Posterior circulation: Intracranial vertebral arteries, basilar artery, and posterior cerebral arteries are patent. Venous sinuses: As permitted by contrast timing, patent. Review of the MIP images confirms the above findings CT Brain Perfusion Findings: CBF (<30%) Volume: 77mL Perfusion (Tmax>6.0s) volume: Mismatch Volume: 27mL Infarction Location: Right MCA territory IMPRESSION: No acute intracranial hemorrhage. 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 Reconstitution of right MCA at the  bifurcation. Right A1 ACA is patent. Anterior communicating artery is present. No posterior communicating artery is identified. Perfusion imaging demonstrates core infarction of 51 mL. This is greater than anticipated by noncontrast CT. Penumbra is 63 mL and therefore may be greater than calculated. Initial results were called by telephone at the time of interpretation on 07/04/2020 at 1:58 pm to provider Dr. Iver Nestle, who verbally acknowledged these results. Electronically Signed   By: Guadlupe Spanish M.D.   On: 07/04/2020 14:10   DG Chest 1 View  Result Date: 07/13/2020 CLINICAL DATA:  Status post extubation EXAM: CHEST  1 VIEW COMPARISON:  July 07, 2020 FINDINGS: Endotracheal tube has been removed. Enteric tube tip is below the diaphragm. Central catheter tip is in the right atrium. No pneumothorax. There is  atelectatic change in the left perihilar region. Lungs elsewhere are clear. Heart size and pulmonary vascularity normal. No adenopathy. No bone lesions. IMPRESSION: Tube and catheter positions as described without pneumothorax. Left perihilar atelectasis. Lungs elsewhere clear. Heart size normal. Electronically Signed   By: Bretta Bang III M.D.   On: 07/13/2020 07:59   DG Chest 1 View  Result Date: 07/07/2020 CLINICAL DATA:  Pneumonia EXAM: CHEST  1 VIEW COMPARISON:  07/06/2020 FINDINGS: Endotracheal tube terminates 6 cm above the carina. Very mild bibasilar opacities, likely atelectasis. No pleural effusion or pneumothorax. The heart is normal in size. Left arm PICC terminates the cavoatrial junction. IMPRESSION: Endotracheal tube terminates 6 cm above the carina. Electronically Signed   By: Charline Bills M.D.   On: 07/07/2020 09:28   DG Chest 1 View  Result Date: 07/04/2020 CLINICAL DATA:  Intubation EXAM: CHEST  1 VIEW COMPARISON:  01/12/2016 FINDINGS: Endotracheal tube is 3.6 cm above the carina. NG tube is in the stomach. No confluent opacities or effusions. Heart is normal size. No acute bony abnormality. IMPRESSION: Support devices in expected position. No acute cardiopulmonary disease. Electronically Signed   By: Charlett Nose M.D.   On: 07/04/2020 15:09   CT HEAD WO CONTRAST  Result Date: 07/14/2020 CLINICAL DATA:  Stroke follow-up EXAM: CT HEAD WITHOUT CONTRAST TECHNIQUE: Contiguous axial images were obtained from the base of the skull through the vertex without intravenous contrast. COMPARISON:  07/07/2020 FINDINGS: Brain: Large right MCA territory infarct with fading hemorrhage. Similar degree of edema and swelling with bulging of the brain through the craniectomy defect. No hydrocephalus. Midline shift measures up to 3 mm, and improvement from 6 mm previously. Vascular: Stable Skull: Unremarkable right craniectomy Sinuses/Orbits: Partial bilateral mastoid opacification in the setting  of nasal intubation. Improved sinus aeration. IMPRESSION: 1. Evolving right MCA territory infarct with slightly decreased swelling and midline shift. 2. Improved sinus inflammation. 3. No new abnormality. Electronically Signed   By: Marnee Spring M.D.   On: 07/14/2020 04:13   CT HEAD WO CONTRAST  Result Date: 07/07/2020 CLINICAL DATA:  Stroke follow-up EXAM: CT HEAD WITHOUT CONTRAST TECHNIQUE: Contiguous axial images were obtained from the base of the skull through the vertex without intravenous contrast. COMPARISON:  Yesterday FINDINGS: Brain: Decompressive craniectomy with improved midline shift and better drainage of the left lateral ventricle. Midline shift now measures 5 mm. Right MCA territory infarct affecting cortex and subcortical structures with patchy hazy hemorrhage that is non progressed. No new infarct is seen. Vascular: No hyperdense vessel or unexpected calcification. Skull: Unremarkable decompressive craniectomy on the right Sinuses/Orbits: Nasal septal perforation. Generalized mucosal thickening in the paranasal sinuses. IMPRESSION: Improved midline shift and normalized left lateral ventricular volume  after decompressive craniectomy. No progressive infarct or hemorrhage Electronically Signed   By: Marnee Spring M.D.   On: 07/07/2020 06:18   CT HEAD WO CONTRAST  Result Date: 07/06/2020 CLINICAL DATA:  Follow-up intracranial hemorrhage EXAM: CT HEAD WITHOUT CONTRAST TECHNIQUE: Contiguous axial images were obtained from the base of the skull through the vertex without intravenous contrast. COMPARISON:  Yesterday FINDINGS: Brain: Cytotoxic edema involving cortex and basal ganglia in the right MCA territory with hazy central hemorrhage that is non progressed. Midline shift measures up to 1 cm. Unchanged dilatation of the left lateral ventricle attributed to the degree of midline shift and mass effect, with transependymal flow. Vascular: Stable Skull: Stable Sinuses/Orbits: Nasal septal  perforation and patchy sinus opacification. IMPRESSION: 1. Unchanged right cerebral cytotoxic edema with central hemorrhage. Midline shift is similar at 1 cm. 2. Unchanged left lateral ventricle dilatation with evidence of transependymal flow. Electronically Signed   By: Marnee Spring M.D.   On: 07/06/2020 08:19   CT HEAD WO CONTRAST  Result Date: 07/05/2020 CLINICAL DATA:  Follow-up examination for acute stroke. EXAM: CT HEAD WITHOUT CONTRAST TECHNIQUE: Contiguous axial images were obtained from the base of the skull through the vertex without intravenous contrast. COMPARISON:  Comparison made with prior MRI from earlier the same day. FINDINGS: Brain: Continued interval evolution of right MCA distribution infarct, stable from prior MRI. Associated hemorrhagic transformation with hyperdense blood products throughout the area of infarction. Worsened regional mass effect and edema, with progressive effacement of the right lateral ventricle. Associated right-to-left midline shift has increased now measuring up to 9 mm. Increased dilatation of the right lateral ventricle concerning for developing ventricular trapping. Basilar cisterns remain patent. No transtentorial herniation. No other acute large vessel territory infarct or hemorrhage. No extra-axial collection. Vascular: No definite hyperdense vessel. Skull: Scalp soft tissues and calvarium within normal limits. Sinuses/Orbits: Visualized globes and orbital soft tissues within normal limits. Scattered mucosal thickening throughout the ethmoidal air cells, maxillary sinuses, and sphenoid sinuses. Small bilateral mastoid effusions noted. Other: None. IMPRESSION: 1. Continued interval evolution of right MCA distribution infarct with associated hemorrhagic transformation. Worsened regional mass effect and edema with increased right-to-left midline shift now measuring up to 9 mm. Increased dilatation of the right lateral ventricle concerning for developing  ventricular trapping. 2. No other new acute intracranial abnormality. Critical Value/emergent results were called by telephone at the time of interpretation on 07/05/2020 at 11:05 pm to provider Dr. Wilford Corner, who verbally acknowledged these results. Electronically Signed   By: Rise Mu M.D.   On: 07/05/2020 23:06   CT Angio Neck W and/or Wo Contrast  Result Date: 07/04/2020 CLINICAL DATA:  Left-sided weakness and slurred speech EXAM: CT HEAD WITHOUT CONTRAST CT ANGIOGRAPHY HEAD AND NECK CT PERFUSION BRAIN TECHNIQUE: Multi detector CT imaging of the head was performed using the standard protocol without intravenous contrast. Multidetector CT imaging of the head and neck was performed using the standard protocol during bolus administration of intravenous contrast. Multiplanar CT image reconstructions and MIPs were obtained to evaluate the vascular anatomy. Carotid stenosis measurements (when applicable) are obtained utilizing NASCET criteria, using the distal internal carotid diameter as the denominator. Multiphase CT imaging of the brain was performed following IV bolus contrast injection. Subsequent parametric perfusion maps were calculated using RAPID software. CONTRAST:  OMNIPAQUE IOHEXOL 350 MG/ML SOLN COMPARISON:  None. FINDINGS: CT HEAD FINDINGS Brain: There is no acute intracranial hemorrhage or mass effect. Loss of gray-white differentiation involving a portion of the right lentiform  nucleus. Ventricles and sulci are normal in size and configuration. No extra-axial collection. Vascular: No hyperdense vessel. Skull: Unremarkable. Sinuses/Orbits: Lobular maxillary sinus mucosal thickening. Orbits are unremarkable. Other: Minor patchy left mastoid tip opacification. ASPECTS [facility] Stroke Program Early CT Score) - Ganglionic level infarction (caudate, lentiform nuclei, internal capsule, insula, M1-M3 cortex): 6 - Supraganglionic infarction (M4-M6 cortex): 3 Total score (0-10 with 10 being  normal): 9 CTA NECK FINDINGS Aortic arch: Normal caliber aorta.  Great vessel origins are patent. Right carotid system: Common carotid is patent. There is thrombus at the bifurcation extending into the external carotid. Less than 50% stenosis at the ICA origin. The cervical ICA is relatively smaller in caliber. Left carotid system: Patent.  No stenosis. Vertebral arteries: Patent. Right vertebral artery slightly dominant. No stenosis. Skeleton: Unremarkable. Other neck: Thyroid not well evaluated due to streak artifact. Likely reactive cervical lymph nodes. Upper chest: Included lungs are clear. Endotracheal tube is present. Review of the MIP images confirms the above findings CTA HEAD FINDINGS Anterior circulation: Proximal intracranial internal carotid artery is patent. There is loss of enhancement at the level of the supraclinoid portion. Faint enhancement is seen along the right M1 MCA. There is reconstitution of M2 branches. Patent right A1 ACA. Anterior communicating artery is present. Left anterior and middle cerebral arteries are patent. Posterior circulation: Intracranial vertebral arteries, basilar artery, and posterior cerebral arteries are patent. Venous sinuses: As permitted by contrast timing, patent. Review of the MIP images confirms the above findings CT Brain Perfusion Findings: CBF (<30%) Volume: 51mL Perfusion (Tmax>6.0s) volume: Mismatch Volume: 63mL Infarction Location: Right MCA territory IMPRESSION: No acute intracranial hemorrhage. 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 Reconstitution of right MCA at the bifurcation. Right A1 ACA is patent. Anterior communicating artery is present. No posterior communicating artery is identified.  Perfusion imaging demonstrates core infarction of 51 mL. This is greater than anticipated by noncontrast CT. Penumbra is 63 mL and therefore may be greater than calculated. Initial results were called by telephone at the time of interpretation on 07/04/2020 at 1:58 pm to provider Dr. Iver Nestle, who verbally acknowledged these results. Electronically Signed   By: Guadlupe Spanish M.D.   On: 07/04/2020 14:10   CT ANGIO CHEST PE W OR WO CONTRAST  Result Date: 07/28/2020 CLINICAL DATA:  Pulmonary embolism post treatment. EXAM: CT ANGIOGRAPHY CHEST WITH CONTRAST TECHNIQUE: Multidetector CT imaging of the chest was performed using the standard protocol during bolus administration of intravenous contrast. Multiplanar CT image reconstructions and MIPs were obtained to evaluate the vascular anatomy. CONTRAST:  OMNIPAQUE IOHEXOL 350 MG/ML SOLN COMPARISON:  06/26/2007 FINDINGS: Cardiovascular: Heart size is normal. No pericardial effusion. No significant atherosclerotic calcification coronary artery 9 for thoracic aorta. No aneurysm. The pulmonary arteries are well opacified and there is no evidence for acute pulmonary embolus. Mediastinum/Nodes: The visualized portion of the thyroid gland has a normal appearance. No significant mediastinal, hilar, or axillary adenopathy. Esophagus is unremarkable. Lungs/Pleura: Small bilateral pleural effusions and minimal bibasilar atelectasis. Airways are patent. Upper Abdomen: Liver is diffusely low attenuation, consistent with fatty infiltration. Cholecystectomy. Musculoskeletal: No chest wall abnormality. No acute or significant osseous findings. Review of the MIP images confirms the above findings. IMPRESSION: 1. Technically adequate exam showing no acute pulmonary embolus. 2. Small bilateral  pleural effusions and minimal bibasilar atelectasis. 3. Hepatic steatosis. 4. Cholecystectomy. Electronically Signed   By: Norva Pavlov M.D.   On: 07/28/2020 12:42   MR ANGIO HEAD WO  CONTRAST  Result Date: 07/05/2020 CLINICAL DATA:  Stroke.  Post thrombectomy and tPA EXAM: MRI HEAD WITHOUT CONTRAST MRA HEAD WITHOUT CONTRAST TECHNIQUE: Multiplanar, multiecho pulse sequences of the brain and surrounding structures were obtained without intravenous contrast. Angiographic images of the head were obtained using MRA technique without contrast. COMPARISON:  CT angio head and neck 07/04/2020 FINDINGS: MRI HEAD FINDINGS Brain: Acute infarct right MCA territory. This involves much of the right basal ganglia as well as the right frontal and temporal lobe. There is hemorrhagic transformation with mild-to-moderate blood in the infarct. There is local mass-effect and midline shift of 6 mm. No hydrocephalus. No evidence of chronic ischemia. No mass lesion. Vascular: Normal arterial flow voids at the base of brain. Skull and upper cervical spine: No focal skeletal abnormality. Sinuses/Orbits: Moderate mucosal edema paranasal sinuses. Bilateral mastoid effusion normal orbit Other: None MRA HEAD FINDINGS Both vertebral arteries are normal. Left PICA patent. Large right AICA patent. Basilar patent. Superior cerebellar and posterior cerebral arteries normal bilaterally. Internal carotid artery is widely patent bilaterally without stenosis or filling defect. Anterior and middle cerebral arteries widely patent bilaterally without stenosis or occlusion. No aneurysm. 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 Hemorrhagic transformation with mild to moderate hemorrhage in the infarct. There is mass-effect and 6 mm midline shift to left Negative MRA head Electronically Signed   By: Marlan Palau M.D.   On: 07/05/2020 10:01   MR BRAIN WO CONTRAST  Result Date: 07/05/2020 CLINICAL DATA:  Stroke.  Post thrombectomy and tPA EXAM: MRI HEAD WITHOUT CONTRAST MRA HEAD WITHOUT CONTRAST TECHNIQUE: Multiplanar, multiecho pulse sequences of the brain and surrounding  structures were obtained without intravenous contrast. Angiographic images of the head were obtained using MRA technique without contrast. COMPARISON:  CT angio head and neck 07/04/2020 FINDINGS: MRI HEAD FINDINGS Brain: Acute infarct right MCA territory. This involves much of the right basal ganglia as well as the right frontal and temporal lobe. There is hemorrhagic transformation with mild-to-moderate blood in the infarct. There is local mass-effect and midline shift of 6 mm. No hydrocephalus. No evidence of chronic ischemia. No mass lesion. Vascular: Normal arterial flow voids at the base of brain. Skull and upper cervical spine: No focal skeletal abnormality. Sinuses/Orbits: Moderate mucosal edema paranasal sinuses. Bilateral mastoid effusion normal orbit Other: None MRA HEAD FINDINGS Both vertebral arteries are normal. Left PICA patent. Large right AICA patent. Basilar patent. Superior cerebellar and posterior cerebral arteries normal bilaterally. Internal carotid artery is widely patent bilaterally without stenosis or filling defect. Anterior and middle cerebral arteries widely patent bilaterally without stenosis or occlusion. No aneurysm. 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 Hemorrhagic transformation with mild to moderate hemorrhage in the infarct. There is mass-effect and 6 mm midline shift to left Negative MRA head Electronically Signed   By: Marlan Palau M.D.   On: 07/05/2020 10:01   IR CT Head Ltd  INDICATION: New onset of left-sided weakness, right gaze deviation and slurred speech.  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  EXAM: 1. EMERGENT LARGE VESSEL OCCLUSION THROMBOLYSIS (anterior CIRCULATION)  COMPARISON:  CT angiogram of the  head and neck of July 04, 2020.  MEDICATIONS: Ancef 2 g IV antibiotic was  administered within 1 hour of the procedure.  ANESTHESIA/SEDATION: General anesthesia  CONTRAST:  Isovue 300 105 mL.  FLUOROSCOPY TIME:  Fluoroscopy Time: 24 minutes 0 seconds (1208 mGy).  COMPLICATIONS: None immediate.  TECHNIQUE: Two physician emergency consent was obtained in view of the non availability of patient's family or next of kin physically or via phone.  The patient was then put under general anesthesia by the Department of Anesthesiology at [facility].  The right groin groin was prepped and draped in the usual sterile fashion. Thereafter using modified Seldinger technique, transfemoral access into the right common femoral artery was obtained without difficulty. Over a 0.035 inch guidewire an 8 French 25 cm Pinnacle sheath was inserted. Through this, and also over a 0.035 inch guidewire a 5 [place] JB 1 catheter was advanced to the aortic arch region and selectively positioned in the left common carotid artery, the right vertebral artery and the right common carotid artery.  FINDINGS: The left common carotid arteriogram demonstrates the left external carotid artery and its major branches to be widely patent.  The left internal carotid artery at the bulb to the cranial skull base is widely patent.  The petrous, the cavernous and the supraclinoid segments are widely patent.  There is an approximately 3 mm outpouching in the left posterior communicating artery region which may represent an infundibulum versus an aneurysm.  The left middle cerebral artery and the left anterior cerebral artery opacify into the capillary and venous phases. Prompt cross-filling via the anterior communicating artery of the right anterior cerebral A2 segment and distally, and the right anterior cerebral A1 segment and the distal A2 region is noted.  The right common carotid arteriogram demonstrates the right external carotid artery to be occluded. There is a meniscal filling defect right at the origin of the  right external carotid artery. Minimal extrusion was seen into the right internal carotid artery at the bulb.  More distally the right internal carotid artery is seen to opacify to the cranial skull base. Patency is seen of the petrous and the caval and proximal cavernous segments. 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  The right vertebral artery origin is widely patent.  The vessel is seen to opacify to the cranial skull base. More distally patency is seen of the right vertebrobasilar junction and the right posterior-inferior cerebellar artery.  The basilar artery, the posterior cerebral arteries, the superior cerebellar arteries and the anterior-inferior cerebellar arteries demonstrate wide patency into the capillary and venous phases. 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  PROCEDURE: The diagnostic JB 1 catheter in right common carotid artery was then exchanged over a 0.035 inch 300 cm Rosen exchange guidewire for an 087 balloon guide catheter which was positioned in the mid cervical right ICA.  The guidewire was removed. Good aspiration obtained from the hub of the balloon guide catheter. A control arteriogram performed through this demonstrated no evidence of spasms, dissections or of intraluminal filling defects. Over a 0.014 inch standard Synchro micro guidewire with a J configuration the combination of an 071 136 cm Zoom aspiration catheter inside of which was an 021 162 cm Trevo ProVue microcatheter was advanced to the level of the ipsilateral ophthalmic artery.  Thereafter, the micro guidewire was gently manipulated without difficulty through the occluded right internal carotid  artery supraclinoid segment into the right middle cerebral artery and into the M2 M3 segment of the inferior division of the right middle cerebral artery. This was followed by  advancement of the microcatheter. The guidewire was removed. Good aspiration obtained from the hub of the microcatheter. A gentle control arteriogram performed through this demonstrated antegrade flow into the distal M3 M4 segment of the inferior division of the right middle cerebral artery. This was then connected to continuous heparinized saline infusion.  A 5 mm x 37 mm Embotrap retrieval device was then advanced to the distal end of the microcatheter and deployed in the usual manner. The Zoom aspiration catheter was advanced such that it was in the distal occluded right middle cerebral artery. With constant aspiration being applied at the hub of the Zoom aspiration catheter with a Penumbra aspiration device, and with a 20 mL syringe at the hub of the balloon guide, and proximal flow arrest over 2 minutes, the combination of the retrieval device, microcatheter, and the Zoom aspiration catheter were retrieved and removed. Following reversal of flow arrest, a control arteriogram performed through the balloon guide in the right internal carotid artery demonstrated complete revascularization of the occluded right internal carotid artery supraclinoid segment, the right middle cerebral artery superior and inferior anterior temporal branches. However, noted were occlusions of the M3 region of the superior division in its two perisylvian branches, and also of the inferior division in the distal M3 segment.  Spasm in the right internal carotid artery was treated with 2 aliquots of 25 mcg of nitroglycerin with relief of the vasospasm.  Control arteriogram performed through the balloon guide in the right internal carotid artery demonstrated continued patency of the superior and the inferior divisions with no change in the occlusion of the 2 anterior perisylvian branches of the superior division, and also of the inferior division of the M3 region which over a period of time demonstrated improved revascularization into the  M4 regions.  A TICI 2b revascularization was achieved.  Given the gradual revascularization of the inferior division, no further endovascular treatment was attempted.  The balloon guide was removed. An 8 French Angio-Seal closure device applied successfully at the right groin puncture site. Distal pulses continued to be Dopplerable in both feet unchanged.  An intermediate CT of the brain demonstrated contrast stain +/-petechial hemorrhages in the posterior putamen on the right extending to the caudate head.  No mass effect or midline shift was noted.  The patient was then transferred to neuro ICU intubated for post revascularization care.  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  Persistent distal occlusion of the superior division anterior perisylvian branches, with gradual improvement revascularization of the dominant inferior division in the M3 M4 regions.  PLAN: Follow-up as per referring neurologist.   Electronically Signed   By: Julieanne Cotton M.D.   On: 07/05/2020 14:22  IR CT Head Ltd  INDICATION: New onset of left-sided weakness, right gaze deviation and slurred speech. 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 EXAM: 1. EMERGENT LARGE VESSEL OCCLUSION THROMBOLYSIS (anterior CIRCULATION) COMPARISON:  CT angiogram of the head and neck of July 04, 2020. MEDICATIONS: Ancef 2 g IV antibiotic was administered within 1 hour of the procedure. ANESTHESIA/SEDATION: General anesthesia CONTRAST:  Isovue 300 105  mL. FLUOROSCOPY TIME:  Fluoroscopy Time: 24 minutes 0 seconds (1208 mGy). COMPLICATIONS: None immediate. TECHNIQUE: Two physician emergency consent was obtained in view of the non availability of  patient's family or next of kin physically or via phone. The patient was then put under general anesthesia by the Department of Anesthesiology at [facility]. The right groin groin was prepped and draped in the usual sterile fashion. Thereafter using modified Seldinger technique, transfemoral access into the right common femoral artery was obtained without difficulty. Over a 0.035 inch guidewire an 8 French 25 cm Pinnacle sheath was inserted. Through this, and also over a 0.035 inch guidewire a 5 [place] JB 1 catheter was advanced to the aortic arch region and selectively positioned in the left common carotid artery, the right vertebral artery and the right common carotid artery. FINDINGS: The left common carotid arteriogram demonstrates the left external carotid artery and its major branches to be widely patent. The left internal carotid artery at the bulb to the cranial skull base is widely patent. The petrous, the cavernous and the supraclinoid segments are widely patent. There is an approximately 3 mm outpouching in the left posterior communicating artery region which may represent an infundibulum versus an aneurysm. The left middle cerebral artery and the left anterior cerebral artery opacify into the capillary and venous phases. Prompt cross-filling via the anterior communicating artery of the right anterior cerebral A2 segment and distally, and the right anterior cerebral A1 segment and the distal A2 region is noted. The right common carotid arteriogram demonstrates the right external carotid artery to be occluded. There is a meniscal filling defect right at the origin of the right external carotid artery. Minimal extrusion was seen into the right internal carotid artery at the bulb. More distally the right internal carotid artery is seen to opacify to the cranial skull base. Patency is seen of the petrous and the caval and proximal cavernous segments. Complete angiographic occlusion is seen of the  distal cavernous, and supraclinoid right ICA. The right vertebral artery origin is widely patent. The vessel is seen to opacify to the cranial skull base. More distally patency is seen of the right vertebrobasilar junction and the right posterior-inferior cerebellar artery. The basilar artery, the posterior cerebral arteries, the superior cerebellar arteries and the anterior-inferior cerebellar arteries demonstrate wide patency into the capillary and venous phases. 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 PROCEDURE: The diagnostic JB 1 catheter in right common carotid artery was then exchanged over a 0.035 inch 300 cm Rosen exchange guidewire for an 087 balloon guide catheter which was positioned in the mid cervical right ICA. The guidewire was removed. Good aspiration obtained from the hub of the balloon guide catheter. A control arteriogram performed through this demonstrated no evidence of spasms, dissections or of intraluminal filling defects. Over a 0.014 inch standard Synchro micro guidewire with a J configuration the combination of an 071 136 cm Zoom aspiration catheter inside of which was an 021 162 cm Trevo ProVue microcatheter was advanced to the level of the ipsilateral ophthalmic artery. Thereafter, the micro guidewire was gently manipulated without difficulty through the occluded right internal carotid artery supraclinoid segment into the right middle cerebral artery and into the M2 M3 segment of the inferior division of the right middle cerebral artery. This was followed by advancement of the microcatheter. The guidewire was removed. Good aspiration obtained from the hub of the microcatheter. A gentle control arteriogram  performed through this demonstrated antegrade flow into the distal M3 M4 segment of the inferior division of the right middle cerebral artery. This was then connected to continuous heparinized  saline infusion. A 5 mm x 37 mm Embotrap retrieval device was then advanced to the distal end of the microcatheter and deployed in the usual manner. The Zoom aspiration catheter was advanced such that it was in the distal occluded right middle cerebral artery. With constant aspiration being applied at the hub of the Zoom aspiration catheter with a Penumbra aspiration device, and with a 20 mL syringe at the hub of the balloon guide, and proximal flow arrest over 2 minutes, the combination of the retrieval device, microcatheter, and the Zoom aspiration catheter were retrieved and removed. Following reversal of flow arrest, a control arteriogram performed through the balloon guide in the right internal carotid artery demonstrated complete revascularization of the occluded right internal carotid artery supraclinoid segment, the right middle cerebral artery superior and inferior anterior temporal branches. However, noted were occlusions of the M3 region of the superior division in its two perisylvian branches, and also of the inferior division in the distal M3 segment. Spasm in the right internal carotid artery was treated with 2 aliquots of 25 mcg of nitroglycerin with relief of the vasospasm. Control arteriogram performed through the balloon guide in the right internal carotid artery demonstrated continued patency of the superior and the inferior divisions with no change in the occlusion of the 2 anterior perisylvian branches of the superior division, and also of the inferior division of the M3 region which over a period of time demonstrated improved revascularization into the M4 regions. A TICI 2b revascularization was achieved. Given the gradual revascularization of the inferior division, no further endovascular treatment was attempted. The balloon guide was removed. An 8 French Angio-Seal closure device applied successfully at the right groin puncture site. Distal pulses continued to be Dopplerable in both feet  unchanged. An intermediate CT of the brain demonstrated contrast stain +/-petechial hemorrhages in the posterior putamen on the right extending to the caudate head. No mass effect or midline shift was noted. The patient was then transferred to neuro ICU intubated for post revascularization care. 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 Persistent distal occlusion of the superior division anterior perisylvian branches, with gradual improvement revascularization of the dominant inferior division in the M3 M4 regions. PLAN: Follow-up as per referring neurologist. Electronically Signed   By: Julieanne Cotton M.D.   On: 07/05/2020 14:22   CT CEREBRAL PERFUSION W CONTRAST  Result Date: 07/04/2020 CLINICAL DATA:  Left-sided weakness and slurred speech EXAM: CT HEAD WITHOUT CONTRAST CT ANGIOGRAPHY HEAD AND NECK CT PERFUSION BRAIN TECHNIQUE: Multi detector CT imaging of the head was performed using the standard protocol without intravenous contrast. Multidetector CT imaging of the head and neck was performed using the standard protocol during bolus administration of intravenous contrast. Multiplanar CT image reconstructions and MIPs were obtained to evaluate the vascular anatomy. Carotid stenosis measurements (when applicable) are obtained utilizing NASCET criteria, using the distal internal carotid diameter as the denominator. Multiphase CT imaging of the brain was performed following IV bolus contrast injection. Subsequent parametric perfusion maps were calculated using RAPID software. CONTRAST:  OMNIPAQUE IOHEXOL 350 MG/ML SOLN COMPARISON:  None. FINDINGS: CT HEAD FINDINGS Brain: There is no acute intracranial hemorrhage or mass effect. Loss of gray-white differentiation involving  a portion of the right lentiform nucleus. Ventricles and  sulci are normal in size and configuration. No extra-axial collection. Vascular: No hyperdense vessel. Skull: Unremarkable. Sinuses/Orbits: Lobular maxillary sinus mucosal thickening. Orbits are unremarkable. Other: Minor patchy left mastoid tip opacification. ASPECTS [facility] Stroke Program Early CT Score) - Ganglionic level infarction (caudate, lentiform nuclei, internal capsule, insula, M1-M3 cortex): 6 - Supraganglionic infarction (M4-M6 cortex): 3 Total score (0-10 with 10 being normal): 9 CTA NECK FINDINGS Aortic arch: Normal caliber aorta.  Great vessel origins are patent. Right carotid system: Common carotid is patent. There is thrombus at the bifurcation extending into the external carotid. Less than 50% stenosis at the ICA origin. The cervical ICA is relatively smaller in caliber. Left carotid system: Patent.  No stenosis. Vertebral arteries: Patent. Right vertebral artery slightly dominant. No stenosis. Skeleton: Unremarkable. Other neck: Thyroid not well evaluated due to streak artifact. Likely reactive cervical lymph nodes. Upper chest: Included lungs are clear. Endotracheal tube is present. Review of the MIP images confirms the above findings CTA HEAD FINDINGS Anterior circulation: Proximal intracranial internal carotid artery is patent. There is loss of enhancement at the level of the supraclinoid portion. Faint enhancement is seen along the right M1 MCA. There is reconstitution of M2 branches. Patent right A1 ACA. Anterior communicating artery is present. Left anterior and middle cerebral arteries are patent. Posterior circulation: Intracranial vertebral arteries, basilar artery, and posterior cerebral arteries are patent. Venous sinuses: As permitted by contrast timing, patent. Review of the MIP images confirms the above findings CT Brain Perfusion Findings: CBF (<30%) Volume: 51mL Perfusion (Tmax>6.0s) volume: Mismatch Volume: 63mL Infarction Location: Right MCA territory IMPRESSION: No acute  intracranial hemorrhage. 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 Reconstitution of right MCA at the bifurcation. Right A1 ACA is patent. Anterior communicating artery is present. No posterior communicating artery is identified. Perfusion imaging demonstrates core infarction of 51 mL. This is greater than anticipated by noncontrast CT. Penumbra is 63 mL and therefore may be greater than calculated. Initial results were called by telephone at the time of interpretation on 07/04/2020 at 1:58 pm to provider Dr. Iver Nestle, who verbally acknowledged these results. Electronically Signed   By: Guadlupe Spanish M.D.   On: 07/04/2020 14:10   DG CHEST PORT 1 VIEW  Result Date: 07/27/2020 CLINICAL DATA:  Fever EXAM: PORTABLE CHEST 1 VIEW COMPARISON:  Portable exam 1250 hours compared to 07/18/2020 FINDINGS: Normal heart size, mediastinal contours, and pulmonary vascularity. Lungs clear. No pleural effusion or pneumothorax. Bones unremarkable. IMPRESSION: No acute abnormalities. Electronically Signed   By: Ulyses Southward M.D.   On: 07/27/2020 14:28   DG CHEST PORT 1 VIEW  Result Date: 07/18/2020 CLINICAL DATA:  Feeding tube dysfunction. EXAM: PORTABLE CHEST 1 VIEW COMPARISON:  07/16/2020 FINDINGS: Single view of the chest demonstrates a feeding tube that extends into the abdomen. The tube extends beyond the image. Both lungs are clear. Heart size is normal. Negative for a pneumothorax. IMPRESSION: 1. No acute chest abnormality. 2. Feeding tube extends into the abdomen but the tip is beyond the image. Electronically Signed   By: Richarda Overlie M.D.   On: 07/18/2020 10:52   DG CHEST PORT 1 VIEW  Result Date: 07/16/2020 CLINICAL DATA:  Eval for aspiration EXAM: PORTABLE CHEST 1  VIEW COMPARISON:  July 13, 2020 FINDINGS: The heart size and mediastinal contours are within normal limits. Slight interval improvement in the left perihilar airspace opacities. The right lung is clear. No pleural effusion. Enteric tube is seen below the diaphragm. IMPRESSION: Slight interval improvement in the left perihilar airspace opacities Electronically Signed   By: Jonna Clark M.D.   On: 07/16/2020 18:45   DG Chest Port 1 View  Result Date: 07/06/2020 CLINICAL DATA:  Post central line placement. EXAM: PORTABLE CHEST 1 VIEW COMPARISON:  Chest radiograph July 04, 2020. FINDINGS: Interval placement of a left upper extremity PICC with tip overlying the superior cavoatrial junction. Interval advancement of the endotracheal tube with tip now overlying the distal thoracic trachea 1.6 cm from the carina. Enteric tube coursing below the diaphragm with tip obscured by collimation. The heart size and mediastinal contours are unchanged. No focal consolidation. No visible pneumothorax. The visualized skeletal structures are unremarkable. IMPRESSION: 1. Interval placement of a left upper extremity PICC with tip overlying the superior cavoatrial junction. No visible pneumothorax. 2. Interval advancement of the endotracheal tube with tip now overlying the distal thoracic trachea. Electronically Signed   By: Maudry Mayhew MD   On: 07/06/2020 19:12   DG Abd Portable 1V  Result Date: 07/18/2020 CLINICAL DATA:  Feeding tube dysfunction, nurse reports choking EXAM: PORTABLE ABDOMEN - 1 VIEW COMPARISON:  None. FINDINGS: Two partial images of the abdomen are submitted for review, one of two demonstrating non weighted enteric feeding tube tip projecting over the gastric fundus. A substantial portion of the right hemiabdomen is excluded from images submitted for review. Nonobstructive pattern of bowel gas. IMPRESSION: Two partial images of the abdomen are submitted for review, one of two demonstrating non weighted enteric  feeding tube tip projecting over the gastric fundus. Electronically Signed   By: Lauralyn Primes M.D.   On: 07/18/2020 10:52   DG Abd Portable 1 View  Result Date: 07/04/2020 CLINICAL DATA:  OG tube placement. EXAM: PORTABLE ABDOMEN - 1 VIEW COMPARISON:  No prior. FINDINGS: OG tube noted with tip over the stomach. No gastric or bowel distention noted. Contrast noted both kidneys. Degenerative change lumbar spine. Surgical clips right upper quadrant. IMPRESSION: OG tube noted with tip over the stomach. No gastric or bowel distention noted. Electronically Signed   By: Maisie Fus  Register   On: 07/04/2020 15:13   DG Swallowing Func-Speech Pathology  Result Date: 07/31/2020 Objective Swallowing Evaluation: Type of Study: MBS-Modified Barium Swallow Study  Patient Details Name: Brandi Myers MRN: [id number] Date of Birth: 1985-12-20 Today's Date: 07/31/2020 Time: SLP Start Time (ACUTE ONLY): 0930 -SLP Stop Time (ACUTE ONLY): 1000 SLP Time Calculation (min) (ACUTE ONLY): 30 min Past Medical History: Past Medical History: Diagnosis Date . Diabetes mellitus without complication (HCC)  . Hypertension  Past Surgical History: Past Surgical History: Procedure Laterality Date . BUBBLE STUDY  07/19/2020  Procedure: BUBBLE STUDY;  Surgeon: Little Ishikawa, MD;  Location: [facility] ENDOSCOPY;  Service: Cardiovascular;; . CESAREAN SECTION   . CHOLECYSTECTOMY   . CRANIOTOMY Right 07/06/2020  Procedure: RIGHT CRANIECTOMY WITH PLACEMENT OF BONE FLAP IN ABDOMEN;  Surgeon: Maeola Harman, MD;  Location: [facility] OR;  Service: Neurosurgery;  Laterality: Right; . ESOPHAGOGASTRODUODENOSCOPY (EGD) WITH PROPOFOL N/A 07/26/2020  Procedure: ESOPHAGOGASTRODUODENOSCOPY (EGD) WITH PROPOFOL;  Surgeon: Violeta Gelinas, MD;  Location: [facility] ENDOSCOPY;  Service: General;  Laterality: N/A; . IR ANGIO INTRA EXTRACRAN SEL COM CAROTID INNOMINATE UNI L MOD SED  07/04/2020 . IR ANGIO VERTEBRAL SEL  VERTEBRAL UNI R MOD SED  07/04/2020 . IR CT HEAD LTD  07/04/2020 . IR CT  HEAD LTD  07/04/2020 . IR PERCUTANEOUS ART THROMBECTOMY/INFUSION INTRACRANIAL INC DIAG ANGIO  07/04/2020 . PEG PLACEMENT N/A 07/26/2020  Procedure: PERCUTANEOUS ENDOSCOPIC GASTROSTOMY (PEG) PLACEMENT;  Surgeon: Violeta Gelinas, MD;  Location: [facility] ENDOSCOPY;  Service: General;  Laterality: N/A; . RADIOLOGY WITH ANESTHESIA N/A 07/04/2020  Procedure: IR WITH ANESTHESIA;  Surgeon: Radiologist, Medication, MD;  Location: [facility] OR;  Service: Radiology;  Laterality: N/A; . TEE WITHOUT CARDIOVERSION N/A 07/19/2020  Procedure: TRANSESOPHAGEAL ECHOCARDIOGRAM (TEE);  Surgeon: Little Ishikawa, MD;  Location: [facility] ENDOSCOPY;  Service: Cardiovascular;  Laterality: N/A; . THROMBECTOMY BRACHIAL ARTERY Right 07/05/2020  Procedure: RIGHT UPPER EXTREMITY THROMBECTOMY;  Surgeon: Cephus Shelling, MD;  Location[facility] OR;  Service: Vascular;  Laterality: Right; HPI: 35 yo female admitted with L sided weakness, given TPA for R MCA involving basal ganglia, frontal, temporal lobes  3/23 s/p BIL revascularization  R MCA ACA achieving 2B revascularization. Pt intubated 3/23-31. 3/24 R UE thrombectomy. CT revealed worsening midline shift. 3/25 decompressive hemicranitectomy, R side bone flap in abdomen. PMH current smoker uncontrolled DM HTn  Subjective: -- (adequately awake) Assessment / Plan / Recommendation CHL IP CLINICAL IMPRESSIONS 07/31/2020 Clinical Impression Pt demonstrates some improvement in swallowing, primarily in airway protection, though severe cognitive impairment and oral dyspahgia persist. Pt is restless and distractible int he MBS suite (improved attention to PO in room yesterday) and needs max verbal and visual cues to attend to PO. All trials were given with a straw, which seems to be her preferred method of sipping. Given fleeting attention she does not always immediately form, propel and swallow bolus; she looses attention intermittently and there is slow delayed movement. She aspirated sips of nectar with sensation before  the swallow, but tolerated honey, puree and regular solids without difficulty and adequate strength throughout. Recommend pt initaite a dys 3 mech soft diet with honey thick liquids. Will f/u for tolerance. SLP Visit Diagnosis Dysphagia, oropharyngeal phase (R13.12) Attention and concentration deficit following -- Frontal lobe and executive function deficit following -- Impact on safety and function Moderate aspiration risk   CHL IP TREATMENT RECOMMENDATION 07/31/2020 Treatment Recommendations Therapy as outlined in treatment plan below   Prognosis 07/20/2020 Prognosis for Safe Diet Advancement Good Barriers to Reach Goals Severity of deficits Barriers/Prognosis Comment -- CHL IP DIET RECOMMENDATION 07/31/2020 SLP Diet Recommendations NPO;Dysphagia 3 (Mech soft) solids;Honey thick liquids Liquid Administration via -- Medication Administration -- Compensations Slow rate;Small sips/bites;Minimize environmental distractions Postural Changes Seated upright at 90 degrees   CHL IP OTHER RECOMMENDATIONS 07/31/2020 Recommended Consults -- Oral Care Recommendations Oral care BID Other Recommendations Order thickener from pharmacy;Have oral suction available   CHL IP FOLLOW UP RECOMMENDATIONS 07/31/2020 Follow up Recommendations Skilled Nursing facility   [facility] IP FREQUENCY AND DURATION 07/31/2020 Speech Therapy Frequency (ACUTE ONLY) min 2x/week Treatment Duration 2 weeks      CHL IP ORAL PHASE 07/31/2020 Oral Phase Impaired Oral - Pudding Teaspoon -- Oral - Pudding Cup -- Oral - Honey Teaspoon -- Oral - Honey Cup Decreased bolus cohesion;Delayed oral transit;Premature spillage;Lingual/palatal residue Oral - Nectar Teaspoon -- Oral - Nectar Cup Decreased bolus cohesion;Delayed oral transit;Premature spillage;Lingual/palatal residue Oral - Nectar Straw Decreased bolus cohesion;Delayed oral transit;Premature spillage;Lingual/palatal residue Oral - Thin Teaspoon -- Oral - Thin Cup -- Oral - Thin Straw -- Oral - Puree Decreased bolus  cohesion;Delayed oral transit;Premature spillage;Lingual/palatal residue Oral - Mech Soft -- Oral - Regular  Decreased bolus cohesion;Delayed oral transit;Premature spillage;Lingual/palatal residue Oral - Multi-Consistency -- Oral - Pill -- Oral Phase - Comment --  CHL IP PHARYNGEAL PHASE 07/31/2020 Pharyngeal Phase Impaired Pharyngeal- Pudding Teaspoon -- Pharyngeal -- Pharyngeal- Pudding Cup -- Pharyngeal -- Pharyngeal- Honey Teaspoon -- Pharyngeal -- Pharyngeal- Honey Cup Delayed swallow initiation-pyriform sinuses;Penetration/Aspiration before swallow Pharyngeal Material enters airway, remains ABOVE vocal cords then ejected out;Material does not enter airway Pharyngeal- Nectar Teaspoon -- Pharyngeal -- Pharyngeal- Nectar Cup Penetration/Aspiration before swallow;Delayed swallow initiation-pyriform sinuses;Trace aspiration Pharyngeal Material enters airway, passes BELOW cords and not ejected out despite cough attempt by patient;Material enters airway, passes BELOW cords then ejected out;Material does not enter airway Pharyngeal- Nectar Straw -- Pharyngeal -- Pharyngeal- Thin Teaspoon -- Pharyngeal -- Pharyngeal- Thin Cup -- Pharyngeal -- Pharyngeal- Thin Straw -- Pharyngeal -- Pharyngeal- Puree Delayed swallow initiation-pyriform sinuses Pharyngeal -- Pharyngeal- Mechanical Soft -- Pharyngeal -- Pharyngeal- Regular -- Pharyngeal -- Pharyngeal- Multi-consistency -- Pharyngeal -- Pharyngeal- Pill -- Pharyngeal -- Pharyngeal Comment --  CHL IP CERVICAL ESOPHAGEAL PHASE 07/20/2020 Cervical Esophageal Phase WFL Pudding Teaspoon -- Pudding Cup -- Honey Teaspoon -- Honey Cup -- Nectar Teaspoon -- Nectar Cup -- Nectar Straw -- Thin Teaspoon -- Thin Cup -- Thin Straw -- Puree -- Mechanical Soft -- Regular -- Multi-consistency -- Pill -- Cervical Esophageal Comment -- Harlon Ditty, MA CCC-SLP Acute Rehabilitation Services Pager [phone] Office [phone] Claudine Mouton 07/31/2020, 10:47 AM              DG  Swallowing Func-Speech Pathology  Result Date: 07/20/2020 Objective Swallowing Evaluation: Type of Study: MBS-Modified Barium Swallow Study  Patient Details Name: Brandi Myers MRN: [id number] Date of Birth: 1985/08/25 Today's Date: 07/20/2020 Time: SLP Start Time (ACUTE ONLY): 6578 -SLP Stop Time (ACUTE ONLY): 0856 SLP Time Calculation (min) (ACUTE ONLY): 18 min Past Medical History: Past Medical History: Diagnosis Date . Diabetes mellitus without complication (HCC)  . Hypertension  Past Surgical History: Past Surgical History: Procedure Laterality Date . CESAREAN SECTION   . CHOLECYSTECTOMY   . CRANIOTOMY Right 07/06/2020  Procedure: RIGHT CRANIECTOMY WITH PLACEMENT OF BONE FLAP IN ABDOMEN;  Surgeon: Maeola Harman, MD;  Location: [facility] OR;  Service: Neurosurgery;  Laterality: Right; . IR ANGIO INTRA EXTRACRAN SEL COM CAROTID INNOMINATE UNI L MOD SED  07/04/2020 . IR ANGIO VERTEBRAL SEL VERTEBRAL UNI R MOD SED  07/04/2020 . IR CT HEAD LTD  07/04/2020 . IR CT HEAD LTD  07/04/2020 . IR PERCUTANEOUS ART THROMBECTOMY/INFUSION INTRACRANIAL INC DIAG ANGIO  07/04/2020 . RADIOLOGY WITH ANESTHESIA N/A 07/04/2020  Procedure: IR WITH ANESTHESIA;  Surgeon: Radiologist, Medication, MD;  Location: [facility] OR;  Service: Radiology;  Laterality: N/A; . THROMBECTOMY BRACHIAL ARTERY Right 07/05/2020  Procedure: RIGHT UPPER EXTREMITY THROMBECTOMY;  Surgeon: Cephus Shelling, MD;  Location: [facility] OR;  Service: Vascular;  Laterality: Right; HPI: 35 yo female admitted with L sided weakness, given TPA for R MCA involving basal ganglia, frontal, temporal lobes  3/23 s/p BIL revascularization  R MCA ACA achieving 2B revascularization. Pt intubated 3/23-31. 3/24 R UE thrombectomy. CT revealed worsening midline shift. 3/25 decompressive hemicranitectomy, R side bone flap in abdomen. PMH current smoker uncontrolled DM HTn  Subjective: -- (adequately awake) Assessment / Plan / Recommendation CHL IP CLINICAL IMPRESSIONS 07/20/2020 Clinical Impression Although  Ayomide was adequately alert for MBS, protective measures for airway closure were late allowing barium to enter vestibule without exiting. Nectar and honey thick aggregated in pyriform sinuses for 6-9 seconds and fell onto vocal cords during  the swallow. There was sensation as evidenced by strong cough with incomplete expulsion. Elizbeth's cognitive impairments, primarily decreased ability to attend to bolus transfer exacerbated timing and coordination. Of note, pt's closure and coordination were improved with two trials/swallows avoiding penetration. Viscocity of pudding allowed laryngeal closure to occur prior to proluplsion of bolus into hypopharynx and did not enter vstibule. Compliance of strategy cannot be relied upon at this point and recommend re-placement of alternative feeding tube. Therapist intervention will target trials of pudding and increase attention, transit and swallow initiation and hopefully can avoid longer term means of nutrition however, uncertain at this time. SLP Visit Diagnosis Dysphagia, oropharyngeal phase (R13.12) Attention and concentration deficit following -- Frontal lobe and executive function deficit following -- Impact on safety and function Severe aspiration risk   CHL IP TREATMENT RECOMMENDATION 07/20/2020 Treatment Recommendations Therapy as outlined in treatment plan below   Prognosis 07/20/2020 Prognosis for Safe Diet Advancement Good Barriers to Reach Goals Severity of deficits Barriers/Prognosis Comment -- CHL IP DIET RECOMMENDATION 07/20/2020 SLP Diet Recommendations NPO Liquid Administration via -- Medication Administration Via alternative means Compensations -- Postural Changes --   CHL IP OTHER RECOMMENDATIONS 07/20/2020 Recommended Consults -- Oral Care Recommendations Oral care QID Other Recommendations --   CHL IP FOLLOW UP RECOMMENDATIONS 07/20/2020 Follow up Recommendations Inpatient Rehab   CHL IP FREQUENCY AND DURATION 07/20/2020 Speech Therapy Frequency (ACUTE ONLY) min  2x/week Treatment Duration 2 weeks      CHL IP ORAL PHASE 07/20/2020 Oral Phase Impaired Oral - Pudding Teaspoon -- Oral - Pudding Cup -- Oral - Honey Teaspoon -- Oral - Honey Cup Weak lingual manipulation;Reduced posterior propulsion;Delayed oral transit;Decreased bolus cohesion Oral - Nectar Teaspoon -- Oral - Nectar Cup Delayed oral transit;Weak lingual manipulation;Reduced posterior propulsion Oral - Nectar Straw -- Oral - Thin Teaspoon -- Oral - Thin Cup -- Oral - Thin Straw -- Oral - Puree Delayed oral transit Oral - Mech Soft -- Oral - Regular -- Oral - Multi-Consistency -- Oral - Pill -- Oral Phase - Comment --  CHL IP PHARYNGEAL PHASE 07/20/2020 Pharyngeal Phase Impaired Pharyngeal- Pudding Teaspoon -- Pharyngeal -- Pharyngeal- Pudding Cup -- Pharyngeal -- Pharyngeal- Honey Teaspoon -- Pharyngeal -- Pharyngeal- Honey Cup Delayed swallow initiation-pyriform sinuses;Penetration/Aspiration before swallow Pharyngeal Material enters airway, CONTACTS cords and not ejected out Pharyngeal- Nectar Teaspoon -- Pharyngeal -- Pharyngeal- Nectar Cup Delayed swallow initiation-pyriform sinuses;Penetration/Aspiration during swallow Pharyngeal -- Pharyngeal- Nectar Straw -- Pharyngeal -- Pharyngeal- Thin Teaspoon -- Pharyngeal -- Pharyngeal- Thin Cup -- Pharyngeal -- Pharyngeal- Thin Straw -- Pharyngeal -- Pharyngeal- Puree Delayed swallow initiation-vallecula Pharyngeal -- Pharyngeal- Mechanical Soft -- Pharyngeal -- Pharyngeal- Regular -- Pharyngeal -- Pharyngeal- Multi-consistency -- Pharyngeal -- Pharyngeal- Pill -- Pharyngeal -- Pharyngeal Comment --  CHL IP CERVICAL ESOPHAGEAL PHASE 07/20/2020 Cervical Esophageal Phase WFL Pudding Teaspoon -- Pudding Cup -- Honey Teaspoon -- Honey Cup -- Nectar Teaspoon -- Nectar Cup -- Nectar Straw -- Thin Teaspoon -- Thin Cup -- Thin Straw -- Puree -- Mechanical Soft -- Regular -- Multi-consistency -- Pill -- Cervical Esophageal Comment -- Royce Macadamia 07/20/2020, 11:36 AM  Breck Coons [employer] administrator Pager [phone] Office [phone]             VAS [place] TRANSCRANIAL DOPPLER W BUBBLES  Result Date: 07/10/2020  Transcranial Doppler with Bubble Indications: Subarachnoid hemorrhage. Performing Technologist: Blanch Media RVS  Examination Guidelines: A complete evaluation includes B-mode imaging, spectral Doppler, color Doppler, and power Doppler as needed of all accessible  portions of each vessel. Bilateral testing is considered an integral part of a complete examination. Limited examinations for reoccurring indications may be performed as noted.  Summary: No HITS at rest or during Valsalva. Negative transcranial Doppler Bubble study with no evidence of right to left intracardiac communication.  A vascular evaluation was performed. The left middle cerebral artery was studied. An IV was inserted into the patient's left forearm . Verbal informed consent was obtained.  Negative TCD Bubble study *See table(s) above for TCD measurements and observations.  Diagnosing physician: Delia Heady MD Electronically signed by Delia Heady MD on 07/10/2020 at 2:21:29 PM.    Final    VAS [place] UPPER EXTREMITY ARTERIAL DUPLEX  Result Date: 07/15/2020 UPPER EXTREMITY DUPLEX STUDY Indications: Patient complains of Cool, purple fingers, wound on right palm,              recent right upper extremity thrombectomy. History:     Patient has a history of upper extremity pain.  Limitations: Patient confusion, constant movement of right arm. Comparison Study: Prior study done 07/05/20 Performing Technologist: Sherren Kerns RVS  Examination Guidelines: A complete evaluation includes B-mode imaging, spectral Doppler, color Doppler, and power Doppler as needed of all accessible portions of each vessel. Bilateral testing is considered an integral part of a complete examination. Limited examinations for reoccurring indications may be performed as noted.  Right Doppler Findings:  +---------------+----------+-------------------+--------+------------------+ Site           PSV (cm/s)Waveform           StenosisComments           +---------------+----------+-------------------+--------+------------------+ Subclavian Prox52        triphasic                                     +---------------+----------+-------------------+--------+------------------+ Subclavian Mid -49       triphasic                                     +---------------+----------+-------------------+--------+------------------+ Subclavian Dist          triphasic                                     +---------------+----------+-------------------+--------+------------------+ Axillary       48        triphasic                                     +---------------+----------+-------------------+--------+------------------+ Brachial Prox  37        triphasic                                     +---------------+----------+-------------------+--------+------------------+ Brachial Mid   31        triphasic                                     +---------------+----------+-------------------+--------+------------------+ Brachial Dist  -29       triphasic                                     +---------------+----------+-------------------+--------+------------------+  Radial Mid     60        triphasic                                     +---------------+----------+-------------------+--------+------------------+ Radial Dist    53        triphasic                                     +---------------+----------+-------------------+--------+------------------+ Ulnar Mid                dampened monophasic        partially occluded +---------------+----------+-------------------+--------+------------------+ Palmar Arch              dampened monophasic        minimal flow noted +---------------+----------+-------------------+--------+------------------+    Summary:  Right:  Right ulnar artery appears occluded. *See table(s) above for measurements and observations. Electronically signed by Sherald Hess MD on 07/15/2020 at 4:21:03 PM.    Final    VAS [place] UPPER EXTREMITY ARTERIAL DUPLEX  Result Date: 07/06/2020 UPPER EXTREMITY DUPLEX STUDY Indications: Patient complains of RUE - cool to touch.  Risk Factors: Prior CVA. Performing Technologist: Ernestene Mention  Examination Guidelines: A complete evaluation includes B-mode imaging, spectral Doppler, color Doppler, and power Doppler as needed of all accessible portions of each vessel. Bilateral testing is considered an integral part of a complete examination. Limited examinations for reoccurring indications may be performed as noted.  Right Doppler Findings: +---------------+----------+----------+--------+--------+ Site           PSV (cm/s)Waveform  StenosisComments +---------------+----------+----------+--------+--------+ Subclavian Prox81        triphasic                  +---------------+----------+----------+--------+--------+ Subclavian Dist          absent                     +---------------+----------+----------+--------+--------+ Axillary       63        triphasic                  +---------------+----------+----------+--------+--------+ Brachial Prox  51        biphasic                   +---------------+----------+----------+--------+--------+ Brachial Mid   33        biphasic                   +---------------+----------+----------+--------+--------+ Brachial Dist  14        monophasicoccluded         +---------------+----------+----------+--------+--------+ Radial Prox    0         absent    occluded         +---------------+----------+----------+--------+--------+ Radial Mid     0         absent    occluded         +---------------+----------+----------+--------+--------+ Radial Dist    0         absent    occluded          +---------------+----------+----------+--------+--------+ Ulnar Prox     0         absent    occluded         +---------------+----------+----------+--------+--------+ Ulnar Mid      0  absent                     +---------------+----------+----------+--------+--------+ Ulnar Dist     0         absent                     +---------------+----------+----------+--------+--------+ Incidental finding: RUE cephalic SVT.   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 Vascular consult recommended. Electronically signed by Heath Lark on 07/06/2020 at 7:04:01 PM.    Final    ECHOCARDIOGRAM COMPLETE  Result Date: 07/05/2020    ECHOCARDIOGRAM REPORT   Patient Name:   ALIANY FIORENZA [place] Date of Exam: 07/05/2020 Medical Rec #:  [id number]       Height:       65.0 in Accession #:    [id number]      Weight:       159.2 lb Date of Birth:  10-22-1985      BSA:          1.795 m Patient Age:    34 years        BP:           130/91 mmHg Patient Gender: F               HR:           115 bpm. Exam Location:  Inpatient Procedure: 2D Echo, Cardiac Doppler and Color Doppler Indications:    Stroke 434.91  History:        Patient has no prior history of Echocardiogram examinations.                 Risk Factors:Hypertension and Diabetes.  Sonographer:    Elmarie Shiley Dance Referring Phys: [id number] ERIC LINDZEN IMPRESSIONS  1. Left ventricular ejection fraction, by estimation, is 60 to 65%. The left ventricle has normal function. The left ventricle has no regional wall motion abnormalities. Left ventricular diastolic parameters are consistent with Grade I diastolic dysfunction (impaired relaxation).  2. Right ventricular systolic function is normal. The right ventricular size is normal.  3. The mitral valve is normal in structure. Trivial mitral valve regurgitation. No evidence of mitral stenosis.  4. The aortic  valve is normal in structure. Aortic valve regurgitation is not visualized. No aortic stenosis is present.  5. The inferior vena cava is normal in size with greater than 50% respiratory variability, suggesting right atrial pressure of 3 mmHg. Conclusion(s)/Recommendation(s): No intracardiac source of embolism detected on this transthoracic study. A transesophageal echocardiogram is recommended to exclude cardiac source of embolism if clinically indicated. FINDINGS  Left Ventricle: Left ventricular ejection fraction, by estimation, is 60 to 65%. The left ventricle has normal function. The left ventricle has no regional wall motion abnormalities. The left ventricular internal cavity size was normal in size. There is  no left ventricular hypertrophy. Left ventricular diastolic parameters are consistent with Grade I diastolic dysfunction (impaired relaxation). Normal left ventricular filling pressure. Right Ventricle: The right ventricular size is normal. No increase in right ventricular wall thickness. Right ventricular systolic function is normal. Left Atrium: Left atrial size was normal in size. Right Atrium: Right atrial size was normal in size. Pericardium: There is no evidence of pericardial effusion. Mitral Valve: The mitral valve is normal in structure. Trivial mitral valve regurgitation. No evidence of mitral valve stenosis. Tricuspid Valve: The  tricuspid valve is normal in structure. Tricuspid valve regurgitation is trivial. No evidence of tricuspid stenosis. Aortic Valve: The aortic valve is normal in structure. Aortic valve regurgitation is not visualized. No aortic stenosis is present. Pulmonic Valve: The pulmonic valve was normal in structure. Pulmonic valve regurgitation is not visualized. No evidence of pulmonic stenosis. Aorta: The aortic root is normal in size and structure. Venous: The inferior vena cava is normal in size with greater than 50% respiratory variability, suggesting right atrial pressure  of 3 mmHg. IAS/Shunts: No atrial level shunt detected by color flow Doppler.  LEFT VENTRICLE PLAX 2D LVIDd:         4.20 cm  Diastology LVIDs:         3.20 cm  LV e' medial:    10.30 cm/s LV PW:         0.90 cm  LV E/e' medial:  8.5 LV IVS:        0.80 cm  LV e' lateral:   15.00 cm/s LVOT diam:     2.10 cm  LV E/e' lateral: 5.9 LV SV:         55 LV SV Index:   31 LVOT Area:     3.46 cm  RIGHT VENTRICLE             IVC RV Basal diam:  2.40 cm     IVC diam: 1.40 cm RV S prime:     13.40 cm/s TAPSE (M-mode): 2.2 cm LEFT ATRIUM             Index       RIGHT ATRIUM          Index LA diam:        2.70 cm 1.50 cm/m  RA Area:     8.48 cm LA Vol (A2C):   27.5 ml 15.32 ml/m RA Volume:   17.50 ml 9.75 ml/m LA Vol (A4C):   18.7 ml 10.42 ml/m LA Biplane Vol: 23.2 ml 12.92 ml/m  AORTIC VALVE LVOT Vmax:   99.20 cm/s LVOT Vmean:  66.100 cm/s LVOT VTI:    0.160 m  AORTA Ao Root diam: 3.20 cm Ao Asc diam:  2.60 cm MITRAL VALVE MV Area (PHT): 2.97 cm    SHUNTS MV Decel Time: 256 msec    Systemic VTI:  0.16 m MV E velocity: 88.05 cm/s  Systemic Diam: 2.10 cm Tobias Alexander MD Electronically signed by Tobias Alexander MD Signature Date/Time: 07/05/2020/12:43:53 PM    Final    ECHO TEE  Result Date: 07/19/2020    TRANSESOPHOGEAL ECHO REPORT   Patient Name:   Brandi Myers Date of Exam: 07/19/2020 Medical Rec #:  [id number]       Height:       65.0 in Accession #:    [id number]      Weight:       159.8 lb Date of Birth:  1985-06-14      BSA:          1.798 m Patient Age:    34 years        BP:           134/87 mmHg Patient Gender: F               HR:           101 bpm. Exam Location:  Inpatient Procedure: Transesophageal Echo, 3D Echo, Cardiac Doppler, Color Doppler and  Saline Contrast Bubble Study Indications:    Stroke  History:        Patient has prior history of Echocardiogram examinations, most                 recent 07/05/2020. Risk Factors:Hypertension and Diabetes.  Sonographer:    Eulah Pont RDCS Referring  Phys: [id number] Little Ishikawa PROCEDURE: After discussion of the risks and benefits of a TEE, an informed consent was obtained. The transesophogeal probe was passed without difficulty through the esophogus of the patient. Sedation performed by different physician. The patient was monitored while under deep sedation. Anesthestetic sedation was provided intravenously by Anesthesiology: 206.25mg  of Propofol, 80mg  of Lidocaine. The patient's vital signs; including heart rate, blood pressure, and oxygen saturation; remained stable throughout the procedure. The patient developed no complications during the procedure. IMPRESSIONS  1. Left ventricular ejection fraction, by estimation, is 50 to 55%. The left ventricle has low normal function.  2. Right ventricular systolic function is normal. The right ventricular size is normal.  3. No left atrial/left atrial appendage thrombus was detected.  4. The mitral valve is normal in structure. No evidence of mitral valve regurgitation.  5. The aortic valve is tricuspid. Aortic valve regurgitation is trivial. No aortic stenosis is present.  6. Agitated saline contrast bubble study was negative, with no evidence of any interatrial shunt. FINDINGS  Left Ventricle: Left ventricular ejection fraction, by estimation, is 50 to 55%. The left ventricle has low normal function. The left ventricular internal cavity size was normal in size. Right Ventricle: The right ventricular size is normal. No increase in right ventricular wall thickness. Right ventricular systolic function is normal. Left Atrium: Left atrial size was normal in size. No left atrial/left atrial appendage thrombus was detected. Right Atrium: Right atrial size was normal in size. Pericardium: There is no evidence of pericardial effusion. Mitral Valve: The mitral valve is normal in structure. No evidence of mitral valve regurgitation. Tricuspid Valve: The tricuspid valve is normal in structure. Tricuspid valve  regurgitation is trivial. Aortic Valve: The aortic valve is tricuspid. Aortic valve regurgitation is trivial. No aortic stenosis is present. Pulmonic Valve: The pulmonic valve was grossly normal. Pulmonic valve regurgitation is not visualized. Aorta: The aortic root is normal in size and structure. IAS/Shunts: No atrial level shunt detected by color flow Doppler. Agitated saline contrast was given intravenously to evaluate for intracardiac shunting. Agitated saline contrast bubble study was negative, with no evidence of any interatrial shunt. MD Electronically signed by MD Signature Date/Time: 07/19/2020/5:22:36 PM    Final    IR PERCUTANEOUS ART THROMBECTOMY/INFUSION INTRACRANIAL INC DIAG ANGIO  Result Date: 07/06/2020 INDICATION: New onset of left-sided weakness, right gaze deviation and slurred speech. 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 EXAM: 1. EMERGENT LARGE VESSEL OCCLUSION THROMBOLYSIS (anterior CIRCULATION) COMPARISON:  CT angiogram of the head and neck of July 04, 2020. MEDICATIONS: Ancef 2 g IV antibiotic was administered within 1 hour of the procedure. ANESTHESIA/SEDATION: General anesthesia CONTRAST:  Isovue 300 105 mL. FLUOROSCOPY TIME:  Fluoroscopy Time: 24 minutes 0 seconds (1208 mGy). COMPLICATIONS: None immediate. TECHNIQUE: Two physician emergency consent was obtained in view of the non availability of patient's family or next of kin physically or via phone. The patient was then put under general anesthesia by the Department of Anesthesiology at [facility]. The right  groin groin was prepped and draped in the usual sterile fashion. Thereafter using modified Seldinger technique, transfemoral access into the right common femoral artery was obtained without difficulty. Over a 0.035 inch guidewire an 8 French 25 cm  Pinnacle sheath was inserted. Through this, and also over a 0.035 inch guidewire a 5 [place] JB 1 catheter was advanced to the aortic arch region and selectively positioned in the left common carotid artery, the right vertebral artery and the right common carotid artery. FINDINGS: The left common carotid arteriogram demonstrates the left external carotid artery and its major branches to be widely patent. The left internal carotid artery at the bulb to the cranial skull base is widely patent. The petrous, the cavernous and the supraclinoid segments are widely patent. There is an approximately 3 mm outpouching in the left posterior communicating artery region which may represent an infundibulum versus an aneurysm. The left middle cerebral artery and the left anterior cerebral artery opacify into the capillary and venous phases. Prompt cross-filling via the anterior communicating artery of the right anterior cerebral A2 segment and distally, and the right anterior cerebral A1 segment and the distal A2 region is noted. The right common carotid arteriogram demonstrates the right external carotid artery to be occluded. There is a meniscal filling defect right at the origin of the right external carotid artery. Minimal extrusion was seen into the right internal carotid artery at the bulb. More distally the right internal carotid artery is seen to opacify to the cranial skull base. Patency is seen of the petrous and the caval and proximal cavernous segments. 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 The right vertebral artery origin is widely patent. The vessel is seen to opacify to the cranial skull base. More distally patency is seen of the right vertebrobasilar junction and the right posterior-inferior cerebellar artery. The basilar artery, the posterior cerebral arteries, the superior cerebellar arteries and the anterior-inferior cerebellar arteries demonstrate wide  patency into the capillary and venous phases. 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 PROCEDURE: The diagnostic JB 1 catheter in right common carotid artery was then exchanged over a 0.035 inch 300 cm Rosen exchange guidewire for an 087 balloon guide catheter which was positioned in the mid cervical right ICA. The guidewire was removed. Good aspiration obtained from the hub of the balloon guide catheter. A control arteriogram performed through this demonstrated no evidence of spasms, dissections or of intraluminal filling defects. Over a 0.014 inch standard Synchro micro guidewire with a J configuration the combination of an 071 136 cm Zoom aspiration catheter inside of which was an 021 162 cm Trevo ProVue microcatheter was advanced to the level of the ipsilateral ophthalmic artery. Thereafter, the micro guidewire was gently manipulated without difficulty through the occluded right internal carotid artery supraclinoid segment into the right middle cerebral artery and into the M2 M3 segment of the inferior division of the right middle cerebral artery. This was followed by advancement of the microcatheter. The guidewire was removed. Good aspiration obtained from the hub of the microcatheter. A gentle control arteriogram performed through this demonstrated antegrade flow into the distal M3 M4 segment of the inferior division of the right middle cerebral artery. This was then connected to continuous heparinized saline infusion. A 5 mm x 37 mm Embotrap retrieval device was then advanced to the distal end of the microcatheter and deployed in the usual manner. The Zoom  aspiration catheter was advanced such that it was in the distal occluded right middle cerebral artery. With constant aspiration being applied at the hub of the Zoom aspiration catheter with a Penumbra aspiration device, and with a 20 mL syringe at the hub of the  balloon guide, and proximal flow arrest over 2 minutes, the combination of the retrieval device, microcatheter, and the Zoom aspiration catheter were retrieved and removed. Following reversal of flow arrest, a control arteriogram performed through the balloon guide in the right internal carotid artery demonstrated complete revascularization of the occluded right internal carotid artery supraclinoid segment, the right middle cerebral artery superior and inferior anterior temporal branches. However, noted were occlusions of the M3 region of the superior division in its two perisylvian branches, and also of the inferior division in the distal M3 segment. Spasm in the right internal carotid artery was treated with 2 aliquots of 25 mcg of nitroglycerin with relief of the vasospasm. Control arteriogram performed through the balloon guide in the right internal carotid artery demonstrated continued patency of the superior and the inferior divisions with no change in the occlusion of the 2 anterior perisylvian branches of the superior division, and also of the inferior division of the M3 region which over a period of time demonstrated improved revascularization into the M4 regions. A TICI 2b revascularization was achieved. Given the gradual revascularization of the inferior division, no further endovascular treatment was attempted. The balloon guide was removed. An 8 French Angio-Seal closure device applied successfully at the right groin puncture site. Distal pulses continued to be Dopplerable in both feet unchanged. An intermediate CT of the brain demonstrated contrast stain +/-petechial hemorrhages in the posterior putamen on the right extending to the caudate head. No mass effect or midline shift was noted. The patient was then transferred to neuro ICU intubated for post revascularization care. IMPRESSION: Endovascular revascularization of occluded right internal carotid artery supraclinoid segment, the right middle  cerebral artery and the right anterior cerebral artery achieving a 2b revascularization. Persistent distal occlusion of the superior division anterior perisylvian branches, with gradual improvement revascularization of the dominant inferior division in the M3 M4 regions. PLAN: Follow-up as per referring neurologist. Electronically Signed   By: Julieanne Cotton M.D.   On: 07/05/2020 14:22   CT HEAD CODE STROKE WO CONTRAST  Result Date: 07/04/2020 CLINICAL DATA:  Left-sided weakness and slurred speech EXAM: CT HEAD WITHOUT CONTRAST CT ANGIOGRAPHY HEAD AND NECK CT PERFUSION BRAIN TECHNIQUE: Multi detector CT imaging of the head was performed using the standard protocol without intravenous contrast. Multidetector CT imaging of the head and neck was performed using the standard protocol during bolus administration of intravenous contrast. Multiplanar CT image reconstructions and MIPs were obtained to evaluate the vascular anatomy. Carotid stenosis measurements (when applicable) are obtained utilizing NASCET criteria, using the distal internal carotid diameter as the denominator. Multiphase CT imaging of the brain was performed following IV bolus contrast injection. Subsequent parametric perfusion maps were calculated using RAPID software. CONTRAST:  OMNIPAQUE IOHEXOL 350 MG/ML SOLN COMPARISON:  None. FINDINGS: CT HEAD FINDINGS Brain: There is no acute intracranial hemorrhage or mass effect. Loss of gray-white differentiation involving a portion of the right lentiform nucleus. Ventricles and sulci are normal in size and configuration. No extra-axial collection. Vascular: No hyperdense vessel. Skull: Unremarkable. Sinuses/Orbits: Lobular maxillary sinus mucosal thickening. Orbits are unremarkable. Other: Minor patchy left mastoid tip opacification. ASPECTS [facility] Stroke Program Early CT Score) - Ganglionic level infarction (caudate, lentiform nuclei, internal capsule,  insula, M1-M3 cortex): 6 -  Supraganglionic infarction (M4-M6 cortex): 3 Total score (0-10 with 10 being normal): 9 CTA NECK FINDINGS Aortic arch: Normal caliber aorta.  Great vessel origins are patent. Right carotid system: Common carotid is patent. There is thrombus at the bifurcation extending into the external carotid. Less than 50% stenosis at the ICA origin. The cervical ICA is relatively smaller in caliber. Left carotid system: Patent.  No stenosis. Vertebral arteries: Patent. Right vertebral artery slightly dominant. No stenosis. Skeleton: Unremarkable. Other neck: Thyroid not well evaluated due to streak artifact. Likely reactive cervical lymph nodes. Upper chest: Included lungs are clear. Endotracheal tube is present. Review of the MIP images confirms the above findings CTA HEAD FINDINGS Anterior circulation: Proximal intracranial internal carotid artery is patent. There is loss of enhancement at the level of the supraclinoid portion. Faint enhancement is seen along the right M1 MCA. There is reconstitution of M2 branches. Patent right A1 ACA. Anterior communicating artery is present. Left anterior and middle cerebral arteries are patent. Posterior circulation: Intracranial vertebral arteries, basilar artery, and posterior cerebral arteries are patent. Venous sinuses: As permitted by contrast timing, patent. Review of the MIP images confirms the above findings CT Brain Perfusion Findings: CBF (<30%) Volume: 51mL Perfusion (Tmax>6.0s) volume: Mismatch Volume: 63mL Infarction Location: Right MCA territory IMPRESSION: No acute intracranial hemorrhage. 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 Reconstitution of right MCA at the bifurcation. Right A1 ACA is patent. Anterior  communicating artery is present. No posterior communicating artery is identified. Perfusion imaging demonstrates core infarction of 51 mL. This is greater than anticipated by noncontrast CT. Penumbra is 63 mL and therefore may be greater than calculated. Initial results were called by telephone at the time of interpretation on 07/04/2020 at 1:58 pm to provider Dr. Iver Nestle, who verbally acknowledged these results. Electronically Signed   By: Guadlupe Spanish M.D.   On: 07/04/2020 14:10   VAS [place] LOWER EXTREMITY VENOUS (DVT)  Result Date: 07/15/2020  Lower Venous DVT Study Indications: Tachycardia, tachypnea. Stroke.  Comparison Study: Prior negative BLEV done 07/05/20 Performing Technologist: Sherren Kerns RVS  Examination Guidelines: A complete evaluation includes B-mode imaging, spectral Doppler, color Doppler, and power Doppler as needed of all accessible portions of each vessel. Bilateral testing is considered an integral part of a complete examination. Limited examinations for reoccurring indications may be performed as noted. The reflux portion of the exam is performed with the patient in reverse Trendelenburg.  +---------+---------------+---------+-----------+----------+--------------+ RIGHT    CompressibilityPhasicitySpontaneityPropertiesThrombus Aging +---------+---------------+---------+-----------+----------+--------------+ CFV      Full           Yes      Yes                                 +---------+---------------+---------+-----------+----------+--------------+ SFJ      Full                                                        +---------+---------------+---------+-----------+----------+--------------+ FV Prox  Full                                                        +---------+---------------+---------+-----------+----------+--------------+  FV Mid   Full                                                         +---------+---------------+---------+-----------+----------+--------------+ FV DistalFull                                                        +---------+---------------+---------+-----------+----------+--------------+ PFV      Full                                                        +---------+---------------+---------+-----------+----------+--------------+ POP      Full           Yes      Yes                                 +---------+---------------+---------+-----------+----------+--------------+ PTV      Full                                                        +---------+---------------+---------+-----------+----------+--------------+ PERO     Full                                                        +---------+---------------+---------+-----------+----------+--------------+   +---------+---------------+---------+-----------+----------+--------------+ LEFT     CompressibilityPhasicitySpontaneityPropertiesThrombus Aging +---------+---------------+---------+-----------+----------+--------------+ CFV      Full           Yes      Yes                                 +---------+---------------+---------+-----------+----------+--------------+ SFJ      Full                                                        +---------+---------------+---------+-----------+----------+--------------+ FV Prox  Full                                                        +---------+---------------+---------+-----------+----------+--------------+ FV Mid   Full                                                        +---------+---------------+---------+-----------+----------+--------------+  FV DistalFull                                                        +---------+---------------+---------+-----------+----------+--------------+ PFV      Full                                                         +---------+---------------+---------+-----------+----------+--------------+ POP      Full           Yes      Yes                                 +---------+---------------+---------+-----------+----------+--------------+ PTV      Full                                                        +---------+---------------+---------+-----------+----------+--------------+ PERO     Full                                                        +---------+---------------+---------+-----------+----------+--------------+     Summary: BILATERAL: - No evidence of deep vein thrombosis seen in the lower extremities, bilaterally. -   *See table(s) above for measurements and observations. Electronically signed by Sherald Hess MD on 07/15/2020 at 4:20:07 PM.    Final    VAS [place] LOWER EXTREMITY VENOUS (DVT)  Result Date: 07/05/2020  Lower Venous DVT Study Indications: Embolic stroke.  Comparison Study: No prior Performing Technologist: Marilynne Halsted RDMS, RVT  Examination Guidelines: A complete evaluation includes B-mode imaging, spectral Doppler, color Doppler, and power Doppler as needed of all accessible portions of each vessel. Bilateral testing is considered an integral part of a complete examination. Limited examinations for reoccurring indications may be performed as noted. The reflux portion of the exam is performed with the patient in reverse Trendelenburg.  +---------+---------------+---------+-----------+----------+--------------+ RIGHT    CompressibilityPhasicitySpontaneityPropertiesThrombus Aging +---------+---------------+---------+-----------+----------+--------------+ CFV      Full           Yes      Yes                                 +---------+---------------+---------+-----------+----------+--------------+ SFJ      Full                                                        +---------+---------------+---------+-----------+----------+--------------+ FV Prox  Full                                                         +---------+---------------+---------+-----------+----------+--------------+  FV Mid   Full                                                        +---------+---------------+---------+-----------+----------+--------------+ FV DistalFull                                                        +---------+---------------+---------+-----------+----------+--------------+ PFV      Full                                                        +---------+---------------+---------+-----------+----------+--------------+ POP      Full           Yes      Yes                                 +---------+---------------+---------+-----------+----------+--------------+ PTV      Full                                                        +---------+---------------+---------+-----------+----------+--------------+ PERO     Full                                                        +---------+---------------+---------+-----------+----------+--------------+   +---------+---------------+---------+-----------+----------+--------------+ LEFT     CompressibilityPhasicitySpontaneityPropertiesThrombus Aging +---------+---------------+---------+-----------+----------+--------------+ CFV      Full           Yes      Yes                                 +---------+---------------+---------+-----------+----------+--------------+ SFJ      Full                                                        +---------+---------------+---------+-----------+----------+--------------+ FV Prox  Full                                                        +---------+---------------+---------+-----------+----------+--------------+ FV Mid   Full                                                        +---------+---------------+---------+-----------+----------+--------------+  FV DistalFull                                                         +---------+---------------+---------+-----------+----------+--------------+ PFV      Full                                                        +---------+---------------+---------+-----------+----------+--------------+ POP      Full           Yes      Yes                                 +---------+---------------+---------+-----------+----------+--------------+ PTV      Full                                                        +---------+---------------+---------+-----------+----------+--------------+ PERO     Full                                                        +---------+---------------+---------+-----------+----------+--------------+     Summary: BILATERAL: - No evidence of deep vein thrombosis seen in the lower extremities, bilaterally. -No evidence of popliteal cyst, bilaterally.   *See table(s) above for measurements and observations. Electronically signed by Sherald Hess MD on 07/05/2020 at 6:21:17 PM.    Final    [place] EKG SITE RITE  Result Date: 07/06/2020 If Site Rite image not attached, placement could not be confirmed due to current cardiac rhythm.  IR ANGIO INTRA EXTRACRAN SEL COM CAROTID INNOMINATE UNI L MOD SED  INDICATION: New onset of left-sided weakness, right gaze deviation and slurred speech.  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  EXAM: 1. EMERGENT LARGE VESSEL OCCLUSION THROMBOLYSIS (anterior CIRCULATION)  COMPARISON:  CT angiogram of the head and neck of July 04, 2020.  MEDICATIONS: Ancef 2 g IV antibiotic was administered within 1 hour of the procedure.  ANESTHESIA/SEDATION: General anesthesia  CONTRAST:  Isovue 300 105 mL.  FLUOROSCOPY TIME:  Fluoroscopy Time: 24 minutes 0 seconds (1208 mGy).  COMPLICATIONS: None immediate.  TECHNIQUE: Two physician emergency consent was obtained in view of the non  availability of patient's family or next of kin physically or via phone.  The patient was then put under general anesthesia by the Department of Anesthesiology at [facility].  The right groin groin was prepped and draped in the usual sterile fashion. Thereafter using modified Seldinger technique, transfemoral access into the right common femoral artery was obtained without difficulty. Over a 0.035 inch guidewire an 8 French 25 cm Pinnacle sheath was inserted. Through this, and also over a 0.035 inch guidewire a 5  [place] JB 1 catheter was advanced to the aortic arch region and selectively positioned in the left common carotid artery, the right vertebral artery and the right common carotid artery.  FINDINGS: The left common carotid arteriogram demonstrates the left external carotid artery and its major branches to be widely patent.  The left internal carotid artery at the bulb to the cranial skull base is widely patent.  The petrous, the cavernous and the supraclinoid segments are widely patent.  There is an approximately 3 mm outpouching in the left posterior communicating artery region which may represent an infundibulum versus an aneurysm.  The left middle cerebral artery and the left anterior cerebral artery opacify into the capillary and venous phases. Prompt cross-filling via the anterior communicating artery of the right anterior cerebral A2 segment and distally, and the right anterior cerebral A1 segment and the distal A2 region is noted.  The right common carotid arteriogram demonstrates the right external carotid artery to be occluded. There is a meniscal filling defect right at the origin of the right external carotid artery. Minimal extrusion was seen into the right internal carotid artery at the bulb.  More distally the right internal carotid artery is seen to opacify to the cranial skull base. Patency is seen of the petrous and the caval and proximal cavernous segments. 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  The right vertebral artery origin is widely patent.  The vessel is seen to opacify to the cranial skull base. More distally patency is seen of the right vertebrobasilar junction and the right posterior-inferior cerebellar artery.  The basilar artery, the posterior cerebral arteries, the superior cerebellar arteries and the anterior-inferior cerebellar arteries demonstrate wide patency into the capillary and venous phases. 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  PROCEDURE: The diagnostic JB 1 catheter in right common carotid artery was then exchanged over a 0.035 inch 300 cm Rosen exchange guidewire for an 087 balloon guide catheter which was positioned in the mid cervical right ICA.  The guidewire was removed. Good aspiration obtained from the hub of the balloon guide catheter. A control arteriogram performed through this demonstrated no evidence of spasms, dissections or of intraluminal filling defects. Over a 0.014 inch standard Synchro micro guidewire with a J configuration the combination of an 071 136 cm Zoom aspiration catheter inside of which was an 021 162 cm Trevo ProVue microcatheter was advanced to the level of the ipsilateral ophthalmic artery.  Thereafter, the micro guidewire was gently manipulated without difficulty through the occluded right internal carotid artery supraclinoid segment into the right middle cerebral artery and into the M2 M3 segment of the inferior division of the right middle cerebral artery. This was followed by advancement of the microcatheter. The guidewire was removed. Good aspiration obtained from the hub of the microcatheter. A gentle control arteriogram performed through this demonstrated antegrade flow into the distal M3 M4 segment of the inferior division of the right middle cerebral artery.  This was then connected to continuous heparinized saline infusion.  A 5 mm x 37 mm Embotrap retrieval device was then advanced to the distal end of the microcatheter and deployed in the usual manner. The Zoom aspiration catheter was advanced such that it was in the distal occluded right middle cerebral artery. With constant aspiration being applied at the hub of the Zoom aspiration catheter with a Penumbra aspiration device, and with a 20 mL  syringe at the hub of the balloon guide, and proximal flow arrest over 2 minutes, the combination of the retrieval device, microcatheter, and the Zoom aspiration catheter were retrieved and removed. Following reversal of flow arrest, a control arteriogram performed through the balloon guide in the right internal carotid artery demonstrated complete revascularization of the occluded right internal carotid artery supraclinoid segment, the right middle cerebral artery superior and inferior anterior temporal branches. However, noted were occlusions of the M3 region of the superior division in its two perisylvian branches, and also of the inferior division in the distal M3 segment.  Spasm in the right internal carotid artery was treated with 2 aliquots of 25 mcg of nitroglycerin with relief of the vasospasm.  Control arteriogram performed through the balloon guide in the right internal carotid artery demonstrated continued patency of the superior and the inferior divisions with no change in the occlusion of the 2 anterior perisylvian branches of the superior division, and also of the inferior division of the M3 region which over a period of time demonstrated improved revascularization into the M4 regions.  A TICI 2b revascularization was achieved.  Given the gradual revascularization of the inferior division, no further endovascular treatment was attempted.  The balloon guide was removed. An 8 French Angio-Seal closure device applied successfully at the right groin puncture  site. Distal pulses continued to be Dopplerable in both feet unchanged.  An intermediate CT of the brain demonstrated contrast stain +/-petechial hemorrhages in the posterior putamen on the right extending to the caudate head.  No mass effect or midline shift was noted.  The patient was then transferred to neuro ICU intubated for post revascularization care.  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  Persistent distal occlusion of the superior division anterior perisylvian branches, with gradual improvement revascularization of the dominant inferior division in the M3 M4 regions.  PLAN: Follow-up as per referring neurologist.   Electronically Signed   By: Julieanne Cotton M.D.   On: 07/05/2020 14:22  IR ANGIO VERTEBRAL SEL VERTEBRAL UNI R MOD SED  INDICATION: New onset of left-sided weakness, right gaze deviation and slurred speech.  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  EXAM: 1. EMERGENT LARGE VESSEL OCCLUSION THROMBOLYSIS (anterior CIRCULATION)  COMPARISON:  CT angiogram of the head and neck of July 04, 2020.  MEDICATIONS: Ancef 2 g IV antibiotic was administered within 1 hour of the procedure.  ANESTHESIA/SEDATION: General anesthesia  CONTRAST:  Isovue 300 105 mL.  FLUOROSCOPY TIME:  Fluoroscopy Time: 24 minutes 0 seconds (1208 mGy).  COMPLICATIONS: None immediate.  TECHNIQUE: Two physician emergency consent was obtained in view of the non availability of patient's family or next of kin physically or via phone.  The patient was then put under general anesthesia by the Department of Anesthesiology at [facility].  The right groin groin was prepped and draped in the usual sterile  fashion. Thereafter using modified Seldinger technique, transfemoral access into the right common femoral artery was obtained without difficulty. Over a 0.035 inch guidewire an 8 French 25 cm Pinnacle sheath was inserted. Through this, and also over a 0.035 inch guidewire a 5 [place] JB 1 catheter was advanced to the aortic arch region and selectively positioned in the left common carotid artery, the  right vertebral artery and the right common carotid artery.  FINDINGS: The left common carotid arteriogram demonstrates the left external carotid artery and its major branches to be widely patent.  The left internal carotid artery at the bulb to the cranial skull base is widely patent.  The petrous, the cavernous and the supraclinoid segments are widely patent.  There is an approximately 3 mm outpouching in the left posterior communicating artery region which may represent an infundibulum versus an aneurysm.  The left middle cerebral artery and the left anterior cerebral artery opacify into the capillary and venous phases. Prompt cross-filling via the anterior communicating artery of the right anterior cerebral A2 segment and distally, and the right anterior cerebral A1 segment and the distal A2 region is noted.  The right common carotid arteriogram demonstrates the right external carotid artery to be occluded. There is a meniscal filling defect right at the origin of the right external carotid artery. Minimal extrusion was seen into the right internal carotid artery at the bulb.  More distally the right internal carotid artery is seen to opacify to the cranial skull base. Patency is seen of the petrous and the caval and proximal cavernous segments. 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  The right vertebral artery origin is widely patent.  The vessel is seen to opacify to the cranial skull base. More distally patency is seen of the right vertebrobasilar  junction and the right posterior-inferior cerebellar artery.  The basilar artery, the posterior cerebral arteries, the superior cerebellar arteries and the anterior-inferior cerebellar arteries demonstrate wide patency into the capillary and venous phases. 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  PROCEDURE: The diagnostic JB 1 catheter in right common carotid artery was then exchanged over a 0.035 inch 300 cm Rosen exchange guidewire for an 087 balloon guide catheter which was positioned in the mid cervical right ICA.  The guidewire was removed. Good aspiration obtained from the hub of the balloon guide catheter. A control arteriogram performed through this demonstrated no evidence of spasms, dissections or of intraluminal filling defects. Over a 0.014 inch standard Synchro micro guidewire with a J configuration the combination of an 071 136 cm Zoom aspiration catheter inside of which was an 021 162 cm Trevo ProVue microcatheter was advanced to the level of the ipsilateral ophthalmic artery.  Thereafter, the micro guidewire was gently manipulated without difficulty through the occluded right internal carotid artery supraclinoid segment into the right middle cerebral artery and into the M2 M3 segment of the inferior division of the right middle cerebral artery. This was followed by advancement of the microcatheter. The guidewire was removed. Good aspiration obtained from the hub of the microcatheter. A gentle control arteriogram performed through this demonstrated antegrade flow into the distal M3 M4 segment of the inferior division of the right middle cerebral artery. This was then connected to continuous heparinized saline infusion.  A 5 mm x 37 mm Embotrap retrieval device was then advanced to the distal end of the microcatheter and deployed in the usual manner. The Zoom aspiration catheter was advanced such that it  was in the distal occluded right middle cerebral artery. With constant aspiration being applied at the hub of the Zoom aspiration catheter with a Penumbra aspiration device, and with a 20 mL syringe at the hub of the balloon guide, and proximal flow arrest over 2 minutes, the combination of the retrieval device,  microcatheter, and the Zoom aspiration catheter were retrieved and removed. Following reversal of flow arrest, a control arteriogram performed through the balloon guide in the right internal carotid artery demonstrated complete revascularization of the occluded right internal carotid artery supraclinoid segment, the right middle cerebral artery superior and inferior anterior temporal branches. However, noted were occlusions of the M3 region of the superior division in its two perisylvian branches, and also of the inferior division in the distal M3 segment.  Spasm in the right internal carotid artery was treated with 2 aliquots of 25 mcg of nitroglycerin with relief of the vasospasm.  Control arteriogram performed through the balloon guide in the right internal carotid artery demonstrated continued patency of the superior and the inferior divisions with no change in the occlusion of the 2 anterior perisylvian branches of the superior division, and also of the inferior division of the M3 region which over a period of time demonstrated improved revascularization into the M4 regions.  A TICI 2b revascularization was achieved.  Given the gradual revascularization of the inferior division, no further endovascular treatment was attempted.  The balloon guide was removed. An 8 French Angio-Seal closure device applied successfully at the right groin puncture site. Distal pulses continued to be Dopplerable in both feet unchanged.  An intermediate CT of the brain demonstrated contrast stain +/-petechial hemorrhages in the posterior putamen on the right extending to the caudate head.  No mass effect or midline  shift was noted.  The patient was then transferred to neuro ICU intubated for post revascularization care.  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  Persistent distal occlusion of the superior division anterior perisylvian branches, with gradual improvement revascularization of the dominant inferior division in the M3 M4 regions.  PLAN: Follow-up as per referring neurologist.   Electronically Signed   By: Julieanne Cotton M.D.   On: 07/05/2020 14:22   Microbiology: Recent Results (from the past 240 hour(s))  Culture, blood (Routine X 2) w Reflex to ID Panel     Status: None   Collection Time: 07/27/20  3:32 PM   Specimen: BLOOD  Result Value Ref Range Status   Specimen Description BLOOD SITE NOT SPECIFIED  Final   Special Requests   Final    BOTTLES DRAWN AEROBIC ONLY Blood Culture results may not be optimal due to an inadequate volume of blood received in culture bottles   Culture   Final    NO GROWTH 5 DAYS Performed at [facility], [address], [place], [place] [address]    Report Status 08/01/2020 FINAL  Final  Culture, blood (Routine X 2) w Reflex to ID Panel     Status: None   Collection Time: 07/27/20  3:36 PM   Specimen: BLOOD  Result Value Ref Range Status   Specimen Description BLOOD SITE NOT SPECIFIED  Final   Special Requests   Final    BOTTLES DRAWN AEROBIC ONLY Blood Culture results may not be optimal due to an inadequate volume of blood received in culture bottles   Culture   Final    NO GROWTH 5 DAYS Performed at [facility], [address], [place], [place] [address]    Report Status 08/01/2020 FINAL  Final  Culture, Urine     Status: None   Collection Time: 07/28/20  1:00 PM   Specimen: Urine, Random  Result Value  Ref Range Status   Specimen Description URINE, RANDOM   Final   Special Requests NONE  Final   Culture   Final    NO GROWTH Performed at [facility], [address], [place], [place] [address]    Report Status 07/29/2020 FINAL  Final     Labs: Basic Metabolic Panel: Recent Labs  Lab 07/28/20 0206 07/29/20 0159 07/30/20 0525 07/31/20 0423  NA 134* 133* 135 138  K 4.2 3.7 4.9 4.2  CL 94* 95* 95* 97*  CO2 25 28 30 30   GLUCOSE 151* 115* 113* 156*  BUN 10 9 11 12   CREATININE 0.55 0.51 0.59 0.52  CALCIUM 9.8 9.5 9.6 9.7   Liver Function Tests: No results for input(s): AST, ALT, ALKPHOS, BILITOT, PROT, ALBUMIN in the last 168 hours. No results for input(s): LIPASE, AMYLASE in the last 168 hours. No results for input(s): AMMONIA in the last 168 hours. CBC: Recent Labs  Lab 07/28/20 0206 07/29/20 0159 07/30/20 0525 07/31/20 0423  WBC 27.0* 21.2* 14.8* 11.0*  HGB 9.6* 9.2* 9.9* 9.9*  HCT 34.6* 32.0* 35.0* 34.8*  MCV 80.5 79.6* 81.4 80.4  PLT 519* 495* 458* 390   Cardiac Enzymes: No results for input(s): CKTOTAL, CKMB, CKMBINDEX, TROPONINI in the last 168 hours. BNP: BNP (last 3 results) No results for input(s): BNP in the last 8760 hours.  ProBNP (last 3 results) No results for input(s): PROBNP in the last 8760 hours.  CBG: Recent Labs  Lab 08/02/20 1639 08/02/20 1943 08/02/20 2327 08/03/20 0425 08/03/20 0816  GLUCAP 119* 154* 157* 98 140*       Signed:  Junious Silk ANP [facility] 08/03/2020, 10:18 AM

## 2020-08-03 NOTE — Progress Notes (Signed)
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 3/24 R UE thromectomy CT reveals worsening midline shift start 3% saline 3/25 decompressive hemicranitectomy R side bone flap in abdomen 3/25 fever 103. Extubated 3/31. PMH current smoker uncontrolled DM HTn   OT comments  Patient progressing slowly, eager to engage with therapy.  Seen for OT/PT session.  Continues to require max assist +2 for bed mobility, but improved sitting balance at EOB with moments of min assist when provided sustained attention to "touch R shoulder with therapist" or wedge, overall max assist for static sitting balance.  She requires mod assist for grooming tasks, although improving ability to follow 1 step commands (80%) and multi step commands (25%) pt highly focused on pain in R hand limiting functional use.  RN educated on splint use of L UE due to increasing flexor synergy tone, donned for this morning and educated RN for use at night typically (was not on last night).  Will continue to follow, updated goals today.    Follow Up Recommendations  SNF    Equipment Recommendations  None recommended by OT    Recommendations for Other Services      Precautions / Restrictions Precautions Precautions: Fall;Other (comment) Precaution Comments: bone flap out in R abdomen, L hemiparesis, PEG, helmet with activity Restrictions Weight Bearing Restrictions: No       Mobility Bed Mobility Overal bed mobility: Needs Assistance Bed Mobility: Supine to Sit;Sit to Supine;Rolling Rolling: Mod assist   Supine to sit: Max assist;+2 for physical assistance;+2 for safety/equipment Sit to supine: Max assist;+2  for physical assistance;+2 for safety/equipment   General bed mobility comments: mod assist rolling L with increased time to initiate. +2 max for sup <> sit, requiring assist for all aspects of mobility    Transfers                 General transfer comment: deferred today    Balance Overall balance assessment: Needs assistance Sitting-balance support: Feet supported;Single extremity supported Sitting balance-Leahy Scale: Poor Sitting balance - Comments: brief periods of min assist to maintain sitting balance, but required max assist for majority of time. Heavy L lateral and posterior lean. Postural control: Posterior lean;Left lateral lean                                 ADL either performed or assessed with clinical judgement   ADL Overall ADL's : Needs assistance/impaired Eating/Feeding: Set up;Supervision/ safety Eating/Feeding Details (indicate cue type and reason): setup to reach and grab honey thick drink, increased time and effort to locate in L visual field Grooming: Wash/dry face;Wash/dry hands;Moderate assistance;Sitting Grooming Details (indicate cue type and reason): sitting up in bed to engage in grooming, requires assist to wash hands and hand over hand to wash face--pt guarded due to R hand pain     Lower Body Bathing: Maximal assistance;+2 for safety/equipment;+2 for physical assistance;Bed level Lower Body Bathing Details (indicate cue type and reason): seated in bed (HOB elevated) to wash upper legs with max assist using R hand  Functional mobility during ADLs: Maximal assistance;+2 for physical assistance;+2 for safety/equipment General ADL Comments: remains limited by L hemi, balance, cognition     Vision   Vision Assessment?: Vision impaired- to be further tested in functional context Additional Comments: pt able to scan to L with mulitmodal cueing, poor ability to sustain attention to L.  Strong R gaze  preference.   Perception     Praxis      Cognition Arousal/Alertness: Awake/alert Behavior During Therapy: Impulsive;Restless Overall Cognitive Status: Impaired/Different from baseline Area of Impairment: Orientation;Attention;Memory;Following commands;Safety/judgement;Awareness;Problem solving                 Orientation Level: Disoriented to;Place Current Attention Level: Sustained Memory: Decreased short-term memory;Decreased recall of precautions Following Commands: Follows one step commands inconsistently;Follows one step commands with increased time;Follows multi-step commands inconsistently;Follows multi-step commands with increased time Safety/Judgement: Decreased awareness of deficits;Decreased awareness of safety Awareness: Intellectual Problem Solving: Slow processing;Decreased initiation;Difficulty sequencing;Requires verbal cues;Requires tactile cues General Comments: pt disoriented to place, reoriented and continues to tell thearpist she is at her apartment.  Patient able to follow 1 step commands with 80% accuracy, 2 step commands with 25% accuracy.  She continues to require mulitmodal cueing, R gaze preference. Poor awareness to situation, telling therapists she is [redacted] weeks pregnant.        Exercises Exercises: Other exercises Other Exercises Other Exercises: PROM to L UE, held in flexion synergy.  Donned resting hand splint as RN reports not on overnight.  Educated Charity fundraiser on wearing for a few hours today, then overnight tonight.   Shoulder Instructions       General Comments Multimodal cues to scan environment. Preferred R gaze.    Pertinent Vitals/ Pain       Pain Assessment: Faces Faces Pain Scale: Hurts even more Pain Location: R hand Pain Descriptors / Indicators: Guarding;Grimacing;Discomfort;Tender Pain Intervention(s): Limited activity within patient's tolerance;Monitored during session;Repositioned  Home Living                                           Prior Functioning/Environment              Frequency  Min 2X/week        Progress Toward Goals  OT Goals(current goals can now be found in the care plan section)  Progress towards OT goals: Progressing toward goals  Acute Rehab OT Goals Patient Stated Goal: home to her kids OT Goal Formulation: With patient  Plan Discharge plan remains appropriate;Frequency remains appropriate    Co-evaluation    PT/OT/SLP Co-Evaluation/Treatment: Yes Reason for Co-Treatment: Complexity of the patient's impairments (multi-system involvement);Necessary to address cognition/behavior during functional activity;To address functional/ADL transfers;For patient/therapist safety PT goals addressed during session: Mobility/safety with mobility;Balance OT goals addressed during session: ADL's and self-care      AM-PAC OT "6 Clicks" Daily Activity     Outcome Measure   Help from another person eating meals?: A Lot Help from another person taking care of personal grooming?: A Lot Help from another person toileting, which includes using toliet, bedpan, or urinal?: Total Help from another person bathing (including washing, rinsing, drying)?: Total Help from another person to put on and taking off regular upper body clothing?: Total Help from another person to put on and taking off regular lower body clothing?: Total 6 Click Score: 8    End of Session    OT  Visit Diagnosis: Unsteadiness on feet (R26.81);Other abnormalities of gait and mobility (R26.89);Muscle weakness (generalized) (M62.81);Hemiplegia and hemiparesis;Pain Hemiplegia - Right/Left: Left Hemiplegia - dominant/non-dominant: Non-Dominant Hemiplegia - caused by: Cerebral infarction Pain - Right/Left: Right Pain - part of body: Hand   Activity Tolerance Patient tolerated treatment well   Patient Left in bed;with call bell/phone within reach;with bed alarm set;with nursing/sitter in room;with restraints  reapplied   Nurse Communication Mobility status;Precautions (R hand, resting hand splint to L hand)        Time: 4680-3212 OT Time Calculation (min): 32 min  Charges: OT General Charges $OT Visit: 1 Visit OT Treatments $Self Care/Home Management : 8-22 mins  Barry Brunner, OT Acute Rehabilitation Services Pager [phone] Office [phone]    Chancy Milroy 08/03/2020, 11:37 AM

## 2020-08-03 NOTE — Progress Notes (Signed)
  Speech Language Pathology Treatment: Dysphagia  Patient Details Name: Brandi Myers MRN: [id number] DOB: 1985-07-08 Today's Date: 08/03/2020 Time: 1045-1100 SLP Time Calculation (min) (ACUTE ONLY): 15 min  Assessment / Plan / Recommendation Clinical Impression  Pt slid down in bed upon SLP arrival for skilled dysphagia treatment, calling for help. NT assisted with positioning pt upright in bed for safe PO intake, but soon after positioning decreased and pt again required repositioning. Once positioning was adequate for PO intake, pt consumed dysphagia 3, HTL snack with no overt s/sx of aspiration. Oral clearance of solids also noted. While attention was fleeting, pt was easily redirected to POs via consistent verbal cues. Should be noted that positioning continued to decrease throughout the session and eventually session was terminated for pt safety. Recommend pt continue on dysphagia 3, HTL diet with full supervision/assist during meals to provide small bites/sips at slow rate, ensure upright positioning during intake and minimize distractions to increase pt attention to PO intake and decrease overall risk for aspiration.    HPI HPI: 35 yo female admitted with L sided weakness, given TPA for R MCA involving basal ganglia, frontal, temporal lobes  3/23 s/p BIL revascularization  R MCA ACA achieving 2B revascularization. Pt intubated 3/23-31. 3/24 R UE thrombectomy. CT revealed worsening midline shift. 3/25 decompressive hemicranitectomy, R side bone flap in abdomen. PMH current smoker uncontrolled DM HTn      SLP Plan  Continue with current plan of care       Recommendations  Diet recommendations: Dysphagia 3 (mechanical soft);Honey-thick liquid Liquids provided via: Cup Supervision: Staff to assist with self feeding;Full supervision/cueing for compensatory strategies Compensations: Slow rate;Small sips/bites;Minimize environmental distractions Postural Changes and/or Swallow Maneuvers:  Seated upright 90 degrees                Oral Care Recommendations: Oral care BID Follow up Recommendations: Skilled Nursing facility SLP Visit Diagnosis: Dysphagia, oropharyngeal phase (R13.12) Plan: Continue with current plan of care       GO               Avie Echevaria, MA, CCC-SLP Acute Rehabilitation Services Office Number: [phone]  Paulette Blanch 08/03/2020, 11:15 AM

## 2020-08-03 NOTE — Progress Notes (Signed)
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 3/24 R UE thromectomy CT reveals worsening midline shift start 3% saline 3/25 decompressive hemicranitectomy R side bone flap in abdomen 3/25 fever 103. Extubated 3/31. PMH current smoker uncontrolled DM HTn    PT Comments    Pt received in bed, eager to sit up and participate in therapy. Session focused on trunk strength, sitting balance, and maintaining midline in sit. She required mod assist rolling, +2 max assist sup <> sit, and min to max assist to maintain sitting balance. Pt sat EOB x  10-12 minutes, then impulsively attempting to return to supine, stating "I just want to be comfortable." With multimodal cues, pt able to visual scan environment in all directions and reach across midline. Pt following simple commands 80% of time and multi step commands 25% of time. Sitting balance improved when pt instructed to touch her R shoulder to object placed on R side (I.e. therapist's shoulder, then wedge mat). Holding this position, pt briefly able to maintain sitting balance with just min assist. Pt supine in bed at end of session.  Pt very focused on R hand during session. Distracted by pain, but pt unable to discern why her hand was hurting. (ischemic RUE)   Follow Up Recommendations  SNF     Equipment Recommendations  Wheelchair (measurements PT);Wheelchair cushion (measurements PT);Hospital bed    Recommendations for Other Services       Precautions / Restrictions Precautions Precautions: Fall;Other (comment) Precaution Comments: bone flap out in R abdomen, L hemiparesis, PEG, helmet with activity    Mobility  Bed  Mobility Overal bed mobility: Needs Assistance Bed Mobility: Supine to Sit;Sit to Supine;Rolling Rolling: Mod assist   Supine to sit: Max assist;+2 for physical assistance;+2 for safety/equipment Sit to supine: Max assist;+2 for physical assistance;+2 for safety/equipment   General bed mobility comments: mod assist rolling L with increased time to initiate. +2 max for sup <> sit, requiring assist for all aspects of mobility    Transfers                    Ambulation/Gait                 Stairs             Wheelchair Mobility    Modified Rankin (Stroke Patients Only) Modified Rankin (Stroke Patients Only) Pre-Morbid Rankin Score: No symptoms Modified Rankin: Severe disability     Balance Overall balance assessment: Needs assistance Sitting-balance support: Feet supported;Single extremity supported Sitting balance-Leahy Scale: Poor Sitting balance - Comments: brief periods of min assist to maintain sitting balance, but required max assist for majority of time. Heavy L lateral and posterior lean. Postural control: Posterior lean;Left lateral lean                                  Cognition Arousal/Alertness: Awake/alert Behavior During Therapy: Impulsive;Restless Overall Cognitive Status: Impaired/Different from baseline Area of Impairment: Orientation;Attention;Memory;Following commands;Safety/judgement;Awareness;Problem solving                 Orientation Level: Disoriented to;Place;Time;Situation Current Attention Level: Sustained Memory: Decreased short-term  memory;Decreased recall of precautions Following Commands: Follows one step commands inconsistently;Follows one step commands with increased time;Follows multi-step commands inconsistently;Follows multi-step commands with increased time Safety/Judgement: Decreased awareness of deficits;Decreased awareness of safety Awareness: Intellectual Problem Solving: Slow  processing;Decreased initiation;Difficulty sequencing;Requires verbal cues;Requires tactile cues General Comments: Pt telling therapists she is [redacted] weeks pregnant.      Exercises      General Comments General comments (skin integrity, edema, etc.): Multimodal cues to scan environment. Preferred R gaze.      Pertinent Vitals/Pain Pain Assessment: Faces Faces Pain Scale: Hurts even more Pain Location: R hand Pain Descriptors / Indicators: Guarding;Grimacing;Discomfort;Tender Pain Intervention(s): Monitored during session    Home Living                      Prior Function            PT Goals (current goals can now be found in the care plan section) Acute Rehab PT Goals Patient Stated Goal: home to her kids Time For Goal Achievement: 08/14/20 Progress towards PT goals: Progressing toward goals    Frequency    Min 3X/week      PT Plan Current plan remains appropriate    Co-evaluation PT/OT/SLP Co-Evaluation/Treatment: Yes Reason for Co-Treatment: Complexity of the patient's impairments (multi-system involvement);For patient/therapist safety;Necessary to address cognition/behavior during functional activity;To address functional/ADL transfers PT goals addressed during session: Mobility/safety with mobility;Balance        AM-PAC PT "6 Clicks" Mobility   Outcome Measure  Help needed turning from your back to your side while in a flat bed without using bedrails?: A Lot Help needed moving from lying on your back to sitting on the side of a flat bed without using bedrails?: A Lot Help needed moving to and from a bed to a chair (including a wheelchair)?: Total Help needed standing up from a chair using your arms (e.g., wheelchair or bedside chair)?: Total Help needed to walk in hospital room?: Total Help needed climbing 3-5 steps with a railing? : Total 6 Click Score: 8    End of Session Equipment Utilized During Treatment: Other (comment) (helmet) Activity  Tolerance: Patient tolerated treatment well Patient left: in bed;with call bell/phone within reach;with bed alarm set Nurse Communication: Mobility status PT Visit Diagnosis: Other abnormalities of gait and mobility (R26.89);Hemiplegia and hemiparesis;Other symptoms and signs involving the nervous system (R29.898);Muscle weakness (generalized) (M62.81);Difficulty in walking, not elsewhere classified (R26.2) Hemiplegia - Right/Left: Left Hemiplegia - dominant/non-dominant: Non-dominant Hemiplegia - caused by: Cerebral infarction     Time: 0830-0900 PT Time Calculation (min) (ACUTE ONLY): 30 min  Charges:  $Therapeutic Activity: 8-22 mins                     Aida Raider, PT  Office # [phone] Pager [phone]    Ilda Foil 08/03/2020, 10:15 AM

## 2020-08-03 NOTE — Progress Notes (Addendum)
1:25pm: Patient will go to [place] - she will be transported via [place], transportation has been arranged for first available. The number to call for report is [phone].  RN aware of discharge plan.  8:15am: CSW spoke with Antionette at [facility] - auth was obtained from [facility] but business office needs to verify one additional item prior to the patient coming. Antionette requested a COVID test for patient - NP notified.  Edwin Dada, MSW, LCSW Transitions of Care  Clinical Social Worker II [phone]

## 2020-08-04 ENCOUNTER — Emergency Department (HOSPITAL_COMMUNITY)
Admission: EM | Admit: 2020-08-04 | Discharge: 2020-08-05 | Disposition: A | Discharge status: Home / Self Care | Payer: Medicaid Other | Source: Home / Self Care | Attending: Emergency Medicine | Admitting: Emergency Medicine

## 2020-08-04 ENCOUNTER — Emergency Department (HOSPITAL_COMMUNITY): Payer: Medicaid Other

## 2020-08-04 ENCOUNTER — Emergency Department (HOSPITAL_COMMUNITY)
Admission: EM | Admit: 2020-08-04 | Discharge: 2020-08-04 | Disposition: A | Discharge status: Home / Self Care | Payer: Medicaid Other | Attending: Emergency Medicine | Admitting: Emergency Medicine

## 2020-08-04 DIAGNOSIS — Z7901 Long term (current) use of anticoagulants: Secondary | ICD-10-CM | POA: Diagnosis not present

## 2020-08-04 DIAGNOSIS — Z79899 Other long term (current) drug therapy: Secondary | ICD-10-CM | POA: Diagnosis not present

## 2020-08-04 DIAGNOSIS — Z4502 Encounter for adjustment and management of automatic implantable cardiac defibrillator: Secondary | ICD-10-CM | POA: Insufficient documentation

## 2020-08-04 DIAGNOSIS — F17210 Nicotine dependence, cigarettes, uncomplicated: Secondary | ICD-10-CM | POA: Insufficient documentation

## 2020-08-04 DIAGNOSIS — R531 Weakness: Secondary | ICD-10-CM | POA: Insufficient documentation

## 2020-08-04 DIAGNOSIS — K9423 Gastrostomy malfunction: Secondary | ICD-10-CM | POA: Diagnosis present

## 2020-08-04 DIAGNOSIS — G8192 Hemiplegia, unspecified affecting left dominant side: Secondary | ICD-10-CM | POA: Insufficient documentation

## 2020-08-04 DIAGNOSIS — I10 Essential (primary) hypertension: Secondary | ICD-10-CM | POA: Insufficient documentation

## 2020-08-04 DIAGNOSIS — R519 Headache, unspecified: Secondary | ICD-10-CM | POA: Insufficient documentation

## 2020-08-04 DIAGNOSIS — Z794 Long term (current) use of insulin: Secondary | ICD-10-CM | POA: Insufficient documentation

## 2020-08-04 DIAGNOSIS — E11311 Type 2 diabetes mellitus with unspecified diabetic retinopathy with macular edema: Secondary | ICD-10-CM | POA: Insufficient documentation

## 2020-08-04 DIAGNOSIS — K9420 Gastrostomy complication, unspecified: Secondary | ICD-10-CM | POA: Diagnosis not present

## 2020-08-04 DIAGNOSIS — W19XXXA Unspecified fall, initial encounter: Secondary | ICD-10-CM | POA: Insufficient documentation

## 2020-08-04 MED ORDER — DIATRIZOATE MEGLUMINE & SODIUM 66-10 % PO SOLN: 50.0000 mL | Freq: Once | ORAL | Status: DC

## 2020-08-04 MED ORDER — METOPROLOL TARTRATE 25 MG PO TABS
50.0000 mg | ORAL_TABLET | Freq: Once | ORAL | Status: AC
  Administered 2020-08-04: 50 mg
  Filled 2020-08-04: qty 2

## 2020-08-04 MED ORDER — IOHEXOL 300 MG/ML  SOLN
30.0000 mL | Freq: Once | INTRAMUSCULAR | Status: AC | PRN
  Administered 2020-08-04: 30 mL

## 2020-08-04 NOTE — ED Notes (Signed)
PT[facility] called  

## 2020-08-04 NOTE — ED Notes (Signed)
[facility] present for pt transfer 

## 2020-08-04 NOTE — ED Notes (Signed)
[facility] called for pt transfer to [facility]

## 2020-08-04 NOTE — ED Provider Notes (Signed)
[facility] EMERGENCY DEPARTMENT Provider Note   CSN: [id number] Arrival date & time: 08/04/20  1730     History Chief Complaint  Patient presents with  . Fall    CRESTA RIDEN is a 35 y.o. female.  The history is provided by the patient, the EMS personnel and medical records.   ROSENA BARTLE is a 35 y.o. female who presents to the Emergency Department complaining of fall. She presents the emergency department as a level II trauma alert following fall on thinners. She is currently a resident at [place] due to history of CVA. She is on eliquis. She rolled out of bed without wearing her helmet and struck her head. No loss of consciousness. She has no complaints of pain on ED evaluation.    Past Medical History:  Diagnosis Date  . Diabetes mellitus without complication (HCC)   . Hypertension     Patient Active Problem List   Diagnosis Date Noted  . Fever   . Acute occlusion of artery of upper extremity due to thrombus (HCC)   . Dysphagia, pharyngoesophageal phase   . 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 Benign essential hypertension 07/28/2020  . Thrombocytosis   . Leukocytosis   . Arterial thrombosis (HCC)   . 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 Cellulitis 02/26/2016    Past Surgical History:  Procedure Laterality Date  . BUBBLE STUDY  07/19/2020   Procedure: BUBBLE STUDY;  Surgeon: Little Ishikawa, MD;  Location: [facility] ENDOSCOPY;  Service: Cardiovascular;;  . CESAREAN SECTION    . CHOLECYSTECTOMY    . CRANIOTOMY Right 07/06/2020   Procedure: RIGHT CRANIECTOMY WITH PLACEMENT OF BONE FLAP IN ABDOMEN;  Surgeon: Maeola Harman, MD;  Location: [facility] OR;  Service: Neurosurgery;  Laterality: Right;  . ESOPHAGOGASTRODUODENOSCOPY (EGD) WITH PROPOFOL N/A 07/26/2020   Procedure:  ESOPHAGOGASTRODUODENOSCOPY (EGD) WITH PROPOFOL;  Surgeon: Violeta Gelinas, MD;  Location: [facility] ENDOSCOPY;  Service: General;  Laterality: N/A;  . IR ANGIO INTRA EXTRACRAN SEL COM CAROTID INNOMINATE UNI L MOD SED  07/04/2020  . IR ANGIO VERTEBRAL SEL VERTEBRAL UNI R MOD SED  07/04/2020  . IR CT HEAD LTD  07/04/2020  . IR CT HEAD LTD  07/04/2020  . IR PERCUTANEOUS ART THROMBECTOMY/INFUSION INTRACRANIAL INC DIAG ANGIO  07/04/2020  . PEG PLACEMENT N/A 07/26/2020   Procedure: PERCUTANEOUS ENDOSCOPIC GASTROSTOMY (PEG) PLACEMENT;  Surgeon: Violeta Gelinas, MD;  Location: [facility] ENDOSCOPY;  Service: General;  Laterality: N/A;  . RADIOLOGY WITH ANESTHESIA N/A 07/04/2020   Procedure: IR WITH ANESTHESIA;  Surgeon: Radiologist, Medication, MD;  Location: [facility] OR;  Service: Radiology;  Laterality: N/A;  . TEE WITHOUT CARDIOVERSION N/A 07/19/2020   Procedure: TRANSESOPHAGEAL ECHOCARDIOGRAM (TEE);  Surgeon: Little Ishikawa, MD;  Location: [facility] ENDOSCOPY;  Service: Cardiovascular;  Laterality: N/A;  . THROMBECTOMY BRACHIAL ARTERY Right 07/05/2020   Procedure: RIGHT UPPER EXTREMITY THROMBECTOMY;  Surgeon: Cephus Shelling, MD;  Location: [facility] OR;  Service: Vascular;  Laterality: Right;     OB History   No obstetric history on file.     Family History  Problem Relation Age of Onset  . Diabetes Maternal Grandmother     Social History   Tobacco Use  . Smoking status: Current Every Day Smoker    Packs/day: 0.10    Types: Cigarettes  . Smokeless tobacco: Never Used  Substance Use Topics  . Alcohol use: Yes    Comment: occ.  . Drug use: No    Home Medications Prior to Admission medications   Medication Sig Start Date End Date Taking? Authorizing Provider  acetaminophen (TYLENOL) 160 MG/5ML solution Place 20.3 mLs (650 mg total) into feeding tube every 4 (four) hours as needed for mild pain (or temp > 37.5 C (99.5 F)). 08/03/20   Russella Dar, NP  acetaminophen (TYLENOL) 325 MG tablet Take 2 tablets (650  mg total) by mouth every 4 (four) hours as needed for mild pain (or temp > 37.5 C (99.5 F)). 08/03/20   Russella Dar, NP  apixaban (ELIQUIS) 5 MG TABS tablet Place 1 tablet (5 mg total) into feeding tube 2 (two) times daily. 08/03/20   Russella Dar, NP  atorvastatin (LIPITOR) 40 MG tablet Place 1 tablet (40 mg total) into feeding tube daily at 6 PM. 08/03/20   Russella Dar, NP  bisacodyl (DULCOLAX) 10 MG suppository Place 1 suppository (10 mg total) rectally daily as needed for moderate constipation. 08/03/20   Russella Dar, NP  docusate (COLACE) 50 MG/5ML liquid Place 10 mLs (100 mg total) into feeding tube 2 (two) times daily. 08/03/20   Russella Dar, NP  gabapentin (NEURONTIN) 250 MG/5ML solution Place 12 mLs (600 mg total) into feeding tube every 8 (eight) hours. 08/03/20   Russella Dar, NP  insulin aspart (NOVOLOG) 100 UNIT/ML injection Inject 0-15 Units into the skin every 4 (four) hours. Correction coverage: Moderate (average weight, post-op)  CBG < 70: Implement Hypoglycemia Standing Orders and refer to Hypoglycemia Standing Orders sidebar report  CBG 70 - 120: 0 units  CBG 121 - 150: 2 units  CBG 151 - 200: 3 units  CBG 201 - 250: 5 units  CBG 251 - 300: 8 units  CBG 301 - 350: 11 units  CBG 351 - 400: 15 units  CBG > 400 call MD and obtain STAT lab verification 08/03/20   Russella Dar, NP  insulin aspart (NOVOLOG) 100 UNIT/ML injection Inject 6 Units into the skin every 4 (four) hours. 08/03/20   Russella Dar, NP  insulin detemir (LEVEMIR) 100 UNIT/ML injection Inject 0.35 mLs (35 Units total) into the skin 2 (two) times daily. 08/03/20   Russella Dar, NP  melatonin 3 MG TABS tablet Place 1 tablet (3 mg total) into feeding tube at bedtime. 08/03/20   Russella Dar, NP  metoprolol tartrate (LOPRESSOR) 50 MG tablet Place 1 tablet (50 mg total) into feeding tube 2 (two) times daily. 08/03/20   Russella Dar, NP  Multiple Vitamin (MULTIVITAMIN WITH MINERALS)  TABS tablet Place 1 tablet into feeding tube daily. 08/04/20   Russella Dar, NP  Nutritional Supplements (FEEDING SUPPLEMENT, GLUCERNA 1.5 CAL,) LIQD Place 1,000 mLs into feeding tube continuous. 08/03/20   Russella Dar, NP  polyethylene glycol (MIRALAX / GLYCOLAX) 17 g packet Place 17 g into feeding tube daily. 08/04/20   Russella Dar, NP  senna-docusate (SENOKOT-S) 8.6-50 MG tablet Place 1 tablet into feeding tube at bedtime as needed for mild constipation. 08/03/20   Russella Dar, NP  sertraline (ZOLOFT) 25 MG tablet Place 1 tablet (25 mg total) into feeding tube daily. 08/04/20   Russella Dar, NP  valproic acid (DEPAKENE) 250 MG/5ML solution Place 5 mLs (250 mg total) into feeding tube 2 (two) times daily. 08/03/20   Russella Dar, NP  Water  For Irrigation, Sterile (FREE WATER) SOLN Place 200 mLs into feeding tube every 6 (six) hours. 08/03/20   Russella Dar, NP    Allergies    Norco [hydrocodone-acetaminophen]  Review of Systems   Review of Systems  All other systems reviewed and are negative.   Physical Exam Updated Vital Signs BP (!) 132/100   Pulse (!) 110   Temp 98.8 F (37.1 C) (Oral)   Resp 20   SpO2 98%   Physical Exam Vitals and nursing note reviewed.  Constitutional:      Appearance: She is well-developed.  HENT:     Head: Normocephalic.     Comments: There is a healing surgical wound to the right side of the head with local soft tissue swelling. Cardiovascular:     Rate and Rhythm: Normal rate and regular rhythm.     Heart sounds: No murmur heard.   Pulmonary:     Effort: Pulmonary effort is normal. No respiratory distress.     Breath sounds: Normal breath sounds.  Abdominal:     Palpations: Abdomen is soft.     Tenderness: There is no abdominal tenderness. There is no guarding or rebound.  Musculoskeletal:        General: No tenderness.  Skin:    General: Skin is warm and dry.  Neurological:     Mental Status: She is alert and  oriented to person, place, and time.     Comments: Right gaze preference. Left hemiparesis.  Psychiatric:        Behavior: Behavior normal.     ED Results / Procedures / Treatments   Labs (all labs ordered are listed, but only abnormal results are displayed) Labs Reviewed - No data to display  EKG None  Radiology DG Abdomen 1 View  Result Date: 08/04/2020 CLINICAL DATA:  Replaced PEG tube EXAM: ABDOMEN - 1 VIEW COMPARISON:  07/18/2020 FINDINGS: A left upper quadrant gastrostomy tube is present, inserted along the upper greater curvature region of the stomach. Contrast material is demonstrated in the stomach and duodenum consistent with location of the tube in the stomach. No contrast extravasation is noted. IMPRESSION: Gastrostomy tube tip is in the stomach. No contrast extravasation. Electronically Signed   By: Burman Nieves M.D.   On: 08/04/2020 03:18   CT Head Wo Contrast  Result Date: 08/04/2020 CLINICAL DATA:  Head and neck pain after falling out of bed. EXAM: CT HEAD WITHOUT CONTRAST CT CERVICAL SPINE WITHOUT CONTRAST TECHNIQUE: Multidetector CT imaging of the head and cervical spine was performed following the standard protocol without intravenous contrast. Multiplanar CT image reconstructions of the cervical spine were also generated. COMPARISON:  CT head dated 07/07/2020. FINDINGS: CT HEAD FINDINGS Brain: The patient is status post a decompressive craniectomy on the right with herniation of the right cerebrum through the craniectomy defect. There has been interval evolution of the patient's large volume right middle cerebral artery territory infarct involving the cortex and subcortical structures with decreased swelling and mass effect. Midline shift has resolved since prior exam. No acute intra or extra-axial fluid collection is identified. There is decreased compression of the right lateral ventricle. There is no evidence of hydrocephalus. The basilar cisterns are patent.  Vascular: No hyperdense vessel or unexpected calcification. Skull: No acute osseous injury. Sinuses/Orbits: No acute finding. Other: None. CT CERVICAL SPINE FINDINGS Alignment: Normal. Skull base and vertebrae: No acute fracture. No primary bone lesion or focal pathologic process. Soft tissues and spinal canal: No prevertebral fluid or swelling.  No visible canal hematoma. Disc levels:  Preserved. Upper chest: Negative. Other: None. IMPRESSION: 1. Interval evolution of the patient's large volume right middle cerebral artery territory infarct with decreased swelling and mass effect. No acute intracranial process. 2. No acute osseous injury in the cervical spine. Electronically Signed   By: Romona Curlsyler  Litton M.D.   On: 08/04/2020 18:21   CT Cervical Spine Wo Contrast  Result Date: 08/04/2020 CLINICAL DATA:  Head and neck pain after falling out of bed. EXAM: CT HEAD WITHOUT CONTRAST CT CERVICAL SPINE WITHOUT CONTRAST TECHNIQUE: Multidetector CT imaging of the head and cervical spine was performed following the standard protocol without intravenous contrast. Multiplanar CT image reconstructions of the cervical spine were also generated. COMPARISON:  CT head dated 07/07/2020. FINDINGS: CT HEAD FINDINGS Brain: The patient is status post a decompressive craniectomy on the right with herniation of the right cerebrum through the craniectomy defect. There has been interval evolution of the patient's large volume right middle cerebral artery territory infarct involving the cortex and subcortical structures with decreased swelling and mass effect. Midline shift has resolved since prior exam. No acute intra or extra-axial fluid collection is identified. There is decreased compression of the right lateral ventricle. There is no evidence of hydrocephalus. The basilar cisterns are patent. Vascular: No hyperdense vessel or unexpected calcification. Skull: No acute osseous injury. Sinuses/Orbits: No acute finding. Other: None. CT  CERVICAL SPINE FINDINGS Alignment: Normal. Skull base and vertebrae: No acute fracture. No primary bone lesion or focal pathologic process. Soft tissues and spinal canal: No prevertebral fluid or swelling. No visible canal hematoma. Disc levels:  Preserved. Upper chest: Negative. Other: None. IMPRESSION: 1. Interval evolution of the patient's large volume right middle cerebral artery territory infarct with decreased swelling and mass effect. No acute intracranial process. 2. No acute osseous injury in the cervical spine. Electronically Signed   By: Romona Curlsyler  Litton M.D.   On: 08/04/2020 18:21   DG Chest Port 1 View  Result Date: 08/04/2020 CLINICAL DATA:  Fall EXAM: PORTABLE CHEST 1 VIEW COMPARISON:  07/27/2020 FINDINGS: The heart size and mediastinal contours are within normal limits. Both lungs are clear. The visualized skeletal structures are unremarkable. IMPRESSION: No active disease. Electronically Signed   By: Duanne GuessNicholas  Plundo D.O.   On: 08/04/2020 17:41    Procedures Procedures   Medications Ordered in ED Medications - No data to display  ED Course  I have reviewed the triage vital signs and the nursing notes.  Pertinent labs & imaging results that were available during my care of the patient were reviewed by me and considered in my medical decision making (see chart for details).    MDM Rules/Calculators/A&P                         Patient with history of CVA status post craniotomy with partial craniectomy here for evaluation following a fall. She is anticoagulated. She has no additional symptoms. Plan to obtain CT head to rule out bleed.  CT is negative for acute abnormalities. Patient is mildly tachycardic but this appears to be at baseline per chart review. Pt without complaints on recheck.  Attempted to contact family, no answer when called. Plan to discharge back to facility for ongoing treatment.  Final Clinical Impression(s) / ED Diagnoses Final diagnoses:  Fall, initial  encounter    Rx / DC Orders ED Discharge Orders    None       Tilden Fossaees, Rueben Kassim, MD 08/04/20  1910  

## 2020-08-04 NOTE — ED Triage Notes (Signed)
Pt came in from [place] after feeding tube was pulled out by pt.

## 2020-08-04 NOTE — ED Notes (Signed)
bloodwork and IV deferred per EDP.

## 2020-08-04 NOTE — ED Notes (Signed)
20 fr. Feeding tube placed by provider

## 2020-08-04 NOTE — ED Triage Notes (Signed)
PT BIB [facility] from [facility] for a fall from bed on thinners.   PT takes Eliquis and is missing part of her skull on the right side of her head r/t a previous stroke. Pt also has rt sided weakness from hx of strokes.  No LOC, no pain reported by EMS.

## 2020-08-04 NOTE — ED Provider Notes (Signed)
[facility]-EMERGENCY DEPT Provider Note  CSN: [id number] Arrival date & time: 08/04/20 0208  Chief Complaint(s) No chief complaint on file.  HPI Brandi Myers is a 35 y.o. 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  Per EMS this occurred 20 minutes prior to arrival.  Remainder of history, ROS, and physical exam limited due to patient's condition ( baseline cognitive changes from stroke). Additional information was obtained from EMS.   Level V Caveat.    HPI  Past Medical History Past Medical History:  Diagnosis Date  . Diabetes mellitus without complication (HCC)   . Hypertension    Patient Active Problem List   Diagnosis Date Noted  . Fever   . Acute occlusion of artery of upper extremity due to thrombus (HCC)   . Dysphagia, pharyngoesophageal phase   . 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 Benign essential hypertension 07/28/2020  . Thrombocytosis   . Leukocytosis   . Arterial thrombosis (HCC)   . 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 Cellulitis 02/26/2016   Home Medication(s) Prior to Admission medications   Medication Sig Start Date End Date Taking? Authorizing Provider  acetaminophen (TYLENOL) 160 MG/5ML solution Place 20.3 mLs (650 mg total) into feeding tube every 4 (four) hours as needed for mild pain (or temp > 37.5 C (99.5 F)). 08/03/20   Russella Dar, NP  acetaminophen (TYLENOL) 325 MG tablet Take 2 tablets (650 mg total) by mouth every 4 (four) hours as needed for mild pain (or temp > 37.5 C (99.5 F)). 08/03/20   Russella Dar, NP  apixaban (ELIQUIS) 5 MG TABS  tablet Place 1 tablet (5 mg total) into feeding tube 2 (two) times daily. 08/03/20   Russella Dar, NP  atorvastatin (LIPITOR) 40 MG tablet Place 1 tablet (40 mg total) into feeding tube daily at 6 PM. 08/03/20   Russella Dar, NP  bisacodyl (DULCOLAX) 10 MG suppository Place 1 suppository (10 mg total) rectally daily as needed for moderate constipation. 08/03/20   Russella Dar, NP  docusate (COLACE) 50 MG/5ML liquid Place 10 mLs (100 mg total) into feeding tube 2 (two) times daily. 08/03/20   Russella Dar, NP  gabapentin (NEURONTIN) 250 MG/5ML solution Place 12 mLs (600 mg total) into feeding tube every 8 (eight) hours. 08/03/20   Russella Dar, NP  insulin aspart (NOVOLOG) 100 UNIT/ML injection Inject 0-15 Units into the skin every 4 (four) hours. Correction coverage: Moderate (average weight, post-op)  CBG < 70: Implement Hypoglycemia Standing Orders and refer to Hypoglycemia Standing Orders sidebar report  CBG 70 - 120: 0 units  CBG 121 - 150: 2 units  CBG 151 - 200: 3 units  CBG 201 - 250: 5 units  CBG 251 - 300: 8 units  CBG 301 - 350: 11 units  CBG 351 - 400: 15 units  CBG > 400 call MD and obtain STAT lab verification 08/03/20   Russella Dar, NP  insulin aspart (NOVOLOG) 100 UNIT/ML injection Inject 6 Units into the skin every 4 (four) hours. 08/03/20   Russella Dar, NP  insulin detemir (LEVEMIR) 100 UNIT/ML injection Inject 0.35 mLs (  35 Units total) into the skin 2 (two) times daily. 08/03/20   Russella Dar, NP  melatonin 3 MG TABS tablet Place 1 tablet (3 mg total) into feeding tube at bedtime. 08/03/20   Russella Dar, NP  metoprolol tartrate (LOPRESSOR) 50 MG tablet Place 1 tablet (50 mg total) into feeding tube 2 (two) times daily. 08/03/20   Russella Dar, NP  Multiple Vitamin (MULTIVITAMIN WITH MINERALS) TABS tablet Place 1 tablet into feeding tube daily. 08/04/20   Russella Dar, NP  Nutritional Supplements (FEEDING SUPPLEMENT, GLUCERNA 1.5 CAL,) LIQD  Place 1,000 mLs into feeding tube continuous. 08/03/20   Russella Dar, NP  polyethylene glycol (MIRALAX / GLYCOLAX) 17 g packet Place 17 g into feeding tube daily. 08/04/20   Russella Dar, NP  senna-docusate (SENOKOT-S) 8.6-50 MG tablet Place 1 tablet into feeding tube at bedtime as needed for mild constipation. 08/03/20   Russella Dar, NP  sertraline (ZOLOFT) 25 MG tablet Place 1 tablet (25 mg total) into feeding tube daily. 08/04/20   Russella Dar, NP  valproic acid (DEPAKENE) 250 MG/5ML solution Place 5 mLs (250 mg total) into feeding tube 2 (two) times daily. 08/03/20   Russella Dar, NP  Water For Irrigation, Sterile (FREE WATER) SOLN Place 200 mLs into feeding tube every 6 (six) hours. 08/03/20   Russella Dar, NP                                                                                                                                    Past Surgical History Past Surgical History:  Procedure Laterality Date  . BUBBLE STUDY  07/19/2020   Procedure: BUBBLE STUDY;  Surgeon: Little Ishikawa, MD;  Location: [facility] ENDOSCOPY;  Service: Cardiovascular;;  . CESAREAN SECTION    . CHOLECYSTECTOMY    . CRANIOTOMY Right 07/06/2020   Procedure: RIGHT CRANIECTOMY WITH PLACEMENT OF BONE FLAP IN ABDOMEN;  Surgeon: Maeola Harman, MD;  Location: [facility] OR;  Service: Neurosurgery;  Laterality: Right;  . ESOPHAGOGASTRODUODENOSCOPY (EGD) WITH PROPOFOL N/A 07/26/2020   Procedure: ESOPHAGOGASTRODUODENOSCOPY (EGD) WITH PROPOFOL;  Surgeon: Violeta Gelinas, MD;  Location: [facility] ENDOSCOPY;  Service: General;  Laterality: N/A;  . IR ANGIO INTRA EXTRACRAN SEL COM CAROTID INNOMINATE UNI L MOD SED  07/04/2020  . IR ANGIO VERTEBRAL SEL VERTEBRAL UNI R MOD SED  07/04/2020  . IR CT HEAD LTD  07/04/2020  . IR CT HEAD LTD  07/04/2020  . IR PERCUTANEOUS ART THROMBECTOMY/INFUSION INTRACRANIAL INC DIAG ANGIO  07/04/2020  . PEG PLACEMENT N/A 07/26/2020   Procedure: PERCUTANEOUS ENDOSCOPIC GASTROSTOMY (PEG) PLACEMENT;   Surgeon: Violeta Gelinas, MD;  Location: [facility] ENDOSCOPY;  Service: General;  Laterality: N/A;  . RADIOLOGY WITH ANESTHESIA N/A 07/04/2020   Procedure: IR WITH ANESTHESIA;  Surgeon: Radiologist, Medication, MD;  Location: [facility] OR;  Service: Radiology;  Laterality: N/A;  . TEE WITHOUT CARDIOVERSION N/A 07/19/2020  Procedure: TRANSESOPHAGEAL ECHOCARDIOGRAM (TEE);  Surgeon: Little Ishikawa, MD;  Location: [facility] ENDOSCOPY;  Service: Cardiovascular;  Laterality: N/A;  . THROMBECTOMY BRACHIAL ARTERY Right 07/05/2020   Procedure: RIGHT UPPER EXTREMITY THROMBECTOMY;  Surgeon: Cephus Shelling, MD;  Location: [facility] OR;  Service: Vascular;  Laterality: Right;   Family History Family History  Problem Relation Age of Onset  . Diabetes Maternal Grandmother     Social History Social History   Tobacco Use  . Smoking status: Current Every Day Smoker    Packs/day: 0.10    Types: Cigarettes  . Smokeless tobacco: Never Used  Substance Use Topics  . Alcohol use: Yes    Comment: occ.  . Drug use: No   Allergies Norco [hydrocodone-acetaminophen]  Review of Systems Review of Systems  Unable to perform ROS: Mental status change    Physical Exam Vital Signs  I have reviewed the triage vital signs BP (!) 128/92 (BP Location: Left Arm)   Pulse (!) 102   Temp 98.5 F (36.9 C) (Oral)   Resp 18   SpO2 98%   Physical Exam Vitals reviewed.  Constitutional:      General: She is not in acute distress.    Appearance: She is well-developed. She is not diaphoretic.  HENT:     Head: Normocephalic and atraumatic.     Comments: Protective helmet on    Right Ear: External ear normal.     Left Ear: External ear normal.     Nose: Nose normal.  Eyes:     General: No scleral icterus.    Conjunctiva/sclera: Conjunctivae normal.  Neck:     Trachea: Phonation normal.  Cardiovascular:     Rate and Rhythm: Normal rate and regular rhythm.  Pulmonary:     Effort: Pulmonary effort is normal. No respiratory  distress.     Breath sounds: No stridor.  Abdominal:     General: There is no distension.  LUQ PEG insertion site clean. No discharge or erythema    Tenderness: There is no abdominal tenderness.  Musculoskeletal:        General: Normal range of motion.       Arms: right thumb ischemic with dry gangrene (noted during recent hospitalization)     Cervical back: Normal range of motion.  Neurological:     Mental Status: She is alert and oriented to person, place, and time.     Comments: Left sided hemiplegia  Psychiatric:        Behavior: Behavior normal.     ED Results and Treatments Labs (all labs ordered are listed, but only abnormal results are displayed) Labs Reviewed - No data to display                                                                                                                       EKG  EKG Interpretation  Date/Time:    Ventricular Rate:    PR Interval:    QRS Duration:   QT Interval:  QTC Calculation:   R Axis:     Text Interpretation:        Radiology DG Abdomen 1 View  Result Date: 08/04/2020 CLINICAL DATA:  Replaced PEG tube EXAM: ABDOMEN - 1 VIEW COMPARISON:  07/18/2020 FINDINGS: A left upper quadrant gastrostomy tube is present, inserted along the upper greater curvature region of the stomach. Contrast material is demonstrated in the stomach and duodenum consistent with location of the tube in the stomach. No contrast extravasation is noted. IMPRESSION: Gastrostomy tube tip is in the stomach. No contrast extravasation. Electronically Signed   By: Burman Nieves M.D.   On: 08/04/2020 03:18    Pertinent labs & imaging results that were available during my care of the patient were reviewed by me and considered in my medical decision making (see chart for details).  Medications Ordered in ED Medications  diatrizoate meglumine-sodium (GASTROGRAFIN) 66-10 % solution 50 mL (50 mLs Per Tube Not Given 08/04/20 0309)  iohexol (OMNIPAQUE) 300 MG/ML  solution 30 mL (30 mLs Per Tube Contrast Given 08/04/20 0306)                                                                                                                                    Procedures FEEDING TUBE REPLACEMENT  Date/Time: 08/04/2020 3:27 AM Performed by: Nira Conn, MD Authorized by: Nira Conn, MD      Consent: Verbal consent obtained. Consent given by: patient Patient understanding: patient states understanding of the procedure being performed Patient consent: the patient's understanding of the procedure matches consent given Patient identity confirmed: arm band Indications: tube dislodged Tube type: gastrostomy     (including critical care time)  Medical Decision Making / ED Course I have reviewed the nursing notes for this encounter and the patient's prior records (if available in EHR or on provided paperwork).   DEXTER SAUSER was evaluated in Emergency Department on 08/04/2020 for the symptoms described in the history of present illness. She was evaluated in the context of the global COVID-19 pandemic, which necessitated consideration that the patient might be at risk for infection with the SARS-CoV-2 virus that causes COVID-19. Institutional protocols and algorithms that pertain to the evaluation of patients at risk for COVID-19 are in a state of rapid change based on information released by regulatory bodies including the CDC and federal and state organizations. These policies and algorithms were followed during the patient's care in the ED.  PEG tube replace. KUB confirmed location.      Final Clinical Impression(s) / ED Diagnoses Final diagnoses:  Complication of feeding tube [facility])   The patient appears reasonably screened and/or stabilized for discharge and I doubt any other medical condition or other [facility] requiring further screening, evaluation, or treatment in the ED at this time prior to discharge. Safe for discharge with  strict return precautions.  Disposition: Discharge  Condition: Good  I have discussed the results, Dx  and Tx plan with the patient/family who expressed understanding and agree(s) with the plan. Discharge instructions discussed at length. The patient/family was given strict return precautions who verbalized understanding of the instructions. No further questions at time of discharge.    ED Discharge Orders    None       Follow Up: [facility] [address] [place] [address] [phone]  Call  as needed      This chart was dictated using voice recognition software.  Despite best efforts to proofread,  errors can occur which can change the documentation meaning.   Nira Connardama, Arlander Gillen Eduardo, MD 08/04/20 [phone]

## 2020-08-06 LAB — JAK2  V617F QUAL. WITH REFLEX TO EXON 12: Reflex:: 15

## 2020-08-06 LAB — JAK2 EXONS 12-15

## 2020-08-06 LAB — CALRETICULIN (CALR) MUTATION ANALYSIS

## 2020-08-17 ENCOUNTER — Other Ambulatory Visit: Payer: Self-pay | Admitting: *Deleted

## 2020-08-17 DIAGNOSIS — I742 Embolism and thrombosis of arteries of the upper extremities: Secondary | ICD-10-CM

## 2020-08-21 ENCOUNTER — Encounter (HOSPITAL_COMMUNITY): Payer: Self-pay | Admitting: Pharmacy Technician

## 2020-08-21 ENCOUNTER — Emergency Department (HOSPITAL_COMMUNITY): Payer: Medicaid Other

## 2020-08-21 ENCOUNTER — Emergency Department (HOSPITAL_COMMUNITY)
Admission: EM | Admit: 2020-08-21 | Discharge: 2020-08-21 | Disposition: A | Discharge status: Home / Self Care | Payer: Medicaid Other | Attending: Emergency Medicine | Admitting: Emergency Medicine

## 2020-08-21 ENCOUNTER — Other Ambulatory Visit: Payer: Self-pay

## 2020-08-21 DIAGNOSIS — W19XXXA Unspecified fall, initial encounter: Secondary | ICD-10-CM

## 2020-08-21 DIAGNOSIS — F17210 Nicotine dependence, cigarettes, uncomplicated: Secondary | ICD-10-CM | POA: Insufficient documentation

## 2020-08-21 DIAGNOSIS — E11311 Type 2 diabetes mellitus with unspecified diabetic retinopathy with macular edema: Secondary | ICD-10-CM | POA: Diagnosis not present

## 2020-08-21 DIAGNOSIS — W06XXXA Fall from bed, initial encounter: Secondary | ICD-10-CM | POA: Insufficient documentation

## 2020-08-21 DIAGNOSIS — M25512 Pain in left shoulder: Secondary | ICD-10-CM | POA: Diagnosis present

## 2020-08-21 DIAGNOSIS — Z7901 Long term (current) use of anticoagulants: Secondary | ICD-10-CM | POA: Insufficient documentation

## 2020-08-21 DIAGNOSIS — Z794 Long term (current) use of insulin: Secondary | ICD-10-CM | POA: Insufficient documentation

## 2020-08-21 DIAGNOSIS — Z79899 Other long term (current) drug therapy: Secondary | ICD-10-CM | POA: Insufficient documentation

## 2020-08-21 DIAGNOSIS — Y92129 Unspecified place in nursing home as the place of occurrence of the external cause: Secondary | ICD-10-CM | POA: Insufficient documentation

## 2020-08-21 DIAGNOSIS — I10 Essential (primary) hypertension: Secondary | ICD-10-CM | POA: Insufficient documentation

## 2020-08-21 DIAGNOSIS — R519 Headache, unspecified: Secondary | ICD-10-CM | POA: Insufficient documentation

## 2020-08-21 LAB — CBC WITH DIFFERENTIAL/PLATELET
Abs Immature Granulocytes: 0.03 10*3/uL (ref 0.00–0.07)
Basophils Absolute: 0 10*3/uL (ref 0.0–0.1)
Basophils Relative: 0 %
Eosinophils Absolute: 0.3 10*3/uL (ref 0.0–0.5)
Eosinophils Relative: 3 %
HCT: 45.6 % (ref 36.0–46.0)
Hemoglobin: 14.2 g/dL (ref 12.0–15.0)
Immature Granulocytes: 0 %
Lymphocytes Relative: 25 %
Lymphs Abs: 2.2 10*3/uL (ref 0.7–4.0)
MCH: 26.7 pg (ref 26.0–34.0)
MCHC: 31.1 g/dL (ref 30.0–36.0)
MCV: 85.9 fL (ref 80.0–100.0)
Monocytes Absolute: 0.7 10*3/uL (ref 0.1–1.0)
Monocytes Relative: 8 %
Neutro Abs: 5.6 10*3/uL (ref 1.7–7.7)
Neutrophils Relative %: 64 %
Platelets: 257 10*3/uL (ref 150–400)
RBC: 5.31 MIL/uL — ABNORMAL HIGH (ref 3.87–5.11)
RDW: 21.8 % — ABNORMAL HIGH (ref 11.5–15.5)
WBC: 8.8 10*3/uL (ref 4.0–10.5)
nRBC: 0 % (ref 0.0–0.2)

## 2020-08-21 LAB — BASIC METABOLIC PANEL
Anion gap: 8 (ref 5–15)
BUN: 5 mg/dL — ABNORMAL LOW (ref 6–20)
CO2: 28 mmol/L (ref 22–32)
Calcium: 9.4 mg/dL (ref 8.9–10.3)
Chloride: 98 mmol/L (ref 98–111)
Creatinine, Ser: 0.45 mg/dL (ref 0.44–1.00)
GFR, Estimated: >60 mL/min (ref >60)
Glucose, Bld: 255 mg/dL — ABNORMAL HIGH (ref 70–99)
Potassium: 4 mmol/L (ref 3.5–5.1)
Sodium: 134 mmol/L — ABNORMAL LOW (ref 135–145)

## 2020-08-21 NOTE — ED Provider Notes (Signed)
[facility] EMERGENCY DEPARTMENT Provider Note   CSN: [id number] Arrival date & time: 08/21/20  1240     History No chief complaint on file.   Brandi Myers is a 35 y.o. female who presents from [facility] skilled nursing facility for evaluation after fall.  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  Patient developed severe cerebral edema that required a craniotomy.  She has a piece of skull in her abdomen on the right side.  She wears a helmet at all times to protect her skull especially since she has frequent long falls.  She frequently falls out of her bed and there is a mattress on the right side of her bed however she rolled out of the bed onto the other side and hit the floor today.  She also fell out of her wheelchair yesterday.  She is complaining of pain in her left shoulder and headache.  She denies any other pain at this time except chronic pain in her hand.  HPI     Past Medical History:  Diagnosis Date  . Diabetes mellitus without complication (HCC)   . Hypertension     Patient Active Problem List   Diagnosis Date Noted  . Fever   . Acute occlusion of artery of upper extremity due to thrombus (HCC)   . Dysphagia, pharyngoesophageal phase   . 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 Benign essential hypertension 07/28/2020  . Thrombocytosis   . Leukocytosis   . Arterial thrombosis (HCC)   . 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 Cellulitis 02/26/2016    Past Surgical History:  Procedure Laterality Date  . BUBBLE STUDY  07/19/2020   Procedure: BUBBLE STUDY;  Surgeon: Little Ishikawa, MD;  Location: [facility] ENDOSCOPY;  Service: Cardiovascular;;  . CESAREAN SECTION    .  CHOLECYSTECTOMY    . CRANIOTOMY Right 07/06/2020   Procedure: RIGHT CRANIECTOMY WITH PLACEMENT OF BONE FLAP IN ABDOMEN;  Surgeon: Maeola Harman, MD;  [facility] OR;  Service: Neurosurgery;  Laterality: Right;  . ESOPHAGOGASTRODUODENOSCOPY (EGD) WITH PROPOFOL N/A 07/26/2020   Procedure: ESOPHAGOGASTRODUODENOSCOPY (EGD) WITH PROPOFOL;  Surgeon: Violeta Gelinas, MD;  Location: [facility] ENDOSCOPY;  Service: General;  Laterality: N/A;  . IR ANGIO INTRA EXTRACRAN SEL COM CAROTID INNOMINATE UNI L MOD SED  07/04/2020  . IR ANGIO VERTEBRAL SEL VERTEBRAL UNI R MOD SED  07/04/2020  . IR CT HEAD LTD  07/04/2020  . IR CT HEAD LTD  07/04/2020  . IR PERCUTANEOUS ART THROMBECTOMY/INFUSION INTRACRANIAL INC DIAG ANGIO  07/04/2020  . PEG PLACEMENT N/A 07/26/2020   Procedure: PERCUTANEOUS ENDOSCOPIC GASTROSTOMY (PEG) PLACEMENT;  Surgeon: Violeta Gelinas, MD;  Location: [facility] ENDOSCOPY;  Service: General;  Laterality: N/A;  . RADIOLOGY WITH ANESTHESIA N/A 07/04/2020   Procedure: IR WITH ANESTHESIA;  Surgeon: Radiologist, Medication, MD;  Location: [facility] OR;  Service: Radiology;  Laterality: N/A;  . TEE WITHOUT CARDIOVERSION N/A 07/19/2020   Procedure: TRANSESOPHAGEAL ECHOCARDIOGRAM (TEE);  Surgeon: Little Ishikawa, MD;  Location: [facility] ENDOSCOPY;  Service: Cardiovascular;  Laterality: N/A;  . THROMBECTOMY BRACHIAL ARTERY Right 07/05/2020   Procedure: RIGHT UPPER EXTREMITY THROMBECTOMY;  Surgeon: Cephus Shelling, MD;  Location: [facility] OR;  Service: Vascular;  Laterality: Right;     OB History  No obstetric history on file.     Family History  Problem Relation Age of Onset  . Diabetes Maternal Grandmother     Social History   Tobacco Use  . Smoking status: Current Every Day Smoker    Packs/day: 0.10    Types: Cigarettes  . Smokeless tobacco: Never Used  Substance Use Topics  . Alcohol use: Yes    Comment: occ.  . Drug use: No    Home Medications Prior to Admission medications   Medication Sig Start Date End  Date Taking? Authorizing Provider  acetaminophen (TYLENOL) 160 MG/5ML solution Place 20.3 mLs (650 mg total) into feeding tube every 4 (four) hours as needed for mild pain (or temp > 37.5 C (99.5 F)). 08/03/20   Russella Dar, NP  acetaminophen (TYLENOL) 325 MG tablet Take 2 tablets (650 mg total) by mouth every 4 (four) hours as needed for mild pain (or temp > 37.5 C (99.5 F)). 08/03/20   Russella Dar, NP  apixaban (ELIQUIS) 5 MG TABS tablet Place 1 tablet (5 mg total) into feeding tube 2 (two) times daily. 08/03/20   Russella Dar, NP  atorvastatin (LIPITOR) 40 MG tablet Place 1 tablet (40 mg total) into feeding tube daily at 6 PM. 08/03/20   Russella Dar, NP  bisacodyl (DULCOLAX) 10 MG suppository Place 1 suppository (10 mg total) rectally daily as needed for moderate constipation. 08/03/20   Russella Dar, NP  docusate (COLACE) 50 MG/5ML liquid Place 10 mLs (100 mg total) into feeding tube 2 (two) times daily. 08/03/20   Russella Dar, NP  gabapentin (NEURONTIN) 250 MG/5ML solution Place 12 mLs (600 mg total) into feeding tube every 8 (eight) hours. 08/03/20   Russella Dar, NP  insulin aspart (NOVOLOG) 100 UNIT/ML injection Inject 0-15 Units into the skin every 4 (four) hours. Correction coverage: Moderate (average weight, post-op)  CBG < 70: Implement Hypoglycemia Standing Orders and refer to Hypoglycemia Standing Orders sidebar report  CBG 70 - 120: 0 units  CBG 121 - 150: 2 units  CBG 151 - 200: 3 units  CBG 201 - 250: 5 units  CBG 251 - 300: 8 units  CBG 301 - 350: 11 units  CBG 351 - 400: 15 units  CBG > 400 call MD and obtain STAT lab verification 08/03/20   Russella Dar, NP  insulin aspart (NOVOLOG) 100 UNIT/ML injection Inject 6 Units into the skin every 4 (four) hours. 08/03/20   Russella Dar, NP  insulin detemir (LEVEMIR) 100 UNIT/ML injection Inject 0.35 mLs (35 Units total) into the skin 2 (two) times daily. 08/03/20   Russella Dar, NP  melatonin 3 MG  TABS tablet Place 1 tablet (3 mg total) into feeding tube at bedtime. 08/03/20   Russella Dar, NP  metoprolol tartrate (LOPRESSOR) 50 MG tablet Place 1 tablet (50 mg total) into feeding tube 2 (two) times daily. 08/03/20   Russella Dar, NP  Multiple Vitamin (MULTIVITAMIN WITH MINERALS) TABS tablet Place 1 tablet into feeding tube daily. 08/04/20   Russella Dar, NP  Nutritional Supplements (FEEDING SUPPLEMENT, GLUCERNA 1.5 CAL,) LIQD Place 1,000 mLs into feeding tube continuous. 08/03/20   Russella Dar, NP  polyethylene glycol (MIRALAX / GLYCOLAX) 17 g packet Place 17 g into feeding tube daily. 08/04/20   Russella Dar, NP  senna-docusate (SENOKOT-S) 8.6-50 MG tablet Place 1 tablet into feeding tube at bedtime as needed for mild constipation. 08/03/20  Russella Dar, NP  sertraline (ZOLOFT) 25 MG tablet Place 1 tablet (25 mg total) into feeding tube daily. 08/04/20   Russella Dar, NP  valproic acid (DEPAKENE) 250 MG/5ML solution Place 5 mLs (250 mg total) into feeding tube 2 (two) times daily. 08/03/20   Russella Dar, NP  Water For Irrigation, Sterile (FREE WATER) SOLN Place 200 mLs into feeding tube every 6 (six) hours. 08/03/20   Russella Dar, NP    Allergies    Norco [hydrocodone-acetaminophen]  Review of Systems   Review of Systems Ten systems reviewed and are negative for acute change, except as noted in the HPI.   Physical Exam Updated Vital Signs BP 107/76   Pulse 89   Temp 98.8 F (37.1 C) (Temporal)   Resp 17   SpO2 98%   Physical Exam Vitals and nursing note reviewed.  Constitutional:      General: She is not in acute distress.    Appearance: She is well-developed. She is not diaphoretic.  HENT:     Head: Normocephalic.     Comments: Large well-healing surgical craniotomy scar on the right side of the head.  No obvious trauma.  Hair is matted to the left side of the head and I am unable to see underneath it however there is no palpable skull  depression outside of area of surgical craniotomy, no obvious or palpable hematomas, bleeding or lacerations. Eyes:     General: No scleral icterus.    Extraocular Movements: Extraocular movements intact.     Conjunctiva/sclera: Conjunctivae normal.     Pupils: Pupils are equal, round, and reactive to light.  Cardiovascular:     Rate and Rhythm: Normal rate and regular rhythm.     Heart sounds: Normal heart sounds. No murmur heard. No friction rub. No gallop.   Pulmonary:     Effort: Pulmonary effort is normal. No respiratory distress.     Breath sounds: Normal breath sounds.  Abdominal:     General: Bowel sounds are normal. There is no distension.     Palpations: Abdomen is soft. There is no mass.     Tenderness: There is no abdominal tenderness. There is no guarding.  Musculoskeletal:     Cervical back: Normal range of motion.     Comments: No tenderness to palpation of the left shoulder  Skin:    General: Skin is warm and dry.     Comments: First and second fingers of the right hand are blackened on the ends.  There is darkening under the nail of the right ring finger.  There is a darkened scab just superior to the right thenar eminence it appears that she has dry gangrene of the right thumb.  She has 2+ radial pulse.  Neurological:     Mental Status: She is alert and oriented to person, place, and time.  Psychiatric:        Behavior: Behavior normal.           ED Results / Procedures / Treatments   Labs (all labs ordered are listed, but only abnormal results are displayed) Labs Reviewed  BASIC METABOLIC PANEL  CBC WITH DIFFERENTIAL/PLATELET    EKG None  Radiology No results found.  Procedures Procedures   Medications Ordered in ED Medications - No data to display  ED Course  I have reviewed the triage vital signs and the nursing notes.  Pertinent labs & imaging results that were available during my care of the patient were  reviewed by me and considered  in my medical decision making (see chart for details).    MDM Rules/Calculators/A&P                          35 year old female here after fall out of her bed at her skilled nursing facility.  I ordered and reviewed labs which shows elevated glucose without significant other abnormality.  CBC without significant abnormality.  I ordered and reviewed images including CT head, C-spine, plain films of the left shoulder and right hand which show no acute abnormalities, no evidence of bony erosion on right hand film.  I of placed pictures of the patient's fingers in the chart it does appear she has direct dry gangrene but does not appear to have any bony infarction or erosion on plain films.  This was not her complaint today she does not have any significant pain outside of her normal discomfort.  Patient is advised to follow-up with her vascular doctor for further evaluation of the fingers especially if she has any worsening in their condition, increased pain swelling or redness.  The patient has no other evidence of injury today and appears otherwise appropriate for discharge at this time. Final Clinical Impression(s) / ED Diagnoses Final diagnoses:  None    Rx / DC Orders ED Discharge Orders    None       Arthor CaptainHarris, Artemisia Auvil, PA-C 08/21/20 1613    Cathren LaineSteinl, Kevin, MD 08/22/20 [id number]

## 2020-08-21 NOTE — ED Triage Notes (Signed)
Pt here via ems from [place] with multiple falls, pt on blood thinners. C/o L shoulder pain and headache. No obvious trauma. Falls were unwitnessed. Fell last night and this morning. 20g R hand, given 250cc NS en route.  VSS with ems.

## 2020-08-21 NOTE — ED Notes (Signed)
Patient Alert and oriented to baseline. Stable and ambulatory to baseline. Patient verbalized understanding of the discharge instructions.  Patient belongings were taken by the patient.   

## 2020-08-21 NOTE — Discharge Instructions (Signed)
Your imaging shows no acute injuries. Please follow up as directed. Return for any new or worsening symptoms.

## 2020-08-21 NOTE — ED Notes (Signed)
Pt returned from xray

## 2020-08-21 NOTE — ED Notes (Signed)
Pt discharged and transported back to [facility] by [facility]. 

## 2020-08-28 ENCOUNTER — Encounter: Payer: Self-pay | Admitting: Vascular Surgery

## 2020-08-28 ENCOUNTER — Ambulatory Visit (INDEPENDENT_AMBULATORY_CARE_PROVIDER_SITE_OTHER): Payer: Medicaid Other | Admitting: Vascular Surgery

## 2020-08-28 ENCOUNTER — Ambulatory Visit (HOSPITAL_COMMUNITY)
Admission: RE | Admit: 2020-08-28 | Discharge: 2020-08-28 | Disposition: A | Discharge status: Home / Self Care | Payer: Medicaid Other | Source: Ambulatory Visit | Attending: Vascular Surgery | Admitting: Vascular Surgery

## 2020-08-28 ENCOUNTER — Other Ambulatory Visit: Payer: Self-pay | Admitting: *Deleted

## 2020-08-28 ENCOUNTER — Other Ambulatory Visit: Payer: Self-pay

## 2020-08-28 ENCOUNTER — Encounter: Payer: Self-pay | Admitting: *Deleted

## 2020-08-28 VITALS — BP 109/68 | HR 83 | Temp 98.3°F | Resp 14

## 2020-08-28 DIAGNOSIS — I742 Embolism and thrombosis of arteries of the upper extremities: Secondary | ICD-10-CM

## 2020-08-28 NOTE — Progress Notes (Signed)
Patient name: Brandi Myers MRN: [id number] DOB: 1985-08-09 Sex: female  REASON FOR VISIT: Hospital follow-up after right upper extremity thrombectomy  HPI: Brandi Myers is a 35 y.o. female that presents for hospital follow-up after right upper extremity thrombectomy.  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  She then had cerebral edema requiring craniectomy.  Ultimately I performed right upper extremity thrombectomy through brachial cutdown on 07/05/20.  She now has  tissue loss on the right first and second fingers that she states is painful.  Remains on Eliquis.  Past Medical History:  Diagnosis Date  . Diabetes mellitus without complication (HCC)   . Hypertension     Past Surgical History:  Procedure Laterality Date  . BUBBLE STUDY  07/19/2020   Procedure: BUBBLE STUDY;  Surgeon: Little Ishikawa, MD;  Location: [facility] ENDOSCOPY;  Service: Cardiovascular;;  . CESAREAN SECTION    . CHOLECYSTECTOMY    . CRANIOTOMY Right 07/06/2020   Procedure: RIGHT CRANIECTOMY WITH PLACEMENT OF BONE FLAP IN ABDOMEN;  Surgeon: Maeola Harman, MD;  Location: [facility] OR;  Service: Neurosurgery;  Laterality: Right;  . ESOPHAGOGASTRODUODENOSCOPY (EGD) WITH PROPOFOL N/A 07/26/2020   Procedure: ESOPHAGOGASTRODUODENOSCOPY (EGD) WITH PROPOFOL;  Surgeon: Violeta Gelinas, MD;  Location: [facility] ENDOSCOPY;  Service: General;  Laterality: N/A;  . IR ANGIO INTRA EXTRACRAN SEL COM CAROTID INNOMINATE UNI L MOD SED  07/04/2020  . IR ANGIO VERTEBRAL SEL VERTEBRAL UNI R MOD SED  07/04/2020  . IR CT HEAD LTD  07/04/2020  . IR CT HEAD LTD  07/04/2020  . IR PERCUTANEOUS ART THROMBECTOMY/INFUSION INTRACRANIAL INC DIAG ANGIO  07/04/2020  . PEG PLACEMENT N/A 07/26/2020   Procedure: PERCUTANEOUS ENDOSCOPIC GASTROSTOMY (PEG) PLACEMENT;  Surgeon: Violeta Gelinas, MD;  Location: [facility] ENDOSCOPY;  Service: General;  Laterality: N/A;  . RADIOLOGY WITH ANESTHESIA N/A 07/04/2020   Procedure:  IR WITH ANESTHESIA;  Surgeon: Radiologist, Medication, MD;  Location: [facility] OR;  Service: Radiology;  Laterality: N/A;  . TEE WITHOUT CARDIOVERSION N/A 07/19/2020   Procedure: TRANSESOPHAGEAL ECHOCARDIOGRAM (TEE);  Surgeon: Little Ishikawa, MD;  Location: [facility] ENDOSCOPY;  Service: Cardiovascular;  Laterality: N/A;  . THROMBECTOMY BRACHIAL ARTERY Right 07/05/2020   Procedure: RIGHT UPPER EXTREMITY THROMBECTOMY;  Surgeon: Cephus Shelling, MD;  Location: [facility] OR;  Service: Vascular;  Laterality: Right;    Family History  Problem Relation Age of Onset  . Diabetes Maternal Grandmother     SOCIAL HISTORY: Social History   Tobacco Use  . Smoking status: Current Every Day Smoker    Packs/day: 0.10    Types: Cigarettes  . Smokeless tobacco: Never Used  Substance Use Topics  . Alcohol use: Yes    Comment: occ.    Allergies  Allergen Reactions  . Norco [Hydrocodone-Acetaminophen] Itching, Nausea And Vomiting and Swelling    Current Outpatient Medications  Medication Sig Dispense Refill  . acetaminophen (TYLENOL) 160 MG/5ML solution Place 20.3 mLs (650 mg total) into feeding tube every 4 (four) hours as needed for mild pain (or temp > 37.5 C (99.5 F)). 120 mL 0  . acetaminophen (TYLENOL) 325 MG tablet Take 2 tablets (650 mg total) by mouth every 4 (four) hours as needed for mild pain (or temp > 37.5 C (99.5 F)).    Marland Kitchen apixaban (ELIQUIS) 5 MG TABS tablet Place 1 tablet (5 mg total) into feeding tube 2 (two) times daily. 60 tablet   . atorvastatin (LIPITOR) 40 MG tablet Place 1  tablet (40 mg total) into feeding tube daily at 6 PM.    . bisacodyl (DULCOLAX) 10 MG suppository Place 1 suppository (10 mg total) rectally daily as needed for moderate constipation. 12 suppository 0  . docusate (COLACE) 50 MG/5ML liquid Place 10 mLs (100 mg total) into feeding tube 2 (two) times daily. 100 mL 0  . gabapentin (NEURONTIN) 250 MG/5ML solution Place 12 mLs (600 mg total) into feeding tube every 8  (eight) hours.  12  . insulin aspart (NOVOLOG) 100 UNIT/ML injection Inject 0-15 Units into the skin every 4 (four) hours. Correction coverage: Moderate (average weight, post-op)  CBG < 70: Implement Hypoglycemia Standing Orders and refer to Hypoglycemia Standing Orders sidebar report  CBG 70 - 120: 0 units  CBG 121 - 150: 2 units  CBG 151 - 200: 3 units  CBG 201 - 250: 5 units  CBG 251 - 300: 8 units  CBG 301 - 350: 11 units  CBG 351 - 400: 15 units  CBG > 400 call MD and obtain STAT lab verification 10 mL 11  . insulin aspart (NOVOLOG) 100 UNIT/ML injection Inject 6 Units into the skin every 4 (four) hours. 10 mL 11  . insulin detemir (LEVEMIR) 100 UNIT/ML injection Inject 0.35 mLs (35 Units total) into the skin 2 (two) times daily. 10 mL 11  . melatonin 3 MG TABS tablet Place 1 tablet (3 mg total) into feeding tube at bedtime.  0  . metoprolol tartrate (LOPRESSOR) 50 MG tablet Place 1 tablet (50 mg total) into feeding tube 2 (two) times daily.    . Multiple Vitamin (MULTIVITAMIN WITH MINERALS) TABS tablet Place 1 tablet into feeding tube daily.    . Nutritional Supplements (FEEDING SUPPLEMENT, GLUCERNA 1.5 CAL,) LIQD Place 1,000 mLs into feeding tube continuous.    . polyethylene glycol (MIRALAX / GLYCOLAX) 17 g packet Place 17 g into feeding tube daily. 14 each 0  . senna-docusate (SENOKOT-S) 8.6-50 MG tablet Place 1 tablet into feeding tube at bedtime as needed for mild constipation.    . sertraline (ZOLOFT) 25 MG tablet Place 1 tablet (25 mg total) into feeding tube daily.    Marland Kitchen valproic acid (DEPAKENE) 250 MG/5ML solution Place 5 mLs (250 mg total) into feeding tube 2 (two) times daily. 600 mL   . Water For Irrigation, Sterile (FREE WATER) SOLN Place 200 mLs into feeding tube every 6 (six) hours.     No current facility-administered medications for this visit.    REVIEW OF SYSTEMS:  [X]  denotes positive finding, [ ]  denotes negative finding Cardiac  Comments:  Chest pain or chest  pressure:    Shortness of breath upon exertion:    Short of breath when lying flat:    Irregular heart rhythm:        Vascular    Pain in calf, thigh, or hip brought on by ambulation:    Pain in feet at night that wakes you up from your sleep:     Blood clot in your veins:    Leg swelling:         Pulmonary    Oxygen at home:    Productive cough:     Wheezing:         Neurologic    Sudden weakness in arms or legs:     Sudden numbness in arms or legs:     Sudden onset of difficulty speaking or slurred speech:    Temporary loss of vision in one eye:  Problems with dizziness:         Gastrointestinal    Blood in stool:     Vomited blood:         Genitourinary    Burning when urinating:     Blood in urine:        Psychiatric    Major depression:         Hematologic    Bleeding problems:    Problems with blood clotting too easily:        Skin    Rashes or ulcers:        Constitutional    Fever or chills:      PHYSICAL EXAM: Vitals:   08/28/20 1609  BP: 109/68  Pulse: 83  Resp: 14  Temp: 98.3 F (36.8 C)  TempSrc: Temporal  SpO2: 92%    GENERAL: The patient is a well-nourished female, in no acute distress. The vital signs are documented above. CARDIAC: There is a regular rate and rhythm.  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  History:   Patient has a history of upper extremity pain.  Risk Factors: Hypertension, Diabetes, current smoker.  Other Factors: 07/05/2020 Thrombectomy of right brachial, radial, and  ulnar         arteries   Performing Technologist: Hardie Lora RVT     Examination Guidelines: A complete evaluation includes B-mode imaging,  spectral  Doppler, color Doppler, and power Doppler as needed of all accessible  portions  of each vessel. Bilateral testing is considered an integral part of a   complete  examination. Limited examinations for reoccurring indications may be  performed  as noted.     Right Doppler Findings:  +---------------+----------+---------+--------+--------+  Site      PSV (cm/s)Waveform StenosisComments  +---------------+----------+---------+--------+--------+  Subclavian Prox82    triphasic          +---------------+----------+---------+--------+--------+  Subclavian Dist     triphasic          +---------------+----------+---------+--------+--------+  Axillary    56    triphasic          +---------------+----------+---------+--------+--------+  Brachial Prox 50    triphasic          +---------------+----------+---------+--------+--------+  Brachial Mid  48    triphasic          +---------------+----------+---------+--------+--------+  Brachial Dist -33    triphasic          +---------------+----------+---------+--------+--------+  Radial Prox  80    triphasic          +---------------+----------+---------+--------+--------+  Radial Mid   42    triphasic          +---------------+----------+---------+--------+--------+  Radial Dist  45    triphasic          +---------------+----------+---------+--------+--------+  Ulnar Prox   20    triphasic          +---------------+----------+---------+--------+--------+  Ulnar Mid   0                    +---------------+----------+---------+--------+--------+  Ulnar Dist   0                    +---------------+----------+---------+--------+--------+   Pulsatile ppg signals are present in the 1,2,3 and 5th digits of the right  hand.        Left Doppler Findings:  +---------------+----------+---------+--------+--------+  Site  PSV  (cm/s)Waveform StenosisComments  +---------------+----------+---------+--------+--------+  Subclavian Prox64    triphasic          +---------------+----------+---------+--------+--------+        Summary:    Left: Obstruction noted in the ulnar artery.    Assessment/Plan:  35 year old female that had a right MCA embolic stroke with and developed right upper extremity ischemia in the hospital that required right brachial, radial, and ulnar artery thrombectomy.  Ultimately she has tissue loss on the right first and second digits.  Duplex today shows triphasic waveform down to the radial artery at the wrist.  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  She does have a palpable radial artery.  I have recommended right upper extremity arteriogram next week.  I will also get her to see hand surgeon to evaluate her fingertips where she has dry gangrene.  I suspect she had embolic events distally into her digits during her ischemic event when she likely embolized.   Cephus Shellinghristopher J. Ambra Haverstick, MD [facility] [place] Office: [phone]

## 2020-09-04 ENCOUNTER — Telehealth: Payer: Self-pay

## 2020-09-04 NOTE — Telephone Encounter (Signed)
Sharnell, nurse from [place] called to let us know pt took Eliquis. Per MD, she can have procedure Thursday as long as she holds Eliquis tonight until after procedure. I have left message at [facility] to call us back to give them this information.

## 2020-09-13 ENCOUNTER — Other Ambulatory Visit: Payer: Self-pay

## 2020-09-13 ENCOUNTER — Ambulatory Visit (HOSPITAL_COMMUNITY)
Admission: RE | Admit: 2020-09-13 | Discharge: 2020-09-13 | Disposition: A | Discharge status: Home / Self Care | Payer: Medicaid Other | Attending: Vascular Surgery | Admitting: Vascular Surgery

## 2020-09-13 ENCOUNTER — Encounter (HOSPITAL_COMMUNITY)
Admission: RE | Disposition: A | Discharge status: Home / Self Care | Payer: Self-pay | Source: Home / Self Care | Attending: Vascular Surgery

## 2020-09-13 DIAGNOSIS — F17210 Nicotine dependence, cigarettes, uncomplicated: Secondary | ICD-10-CM | POA: Insufficient documentation

## 2020-09-13 DIAGNOSIS — M79644 Pain in right finger(s): Secondary | ICD-10-CM | POA: Diagnosis not present

## 2020-09-13 DIAGNOSIS — Z794 Long term (current) use of insulin: Secondary | ICD-10-CM | POA: Diagnosis not present

## 2020-09-13 DIAGNOSIS — Z833 Family history of diabetes mellitus: Secondary | ICD-10-CM | POA: Diagnosis not present

## 2020-09-13 DIAGNOSIS — I70268 Atherosclerosis of native arteries of extremities with gangrene, other extremity: Secondary | ICD-10-CM

## 2020-09-13 DIAGNOSIS — Z79899 Other long term (current) drug therapy: Secondary | ICD-10-CM | POA: Insufficient documentation

## 2020-09-13 DIAGNOSIS — I10 Essential (primary) hypertension: Secondary | ICD-10-CM | POA: Insufficient documentation

## 2020-09-13 DIAGNOSIS — Z885 Allergy status to narcotic agent status: Secondary | ICD-10-CM | POA: Insufficient documentation

## 2020-09-13 DIAGNOSIS — E1152 Type 2 diabetes mellitus with diabetic peripheral angiopathy with gangrene: Secondary | ICD-10-CM | POA: Insufficient documentation

## 2020-09-13 DIAGNOSIS — Z7901 Long term (current) use of anticoagulants: Secondary | ICD-10-CM | POA: Insufficient documentation

## 2020-09-13 HISTORY — PX: PERIPHERAL VASCULAR BALLOON ANGIOPLASTY: CATH118281

## 2020-09-13 HISTORY — PX: UPPER EXTREMITY ANGIOGRAPHY: CATH118270

## 2020-09-13 LAB — POCT I-STAT, CHEM 8
BUN: 11 mg/dL (ref 6–20)
Calcium, Ion: 1.19 mmol/L (ref 1.15–1.40)
Chloride: 99 mmol/L (ref 98–111)
Creatinine, Ser: 0.4 mg/dL — ABNORMAL LOW (ref 0.44–1.00)
Glucose, Bld: 151 mg/dL — ABNORMAL HIGH (ref 70–99)
HCT: 44 % (ref 36.0–46.0)
Hemoglobin: 15 g/dL (ref 12.0–15.0)
Potassium: 4.2 mmol/L (ref 3.5–5.1)
Sodium: 140 mmol/L (ref 135–145)
TCO2: 29 mmol/L (ref 22–32)

## 2020-09-13 LAB — GLUCOSE, CAPILLARY
Glucose-Capillary: 143 mg/dL — ABNORMAL HIGH (ref 70–99)
Glucose-Capillary: 179 mg/dL — ABNORMAL HIGH (ref 70–99)

## 2020-09-13 SURGERY — UPPER EXTREMITY ANGIOGRAPHY
Anesthesia: LOCAL

## 2020-09-13 MED ORDER — ACETAMINOPHEN 325 MG PO TABS: 650.0000 mg | ORAL_TABLET | ORAL | Status: DC | PRN

## 2020-09-13 MED ORDER — HEPARIN (PORCINE) IN NACL 1000-0.9 UT/500ML-% IV SOLN
INTRAVENOUS | Status: DC | PRN
  Administered 2020-09-13 (×2): 500 mL

## 2020-09-13 MED ORDER — HEPARIN SODIUM (PORCINE) 1000 UNIT/ML IJ SOLN
INTRAMUSCULAR | Status: DC | PRN
  Administered 2020-09-13: 2000 [IU] via INTRAVENOUS
  Administered 2020-09-13: 5000 [IU] via INTRAVENOUS

## 2020-09-13 MED ORDER — LABETALOL HCL 5 MG/ML IV SOLN: 10.0000 mg | INTRAVENOUS | Status: DC | PRN

## 2020-09-13 MED ORDER — CLOPIDOGREL BISULFATE 300 MG PO TABS
ORAL_TABLET | ORAL | Status: DC | PRN
  Administered 2020-09-13: 300 mg via ORAL

## 2020-09-13 MED ORDER — HYDRALAZINE HCL 20 MG/ML IJ SOLN: 5.0000 mg | INTRAMUSCULAR | Status: DC | PRN

## 2020-09-13 MED ORDER — SODIUM CHLORIDE 0.9 % IV SOLN: 250.0000 mL | INTRAVENOUS | Status: DC | PRN

## 2020-09-13 MED ORDER — CLOPIDOGREL BISULFATE 75 MG PO TABS: 75.0000 mg | ORAL_TABLET | Freq: Every day | ORAL | Status: DC

## 2020-09-13 MED ORDER — NITROGLYCERIN 1 MG/10 ML FOR IR/CATH LAB
INTRA_ARTERIAL | Status: AC
  Filled 2020-09-13: qty 10

## 2020-09-13 MED ORDER — SODIUM CHLORIDE 0.9 % WEIGHT BASED INFUSION: 1.0000 mL/kg/h | INTRAVENOUS | Status: DC

## 2020-09-13 MED ORDER — LIDOCAINE HCL (PF) 1 % IJ SOLN
INTRAMUSCULAR | Status: DC | PRN
  Administered 2020-09-13: 15 mL via INTRADERMAL

## 2020-09-13 MED ORDER — HEPARIN (PORCINE) IN NACL 1000-0.9 UT/500ML-% IV SOLN
INTRAVENOUS | Status: AC
  Filled 2020-09-13: qty 1000

## 2020-09-13 MED ORDER — CLOPIDOGREL BISULFATE 75 MG PO TABS: 75.0000 mg | ORAL_TABLET | Freq: Every day | ORAL | 11 refills | Status: DC

## 2020-09-13 MED ORDER — SODIUM CHLORIDE 0.9% FLUSH: 3.0000 mL | Freq: Two times a day (BID) | INTRAVENOUS | Status: DC

## 2020-09-13 MED ORDER — ONDANSETRON HCL 4 MG/2ML IJ SOLN: 4.0000 mg | Freq: Four times a day (QID) | INTRAMUSCULAR | Status: DC | PRN

## 2020-09-13 MED ORDER — SODIUM CHLORIDE 0.9 % IV SOLN: INTRAVENOUS | Status: DC

## 2020-09-13 MED ORDER — HEPARIN SODIUM (PORCINE) 1000 UNIT/ML IJ SOLN
INTRAMUSCULAR | Status: AC
  Filled 2020-09-13: qty 1

## 2020-09-13 MED ORDER — LIDOCAINE HCL (PF) 1 % IJ SOLN
INTRAMUSCULAR | Status: AC
  Filled 2020-09-13: qty 30

## 2020-09-13 MED ORDER — SODIUM CHLORIDE 0.9% FLUSH: 3.0000 mL | INTRAVENOUS | Status: DC | PRN

## 2020-09-13 MED ORDER — FENTANYL CITRATE (PF) 100 MCG/2ML IJ SOLN
INTRAMUSCULAR | Status: DC | PRN
  Administered 2020-09-13: 50 ug via INTRAVENOUS

## 2020-09-13 MED ORDER — IODIXANOL 320 MG/ML IV SOLN
INTRAVENOUS | Status: DC | PRN
  Administered 2020-09-13: 95 mL

## 2020-09-13 MED ORDER — CLOPIDOGREL BISULFATE 75 MG PO TABS: 300.0000 mg | ORAL_TABLET | Freq: Once | ORAL | Status: DC

## 2020-09-13 MED ORDER — FENTANYL CITRATE (PF) 100 MCG/2ML IJ SOLN
INTRAMUSCULAR | Status: AC
  Filled 2020-09-13: qty 2

## 2020-09-13 MED ORDER — CLOPIDOGREL BISULFATE 300 MG PO TABS
ORAL_TABLET | ORAL | Status: AC
  Filled 2020-09-13: qty 1

## 2020-09-13 MED ORDER — NITROGLYCERIN 1 MG/10 ML FOR IR/CATH LAB
INTRA_ARTERIAL | Status: DC | PRN
  Administered 2020-09-13 (×2): 200 ug via INTRA_ARTERIAL

## 2020-09-13 SURGICAL SUPPLY — 20 items
BALL STERLING OTW 2.5X100X150 (BALLOONS) ×1
BALLN STERLING OTW 2.5X100X150 (BALLOONS) ×2
BALLN STERLING OTW 2X220X150 (BALLOONS) ×3
BALLOON STERLING OTW 2X220X150 (BALLOONS) ×2 IMPLANT
BALLOON STRLNG OTW 2.5X100X150 (BALLOONS) ×2 IMPLANT
CATH ANGIO 5F BER2 100CM (CATHETERS) ×3 IMPLANT
CATH ANGIO 5F PIGTAIL 100CM (CATHETERS) ×3 IMPLANT
DEVICE VASC CLSR CELT ART 6 (Vascular Products) ×3 IMPLANT
KIT ENCORE 26 ADVANTAGE (KITS) ×3 IMPLANT
KIT MICROPUNCTURE NIT STIFF (SHEATH) ×3 IMPLANT
KIT PV (KITS) ×3 IMPLANT
SHEATH PINNACLE 5F 10CM (SHEATH) ×3 IMPLANT
SHEATH PINNACLE 6F 10CM (SHEATH) ×3 IMPLANT
SHEATH PROBE COVER 6X72 (BAG) ×3 IMPLANT
SHEATH SHUTTLE SELECT 6F (SHEATH) ×3 IMPLANT
SYR MEDRAD MARK V 150ML (SYRINGE) ×3 IMPLANT
TRANSDUCER W/STOPCOCK (MISCELLANEOUS) ×3 IMPLANT
TRAY PV CATH (CUSTOM PROCEDURE TRAY) ×3 IMPLANT
WIRE BENTSON .035X145CM (WIRE) ×3 IMPLANT
WIRE G V18X300CM (WIRE) ×3 IMPLANT

## 2020-09-13 NOTE — Progress Notes (Signed)
I have called [facility] x 3 to give report, I have given [facility] phone number to return call when I spoke to the operator.She assured me she would personally give the number to the nurse. I have highlighted the instructions and will send Dr orders with the transporter/aid and patient

## 2020-09-13 NOTE — Op Note (Signed)
Patient name: ALLIEN Myers MRN: [id number] DOB: Mar 08, 1986 Sex: female  09/13/2020 Pre-operative Diagnosis: Right upper extremity tissue loss in the setting of previous right brachial, radial, and ulnar thromboembolectomy Post-operative diagnosis:  Same Surgeon:  Cephus Shelling, MD Procedure Performed: 1.  Ultrasound-guided access of right common femoral artery 2.  Limited arch aortogram 3.  Right upper extremity arteriogram with selection of third order branches 4.  Right radial artery angioplasty (2 mm Sterling and 2.5 mm Sterling) 5.  Celt closure of the right common femoral artery  Indications: Patient is a 35 year old female who previously underwent a right upper extremity thrombectomy through brachial artery cutdown on 07/05/2020 for an ischemic right arm during admission for embolic stroke.  On follow-up she has been noted to have worsening tissue loss in the right hand.  She presents today for right upper extremity arteriogram after risk benefits discussed.  Findings:   Arch aortogram shows a type I arch with a widely patent innominate as well as right subclavian artery and right axillary artery with no proximal stenosis or source of embolic disease.  Right upper extremity arteriogram showed widely patent right brachial artery with dominant runoff in the right forearm through the interosseous filling the radial at the wrist through a collateral.  The radial then filled retrograde and appeared subtotally occluded near the ostium.  The ulnar artery is occluded.    Ultimately the right radial artery was cannulated and the entire radial artery from the ostium down to the wrist was angioplastied with a 2 and then 2.5 mm Sterling.  She now has inline flow down the right radial with filling of the deep palmar arch in the hand.   Procedure:  The patient was identified in the holding area and taken to room 8.  The patient was then placed supine on the table and prepped and draped in  the usual sterile fashion.  A time out was called.  Ultrasound was used to evaluate the right common femoral artery.  It was patent .  A digital ultrasound image was acquired.  A micropuncture needle was used to access the right common femoral artery under ultrasound guidance.  An 018 wire was advanced without resistance and a micropuncture sheath was placed.  The 018 wire was removed and a benson wire was placed.  The micropuncture sheath was exchanged for a 5 french sheath.  A pigtail catheter was advanced into the ascending thoracic aorta.  She was given 5000 units of IV heparin and an arch aortogram was obtained.  This showed no evidence of proximal disease in the innominate or right subclavian or axillary artery.  I then used the BER 2 catheter to cannulate the innominate and got my Bentson wire out into the right subclavian where we advanced the catheter.  We then got right upper extremity imaging pertinent findings noted above.  After evaluating images it appeared she only had the interosseous as the dominant flow in the right forearm filling the radial at the wrist that was then filling the radial retrograde.  Appeared to have a subtotal occlusion at the ostium.  I elected to try and intervene on the radial artery to give her better inline flow into the hand.  I then used a Rosen wire and put a long 6 [place] shuttle sheath in the right groin and placed all the way into the right brachial artery.  She was given additional 2000 units of IV heparin.  Then used the angioplasty balloon with a  V 18 wire to cannulate her right radial and got a wire all the way down into the palmar arch.  Confirmed on hand-injection was in the true lumen.  The entire radial artery from the ostium to the wrist was then angioplastied with a 2 mm Sterling nominal pressure for 2 minutes.  We gave nitro to prevent vasospasm.  I then elected to use a larger 2.5 mm balloon at the ostium and in the proximal forearm.  Excellent results with  filling of the radial artery and no residual stenosis.  We exchanged for a short 6 French sheath in the right groin and the wires and catheters were removed.  A Celt closure device was deployed in the right groin.   Plan: Patient now has very brisk right radial pulse at the wrist that is palpable.  She has previously been referred to hand surgery and I will follow-up with my office to ensure that referral has happened.  Will arrange follow-up in my office in 1 month.  We will start her on Plavix in addition to her Eliquis.  Cephus Shelling, MD [facility] [place] [place] Office: [phone]

## 2020-09-13 NOTE — Progress Notes (Signed)
Called and spoke with RN responsible for pt at [facility] in [place]. Facility RN informed me that pt needs a sitter for safety. There is a facility staff member in the room with the patient; however, this staff member is not a caregiver for the pt. We are trying to acquire a staff sitter and will move the pt closer to the nurses' station. Judeth Horn, RN aware of the situation.

## 2020-09-13 NOTE — H&P (Signed)
History and Physical Interval Note:  09/13/2020 11:22 AM  Brandi Myers  has presented today for surgery, with the diagnosis of pad.  The various methods of treatment have been discussed with the patient and family. After consideration of risks, benefits and other options for treatment, the patient has consented to  Procedure(s): UPPER EXTREMITY ANGIOGRAPHY - Right (N/A) as a surgical intervention.  The patient's history has been reviewed, patient examined, no change in status, stable for surgery.  I have reviewed the patient's chart and labs.  Questions were answered to the patient's satisfaction.    Right upper extremity arteriogram for tissue loss.  Brandi Myers  Patient name: Brandi Myers            MRN: [id number]        DOB: Aug 14, 1985        Sex: female  REASON FOR VISIT: Hospital follow-up after right upper extremity thrombectomy  HPI: Brandi Myers is a 35 y.o. female that presents for hospital follow-up after right upper extremity thrombectomy.  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  She then had cerebral edema requiring craniectomy.  Ultimately I performed right upper extremity thrombectomy through brachial cutdown on 07/05/20.  She now has  tissue loss on the right first and second fingers that she states is painful.  Remains on Eliquis.      Past Medical History:  Diagnosis Date  . Diabetes mellitus without complication (HCC)   . Hypertension          Past Surgical History:  Procedure Laterality Date  . BUBBLE STUDY  07/19/2020   Procedure: BUBBLE STUDY;  Surgeon: Little Ishikawa, MD;  Location: [facility] ENDOSCOPY;  Service: Cardiovascular;;  . CESAREAN SECTION    . CHOLECYSTECTOMY    . CRANIOTOMY Right 07/06/2020   Procedure: RIGHT CRANIECTOMY WITH PLACEMENT OF BONE FLAP IN ABDOMEN;  Surgeon: Maeola Harman, MD;  Location: [facility] OR;  Service: Neurosurgery;  Laterality: Right;  .  ESOPHAGOGASTRODUODENOSCOPY (EGD) WITH PROPOFOL N/A 07/26/2020   Procedure: ESOPHAGOGASTRODUODENOSCOPY (EGD) WITH PROPOFOL;  Surgeon: Violeta Gelinas, MD;  Location: [facility] ENDOSCOPY;  Service: General;  Laterality: N/A;  . IR ANGIO INTRA EXTRACRAN SEL COM CAROTID INNOMINATE UNI L MOD SED  07/04/2020  . IR ANGIO VERTEBRAL SEL VERTEBRAL UNI R MOD SED  07/04/2020  . IR CT HEAD LTD  07/04/2020  . IR CT HEAD LTD  07/04/2020  . IR PERCUTANEOUS ART THROMBECTOMY/INFUSION INTRACRANIAL INC DIAG ANGIO  07/04/2020  . PEG PLACEMENT N/A 07/26/2020   Procedure: PERCUTANEOUS ENDOSCOPIC GASTROSTOMY (PEG) PLACEMENT;  Surgeon: Violeta Gelinas, MD;  Location: [facility] ENDOSCOPY;  Service: General;  Laterality: N/A;  . RADIOLOGY WITH ANESTHESIA N/A 07/04/2020   Procedure: IR WITH ANESTHESIA;  Surgeon: Radiologist, Medication, MD;  Location: [facility] OR;  Service: Radiology;  Laterality: N/A;  . TEE WITHOUT CARDIOVERSION N/A 07/19/2020   Procedure: TRANSESOPHAGEAL ECHOCARDIOGRAM (TEE);  Surgeon: Little Ishikawa, MD;  Location: [facility] ENDOSCOPY;  Service: Cardiovascular;  Laterality: N/A;  . THROMBECTOMY BRACHIAL ARTERY Right 07/05/2020   Procedure: RIGHT UPPER EXTREMITY THROMBECTOMY;  Surgeon: Brandi Shelling, MD;  Location: [facility] OR;  Service: Vascular;  Laterality: Right;         Family History  Problem Relation Age of Onset  . Diabetes Maternal Grandmother     SOCIAL HISTORY: Social History        Tobacco Use  . Smoking status: Current Every Day Smoker  Packs/day: 0.10    Types: Cigarettes  . Smokeless tobacco: Never Used  Substance Use Topics  . Alcohol use: Yes    Comment: occ.        Allergies  Allergen Reactions  . Norco [Hydrocodone-Acetaminophen] Itching, Nausea And Vomiting and Swelling          Current Outpatient Medications  Medication Sig Dispense Refill  . acetaminophen (TYLENOL) 160 MG/5ML solution Place 20.3 mLs (650 mg total) into feeding tube every 4 (four) hours as  needed for mild pain (or temp > 37.5 C (99.5 F)). 120 mL 0  . acetaminophen (TYLENOL) 325 MG tablet Take 2 tablets (650 mg total) by mouth every 4 (four) hours as needed for mild pain (or temp > 37.5 C (99.5 F)).    Marland Kitchen apixaban (ELIQUIS) 5 MG TABS tablet Place 1 tablet (5 mg total) into feeding tube 2 (two) times daily. 60 tablet   . atorvastatin (LIPITOR) 40 MG tablet Place 1 tablet (40 mg total) into feeding tube daily at 6 PM.    . bisacodyl (DULCOLAX) 10 MG suppository Place 1 suppository (10 mg total) rectally daily as needed for moderate constipation. 12 suppository 0  . docusate (COLACE) 50 MG/5ML liquid Place 10 mLs (100 mg total) into feeding tube 2 (two) times daily. 100 mL 0  . gabapentin (NEURONTIN) 250 MG/5ML solution Place 12 mLs (600 mg total) into feeding tube every 8 (eight) hours.  12  . insulin aspart (NOVOLOG) 100 UNIT/ML injection Inject 0-15 Units into the skin every 4 (four) hours. Correction coverage: Moderate (average weight, post-op)       CBG < 70:   Implement Hypoglycemia Standing Orders and refer to Hypoglycemia Standing Orders sidebar report          CBG 70 - 120:         0 units     CBG 121 - 150:       2 units     CBG 151 - 200:       3 units     CBG 201 - 250:       5 units     CBG 251 - 300:       8 units     CBG 301 - 350:       11 units     CBG 351 - 400:       15 units     CBG > 400  call MD and obtain STAT lab verification 10 mL 11  . insulin aspart (NOVOLOG) 100 UNIT/ML injection Inject 6 Units into the skin every 4 (four) hours. 10 mL 11  . insulin detemir (LEVEMIR) 100 UNIT/ML injection Inject 0.35 mLs (35 Units total) into the skin 2 (two) times daily. 10 mL 11  . melatonin 3 MG TABS tablet Place 1 tablet (3 mg total) into feeding tube at bedtime.  0  . metoprolol tartrate (LOPRESSOR) 50 MG tablet Place 1 tablet (50 mg total) into feeding tube 2 (two) times daily.    . Multiple Vitamin (MULTIVITAMIN WITH MINERALS) TABS tablet Place 1 tablet into  feeding tube daily.    . Nutritional Supplements (FEEDING SUPPLEMENT, GLUCERNA 1.5 CAL,) LIQD Place 1,000 mLs into feeding tube continuous.    . polyethylene glycol (MIRALAX / GLYCOLAX) 17 g packet Place 17 g into feeding tube daily. 14 each 0  . senna-docusate (SENOKOT-S) 8.6-50 MG tablet Place 1 tablet into feeding tube at bedtime as needed for mild constipation.    Marland Kitchen  sertraline (ZOLOFT) 25 MG tablet Place 1 tablet (25 mg total) into feeding tube daily.    Marland Kitchen. valproic acid (DEPAKENE) 250 MG/5ML solution Place 5 mLs (250 mg total) into feeding tube 2 (two) times daily. 600 mL   . Water For Irrigation, Sterile (FREE WATER) SOLN Place 200 mLs into feeding tube every 6 (six) hours.     No current facility-administered medications for this visit.    REVIEW OF SYSTEMS:  [X]  denotes positive finding, [ ]  denotes negative finding Cardiac  Comments:  Chest pain or chest pressure:    Shortness of breath upon exertion:    Short of breath when lying flat:    Irregular heart rhythm:        Vascular    Pain in calf, thigh, or hip brought on by ambulation:    Pain in feet at night that wakes you up from your sleep:     Blood clot in your veins:    Leg swelling:         Pulmonary    Oxygen at home:    Productive cough:     Wheezing:         Neurologic    Sudden weakness in arms or legs:     Sudden numbness in arms or legs:     Sudden onset of difficulty speaking or slurred speech:    Temporary loss of vision in one eye:     Problems with dizziness:         Gastrointestinal    Blood in stool:     Vomited blood:         Genitourinary    Burning when urinating:     Blood in urine:        Psychiatric    Major depression:         Hematologic    Bleeding problems:    Problems with blood clotting too easily:        Skin    Rashes or ulcers:        Constitutional    Fever or  chills:      PHYSICAL EXAM:    Vitals:   08/28/20 1609  BP: 109/68  Pulse: 83  Resp: 14  Temp: 98.3 F (36.8 C)  TempSrc: Temporal  SpO2: 92%    GENERAL: The patient is a well-nourished female, in no acute distress. The vital signs are documented above. CARDIAC: There is a regular rate and rhythm.  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  History:   Patient has a history of upper extremity pain.  Risk Factors: Hypertension, Diabetes, current smoker.  Other Factors: 07/05/2020 Thrombectomy of right brachial, radial, and  ulnar         arteries   Performing Technologist: Hardie LoraMatthew Cravey RVT     Examination Guidelines: A complete evaluation includes B-mode imaging,  spectral  Doppler, color Doppler, and power Doppler as needed of all accessible  portions  of each vessel. Bilateral testing is considered an integral part of a  complete  examination. Limited examinations for reoccurring indications may be  performed  as noted.     Right Doppler Findings:  +---------------+----------+---------+--------+--------+  Site      PSV (cm/s)Waveform StenosisComments  +---------------+----------+---------+--------+--------+  Subclavian Prox82    triphasic          +---------------+----------+---------+--------+--------+  Subclavian Dist  triphasic          +---------------+----------+---------+--------+--------+  Axillary    56    triphasic          +---------------+----------+---------+--------+--------+  Brachial Prox 50    triphasic          +---------------+----------+---------+--------+--------+  Brachial Mid  48    triphasic          +---------------+----------+---------+--------+--------+  Brachial Dist -33     triphasic          +---------------+----------+---------+--------+--------+  Radial Prox  80    triphasic          +---------------+----------+---------+--------+--------+  Radial Mid   42    triphasic          +---------------+----------+---------+--------+--------+  Radial Dist  45    triphasic          +---------------+----------+---------+--------+--------+  Ulnar Prox   20    triphasic          +---------------+----------+---------+--------+--------+  Ulnar Mid   0                    +---------------+----------+---------+--------+--------+  Ulnar Dist   0                    +---------------+----------+---------+--------+--------+   Pulsatile ppg signals are present in the 1,2,3 and 5th digits of the right  hand.        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    Assessment/Plan:  35 year old female that had a right MCA embolic stroke with and developed right upper extremity ischemia in the hospital that required right brachial, radial, and ulnar artery thrombectomy.  Ultimately she has tissue loss on the right first and second digits.  Duplex today shows triphasic waveform down to the radial artery at the wrist.  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  She does have a palpable radial artery.  I have recommended right upper extremity arteriogram next week.  I will also get her to see hand surgeon to evaluate her  fingertips where she has dry gangrene.  I suspect she had embolic events distally into her digits during her ischemic event when she likely embolized.   Brandi Shelling, MD [facility] Office: [phone]

## 2020-09-13 NOTE — Discharge Instructions (Signed)
We performed balloon angioplasty of her right radial artery to improve flow to the hand given her wounds.  I will ensure that she has referral to hand surgery.  Will arrange follow-up in my office in 1 month.  She will have a new prescription for Plavix 75 mg.  She can restart her other blood thinner postop day 1.   Angiogram, Care After This sheet gives you information about how to care for yourself after your procedure. Your health care provider may also give you more specific instructions. If you have problems or questions, contact your health care provider. What can I expect after the procedure? After the procedure, it is common to have:  Bruising and tenderness at the catheter insertion area.  A collection of blood (hematoma) at the insertion area. This may feel like a small lump under the skin at the insertion site. Follow these instructions at home: Insertion site care  Follow instructions from your health care provider about how to take care of your insertion site. Make sure you: ? Wash your hands with soap and water before and after you change your bandage (dressing). If soap and water are not available, use hand sanitizer. ? Change your dressing as told by your health care provider.  Do not take baths, swim, or use a hot tub until your health care provider approves.  You may shower 24-48 hours after the procedure, or as told by your health care provider. To clean the insertion site: ? Gently wash the area with plain soap and water. ? Pat the area dry with a clean towel. ? Do not rub the site. This may cause bleeding.  Check your insertion site every day for signs of infection. Check for: ? Redness, swelling, or pain. ? Fluid or blood. ? Warmth. ? Pus or a bad smell.  Do not apply powder or lotion to the site. Keep the site clean and dry.   Activity  Do not drive for 24 hours if you were given a sedative during your procedure.  Rest as told by your health care provider,  usually for 1-2 days.  Do not lift anything that is heavier than 10 lb (4.5 kg), or the limit that you are told, until your health care provider says that it is safe.  If the insertion site was in your leg, try to avoid stairs for a few days.  Return to your normal activities as told by your health care provider, usually in about a week. Ask your health care provider what activities are safe for you. General instructions  If your insertion site starts bleeding, lie flat and put pressure on the site. If the bleeding does not stop, get help right away. This is a medical emergency.  Take over-the-counter and prescription medicines only as told by your health care provider.  Drink enough fluid to keep your urine pale yellow. This helps flush the contrast dye from your body.  Keep all follow-up visits as told by your health care provider. This is important.   Contact a health care provider if:  You have a fever or chills.  You have redness, swelling, or pain around your insertion site.  You have fluid or blood coming from your insertion site.  Your insertion site feels warm to the touch.  You have pus or a bad smell coming from your insertion site.  You have more bruising around the insertion site. Get help right away if you have:  A problem with the insertion area,  such as: ? The area swells fast or bleeds even after you apply pressure. ? The area becomes pale, cool, tingly, or numb.  Chest pain.  Trouble breathing.  A rash.  Any symptoms of a stroke. "BE FAST" is an easy way to remember the main warning signs of a stroke: ? B - Balance. Signs are dizziness, sudden trouble walking, or loss of balance. ? E - Eyes. Signs are trouble seeing or a sudden change in vision. ? F - Face. Signs are sudden weakness or loss of feeling of the face, or the face or eyelid drooping on one side. ? A - Arms. Signs are weakness or loss of feeling in an arm. This happens suddenly and usually on  one side of the body. ? S - Speech. Signs are sudden trouble speaking, slurred speech, or trouble understanding what people say. ? T - Time. Time to call emergency services. Write down what time symptoms started.  You have other signs of a stroke, such as: ? A sudden, severe headache with no known cause. ? Nausea or vomiting. ? Seizure. These symptoms may represent a serious problem that is an emergency. Do not wait to see if the symptoms will go away. Get medical help right away. Call your local emergency services (911 in the [place] Do not drive yourself to the hospital. Summary  It is common to have bruising and tenderness at the catheter insertion area.  Do not take baths, swim, or use a hot tub until your health care provider approves. You may shower 24-48 hours after the procedure or as told.  It is important to rest and drink plenty of fluids.  If the insertion site bleeds, lie flat and put pressure on the site. If the bleeding continues, get help right away. This is a medical emergency. This information is not intended to replace advice given to you by your health care provider. Make sure you discuss any questions you have with your health care provider. Document Revised: 02/02/2019 Document Reviewed: 02/02/2019 Elsevier Patient Education  2021 [employer].

## 2020-09-14 ENCOUNTER — Encounter (HOSPITAL_COMMUNITY): Payer: Self-pay | Admitting: Vascular Surgery

## 2020-09-15 ENCOUNTER — Emergency Department (HOSPITAL_BASED_OUTPATIENT_CLINIC_OR_DEPARTMENT_OTHER)
Admission: EM | Admit: 2020-09-15 | Discharge: 2020-09-15 | Disposition: A | Discharge status: Home / Self Care | Payer: Medicaid Other | Attending: Emergency Medicine | Admitting: Emergency Medicine

## 2020-09-15 ENCOUNTER — Other Ambulatory Visit: Payer: Self-pay

## 2020-09-15 ENCOUNTER — Emergency Department (HOSPITAL_BASED_OUTPATIENT_CLINIC_OR_DEPARTMENT_OTHER): Payer: Medicaid Other

## 2020-09-15 ENCOUNTER — Encounter (HOSPITAL_BASED_OUTPATIENT_CLINIC_OR_DEPARTMENT_OTHER): Payer: Self-pay | Admitting: Emergency Medicine

## 2020-09-15 DIAGNOSIS — S0990XA Unspecified injury of head, initial encounter: Secondary | ICD-10-CM | POA: Diagnosis present

## 2020-09-15 DIAGNOSIS — G8194 Hemiplegia, unspecified affecting left nondominant side: Secondary | ICD-10-CM | POA: Diagnosis not present

## 2020-09-15 DIAGNOSIS — Z7901 Long term (current) use of anticoagulants: Secondary | ICD-10-CM | POA: Insufficient documentation

## 2020-09-15 DIAGNOSIS — Z79899 Other long term (current) drug therapy: Secondary | ICD-10-CM | POA: Insufficient documentation

## 2020-09-15 DIAGNOSIS — Z7902 Long term (current) use of antithrombotics/antiplatelets: Secondary | ICD-10-CM | POA: Insufficient documentation

## 2020-09-15 DIAGNOSIS — F17210 Nicotine dependence, cigarettes, uncomplicated: Secondary | ICD-10-CM | POA: Insufficient documentation

## 2020-09-15 DIAGNOSIS — I10 Essential (primary) hypertension: Secondary | ICD-10-CM | POA: Insufficient documentation

## 2020-09-15 DIAGNOSIS — E119 Type 2 diabetes mellitus without complications: Secondary | ICD-10-CM | POA: Diagnosis not present

## 2020-09-15 DIAGNOSIS — R278 Other lack of coordination: Secondary | ICD-10-CM | POA: Diagnosis not present

## 2020-09-15 DIAGNOSIS — F446 Conversion disorder with sensory symptom or deficit: Secondary | ICD-10-CM | POA: Diagnosis not present

## 2020-09-15 DIAGNOSIS — W1809XA Striking against other object with subsequent fall, initial encounter: Secondary | ICD-10-CM | POA: Diagnosis not present

## 2020-09-15 DIAGNOSIS — Z794 Long term (current) use of insulin: Secondary | ICD-10-CM | POA: Insufficient documentation

## 2020-09-15 NOTE — ED Notes (Signed)
Changed pt's brief after she voided.

## 2020-09-15 NOTE — ED Triage Notes (Signed)
Pt to ED via [facility] from [facility] w/ c/o fall, hitting head. Hx of brain surg and wants to make sure everything is OK; denies pain.

## 2020-09-15 NOTE — Discharge Instructions (Addendum)
Head CT did not show any signs of acute injury.  Continue your current medications

## 2020-09-15 NOTE — ED Notes (Signed)
ED Provider at bedside. 

## 2020-09-15 NOTE — ED Provider Notes (Signed)
[facility] EMERGENCY DEPARTMENT Provider Note   CSN: [id number] Arrival date & time: 09/15/20  1828     History Chief Complaint  Patient presents with  . Head Injury  . Fall    Brandi Myers is a 35 y.o. female.  HPI   Patient presents to the ED for evaluation of a head injury.   Patient has a very complicated medical history.  She has history of cerebral aneurysm and hemorrhage.  She has had a craniectomy.  Patient's had part of her skull removed.  Patient has been told to be very careful with any head injury.  She states she hit her head on the bed today after falling.  She is post to wear a helmet but she was not wearing 1.  She denies any severe pain right now but wants to make sure things are okay.  She denies any vomiting.  No neck pain.  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 Patient also was recently in the hospital for thrombectomy of her right upper extremity Past Medical History:  Diagnosis Date  . Diabetes mellitus without complication (HCC)   . Hypertension     Patient Active Problem List   Diagnosis Date Noted  . Fever   . Acute occlusion of artery of upper extremity due to thrombus (HCC)   . Dysphagia, pharyngoesophageal phase   . 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 Benign essential hypertension 07/28/2020  . Thrombocytosis   . Leukocytosis   . Arterial thrombosis (HCC)   . 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 Cellulitis 02/26/2016    Past Surgical History:  Procedure Laterality Date  . BUBBLE STUDY  07/19/2020   Procedure: BUBBLE STUDY;  Surgeon: Little Ishikawa, MD;  Location: [facility] ENDOSCOPY;  Service: Cardiovascular;;  . CESAREAN SECTION    . CHOLECYSTECTOMY    . CRANIOTOMY Right 07/06/2020   Procedure: RIGHT CRANIECTOMY WITH  PLACEMENT OF BONE FLAP IN ABDOMEN;  Surgeon: Maeola Harman, MD;  Location: [facility] OR;  Service: Neurosurgery;  Laterality: Right;  . ESOPHAGOGASTRODUODENOSCOPY (EGD) WITH PROPOFOL N/A 07/26/2020   Procedure: ESOPHAGOGASTRODUODENOSCOPY (EGD) WITH PROPOFOL;  Surgeon: Violeta Gelinas, MD;  Location: [facility] ENDOSCOPY;  Service: General;  Laterality: N/A;  . IR ANGIO INTRA EXTRACRAN SEL COM CAROTID INNOMINATE UNI L MOD SED  07/04/2020  . IR ANGIO VERTEBRAL SEL VERTEBRAL UNI R MOD SED  07/04/2020  . IR CT HEAD LTD  07/04/2020  . IR CT HEAD LTD  07/04/2020  . IR PERCUTANEOUS ART THROMBECTOMY/INFUSION INTRACRANIAL INC DIAG ANGIO  07/04/2020  . PEG PLACEMENT N/A 07/26/2020   Procedure: PERCUTANEOUS ENDOSCOPIC GASTROSTOMY (PEG) PLACEMENT;  Surgeon: Violeta Gelinas, MD;  Location: [facility] ENDOSCOPY;  Service: General;  Laterality: N/A;  . PERIPHERAL VASCULAR BALLOON ANGIOPLASTY  09/13/2020   Procedure: PERIPHERAL VASCULAR BALLOON ANGIOPLASTY;  Surgeon: Cephus Shelling, MD;  Location: [facility] INVASIVE CV LAB;  Service: Cardiovascular;;  Radial   . RADIOLOGY WITH ANESTHESIA N/A 07/04/2020   Procedure: IR WITH ANESTHESIA;  Surgeon: Radiologist, Medication, MD;  Location: [facility] OR;  Service: Radiology;  Laterality: N/A;  . TEE WITHOUT CARDIOVERSION N/A 07/19/2020   Procedure: TRANSESOPHAGEAL ECHOCARDIOGRAM (TEE);  Surgeon: Little Ishikawa, MD;  Location: [facility] ENDOSCOPY;  Service: Cardiovascular;  Laterality: N/A;  . THROMBECTOMY BRACHIAL ARTERY Right 07/05/2020   Procedure: RIGHT UPPER EXTREMITY THROMBECTOMY;  Surgeon:  Cephus Shelling, MD;  Location: [facility] OR;  Service: Vascular;  Laterality: Right;  . UPPER EXTREMITY ANGIOGRAPHY N/A 09/13/2020   Procedure: UPPER EXTREMITY ANGIOGRAPHY - Right;  Surgeon: Cephus Shelling, MD;  Location: [facility] INVASIVE CV LAB;  Service: Cardiovascular;  Laterality: N/A;     OB History   No obstetric history on file.     Family History  Problem Relation Age of Onset  . Diabetes Maternal  Grandmother     Social History   Tobacco Use  . Smoking status: Current Every Day Smoker    Packs/day: 0.10    Types: Cigarettes  . Smokeless tobacco: Never Used  Substance Use Topics  . Alcohol use: Not Currently  . Drug use: No    Home Medications Prior to Admission medications   Medication Sig Start Date End Date Taking? Authorizing Provider  acetaminophen (TYLENOL) 325 MG tablet Take 2 tablets (650 mg total) by mouth every 4 (four) hours as needed for mild pain (or temp > 37.5 C (99.5 F)). 08/03/20   Russella Dar, NP  apixaban (ELIQUIS) 5 MG TABS tablet Place 1 tablet (5 mg total) into feeding tube 2 (two) times daily. Patient taking differently: Place 5 mg into feeding tube 2 (two) times daily. (0800 & 2000) 08/03/20   Russella Dar, NP  atorvastatin (LIPITOR) 40 MG tablet Place 1 tablet (40 mg total) into feeding tube daily at 6 PM. 08/03/20   Russella Dar, NP  bisacodyl (DULCOLAX) 10 MG suppository Place 1 suppository (10 mg total) rectally daily as needed for moderate constipation. 08/03/20   Russella Dar, NP  clopidogrel (PLAVIX) 75 MG tablet Take 1 tablet (75 mg total) by mouth daily. 09/13/20 09/13/21  Cephus Shelling, MD  docusate (COLACE) 50 MG/5ML liquid Place 10 mLs (100 mg total) into feeding tube 2 (two) times daily. Patient taking differently: Place 100 mg into feeding tube 2 (two) times daily. (0800 & 2000) 08/03/20   Russella Dar, NP  gabapentin (NEURONTIN) 250 MG/5ML solution Place 12 mLs (600 mg total) into feeding tube every 8 (eight) hours. Patient taking differently: Place 600 mg into feeding tube every 8 (eight) hours. (0000, 0800 & 1600) 08/03/20   Russella Dar, NP  insulin aspart (NOVOLOG) 100 UNIT/ML injection Inject 0-15 Units into the skin every 4 (four) hours. Correction coverage: Moderate (average weight, post-op)  CBG < 70: Implement Hypoglycemia Standing Orders and refer to Hypoglycemia Standing Orders sidebar report  CBG 70 - 120: 0  units  CBG 121 - 150: 2 units  CBG 151 - 200: 3 units  CBG 201 - 250: 5 units  CBG 251 - 300: 8 units  CBG 301 - 350: 11 units  CBG 351 - 400: 15 units  CBG > 400 call MD and obtain STAT lab verification 08/03/20   Russella Dar, NP  insulin aspart (NOVOLOG) 100 UNIT/ML injection Inject 6 Units into the skin every 4 (four) hours. 08/03/20   Russella Dar, NP  insulin detemir (LEVEMIR) 100 UNIT/ML injection Inject 0.35 mLs (35 Units total) into the skin 2 (two) times daily. Patient taking differently: Inject 35 Units into the skin 2 (two) times daily. (0900 & 2100) 08/03/20   Russella Dar, NP  melatonin 3 MG TABS tablet Place 1 tablet (3 mg total) into feeding tube at bedtime. Patient taking differently: Place 3 mg into feeding tube at bedtime. (2100) 08/03/20   Russella Dar, NP  metoprolol tartrate (LOPRESSOR)  50 MG tablet Place 1 tablet (50 mg total) into feeding tube 2 (two) times daily. Patient taking differently: Place 50 mg into feeding tube 2 (two) times daily. (0800 & 2000) 08/03/20   Russella Dar, NP  Multiple Vitamin (MULTIVITAMIN WITH MINERALS) TABS tablet Place 1 tablet into feeding tube daily. Patient taking differently: Place 1 tablet into feeding tube daily. (0800) 08/04/20   Russella Dar, NP  Nutritional Supplements (FEEDING SUPPLEMENT, GLUCERNA 1.5 CAL,) LIQD Place 1,000 mLs into feeding tube continuous. Patient taking differently: Place 50 mL/hr into feeding tube continuous. 08/03/20   Russella Dar, NP  polyethylene glycol (MIRALAX / GLYCOLAX) 17 g packet Place 17 g into feeding tube daily. Patient taking differently: Place 17 g into feeding tube daily. (0800) 08/04/20   Russella Dar, NP  senna-docusate (SENOKOT-S) 8.6-50 MG tablet Place 1 tablet into feeding tube at bedtime as needed for mild constipation. 08/03/20   Russella Dar, NP  sertraline (ZOLOFT) 25 MG tablet Place 1 tablet (25 mg total) into feeding tube daily. Patient taking differently:  Place 25 mg into feeding tube daily. (0800) 08/04/20   Russella Dar, NP  valproic acid (DEPAKENE) 250 MG/5ML solution Place 5 mLs (250 mg total) into feeding tube 2 (two) times daily. Patient taking differently: Place 250 mg into feeding tube 2 (two) times daily. (0800 & 2000) 08/03/20   Russella Dar, NP  Water For Irrigation, Sterile (FREE WATER) SOLN Place 200 mLs into feeding tube every 6 (six) hours. Patient taking differently: Place 200 mLs into feeding tube every 6 (six) hours. 30-80 ml water before and after med or with interruption of feeding 08/03/20   Russella Dar, NP    Allergies    Norco [hydrocodone-acetaminophen]  Review of Systems   Review of Systems  All other systems reviewed and are negative.   Physical Exam Updated Vital Signs BP 125/70   Pulse 100   Temp 99.1 F (37.3 C) (Oral)   Resp 20   SpO2 97%   Physical Exam Vitals and nursing note reviewed.  Constitutional:      General: She is not in acute distress.    Appearance: She is well-developed.  HENT:     Head: Normocephalic.     Right Ear: External ear normal.     Left Ear: External ear normal.  Eyes:     General: No scleral icterus.       Right eye: No discharge.        Left eye: No discharge.     Conjunctiva/sclera: Conjunctivae normal.  Neck:     Trachea: No tracheal deviation.  Cardiovascular:     Rate and Rhythm: Normal rate and regular rhythm.  Pulmonary:     Effort: Pulmonary effort is normal. No respiratory distress.     Breath sounds: Normal breath sounds. No stridor. No wheezing or rales.  Abdominal:     General: Bowel sounds are normal. There is no distension.     Palpations: Abdomen is soft.     Tenderness: There is no abdominal tenderness. There is no guarding or rebound.     Comments: Feeding tube in the abdomen  Musculoskeletal:        General: No tenderness.     Cervical back: Neck supple.     Comments: Evidence of gangrenous changes in the fingertips of the right hand   Skin:    General: Skin is warm and dry.     Findings: No rash.  Neurological:     Mental Status: She is alert.     Cranial Nerves: Cranial nerve deficit (no facial droop, extraocular movements intact, no slurred speech) present.     Sensory: Sensory deficit present.     Motor: No abnormal muscle tone or seizure activity.     Coordination: Coordination abnormal.     Comments: Left-sided hemiparesis     ED Results / Procedures / Treatments   Labs (all labs ordered are listed, but only abnormal results are displayed) Labs Reviewed - No data to display  EKG None  Radiology CT Head Wo Contrast  Result Date: 09/15/2020 CLINICAL DATA:  Fall with head trauma EXAM: CT HEAD WITHOUT CONTRAST TECHNIQUE: Contiguous axial images were obtained from the base of the skull through the vertex without intravenous contrast. COMPARISON:  None. FINDINGS: Brain: Remote right MCA territory infarct with overlying decompressive craniectomy. No acute hemorrhage. No new midline shift or other mass effect. Size and configuration of the ventricles are unchanged. Vascular: No abnormal hyperdensity of the major intracranial arteries or dural venous sinuses. No intracranial atherosclerosis. Skull: Remote right craniectomy. Sinuses/Orbits: No fluid levels or advanced mucosal thickening of the visualized paranasal sinuses. No mastoid or middle ear effusion. The orbits are normal. IMPRESSION: 1. No acute intracranial abnormality. 2. Remote right MCA territory infarct with overlying decompressive craniectomy. Electronically Signed   By: Deatra RobinsonKevin  Herman M.D.   On: 09/15/2020 19:34    Procedures Procedures   Medications Ordered in ED Medications - No data to display  ED Course  I have reviewed the triage vital signs and the nursing notes.  Pertinent labs & imaging results that were available during my care of the patient were reviewed by me and considered in my medical decision making (see chart for details).    MDM  Rules/Calculators/A&P                          Patient presented to the ED for evaluation of a head injury and fall.  Patient has a significant medical history with multiple comorbidities.  She has had prior craniotomy.  Head CT fortunately does not show any signs of acute injury.  Patient otherwise appears at her baseline.  Stable to discharge back to nursing facility Final Clinical Impression(s) / ED Diagnoses Final diagnoses:  Injury of head, initial encounter    Rx / DC Orders ED Discharge Orders    None       Linwood DibblesKnapp, Azeneth Carbonell, MD 09/15/20 1948

## 2020-09-17 LAB — POCT ACTIVATED CLOTTING TIME: Activated Clotting Time: 213 seconds

## 2020-09-21 ENCOUNTER — Other Ambulatory Visit: Payer: Self-pay

## 2020-09-21 DIAGNOSIS — I742 Embolism and thrombosis of arteries of the upper extremities: Secondary | ICD-10-CM

## 2020-10-16 ENCOUNTER — Inpatient Hospital Stay (HOSPITAL_COMMUNITY): Admission: RE | Admit: 2020-10-16 | Payer: Medicaid Other | Source: Ambulatory Visit

## 2020-10-25 ENCOUNTER — Ambulatory Visit (INDEPENDENT_AMBULATORY_CARE_PROVIDER_SITE_OTHER): Payer: Medicaid Other | Admitting: Neurology

## 2020-10-25 ENCOUNTER — Encounter: Payer: Self-pay | Admitting: Neurology

## 2020-10-25 VITALS — BP 107/72 | HR 67

## 2020-10-25 DIAGNOSIS — G8110 Spastic hemiplegia affecting unspecified side: Secondary | ICD-10-CM

## 2020-10-25 DIAGNOSIS — I63511 Cerebral infarction due to unspecified occlusion or stenosis of right middle cerebral artery: Secondary | ICD-10-CM

## 2020-10-25 NOTE — Patient Instructions (Signed)
I had a long d/w patient about her recent stroke,spastic hemiplegia, hemicraniectomy, risk for recurrent stroke/TIAs, personally independently reviewed imaging studies and stroke evaluation results and answered questions.Continue Eliquis (apixaban) and   Plavix daily  for secondary stroke prevention  andfor peripheral vascular disease and maintain strict control of hypertension with blood pressure goal below 130/90, diabetes with hemoglobin A1c goal below 6.5% and lipids with LDL cholesterol goal below 70 mg/dL. I also advised the patient to eat a healthy diet with plenty of whole grains, cereals, fruits and vegetables, exercise regularly and maintain ideal body weight Refer Dr. Venetia Maxon neurosurgery for replacement of hemicraniectomy bony flap and Dr. Janee Morn for removal of PEG tube and she is eating well and no longer needs it.  Continue ongoing physical and Occupational Therapy.  Follow-up with Dr. Chestine Spore vascular surgeon for right upper extremity ischemia.  Followup in the future with my nurse practitioner Shanda Bumps in 3 months or call earlier if necessary

## 2020-10-25 NOTE — Progress Notes (Signed)
[facility] [address] [place]. [place] [address] [phone]       OFFICE FOLLOW-UP NOTE  [name] Date of Birth:  12/28/1985 Medical Record Number:  [id number]   HPI: Brandi Myers is a 35 year old Caucasian lady seen today for initial office follow-up visit following hospital admission for stroke.  History is obtained from the patient and review of electronic medical records and I personally reviewed pertinent imaging films in PACS.  She has past medical history of diabetes hypertension who was admitted on 07/04/2020 from [facility] where she presented as a code stroke with sudden then onset of right gaze deviation and dense left hemiplegia.  CT angiogram confirmed right ICA occlusion and a right common carotid artery thrombus CT perfusion showed infarct core of 51 mL with mismatch ratio of 2.  Patient was transferred to [facility] CT aspect score was 9 CT angiogram initially could not be obtained due to patient movement however subsequent delayed CT angiogram showed right ICA occlusion and right common carotid artery bifurcation thrombus.  She underwent mechanical thrombectomy with endovascular revascularization of the occluded right internal carotid artery supraclinoid segment, right middle cerebral artery and right anterior cerebral artery achieving tiki to be revascularization.  There was persistent distal occlusion of the superior division of the right MCA and anterior perisylvian branches with gradual improvement of the dominant inferior division M3 and M4 branches.  Patient had a prolonged ICU stay at and subsequently had neurological worsening due to hemorrhagic transformation and progressive cerebral edema requiring hemicraniectomy as a lifesaving procedure by Dr. Venetia Maxon on 07/06/2020.  She also developed critical limb ischemia in the right upper extremity and underwent right upper extremity thrombectomy by Dr. Chestine Spore from vascular surgery also.  She had a  prolonged ICU stay.  2D echo showed normal ejection fraction.  TCD bubble study was negative for PFO.  TEE showed no cardiac source of embolism or PFO.  Lower extremity venous Dopplers showed no evidence of DVT bilaterally.  LDL cholesterol is 99 mg percent.  Hemoglobin A1c was elevated at 10.6.  Patient was started on Eliquis after initially feeding tube and then PEG tube due to critical limb ischemia and upper extremities.  Patient was given Keppra for seizure prophylaxis which was discontinued after 2 weeks.  Hospital stay was complicated by fever and leukocytosis as well as thrombocytosis strep pneumonia respiratory cultures which was treated with course of antibiotics with ceftriaxone patient had a prolonged course on the floor and she was difficult to place due to her young age eventually she was transferred to [place] skilled nursing facility where she is currently staying.  He is able to speak in follow commands very well she is has unfortunately not made significant improvement in the left hemiparesis which remains severely weak.  She is getting physical and occupational therapy not showed any improvement.  She is unable to stand even with support but can help contribute during transfers.  She is eating well and PEG tube can come out soon.  She had a minor fall and was seen in the ER on 09/15/2020 and CT scan of the head showed no acute abnormality in the brain and showed remote right MCA infarct with decompressive craniectomy changes.  ROS:   14 system review of systems is positive for weakness, memory loss, difficulty walking, vision loss and all other systems negative  PMH:  Past Medical History:  Diagnosis Date   Diabetes mellitus without complication (HCC)    Hypertension  Social History:  Social History   Socioeconomic History   Marital status: Single    Spouse name: Not on file   Number of children: Not on file   Years of education: Not on file   Highest education level:  Not on file  Occupational History   Occupation: home maker  Tobacco Use   Smoking status: Every Day    Packs/day: 0.10    Types: Cigarettes   Smokeless tobacco: Never  Substance and Sexual Activity   Alcohol use: Not Currently   Drug use: No   Sexual activity: Not on file    Comment: tubal ligation  Other Topics Concern   Not on file  Social History Narrative   Not on file   Social Determinants of Health   Financial Resource Strain: Not on file  Food Insecurity: Not on file  Transportation Needs: Not on file  Physical Activity: Not on file  Stress: Not on file  Social Connections: Not on file  Intimate Partner Violence: Not on file    Medications:   Current Outpatient Medications on File Prior to Visit  Medication Sig Dispense Refill   acetaminophen (TYLENOL) 325 MG tablet Take 2 tablets (650 mg total) by mouth every 4 (four) hours as needed for mild pain (or temp > 37.5 C (99.5 F)).     apixaban (ELIQUIS) 5 MG TABS tablet Place 1 tablet (5 mg total) into feeding tube 2 (two) times daily. (Patient taking differently: Place 5 mg into feeding tube 2 (two) times daily. (0800 & 2000)) 60 tablet    atorvastatin (LIPITOR) 40 MG tablet Place 1 tablet (40 mg total) into feeding tube daily at 6 PM.     bisacodyl (DULCOLAX) 10 MG suppository Place 1 suppository (10 mg total) rectally daily as needed for moderate constipation. 12 suppository 0   clopidogrel (PLAVIX) 75 MG tablet Take 1 tablet (75 mg total) by mouth daily. 30 tablet 11   docusate (COLACE) 50 MG/5ML liquid Place 10 mLs (100 mg total) into feeding tube 2 (two) times daily. (Patient taking differently: Place 100 mg into feeding tube 2 (two) times daily. (0800 & 2000)) 100 mL 0   gabapentin (NEURONTIN) 250 MG/5ML solution Place 12 mLs (600 mg total) into feeding tube every 8 (eight) hours. (Patient taking differently: Place 600 mg into feeding tube every 8 (eight) hours. (0000, 0800 & 1600))  12   insulin aspart (NOVOLOG) 100  UNIT/ML injection Inject 0-15 Units into the skin every 4 (four) hours. Correction coverage: Moderate (average weight, post-op)  CBG < 70: Implement Hypoglycemia Standing Orders and refer to Hypoglycemia Standing Orders sidebar report  CBG 70 - 120: 0 units  CBG 121 - 150: 2 units  CBG 151 - 200: 3 units  CBG 201 - 250: 5 units  CBG 251 - 300: 8 units  CBG 301 - 350: 11 units  CBG 351 - 400: 15 units  CBG > 400 call MD and obtain STAT lab verification 10 mL 11   insulin aspart (NOVOLOG) 100 UNIT/ML injection Inject 6 Units into the skin every 4 (four) hours. 10 mL 11   insulin detemir (LEVEMIR) 100 UNIT/ML injection Inject 0.35 mLs (35 Units total) into the skin 2 (two) times daily. (Patient taking differently: Inject 35 Units into the skin 2 (two) times daily. (0900 & 2100)) 10 mL 11   melatonin 3 MG TABS tablet Place 1 tablet (3 mg total) into feeding tube at bedtime. (Patient taking differently: Place 3 mg into  feeding tube at bedtime. (2100))  0   metoprolol tartrate (LOPRESSOR) 50 MG tablet Place 1 tablet (50 mg total) into feeding tube 2 (two) times daily. (Patient taking differently: Place 50 mg into feeding tube 2 (two) times daily. (0800 & 2000))     Multiple Vitamin (MULTIVITAMIN WITH MINERALS) TABS tablet Place 1 tablet into feeding tube daily. (Patient taking differently: Place 1 tablet into feeding tube daily. (0800))     Nutritional Supplements (FEEDING SUPPLEMENT, GLUCERNA 1.5 CAL,) LIQD Place 1,000 mLs into feeding tube continuous. (Patient taking differently: Place 50 mL/hr into feeding tube continuous.)     polyethylene glycol (MIRALAX / GLYCOLAX) 17 g packet Place 17 g into feeding tube daily. (Patient taking differently: Place 17 g into feeding tube daily. (0800)) 14 each 0   senna-docusate (SENOKOT-S) 8.6-50 MG tablet Place 1 tablet into feeding tube at bedtime as needed for mild constipation.     sertraline (ZOLOFT) 25 MG tablet Place 1 tablet (25 mg total) into feeding tube  daily. (Patient taking differently: Place 25 mg into feeding tube daily. (0800))     valproic acid (DEPAKENE) 250 MG/5ML solution Place 5 mLs (250 mg total) into feeding tube 2 (two) times daily. (Patient taking differently: Place 250 mg into feeding tube 2 (two) times daily. (0800 & 2000)) 600 mL    Water For Irrigation, Sterile (FREE WATER) SOLN Place 200 mLs into feeding tube every 6 (six) hours. (Patient taking differently: Place 200 mLs into feeding tube every 6 (six) hours. 30-80 ml water before and after med or with interruption of feeding)     No current facility-administered medications on file prior to visit.    Allergies:   Allergies  Allergen Reactions   Norco [Hydrocodone-Acetaminophen] Itching, Nausea And Vomiting and Swelling    Physical Exam General: Frail young Caucasian lady, seated, in no evident distress.  Skull is deformed s/p hemicraniectomy on the right Head: head normocephalic and atraumatic.  Neck: supple with no carotid or supraclavicular bruits Cardiovascular: regular rate and rhythm, no murmurs Musculoskeletal: no deformity Skin:  no rash/petichiae Vascular:  Normal pulses all extremities.  Necrotic tip of the right thumb from peripheral limb ischemia.  Radial pulse quite well felt on that side. She has a PEG tube. Vitals:   10/25/20 0851  BP: 107/72  Pulse: 67   Neurologic Exam Mental Status: Awake and fully alert. Oriented to place and time. Recent and remote memory diminished. Attention span, concentration and fund of knowledge diminished. Mood and affect appropriate.  Cranial Nerves: Fundoscopic exam reveals sharp disc margins. Pupils equal, briskly reactive to light. Extraocular movements full without nystagmus. Visual fields show dense left homonymous hemianopsia.  Hearing intact. Facial sensation intact.  Left lower facial weakness., tongue, palate moves normally and symmetrically.  Motor: Spastic left hemiplegia with 0/5 strength in left upper and  lower extremity with increased tone and spasticity.  Normal strength on the right.  No clonus. Sensory.: intact to touch ,pinprick .position and vibratory sensation.  Coordination: Rapid alternating movements normal in all extremities. Finger-to-nose and heel-to-shin performed accurately bilaterally. Gait and Station: Unable to arise from the wheelchair hence deferred  reflexes: 2+ and asymmetric and brisker on the left.. Toes downgoing.   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  Prolonged complicated hospital glucose with right upper extremity ischemia requiring emergent angioplasty,, respiratory failure with strep pneumonia requiring ventilatory support, ischemic right hand and fingertips and chronic iron deficiency anemia and thrombocytosis.  Patient is currently in rehabilitation and skilled nursing facility but has significant spastic left hemiplegia and is yet unable to walk even with one-person assist..  Vascular risk factors of substance abuse with cocaine, smoking, diabetes and hyperlipidemia   4  PLAN: I had a long d/w patient about her recent stroke,spastic hemiplegia, hemicraniectomy, risk for recurrent stroke/TIAs, personally independently reviewed imaging studies and stroke evaluation results and answered questions.Continue Eliquis (apixaban) and   Plavix daily  for secondary stroke prevention  andfor peripheral vascular disease and maintain strict control of hypertension with blood pressure goal below 130/90, diabetes with hemoglobin A1c goal below 6.5% and lipids with LDL cholesterol goal below 70 mg/dL. I also advised the patient to eat a healthy diet with  plenty of whole grains, cereals, fruits and vegetables, exercise regularly and maintain ideal body weight Refer Dr. Venetia Maxon neurosurgery for replacement of hemicraniectomy bony flap and Dr. Janee Morn for removal of PEG tube and she is eating well and no longer needs it.  Continue ongoing physical and Occupational Therapy.  Follow-up with Dr. Chestine Spore vascular surgeon for right upper extremity ischemia.  Followup in the future with my nurse practitioner Brandi Myers in 3 months or call earlier if necessary Greater than 50% of time during this prolonged 45 minute visit was spent on counseling,explanation of diagnosis, planning of further management, discussion with patient and family and coordination of care Brandi Heady, MD Note: This document was prepared with digital dictation and possible smart phrase technology. Any transcriptional errors that result from this process are unintentional

## 2020-10-29 ENCOUNTER — Telehealth: Payer: Self-pay

## 2020-10-29 NOTE — Telephone Encounter (Signed)
Referral for Neurosurgery sent to [facility]. P: [phone].  Referral for General Surgery sent to [facility]. P: [phone].

## 2020-11-06 ENCOUNTER — Other Ambulatory Visit: Payer: Self-pay

## 2020-11-06 NOTE — Patient Outreach (Signed)
[facility] Customer service manager [facility]) Care Management  11/06/2020  Brandi Myers [birth year] [id number]   No Telephone outreach to patient to obtain mRS. Was successfully obtained on 10/25/20 by Dr. [name]. MRS= 4   Vanice Sarah [facility] Care Management Assistant

## 2020-12-25 ENCOUNTER — Other Ambulatory Visit: Payer: Self-pay | Admitting: Neurosurgery

## 2021-01-14 NOTE — Progress Notes (Signed)
Surgical Instructions    Your procedure is scheduled on Friday, October 7th, 2022.   Report to [facility] Main Entrance "A" at 05:30 A.M., then check in with the Admitting office.  Call this number if you have problems the morning of surgery:  [phone]   If you have any questions prior to your surgery date call [phone]: Open Monday-Friday 8am-4pm    Remember:  Do not eat after midnight the night before your surgery  You may drink clear liquids until 04:30 the morning of your surgery.   Clear liquids allowed are: Water, Non-Citrus Juices (without pulp), Carbonated Beverages, Clear Tea, Black Coffee ONLY (NO MILK, CREAM OR POWDERED CREAMER of any kind), and Gatorade    Take these medicines the morning of surgery with A SIP OF WATER:  atorvastatin (LIPITOR) gabapentin (NEURONTIN)  metoprolol tartrate (LOPRESSOR)  sertraline (ZOLOFT) valproic acid (DEPAKENE)   If needed:   acetaminophen (TYLENOL)  albuterol (VENTOLIN HFA) - please, bring the inhaler with you the day of surgery   Follow your surgeon's instructions on when to stop Aspirin, Eliquis, and Plavix.  If no instructions were given by your surgeon then you will need to call the office to get those instructions.     As of today, STOP taking any Aspirin (unless otherwise instructed by your surgeon) Aleve, Naproxen, Ibuprofen, Motrin, Advil, Goody's, BC's, all herbal medications, fish oil, and all vitamins.   WHAT DO I DO ABOUT MY DIABETES MEDICATION?   Take 50% (20 units) of insulin detemir (LEVEMIR) the night before surgery (if you are taking Levemir at night), or the day of surgery (if you are taking it in the morning)  Do not take TRULICITY the day of surgery.   HOW TO MANAGE YOUR DIABETES BEFORE AND AFTER SURGERY  Why is it important to control my blood sugar before and after surgery? Improving blood sugar levels before and after surgery helps healing and can limit problems. A way of improving blood  sugar control is eating a healthy diet by:  Eating less sugar and carbohydrates  Increasing activity/exercise  Talking with your doctor about reaching your blood sugar goals High blood sugars (greater than 180 mg/dL) can raise your risk of infections and slow your recovery, so you will need to focus on controlling your diabetes during the weeks before surgery. Make sure that the doctor who takes care of your diabetes knows about your planned surgery including the date and location.  How do I manage my blood sugar before surgery? Check your blood sugar at least 4 times a day, starting 2 days before surgery, to make sure that the level is not too high or low.  Check your blood sugar the morning of your surgery when you wake up and every 2 hours until you get to the Short Stay unit.  If your blood sugar is less than 70 mg/dL, you will need to treat for low blood sugar: Do not take insulin. Treat a low blood sugar (less than 70 mg/dL) with  cup of clear juice (cranberry or apple), 4 glucose tablets, OR glucose gel. Recheck blood sugar in 15 minutes after treatment (to make sure it is greater than 70 mg/dL). If your blood sugar is not greater than 70 mg/dL on recheck, call [phone] for further instructions. Report your blood sugar to the short stay nurse when you get to Short Stay.  If you are admitted to the hospital after surgery: Your blood sugar will be checked by the staff and you  will probably be given insulin after surgery (instead of oral diabetes medicines) to make sure you have good blood sugar levels. The goal for blood sugar control after surgery is 80-180 mg/dL.     After your COVID test   You are not required to quarantine however you are required to wear a well-fitting mask when you are out and around people not in your household.  If your mask becomes wet or soiled, replace with a new one.  Wash your hands often with soap and water for 20 seconds or clean your hands with  an alcohol-based hand sanitizer that contains at least 60% alcohol.  Do not share personal items.  Notify your provider: if you are in close contact with someone who has COVID  or if you develop a fever of 100.4 or greater, sneezing, cough, sore throat, shortness of breath or body aches.     The day of surgery:          Do not wear jewelry or makeup Do not wear lotions, powders, perfumes, or deodorant. Do not shave 48 hours prior to surgery. Do not bring valuables to the hospital. DO Not wear nail polish, gel polish, artificial nails, or any other type of covering on natural nails including finger and toenails. If patients have artificial nails, gel coating, etc. that need to be removed by a nail salon please have this removed prior to surgery or surgery may need to be canceled/delayed if the surgeon/ anesthesia feels like the patient is unable to be adequately monitored.              [facility] is not responsible for any belongings or valuables.  Do NOT Smoke (Tobacco/Vaping)  24 hours prior to your procedure If you use a CPAP at night, you may bring your mask for your overnight stay.   Contacts, glasses, dentures or bridgework may not be worn into surgery, please bring cases for these belongings   For patients admitted to the hospital, discharge time will be determined by your treatment team.   Patients discharged the day of surgery will not be allowed to drive home, and someone needs to stay with them for 24 hours.  NO VISITORS WILL BE ALLOWED IN PRE-OP WHERE PATIENTS ARE PREPPED FOR SURGERY.  ONLY 1 SUPPORT PERSON MAY BE PRESENT IN THE WAITING ROOM WHILE YOU ARE IN SURGERY.  IF YOU ARE TO BE ADMITTED, ONCE YOU ARE IN YOUR ROOM YOU WILL BE ALLOWED TWO (2) VISITORS. 1 (ONE) VISITOR MAY STAY OVERNIGHT BUT MUST ARRIVE TO THE ROOM BY 8pm.  Minor children may have two parents present. Special consideration for safety and communication needs will be reviewed on a case by case  basis.  Special instructions:    Oral Hygiene is also important to reduce your risk of infection.  Remember - BRUSH YOUR TEETH THE MORNING OF SURGERY WITH YOUR REGULAR TOOTHPASTE   [place]- Preparing For Surgery  Before surgery, you can play an important role. Because skin is not sterile, your skin needs to be as free of germs as possible. You can reduce the number of germs on your skin by washing with CHG (chlorahexidine gluconate) Soap before surgery.  CHG is an antiseptic cleaner which kills germs and bonds with the skin to continue killing germs even after washing.     Please do not use if you have an allergy to CHG or antibacterial soaps. If your skin becomes reddened/irritated stop using the CHG.  Do not shave (  including legs and underarms) for at least 48 hours prior to first CHG shower. It is OK to shave your face.  Please follow these instructions carefully.     Shower the NIGHT BEFORE SURGERY and the MORNING OF SURGERY with CHG Soap.   If you chose to wash your hair, wash your hair first as usual with your normal shampoo. After you shampoo, rinse your hair and body thoroughly to remove the shampoo.  Then [employer] and genitals (private parts) with your normal soap and rinse thoroughly to remove soap.  After that Use CHG Soap as you would any other liquid soap. You can apply CHG directly to the skin and wash gently with a scrungie or a clean washcloth.   Apply the CHG Soap to your body ONLY FROM THE NECK DOWN.  Do not use on open wounds or open sores. Avoid contact with your eyes, ears, mouth and genitals (private parts). Wash Face and genitals (private parts)  with your normal soap.   Wash thoroughly, paying special attention to the area where your surgery will be performed.  Thoroughly rinse your body with warm water from the neck down.  DO NOT shower/wash with your normal soap after using and rinsing off the CHG Soap.  Pat yourself dry with a CLEAN TOWEL.  Wear CLEAN  PAJAMAS to bed the night before surgery  Place CLEAN SHEETS on your bed the night before your surgery  DO NOT SLEEP WITH PETS.   Day of Surgery:  Take a shower with CHG soap. Wear Clean/Comfortable clothing the morning of surgery Do not apply any deodorants/lotions.   Remember to brush your teeth WITH YOUR REGULAR TOOTHPASTE.   Please read over the following fact sheets that you were given.

## 2021-01-15 ENCOUNTER — Encounter (HOSPITAL_COMMUNITY)
Admission: RE | Admit: 2021-01-15 | Discharge: 2021-01-15 | Disposition: A | Discharge status: Home / Self Care | Payer: Medicaid Other | Source: Ambulatory Visit | Attending: Neurosurgery | Admitting: Neurosurgery

## 2021-01-15 ENCOUNTER — Encounter (HOSPITAL_COMMUNITY): Payer: Self-pay

## 2021-01-15 ENCOUNTER — Other Ambulatory Visit: Payer: Self-pay

## 2021-01-15 DIAGNOSIS — Z20822 Contact with and (suspected) exposure to covid-19: Secondary | ICD-10-CM | POA: Diagnosis not present

## 2021-01-15 DIAGNOSIS — Z01812 Encounter for preprocedural laboratory examination: Secondary | ICD-10-CM | POA: Diagnosis not present

## 2021-01-15 HISTORY — DX: Unspecified asthma, uncomplicated: J45.909

## 2021-01-15 LAB — CBC
HCT: 45.3 % (ref 36.0–46.0)
Hemoglobin: 14.7 g/dL (ref 12.0–15.0)
MCH: 29.8 pg (ref 26.0–34.0)
MCHC: 32.5 g/dL (ref 30.0–36.0)
MCV: 91.9 fL (ref 80.0–100.0)
Platelets: 299 10*3/uL (ref 150–400)
RBC: 4.93 MIL/uL (ref 3.87–5.11)
RDW: 13 % (ref 11.5–15.5)
WBC: 10.2 10*3/uL (ref 4.0–10.5)
nRBC: 0 % (ref 0.0–0.2)

## 2021-01-15 LAB — TYPE AND SCREEN
ABO/RH(D): O POS
Antibody Screen: NEGATIVE

## 2021-01-15 LAB — HEMOGLOBIN A1C
Hgb A1c MFr Bld: 5.5 % (ref 4.8–5.6)
Mean Plasma Glucose: 111.15 mg/dL

## 2021-01-15 LAB — BASIC METABOLIC PANEL
Anion gap: 11 (ref 5–15)
BUN: 6 mg/dL (ref 6–20)
CO2: 25 mmol/L (ref 22–32)
Calcium: 9.5 mg/dL (ref 8.9–10.3)
Chloride: 103 mmol/L (ref 98–111)
Creatinine, Ser: 0.54 mg/dL (ref 0.44–1.00)
GFR, Estimated: >60 mL/min (ref >60)
Glucose, Bld: 183 mg/dL — ABNORMAL HIGH (ref 70–99)
Potassium: 3.7 mmol/L (ref 3.5–5.1)
Sodium: 139 mmol/L (ref 135–145)

## 2021-01-15 LAB — SARS CORONAVIRUS 2 (TAT 6-24 HRS): SARS Coronavirus 2: NEGATIVE

## 2021-01-15 LAB — GLUCOSE, CAPILLARY: Glucose-Capillary: 160 mg/dL — ABNORMAL HIGH (ref 70–99)

## 2021-01-15 NOTE — Progress Notes (Signed)
PCP: [facility] in [place].  Sees Dr. Letta Pate Cardiologist: denies  EKG: 08/21/20 CXR: na ECHO: denies Stress Test: denies Cardiac Cath: denies  Fasting Blood Sugar- 100-150 Checks Blood Sugar__1_ times a day  OSA/CPAP: No  ASA/Plavix/Eliquis last doses of each 01/11/21  Covid test 01/15/21 at PAT  Anesthesia Review: Yes, blood thinners  Patient denies shortness of breath, fever, cough, and chest pain at PAT appointment.  Patient verbalized understanding of instructions provided today at the PAT appointment.  Patient asked to review instructions at home and day of surgery.

## 2021-01-15 NOTE — H&P (Signed)
Patient ID:   [id number] Patient: Brandi Myers  Date of Birth: 02/27/1986 Visit Type: Office Visit   Date: 12/24/2020 10:00 AM Provider: Danae Orleans. Venetia Maxon MD   This 35 year old female presents for Discuss Sx.  HISTORY OF PRESENT ILLNESS: 1.  Discuss Sx  Brandi Myers, 35 year old female, visits to discuss replacement of bone flap.  She underwent right craniectomy 07/06/2020 with placement of bone flap in the abdomen.  She is now home and working with physical therapy and occupational therapy.  Scalp and abdominal incisions are well healed and nontender.  Family present report she was taken out of [employer] in early August due to poor care.  She is now home with family.  Physical therapy and occupational therapy visit the home.  Patient is now able to walk across the room with significant standby assistance.  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 Surgical history:  Right craniectomy with flap to abdomen 07/06/2020, peg removed early August  Dr. Janee Morn:  PEG Dr. Chestine Spore:  Right upper extremity ischemia Dr. [name]: stroke  The patient is made significant improvement following her craniectomy.  She is verbal.  She is in a wheelchair.  She has a left hemiparesis are more affected than leg.       PAST MEDICAL/SURGICAL HISTORY:   (Detailed)   Disease/disorder Onset Date Management Date Comments Diabetes     Hypercholesterolemia     Stroke        PAST MEDICAL HISTORY, SURGICAL HISTORY, FAMILY HISTORY, SOCIAL HISTORY AND REVIEW OF SYSTEMS I have reviewed the patient's past medical, surgical, family and social history as well as the comprehensive review of systems as included on the [facility] history form dated 12/24/2020, which I have signed.  Family History:  (Detailed)   Social  History:  (Detailed) Tobacco use reviewed. Preferred language is [place].   Tobacco use status: Moderate cigarette smoker (10-19 cigs/day). Smoking status: Heavy tobacco smoker.  SMOKING STATUS Type Smoking Status Usage Per Day Years Used Total Pack Years Cigarette Heavy tobacco smoker 0.5 Packs    TOBACCO CESSATION INFORMATION Date Counseled By Order Status Description Code Tobacco Cessation Information 12/24/2020 [place] Tobacco cessation counseling completed   Smoking cessation education      MEDICATIONS: (added, continued or stopped this visit) Started Medication Directions Instruction Stopped  acetaminophen 325 mg tablet take 2 tablet by oral route  every 4 hours as needed    Aspirin Low Dose 81 mg tablet,delayed release take 1 tablet by oral route  every day    atorvastatin 40 mg tablet take 1 tablet by oral route  every day    bisacodyl 10 mg rectal suppository insert 1 suppository by rectal route  every day as needed for constipation  12/24/2020  clopidogrel 75 mg tablet take 1 tablet by oral route  every day    docusate sodium 50 mg/5 mL oral liquid place 10 milliliters into feeding tube 2 times every day  12/24/2020  Eliquis 5 mg tablet take 1 tablet by oral route 2 times every day    gabapentin 250 mg/5 mL oral solution place 12 milliliters into feeding tube every 8 hours    Glucerna 1.5 Cal 0.08 gram-1.5 kcal/mL oral liquid place 1,000 mLs into feeding tube continuous  12/24/2020  insulin aspart (U-100) 100 unit/mL (3 mL) subcutaneous pen inject by subcutaneous route per prescriber's instructions.  Insulin dosing requires individualization.  12/24/2020  Levemir U-100 Insulin 100 unit/mL subcutaneous solution inject by subcutaneous route per prescriber's instructions. Insulin dosing requires individualization.    melatonin 3 mg tablet place 1 tablet into feeding tube every day at  bedtime    metoprolol tartrate 50 mg tablet place 1 tablet into feeding tube 2 times every day    Multiple Vitamin-Minerals tablet place 1 tablet into feeding tube every day    polyethylene glycol 3350 17 gram oral powder packet place 17g into feeding tube every day  12/24/2020  Senokot-S 8.6 mg-50 mg tablet place 1 tablet into feeding tube at bedtime as needed for mild constipation  12/24/2020  sertraline 25 mg tablet place 1 tablet into feeding tube every day    Trulicity inject by subcutaneous route  every week    valproic acid (as sodium salt) 250 mg/5 mL oral solution place 5 milliliters into feeding tube 2 times every day    water for irrigation, sterile solution place 200 mLs into feeding tube every 6 hours  12/24/2020    ALLERGIES: Ingredient Reaction Medication Name Comment HYDROCODONE BITARTRATE  Norco  ACETAMINOPHEN  Norco   Reviewed, updated.   REVIEW OF SYSTEMS  See scanned patient registration form, dated 12/24/2020, signed and dated on 12/24/2020  Review of Systems Details System Neg/Pos Details Constitutional Negative Chills, Fatigue, Fever, Malaise, Night sweats, Weight gain and Weight loss. ENMT Negative Ear drainage, Hearing loss, Nasal drainage, Otalgia, Sinus pressure and Sore throat. Eyes Negative Eye discharge, Eye pain and Vision changes. Respiratory Negative Chronic cough, Cough, Dyspnea, Known TB exposure and Wheezing. Cardio Negative Chest pain, Claudication, Edema and Irregular heartbeat/palpitations. GI Negative Abdominal pain, Blood in stool, Change in stool pattern, Constipation, Decreased appetite, Diarrhea, Heartburn, Nausea and Vomiting. GU Negative Dysuria, Hematuria, Polyuria (Genitourinary), Urinary frequency, Urinary incontinence and Urinary retention. Endocrine Negative Cold intolerance, Heat intolerance, Polydipsia and Polyphagia. Neuro Positive Extremity weakness, Gait  disturbance. Psych Negative Anxiety, Depression and Insomnia. Integumentary Negative Brittle hair, Brittle nails, Change in shape/size of mole(s), Hair loss, Hirsutism, Hives, Pruritus, Rash and Skin lesion. MS Positive Muscle weakness. Hema/Lymph Negative Easy bleeding, Easy bruising and Lymphadenopathy. Allergic/Immuno Negative Contact allergy, Environmental allergies, Food allergies and Seasonal allergies. Reproductive Negative Breast discharge, Breast lumps, Dysmenorrhea, Dyspareunia, History of abnormal PAP smear, Hot flashes, Irregular menses and Vaginal discharge.  PHYSICAL EXAM:  Vitals Date Temp F BP Pulse Ht In Wt Lb BMI BSA Pain Score 12/24/2020  112/79 105 66 164 26.47  0/10   PHYSICAL EXAM Details General Level of Distress: no acute distress Overall Appearance: normal  Head and Face  Right Left  Fundoscopic Exam:  normal normal    Cardiovascular Cardiac: regular rate and rhythm without murmur  Right Left  Carotid Pulses: normal normal  Respiratory Lungs: clear to auscultation  Neurological Orientation: normal Recent and Remote Memory: normal Attention Span and Concentration:   normal Language: normal Fund of Knowledge: normal  Right Left Sensation: normal normal Upper Extremity Coordination: normal normal  Lower Extremity Coordination: normal normal  Musculoskeletal Gait and Station: normal  Right Left Upper Extremity Muscle Strength: normal decreased, hemiparesis Lower Extremity Muscle Strength: normal hemiparesis Upper Extremity Muscle Tone:  normal spastic Lower Extremity Muscle Tone: normal spastic   Motor Strength Upper and lower extremity motor strength was tested in the clinically pertinent muscles.     Deep Tendon Reflexes  Right Left Biceps: normal normal Triceps: normal normal Brachioradialis: normal normal Patellar: normal normal Achilles: normal normal  Cranial  Nerves II. Optic Nerve/Visual  Fields: normal III. Oculomotor: normal IV. Trochlear: normal V. Trigeminal: normal VI. Abducens: normal VII. Facial: normal VIII. Acoustic/Vestibular: normal IX. Glossopharyngeal: normal X. Vagus: normal XI. Spinal Accessory: normal XII. Hypoglossal: normal  Motor and other Tests Lhermittes: negative Rhomberg: negative Pronator drift: absent     Right Left Hoffman's: normal normal Clonus: normal normal Babinski: normal normal   Additional Findings:  Patient is in wheelchair with left hemiparesis, left arm greater than leg affected    IMPRESSION:  The patient has improved significantly following her stroke and has gone through rehabilitation.  She has been on Plavix and Eliquis for right upper extremity DVT but I spoke with Dr. [name], who says that I can hold these anticoagulants and go ahead with cranioplasty.  The patient is eager to have this done.  Patient is on an anti-coagulant, anti-inflammatory or supplement that may increase bleeding time. Patient advised to stop medicine prior to surgery.  Comments:  Okay per Dr.Sethi for patient to stop Plavix and Eliquis 1 week prior to surgery  PLAN: Hold anticoagulation and proceed with right abdominal bone flap cranioplasty for right cranial defect.  Risks and benefits were discussed in detail with the patient plain and they wishe to proceed with surgery  Orders: Instruction(s)/Education: Assessment Instruction  Tobacco cessation counseling Z68.26 Lifestyle education regarding diet  Completed Orders (this encounter) Order Details Reason Side Interpretation Result Initial Treatment Date Region Tobacco cessation counseling        Lifestyle education regarding diet Encouraged patient to eat well balanced diet        Assessment/Plan  # Detail Type Description  1. Assessment Status post craniectomy (I71.245).     2. Assessment Left spastic hemiplegia (G81.14).      3. Assessment Body mass index (BMI) 26.0-26.9, adult (Y09.98).  Plan Orders Today's instructions / counseling include(s) Lifestyle education regarding diet. Clinical information/comments: Encouraged patient to eat well balanced diet.       Pain Management Plan Pain Scale: 0/10. Method: Numeric Pain Intensity Scale.              Provider:  Danae Orleans. Venetia Maxon MD  12/29/2020 02:10 PM    Dictation edited by: Danae Orleans. Venetia Maxon    CC Providers: Karie Fetch [facility] [address] [place],  [place]  [address]-   Caryl Pina  [address] [place], [place] [address]               Electronically signed by Danae Orleans Venetia Maxon MD on 12/29/2020 02:10 PM

## 2021-01-18 ENCOUNTER — Encounter (HOSPITAL_COMMUNITY): Payer: Self-pay | Admitting: Neurosurgery

## 2021-01-18 ENCOUNTER — Inpatient Hospital Stay (HOSPITAL_COMMUNITY): Payer: Medicaid Other | Admitting: Certified Registered"

## 2021-01-18 ENCOUNTER — Inpatient Hospital Stay (HOSPITAL_COMMUNITY): Payer: Medicaid Other | Admitting: Emergency Medicine

## 2021-01-18 ENCOUNTER — Inpatient Hospital Stay (HOSPITAL_COMMUNITY)
Admission: RE | Admit: 2021-01-18 | Discharge: 2021-01-21 | DRG: 580 | Disposition: A | Discharge status: Home / Self Care | Payer: Medicaid Other | Attending: Neurosurgery | Admitting: Neurosurgery

## 2021-01-18 ENCOUNTER — Inpatient Hospital Stay (HOSPITAL_COMMUNITY)
Admission: RE | Disposition: A | Discharge status: Home / Self Care | Payer: Self-pay | Source: Home / Self Care | Attending: Neurosurgery

## 2021-01-18 DIAGNOSIS — Z9889 Other specified postprocedural states: Secondary | ICD-10-CM

## 2021-01-18 DIAGNOSIS — I10 Essential (primary) hypertension: Secondary | ICD-10-CM | POA: Diagnosis present

## 2021-01-18 DIAGNOSIS — Z428 Encounter for other plastic and reconstructive surgery following medical procedure or healed injury: Principal | ICD-10-CM

## 2021-01-18 DIAGNOSIS — E119 Type 2 diabetes mellitus without complications: Secondary | ICD-10-CM | POA: Diagnosis present

## 2021-01-18 DIAGNOSIS — E7800 Pure hypercholesterolemia, unspecified: Secondary | ICD-10-CM | POA: Diagnosis present

## 2021-01-18 DIAGNOSIS — G8114 Spastic hemiplegia affecting left nondominant side: Secondary | ICD-10-CM | POA: Diagnosis present

## 2021-01-18 DIAGNOSIS — F17210 Nicotine dependence, cigarettes, uncomplicated: Secondary | ICD-10-CM | POA: Diagnosis present

## 2021-01-18 DIAGNOSIS — I69354 Hemiplegia and hemiparesis following cerebral infarction affecting left non-dominant side: Secondary | ICD-10-CM

## 2021-01-18 HISTORY — PX: CRANIOTOMY: SHX93

## 2021-01-18 LAB — POCT PREGNANCY, URINE: Preg Test, Ur: NEGATIVE

## 2021-01-18 LAB — GLUCOSE, CAPILLARY
Glucose-Capillary: 160 mg/dL — ABNORMAL HIGH (ref 70–99)
Glucose-Capillary: 206 mg/dL — ABNORMAL HIGH (ref 70–99)

## 2021-01-18 SURGERY — CRANIOTOMY BONE FLAP/PROSTHETIC PLATE
Anesthesia: General

## 2021-01-18 MED ORDER — ROCURONIUM BROMIDE 10 MG/ML (PF) SYRINGE
PREFILLED_SYRINGE | INTRAVENOUS | Status: DC | PRN
  Administered 2021-01-18: 20 mg via INTRAVENOUS
  Administered 2021-01-18: 60 mg via INTRAVENOUS
  Administered 2021-01-18: 40 mg via INTRAVENOUS

## 2021-01-18 MED ORDER — HYDROMORPHONE HCL 1 MG/ML IJ SOLN
0.5000 mg | INTRAMUSCULAR | Status: DC | PRN
  Administered 2021-01-18 – 2021-01-21 (×12): 0.5 mg via INTRAVENOUS
  Filled 2021-01-18 (×13): qty 0.5

## 2021-01-18 MED ORDER — CHLORHEXIDINE GLUCONATE CLOTH 2 % EX PADS: 6.0000 | MEDICATED_PAD | Freq: Once | CUTANEOUS | Status: DC

## 2021-01-18 MED ORDER — METOPROLOL TARTRATE 5 MG/5ML IV SOLN
INTRAVENOUS | Status: AC
  Filled 2021-01-18: qty 5

## 2021-01-18 MED ORDER — MELATONIN 3 MG PO TABS
3.0000 mg | ORAL_TABLET | Freq: Every day | ORAL | Status: DC
  Administered 2021-01-18 – 2021-01-20 (×3): 3 mg via ORAL
  Filled 2021-01-18 (×3): qty 1

## 2021-01-18 MED ORDER — OXYCODONE HCL 5 MG/5ML PO SOLN
5.0000 mg | Freq: Once | ORAL | Status: DC | PRN
Start: 2021-01-18 — End: 2021-01-18

## 2021-01-18 MED ORDER — LEVETIRACETAM IN NACL 500 MG/100ML IV SOLN
500.0000 mg | Freq: Two times a day (BID) | INTRAVENOUS | Status: DC
  Administered 2021-01-18 – 2021-01-20 (×4): 500 mg via INTRAVENOUS
  Filled 2021-01-18 (×4): qty 100

## 2021-01-18 MED ORDER — METOPROLOL TARTRATE 50 MG PO TABS
50.0000 mg | ORAL_TABLET | Freq: Two times a day (BID) | ORAL | Status: DC
  Administered 2021-01-18 – 2021-01-21 (×6): 50 mg via ORAL
  Filled 2021-01-18 (×6): qty 1

## 2021-01-18 MED ORDER — ACETAMINOPHEN 325 MG PO TABS
650.0000 mg | ORAL_TABLET | ORAL | Status: DC | PRN
  Administered 2021-01-20: 650 mg via ORAL
  Filled 2021-01-18: qty 2

## 2021-01-18 MED ORDER — HYDROMORPHONE HCL 1 MG/ML IJ SOLN
0.2500 mg | INTRAMUSCULAR | Status: DC | PRN
  Administered 2021-01-18: 0.5 mg via INTRAVENOUS
  Administered 2021-01-18 (×2): 0.25 mg via INTRAVENOUS

## 2021-01-18 MED ORDER — TRETINOIN 0.025 % EX CREA: 1.0000 "application " | TOPICAL_CREAM | Freq: Every day | CUTANEOUS | Status: DC

## 2021-01-18 MED ORDER — FENTANYL CITRATE (PF) 250 MCG/5ML IJ SOLN
INTRAMUSCULAR | Status: AC
  Filled 2021-01-18: qty 5

## 2021-01-18 MED ORDER — ACETAMINOPHEN 160 MG/5ML PO SOLN: 325.0000 mg | ORAL | Status: DC | PRN

## 2021-01-18 MED ORDER — OXYCODONE HCL 5 MG PO TABS: 5.0000 mg | ORAL_TABLET | Freq: Once | ORAL | Status: DC | PRN

## 2021-01-18 MED ORDER — PROPOFOL 10 MG/ML IV BOLUS
INTRAVENOUS | Status: DC | PRN
  Administered 2021-01-18: 130 mg via INTRAVENOUS

## 2021-01-18 MED ORDER — PROPOFOL 10 MG/ML IV BOLUS
INTRAVENOUS | Status: AC
  Filled 2021-01-18: qty 20

## 2021-01-18 MED ORDER — POLYETHYLENE GLYCOL 3350 17 G PO PACK: 17.0000 g | PACK | Freq: Every day | ORAL | Status: DC

## 2021-01-18 MED ORDER — BISACODYL 10 MG RE SUPP: 10.0000 mg | Freq: Every day | RECTAL | Status: DC | PRN

## 2021-01-18 MED ORDER — ACETAMINOPHEN 10 MG/ML IV SOLN
INTRAVENOUS | Status: AC
  Administered 2021-01-18: 1000 mg
  Filled 2021-01-18: qty 100

## 2021-01-18 MED ORDER — SODIUM CHLORIDE 0.9 % IV SOLN: INTRAVENOUS | Status: DC | PRN

## 2021-01-18 MED ORDER — ONDANSETRON HCL 4 MG/2ML IJ SOLN
INTRAMUSCULAR | Status: DC | PRN
  Administered 2021-01-18: 4 mg via INTRAVENOUS

## 2021-01-18 MED ORDER — GABAPENTIN 250 MG/5ML PO SOLN: 600.0000 mg | Freq: Three times a day (TID) | ORAL | Status: DC

## 2021-01-18 MED ORDER — ROCURONIUM BROMIDE 10 MG/ML (PF) SYRINGE
PREFILLED_SYRINGE | INTRAVENOUS | Status: AC
  Filled 2021-01-18: qty 10

## 2021-01-18 MED ORDER — ACETAMINOPHEN 325 MG PO TABS: 650.0000 mg | ORAL_TABLET | ORAL | Status: DC | PRN

## 2021-01-18 MED ORDER — LACTATED RINGERS IV SOLN: INTRAVENOUS | Status: DC

## 2021-01-18 MED ORDER — DOCUSATE SODIUM 100 MG PO CAPS
100.0000 mg | ORAL_CAPSULE | Freq: Two times a day (BID) | ORAL | Status: DC
  Administered 2021-01-18 – 2021-01-21 (×6): 100 mg via ORAL
  Filled 2021-01-18 (×6): qty 1

## 2021-01-18 MED ORDER — LIDOCAINE 2% (20 MG/ML) 5 ML SYRINGE
INTRAMUSCULAR | Status: DC | PRN
  Administered 2021-01-18: 30 mg via INTRAVENOUS

## 2021-01-18 MED ORDER — ONDANSETRON HCL 4 MG/2ML IJ SOLN
4.0000 mg | INTRAMUSCULAR | Status: DC | PRN
  Administered 2021-01-18: 4 mg via INTRAVENOUS
  Filled 2021-01-18: qty 2

## 2021-01-18 MED ORDER — LIDOCAINE-EPINEPHRINE 1 %-1:100000 IJ SOLN
INTRAMUSCULAR | Status: AC
  Filled 2021-01-18: qty 1

## 2021-01-18 MED ORDER — METOPROLOL TARTRATE 5 MG/5ML IV SOLN
2.0000 mg | Freq: Once | INTRAVENOUS | Status: AC
  Administered 2021-01-18: 2 mg via INTRAVENOUS

## 2021-01-18 MED ORDER — BACITRACIN ZINC 500 UNIT/GM EX OINT
TOPICAL_OINTMENT | CUTANEOUS | Status: AC
  Filled 2021-01-18: qty 28.35

## 2021-01-18 MED ORDER — FREE WATER: 200.0000 mL | Freq: Four times a day (QID) | Status: DC

## 2021-01-18 MED ORDER — ALBUTEROL SULFATE HFA 108 (90 BASE) MCG/ACT IN AERS: 1.0000 | INHALATION_SPRAY | Freq: Four times a day (QID) | RESPIRATORY_TRACT | Status: DC | PRN

## 2021-01-18 MED ORDER — FLEET ENEMA 7-19 GM/118ML RE ENEM: 1.0000 | ENEMA | Freq: Once | RECTAL | Status: DC | PRN

## 2021-01-18 MED ORDER — CEFAZOLIN SODIUM-DEXTROSE 2-4 GM/100ML-% IV SOLN
2.0000 g | INTRAVENOUS | Status: AC
  Administered 2021-01-18: 2 g via INTRAVENOUS
  Filled 2021-01-18: qty 100

## 2021-01-18 MED ORDER — PANTOPRAZOLE SODIUM 40 MG IV SOLR
40.0000 mg | Freq: Every day | INTRAVENOUS | Status: DC
  Administered 2021-01-18: 40 mg via INTRAVENOUS
  Filled 2021-01-18: qty 40

## 2021-01-18 MED ORDER — ALBUTEROL SULFATE (2.5 MG/3ML) 0.083% IN NEBU: 2.5000 mg | INHALATION_SOLUTION | Freq: Four times a day (QID) | RESPIRATORY_TRACT | Status: DC | PRN

## 2021-01-18 MED ORDER — MIDAZOLAM HCL 2 MG/2ML IJ SOLN
INTRAMUSCULAR | Status: AC
  Filled 2021-01-18: qty 2

## 2021-01-18 MED ORDER — VALPROIC ACID 250 MG PO CAPS
250.0000 mg | ORAL_CAPSULE | Freq: Two times a day (BID) | ORAL | Status: DC
  Administered 2021-01-18 – 2021-01-21 (×6): 250 mg via ORAL
  Filled 2021-01-18 (×7): qty 1

## 2021-01-18 MED ORDER — FENTANYL CITRATE (PF) 100 MCG/2ML IJ SOLN
INTRAMUSCULAR | Status: AC
  Filled 2021-01-18: qty 2

## 2021-01-18 MED ORDER — ORAL CARE MOUTH RINSE: 15.0000 mL | Freq: Once | OROMUCOSAL | Status: AC

## 2021-01-18 MED ORDER — GLUCERNA 1.5 CAL PO LIQD: 1000.0000 mL | ORAL | Status: DC

## 2021-01-18 MED ORDER — BENZOYL PEROXIDE-ERYTHROMYCIN 5-3 % EX GEL: 1.0000 "application " | Freq: Two times a day (BID) | CUTANEOUS | Status: DC

## 2021-01-18 MED ORDER — DOCUSATE SODIUM 50 MG/5ML PO LIQD: 100.0000 mg | Freq: Two times a day (BID) | ORAL | Status: DC

## 2021-01-18 MED ORDER — ADULT MULTIVITAMIN W/MINERALS CH
1.0000 | ORAL_TABLET | Freq: Every day | ORAL | Status: DC
  Administered 2021-01-18 – 2021-01-21 (×4): 1 via ORAL
  Filled 2021-01-18 (×3): qty 1

## 2021-01-18 MED ORDER — CEFAZOLIN SODIUM-DEXTROSE 1-4 GM/50ML-% IV SOLN
1.0000 g | Freq: Three times a day (TID) | INTRAVENOUS | Status: AC
  Administered 2021-01-18 – 2021-01-19 (×2): 1 g via INTRAVENOUS
  Filled 2021-01-18: qty 50

## 2021-01-18 MED ORDER — DULAGLUTIDE 0.75 MG/0.5ML ~~LOC~~ SOAJ: 0.7500 mg | SUBCUTANEOUS | Status: DC

## 2021-01-18 MED ORDER — ACETAMINOPHEN 650 MG RE SUPP
650.0000 mg | RECTAL | Status: DC | PRN
Start: 2021-01-18 — End: 2021-01-18

## 2021-01-18 MED ORDER — SERTRALINE HCL 25 MG PO TABS
25.0000 mg | ORAL_TABLET | Freq: Every day | ORAL | Status: DC
  Administered 2021-01-18 – 2021-01-21 (×4): 25 mg via ORAL
  Filled 2021-01-18 (×4): qty 1

## 2021-01-18 MED ORDER — 0.9 % SODIUM CHLORIDE (POUR BTL) OPTIME
TOPICAL | Status: DC | PRN
  Administered 2021-01-18: 2000 mL

## 2021-01-18 MED ORDER — LIDOCAINE 2% (20 MG/ML) 5 ML SYRINGE
INTRAMUSCULAR | Status: AC
  Filled 2021-01-18: qty 5

## 2021-01-18 MED ORDER — FENTANYL CITRATE (PF) 100 MCG/2ML IJ SOLN
INTRAMUSCULAR | Status: DC | PRN
  Administered 2021-01-18 (×5): 50 ug via INTRAVENOUS

## 2021-01-18 MED ORDER — BUPIVACAINE HCL (PF) 0.5 % IJ SOLN
INTRAMUSCULAR | Status: AC
  Filled 2021-01-18: qty 30

## 2021-01-18 MED ORDER — PROMETHAZINE HCL 12.5 MG PO TABS
12.5000 mg | ORAL_TABLET | ORAL | Status: DC | PRN
  Administered 2021-01-18 – 2021-01-20 (×2): 25 mg via ORAL
  Filled 2021-01-18 (×3): qty 2

## 2021-01-18 MED ORDER — DEXAMETHASONE SODIUM PHOSPHATE 10 MG/ML IJ SOLN
INTRAMUSCULAR | Status: AC
  Filled 2021-01-18: qty 1

## 2021-01-18 MED ORDER — THROMBIN 20000 UNITS EX SOLR: OROMUCOSAL | Status: DC | PRN

## 2021-01-18 MED ORDER — SENNOSIDES-DOCUSATE SODIUM 8.6-50 MG PO TABS: 1.0000 | ORAL_TABLET | Freq: Every evening | ORAL | Status: DC | PRN

## 2021-01-18 MED ORDER — CHLORHEXIDINE GLUCONATE 0.12 % MT SOLN: 15.0000 mL | Freq: Once | OROMUCOSAL | Status: AC

## 2021-01-18 MED ORDER — POLYETHYLENE GLYCOL 3350 17 G PO PACK: 17.0000 g | PACK | Freq: Every day | ORAL | Status: DC | PRN

## 2021-01-18 MED ORDER — MEPERIDINE HCL 25 MG/ML IJ SOLN: 6.2500 mg | INTRAMUSCULAR | Status: DC | PRN

## 2021-01-18 MED ORDER — ASPIRIN EC 81 MG PO TBEC
81.0000 mg | DELAYED_RELEASE_TABLET | Freq: Every day | ORAL | Status: DC
  Administered 2021-01-19 – 2021-01-21 (×3): 81 mg via ORAL
  Filled 2021-01-18 (×3): qty 1

## 2021-01-18 MED ORDER — HYDROMORPHONE HCL 1 MG/ML IJ SOLN
INTRAMUSCULAR | Status: AC
  Filled 2021-01-18: qty 1

## 2021-01-18 MED ORDER — KCL IN DEXTROSE-NACL 20-5-0.45 MEQ/L-%-% IV SOLN
INTRAVENOUS | Status: DC
  Filled 2021-01-18 (×4): qty 1000

## 2021-01-18 MED ORDER — THROMBIN 5000 UNITS EX SOLR
CUTANEOUS | Status: AC
  Filled 2021-01-18: qty 5000

## 2021-01-18 MED ORDER — ONDANSETRON HCL 4 MG/2ML IJ SOLN: 4.0000 mg | Freq: Once | INTRAMUSCULAR | Status: DC | PRN

## 2021-01-18 MED ORDER — ONDANSETRON HCL 4 MG/2ML IJ SOLN
INTRAMUSCULAR | Status: AC
  Filled 2021-01-18: qty 2

## 2021-01-18 MED ORDER — ACETAMINOPHEN 325 MG PO TABS: 325.0000 mg | ORAL_TABLET | ORAL | Status: DC | PRN

## 2021-01-18 MED ORDER — SUGAMMADEX SODIUM 200 MG/2ML IV SOLN
INTRAVENOUS | Status: DC | PRN
  Administered 2021-01-18: 200 mg via INTRAVENOUS

## 2021-01-18 MED ORDER — FENTANYL CITRATE (PF) 100 MCG/2ML IJ SOLN
25.0000 ug | INTRAMUSCULAR | Status: DC | PRN
  Administered 2021-01-18: 25 ug via INTRAVENOUS
  Administered 2021-01-18 (×4): 50 ug via INTRAVENOUS

## 2021-01-18 MED ORDER — LIDOCAINE-EPINEPHRINE 1 %-1:100000 IJ SOLN
INTRAMUSCULAR | Status: DC | PRN
  Administered 2021-01-18: 10 mL

## 2021-01-18 MED ORDER — INSULIN ASPART 100 UNIT/ML ~~LOC~~ SOLN: 6.0000 [IU] | SUBCUTANEOUS | Status: DC

## 2021-01-18 MED ORDER — BUPIVACAINE HCL (PF) 0.5 % IJ SOLN
INTRAMUSCULAR | Status: DC | PRN
  Administered 2021-01-18: 10 mL

## 2021-01-18 MED ORDER — GABAPENTIN 300 MG PO CAPS
300.0000 mg | ORAL_CAPSULE | Freq: Two times a day (BID) | ORAL | Status: DC
  Administered 2021-01-18 – 2021-01-21 (×6): 300 mg via ORAL
  Filled 2021-01-18 (×6): qty 1

## 2021-01-18 MED ORDER — INSULIN ASPART 100 UNIT/ML IJ SOLN
0.0000 [IU] | Freq: Every day | INTRAMUSCULAR | Status: DC
Start: 2021-01-18 — End: 2021-01-21
  Administered 2021-01-18 – 2021-01-19 (×2): 2 [IU] via SUBCUTANEOUS

## 2021-01-18 MED ORDER — LABETALOL HCL 5 MG/ML IV SOLN: 10.0000 mg | INTRAVENOUS | Status: DC | PRN

## 2021-01-18 MED ORDER — ONDANSETRON HCL 4 MG PO TABS
4.0000 mg | ORAL_TABLET | ORAL | Status: DC | PRN
Start: 2021-01-18 — End: 2021-01-21

## 2021-01-18 MED ORDER — CHLORHEXIDINE GLUCONATE 0.12 % MT SOLN
OROMUCOSAL | Status: AC
  Administered 2021-01-18: 15 mL via OROMUCOSAL
  Filled 2021-01-18: qty 15

## 2021-01-18 MED ORDER — CEFAZOLIN SODIUM-DEXTROSE 2-4 GM/100ML-% IV SOLN
INTRAVENOUS | Status: AC
  Filled 2021-01-18: qty 100

## 2021-01-18 MED ORDER — INSULIN ASPART 100 UNIT/ML IJ SOLN
0.0000 [IU] | Freq: Three times a day (TID) | INTRAMUSCULAR | Status: DC
  Administered 2021-01-19 (×2): 3 [IU] via SUBCUTANEOUS
  Administered 2021-01-19: 2 [IU] via SUBCUTANEOUS
  Administered 2021-01-20: 3 [IU] via SUBCUTANEOUS
  Administered 2021-01-20: 5 [IU] via SUBCUTANEOUS
  Administered 2021-01-20 – 2021-01-21 (×2): 3 [IU] via SUBCUTANEOUS

## 2021-01-18 MED ORDER — ATORVASTATIN CALCIUM 40 MG PO TABS
40.0000 mg | ORAL_TABLET | Freq: Every day | ORAL | Status: DC
  Administered 2021-01-18 – 2021-01-20 (×3): 40 mg
  Filled 2021-01-18 (×3): qty 1

## 2021-01-18 MED ORDER — VALPROIC ACID 250 MG/5ML PO SOLN: 250.0000 mg | Freq: Two times a day (BID) | ORAL | Status: DC

## 2021-01-18 SURGICAL SUPPLY — 73 items
BAG COUNTER SPONGE SURGICOUNT (BAG) ×2 IMPLANT
BASKET BONE COLLECTION (BASKET) IMPLANT
BIT DRILL WIRE PASS 1.3MM (BIT) IMPLANT
BNDG GAUZE ELAST 4 BULKY (GAUZE/BANDAGES/DRESSINGS) IMPLANT
BNDG STRETCH 4X75 STRL LF (GAUZE/BANDAGES/DRESSINGS) IMPLANT
BUR ACORN 6.0 PRECISION (BURR) IMPLANT
BUR SPIRAL ROUTER 2.3 (BUR) IMPLANT
CANISTER SUCT 3000ML PPV (MISCELLANEOUS) ×2 IMPLANT
CARTRIDGE OIL MAESTRO DRILL (MISCELLANEOUS) IMPLANT
CATH ROBINSON RED A/P 12FR (CATHETERS) IMPLANT
CLIP RANEY DISP (INSTRUMENTS) ×4 IMPLANT
CLIP VESOCCLUDE MED 6/CT (CLIP) IMPLANT
DECANTER SPIKE VIAL GLASS SM (MISCELLANEOUS) ×2 IMPLANT
DERMABOND ADVANCED (GAUZE/BANDAGES/DRESSINGS) ×1
DERMABOND ADVANCED .7 DNX12 (GAUZE/BANDAGES/DRESSINGS) ×1 IMPLANT
DIFFUSER DRILL AIR PNEUMATIC (MISCELLANEOUS) ×2 IMPLANT
DRAIN PENROSE 1/2X12 LTX STRL (WOUND CARE) IMPLANT
DRAPE NEUROLOGICAL W/INCISE (DRAPES) ×2 IMPLANT
DRAPE WARM FLUID 44X44 (DRAPES) ×2 IMPLANT
DRILL WIRE PASS 1.3MM (BIT)
DRSG OPSITE POSTOP 4X6 (GAUZE/BANDAGES/DRESSINGS) ×2 IMPLANT
DRSG PAD ABDOMINAL 8X10 ST (GAUZE/BANDAGES/DRESSINGS) IMPLANT
DRSG TEGADERM 4X10 (GAUZE/BANDAGES/DRESSINGS) ×4 IMPLANT
DRSG TELFA 3X8 NADH (GAUZE/BANDAGES/DRESSINGS) ×2 IMPLANT
DURAPREP 6ML APPLICATOR 50/CS (WOUND CARE) ×2 IMPLANT
ELECT REM PT RETURN 9FT ADLT (ELECTROSURGICAL) ×2
ELECTRODE REM PT RTRN 9FT ADLT (ELECTROSURGICAL) ×1 IMPLANT
EVACUATOR SILICONE 100CC (DRAIN) IMPLANT
GAUZE 4X4 16PLY ~~LOC~~+RFID DBL (SPONGE) ×4 IMPLANT
GAUZE SPONGE 4X4 12PLY STRL (GAUZE/BANDAGES/DRESSINGS) IMPLANT
GLOVE EXAM NITRILE XL STR (GLOVE) IMPLANT
GLOVE SRG 8 PF TXTR STRL LF DI (GLOVE) ×2 IMPLANT
GLOVE SURG ENC MOIS LTX SZ8 (GLOVE) ×2 IMPLANT
GLOVE SURG LTX SZ7.5 (GLOVE) ×2 IMPLANT
GLOVE SURG LTX SZ8 (GLOVE) ×4 IMPLANT
GLOVE SURG POLYISO LF SZ8 (GLOVE) ×4 IMPLANT
GLOVE SURG UNDER POLY LF SZ8 (GLOVE) ×2
GLOVE SURG UNDER POLY LF SZ8.5 (GLOVE) ×2 IMPLANT
GOWN STRL REUS W/ TWL LRG LVL3 (GOWN DISPOSABLE) IMPLANT
GOWN STRL REUS W/ TWL XL LVL3 (GOWN DISPOSABLE) ×2 IMPLANT
GOWN STRL REUS W/TWL 2XL LVL3 (GOWN DISPOSABLE) ×2 IMPLANT
GOWN STRL REUS W/TWL LRG LVL3 (GOWN DISPOSABLE)
GOWN STRL REUS W/TWL XL LVL3 (GOWN DISPOSABLE) ×2
HEMOSTAT POWDER KIT SURGIFOAM (HEMOSTASIS) ×2 IMPLANT
HEMOSTAT SURGICEL 2X14 (HEMOSTASIS) ×2 IMPLANT
KIT BASIN OR (CUSTOM PROCEDURE TRAY) ×2 IMPLANT
KIT TURNOVER KIT B (KITS) ×2 IMPLANT
MESH DYNAMIC STD LG 1.7X120 (Mesh General) ×2 IMPLANT
NEEDLE HYPO 25X1 1.5 SAFETY (NEEDLE) ×2 IMPLANT
NS IRRIG 1000ML POUR BTL (IV SOLUTION) ×4 IMPLANT
OIL CARTRIDGE MAESTRO DRILL (MISCELLANEOUS)
PACK BATTERY CMF DISP FOR DVR (ORTHOPEDIC DISPOSABLE SUPPLIES) ×2 IMPLANT
PACK CRANIOTOMY CUSTOM (CUSTOM PROCEDURE TRAY) ×2 IMPLANT
PAD ARMBOARD 7.5X6 YLW CONV (MISCELLANEOUS) ×2 IMPLANT
PATTIES SURGICAL .5 X.5 (GAUZE/BANDAGES/DRESSINGS) IMPLANT
PATTIES SURGICAL .5 X3 (DISPOSABLE) IMPLANT
PATTIES SURGICAL 1X1 (DISPOSABLE) IMPLANT
PIN MAYFIELD SKULL DISP (PIN) IMPLANT
SCREW UNIII AXS SD 1.5X4 (Screw) ×56 IMPLANT
SPECIMEN JAR SMALL (MISCELLANEOUS) IMPLANT
SPONGE NEURO XRAY DETECT 1X3 (DISPOSABLE) IMPLANT
STAPLER SKIN PROX WIDE 3.9 (STAPLE) ×6 IMPLANT
SUT ETHILON 3 0 PS 1 (SUTURE) IMPLANT
SUT NURALON 4 0 TR CR/8 (SUTURE) ×2 IMPLANT
SUT VIC AB 2-0 CP2 18 (SUTURE) ×6 IMPLANT
SUT VIC AB 2-0 CT1 18 (SUTURE) ×4 IMPLANT
SUT VIC AB 3-0 SH 8-18 (SUTURE) ×6 IMPLANT
SYR CONTROL 10ML LL (SYRINGE) IMPLANT
TOWEL GREEN STERILE (TOWEL DISPOSABLE) ×2 IMPLANT
TOWEL GREEN STERILE FF (TOWEL DISPOSABLE) ×2 IMPLANT
TRAY FOLEY MTR SLVR 16FR STAT (SET/KITS/TRAYS/PACK) IMPLANT
UNDERPAD 30X36 HEAVY ABSORB (UNDERPADS AND DIAPERS) IMPLANT
WATER STERILE IRR 1000ML POUR (IV SOLUTION) ×2 IMPLANT

## 2021-01-18 NOTE — Anesthesia Preprocedure Evaluation (Addendum)
Anesthesia Evaluation  Patient identified by MRN, date of birth, ID band Patient awake    Reviewed: Allergy & Precautions, H&P , NPO status , Patient's Chart, lab work & pertinent test results, reviewed documented beta blocker date and time   Airway Mallampati: II  TM Distance: >3 FB Neck ROM: Full    Dental no notable dental hx. (+) Poor Dentition, Dental Advisory Given   Pulmonary Current Smoker and Patient abstained from smoking.,    Pulmonary exam normal breath sounds clear to auscultation       Cardiovascular hypertension, Pt. on medications and Pt. on home beta blockers  Rhythm:Regular Rate:Normal     Neuro/Psych CVA, Residual Symptoms negative psych ROS   GI/Hepatic negative GI ROS, Neg liver ROS,   Endo/Other  diabetes, Poorly Controlled, Insulin Dependent  Renal/GU negative Renal ROS  negative genitourinary   Musculoskeletal   Abdominal   Peds  Hematology negative hematology ROS (+)   Anesthesia Other Findings   Reproductive/Obstetrics negative OB ROS                             Anesthesia Physical  Anesthesia Plan  ASA: 3  Anesthesia Plan: General   Post-op Pain Management:    Induction: Intravenous  PONV Risk Score and Plan: 2 and Ondansetron  Airway Management Planned: Oral ETT  Additional Equipment: None  Intra-op Plan:   Post-operative Plan: Extubation in OR  Informed Consent: I have reviewed the patients History and Physical, chart, labs and discussed the procedure including the risks, benefits and alternatives for the proposed anesthesia with the patient or authorized representative who has indicated his/her understanding and acceptance.     Dental advisory given  Plan Discussed with: CRNA and Anesthesiologist  Anesthesia Plan Comments:       Anesthesia Quick Evaluation

## 2021-01-18 NOTE — Op Note (Signed)
01/18/2021  10:32 AM  PATIENT:  Brandi Myers  35 y.o. female  PRE-OPERATIVE DIAGNOSIS:  Status post decompressive craniectomy with skull defect  POST-OPERATIVE DIAGNOSIS:  Status post decompressive craniectomy with skull defect  PROCEDURE:  Procedure(s): Replacement of right craniectomy skull flap from right abdomen (N/A)  SURGEON:  Surgeon(s) and Role:    Maeola Harman, MD - Primary    * Dawley, Alan Mulder, DO - Assisting  PHYSICIAN ASSISTANT:   ASSISTANTS: Poteat, RN   ANESTHESIA:   general  EBL:  100 mL   BLOOD ADMINISTERED:none  DRAINS: none   LOCAL MEDICATIONS USED:  MARCAINE    and LIDOCAINE   SPECIMEN:  No Specimen  DISPOSITION OF SPECIMEN:  N/A  COUNTS:  YES  TOURNIQUET:  * No tourniquets in log *  DICTATION: Patient is 35 year old woman who had a prior decompressive craniectomy for right hemispheric infarct with bone flap in right abdomen.  It was elected to take her to surgery for cranioplasty of skull defect with replacement of right abdominal bone flap.  Procedure:  Patient was intubated and was placed in right semi-lateral position with blanket roll and head was placed on donut head holder and left frontal scalp was shaved and prepped and draped in usual sterile fashion, as was her abdomen on the right.  Area of planned incision was infiltrated with lidocaine. A curvilinear incision was made and carried through temporalis fascia and muscle to expose calvarium and margins of prior craniectomy.  Dural covering remaining intact and the brain was pulsatile.  The right abdominal incision was reopened and the bone flap was retrieved.  This was replaced over the craniectomy defect with supplemental titanium mesh.  The abdominal incision was closed with 2-0 and 3-0 vicryl sutures.    The galea was closed with 2-0 vicryl stitches and the skin was re approximated with staples. Sterile occlusive dressings were placed.  Patient was extubated in the OR and taken to Recovery  in stable and satisfactory condition having tolerated his surgery well. Counts were correct at the end of the case.   PLAN OF CARE: Admit to inpatient   PATIENT DISPOSITION:  PACU - hemodynamically stable.   Delay start of Pharmacological VTE agent (>24hrs) due to surgical blood loss or risk of bleeding: yes

## 2021-01-18 NOTE — Interval H&P Note (Signed)
History and Physical Interval Note:  01/18/2021 7:35 AM  Brandi Myers  has presented today for surgery, with the diagnosis of Status post craniectomy.  The various methods of treatment have been discussed with the patient and family. After consideration of risks, benefits and other options for treatment, the patient has consented to  Procedure(s) with comments: Replacement of right craniectomy skull flap from right abdomen (N/A) - RM 21 as a surgical intervention.  The patient's history has been reviewed, patient examined, no change in status, stable for surgery.  I have reviewed the patient's chart and labs.  Questions were answered to the patient's satisfaction.     Dorian Heckle

## 2021-01-18 NOTE — Brief Op Note (Signed)
01/18/2021  10:32 AM  PATIENT:  Brandi Myers  34 y.o. female  PRE-OPERATIVE DIAGNOSIS:  Status post decompressive craniectomy with skull defect  POST-OPERATIVE DIAGNOSIS:  Status post decompressive craniectomy with skull defect  PROCEDURE:  Procedure(s): Replacement of right craniectomy skull flap from right abdomen (N/A)  SURGEON:  Surgeon(s) and Role:    * Faith Branan, MD - Primary    * Dawley, Troy C, DO - Assisting  PHYSICIAN ASSISTANT:   ASSISTANTS: Poteat, RN   ANESTHESIA:   general  EBL:  100 mL   BLOOD ADMINISTERED:none  DRAINS: none   LOCAL MEDICATIONS USED:  MARCAINE    and LIDOCAINE   SPECIMEN:  No Specimen  DISPOSITION OF SPECIMEN:  N/A  COUNTS:  YES  TOURNIQUET:  * No tourniquets in log *  DICTATION: Patient is 34 year old woman who had a prior decompressive craniectomy for right hemispheric infarct with bone flap in right abdomen.  It was elected to take her to surgery for cranioplasty of skull defect with replacement of right abdominal bone flap.  Procedure:  Patient was intubated and was placed in right semi-lateral position with blanket roll and head was placed on donut head holder and left frontal scalp was shaved and prepped and draped in usual sterile fashion, as was her abdomen on the right.  Area of planned incision was infiltrated with lidocaine. A curvilinear incision was made and carried through temporalis fascia and muscle to expose calvarium and margins of prior craniectomy.  Dural covering remaining intact and the brain was pulsatile.  The right abdominal incision was reopened and the bone flap was retrieved.  This was replaced over the craniectomy defect with supplemental titanium mesh.  The abdominal incision was closed with 2-0 and 3-0 vicryl sutures.    The galea was closed with 2-0 vicryl stitches and the skin was re approximated with staples. Sterile occlusive dressings were placed.  Patient was extubated in the OR and taken to Recovery  in stable and satisfactory condition having tolerated his surgery well. Counts were correct at the end of the case.   PLAN OF CARE: Admit to inpatient   PATIENT DISPOSITION:  PACU - hemodynamically stable.   Delay start of Pharmacological VTE agent (>24hrs) due to surgical blood loss or risk of bleeding: yes  

## 2021-01-18 NOTE — Progress Notes (Signed)
Subjective: Patient reports sore in head  Objective: Vital signs in last 24 hours: Temp:  [97.4 F (36.3 C)-98.7 F (37.1 C)] 97.4 F (36.3 C) (10/07 1020) Pulse Rate:  [90-98] 95 (10/07 1035) Resp:  [17-18] 18 (10/07 1035) BP: (123-136)/(68-104) 136/92 (10/07 1035) SpO2:  [94 %-100 %] 94 % (10/07 1035) Weight:  [74.4 kg] 74.4 kg (10/07 0550)  Intake/Output from previous day: No intake/output data recorded. Intake/Output this shift: Total I/O In: 1050 [I.V.:1050] Out: 100 [Blood:100]  Physical Exam: Awake, alert, conversant.  Dressing CDI.  Left hemiplegia stable from preop.  Following commands on right.  Lab Results: No results for input(s): WBC, HGB, HCT, PLT in the last 72 hours. BMET No results for input(s): NA, K, CL, CO2, GLUCOSE, BUN, CREATININE, CALCIUM in the last 72 hours.  Studies/Results: No results found.  Assessment/Plan: Patient is doing well following right cranioplasty.    LOS: 0 days    Dorian Heckle, MD 01/18/2021, 10:49 AM

## 2021-01-18 NOTE — Transfer of Care (Signed)
Immediate Anesthesia Transfer of Care Note  Patient: Brandi Myers  Procedure(s) Performed: Replacement of right craniectomy skull flap from right abdomen  Patient Location: PACU  Anesthesia Type:General  Level of Consciousness: awake and oriented  Airway & Oxygen Therapy: Patient Spontanous Breathing  Post-op Assessment: Report given to RN  Post vital signs: Reviewed and stable  Last Vitals:  Vitals Value Taken Time  BP 136/104 01/18/21 1019  Temp    Pulse 102 01/18/21 1022  Resp 15 01/18/21 1022  SpO2 99 % 01/18/21 1022  Vitals shown include unvalidated device data.  Last Pain:  Vitals:   01/18/21 0641  TempSrc:   PainSc: 8       Patients Stated Pain Goal: 2 (01/18/21 0641)  Complications: No notable events documented.

## 2021-01-18 NOTE — Anesthesia Postprocedure Evaluation (Signed)
Anesthesia Post Note  Patient: Brandi Myers  Procedure(s) Performed: Replacement of right craniectomy skull flap from right abdomen     Patient location during evaluation: PACU Anesthesia Type: General Level of consciousness: awake and alert Pain management: pain level controlled Vital Signs Assessment: post-procedure vital signs reviewed and stable Respiratory status: spontaneous breathing, nonlabored ventilation, respiratory function stable and patient connected to nasal cannula oxygen Cardiovascular status: blood pressure returned to baseline and stable Postop Assessment: no apparent nausea or vomiting Anesthetic complications: no   No notable events documented.  Last Vitals:  Vitals:   01/18/21 1350 01/18/21 1420  BP: 122/85 130/88  Pulse: (!) 109 96  Resp: 15 17  Temp:    SpO2: 98% 96%    Last Pain:  Vitals:   01/18/21 1350  TempSrc:   PainSc: Asleep                 Warnell Rasnic

## 2021-01-18 NOTE — Anesthesia Procedure Notes (Signed)
Procedure Name: Intubation Date/Time: 01/18/2021 7:51 AM Performed by: Barrington Ellison, CRNA Pre-anesthesia Checklist: Patient identified, Emergency Drugs available, Suction available and Patient being monitored Patient Re-evaluated:Patient Re-evaluated prior to induction Oxygen Delivery Method: Circle System Utilized Preoxygenation: Pre-oxygenation with 100% oxygen Induction Type: IV induction Ventilation: Mask ventilation without difficulty Laryngoscope Size: Mac and 3 Grade View: Grade I Tube type: Oral Tube size: 7.5 mm Number of attempts: 1 Airway Equipment and Method: Stylet and Oral airway Placement Confirmation: ETT inserted through vocal cords under direct vision, positive ETCO2 and breath sounds checked- equal and bilateral Secured at: 21 cm Tube secured with: Tape Dental Injury: Teeth and Oropharynx as per pre-operative assessment

## 2021-01-19 LAB — GLUCOSE, CAPILLARY
Glucose-Capillary: 173 mg/dL — ABNORMAL HIGH (ref 70–99)
Glucose-Capillary: 142 mg/dL — ABNORMAL HIGH (ref 70–99)
Glucose-Capillary: 188 mg/dL — ABNORMAL HIGH (ref 70–99)
Glucose-Capillary: 206 mg/dL — ABNORMAL HIGH (ref 70–99)

## 2021-01-19 MED ORDER — PANTOPRAZOLE SODIUM 40 MG PO TBEC
40.0000 mg | DELAYED_RELEASE_TABLET | Freq: Every day | ORAL | Status: DC
  Administered 2021-01-19 – 2021-01-20 (×2): 40 mg via ORAL
  Filled 2021-01-19 (×2): qty 1

## 2021-01-19 NOTE — Progress Notes (Signed)
   Providing Compassionate, Quality Care - Together  NEUROSURGERY PROGRESS NOTE   S: No issues overnight.   O: EXAM:  BP 118/87 (BP Location: Right Arm)   Pulse 97   Temp 98.2 F (36.8 C) (Axillary)   Resp 20   Ht 5\' 5"  (1.651 m)   Wt 74.4 kg   LMP 01/13/2021   SpO2 94%   BMI 27.29 kg/m   Awake, alert PERRL Speech fluent, appropriate  CNs grossly intact  Left hemiparesis Incisions c.d.I, appropriate periorbital swelling expected  ASSESSMENT:  35 y.o. female with   R Cranioplasty   PLAN: - pain control - pt/ot    Thank you for allowing me to participate in this patient's care.  Please do not hesitate to call with questions or concerns.   31, DO Neurosurgeon [facility] Cell: [phone]

## 2021-01-20 LAB — GLUCOSE, CAPILLARY
Glucose-Capillary: 191 mg/dL — ABNORMAL HIGH (ref 70–99)
Glucose-Capillary: 217 mg/dL — ABNORMAL HIGH (ref 70–99)
Glucose-Capillary: 181 mg/dL — ABNORMAL HIGH (ref 70–99)
Glucose-Capillary: 192 mg/dL — ABNORMAL HIGH (ref 70–99)
Glucose-Capillary: 190 mg/dL — ABNORMAL HIGH (ref 70–99)

## 2021-01-20 MED ORDER — LEVETIRACETAM 500 MG PO TABS
500.0000 mg | ORAL_TABLET | Freq: Two times a day (BID) | ORAL | Status: DC
  Administered 2021-01-20 – 2021-01-21 (×2): 500 mg via ORAL
  Filled 2021-01-20 (×2): qty 1

## 2021-01-20 NOTE — Progress Notes (Signed)
   Providing Compassionate, Quality Care - Together  NEUROSURGERY PROGRESS NOTE   S: No issues overnight. Some periincisional pain  O: EXAM:  BP 116/82 (BP Location: Left Arm)   Pulse 100   Temp 98.6 F (37 C) (Oral)   Resp 10   Ht 5\' 5"  (1.651 m)   Wt 74.4 kg   LMP 01/13/2021   SpO2 97%   BMI 27.29 kg/m   Awake, alert, oriented BL periorbital swelling Speech fluent, appropriate  CNs grossly intact  Left hemiparesis Incisions c.d.I, appropriate periorbital swelling expected   ASSESSMENT:  35 y.o. female with    R Cranioplasty     PLAN: - pain control - pt/ot - ice pack for swelling      Thank you for allowing me to participate in this patient's care.  Please do not hesitate to call with questions or concerns.   31, DO Neurosurgeon [facility] Cell: [phone]

## 2021-01-21 ENCOUNTER — Encounter (HOSPITAL_COMMUNITY): Payer: Self-pay | Admitting: Neurosurgery

## 2021-01-21 LAB — GLUCOSE, CAPILLARY
Glucose-Capillary: 126 mg/dL — ABNORMAL HIGH (ref 70–99)
Glucose-Capillary: 168 mg/dL — ABNORMAL HIGH (ref 70–99)
Glucose-Capillary: 200 mg/dL — ABNORMAL HIGH (ref 70–99)

## 2021-01-21 NOTE — Discharge Summary (Signed)
Physician Discharge Summary  Patient ID: Brandi Myers MRN: [id number] DOB/AGE: [birth year] 35 y.o.  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  She did well with cranioplasty and was having adequate pain management. She was discharged home on POD 3.  Consults: None  Significant Diagnostic Studies: None  Treatments: surgery: cranioplasty with right abdominal bone flap after she had recovered following decompressive craniectomy for pan-hemispheric right sided stroke  Discharge Exam: Blood pressure 118/74, pulse 93, temperature 97.9 F (36.6 C), temperature source Axillary, resp. rate 18, height 5\' 5"  (1.651 m), weight 74.4 kg, last menstrual period 01/13/2021, SpO2 98 %. Neurologic: Motor: Stable left hemi-paresis  Disposition: Home  Patient should restart Eliquis and Plavix one week post-op.  Return to office for staple removal 14 days postop    Discharge Instructions      Remove dressing in 72 hours   Complete by: As directed    Diet - low sodium heart healthy   Complete by: As directed    Diet general   Complete by: As directed    Increase activity slowly   Complete by: As directed       Allergies as of 01/21/2021       Reactions   Norco [hydrocodone-acetaminophen] Itching, Nausea And Vomiting, Swelling        Medication List     STOP taking these medications    apixaban 5 MG Tabs tablet Commonly known as: ELIQUIS   clopidogrel 75 MG tablet Commonly known as: Plavix       TAKE these medications    acetaminophen 325 MG tablet Commonly known as: TYLENOL Take 2 tablets (650 mg total) by mouth every 4 (four) hours as  needed for mild pain (or temp > 37.5 C (99.5 F)).   albuterol 108 (90 Base) MCG/ACT inhaler Commonly known as: VENTOLIN HFA Inhale 1-2 puffs into the lungs every 6 (six) hours as needed for wheezing or shortness of breath.   aspirin EC 81 MG tablet Take 81 mg by mouth daily. Swallow whole.   atorvastatin 40 MG tablet Commonly known as: LIPITOR Place 1 tablet (40 mg total) into feeding tube daily at 6 PM. What changed:  how to take this when to take this   Benzamycin gel Generic drug: benzoyl peroxide-erythromycin Apply 1 application topically in the morning and at bedtime.   Depakene 250 MG capsule Generic drug: valproic acid Take 250 mg by mouth 2 (two) times daily.   gabapentin 300 MG capsule Commonly known as: NEURONTIN Take 300 mg by mouth 2 (two) times daily.   insulin aspart 100 UNIT/ML injection Commonly known as: novoLOG Inject 0-15 Units into the skin every 4 (four) hours. Correction coverage: Moderate (average weight, post-op)  CBG < 70: Implement Hypoglycemia Standing Orders and refer to Hypoglycemia Standing Orders sidebar report  CBG 70 - 120: 0 units  CBG 121 - 150: 2 units  CBG 151 - 200: 3 units  CBG 201 - 250: 5 units  CBG 251 - 300: 8 units  CBG 301 - 350: 11 units  CBG 351 - 400: 15 units  CBG > 400 call MD and obtain STAT lab verification   insulin aspart 100 UNIT/ML injection Commonly known as: novoLOG Inject 6 Units into  the skin every 4 (four) hours.   insulin detemir 100 UNIT/ML injection Commonly known as: LEVEMIR Inject 0.35 mLs (35 Units total) into the skin 2 (two) times daily. What changed:  how much to take when to take this   melatonin 3 MG Tabs tablet Place 1 tablet (3 mg total) into feeding tube at bedtime. What changed: how to take this   metoprolol tartrate 50 MG tablet Commonly known as: LOPRESSOR Place 1 tablet (50 mg total) into feeding tube 2 (two) times daily. What changed: how to take this   multivitamin with  minerals Tabs tablet Place 1 tablet into feeding tube daily. What changed: how to take this   Retin-A 0.025 % cream Generic drug: tretinoin Apply 1 application topically at bedtime.   sertraline 25 MG tablet Commonly known as: ZOLOFT Place 1 tablet (25 mg total) into feeding tube daily. What changed: how to take this   Trulicity 0.75 MG/0.5ML Sopn Generic drug: Dulaglutide Inject 0.75 mg into the skin every Saturday.         Signed: Dorian Heckle, MD 01/21/2021, 8:25 AM

## 2021-01-21 NOTE — Progress Notes (Signed)
Subjective: Patient reports doing OK  Objective: Vital signs in last 24 hours: Temp:  [97.8 F (36.6 C)-98.6 F (37 C)] 97.9 F (36.6 C) (10/10 0444) Pulse Rate:  [87-104] 93 (10/10 0444) Resp:  [10-20] 18 (10/10 0444) BP: (81-118)/(68-82) 118/74 (10/10 0444) SpO2:  [95 %-98 %] 98 % (10/10 0444)  Intake/Output from previous day: 10/09 0701 - 10/10 0700 In: 1004.6 [P.O.:840; I.V.:164.6] Out: 1475 [Urine:1475] Intake/Output this shift: No intake/output data recorded.  Physical Exam: Awake, alert, conversant.  Dressings CDI.  At baseline  Lab Results: No results for input(s): WBC, HGB, HCT, PLT in the last 72 hours. BMET No results for input(s): NA, K, CL, CO2, GLUCOSE, BUN, CREATININE, CALCIUM in the last 72 hours.  Studies/Results: No results found.  Assessment/Plan: Discharge home    LOS: 3 days    Dorian Heckle, MD 01/21/2021, 8:24 AM

## 2021-01-29 ENCOUNTER — Encounter (HOSPITAL_COMMUNITY): Payer: Self-pay | Admitting: Neurosurgery

## 2021-01-29 MED ORDER — THROMBIN 5000 UNITS EX SOLR
OROMUCOSAL | Status: DC | PRN
  Administered 2021-01-29: 5 mL via TOPICAL

## 2021-01-29 MED FILL — Thrombin For Soln 5000 Unit: CUTANEOUS | Qty: 5000 | Status: AC

## 2021-01-29 NOTE — OR Nursing (Signed)
Documented correction on thrombin 5000 unit used not 20000unit during procedure

## 2021-02-05 ENCOUNTER — Telehealth: Payer: Self-pay | Admitting: Adult Health

## 2021-02-05 NOTE — Telephone Encounter (Signed)
I called the pts mom back and advised of message. She verbalized understanding and will double check with Dr. Rush Farmer office.

## 2021-02-05 NOTE — Telephone Encounter (Signed)
Please call patients mother Brandi Myers [facility] to advise when the pt can restart her blood thinners. Appt needed to be changed so they don't want her to be off too long if needed. She stated Sr. [name] stopped due to surgery. Thank you

## 2021-02-05 NOTE — Telephone Encounter (Signed)
Please advise 

## 2021-02-05 NOTE — Telephone Encounter (Signed)
Restart blood thinners if held for surgical procedure typically immediately after but would advise her to follow up with her surgeon regarding recommendation as this also depends on type of surgery

## 2021-02-11 ENCOUNTER — Ambulatory Visit: Payer: Medicaid Other | Admitting: Adult Health

## 2021-02-18 NOTE — Telephone Encounter (Signed)
Contacted pt mother back, reinformed her of the message from [place] stating she should FU with the surgeon regarding when to restart the blood thinner. Also informed her that we did not order xrays for her, she said she had the dr mixed up and will contact the appropriate one.  Mother was appreciative for the call back.

## 2021-02-18 NOTE — Telephone Encounter (Signed)
Pt's mother, Vena Rua (on [place]) called, when to restart her blood thinners. Also have not heard any thing about the results of her xray. Would like a call from the nurse.  Contact [phone]

## 2021-03-05 ENCOUNTER — Ambulatory Visit: Payer: Medicaid Other | Admitting: Adult Health

## 2021-03-19 ENCOUNTER — Ambulatory Visit: Payer: Medicaid Other | Admitting: Adult Health

## 2021-10-11 IMAGING — CT CT HEAD W/O CM
4 series · 14 of 47 positions shown, 16 images · non-contrast
Comparison: CT head/cervical spine 08/04/2020.

CLINICAL DATA: Head trauma, abnormal mental status. Neck trauma,
midline tenderness. Additional provided: History of recent falls at
[HOSPITAL], history of stroke. Patient reports left shoulder pain,
right-sided headache.

EXAM:
CT HEAD WITHOUT CONTRAST
CT CERVICAL SPINE WITHOUT CONTRAST
TECHNIQUE: Multidetector CT imaging of the head and cervical spine was
performed following the standard protocol without intravenous
contrast. Multiplanar CT image reconstructions of the cervical spine
were also generated.

[Series 3: head wo · axial · 0.44mm/px · z∈[+1111,+1226]mm · 6 of 33 slices shown, 8 images]
[im 5/33  brain]
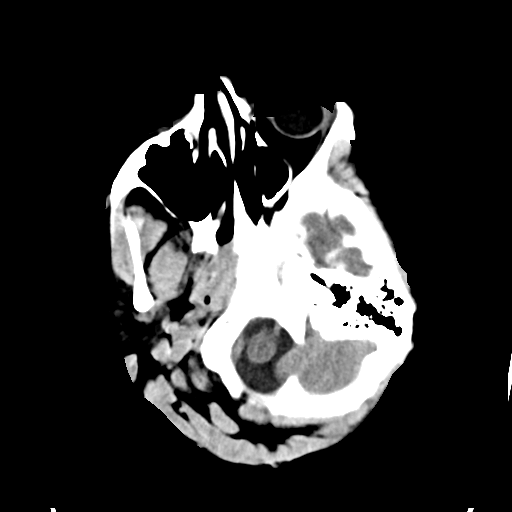
[im 5/33  bone]
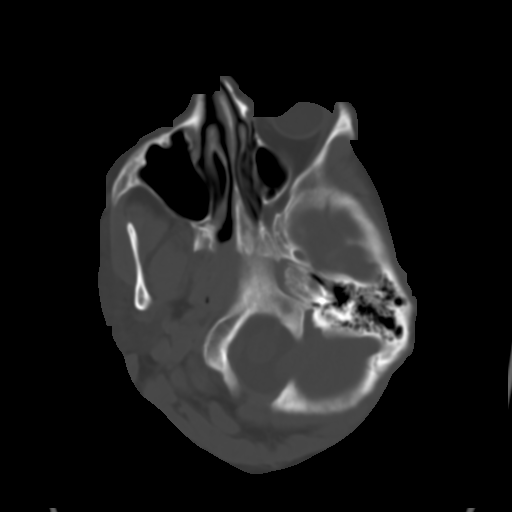
[im 10/33  brain]
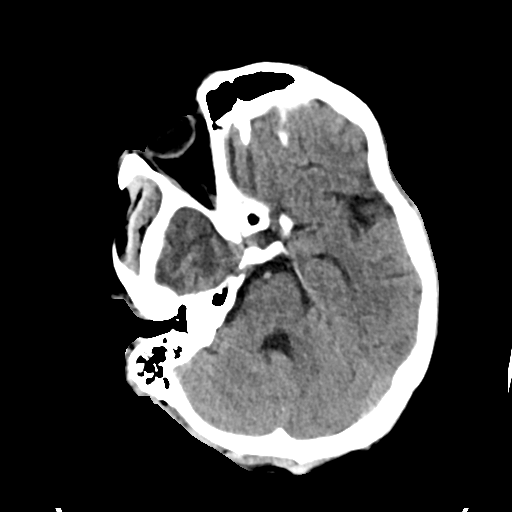
[im 14/33  brain]
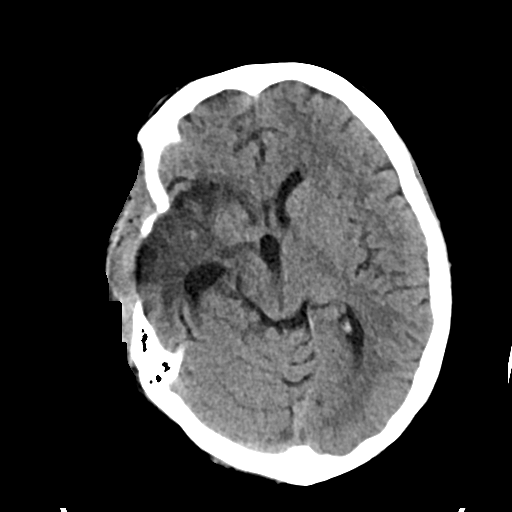
[im 19/33  brain]
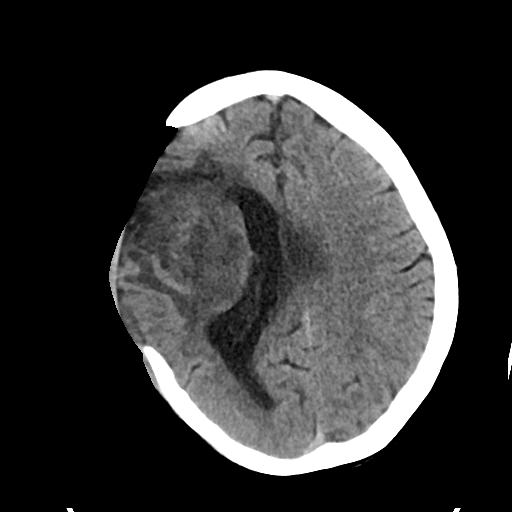
[im 23/33  brain]
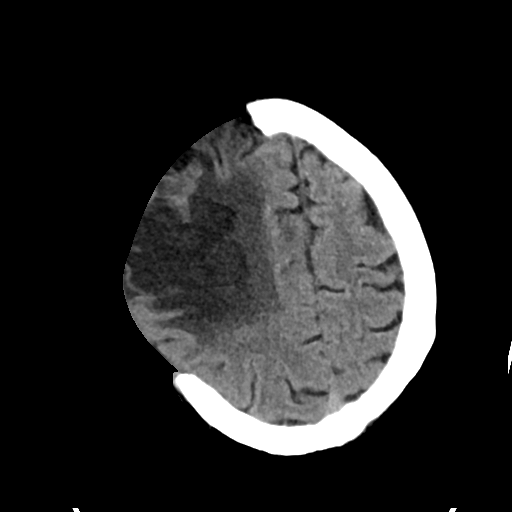
[im 23/33  bone]
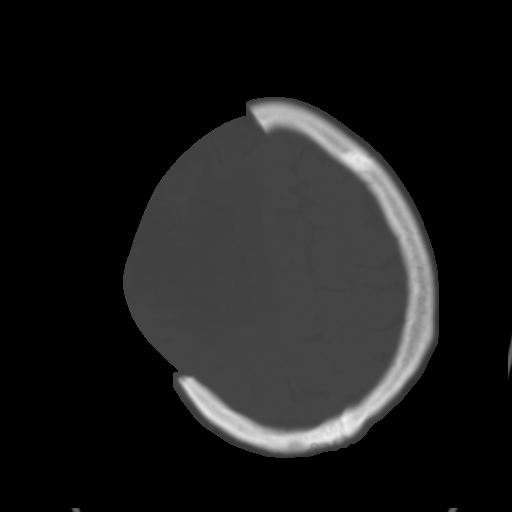
[im 28/33  brain]
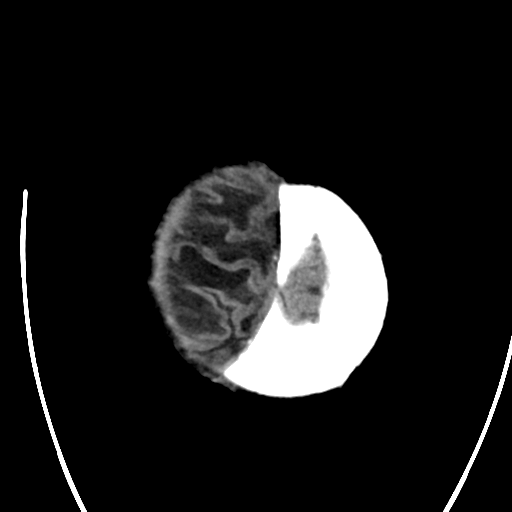

[Series 5: head bone · axial · 0.37mm/px · z∈[+1115,+1130]mm · 2 of 85 slices shown]
[im 9/85  bone]
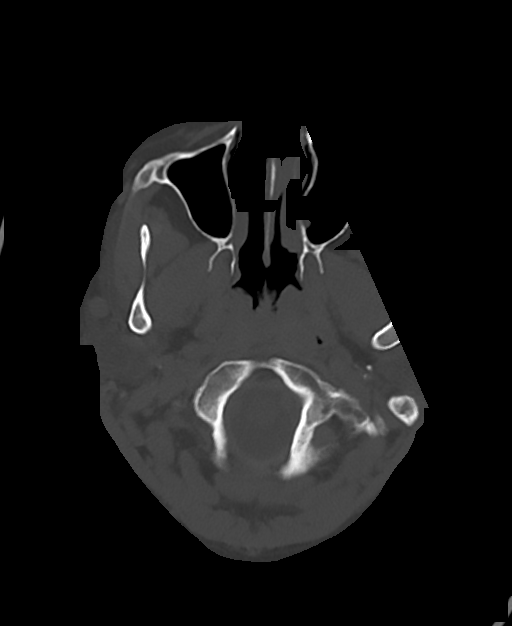
[im 17/85  bone]
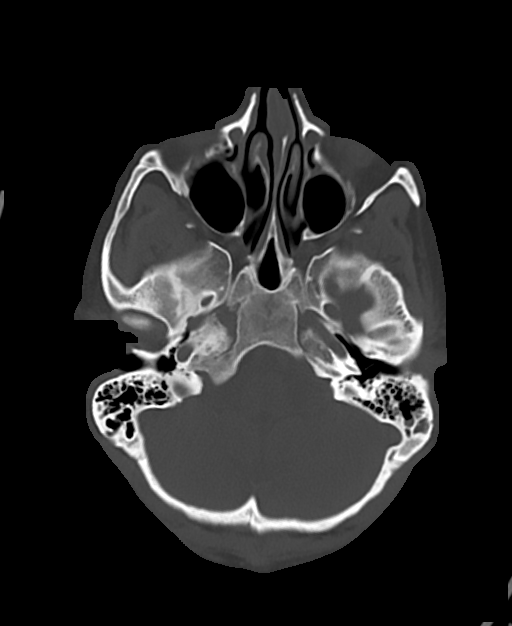

[Series 6: cor soft · coronal · 0.33mm/px · 3 of 76 slices shown]
[im 26/76  brain]
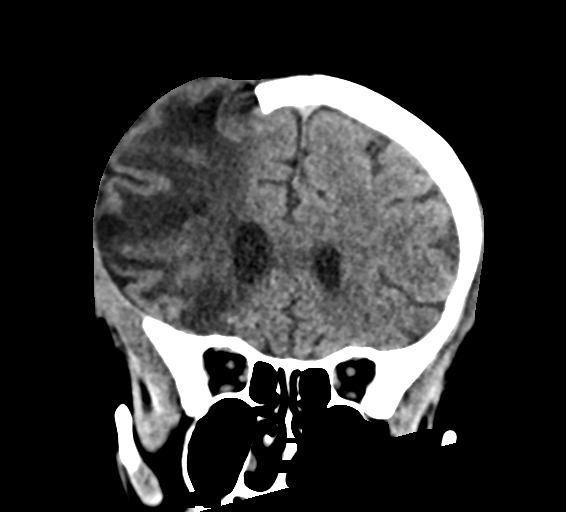
[im 34/76  brain]
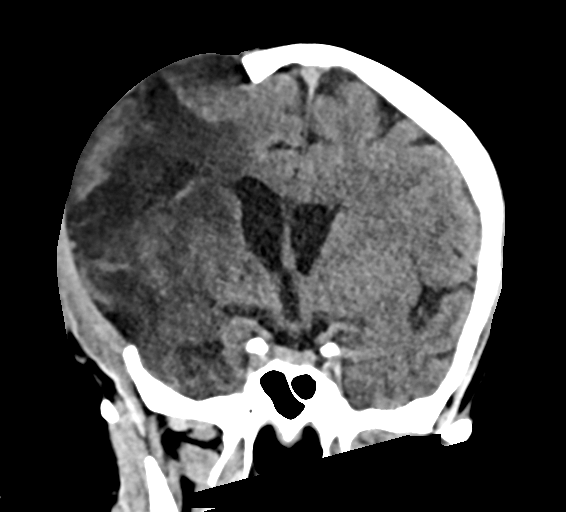
[im 42/76  brain]
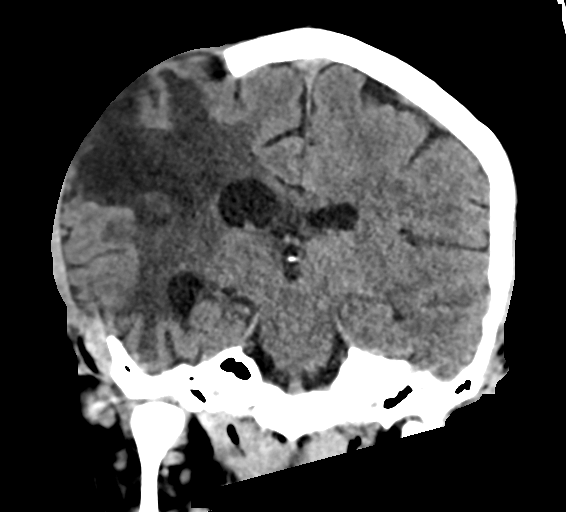

[Series 7: sag soft · sagittal · 0.33mm/px · 3 of 56 slices shown]
[im 20/56  brain]
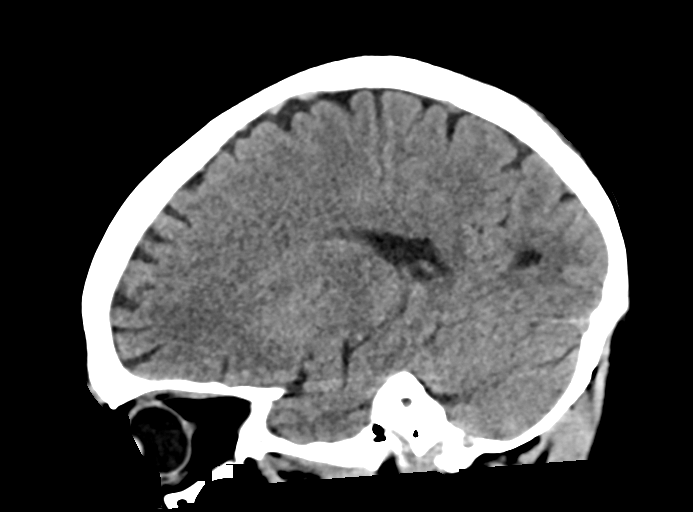
[im 28/56  brain]
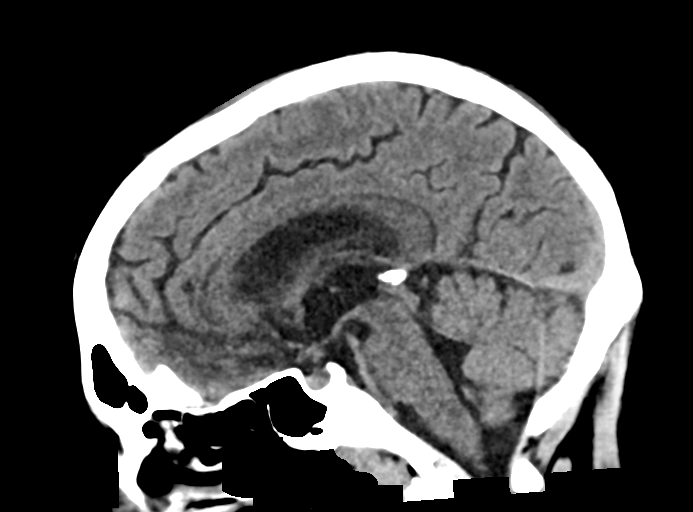
[im 36/56  brain]
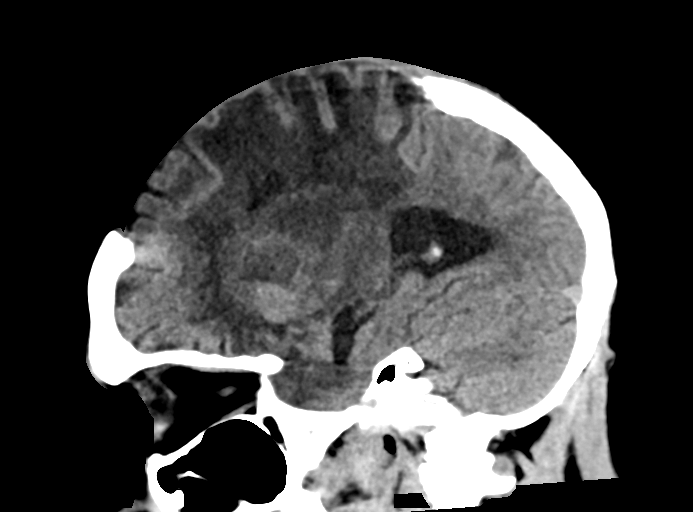

[14 of 47 positions shown; findings below may reference images not displayed]

FINDINGS: Brain:

Redemonstrated large subacute cortical/subcortical right MCA
territory infarct. Unchanged mass effect as compared to the prior
examination of 08/04/2020, with similar bulging of the brain through
the right craniectomy defect. No significant midline shift. Ex vacuo
dilatation of the right lateral ventricle.

There is no acute intracranial hemorrhage.

No acute demarcated cortical infarct.

No extra-axial fluid collection.

No evidence of intracranial mass.

No midline shift.

Vascular: No hyperdense vessel.

Skull: Prior decompressive right hemicraniectomy. No calvarial
fracture.

Sinuses/Orbits: Visualized orbits show no acute finding. Small
bilateral maxillary sinus mucous retention cysts. Minimal bilateral
ethmoid sinus mucosal thickening.

Other: Trace bilateral mastoid effusions.

CT CERVICAL SPINE FINDINGS

Alignment: Rightward rotation of C1 upon C2. Mild cervical
dextrocurvature. Reversal of the expected cervical lordosis. No
significant spondylolisthesis.

Skull base and vertebrae: The basion-dental and atlanto-dental
intervals are maintained.No evidence of acute fracture to the
cervical spine.

Soft tissues and spinal canal: No prevertebral fluid or swelling. No
visible canal hematoma.

Disc levels: No significant bony spinal canal or neural foraminal
narrowing at any level.

Upper chest: No consolidation within the imaged lung apices. No
visible pneumothorax.

Other: 9 mm right thyroid lobe nodule not meeting consensus criteria
for ultrasound follow-up.
IMPRESSION: CT head:

1. No evidence of acute intracranial abnormality.
2. Redemonstrated large subacute cortical/subcortical right MCA
territory infarct. Unchanged mass effect and bulging of the brain
through the right craniectomy defect.
3. Paranasal sinus disease, as described.
4. Trace fluid within the bilateral mastoid air cells.

CT cervical spine:

1. No evidence of acute fracture to the cervical spine.
2. Rightward rotation of C1 upon C2, which may be related to patient
head positioning at the time of image acquisition. However,
correlate with physical exam findings.
3. Nonspecific reversal of the expected cervical lordosis.
4. Mild cervical dextrocurvature.

## 2021-10-11 IMAGING — CR DG SHOULDER 2+V*L*
3 series · 3 of 3 positions shown · non-contrast
Comparison: None.

CLINICAL DATA: Fall with shoulder pain.

EXAM:
LEFT SHOULDER - 2+ VIEW

[shoulder grashey]
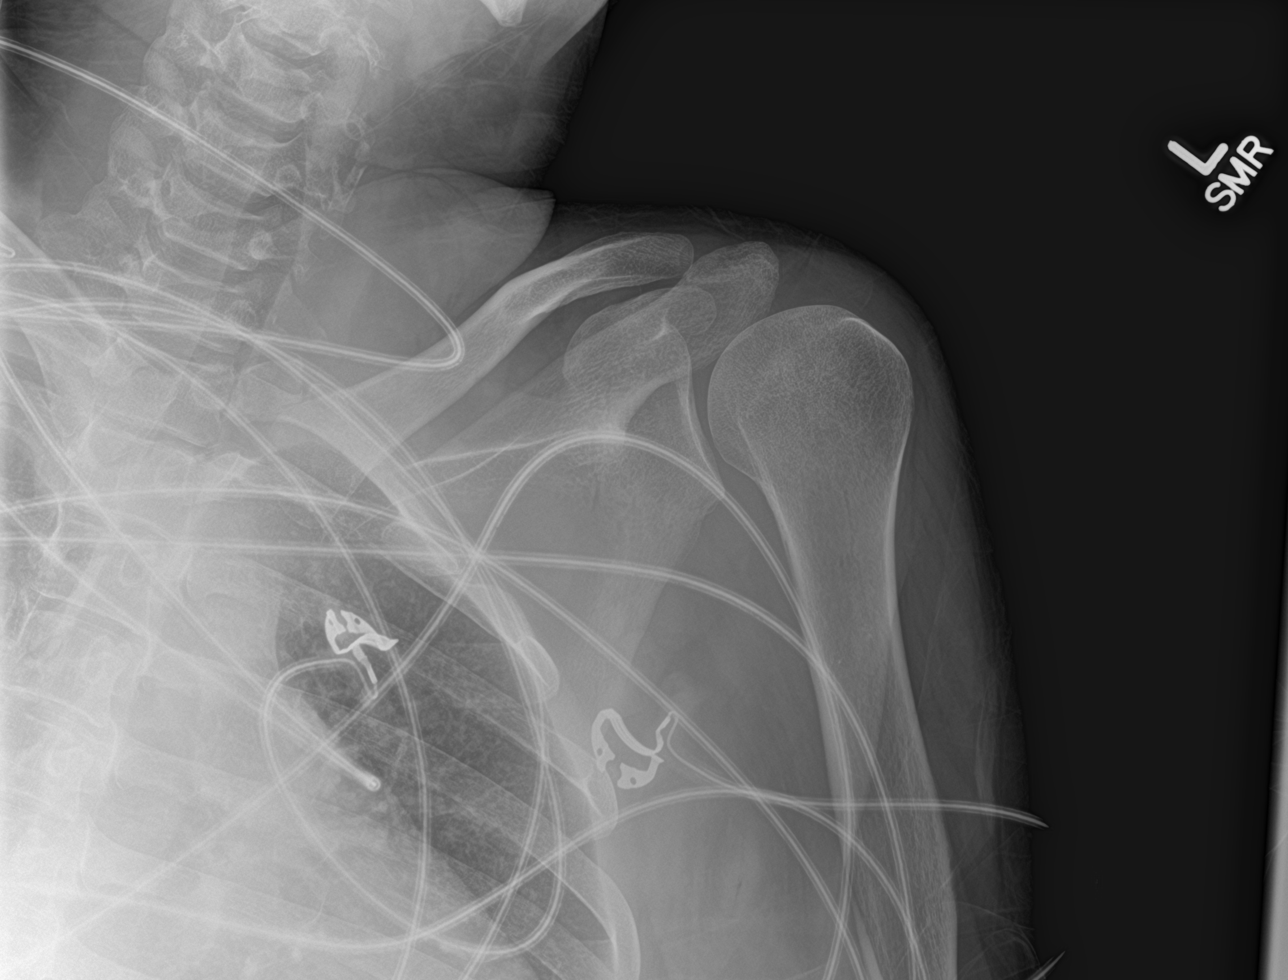

[shoulder y view]
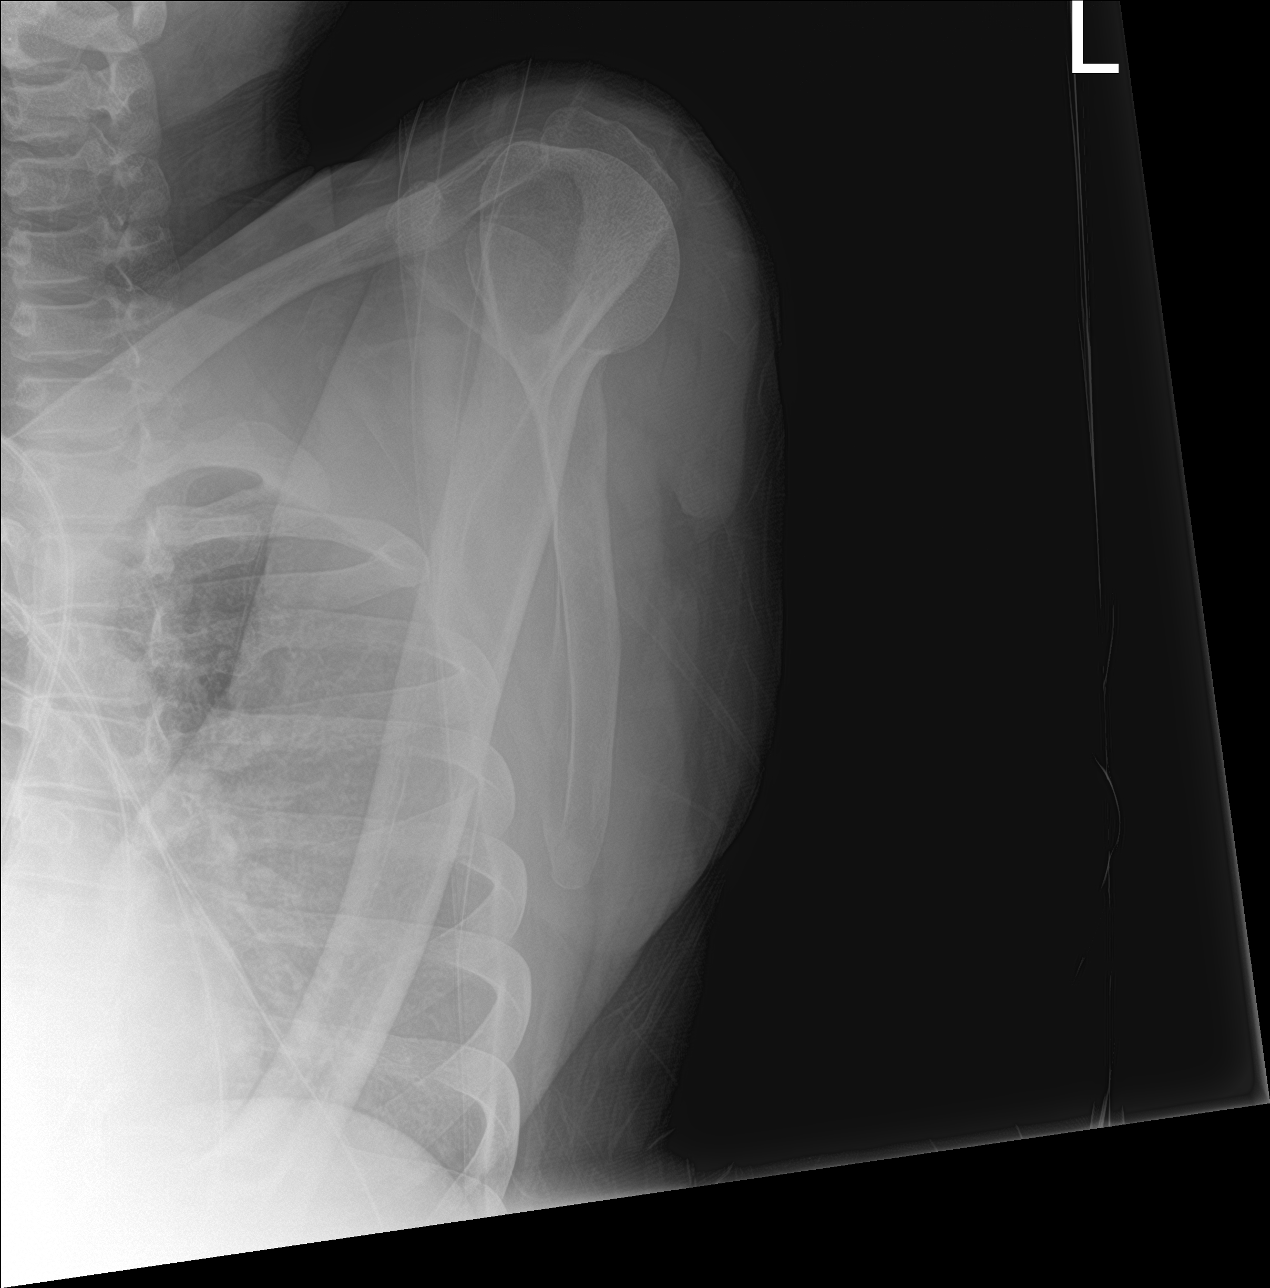

[shoulder ap neutral]
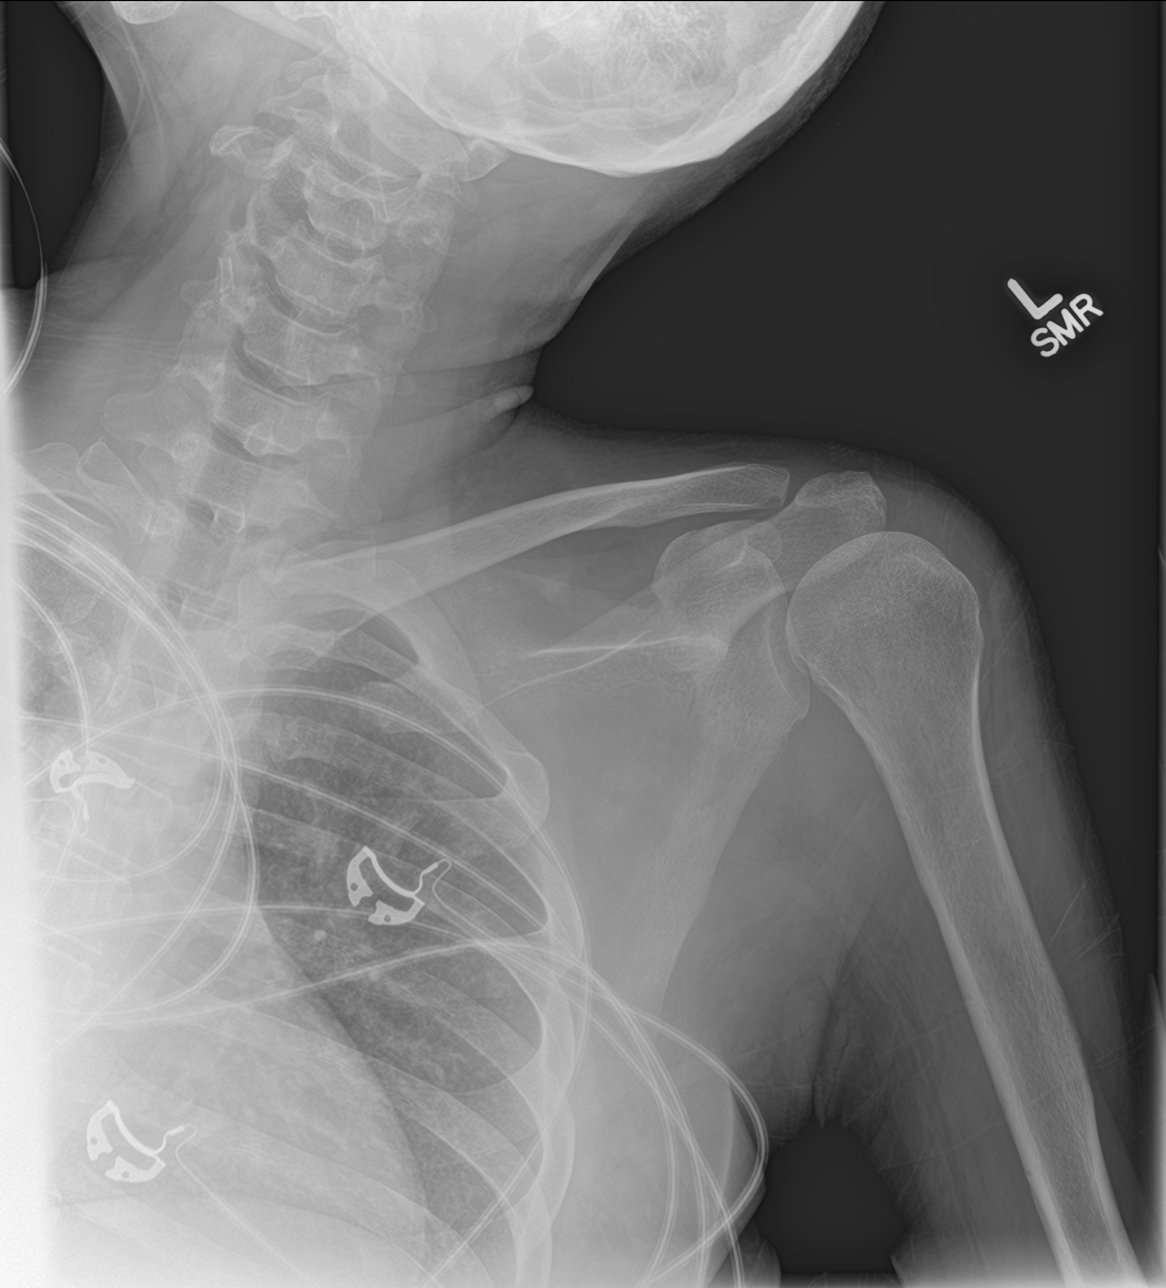

[3 of 3 positions shown; findings below may reference images not displayed]

FINDINGS: There is no evidence of fracture or dislocation. There is no
evidence of arthropathy or other focal bone abnormality. Soft
tissues are unremarkable.
IMPRESSION: Negative.

## 2021-12-07 ENCOUNTER — Encounter: Payer: Self-pay | Admitting: Emergency Medicine

## 2021-12-07 ENCOUNTER — Other Ambulatory Visit: Payer: Self-pay

## 2021-12-07 ENCOUNTER — Emergency Department
Admission: EM | Admit: 2021-12-07 | Discharge: 2021-12-07 | Discharge status: Against Medical Advice | Payer: Medicaid Other | Attending: Emergency Medicine | Admitting: Emergency Medicine

## 2021-12-07 DIAGNOSIS — Z5321 Procedure and treatment not carried out due to patient leaving prior to being seen by health care provider: Secondary | ICD-10-CM | POA: Insufficient documentation

## 2021-12-07 DIAGNOSIS — K0889 Other specified disorders of teeth and supporting structures: Secondary | ICD-10-CM | POA: Insufficient documentation

## 2021-12-07 NOTE — ED Triage Notes (Signed)
Pt reports is supposed to have all of her teeth pulled but every time she takes an antibiotic the pain comes back before she can get them pulled.

## 2021-12-07 NOTE — ED Notes (Signed)
Pt mom reported they were leaving and would return when the wait was not long. Encouraged to stay but pt refused

## 2021-12-08 ENCOUNTER — Emergency Department
Admission: EM | Admit: 2021-12-08 | Discharge: 2021-12-08 | Disposition: A | Discharge status: Home / Self Care | Payer: Medicaid Other | Attending: Emergency Medicine | Admitting: Emergency Medicine

## 2021-12-08 ENCOUNTER — Encounter: Payer: Self-pay | Admitting: Emergency Medicine

## 2021-12-08 ENCOUNTER — Other Ambulatory Visit: Payer: Self-pay

## 2021-12-08 DIAGNOSIS — K029 Dental caries, unspecified: Secondary | ICD-10-CM

## 2021-12-08 DIAGNOSIS — K0889 Other specified disorders of teeth and supporting structures: Secondary | ICD-10-CM | POA: Diagnosis present

## 2021-12-08 MED ORDER — TRAMADOL HCL 50 MG PO TABS: 50.0000 mg | ORAL_TABLET | Freq: Three times a day (TID) | ORAL | 0 refills | Status: DC | PRN

## 2021-12-08 MED ORDER — AMOXICILLIN 500 MG PO CAPS: 500.0000 mg | ORAL_CAPSULE | Freq: Three times a day (TID) | ORAL | 0 refills | Status: DC

## 2021-12-08 NOTE — Discharge Instructions (Signed)
Take antibiotic as directed.  Follow-up with one of the dental providers or your dentist next month as scheduled.  Return to the ED if necessary.  OPTIONS FOR DENTAL FOLLOW UP CARE  [place] [facility] - Local Safety Net Dental Clinics [email or web address]   [facility] [phone])  [facility] [phone])  [place] [phone])  [facility] [phone])  [facility] [phone]) This clinic caters to the indigent population and is on a lottery system. Location: [employer] of Dentistry, [employer], [address], [place] Clinic Hours: Wednesdays from 6pm - 9pm, patients seen by a lottery system. For dates, call or go to [email or web address] Services: Cleanings, fillings and simple extractions. Payment Options: DENTAL WORK IS FREE OF CHARGE. Bring proof of income or support. Best way to get seen: Arrive at 5:15 pm - this is a lottery, NOT first come/first serve, so arriving earlier will not increase your chances of being seen.     [facility] [phone] Select option 1 for emergencies   Location: [facility], [place], [address], [place] Clinic Hours: No walk-ins accepted - call the day before to schedule an appointment. Check in times are 9:30 am and 1:30 pm. Services: Simple extractions, temporary fillings, pulpectomy/pulp debridement, uncomplicated abscess drainage. Payment Options: PAYMENT IS DUE AT THE TIME OF SERVICE.  Fee is usually $100-200, additional surgical procedures (e.g. abscess drainage) may be extra. Cash, checks, Visa/MasterCard accepted.  Can file Medicaid if patient is covered for dental - patient should call case worker to check. No discount for [facility] patients. Best way to get seen: MUST call the day before  and get onto the schedule. Can usually be seen the next 1-2 days. No walk-ins accepted.     [facility] [phone]   Location: [facility], [address] [place] Clinic Hours: M, W, Th, F 8am or 1:30pm, Tues 9a or 1:30 - first come/first served. Services: Simple extractions, temporary fillings, uncomplicated abscess drainage.  You do not need to be an [facility] resident. Payment Options: PAYMENT IS DUE AT THE TIME OF SERVICE. Dental insurance, otherwise sliding scale - bring proof of income or support. Depending on income and treatment needed, cost is usually $50-200. Best way to get seen: Arrive early as it is first come/first served.     [facility] [phone]   Location: [address] Clinic Hours: Mon-Thu 8a-5p Services: Most basic dental services including extractions and fillings. Payment Options: PAYMENT IS DUE AT THE TIME OF SERVICE. Sliding scale, up to 50% off - bring proof if income or support. Medicaid with dental option accepted. Best way to get seen: Call to schedule an appointment, can usually be seen within 2 weeks OR they will try to see walk-ins - show up at 8a or 2p (you may have to wait).     [facility] [phone] [place] RESIDENTS ONLY   Location: [facility], [address], [place], [place] [address] Clinic Hours: By appointment only. Monday - Thursday 8am-5pm, Friday 8am-12pm Services: Cleanings, fillings, extractions. Payment Options: PAYMENT IS DUE AT THE TIME OF SERVICE. Cash, Visa or MasterCard. Sliding scale - $30 minimum per service. Best way to get seen: Come in to office, complete packet and make an appointment - need proof of income or support monies for each household member and proof of  [facility] residence. Usually takes about a month to get in.     [facility] [phone]   Location: [address] [place] Clinic Hours: Walk-in Urgent Care Dental Services are offered Monday-Friday mornings only. The numbers of emergencies accepted daily is limited to the number of providers available. Maximum 15 - Mondays, Wednesdays & Thursdays Maximum 10 - Tuesdays & Fridays Services: You do not need to be a [place] resident to be seen for a dental emergency. Emergencies are defined as pain, swelling, abnormal bleeding, or dental trauma. Walkins will receive x-rays if needed. NOTE: Dental cleaning is not an emergency. Payment Options: PAYMENT IS DUE AT THE TIME OF SERVICE. Minimum co-pay is $40.00 for uninsured patients. Minimum co-pay is $3.00 for Medicaid with dental coverage. Dental Insurance is accepted and must be presented at time of visit. Medicare does not cover dental. Forms of payment: Cash, credit card, checks. Best way to get seen: If not previously registered with the clinic, walk-in dental registration begins at 7:15 am and is on a first come/first serve basis. If previously registered with the clinic, call to make an appointment.     [facility] [phone] [place] RESIDENTS ONLY   Location: [address] [place], [place] Clinic Hours: Mon-Thu 10a-2p Services: Extractions only! Payment Options: FREE (donations accepted) - bring proof of income or support Best way to get seen: Call and schedule an appointment OR come at 8am on the 1st Monday of every month (except for holidays) when it is first come/first served.     [facility] [phone]   Location: [address] [place], [place] Clinic Hours:   Services, Payment Options, Best way to get seen: Call for info He could be admitted but she declined to call the coming week.  Patient message sent via

## 2021-12-08 NOTE — ED Provider Notes (Signed)
[facility] Emergency Department Provider Note     None    (approximate)   History   Dental Pain   HPI  Brandi Myers is a 36 y.o. female presents to the ED for evaluation of dental pain.  Patient reports previous antibiotic course provided by dental provider, but denies being evaluated for dental extraction.  She denies any acute fevers, chills, sweats patient denies difficulty breathing, swallowing, or controlling oral secretions.     Physical Exam   Triage Vital Signs: ED Triage Vitals  Enc Vitals Group     BP 12/08/21 1323 97/70     Pulse Rate 12/08/21 1323 (!) 127     Resp 12/08/21 1323 19     Temp 12/08/21 1323 98.4 F (36.9 C)     Temp Source 12/08/21 1323 Oral     SpO2 12/08/21 1323 100 %     Weight 12/08/21 1154 185 lb (83.9 kg)     Height 12/08/21 1154 5\' 5"  (1.651 m)     Head Circumference --      Peak Flow --      Pain Score 12/08/21 1154 6     Pain Loc --      Pain Edu? --      Excl. in GC? --     Most recent vital signs: Vitals:   12/08/21 1323  BP: 97/70  Pulse: (!) 127  Resp: 19  Temp: 98.4 F (36.9 C)  SpO2: 100%    General Awake, no distress.  HEENT NCAT. PERRL. EOMI. No rhinorrhea. Mucous membranes are moist.  Uvula is midline and tonsils are flat.  No oropharyngeal lesions appreciated.  Poor dentition noted throughout.  No focal edema.  No brawny sublingual edema is noted. CV:  Good peripheral perfusion.  RESP:  Normal effort.  ABD:  No distention.    ED Results / Procedures / Treatments   Labs (all labs ordered are listed, but only abnormal results are displayed) Labs Reviewed - No data to display   EKG    RADIOLOGY   No results found.   PROCEDURES:  Critical Care performed: No  Procedures   MEDICATIONS ORDERED IN ED: Medications - No data to display   IMPRESSION / MDM / ASSESSMENT AND PLAN / ED COURSE  I reviewed the triage vital signs and the nursing notes.                               Differential diagnosis includes, but is not limited to, dental infection, dental abscess, dental fracture, arrested dental caries, necrotizing gingivitis  Patient's presentation is most consistent with acute, uncomplicated illness.  Patient's diagnosis is consistent with dental infection secondary to dental caries. Patient will be discharged home with prescriptions for amoxicillin and Ultram (#9). Patient is to follow up with local dental provider as needed or otherwise directed. Patient is given ED precautions to return to the ED for any worsening or new symptoms.     FINAL CLINICAL IMPRESSION(S) / ED DIAGNOSES   Final diagnoses:  Dental caries  Pain due to dental caries     Rx / DC Orders   ED Discharge Orders          Ordered    amoxicillin (AMOXIL) 500 MG capsule  3 times daily        12/08/21 1335    traMADol (ULTRAM) 50 MG tablet  3 times daily PRN  12/08/21 1336             Note:  This document was prepared using Dragon voice recognition software and may include unintentional dictation errors.    Lissa Hoard, PA-C 12/09/21 2015    Pilar Jarvis, MD 12/10/21 Burna Mortimer

## 2021-12-08 NOTE — ED Triage Notes (Signed)
Pt reports needs to have her teeth removed and has taken antibiotics twice but each time her pain comes back before she can have them pulled. Pt took tylenol and ibuprofen this am.

## 2022-02-01 ENCOUNTER — Emergency Department: Payer: Medicaid Other

## 2022-02-01 ENCOUNTER — Other Ambulatory Visit: Payer: Self-pay

## 2022-02-01 ENCOUNTER — Emergency Department
Admission: EM | Admit: 2022-02-01 | Discharge: 2022-02-02 | Discharge status: Against Medical Advice | Payer: Medicaid Other | Attending: Emergency Medicine | Admitting: Emergency Medicine

## 2022-02-01 DIAGNOSIS — E119 Type 2 diabetes mellitus without complications: Secondary | ICD-10-CM | POA: Insufficient documentation

## 2022-02-01 DIAGNOSIS — Z5321 Procedure and treatment not carried out due to patient leaving prior to being seen by health care provider: Secondary | ICD-10-CM | POA: Insufficient documentation

## 2022-02-01 DIAGNOSIS — M549 Dorsalgia, unspecified: Secondary | ICD-10-CM | POA: Diagnosis present

## 2022-02-01 LAB — URINALYSIS, ROUTINE W REFLEX MICROSCOPIC
Bilirubin Urine: NEGATIVE
Glucose, UA: NEGATIVE mg/dL
Hgb urine dipstick: NEGATIVE
Ketones, ur: NEGATIVE mg/dL
Nitrite: NEGATIVE
Protein, ur: NEGATIVE mg/dL
Specific Gravity, Urine: 1.006 (ref 1.005–1.030)
pH: 5 (ref 5.0–8.0)

## 2022-02-01 LAB — COMPREHENSIVE METABOLIC PANEL
ALT: 19 U/L (ref 0–44)
AST: 19 U/L (ref 15–41)
Albumin: 3.8 g/dL (ref 3.5–5.0)
Alkaline Phosphatase: 86 U/L (ref 38–126)
Anion gap: 8 (ref 5–15)
BUN: 6 mg/dL (ref 6–20)
CO2: 23 mmol/L (ref 22–32)
Calcium: 8.8 mg/dL — ABNORMAL LOW (ref 8.9–10.3)
Chloride: 105 mmol/L (ref 98–111)
Creatinine, Ser: 0.48 mg/dL (ref 0.44–1.00)
GFR, Estimated: >60 mL/min (ref >60)
Glucose, Bld: 122 mg/dL — ABNORMAL HIGH (ref 70–99)
Potassium: 3.6 mmol/L (ref 3.5–5.1)
Sodium: 136 mmol/L (ref 135–145)
Total Bilirubin: 0.3 mg/dL (ref 0.3–1.2)
Total Protein: 7.8 g/dL (ref 6.5–8.1)

## 2022-02-01 LAB — CBC WITH DIFFERENTIAL/PLATELET
Abs Immature Granulocytes: 0.02 10*3/uL (ref 0.00–0.07)
Basophils Absolute: 0.1 10*3/uL (ref 0.0–0.1)
Basophils Relative: 1 %
Eosinophils Absolute: 0.5 10*3/uL (ref 0.0–0.5)
Eosinophils Relative: 5 %
HCT: 38.4 % (ref 36.0–46.0)
Hemoglobin: 10.5 g/dL — ABNORMAL LOW (ref 12.0–15.0)
Immature Granulocytes: 0 %
Lymphocytes Relative: 36 %
Lymphs Abs: 3.4 10*3/uL (ref 0.7–4.0)
MCH: 20.6 pg — ABNORMAL LOW (ref 26.0–34.0)
MCHC: 27.3 g/dL — ABNORMAL LOW (ref 30.0–36.0)
MCV: 75.4 fL — ABNORMAL LOW (ref 80.0–100.0)
Monocytes Absolute: 0.5 10*3/uL (ref 0.1–1.0)
Monocytes Relative: 6 %
Neutro Abs: 4.8 10*3/uL (ref 1.7–7.7)
Neutrophils Relative %: 52 %
Platelets: 281 10*3/uL (ref 150–400)
RBC: 5.09 MIL/uL (ref 3.87–5.11)
RDW: 25.1 % — ABNORMAL HIGH (ref 11.5–15.5)
WBC: 9.2 10*3/uL (ref 4.0–10.5)
nRBC: 0 % (ref 0.0–0.2)

## 2022-02-01 LAB — POC URINE PREG, ED: Preg Test, Ur: NEGATIVE

## 2022-02-01 NOTE — ED Triage Notes (Signed)
See First Nurse note. Pt did take OTC meds without relief. Pts lower back pain in chronic.

## 2022-02-01 NOTE — ED Triage Notes (Addendum)
FIRST NURSE NOTE:  Pt arrived via [facility] from home with c/o back pain, pt was found on porch with adoptive mother, pt ran out of back pain meds and called 911 for meds, mother reported to [facility] that the patient has "mental problems"  Pt c/o 10/10 back pain. Migraine and mother has not fed her or gave her her medication in 2 days.  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  CBg 129 Hx diabetes 134/74

## 2022-02-01 NOTE — ED Notes (Addendum)
Bag of prescription meds left on wheelchair, per security pt was seen getting in a car and leaving.  Attempted to call number but pt did not answer and unable to leave a message.

## 2022-02-02 ENCOUNTER — Telehealth: Payer: Self-pay | Admitting: Emergency Medicine

## 2022-02-02 NOTE — Telephone Encounter (Signed)
Female visitor that was with patient prior to leaving came to registration desk on 02/02/22 at [place] asking for the patient's medications, informed him that the medications were sent to the safe in the pharmacy and the patient would have to come and pick them up. Female visitor was unhappy and left.

## 2022-02-02 NOTE — ED Notes (Addendum)
Lu Duffel Small (Family relative - "Uncle") came to pick up pt's bag of prescriptions med from [place], this tech signed out medication per pharmacy and given to [facility]. Pt unable to pick up medication due to health issues.  02/02/22 at 1350 pm.

## 2022-06-05 ENCOUNTER — Institutional Professional Consult (permissible substitution): Payer: Medicaid Other | Admitting: Neurology

## 2022-07-02 ENCOUNTER — Institutional Professional Consult (permissible substitution): Payer: Medicaid Other | Admitting: Neurology

## 2022-07-02 ENCOUNTER — Encounter: Payer: Self-pay | Admitting: Neurology

## 2022-07-11 ENCOUNTER — Encounter: Payer: Self-pay | Admitting: Emergency Medicine

## 2022-07-11 ENCOUNTER — Emergency Department: Payer: Medicaid Other

## 2022-07-11 ENCOUNTER — Emergency Department
Admission: EM | Admit: 2022-07-11 | Discharge: 2022-08-23 | Disposition: A | Discharge status: Home / Self Care | Payer: Medicaid Other | Attending: Emergency Medicine | Admitting: Emergency Medicine

## 2022-07-11 ENCOUNTER — Other Ambulatory Visit: Payer: Self-pay

## 2022-07-11 DIAGNOSIS — R4182 Altered mental status, unspecified: Secondary | ICD-10-CM | POA: Diagnosis not present

## 2022-07-11 DIAGNOSIS — F4325 Adjustment disorder with mixed disturbance of emotions and conduct: Secondary | ICD-10-CM

## 2022-07-11 DIAGNOSIS — R451 Restlessness and agitation: Secondary | ICD-10-CM | POA: Insufficient documentation

## 2022-07-11 DIAGNOSIS — Z794 Long term (current) use of insulin: Secondary | ICD-10-CM | POA: Insufficient documentation

## 2022-07-11 DIAGNOSIS — E119 Type 2 diabetes mellitus without complications: Secondary | ICD-10-CM | POA: Insufficient documentation

## 2022-07-11 DIAGNOSIS — I10 Essential (primary) hypertension: Secondary | ICD-10-CM | POA: Insufficient documentation

## 2022-07-11 DIAGNOSIS — R519 Headache, unspecified: Secondary | ICD-10-CM | POA: Diagnosis not present

## 2022-07-11 DIAGNOSIS — Z79899 Other long term (current) drug therapy: Secondary | ICD-10-CM | POA: Diagnosis not present

## 2022-07-11 DIAGNOSIS — Z7985 Long-term (current) use of injectable non-insulin antidiabetic drugs: Secondary | ICD-10-CM | POA: Insufficient documentation

## 2022-07-11 DIAGNOSIS — R456 Violent behavior: Secondary | ICD-10-CM | POA: Diagnosis present

## 2022-07-11 DIAGNOSIS — Z8673 Personal history of transient ischemic attack (TIA), and cerebral infarction without residual deficits: Secondary | ICD-10-CM | POA: Insufficient documentation

## 2022-07-11 DIAGNOSIS — F17210 Nicotine dependence, cigarettes, uncomplicated: Secondary | ICD-10-CM | POA: Diagnosis not present

## 2022-07-11 DIAGNOSIS — Z7982 Long term (current) use of aspirin: Secondary | ICD-10-CM | POA: Diagnosis not present

## 2022-07-11 DIAGNOSIS — J45909 Unspecified asthma, uncomplicated: Secondary | ICD-10-CM | POA: Diagnosis not present

## 2022-07-11 LAB — URINE DRUG SCREEN, QUALITATIVE (ARMC ONLY)
Amphetamines, Ur Screen: NOT DETECTED
Barbiturates, Ur Screen: NOT DETECTED
Benzodiazepine, Ur Scrn: NOT DETECTED
Cannabinoid 50 Ng, Ur ~~LOC~~: POSITIVE — AB
Cocaine Metabolite,Ur ~~LOC~~: NOT DETECTED
MDMA (Ecstasy)Ur Screen: NOT DETECTED
Methadone Scn, Ur: NOT DETECTED
Opiate, Ur Screen: NOT DETECTED
Phencyclidine (PCP) Ur S: NOT DETECTED
Tricyclic, Ur Screen: POSITIVE — AB

## 2022-07-11 LAB — COMPREHENSIVE METABOLIC PANEL
ALT: 24 U/L (ref 0–44)
AST: 25 U/L (ref 15–41)
Albumin: 3.7 g/dL (ref 3.5–5.0)
Alkaline Phosphatase: 103 U/L (ref 38–126)
Anion gap: 13 (ref 5–15)
BUN: 5 mg/dL — ABNORMAL LOW (ref 6–20)
CO2: 24 mmol/L (ref 22–32)
Calcium: 8.8 mg/dL — ABNORMAL LOW (ref 8.9–10.3)
Chloride: 100 mmol/L (ref 98–111)
Creatinine, Ser: 0.58 mg/dL (ref 0.44–1.00)
GFR, Estimated: >60 mL/min (ref >60)
Glucose, Bld: 158 mg/dL — ABNORMAL HIGH (ref 70–99)
Potassium: 3 mmol/L — ABNORMAL LOW (ref 3.5–5.1)
Sodium: 137 mmol/L (ref 135–145)
Total Bilirubin: 0.5 mg/dL (ref 0.3–1.2)
Total Protein: 7.6 g/dL (ref 6.5–8.1)

## 2022-07-11 LAB — CBC WITH DIFFERENTIAL/PLATELET
Abs Immature Granulocytes: 0.03 10*3/uL (ref 0.00–0.07)
Basophils Absolute: 0 10*3/uL (ref 0.0–0.1)
Basophils Relative: 0 %
Eosinophils Absolute: 0.3 10*3/uL (ref 0.0–0.5)
Eosinophils Relative: 3 %
HCT: 33.2 % — ABNORMAL LOW (ref 36.0–46.0)
Hemoglobin: 9.4 g/dL — ABNORMAL LOW (ref 12.0–15.0)
Immature Granulocytes: 0 %
Lymphocytes Relative: 19 %
Lymphs Abs: 1.9 10*3/uL (ref 0.7–4.0)
MCH: 19.7 pg — ABNORMAL LOW (ref 26.0–34.0)
MCHC: 28.3 g/dL — ABNORMAL LOW (ref 30.0–36.0)
MCV: 69.7 fL — ABNORMAL LOW (ref 80.0–100.0)
Monocytes Absolute: 0.5 10*3/uL (ref 0.1–1.0)
Monocytes Relative: 6 %
Neutro Abs: 6.9 10*3/uL (ref 1.7–7.7)
Neutrophils Relative %: 72 %
Platelets: 302 10*3/uL (ref 150–400)
RBC: 4.76 MIL/uL (ref 3.87–5.11)
RDW: 18.8 % — ABNORMAL HIGH (ref 11.5–15.5)
Smear Review: NORMAL
WBC: 9.7 10*3/uL (ref 4.0–10.5)
nRBC: 0 % (ref 0.0–0.2)

## 2022-07-11 LAB — URINALYSIS, ROUTINE W REFLEX MICROSCOPIC
Bilirubin Urine: NEGATIVE
Glucose, UA: NEGATIVE mg/dL
Ketones, ur: NEGATIVE mg/dL
Leukocytes,Ua: NEGATIVE
Nitrite: NEGATIVE
Protein, ur: NEGATIVE mg/dL
Specific Gravity, Urine: 1.014 (ref 1.005–1.030)
pH: 5 (ref 5.0–8.0)

## 2022-07-11 LAB — SALICYLATE LEVEL: Salicylate Lvl: <7 mg/dL — ABNORMAL LOW (ref 7.0–30.0)

## 2022-07-11 LAB — CBG MONITORING, ED: Glucose-Capillary: 175 mg/dL — ABNORMAL HIGH (ref 70–99)

## 2022-07-11 LAB — ETHANOL: Alcohol, Ethyl (B): <10 mg/dL (ref <10)

## 2022-07-11 LAB — POC URINE PREG, ED: Preg Test, Ur: NEGATIVE

## 2022-07-11 LAB — ACETAMINOPHEN LEVEL: Acetaminophen (Tylenol), Serum: <10 ug/mL — ABNORMAL LOW (ref 10–30)

## 2022-07-11 MED ORDER — ASPIRIN 81 MG PO TBEC
81.0000 mg | DELAYED_RELEASE_TABLET | Freq: Every day | ORAL | Status: DC
  Administered 2022-07-12 – 2022-08-22 (×42): 81 mg via ORAL
  Filled 2022-07-11 (×42): qty 1

## 2022-07-11 MED ORDER — GABAPENTIN 300 MG PO CAPS
300.0000 mg | ORAL_CAPSULE | Freq: Two times a day (BID) | ORAL | Status: DC
  Administered 2022-07-12 – 2022-08-22 (×85): 300 mg via ORAL
  Filled 2022-07-11 (×85): qty 1

## 2022-07-11 MED ORDER — RISPERIDONE 1 MG PO TABS
0.5000 mg | ORAL_TABLET | Freq: Two times a day (BID) | ORAL | Status: DC | PRN
  Administered 2022-08-01 – 2022-08-11 (×17): 0.5 mg via ORAL
  Filled 2022-07-11 (×18): qty 1

## 2022-07-11 MED ORDER — SERTRALINE HCL 50 MG PO TABS
25.0000 mg | ORAL_TABLET | Freq: Every day | ORAL | Status: DC
  Administered 2022-07-12 – 2022-08-11 (×31): 25 mg via ORAL
  Filled 2022-07-11 (×31): qty 1

## 2022-07-11 MED ORDER — ATORVASTATIN CALCIUM 20 MG PO TABS
40.0000 mg | ORAL_TABLET | Freq: Every day | ORAL | Status: DC
  Administered 2022-07-12 – 2022-08-22 (×42): 40 mg via ORAL
  Filled 2022-07-11 (×42): qty 2

## 2022-07-11 MED ORDER — POTASSIUM CHLORIDE CRYS ER 20 MEQ PO TBCR
40.0000 meq | EXTENDED_RELEASE_TABLET | Freq: Once | ORAL | Status: AC
  Administered 2022-07-11: 40 meq via ORAL
  Filled 2022-07-11: qty 2

## 2022-07-11 MED ORDER — RISPERIDONE 0.5 MG PO TABS: 0.5000 mg | ORAL_TABLET | Freq: Two times a day (BID) | ORAL | 0 refills | Status: DC | PRN

## 2022-07-11 MED ORDER — INSULIN ASPART 100 UNIT/ML IJ SOLN
0.0000 [IU] | Freq: Three times a day (TID) | INTRAMUSCULAR | Status: DC
  Administered 2022-07-12: 2 [IU] via SUBCUTANEOUS
  Administered 2022-07-14 (×2): 3 [IU] via SUBCUTANEOUS
  Administered 2022-07-14 – 2022-07-16 (×3): 2 [IU] via SUBCUTANEOUS
  Administered 2022-07-16: 3 [IU] via SUBCUTANEOUS
  Administered 2022-07-17: 5 [IU] via SUBCUTANEOUS
  Administered 2022-07-18 (×2): 3 [IU] via SUBCUTANEOUS
  Administered 2022-07-19: 5 [IU] via SUBCUTANEOUS
  Administered 2022-07-19: 2 [IU] via SUBCUTANEOUS
  Administered 2022-07-20: 5 [IU] via SUBCUTANEOUS
  Administered 2022-07-20 (×2): 3 [IU] via SUBCUTANEOUS
  Administered 2022-07-21: 8 [IU] via SUBCUTANEOUS
  Administered 2022-07-21: 3 [IU] via SUBCUTANEOUS
  Administered 2022-07-21: 2 [IU] via SUBCUTANEOUS
  Administered 2022-07-22 (×2): 3 [IU] via SUBCUTANEOUS
  Administered 2022-07-22: 5 [IU] via SUBCUTANEOUS
  Administered 2022-07-23: 3 [IU] via SUBCUTANEOUS
  Filled 2022-07-11 (×24): qty 1

## 2022-07-11 MED ORDER — INSULIN DETEMIR 100 UNIT/ML ~~LOC~~ SOLN
40.0000 [IU] | Freq: Every day | SUBCUTANEOUS | Status: DC
  Administered 2022-07-12 – 2022-08-22 (×43): 40 [IU] via SUBCUTANEOUS
  Filled 2022-07-11 (×44): qty 0.4

## 2022-07-11 MED ORDER — VALPROIC ACID 250 MG PO CAPS
250.0000 mg | ORAL_CAPSULE | Freq: Two times a day (BID) | ORAL | Status: DC
  Administered 2022-07-12 – 2022-08-22 (×84): 250 mg via ORAL
  Filled 2022-07-11 (×87): qty 1

## 2022-07-11 MED ORDER — ALBUTEROL SULFATE (2.5 MG/3ML) 0.083% IN NEBU: 2.5000 mg | INHALATION_SOLUTION | Freq: Four times a day (QID) | RESPIRATORY_TRACT | Status: DC | PRN

## 2022-07-11 MED ORDER — MELATONIN 5 MG PO TABS
5.0000 mg | ORAL_TABLET | Freq: Every day | ORAL | Status: DC
  Administered 2022-07-12 – 2022-08-22 (×43): 5 mg via ORAL
  Filled 2022-07-11 (×43): qty 1
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
## 2022-07-11 NOTE — ED Notes (Signed)
IVC  CONSULT  DONE  PENDING  TOC

## 2022-07-11 NOTE — BH Assessment (Signed)
Writer spoke with the patient's aunt Langley Gauss [phone]), she's requesting the patient be admitted to psych. Writer explained the patient does not meet inpatient criteria. She requested the patient be placed out of the home. Writer shared he can not say if that's a possibility because that's another department but would forward the information to the ER. Writer reiterated the patient was psych cleared and does not meet inpatient criteria.

## 2022-07-11 NOTE — ED Notes (Signed)
Pt's nephew, Earnest Bailey [phone]), called checking on pt. Pt gave verbal consent for this RN to speak with him. He was updated that pt remains in ED pending TOC consult. He was made aware of designated phone use times and encouraged to call back during those times to speak with pt.

## 2022-07-11 NOTE — ED Notes (Signed)
Pt given dinner tray and beverage  
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
## 2022-07-11 NOTE — ED Notes (Addendum)
Pt dressed put into hospital scrubs with Brandi Myers and this EDT Belongings include Blue Sweatshirt Black pants Tan bra  Black socks  Sliver necklace Black necklace

## 2022-07-11 NOTE — ED Notes (Signed)
Pt ivc/pending consult

## 2022-07-11 NOTE — ED Notes (Signed)
pt recieved snack and drink 

## 2022-07-12 LAB — CBG MONITORING, ED
Glucose-Capillary: 112 mg/dL — ABNORMAL HIGH (ref 70–99)
Glucose-Capillary: 146 mg/dL — ABNORMAL HIGH (ref 70–99)
Glucose-Capillary: 130 mg/dL — ABNORMAL HIGH (ref 70–99)
Glucose-Capillary: 116 mg/dL — ABNORMAL HIGH (ref 70–99)
Glucose-Capillary: 129 mg/dL — ABNORMAL HIGH (ref 70–99)

## 2022-07-12 NOTE — ED Notes (Signed)
Pt moved to room for less stimuli so she can get some sleep. Pt assisted to bedside commode per request and as she was moving from bed to commode, pt had loose BM in hospital issued scrubs. Pt assisted to commode and while this nurse went to get supplies to clean pt, pt removed clothes and proceeded to get stool all over floor, bed linens and herself. This RN and [place], NT, cleaned room. bed, and patient and pt's linens and scrubs changed. Pt assisted back to bed and warm blankets and pillow provided. Lights dimmed and TV on per pt request. Pt resting comfortably at this time

## 2022-07-12 NOTE — ED Provider Notes (Signed)
Emergency Medicine Observation Re-evaluation Note  SANIJAH ESTELLE is a 37 y.o. female, seen on rounds today.  Pt initially presented to the ED for complaints of Psychiatric Evaluation Currently, the patient is resting, voices no medical complaints.  Physical Exam  BP 102/71 (BP Location: Right Arm)   Pulse 90   Temp 98.6 F (37 C) (Oral)   Resp 16   Ht 5\' 5"  (1.651 m)   Wt 84 kg   SpO2 95%   BMI 30.82 kg/m  Physical Exam General: Resting in no acute distress Cardiac: No cyanosis Lungs: Equal rise and fall Psych: Not agitated  ED Course / MDM  EKG:   I have reviewed the labs performed to date as well as medications administered while in observation.  Recent changes in the last 24 hours include no events overnight.  Plan  Current plan is for psychiatric disposition.    Paulette Blanch, MD 07/12/22 [phone]

## 2022-07-12 NOTE — ED Notes (Addendum)
Report received from Billy, RN

## 2022-07-12 NOTE — ED Notes (Signed)
Given breakfast tray. 

## 2022-07-12 NOTE — ED Notes (Signed)
CBG 129  Pt allowd to use the phone since other pts were using it dduring phone hours. Pt made aware of the 15 minute limit on phone usage. EDT dialed the number the pt verbally instructed this tech to call. Pt on phone at this time. Phone will be collected back at 22:05

## 2022-07-12 NOTE — ED Notes (Signed)
Patient given dinner tray. Trash disposed of properly.

## 2022-07-12 NOTE — ED Notes (Signed)
Patient is IVC dose not meet inpatient psych admit

## 2022-07-12 NOTE — ED Notes (Signed)
Hospital snack tray, foam cup of ice cream, and soda in a foam cup provided. EDT provided assistance opening foods within the snack tray per pt request. Pt encouraged to sit up to eat food.
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
## 2022-07-13 LAB — CBG MONITORING, ED
Glucose-Capillary: 135 mg/dL — ABNORMAL HIGH (ref 70–99)
Glucose-Capillary: 141 mg/dL — ABNORMAL HIGH (ref 70–99)
Glucose-Capillary: 119 mg/dL — ABNORMAL HIGH (ref 70–99)
Glucose-Capillary: 146 mg/dL — ABNORMAL HIGH (ref 70–99)

## 2022-07-13 NOTE — ED Notes (Signed)
pt recieved snack and drink 

## 2022-07-13 NOTE — ED Notes (Signed)
Given dinner

## 2022-07-13 NOTE — ED Notes (Signed)
Lunch tray and beverage delivered to pt.

## 2022-07-13 NOTE — ED Notes (Signed)
IVC/pending TOC consult

## 2022-07-14 LAB — CBG MONITORING, ED
Glucose-Capillary: 166 mg/dL — ABNORMAL HIGH (ref 70–99)
Glucose-Capillary: 195 mg/dL — ABNORMAL HIGH (ref 70–99)
Glucose-Capillary: 128 mg/dL — ABNORMAL HIGH (ref 70–99)

## 2022-07-14 NOTE — ED Notes (Addendum)
Pt's BSC cleaned and emptied out. Pt's BSC cleaned with clorox and clean basin placed in [place]. Clean chux pad placed under BSC. Floor also cloroxed and cleaned up. Pt instructed to help staff know when he uses the [facility]. Toilet paper provided to pt.

## 2022-07-14 NOTE — ED Notes (Signed)
ivc/pending toc placement.. 

## 2022-07-14 NOTE — ED Provider Notes (Signed)
Emergency Medicine Observation Re-evaluation Note  Brandi Myers is a 37 y.o. female, seen on rounds today.  Pt initially presented to the ED for complaints of Psychiatric Evaluation Currently, the patient is resting, voices no medical complaints.  Physical Exam  BP 103/74   Pulse 86   Temp 98.2 F (36.8 C) (Oral)   Resp 17   Ht 5\' 5"  (1.651 m)   Wt 84 kg   SpO2 95%   BMI 30.82 kg/m  Physical Exam General: Resting in no acute distress Cardiac: No cyanosis Lungs: Equal rise and fall Psych: Not agitated  ED Course / MDM  EKG:   I have reviewed the labs performed to date as well as medications administered while in observation.  Recent changes in the last 24 hours include no events overnight.  Plan  Current plan is for psychiatric disposition.    Paulette Blanch, MD 07/14/22 [phone]

## 2022-07-14 NOTE — ED Notes (Signed)
Pt given warm blanket and grape juice

## 2022-07-14 NOTE — ED Notes (Signed)
IVC  PENDING  TOC  PLACEMENT

## 2022-07-14 NOTE — ED Notes (Signed)
Pt given lunch tray.
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
## 2022-07-14 NOTE — ED Notes (Signed)
Pt provided with breakfast tray at this time.

## 2022-07-14 NOTE — ED Notes (Signed)
Pt given PM snack at this time.  

## 2022-07-14 NOTE — ED Notes (Signed)
Pt given dinner tray.

## 2022-07-15 LAB — CBG MONITORING, ED
Glucose-Capillary: 113 mg/dL — ABNORMAL HIGH (ref 70–99)
Glucose-Capillary: 190 mg/dL — ABNORMAL HIGH (ref 70–99)
Glucose-Capillary: 141 mg/dL — ABNORMAL HIGH (ref 70–99)
Glucose-Capillary: 197 mg/dL — ABNORMAL HIGH (ref 70–99)

## 2022-07-15 NOTE — ED Notes (Signed)
This NT gave pt pm snack at this time.

## 2022-07-15 NOTE — ED Notes (Signed)
Breakfast tray provided for pt.  

## 2022-07-15 NOTE — ED Notes (Signed)
Hospital meal provided, pt tolerated w/o complaints.  Waste discarded appropriately.  

## 2022-07-15 NOTE — ED Provider Notes (Signed)
-----------------------------------------   6:30 AM on 07/15/2022 -----------------------------------------   Blood pressure 121/81, pulse 84, temperature 98.4 F (36.9 C), temperature source Oral, resp. rate 16, height 5\' 5"  (1.651 m), weight 84 kg, SpO2 99 %.  The patient is calm and cooperative at this time.  There have been no acute events since the last update.  Awaiting disposition plan from case management/social work.    Mckynleigh Mussell, Delice Bison, DO 07/15/22 0630

## 2022-07-15 NOTE — ED Notes (Signed)
INVOLUNTARY continues to await TOC placement

## 2022-07-16 LAB — CBG MONITORING, ED
Glucose-Capillary: 120 mg/dL — ABNORMAL HIGH (ref 70–99)
Glucose-Capillary: 179 mg/dL — ABNORMAL HIGH (ref 70–99)
Glucose-Capillary: 141 mg/dL — ABNORMAL HIGH (ref 70–99)
Glucose-Capillary: 180 mg/dL — ABNORMAL HIGH (ref 70–99)

## 2022-07-16 NOTE — ED Provider Notes (Signed)
Emergency Medicine Observation Re-evaluation Note  Physical Exam   BP 98/80 (BP Location: Left Arm)   Pulse 98   Temp 98.8 F (37.1 C) (Oral)   Resp 17   Ht 5\' 5"  (1.651 m)   Wt 84 kg   SpO2 99%   BMI 30.82 kg/m   Patient appears in no acute distress.  ED Course / MDM   No reported events during my shift at the time of this note.   Pt is awaiting dispo from SW   Lucillie Garfinkel MD    Lucillie Garfinkel, MD 07/16/22 [phone]

## 2022-07-16 NOTE — ED Notes (Signed)
Pt given PM snack at this time.  

## 2022-07-16 NOTE — ED Notes (Signed)
Pt given lunch tray at this time

## 2022-07-16 NOTE — ED Notes (Signed)
IVC TOC placement 

## 2022-07-17 LAB — CBG MONITORING, ED
Glucose-Capillary: 109 mg/dL — ABNORMAL HIGH (ref 70–99)
Glucose-Capillary: 246 mg/dL — ABNORMAL HIGH (ref 70–99)
Glucose-Capillary: 119 mg/dL — ABNORMAL HIGH (ref 70–99)
Glucose-Capillary: 202 mg/dL — ABNORMAL HIGH (ref 70–99)

## 2022-07-17 MED ORDER — IBUPROFEN 600 MG PO TABS
600.0000 mg | ORAL_TABLET | Freq: Once | ORAL | Status: AC
  Administered 2022-07-17: 600 mg via ORAL
  Filled 2022-07-17: qty 1

## 2022-07-17 MED ORDER — ACETAMINOPHEN 500 MG PO TABS
1000.0000 mg | ORAL_TABLET | Freq: Three times a day (TID) | ORAL | Status: DC | PRN
  Administered 2022-07-17 – 2022-08-22 (×35): 1000 mg via ORAL
  Filled 2022-07-17 (×35): qty 2

## 2022-07-17 NOTE — ED Notes (Signed)
Received report from Hannah RN. 

## 2022-07-17 NOTE — ED Notes (Signed)
Ivc /pending TOC placement / IVC Papers to be re-newed 07/18/22

## 2022-07-17 NOTE — ED Provider Notes (Signed)
Emergency Medicine Observation Re-evaluation Note  INNOCENCE SOSNA is a 37 y.o. female, seen on rounds today.  Pt initially presented to the ED for complaints of Psychiatric Evaluation  Currently, the patient is calm, no acute complaints.  Physical Exam  Blood pressure 109/79, pulse 97, temperature 98.4 F (36.9 C), temperature source Oral, resp. rate 16, height 5\' 5"  (1.651 m), weight 84 kg, SpO2 97 %. Physical Exam General: NAD Lungs: CTAB Psych: not agitated  ED Course / MDM  EKG:    I have reviewed the labs performed to date as well as medications administered while in observation.  Recent changes in the last 24 hours include no acute events overnight.    Plan  Current plan is for SW placement . Patient is under full IVC at this time.   Carrie Mew, MD 07/17/22 [id number]

## 2022-07-17 NOTE — ED Notes (Signed)
Snack provided

## 2022-07-17 NOTE — ED Notes (Signed)
MD notified of patient glucose level since no insulin is scheduled at this time.

## 2022-07-17 NOTE — ED Notes (Signed)
Pt complaining of headache, back and neck pain from laying in bed, requesting pain medication. MD notified

## 2022-07-17 NOTE — ED Notes (Signed)
Patient meal tray was given at this time.

## 2022-07-17 NOTE — ED Notes (Signed)
Hospital meal provided, pt tolerated w/o complaints.  Waste discarded appropriately.  

## 2022-07-17 NOTE — ED Notes (Signed)
Lunch tray given. 

## 2022-07-18 LAB — CBG MONITORING, ED
Glucose-Capillary: 181 mg/dL — ABNORMAL HIGH (ref 70–99)
Glucose-Capillary: 160 mg/dL — ABNORMAL HIGH (ref 70–99)
Glucose-Capillary: 149 mg/dL — ABNORMAL HIGH (ref 70–99)
Glucose-Capillary: 224 mg/dL — ABNORMAL HIGH (ref 70–99)

## 2022-07-18 NOTE — ED Notes (Signed)
Pt VOL at this time legal papers will not be renewed/TOC Placement

## 2022-07-18 NOTE — Consult Note (Signed)
Client was psych cleared and IVC is out of date, will not be renewed as she is not a threat to herself or others.  She is a placement for TOC who are working diligently on this.  Nanine Means, PMHNP

## 2022-07-18 NOTE — TOC Progression Note (Addendum)
Transition of Care [facility]) - Progression Note    Patient Details  Name: Brandi Myers MRN: [id number] Date of Birth: 1985/12/30  Transition of Care [facility]) CM/SW Contact  Darleene Cleaver, [place] Phone Number: 07/18/2022, 2:01 PM  Clinical Narrative:     CSW attempted to contact DSS left a message on voice mail for them to call CSW back.  Patient does not have any placement options at this time.  TOC contacted APS for guidance on what the next steps are for patient and if she can return back home with family members or not.  TOC to continue to follow patient's progress.  Expected Discharge Plan:  (TBD) Barriers to Discharge:  (placement - family abandonment)  Expected Discharge Plan and Services                                               Social Determinants of Health (SDOH) Interventions SDOH Screenings   Tobacco Use: High Risk (07/11/2022)    Readmission Risk Interventions     No data to display

## 2022-07-18 NOTE — ED Notes (Signed)
Lunch given.

## 2022-07-18 NOTE — ED Provider Notes (Signed)
-----------------------------------------   8:47 AM on 07/18/2022 -----------------------------------------   Blood pressure 109/79, pulse 97, temperature 98.4 F (36.9 C), temperature source Oral, resp. rate 16, height 1.651 m (5\' 5" ), weight 84 kg, SpO2 97 %.  The patient is calm and cooperative at this time.  There have been no acute events since the last update.  Awaiting disposition plan from [facility] team.  Of note, I reviewed the patient's record, and I see that the psychiatry service signed off on this patient on 07/11/2022.  However, she remains under involuntary commitment.  I sent a [facility] message to Nanine Means and Dr. Toni Amend to request that they revoked the IVC if they feel that is appropriate and that she does not meet IVC nor inpatient psychiatric criteria.   Loleta Rose, MD 07/18/22 [phone]

## 2022-07-18 NOTE — ED Notes (Signed)
IVC/pending TOC placement, papers need to be renewed by 07/18/22 @ 11AM

## 2022-07-18 NOTE — ED Notes (Signed)
Pt given snacks and drinks.  

## 2022-07-18 NOTE — ED Notes (Signed)
Pt on the phone talking to brother and grandmother. Brother states he cannot pick up pt.

## 2022-07-19 LAB — CBG MONITORING, ED
Glucose-Capillary: 164 mg/dL — ABNORMAL HIGH (ref 70–99)
Glucose-Capillary: 239 mg/dL — ABNORMAL HIGH (ref 70–99)
Glucose-Capillary: 124 mg/dL — ABNORMAL HIGH (ref 70–99)
Glucose-Capillary: 143 mg/dL — ABNORMAL HIGH (ref 70–99)

## 2022-07-19 NOTE — ED Notes (Signed)
Pts CBG checked by this EDT. CBG 124 at this time

## 2022-07-19 NOTE — ED Notes (Signed)
vol/toc placement.. 

## 2022-07-19 NOTE — ED Notes (Signed)
Patient is vol pending TOC placement 

## 2022-07-19 NOTE — ED Notes (Signed)
Trash cleaned from pts room per pts request. Old cups and trays disposed of properly. Pt denied needing her bedside commode changed and cleaned. Pt asked if she could get "chocolate milk or ginger ale right now because my throats real dry". Pt made aware she may have water right now and special drinks would be allowed at dinner and snack time. Pt denied wanting water and stated "Okay".

## 2022-07-19 NOTE — ED Notes (Signed)
Pt bedside commode emptied at pt's request, cups and tray removed from room.

## 2022-07-19 NOTE — ED Notes (Signed)
Hospital dinner tray set up and provided to pt by this EDT. Pt encouraged to sit up and eat. Pt also provided with ginger ale in a foam cup. Pt denied needing anything else at this time.

## 2022-07-19 NOTE — ED Notes (Signed)
Got breakfast 

## 2022-07-19 NOTE — ED Provider Notes (Signed)
-----------------------------------------   5:39 AM on 07/19/2022 -----------------------------------------   Blood pressure 109/79, pulse 97, temperature 98.4 F (36.9 C), temperature source Oral, resp. rate 16, height 5\' 5"  (1.651 m), weight 84 kg, SpO2 97 %.  The patient is calm and cooperative at this time.  There have been no acute events since the last update.  Awaiting disposition plan from Social Work team.   Irean Hong, MD 07/19/22 [phone]

## 2022-07-19 NOTE — ED Notes (Signed)
Ihor Gully can brought to pts room to dispose of dinner tray and trash. EDT insisted on cleaning out pts bedside commode since it was starting to smell. Pt agreed to get bedside commode cleaned. Pts bedside commode dumped, cleaned with purple wipes, and returned to pts bedside. Pt provided a new warm blanket. Pt denied needing anything else at this time.

## 2022-07-19 NOTE — ED Notes (Signed)
Pt given nighttime snack. 

## 2022-07-20 LAB — CBG MONITORING, ED
Glucose-Capillary: 208 mg/dL — ABNORMAL HIGH (ref 70–99)
Glucose-Capillary: 167 mg/dL — ABNORMAL HIGH (ref 70–99)
Glucose-Capillary: 173 mg/dL — ABNORMAL HIGH (ref 70–99)
Glucose-Capillary: 164 mg/dL — ABNORMAL HIGH (ref 70–99)

## 2022-07-20 NOTE — ED Notes (Signed)
Dinner tray provided

## 2022-07-20 NOTE — ED Notes (Signed)
Patient given breakfast tray and orange juice 

## 2022-07-20 NOTE — ED Provider Notes (Signed)
    07/19/2022    8:22 PM 07/19/2022    6:42 PM 07/17/2022    7:56 PM  Vitals with BMI  Systolic 114 100 751  Diastolic 81 67 79  Pulse 102 93 97    Patient is resting in bed.  Asking when breakfast is.  No distress.  He is awaiting social work placement.   Georga Hacking, MD 07/20/22 [phone]

## 2022-07-20 NOTE — ED Notes (Signed)
Patient given two warm blankets   

## 2022-07-20 NOTE — ED Notes (Signed)
Patient given peanut butter and crackers as snack

## 2022-07-20 NOTE — ED Notes (Signed)
VOL TOC placement 

## 2022-07-20 NOTE — ED Notes (Signed)
Patient eating lunch tray at this time °

## 2022-07-21 LAB — CBG MONITORING, ED
Glucose-Capillary: 132 mg/dL — ABNORMAL HIGH (ref 70–99)
Glucose-Capillary: 284 mg/dL — ABNORMAL HIGH (ref 70–99)
Glucose-Capillary: 188 mg/dL — ABNORMAL HIGH (ref 70–99)
Glucose-Capillary: 241 mg/dL — ABNORMAL HIGH (ref 70–99)

## 2022-07-21 NOTE — ED Notes (Signed)
VOL  TOC  PLACEMENT 

## 2022-07-21 NOTE — ED Notes (Signed)
Patient is alert and orient, also currently using the bedside commode.

## 2022-07-21 NOTE — ED Notes (Signed)
Patient requested to use the phone at 5:00pm. Patient also, received her dinner tray.

## 2022-07-21 NOTE — ED Notes (Signed)
VOL/TOC Placement 

## 2022-07-21 NOTE — ED Notes (Signed)
Pt provided sandwich tray 

## 2022-07-21 NOTE — ED Provider Notes (Signed)
-----------------------------------------   5:08 AM on 07/21/2022 -----------------------------------------   Blood pressure 106/81, pulse 95, temperature 98.4 F (36.9 C), temperature source Oral, resp. rate 18, height 5\' 5"  (1.651 m), weight 84 kg, SpO2 99 %.  The patient is calm and cooperative at this time.  There have been no acute events since the last update.  Awaiting disposition plan from Social Work team.   Irean Hong, MD 07/21/22 [phone]

## 2022-07-21 NOTE — ED Notes (Signed)
Snack of peanut butter and crackers provided per patient's request.

## 2022-07-22 LAB — CBC WITH DIFFERENTIAL/PLATELET
Abs Immature Granulocytes: 0.02 10*3/uL (ref 0.00–0.07)
Basophils Absolute: 0.1 10*3/uL (ref 0.0–0.1)
Basophils Relative: 1 %
Eosinophils Absolute: 0.2 10*3/uL (ref 0.0–0.5)
Eosinophils Relative: 3 %
HCT: 34.9 % — ABNORMAL LOW (ref 36.0–46.0)
Hemoglobin: 9.6 g/dL — ABNORMAL LOW (ref 12.0–15.0)
Immature Granulocytes: 0 %
Lymphocytes Relative: 35 %
Lymphs Abs: 3.3 10*3/uL (ref 0.7–4.0)
MCH: 19.4 pg — ABNORMAL LOW (ref 26.0–34.0)
MCHC: 27.5 g/dL — ABNORMAL LOW (ref 30.0–36.0)
MCV: 70.6 fL — ABNORMAL LOW (ref 80.0–100.0)
Monocytes Absolute: 0.7 10*3/uL (ref 0.1–1.0)
Monocytes Relative: 7 %
Neutro Abs: 5.2 10*3/uL (ref 1.7–7.7)
Neutrophils Relative %: 54 %
Platelets: 267 10*3/uL (ref 150–400)
RBC: 4.94 MIL/uL (ref 3.87–5.11)
RDW: 18.6 % — ABNORMAL HIGH (ref 11.5–15.5)
WBC: 9.6 10*3/uL (ref 4.0–10.5)
nRBC: 0 % (ref 0.0–0.2)

## 2022-07-22 LAB — BASIC METABOLIC PANEL
Anion gap: 8 (ref 5–15)
BUN: 13 mg/dL (ref 6–20)
CO2: 27 mmol/L (ref 22–32)
Calcium: 8.5 mg/dL — ABNORMAL LOW (ref 8.9–10.3)
Chloride: 99 mmol/L (ref 98–111)
Creatinine, Ser: 0.68 mg/dL (ref 0.44–1.00)
GFR, Estimated: >60 mL/min (ref >60)
Glucose, Bld: 239 mg/dL — ABNORMAL HIGH (ref 70–99)
Potassium: 3.7 mmol/L (ref 3.5–5.1)
Sodium: 134 mmol/L — ABNORMAL LOW (ref 135–145)

## 2022-07-22 LAB — CBG MONITORING, ED
Glucose-Capillary: 153 mg/dL — ABNORMAL HIGH (ref 70–99)
Glucose-Capillary: 168 mg/dL — ABNORMAL HIGH (ref 70–99)
Glucose-Capillary: 208 mg/dL — ABNORMAL HIGH (ref 70–99)
Glucose-Capillary: 233 mg/dL — ABNORMAL HIGH (ref 70–99)

## 2022-07-22 NOTE — ED Notes (Signed)
Peanutbutter and crackers provided per pt request.

## 2022-07-22 NOTE — Inpatient Diabetes Management (Signed)
Inpatient Diabetes Program Recommendations  AACE/ADA: New Consensus Statement on Inpatient Glycemic Control   Target Ranges:  Prepandial:   less than 140 mg/dL      Peak postprandial:   less than 180 mg/dL (1-2 hours)      Critically ill patients:  140 - 180 mg/dL    Latest Reference Range & Units 07/21/22 09:01 07/21/22 11:50 07/21/22 16:33 07/21/22 21:36  Glucose-Capillary 70 - 99 mg/dL 415 (H) 830 (H) 940 (H) 241 (H)   Review of Glycemic Control  Diabetes history: DM2 Outpatient Diabetes medications: Levemir 40 units daily, Novolog 0-15 units Q4H, Novolog 6 units Q4H, Trulicity 0.75 mg Qweek Current orders for Inpatient glycemic control: Levemir 40 units daily, Novolog 0-15 units TID with meals  Inpatient Diabetes Program Recommendations:    Insulin: Please consider ordering Novolog 0-5 units QHS for bedtime correction and Novolog 3 units TID with meals for meal coverage if patient eats at least 50% of meals.  Thanks, Orlando Penner, RN, MSN, CDCES Diabetes Coordinator Inpatient Diabetes Program [phone] (Team Pager from 8am to 5pm)

## 2022-07-22 NOTE — TOC Progression Note (Signed)
Transition of Care [facility]) - Progression Note    Patient Details  Name: Brandi Myers MRN: [id number] Date of Birth: Apr 17, 1985  Transition of Care [facility]) CM/SW Contact  Darleene Cleaver, [place] Phone Number: 07/21/2022 4:15pm  Clinical Narrative:     Patient has an open APS case.  Per [facility] APS, they were going to speak to family to see if they are agreeable to letting patient return.  DSS to let CSW know if they made any progress with patient's family.  TOC continuing to follow patient's progress throughout discharge planning.   Expected Discharge Plan:  (TBD) Barriers to Discharge:  (placement - family abandonment)  Expected Discharge Plan and Services                                               Social Determinants of Health (SDOH) Interventions SDOH Screenings   Tobacco Use: High Risk (07/11/2022)    Readmission Risk Interventions     No data to display

## 2022-07-22 NOTE — ED Notes (Signed)
Pt alert watching tv.    °

## 2022-07-22 NOTE — ED Notes (Signed)
Pt given diet sprite by EDT

## 2022-07-22 NOTE — ED Notes (Signed)
Vol  pending  TOC  placement 

## 2022-07-22 NOTE — ED Notes (Signed)
Fsbs 233

## 2022-07-23 LAB — CBG MONITORING, ED
Glucose-Capillary: 188 mg/dL — ABNORMAL HIGH (ref 70–99)
Glucose-Capillary: 211 mg/dL — ABNORMAL HIGH (ref 70–99)
Glucose-Capillary: 237 mg/dL — ABNORMAL HIGH (ref 70–99)
Glucose-Capillary: 190 mg/dL — ABNORMAL HIGH (ref 70–99)
Glucose-Capillary: 176 mg/dL — ABNORMAL HIGH (ref 70–99)

## 2022-07-23 MED ORDER — INSULIN ASPART 100 UNIT/ML IJ SOLN
3.0000 [IU] | Freq: Three times a day (TID) | INTRAMUSCULAR | Status: DC
  Administered 2022-07-23 – 2022-08-22 (×86): 3 [IU] via SUBCUTANEOUS
  Filled 2022-07-23 (×75): qty 1

## 2022-07-23 MED ORDER — INSULIN ASPART 100 UNIT/ML IJ SOLN
0.0000 [IU] | Freq: Every day | INTRAMUSCULAR | Status: DC
  Administered 2022-07-30: 2 [IU] via SUBCUTANEOUS
  Administered 2022-08-08: 3 [IU] via SUBCUTANEOUS
  Administered 2022-08-09 – 2022-08-17 (×6): 2 [IU] via SUBCUTANEOUS
  Administered 2022-08-21: 4 [IU] via SUBCUTANEOUS
  Administered 2022-08-22: 2 [IU] via SUBCUTANEOUS
  Filled 2022-07-23 (×12): qty 1

## 2022-07-23 MED ORDER — INSULIN ASPART 100 UNIT/ML IJ SOLN
0.0000 [IU] | Freq: Three times a day (TID) | INTRAMUSCULAR | Status: DC
  Administered 2022-07-23: 5 [IU] via SUBCUTANEOUS
  Administered 2022-07-23: 3 [IU] via SUBCUTANEOUS
  Administered 2022-07-24: 8 [IU] via SUBCUTANEOUS
  Administered 2022-07-24: 3 [IU] via SUBCUTANEOUS
  Administered 2022-07-25: 2 [IU] via SUBCUTANEOUS
  Administered 2022-07-25 – 2022-07-27 (×6): 3 [IU] via SUBCUTANEOUS
  Administered 2022-07-27: 2 [IU] via SUBCUTANEOUS
  Administered 2022-07-28: 5 [IU] via SUBCUTANEOUS
  Administered 2022-07-28: 2 [IU] via SUBCUTANEOUS
  Administered 2022-07-29: 5 [IU] via SUBCUTANEOUS
  Administered 2022-07-29: 3 [IU] via SUBCUTANEOUS
  Administered 2022-07-29: 5 [IU] via SUBCUTANEOUS
  Administered 2022-07-30: 8 [IU] via SUBCUTANEOUS
  Administered 2022-07-30 – 2022-07-31 (×3): 3 [IU] via SUBCUTANEOUS
  Administered 2022-07-31 – 2022-08-01 (×3): 2 [IU] via SUBCUTANEOUS
  Administered 2022-08-01: 3 [IU] via SUBCUTANEOUS
  Administered 2022-08-02: 5 [IU] via SUBCUTANEOUS
  Administered 2022-08-02 (×2): 3 [IU] via SUBCUTANEOUS
  Administered 2022-08-03: 8 [IU] via SUBCUTANEOUS
  Administered 2022-08-03 – 2022-08-04 (×3): 3 [IU] via SUBCUTANEOUS
  Administered 2022-08-04: 5 [IU] via SUBCUTANEOUS
  Administered 2022-08-05 – 2022-08-06 (×4): 3 [IU] via SUBCUTANEOUS
  Administered 2022-08-06: 5 [IU] via SUBCUTANEOUS
  Administered 2022-08-06 – 2022-08-07 (×2): 2 [IU] via SUBCUTANEOUS
  Administered 2022-08-07: 5 [IU] via SUBCUTANEOUS
  Administered 2022-08-07 – 2022-08-08 (×2): 3 [IU] via SUBCUTANEOUS
  Administered 2022-08-08: 5 [IU] via SUBCUTANEOUS
  Administered 2022-08-08: 2 [IU] via SUBCUTANEOUS
  Administered 2022-08-09: 3 [IU] via SUBCUTANEOUS
  Administered 2022-08-09: 2 [IU] via SUBCUTANEOUS
  Administered 2022-08-09: 3 [IU] via SUBCUTANEOUS
  Administered 2022-08-10: 2 [IU] via SUBCUTANEOUS
  Administered 2022-08-10 (×2): 3 [IU] via SUBCUTANEOUS
  Administered 2022-08-11: 2 [IU] via SUBCUTANEOUS
  Administered 2022-08-11 (×2): 3 [IU] via SUBCUTANEOUS
  Administered 2022-08-12 – 2022-08-13 (×2): 5 [IU] via SUBCUTANEOUS
  Administered 2022-08-14 (×2): 3 [IU] via SUBCUTANEOUS
  Administered 2022-08-14: 8 [IU] via SUBCUTANEOUS
  Administered 2022-08-15 (×3): 2 [IU] via SUBCUTANEOUS
  Administered 2022-08-16: 5 [IU] via SUBCUTANEOUS
  Administered 2022-08-16: 3 [IU] via SUBCUTANEOUS
  Administered 2022-08-16: 2 [IU] via SUBCUTANEOUS
  Administered 2022-08-17 – 2022-08-18 (×5): 3 [IU] via SUBCUTANEOUS
  Administered 2022-08-18: 2 [IU] via SUBCUTANEOUS
  Administered 2022-08-19 (×2): 3 [IU] via SUBCUTANEOUS
  Administered 2022-08-19 – 2022-08-20 (×3): 2 [IU] via SUBCUTANEOUS
  Administered 2022-08-20 – 2022-08-21 (×2): 3 [IU] via SUBCUTANEOUS
  Administered 2022-08-21: 11 [IU] via SUBCUTANEOUS
  Administered 2022-08-21: 5 [IU] via SUBCUTANEOUS
  Administered 2022-08-22 (×2): 3 [IU] via SUBCUTANEOUS
  Administered 2022-08-22: 5 [IU] via SUBCUTANEOUS
  Filled 2022-07-23 (×81): qty 1

## 2022-07-23 NOTE — ED Notes (Signed)
Lunch delivered. Patient given requested items. Patient verbally express dislike to the room that she was moved to and requested to be moved back when the room is available. RN verbalized understanding to concern. Patient is sitting in bed eating lunch with no difficulty. Patient denies other needs at this time.

## 2022-07-23 NOTE — ED Notes (Signed)
VOL TOC placement 

## 2022-07-23 NOTE — ED Notes (Signed)
Patient given something to drink and updated on breakfast. Patient denies other needs at this time. Patient resting in bed watching TV at this time.

## 2022-07-23 NOTE — ED Notes (Signed)
Pt given crackers and peanut butter and drink at this time.

## 2022-07-23 NOTE — ED Notes (Signed)
Pt repositioned in bed and covered up with 5 blankets per pts request. Pts bedside commode cleaned by this EDT and returned to room. Pt allowed the tv remote to change the channel on her tv. Pt asked this EDT if she could have some diet soda. EDT informed pt that soda will be allowed at snack time, which will be at 20:00. EDT  let pt know the time right now so pt could get an idea of how long it would be until snack time. Pt denied needing anything else at this time.

## 2022-07-23 NOTE — ED Notes (Signed)
Breakfast delivered. Patient sitting up in bed eating with no difficulties.

## 2022-07-23 NOTE — ED Notes (Signed)
Pt resting in bed, denies needs at this time.

## 2022-07-23 NOTE — TOC Progression Note (Signed)
Transition of Care [facility]) - Progression Note    Patient Details  Name: ISAMARI CASADAY MRN: [id number] Date of Birth: [birth year]  Transition of Care [facility]) CM/SW Contact  Margarito Liner, LCSW Phone Number: 07/23/2022, 4:27 PM  Clinical Narrative: Sherron Monday to Diona Fanti with [facility] DSS. Patient is supposed to be approved for disability by the end of the month and get a check for back pay.  Expected Discharge Plan:  (TBD) Barriers to Discharge:  (placement - family abandonment)  Expected Discharge Plan and Services                                               Social Determinants of Health (SDOH) Interventions SDOH Screenings   Tobacco Use: High Risk (07/11/2022)    Readmission Risk Interventions     No data to display

## 2022-07-23 NOTE — ED Notes (Signed)
Pt resting in bed, drink given to pt

## 2022-07-23 NOTE — ED Notes (Signed)
Patient rounded on by RN. Patient is sleeping in bed with regular, unlabored breathing.

## 2022-07-23 NOTE — ED Notes (Signed)
vol/toc placement.. 

## 2022-07-23 NOTE — ED Notes (Signed)
Patient's room picked up and toilet cleaned. Patient resting in bed watching TV. Patient denies needs at this time.

## 2022-07-23 NOTE — ED Notes (Signed)
Pt sleeping. 

## 2022-07-24 LAB — CBG MONITORING, ED
Glucose-Capillary: 155 mg/dL — ABNORMAL HIGH (ref 70–99)
Glucose-Capillary: 265 mg/dL — ABNORMAL HIGH (ref 70–99)
Glucose-Capillary: 97 mg/dL (ref 70–99)
Glucose-Capillary: 151 mg/dL — ABNORMAL HIGH (ref 70–99)

## 2022-07-24 NOTE — ED Notes (Addendum)
Cleaned pt's bedside table off as she is finished with dinner tray. Pt c/c.

## 2022-07-24 NOTE — ED Notes (Addendum)
Pt received dinner tray, diet soda, and unsweet tea.

## 2022-07-24 NOTE — ED Notes (Signed)
Pt given fresh scrub top and 2 fresh warm blankets as requested. Pt denies any other needs currently.

## 2022-07-24 NOTE — ED Notes (Signed)
Pt given graham crackers, peanut butter, diet sprite for nighttime snack

## 2022-07-24 NOTE — ED Notes (Addendum)
Pt received breakfast tray, milk that came with it and water. Lights adjusted for pt. Pt adjusted HOB herself. Pt c/c and denies any needs currently. Pt confirms will eat more than 50% of meal; states will eat it all.

## 2022-07-24 NOTE — ED Notes (Addendum)
Pt notified this RN shutting her room door per protocol due to fire alarm going off. Pt c/c. Pt confirms urination and BM's have been WDL lately.

## 2022-07-24 NOTE — ED Notes (Signed)
Pt asleep in bed.

## 2022-07-24 NOTE — ED Notes (Signed)
Pt given diet soda as requested. Pt denies any needs currently. Pt notified breakfast trays should arrive by 9am.

## 2022-07-24 NOTE — ED Notes (Signed)
Pt's bedside commode emptied of very large amount of urine. Pt states this is from today and last night. Pt given hospital phone briefly.

## 2022-07-24 NOTE — ED Notes (Signed)
Called dietary as pt has yet to receive lunch tray. Dietary states tray should arrive within next 30 minutes.

## 2022-07-24 NOTE — ED Notes (Signed)
Pt used call bell to request crackers and peanut butter from NT; explained situation to NT. No more snacks given to pt. Pt used call bell again with same request; reiterated to pt this RN is trying to keep her from going into DKA and needing to be placed on an insulin gtt. Pt displeased.

## 2022-07-24 NOTE — ED Notes (Signed)
Pt given fresh underwear and female pads. Pt up using bedside commode. Pt denies any other needs currently.

## 2022-07-24 NOTE — ED Notes (Signed)
VOL TOC placement 

## 2022-07-25 LAB — CBG MONITORING, ED
Glucose-Capillary: 156 mg/dL — ABNORMAL HIGH (ref 70–99)
Glucose-Capillary: 200 mg/dL — ABNORMAL HIGH (ref 70–99)
Glucose-Capillary: 129 mg/dL — ABNORMAL HIGH (ref 70–99)
Glucose-Capillary: 180 mg/dL — ABNORMAL HIGH (ref 70–99)

## 2022-07-25 NOTE — ED Provider Notes (Signed)
-----------------------------------------   5:17 AM on 07/25/2022 -----------------------------------------   Blood pressure 118/72, pulse 83, temperature 98.8 F (37.1 C), temperature source Oral, resp. rate 16, height 5\' 5"  (1.651 m), weight 84 kg, SpO2 97 %.  The patient is calm and cooperative at this time.  There have been no acute events since the last update.  Awaiting disposition plan from case management/social work.    Auriah Hollings, Layla Maw, DO 07/25/22 [phone]

## 2022-07-25 NOTE — ED Notes (Signed)
Pt was given snack. 

## 2022-07-25 NOTE — ED Notes (Signed)
VOL  TOC  PLACEMENT 

## 2022-07-25 NOTE — ED Notes (Signed)
Pt given diet sprite 

## 2022-07-25 NOTE — ED Notes (Signed)
TOC

## 2022-07-26 LAB — HEPATIC FUNCTION PANEL
ALT: 15 U/L (ref 0–44)
AST: 16 U/L (ref 15–41)
Albumin: 3.3 g/dL — ABNORMAL LOW (ref 3.5–5.0)
Alkaline Phosphatase: 84 U/L (ref 38–126)
Bilirubin, Direct: <0.1 mg/dL (ref 0.0–0.2)
Total Bilirubin: 0.3 mg/dL (ref 0.3–1.2)
Total Protein: 7 g/dL (ref 6.5–8.1)

## 2022-07-26 LAB — CBG MONITORING, ED
Glucose-Capillary: 152 mg/dL — ABNORMAL HIGH (ref 70–99)
Glucose-Capillary: 192 mg/dL — ABNORMAL HIGH (ref 70–99)
Glucose-Capillary: 189 mg/dL — ABNORMAL HIGH (ref 70–99)
Glucose-Capillary: 144 mg/dL — ABNORMAL HIGH (ref 70–99)

## 2022-07-26 NOTE — ED Provider Notes (Signed)
-----------------------------------------   9:07 AM on 07/26/2022 -----------------------------------------   Blood pressure 113/87, pulse 98, temperature 98.3 F (36.8 C), temperature source Oral, resp. rate 18, height 1.651 m (5\' 5" ), weight 84 kg, SpO2 97 %.  The patient is calm and cooperative at this time.  There have been no acute events since the last update.  Awaiting disposition plan from [facility] team.   Loleta Rose, MD 07/26/22 [phone]

## 2022-07-26 NOTE — ED Notes (Signed)
Patient is vol pending TOC placement 

## 2022-07-26 NOTE — ED Notes (Signed)
Pt given [place] sandwich tray - eating at this time. Pt reports dinner meal tray had not come.

## 2022-07-27 LAB — CBG MONITORING, ED
Glucose-Capillary: 139 mg/dL — ABNORMAL HIGH (ref 70–99)
Glucose-Capillary: 171 mg/dL — ABNORMAL HIGH (ref 70–99)
Glucose-Capillary: 99 mg/dL (ref 70–99)
Glucose-Capillary: 124 mg/dL — ABNORMAL HIGH (ref 70–99)

## 2022-07-27 NOTE — ED Provider Notes (Signed)
-----------------------------------------   7:26 AM on 07/27/2022 -----------------------------------------   Blood pressure 101/70, pulse 90, temperature 98.4 F (36.9 C), temperature source Oral, resp. rate 19, height 5\' 5"  (1.651 m), weight 84 kg, SpO2 98 %.  The patient is calm and cooperative at this time.  There have been no acute events since the last update.  Awaiting disposition plan from case management/social work.    Sophia Sperry, Layla Maw, DO 07/27/22 [phone]

## 2022-07-27 NOTE — ED Provider Notes (Signed)
Emergency Medicine Observation Re-evaluation Note  Brandi Myers is a 37 y.o. female, currently boarding in the emergency department.  No acute events since last update.  Physical Exam  BP (!) 122/58   Pulse 85   Temp 98.4 F (36.9 C)   Resp 18   Ht 5\' 5"  (1.651 m)   Wt 84 kg   SpO2 98%   BMI 30.82 kg/m    ED Course / MDM   No recent lab work for review besides CBGs which have been largely well-controlled.  Plan  Current plan is for placement to improve living facility once available.  Social work is currently working with this patient to achieve this.    Minna Antis, MD 07/27/22 [phone]

## 2022-07-27 NOTE — ED Notes (Signed)
1800 pt given crackers and peanutbutter

## 2022-07-27 NOTE — ED Notes (Signed)
Pt provided snack and drink per request. No additional needs or c/o voiced at this time

## 2022-07-27 NOTE — ED Notes (Signed)
Pt resting in bed with eyes open, no needs at this time.

## 2022-07-27 NOTE — ED Notes (Signed)
Pt resting in bed, NAD noted, BS checked at this time, see results in chart.

## 2022-07-27 NOTE — ED Notes (Signed)
Pt eating dinner at this time

## 2022-07-28 LAB — CBG MONITORING, ED
Glucose-Capillary: 140 mg/dL — ABNORMAL HIGH (ref 70–99)
Glucose-Capillary: 213 mg/dL — ABNORMAL HIGH (ref 70–99)
Glucose-Capillary: 120 mg/dL — ABNORMAL HIGH (ref 70–99)
Glucose-Capillary: 140 mg/dL — ABNORMAL HIGH (ref 70–99)

## 2022-07-28 NOTE — ED Notes (Signed)
Breakfast tray being brought into room now.

## 2022-07-28 NOTE — ED Notes (Signed)
Pt given graham crackers and peanut butter.

## 2022-07-28 NOTE — ED Notes (Signed)
Room of pt was dirty. Old towels on floor, blood on floor, BSC full, and toilet clogged. Maintenance is fixing toilet now. Wiped down dirty surfaces with bleach wipes. EVS notified to come mop floor. When pt gets up next time will change bed sheet. Pt is currently on her period.

## 2022-07-28 NOTE — ED Notes (Signed)
Pt resting in bed at this time. 

## 2022-07-28 NOTE — ED Notes (Signed)
Pt given wipes

## 2022-07-28 NOTE — ED Notes (Signed)
Pt is awake and alert resting in bed at this time

## 2022-07-28 NOTE — ED Notes (Signed)
Called dietary, breakfast is on the way.

## 2022-07-28 NOTE — ED Notes (Signed)
VOL TOC placement 

## 2022-07-28 NOTE — ED Notes (Signed)
Pt given sandwich tray at this time. Reeducated pt on reasoning for limiting snacks with diabetes and concern for hyperglycemia.

## 2022-07-28 NOTE — TOC Progression Note (Signed)
Transition of Care [facility]) - Progression Note    Patient Details  Name: Brandi Myers MRN: [id number] Date of Birth: [birth year]  Transition of Care [facility]) CM/SW Contact  Darleene Cleaver, [place] Phone Number: 07/28/2022, 7:03 PM  Clinical Narrative:     APS open case on patient.  Diona Fanti APS worker continuing to work with patient and family to come up with a solution for discharge.  Expected Discharge Plan:  (TBD) Barriers to Discharge:  (placement - family abandonment)  Expected Discharge Plan and Services                                               Social Determinants of Health (SDOH) Interventions SDOH Screenings   Tobacco Use: High Risk (07/11/2022)    Readmission Risk Interventions     No data to display

## 2022-07-28 NOTE — ED Notes (Signed)
Detemir not here from pharmacy but will give once available.

## 2022-07-28 NOTE — ED Notes (Signed)
Pt requesting more graham crackers and peanut butter. Deferred to a later time.

## 2022-07-28 NOTE — ED Notes (Signed)
Pt given paper and crayons

## 2022-07-28 NOTE — ED Notes (Signed)
Introduced self to pt. Pt is awake. No needs expressed at this time.

## 2022-07-28 NOTE — ED Notes (Signed)
VOL/TOC Placement 

## 2022-07-28 NOTE — ED Notes (Signed)
Pt given diet sprite 

## 2022-07-28 NOTE — ED Notes (Signed)
Will give aspart once breakfast arrives.

## 2022-07-29 LAB — CBG MONITORING, ED
Glucose-Capillary: 222 mg/dL — ABNORMAL HIGH (ref 70–99)
Glucose-Capillary: 191 mg/dL — ABNORMAL HIGH (ref 70–99)
Glucose-Capillary: 226 mg/dL — ABNORMAL HIGH (ref 70–99)
Glucose-Capillary: 176 mg/dL — ABNORMAL HIGH (ref 70–99)

## 2022-07-29 NOTE — ED Notes (Signed)
Fsbs   226 ?

## 2022-07-29 NOTE — ED Notes (Signed)
Patient resting in bed with eyes closed. Appears sleeping. Resp even, unlabored on RA. No distress noted at this time.

## 2022-07-29 NOTE — ED Notes (Signed)
Patient given sandwich tray and diet sprite for snack because she reports she didn't eat much of her dinner tray.

## 2022-07-29 NOTE — ED Notes (Signed)
Pt awake and on bedside commode. Breakfast tray at bedside. Pt assisted with food.

## 2022-07-29 NOTE — [place] FL2 (Signed)
[place] MEDICAID FL2 LEVEL OF CARE FORM     IDENTIFICATION  Patient Name: Brandi Myers Birthdate: May 03, 1985 Sex: female Admission Date (Current Location): 07/11/2022  [place] and [place] Number:  [address] [id number] Facility and Address:  [facility], [address], [place], [place] [address]      Provider Number: [id number]  Attending Physician Name and Address:  No att. 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, without long-term current use of insulin 07/28/2020   Benign essential hypertension 07/28/2020   Thrombocytosis    Leukocytosis    Arterial thrombosis    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   Middle cerebral artery embolism, 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   Cellulitis 02/26/2016    Orientation RESPIRATION BLADDER Height & Weight     Self, Time, Situation, Place  Normal Continent Weight: 185 lb 3 oz (84 kg) Height:  5\' 5"  (165.1 cm)  BEHAVIORAL SYMPTOMS/MOOD NEUROLOGICAL BOWEL NUTRITION STATUS      Continent Diet  AMBULATORY STATUS COMMUNICATION OF NEEDS Skin   Limited Assist Verbally Normal                       Personal Care Assistance Level of Assistance  Bathing, Feeding, Dressing Bathing Assistance: Limited assistance Feeding assistance: Independent Dressing Assistance: Limited assistance     Functional  Limitations Info  Sight, Hearing, Speech Sight Info: Adequate Hearing Info: Adequate Speech Info: Adequate    SPECIAL CARE FACTORS FREQUENCY                       Contractures Contractures Info: Not present    Additional Factors Info  Code Status, Allergies, Insulin Sliding Scale Code Status Info: Full Code Allergies Info: Norco (Hydrocodone-acetaminophen)   Insulin Sliding Scale Info: insulin aspart (novoLOG) injection 0-15 Units 3x a day with meals.       Current Medications (07/29/2022):  This is the current hospital active medication list Current Facility-Administered Medications  Medication Dose Route Frequency Provider Last Rate Last Admin   acetaminophen (TYLENOL) tablet 1,000 mg  1,000 mg Oral Q8H PRN Sharman Cheek, MD   1,000 mg at 07/29/22 0949   albuterol (PROVENTIL) (2.5 MG/3ML) 0.083% nebulizer solution 2.5 mg  2.5 mg Inhalation Q6H PRN Shaune Pollack, MD       aspirin EC tablet 81 mg  81 mg Oral Daily Shaune Pollack, MD   81 mg at 07/29/22 0949   atorvastatin (LIPITOR) tablet 40 mg  40 mg Oral Daily Shaune Pollack, MD   40 mg at 07/29/22 0949   gabapentin (NEURONTIN) capsule 300 mg  300 mg Oral BID Shaune Pollack, MD   300 mg at 07/29/22 0948   insulin aspart (novoLOG) injection 0-15 Units  0-15 Units Subcutaneous TID WC Jaynie Bream, RPH   5 Units at 07/29/22 1635  insulin aspart (novoLOG) injection 0-5 Units  0-5 Units Subcutaneous QHS Jaynie Bream, RPH       insulin aspart (novoLOG) injection 3 Units  3 Units Subcutaneous TID WC Jaynie Bream, RPH   3 Units at 07/29/22 1636   insulin detemir (LEVEMIR) injection 40 Units  40 Units Subcutaneous Daily Shaune Pollack, MD   40 Units at 07/29/22 8119   melatonin tablet 5 mg  5 mg Oral QHS Shaune Pollack, MD   5 mg at 07/28/22 2112   risperiDONE (RISPERDAL) tablet 0.5 mg  0.5 mg Oral BID PRN Charm Rings, NP       sertraline (ZOLOFT) tablet 25 mg  25 mg Oral Daily Shaune Pollack, MD    25 mg at 07/29/22 1478   valproic acid (DEPAKENE) 250 MG capsule 250 mg  250 mg Oral BID Shaune Pollack, MD   250 mg at 07/29/22 1010   Current Outpatient Medications  Medication Sig Dispense Refill   acetaminophen (TYLENOL) 325 MG tablet Take 2 tablets (650 mg total) by mouth every 4 (four) hours as needed for mild pain (or temp > 37.5 C (99.5 F)).     albuterol (VENTOLIN HFA) 108 (90 Base) MCG/ACT inhaler Inhale 1-2 puffs into the lungs every 6 (six) hours as needed for wheezing or shortness of breath.     aspirin EC 81 MG tablet Take 81 mg by mouth daily. Swallow whole.     atorvastatin (LIPITOR) 40 MG tablet Place 1 tablet (40 mg total) into feeding tube daily at 6 PM. (Patient taking differently: Take 40 mg by mouth daily.)     cyclobenzaprine (FLEXERIL) 10 MG tablet Take 10 mg by mouth every 8 (eight) hours as needed for muscle spasms.     gabapentin (NEURONTIN) 300 MG capsule Take 300 mg by mouth 2 (two) times daily.     insulin detemir (LEVEMIR) 100 UNIT/ML injection Inject 0.35 mLs (35 Units total) into the skin 2 (two) times daily. (Patient taking differently: Inject 40 Units into the skin daily.) 10 mL 11   melatonin 3 MG TABS tablet Place 1 tablet (3 mg total) into feeding tube at bedtime. (Patient taking differently: Take 3 mg by mouth at bedtime.)  0   metoprolol tartrate (LOPRESSOR) 50 MG tablet Place 1 tablet (50 mg total) into feeding tube 2 (two) times daily. (Patient taking differently: Take 50 mg by mouth 2 (two) times daily.)     Multiple Vitamin (MULTIVITAMIN WITH MINERALS) TABS tablet Place 1 tablet into feeding tube daily. (Patient taking differently: Take 1 tablet by mouth daily.)     RETIN-A 0.025 % cream Apply 1 application topically at bedtime.     sertraline (ZOLOFT) 25 MG tablet Place 1 tablet (25 mg total) into feeding tube daily. (Patient taking differently: Take 25 mg by mouth daily.)     amoxicillin (AMOXIL) 500 MG capsule Take 1 capsule (500 mg total) by mouth 3  (three) times daily. (Patient not taking: Reported on 07/12/2022) 30 capsule 0   BENZAMYCIN gel Apply 1 application topically in the morning and at bedtime. (Patient not taking: Reported on 07/12/2022)     insulin aspart (NOVOLOG) 100 UNIT/ML injection Inject 0-15 Units into the skin every 4 (four) hours. Correction coverage: Moderate (average weight, post-op)  CBG < 70: Implement Hypoglycemia Standing Orders and refer to Hypoglycemia Standing Orders sidebar report  CBG 70 - 120: 0 units  CBG 121 - 150: 2 units  CBG 151 - 200: 3 units  CBG 201 - 250: 5 units  CBG 251 - 300: 8 units  CBG 301 - 350: 11 units  CBG 351 - 400: 15 units  CBG > 400 call MD and obtain STAT lab verification (Patient not taking: No sig reported) 10 mL 11   insulin aspart (NOVOLOG) 100 UNIT/ML injection Inject 6 Units into the skin every 4 (four) hours. (Patient not taking: No sig reported) 10 mL 11   risperiDONE (RISPERDAL) 0.5 MG tablet Take 1 tablet (0.5 mg total) by mouth 2 (two) times daily as needed (agitation). 60 tablet 0   traMADol (ULTRAM) 50 MG tablet Take 1 tablet (50 mg total) by mouth 3 (three) times daily as needed. (Patient not taking: Reported on 07/12/2022) 9 tablet 0   TRULICITY 0.75 MG/0.5ML SOPN Inject 0.75 mg into the skin every Saturday. (Patient not taking: Reported on 07/12/2022)     valACYclovir (VALTREX) 1000 MG tablet Take 1,000 mg by mouth 2 (two) times daily. (Patient not taking: Reported on 07/12/2022)     valproic acid (DEPAKENE) 250 MG capsule Take 250 mg by mouth 2 (two) times daily. (Patient not taking: Reported on 07/12/2022)       Discharge Medications: Please see discharge summary for a list of discharge medications.  Relevant Imaging Results:  Relevant Lab Results:   Additional Information SSN [id number]  Darleene Cleaver, LCSW

## 2022-07-29 NOTE — ED Provider Notes (Signed)
-----------------------------------------   6:01 AM on 07/29/2022 -----------------------------------------   Blood pressure 116/86, pulse 97, temperature 98.4 F (36.9 C), temperature source Oral, resp. rate 18, height  (1.651 m), weight 84 kg, SpO2 97 %.  The patient is calm and cooperative at this time.  There have been no acute events since the last update.  Awaiting disposition plan from Social Work team.   Irean Hong, MD 07/29/22 [phone]

## 2022-07-29 NOTE — ED Notes (Signed)
Pt still sleeping. Pt aroused easily and encouraged to eat. Pt eating upon RN leaving room. Pt has no needs/requests at this time.

## 2022-07-29 NOTE — ED Notes (Signed)
Pt has been sleeping. Will assess and get vitals when she wakes up.

## 2022-07-29 NOTE — ED Provider Notes (Signed)
Emergency Medicine Observation Re-evaluation Note  Brandi Myers is a 38 y.o. female, seen on rounds today.  Pt initially presented to the ED for complaints of Psychiatric Evaluation  Currently, the patient is calm, no acute complaints.  Physical Exam  Blood pressure 105/71, pulse 93, temperature 98.4 F (36.9 C), temperature source Oral, resp. rate 18, height  (1.651 m), weight 84 kg, SpO2 98 %. Physical Exam General: NAD Lungs: CTAB Psych: not agitated  ED Course / MDM  EKG:    I have reviewed the labs performed to date as well as medications administered while in observation.  Recent changes in the last 24 hours include no acute events overnight.    Plan  Current plan is for  SW dispo.   Sharman Cheek, MD 07/29/22 [phone]

## 2022-07-29 NOTE — ED Notes (Signed)
vol/toc placement.. 

## 2022-07-29 NOTE — ED Notes (Signed)
DSS case worker at bedside 

## 2022-07-29 NOTE — TOC Progression Note (Signed)
Transition of Care [facility]) - Progression Note    Patient Details  Name: Brandi Myers MRN: [id number] Date of Birth: 01/31/1986  Transition of Care [facility]) CM/SW Contact  Darleene Cleaver, [place] Phone Number: 07/29/2022, 5:15 PM  Clinical Narrative:     APS worker requesting signed FL2 to be emailed to her.  TOC completed FL2 and emailed to APS worker Diona Fanti.  Expected Discharge Plan:  (TBD) Barriers to Discharge:  (placement - family abandonment)  Expected Discharge Plan and Services                                               Social Determinants of Health (SDOH) Interventions SDOH Screenings   Tobacco Use: High Risk (07/11/2022)    Readmission Risk Interventions     No data to display

## 2022-07-29 NOTE — ED Notes (Signed)
Pt on cell phone, meds given.  Pt eating dinner meal

## 2022-07-29 NOTE — ED Notes (Signed)
Patient awake, resting in bed. No distress noted at this time.

## 2022-07-29 NOTE — ED Notes (Signed)
Pt given diet sprite, graham crackers, and peanut butter. Pt in bed playing on her smartphone.

## 2022-07-29 NOTE — ED Notes (Signed)
Resumed care from [employer].  Pt alert, on cell phone.

## 2022-07-29 NOTE — ED Notes (Signed)
Patient resting in bed with eyes closed. Resp even, unlabored on RA. Appears sleeping. No distress noted at this time. °

## 2022-07-29 NOTE — ED Notes (Signed)
Patient alert, oriented with spontaneous regular respirations. Requesting diet sprite. Beverage provided per request. No additional needs at this time.

## 2022-07-29 NOTE — ED Notes (Signed)
VOL  TOC  PLACEMENT 

## 2022-07-30 LAB — CBG MONITORING, ED
Glucose-Capillary: 174 mg/dL — ABNORMAL HIGH (ref 70–99)
Glucose-Capillary: 178 mg/dL — ABNORMAL HIGH (ref 70–99)
Glucose-Capillary: 265 mg/dL — ABNORMAL HIGH (ref 70–99)
Glucose-Capillary: 208 mg/dL — ABNORMAL HIGH (ref 70–99)

## 2022-07-30 NOTE — ED Notes (Signed)
Patient given a diet soda with graham crackers and peanut butter.  Patient is resting comfortably at this time

## 2022-07-30 NOTE — ED Provider Notes (Signed)
-----------------------------------------   5:14 AM on 07/30/2022 -----------------------------------------   Blood pressure 103/73, pulse 97, temperature 97.7 F (36.5 C), temperature source Oral, resp. rate 18, height  (1.651 m), weight 84 kg, SpO2 95 %.  The patient is calm and cooperative at this time.  There have been no acute events since the last update.  Awaiting disposition plan from Social Work team.   Irean Hong, MD 07/30/22 [phone]

## 2022-07-30 NOTE — ED Notes (Signed)
Vol  pending  TOC  placement 

## 2022-07-30 NOTE — ED Notes (Signed)
Pt taken to shower with this RN and EDT, pt showered by self. Bed linen and room was cleaned and changed. Pt given crackers and diet sprite upon request. Pt appreciative of provided shower.

## 2022-07-31 LAB — CBG MONITORING, ED
Glucose-Capillary: 144 mg/dL — ABNORMAL HIGH (ref 70–99)
Glucose-Capillary: 191 mg/dL — ABNORMAL HIGH (ref 70–99)
Glucose-Capillary: 145 mg/dL — ABNORMAL HIGH (ref 70–99)
Glucose-Capillary: 151 mg/dL — ABNORMAL HIGH (ref 70–99)

## 2022-07-31 NOTE — ED Notes (Signed)
Vol  pending  TOC  placement 

## 2022-07-31 NOTE — ED Provider Notes (Signed)
-----------------------------------------   6:37 AM on 07/31/2022 -----------------------------------------   Blood pressure 108/82, pulse 94, temperature 97.9 F (36.6 C), temperature source Oral, resp. rate 18, height  (1.651 m), weight 84 kg, SpO2 96 %.  The patient is calm and cooperative at this time.  There have been no acute events since the last update.  Awaiting disposition plan from Social Work team.   Irean Hong, MD 07/31/22 [phone]

## 2022-07-31 NOTE — ED Notes (Signed)
vol/toc placement.. 

## 2022-08-01 LAB — CBG MONITORING, ED
Glucose-Capillary: 133 mg/dL — ABNORMAL HIGH (ref 70–99)
Glucose-Capillary: 169 mg/dL — ABNORMAL HIGH (ref 70–99)
Glucose-Capillary: 111 mg/dL — ABNORMAL HIGH (ref 70–99)
Glucose-Capillary: 174 mg/dL — ABNORMAL HIGH (ref 70–99)

## 2022-08-01 NOTE — ED Provider Notes (Signed)
-----------------------------------------   5:50 AM on 08/01/2022 -----------------------------------------   Blood pressure 105/67, pulse 81, temperature 98.3 F (36.8 C), temperature source Oral, resp. rate 20, height  (1.651 m), weight 84 kg, SpO2 96 %.  The patient is calm and cooperative at this time.  There have been no acute events since the last update.  Awaiting disposition plan from case management/social work.    Ebone Alcivar, Layla Maw, DO 08/01/22 [phone]

## 2022-08-01 NOTE — ED Notes (Addendum)
Pt brought into shower by RN Sydell Axon and EDT Faith. Helped pt shower and given clean gown to wear. New linens given and Pt resting comfortably in bed.  No other needs at this time.

## 2022-08-01 NOTE — TOC Progression Note (Signed)
Transition of Care [facility]) - Progression Note    Patient Details  Name: Brandi Myers MRN: [id number] Date of Birth: Sep 19, 1985  [facility]) CM/SW Contact  Darleene Cleaver, [place] Phone Number: 08/01/2022, 5:56 PM  Clinical Narrative:     Per APS worker Diona Fanti she is trying to work with patient and family to determine a disposition plan.  TOC to continue to follow patient's progress throughout discharge planning.  Expected Discharge Plan:  (TBD) Barriers to Discharge:  (placement - family abandonment)  Expected Discharge Plan and Services                                               Social Determinants of Health (SDOH) Interventions SDOH Screenings   Tobacco Use: High Risk (07/11/2022)    Readmission Risk Interventions     No data to display

## 2022-08-01 NOTE — ED Notes (Signed)
EDT delegated to check pts CBG. CBG 174

## 2022-08-01 NOTE — ED Notes (Signed)
Patient given graham crackers and diet gingerale.  °

## 2022-08-01 NOTE — ED Notes (Signed)
VOL/Pending TOC Placement 

## 2022-08-02 LAB — CBG MONITORING, ED
Glucose-Capillary: 167 mg/dL — ABNORMAL HIGH (ref 70–99)
Glucose-Capillary: 176 mg/dL — ABNORMAL HIGH (ref 70–99)
Glucose-Capillary: 205 mg/dL — ABNORMAL HIGH (ref 70–99)
Glucose-Capillary: 177 mg/dL — ABNORMAL HIGH (ref 70–99)

## 2022-08-02 NOTE — ED Provider Notes (Signed)
-----------------------------------------   10:46 AM on 08/02/2022 -----------------------------------------   Blood pressure (!) 96/57, pulse 77, temperature 97.8 F (36.6 C), temperature source Oral, resp. rate 17, height  (1.651 m), weight 84 kg, SpO2 96 %.  The patient is calm and cooperative at this time.  There have been no acute events since the last update.  Awaiting disposition plan from case management/social work.    Corena Herter, MD 08/02/22 1046

## 2022-08-02 NOTE — ED Notes (Signed)
Pt given menstrual pads as requested

## 2022-08-02 NOTE — ED Notes (Signed)
Pt eating breakfast tray 

## 2022-08-02 NOTE — ED Notes (Signed)
Report received, this RN now assuming care.  

## 2022-08-02 NOTE — ED Notes (Signed)
Answered pt call light. Pt asking for Ginger ale and graham crackers. Provided them to pt. Updated vitals. Pt denies needs at this time

## 2022-08-02 NOTE — ED Notes (Signed)
Rn answered call light. Pt given ginger ale and graham crackers.

## 2022-08-02 NOTE — ED Notes (Signed)
Pharmacy contacted for lipitor, pyxis out

## 2022-08-02 NOTE — ED Notes (Signed)
Pt using the bedside commode independently

## 2022-08-02 NOTE — ED Notes (Signed)
Pt spilled bedside commode of urine on the floor. RN cleaned it up dumped the urine that was left in the bucket approx . Pt given bath wipes. New socks given. Floor cleaned with purple wipes.

## 2022-08-02 NOTE — ED Notes (Signed)
Pt given diet ginger ale as requested

## 2022-08-02 NOTE — ED Notes (Signed)
TOC

## 2022-08-02 NOTE — ED Notes (Signed)
Pt given ginger ale and graham crackers. Denies further needs at this time

## 2022-08-03 LAB — CBG MONITORING, ED
Glucose-Capillary: 159 mg/dL — ABNORMAL HIGH (ref 70–99)
Glucose-Capillary: 177 mg/dL — ABNORMAL HIGH (ref 70–99)
Glucose-Capillary: 251 mg/dL — ABNORMAL HIGH (ref 70–99)
Glucose-Capillary: 166 mg/dL — ABNORMAL HIGH (ref 70–99)

## 2022-08-03 NOTE — ED Notes (Signed)
Crackers w/ peanut butter and chocolate milk provided per request. Pain addressed with PRN medication. Pt requesting gabapentin. Pt informed that the medication she is requesting will be administered when due approximately 2200. No further questions. Will continue to monitor. CB in reach and no distress.

## 2022-08-03 NOTE — ED Notes (Signed)
Provided pt with some graham crackers, no other needs at present

## 2022-08-03 NOTE — ED Provider Notes (Signed)
-----------------------------------------   6:37 AM on 08/03/2022 -----------------------------------------   Blood pressure 102/71, pulse 98, temperature 97.8 F (36.6 C), temperature source Oral, resp. rate 16, height  (1.651 m), weight 84 kg, SpO2 98 %.  The patient is calm and cooperative at this time.  There have been no acute events since the last update.  Awaiting disposition plan from case management/social work.    Landen Knoedler, Layla Maw, DO 08/03/22 [phone]

## 2022-08-04 LAB — CBG MONITORING, ED
Glucose-Capillary: 186 mg/dL — ABNORMAL HIGH (ref 70–99)
Glucose-Capillary: 201 mg/dL — ABNORMAL HIGH (ref 70–99)
Glucose-Capillary: 172 mg/dL — ABNORMAL HIGH (ref 70–99)
Glucose-Capillary: 114 mg/dL — ABNORMAL HIGH (ref 70–99)

## 2022-08-04 NOTE — ED Notes (Signed)
Patient received meal tray at this time.

## 2022-08-04 NOTE — ED Notes (Addendum)
Pt A&Ox4. Pt requested gown change. Cleaned bedside commode and bleached. Pt has no other complaints. Resting in bed in no distress and symmetrical unlabored breathing.

## 2022-08-04 NOTE — ED Notes (Signed)
Pt given gingerale as requested.

## 2022-08-04 NOTE — ED Notes (Signed)
Patient finished with meal tray. Pt urinated and had a BM at this time. Bedside commode emptied.

## 2022-08-04 NOTE — ED Notes (Signed)
VOL  TOC  PLACEMENT 

## 2022-08-04 NOTE — ED Notes (Signed)
Pt given lunch tray.

## 2022-08-04 NOTE — ED Notes (Signed)
vol/toc placement.. 

## 2022-08-04 NOTE — ED Notes (Signed)
Patient requests diet ginger ale at this time.

## 2022-08-04 NOTE — ED Notes (Signed)
Patient given underwear and pads at this time.

## 2022-08-04 NOTE — ED Notes (Signed)
Pharmacy contacted for missing meds

## 2022-08-04 NOTE — ED Notes (Addendum)
Pt requesting PRN pain medication and agitation meds with other 1700 meds.

## 2022-08-04 NOTE — ED Notes (Signed)
Pt ate 100% of breakfast.

## 2022-08-05 LAB — CBG MONITORING, ED
Glucose-Capillary: 153 mg/dL — ABNORMAL HIGH (ref 70–99)
Glucose-Capillary: 159 mg/dL — ABNORMAL HIGH (ref 70–99)
Glucose-Capillary: 200 mg/dL — ABNORMAL HIGH (ref 70–99)
Glucose-Capillary: 110 mg/dL — ABNORMAL HIGH (ref 70–99)

## 2022-08-05 NOTE — ED Notes (Signed)
This NT and Heather RN assisted patient to shower and gave patient shower supplies. Patient currently showering.

## 2022-08-05 NOTE — ED Provider Notes (Signed)
-----------------------------------------   5:52 AM on 08/05/2022 -----------------------------------------   Blood pressure 99/71, pulse 95, temperature 98.7 F (37.1 C), temperature source Oral, resp. rate 20, height  (1.651 m), weight 84 kg, SpO2 98 %.  The patient is calm and cooperative at this time.  There have been no acute events since the last update.  Awaiting disposition plan from Social Work team.   Irean Hong, MD 08/05/22 [phone]

## 2022-08-05 NOTE — ED Notes (Addendum)
Patient given snack at this time. Patient requested another soda and informed patient she is not allowed to have another pop tonight due to already having four today between morning and evening shift and pt asked to speak to charge nurse and have another nurse due to her not being able to have what she wants to drink.

## 2022-08-05 NOTE — ED Notes (Signed)
VOL/Pending Placement 

## 2022-08-05 NOTE — TOC Progression Note (Signed)
Transition of Care [facility]) - Progression Note    Patient Details  Name: Brandi Myers MRN: [id number] Date of Birth: 1985-11-08  Transition of Care [facility]) CM/SW Contact  Darolyn Rua, [place] Phone Number: 08/05/2022, 12:15 PM  Clinical Narrative:     DSS Diona Fanti reports she will visit today or tomorrow 4/24 to see if patient has made progress in identifying family members for her to stay with.   Expected Discharge Plan:  (TBD) Barriers to Discharge:  (placement - family abandonment)  Expected Discharge Plan and Services                                               Social Determinants of Health (SDOH) Interventions SDOH Screenings   Tobacco Use: High Risk (07/11/2022)    Readmission Risk Interventions     No data to display

## 2022-08-05 NOTE — ED Notes (Signed)
DSS at bedside. 

## 2022-08-05 NOTE — ED Notes (Signed)
Bed linen changed and room cleaned while pt in shower

## 2022-08-05 NOTE — ED Notes (Signed)
Patient given tray at this time.

## 2022-08-06 LAB — CBG MONITORING, ED
Glucose-Capillary: 155 mg/dL — ABNORMAL HIGH (ref 70–99)
Glucose-Capillary: 133 mg/dL — ABNORMAL HIGH (ref 70–99)
Glucose-Capillary: 208 mg/dL — ABNORMAL HIGH (ref 70–99)
Glucose-Capillary: 192 mg/dL — ABNORMAL HIGH (ref 70–99)
Glucose-Capillary: 158 mg/dL — ABNORMAL HIGH (ref 70–99)

## 2022-08-06 NOTE — ED Notes (Addendum)
Pt called out to get sweatshirt off of countertop to put on over gown. Pt then asked if this RN would make an exception and let her get another soda. I stated "no she may not have another soda you have had five sodas total from 0700 until this time." Pt states "you are hateful." I then stated "I have answered every frequent call light for each thing she has asked for when she has asked, I bleached/cleaned room yesterday, got each medicine she required and the PRN's when asked, chapstick, panties, pads, new linen, gowns, sweatshirt on and off, snacks and given her exceptional care for anything and everything extra she has needed, etc and we had no issues until she got denied sodas each time she is asked. Patient had blank stare of no response. When asked if I was wrong? Patient has nothing to say. I went on to explained to her she has diabetes and her sugar does not need to sky rocket and she has had more than enough soda for the past 24 hours." Pt also requested her sugar be taken again. I stated "I would be more than happy to take her BS level but that will not change in the fact of if she will get water." CBG was 155.

## 2022-08-06 NOTE — ED Provider Notes (Signed)
-----------------------------------------   8:19 AM on 08/06/2022 -----------------------------------------   Blood pressure 100/68, pulse 86, temperature 99.2 F (37.3 C), temperature source Oral, resp. rate 18, height 1.651 m ( ), weight 84 kg, SpO2 96 %.  The patient is calm and cooperative at this time.  There have been no acute events since the last update.  Awaiting disposition plan from [facility] team.   Loleta Rose, MD 08/06/22 [phone]

## 2022-08-06 NOTE — ED Notes (Signed)
Pt taking shower. Pt was given hygiene items and the following, 1 clean top, 1 clean bottom, with 1 pair of disposable underwear.  Pt changed out into clean clothing.  Staff disposed of all shower supplies.   

## 2022-08-06 NOTE — ED Notes (Signed)
vol/toc.... 

## 2022-08-06 NOTE — ED Notes (Addendum)
Went in to answer patient call light. Patient asked if we could have the same aggrement as my shift with her yesterday (due to her constantly requesting soda nonstop) that she would be able to receive two sodas. I asked this patient if she had any sodas already today and she stated "no". This RN asked previous RN Herbert Seta what she had and she stated 2 sodas and 1 chocolate milk. Informed patient she already had 2 today but I would allow her to have 2 more throughout my shift but that would be it and water the rest of the time. Gave patient one diet ginger ale at this time. Informed her she could only have one more.

## 2022-08-06 NOTE — ED Notes (Signed)
Patient given something to drink per request

## 2022-08-06 NOTE — ED Notes (Signed)
Assisted patient with putting her pants on.

## 2022-08-06 NOTE — ED Notes (Signed)
Patient requested another soda at this time despite knowing it is her last one. Asked patient if she is sure she wants another one now and understands she will only be able to drink water the rest of the night and she states "yes"

## 2022-08-06 NOTE — ED Notes (Signed)
Lunch tray provided. 

## 2022-08-07 LAB — CBG MONITORING, ED
Glucose-Capillary: 194 mg/dL — ABNORMAL HIGH (ref 70–99)
Glucose-Capillary: 232 mg/dL — ABNORMAL HIGH (ref 70–99)
Glucose-Capillary: 131 mg/dL — ABNORMAL HIGH (ref 70–99)
Glucose-Capillary: 178 mg/dL — ABNORMAL HIGH (ref 70–99)

## 2022-08-07 NOTE — ED Notes (Signed)
Pt requested wheelchair to get around room. RN unable to find appropriate wheelchair but pt accepted a recliner and is able to lock/unlock and move around in recliner well.

## 2022-08-07 NOTE — ED Notes (Signed)
Pt's visitor brought in [employer]. This RN has recommended pt on maintaining a stable glucose by decreasing carb and sugar intake.

## 2022-08-07 NOTE — ED Notes (Signed)
VOL/Pending TOC Placement 

## 2022-08-07 NOTE — ED Provider Notes (Signed)
Emergency Medicine Observation Re-evaluation Note  Physical Exam   BP 112/76   Pulse 98   Temp 98.1 F (36.7 C) (Oral)   Resp 15   Ht  (1.651 m)   Wt 84 kg   SpO2 99%   BMI 30.82 kg/m   Patient appears in no acute distress.  ED Course / MDM   No reported events during my shift at the time of this note.   Pt is awaiting dispo from SW   Pilar Jarvis MD    Pilar Jarvis, MD 08/07/22 0001

## 2022-08-08 LAB — CBG MONITORING, ED
Glucose-Capillary: 121 mg/dL — ABNORMAL HIGH (ref 70–99)
Glucose-Capillary: 213 mg/dL — ABNORMAL HIGH (ref 70–99)
Glucose-Capillary: 197 mg/dL — ABNORMAL HIGH (ref 70–99)
Glucose-Capillary: 265 mg/dL — ABNORMAL HIGH (ref 70–99)

## 2022-08-08 NOTE — ED Notes (Signed)
Pending TOC placement  

## 2022-08-08 NOTE — ED Provider Notes (Signed)
-----------------------------------------   2:44 AM on 08/08/2022 -----------------------------------------   Blood pressure 108/74, pulse 98, temperature 98.7 F (37.1 C), temperature source Oral, resp. rate 16, height 1.651 m (5\' 5" ), weight 84 kg, SpO2 99 %.  The patient is calm and cooperative at this time.  There have been no acute events since the last update.  Awaiting disposition plan from [facility] team.   Loleta Rose, MD 08/08/22 [phone]

## 2022-08-09 LAB — CBG MONITORING, ED
Glucose-Capillary: 144 mg/dL — ABNORMAL HIGH (ref 70–99)
Glucose-Capillary: 161 mg/dL — ABNORMAL HIGH (ref 70–99)
Glucose-Capillary: 153 mg/dL — ABNORMAL HIGH (ref 70–99)
Glucose-Capillary: 239 mg/dL — ABNORMAL HIGH (ref 70–99)

## 2022-08-09 MED ORDER — DIPHENHYDRAMINE HCL 25 MG PO CAPS
25.0000 mg | ORAL_CAPSULE | Freq: Once | ORAL | Status: AC
  Administered 2022-08-09: 25 mg via ORAL
  Filled 2022-08-09: qty 1

## 2022-08-09 NOTE — ED Notes (Addendum)
Patient given diet ginger ale per request. Patient upset that secretary asked patient what she needed help with when patient pressed call light and stated "I need my nurse."  Patient educated on the fact that she may not need a nurse for whatever she may need and her needs can be met by other staff.  Patient verbalized understanding and denies further needs.

## 2022-08-09 NOTE — ED Notes (Signed)
Patient assisted with putting on clean gown.  Patient denies anymore needs at this time.

## 2022-08-09 NOTE — ED Provider Notes (Signed)
-----------------------------------------   7:06 AM on 08/09/2022 -----------------------------------------   Blood pressure 101/68, pulse 84, temperature 97.9 F (36.6 C), resp. rate 15, height 5\' 5"  (1.651 m), weight 84 kg, SpO2 100 %.  The patient is calm and cooperative at this time.  There have been no acute events since the last update.  Awaiting disposition plan from Social Work team.   Irean Hong, MD 08/09/22 [phone]

## 2022-08-09 NOTE — ED Notes (Addendum)
Patient given feminine pads per request. Patient denies further needs at this time.

## 2022-08-09 NOTE — ED Notes (Signed)
Patient given a piece of computer paper per request.  Patient denies any further needs at this time.

## 2022-08-09 NOTE — ED Notes (Signed)
Patient's toilet emptied and new basin provided. Patient denies further needs at this time.

## 2022-08-09 NOTE — ED Notes (Signed)
Patient watching TikTok on phone, reports that she cannot have a snack right now due to her CBG.  Patient shows understanding of reducing carbs and sugars to keep CBG under control.

## 2022-08-09 NOTE — ED Notes (Signed)
Patient given evening snack of diet drink and graham crackers per request.  Patient informed she would be able to have 2 diet sodas tonight like she has been getting on previous shifts d/t her diabetes.  Patient verbalized understanding.

## 2022-08-09 NOTE — ED Notes (Signed)
Pt given breakfast tray and assisted with tray setup.

## 2022-08-10 LAB — CBG MONITORING, ED
Glucose-Capillary: 131 mg/dL — ABNORMAL HIGH (ref 70–99)
Glucose-Capillary: 161 mg/dL — ABNORMAL HIGH (ref 70–99)
Glucose-Capillary: 164 mg/dL — ABNORMAL HIGH (ref 70–99)
Glucose-Capillary: 160 mg/dL — ABNORMAL HIGH (ref 70–99)

## 2022-08-10 NOTE — ED Notes (Signed)
Pt cbg is 131, will notify nurse Casimiro Needle

## 2022-08-10 NOTE — ED Notes (Signed)
EDT assigned to answer pts call light. Pt asked EDT for a ginger ale. Pt is a known diabetic who regularly has issues with hyperglycemia. This EDT is not assigned this room, and does not know the status of this pts CBG today, so EDT instructed pt to ask her new RN after shift change because the night shift RN would have a better idea of what is best for the pt.   Pt was not happy with this, but nodded her head when asked if she understood the reasoning for no ginger ale right now.

## 2022-08-10 NOTE — ED Notes (Signed)
VOL/TOC Placement Pending  

## 2022-08-10 NOTE — ED Provider Notes (Signed)
7:53 PM Vitals:   08/09/22 0939 08/09/22 2130  BP: 91/60 107/76  Pulse: 99 98  Resp: 16 18  Temp: 98.3 F (36.8 C) 98 F (36.7 C)  SpO2: 96% 97%   Glucose last check was 164  The patient is calm and cooperative at this time.  There have been no acute events since the last update.  Awaiting disposition plan from Social Work team.     Concha Se, MD 08/10/22 [phone]

## 2022-08-11 DIAGNOSIS — F4325 Adjustment disorder with mixed disturbance of emotions and conduct: Secondary | ICD-10-CM | POA: Diagnosis not present

## 2022-08-11 LAB — VALPROIC ACID LEVEL: Valproic Acid Lvl: 26 ug/mL — ABNORMAL LOW (ref 50.0–100.0)

## 2022-08-11 LAB — CBG MONITORING, ED
Glucose-Capillary: 122 mg/dL — ABNORMAL HIGH (ref 70–99)
Glucose-Capillary: 156 mg/dL — ABNORMAL HIGH (ref 70–99)
Glucose-Capillary: 185 mg/dL — ABNORMAL HIGH (ref 70–99)
Glucose-Capillary: 169 mg/dL — ABNORMAL HIGH (ref 70–99)

## 2022-08-11 MED ORDER — HYDROXYZINE HCL 25 MG PO TABS
50.0000 mg | ORAL_TABLET | Freq: Three times a day (TID) | ORAL | Status: DC | PRN
  Administered 2022-08-12 – 2022-08-22 (×11): 50 mg via ORAL
  Filled 2022-08-11 (×11): qty 2

## 2022-08-11 MED ORDER — RISPERIDONE 1 MG PO TABS
0.5000 mg | ORAL_TABLET | Freq: Two times a day (BID) | ORAL | Status: DC
  Administered 2022-08-11 – 2022-08-22 (×23): 0.5 mg via ORAL
  Filled 2022-08-11 (×23): qty 1

## 2022-08-11 MED ORDER — SERTRALINE HCL 50 MG PO TABS
50.0000 mg | ORAL_TABLET | Freq: Every day | ORAL | Status: DC
  Administered 2022-08-12 – 2022-08-22 (×11): 50 mg via ORAL
  Filled 2022-08-11 (×11): qty 1

## 2022-08-11 NOTE — TOC Progression Note (Signed)
Transition of Care [facility]) - Progression Note    Patient Details  Name: Brandi Myers MRN: [id number] Date of Birth: [birth year]  Transition of Care [facility]) CM/SW Contact  Darleene Cleaver, [place] Phone Number: 08/11/2022, 11:11 AM  Clinical Narrative:     Still no updates on placement options for patient.   Expected Discharge Plan:  (TBD) Barriers to Discharge:  (placement - family abandonment)  Expected Discharge Plan and Services                                               Social Determinants of Health (SDOH) Interventions SDOH Screenings   Tobacco Use: High Risk (07/11/2022)    Readmission Risk Interventions     No data to display

## 2022-08-11 NOTE — TOC Progression Note (Addendum)
Transition of Care [facility]) - Progression Note    Patient Details  Name: Brandi Myers MRN: [id number] Date of Birth: [birth year]  Transition of Care [facility]) CM/SW Contact  Halford Chessman Phone Number: 08/11/2022, 10:33 AM  Clinical Narrative:     CSW spoke to Diona Fanti APS worker.  Per Verlon Au, patient should be receiving a disability back pay she is trying to work with family to allow her to come back home with her Uncle.  TOC was informed by Verlon Au to check with Samuel Germany, [phone] to see if she has any placement options for patient.  TOC continuing to try to find placement for patient.   Expected Discharge Plan:  (TBD) Barriers to Discharge:  (placement - family abandonment)  Expected Discharge Plan and Services                                               Social Determinants of Health (SDOH) Interventions SDOH Screenings   Tobacco Use: High Risk (07/11/2022)    Readmission Risk Interventions     No data to display

## 2022-08-11 NOTE — ED Provider Notes (Signed)
-----------------------------------------   6:28 AM on 08/11/2022 -----------------------------------------   Blood pressure 108/69, pulse 90, temperature 98.2 F (36.8 C), resp. rate 18, height 5\' 5"  (1.651 m), weight 84 kg, SpO2 98 %.  The patient is calm and cooperative at this time.  There have been no acute events since the last update.  Awaiting disposition plan from Social Work team.   Irean Hong, MD 08/11/22 [phone]

## 2022-08-11 NOTE — TOC Progression Note (Signed)
Transition of Care [facility]) - Progression Note    Patient Details  Name: Brandi Myers MRN: [id number] Date of Birth: Dec 13, 1985  [facility]) CM/SW Contact  Darleene Cleaver, [place] Phone Number: 08/11/2022, 11:09 AM  Clinical Narrative:     Per Diona Fanti at APS she is still following patient's progress and trying to help come up with a safe discharge plan.  Expected Discharge Plan:  (TBD) Barriers to Discharge:  (placement - family abandonment)  Expected Discharge Plan and Services                                               Social Determinants of Health (SDOH) Interventions SDOH Screenings   Tobacco Use: High Risk (07/11/2022)    Readmission Risk Interventions     No data to display

## 2022-08-11 NOTE — ED Notes (Signed)
VOL  TOC  PLACEMENT 
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
## 2022-08-11 NOTE — ED Notes (Signed)
Pt provided diet soda and fresh socks per request.

## 2022-08-11 NOTE — ED Notes (Signed)
Meal tray given 

## 2022-08-12 LAB — CBG MONITORING, ED
Glucose-Capillary: 114 mg/dL — ABNORMAL HIGH (ref 70–99)
Glucose-Capillary: 241 mg/dL — ABNORMAL HIGH (ref 70–99)
Glucose-Capillary: 119 mg/dL — ABNORMAL HIGH (ref 70–99)
Glucose-Capillary: 236 mg/dL — ABNORMAL HIGH (ref 70–99)

## 2022-08-12 NOTE — ED Notes (Signed)
EDT assigned to check pts CBG. CBG 119

## 2022-08-12 NOTE — ED Notes (Signed)
vol/toc.... 

## 2022-08-12 NOTE — ED Notes (Signed)
VOL/TOC Placement 

## 2022-08-12 NOTE — ED Notes (Signed)
EDT made pt aware of what time it was, per pts request. Pt declined having her door closed or lights turned off. Pt denied needing anything else at this time.

## 2022-08-12 NOTE — ED Notes (Signed)
Pt was given PM snack by this NT.

## 2022-08-13 LAB — CBG MONITORING, ED
Glucose-Capillary: 118 mg/dL — ABNORMAL HIGH (ref 70–99)
Glucose-Capillary: 117 mg/dL — ABNORMAL HIGH (ref 70–99)
Glucose-Capillary: 231 mg/dL — ABNORMAL HIGH (ref 70–99)
Glucose-Capillary: 224 mg/dL — ABNORMAL HIGH (ref 70–99)

## 2022-08-13 NOTE — ED Notes (Signed)
Pt given a cup of sprite at this time.

## 2022-08-13 NOTE — ED Notes (Signed)
Dinner tray provided

## 2022-08-13 NOTE — ED Notes (Signed)
Patient resting with eyes closed at this time. Respirations even and unlabored. NAD noted at this time.

## 2022-08-13 NOTE — ED Notes (Signed)
Pt is sleeping and has not touched lunch tray. Will give aspart 3 units scheduled if indicated once pt has eaten atleast 50% of food on tray, per order.

## 2022-08-13 NOTE — ED Notes (Signed)
Meds given.  Pt instructed not to drink regular soft drinks   pt to drink diet drinks only.  Pt alert, on cell phone.

## 2022-08-13 NOTE — ED Notes (Signed)
Report of to [facility]

## 2022-08-13 NOTE — ED Notes (Signed)
VOL TOC placement 

## 2022-08-13 NOTE — ED Notes (Signed)
Fsbs 231

## 2022-08-13 NOTE — ED Notes (Signed)
Provided sprite and fresh water with ice per request. Pt sitting comfortably in bed.

## 2022-08-13 NOTE — ED Notes (Signed)
Patient eating meal tray.   

## 2022-08-14 LAB — CBG MONITORING, ED
Glucose-Capillary: 151 mg/dL — ABNORMAL HIGH (ref 70–99)
Glucose-Capillary: 275 mg/dL — ABNORMAL HIGH (ref 70–99)
Glucose-Capillary: 165 mg/dL — ABNORMAL HIGH (ref 70–99)
Glucose-Capillary: 214 mg/dL — ABNORMAL HIGH (ref 70–99)

## 2022-08-14 NOTE — ED Notes (Signed)
vol/toc.... 

## 2022-08-14 NOTE — ED Notes (Signed)
Pt given diet ginger ale per request
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
## 2022-08-14 NOTE — ED Notes (Signed)
VOL/TOC Placement 

## 2022-08-14 NOTE — ED Provider Notes (Signed)
-----------------------------------------   7:55 AM on 08/14/2022 -----------------------------------------   Blood pressure 95/71, pulse (!) 104, temperature 97.9 F (36.6 C), temperature source Oral, resp. rate 19, height 1.651 m (5\' 5" ), weight 84 kg, SpO2 100 %.  The patient is calm and cooperative at this time.  There have been no acute events since the last update.  Awaiting disposition plan from [facility] team.   Loleta Rose, MD 08/14/22 [phone]
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
## 2022-08-15 LAB — CBG MONITORING, ED
Glucose-Capillary: 149 mg/dL — ABNORMAL HIGH (ref 70–99)
Glucose-Capillary: 179 mg/dL — ABNORMAL HIGH (ref 70–99)
Glucose-Capillary: 146 mg/dL — ABNORMAL HIGH (ref 70–99)
Glucose-Capillary: 212 mg/dL — ABNORMAL HIGH (ref 70–99)

## 2022-08-15 NOTE — ED Notes (Signed)
Pt woke from sleep and had small incontinence episode. Linen and gown changed. BSC emptied.

## 2022-08-15 NOTE — ED Provider Notes (Signed)
Emergency Medicine Observation Re-evaluation Note  Physical Exam   BP 112/83 (BP Location: Right Arm)   Pulse (!) 101   Temp 98.4 F (36.9 C) (Oral)   Resp 17   Ht 5\' 5"  (1.651 m)   Wt 84 kg   SpO2 99%   BMI 30.82 kg/m   Patient appears in no acute distress.  ED Course / MDM   No reported events during my shift at the time of this note.   Pt is awaiting dispo from SW   Pilar Jarvis MD    Pilar Jarvis, MD 08/15/22 [phone]

## 2022-08-16 LAB — CBG MONITORING, ED
Glucose-Capillary: 134 mg/dL — ABNORMAL HIGH (ref 70–99)
Glucose-Capillary: 237 mg/dL — ABNORMAL HIGH (ref 70–99)
Glucose-Capillary: 191 mg/dL — ABNORMAL HIGH (ref 70–99)
Glucose-Capillary: 145 mg/dL — ABNORMAL HIGH (ref 70–99)

## 2022-08-16 NOTE — ED Notes (Signed)
Patient given [place] sandwich, diet lemon-lime shasta, graham crackers, and peanut butter. Informed patient no more snacks this evening to avoid glucose spike.

## 2022-08-16 NOTE — ED Provider Notes (Signed)
-----------------------------------------   5:45 AM on 08/16/2022 -----------------------------------------   Blood pressure 98/64, pulse 88, temperature 98.2 F (36.8 C), temperature source Oral, resp. rate 18, height 5\' 5"  (1.651 m), weight 84 kg, SpO2 99 %.  The patient is calm and cooperative at this time.  There have been no acute events since the last update.  Awaiting disposition plan from Social Work team.   Irean Hong, MD 08/16/22 [phone]

## 2022-08-16 NOTE — ED Notes (Signed)
TOC

## 2022-08-16 NOTE — ED Notes (Signed)
Pt c/o anxiety and requested PRN med. Pulled and given.

## 2022-08-16 NOTE — ED Notes (Signed)
Patient is vol pending TOC placement 

## 2022-08-17 LAB — CBG MONITORING, ED
Glucose-Capillary: 161 mg/dL — ABNORMAL HIGH (ref 70–99)
Glucose-Capillary: 219 mg/dL — ABNORMAL HIGH (ref 70–99)
Glucose-Capillary: 158 mg/dL — ABNORMAL HIGH (ref 70–99)
Glucose-Capillary: 152 mg/dL — ABNORMAL HIGH (ref 70–99)
Glucose-Capillary: 229 mg/dL — ABNORMAL HIGH (ref 70–99)

## 2022-08-17 NOTE — ED Provider Notes (Signed)
Emergency Medicine Observation Re-evaluation Note  Physical Exam   BP 93/67 (BP Location: Left Arm)   Pulse 97   Temp 98.3 F (36.8 C) (Oral)   Resp 16   Ht 5\' 5"  (1.651 m)   Wt 84 kg   SpO2 97%   BMI 30.82 kg/m   Patient appears in no acute distress.  ED Course / MDM   No reported events during my shift at the time of this note.   Pt is awaiting dispo from SW   Pilar Jarvis MD    Pilar Jarvis, MD 08/17/22 [phone]

## 2022-08-17 NOTE — ED Notes (Signed)
Pt woken from  sleep for CBG. Assessed pain, pt reporting no change in pain scale s/p medication admin yet she quickly falls asleep during conversation with this RN. Respirations even and unlabored, skin AfE/WD. NAD. Call light is in reach, [facility].

## 2022-08-18 LAB — CBG MONITORING, ED
Glucose-Capillary: 156 mg/dL — ABNORMAL HIGH (ref 70–99)
Glucose-Capillary: 195 mg/dL — ABNORMAL HIGH (ref 70–99)
Glucose-Capillary: 139 mg/dL — ABNORMAL HIGH (ref 70–99)
Glucose-Capillary: 202 mg/dL — ABNORMAL HIGH (ref 70–99)

## 2022-08-18 MED ORDER — MUPIROCIN CALCIUM 2 % EX CREA
TOPICAL_CREAM | Freq: Two times a day (BID) | CUTANEOUS | Status: DC
  Filled 2022-08-18 (×4): qty 15

## 2022-08-18 NOTE — ED Notes (Addendum)
Assumed care from Dorian,RN. Pt resting comfortably in bed at this time. Pt given diet lemon lime shasta. Call light with in reach.

## 2022-08-18 NOTE — TOC Progression Note (Signed)
Transition of Care [facility]) - Progression Note    Patient Details  Name: Brandi Myers MRN: [id number] Date of Birth: January 04, 1986  Transition of Care [facility]) CM/SW Contact  Darleene Cleaver, [place] Phone Number: 08/18/2022, 5:51 PM  Clinical Narrative:     Verlon Au from APS  to talk with an old group home that patient used to live in and see if they can accept her.   Expected Discharge Plan:  (TBD) Barriers to Discharge:  (placement - family abandonment)  Expected Discharge Plan and Services                                               Social Determinants of Health (SDOH) Interventions SDOH Screenings   Tobacco Use: High Risk (07/11/2022)    Readmission Risk Interventions     No data to display

## 2022-08-18 NOTE — ED Notes (Signed)
This RN and Holiday representative took pt to decon shower to wash patient. Pt cleaned, room cleaned, new linens placed on bed, pt given new gown and hair brush. Pt denies additional needs or concerns at this time.

## 2022-08-18 NOTE — ED Notes (Signed)
Pt given PM snack.  

## 2022-08-18 NOTE — TOC Progression Note (Deleted)
Transition of Care [facility]) - Progression Note    Patient Details  Name: Brandi Myers MRN: [id number] Date of Birth: [birth year]  Transition of Care [facility]) CM/SW Contact  Darleene Cleaver, [place] Phone Number: 08/18/2022, 5:53 PM  Clinical Narrative:       Expected Discharge Plan:  (TBD) Barriers to Discharge:  (placement - family abandonment)  Expected Discharge Plan and Services                                               Social Determinants of Health (SDOH) Interventions SDOH Screenings   Tobacco Use: High Risk (07/11/2022)    Readmission Risk Interventions     No data to display

## 2022-08-18 NOTE — ED Provider Notes (Signed)
-----------------------------------------   5:37 AM on 08/18/2022 -----------------------------------------   Blood pressure 92/67, pulse 94, temperature 98.3 F (36.8 C), resp. rate 16, height 5\' 5"  (1.651 m), weight 84 kg, SpO2 98 %.  The patient is calm and cooperative at this time.  There have been no acute events since the last update.  Awaiting disposition plan from Social Work team.   Irean Hong, MD 08/18/22 [phone]

## 2022-08-19 LAB — CBG MONITORING, ED
Glucose-Capillary: 126 mg/dL — ABNORMAL HIGH (ref 70–99)
Glucose-Capillary: 196 mg/dL — ABNORMAL HIGH (ref 70–99)
Glucose-Capillary: 185 mg/dL — ABNORMAL HIGH (ref 70–99)
Glucose-Capillary: 157 mg/dL — ABNORMAL HIGH (ref 70–99)

## 2022-08-19 NOTE — ED Notes (Signed)
VOLUNTARY/pending TOC placement 

## 2022-08-20 LAB — CBG MONITORING, ED
Glucose-Capillary: 135 mg/dL — ABNORMAL HIGH (ref 70–99)
Glucose-Capillary: 147 mg/dL — ABNORMAL HIGH (ref 70–99)
Glucose-Capillary: 192 mg/dL — ABNORMAL HIGH (ref 70–99)
Glucose-Capillary: 136 mg/dL — ABNORMAL HIGH (ref 70–99)

## 2022-08-20 MED ORDER — MUPIROCIN 2 % EX OINT
TOPICAL_OINTMENT | Freq: Two times a day (BID) | CUTANEOUS | Status: DC
  Filled 2022-08-20: qty 22

## 2022-08-20 NOTE — ED Notes (Signed)
Pt on bedside commode, no assist required. Pt states "I Hope I can go home today." Pt asked if someone was going to pick her up today, pt states "I think so." Will speak with Dr, and DC if ride shows up.

## 2022-08-20 NOTE — ED Notes (Signed)
Asked pt if ride was on the way, pt stated "I don't think its happening anymore." Will cont to monitor pt.

## 2022-08-20 NOTE — ED Notes (Addendum)
Diona Fanti, APS, here to speak with pt.

## 2022-08-20 NOTE — ED Notes (Signed)
Pt given diet sprite and a cup of ice

## 2022-08-20 NOTE — ED Notes (Signed)
CBG [employer] notified.

## 2022-08-20 NOTE — ED Provider Notes (Signed)
Emergency Medicine Observation Re-evaluation Note  Physical Exam   BP 97/60   Pulse 91   Temp 98.3 F (36.8 C)   Resp 15   Ht 5\' 5"  (1.651 m)   Wt 84 kg   SpO2 96%   BMI 30.82 kg/m   Patient appears in no acute distress.  ED Course / MDM   No reported events during my shift at the time of this note.   Pt is awaiting dispo from SW   Pilar Jarvis MD    Pilar Jarvis, MD 08/20/22 [phone]

## 2022-08-20 NOTE — ED Notes (Signed)
Pt bedside commode emptied, cleaned, and replaced in room. Pt denies any other needs at this time.

## 2022-08-20 NOTE — ED Notes (Signed)
vol/toc.... 

## 2022-08-20 NOTE — ED Notes (Signed)
Pt given breakfast tray, this RN helped to open packets and prepare food for pt.

## 2022-08-20 NOTE — ED Notes (Signed)
Snack given.

## 2022-08-20 NOTE — ED Provider Notes (Signed)
-----------------------------------------   6:18 AM on 08/20/2022 -----------------------------------------   Blood pressure 97/60, pulse 91, temperature 98.3 F (36.8 C), resp. rate 15, height 5\' 5"  (1.651 m), weight 84 kg, SpO2 96 %.  The patient is calm and cooperative at this time.  There have been no acute events since the last update.  Awaiting disposition plan from Social Work team.   Irean Hong, MD 08/20/22 [phone]

## 2022-08-20 NOTE — ED Notes (Signed)
Pt given lunch tray.

## 2022-08-20 NOTE — ED Notes (Signed)
Pt asking for lights turned off and warm blankets. Provided blankets and turned off light, pt wants to take a nap.

## 2022-08-21 LAB — CBG MONITORING, ED
Glucose-Capillary: 151 mg/dL — ABNORMAL HIGH (ref 70–99)
Glucose-Capillary: 306 mg/dL — ABNORMAL HIGH (ref 70–99)
Glucose-Capillary: 214 mg/dL — ABNORMAL HIGH (ref 70–99)
Glucose-Capillary: 333 mg/dL — ABNORMAL HIGH (ref 70–99)

## 2022-08-21 NOTE — ED Notes (Signed)
VOL/Still Pending TOC Placement 

## 2022-08-21 NOTE — TOC Progression Note (Signed)
Transition of Care [facility]) - Progression Note    Patient Details  Name: Brandi Myers MRN: [id number] Date of Birth: [birth year]  Transition of Care [facility]) CM/SW Contact  Darleene Cleaver, [place] Phone Number: 08/21/2022, 10:28 AM  Clinical Narrative:     CSW spoke to Diona Fanti from APS, she stated that she spoke with patient yesterday and that patient's uncle had agreed to have her return back to the house.  Per Verlon Au, patient also has received her disability check and can afford a cab ride to leave if she can't get a ride to where she is staying.  Patient was supposed to discharge yesterday, but per nurse note, patient' said she did not think it was going to happen anymore.  Verlon Au was going to speak to patient again today to get an update on her plan for discharge.   Expected Discharge Plan:  (TBD) Barriers to Discharge:  (placement - family abandonment)  Expected Discharge Plan and Services                                               Social Determinants of Health (SDOH) Interventions SDOH Screenings   Tobacco Use: High Risk (07/11/2022)    Readmission Risk Interventions     No data to display

## 2022-08-22 LAB — CBG MONITORING, ED
Glucose-Capillary: 170 mg/dL — ABNORMAL HIGH (ref 70–99)
Glucose-Capillary: 202 mg/dL — ABNORMAL HIGH (ref 70–99)
Glucose-Capillary: 170 mg/dL — ABNORMAL HIGH (ref 70–99)
Glucose-Capillary: 214 mg/dL — ABNORMAL HIGH (ref 70–99)

## 2022-08-22 MED ORDER — CALCIUM CARBONATE ANTACID 500 MG PO CHEW
1.0000 | CHEWABLE_TABLET | Freq: Once | ORAL | Status: AC
  Administered 2022-08-22: 200 mg via ORAL
  Filled 2022-08-22: qty 1

## 2022-08-22 NOTE — ED Provider Notes (Signed)
Emergency Medicine Observation Re-evaluation Note  Brandi Myers is a 37 y.o. female, seen on rounds today.  Pt initially presented to the ED for complaints of Psychiatric Evaluation Currently, the patient is resting comfortably.  Physical Exam  BP 121/83   Pulse 99   Temp 99.2 F (37.3 C)   Resp 18   Ht 5\' 5"  (1.651 m)   Wt 84 kg   SpO2 100%   BMI 30.82 kg/m  Physical Exam General: No acute distress Cardiac: Well-perfused extremities Lungs: No respiratory distress Psych: Appropriate mood and affect  ED Course / MDM  EKG:   I have reviewed the labs performed to date as well as medications administered while in observation.  Recent changes in the last 24 hours include none.  Plan  Current plan is for placement.    Merwyn Katos, MD 08/22/22 [phone]

## 2022-08-22 NOTE — ED Notes (Signed)
This RN communicated suspicion of BC powder ingestion of 3,000 mg aspirin by pt to Modesto Charon, MD. Pt denies ingestion, RN removed 3 empty wrappers from room. Contacted poison control. Danelle Earthly with poison control stated that 3,000 mg would not be a toxic dose based on patient's weight, estimated the dose at 35 mg/kg, and said a toxic dose would be around 200 mg/kg. Poison control stated she "would call in a few hours and check on pt's condition." Modesto Charon, MD, made aware.

## 2022-08-22 NOTE — TOC Progression Note (Signed)
Transition of Care [facility]) - Progression Note    Patient Details  Name: Brandi Myers MRN: [id number] Date of Birth: 01-09-1986  Transition of Care [facility]) CM/SW Contact  Darleene Cleaver, [place] Phone Number: 08/22/2022, 12:50 PM  Clinical Narrative:    Per APS patient was supposed to discharge back to her gerandmother's house yesterday.  Patient did not dishcharge, per patient she spoke to her grandmother, and they don't have room for.  CSW [facility] DSS to see if they had any updates on plan for patient.  TOC to continue to follow patient's progress throughout discharge planning.   Expected Discharge Plan:  (TBD) Barriers to Discharge:  (placement - family abandonment)  Expected Discharge Plan and Services                                               Social Determinants of Health (SDOH) Interventions SDOH Screenings   Tobacco Use: High Risk (07/11/2022)    Readmission Risk Interventions     No data to display

## 2022-08-22 NOTE — ED Notes (Signed)
VOL/Pending TOC Placement 

## 2022-08-22 NOTE — ED Notes (Signed)
Pt called out requesting graham crackers and peanut butter. Upon entering room empty dinner tray noted as well as empty fast food bags and drinks. RN provided pt with 6 packs of graham crackers and peanut butter as requested by pt. RN opened peanut butter packets for pt per request. Pt currently sitting up in bed playing on phone and watching tv. Pt noted to be breathing unlabored speaking in full sentences with symmetric chest rise and fall. Pt complaining that the prior nurse took her [facility] powders out of the room and states that she wants one for her back pain. RN reiterated that pt is to only be taking medications provided by the hospital while here so that staff can ensure her safety. Pt resistant to this education and still requests BC powders. RN states that she can ask the doctor for some ibuprofen or pt can take the tylenol that is ordered for her. Pt requests the ordered tylenol.

## 2022-08-22 NOTE — ED Notes (Signed)
Removed 26 BC powders from patient's room. Placed in clear bag with pt label and taken to [facility].   Patient unhappy with medication being removed from her room and stated "you can't take my shit." Pt educated that staff needs to administer any medication she receives. Pt asked if she would be able to have one when she wanted. Pt educated that it would be up to the doctor when she received medications.

## 2022-08-22 NOTE — ED Notes (Signed)
Breakfast meal tray given at this time. Setup assistance needed.

## 2022-08-22 NOTE — Inpatient Diabetes Management (Signed)
Inpatient Diabetes Program Recommendations  AACE/ADA: New Consensus Statement on Inpatient Glycemic Control  Target Ranges:  Prepandial:   less than 140 mg/dL      Peak postprandial:   less than 180 mg/dL (1-2 hours)      Critically ill patients:  140 - 180 mg/dL    Latest Reference Range & Units 08/21/22 08:10 08/21/22 12:14 08/21/22 16:19 08/21/22 22:10  Glucose-Capillary 70 - 99 mg/dL 191 (H) 478 (H) 295 (H) 333 (H)   Review of Glycemic Control Current orders for Inpatient glycemic control: Levemir 40 units daily, Novolog 3 units TID with meals, Novolog 0-15 units TID with meals, Novolog 0-5 units QHS  Inpatient Diabetes Program Recommendations:    Insulin: Please consider increasing meal coverage to Novolog 7 units TID with meals.   Thanks, Orlando Penner, RN, MSN, CDCES Diabetes Coordinator Inpatient Diabetes Program [phone] (Team Pager from 8am to 5pm)

## 2022-08-22 NOTE — ED Notes (Addendum)
Report received from Keyri, RN 

## 2022-08-23 NOTE — ED Notes (Signed)
Pt declined final set of vitals. Pt friend here to take pt home and states pt can stay with her.   Patient discharged at this time. Wheeled to lobby and assisted into vehicle. Breathing unlabored speaking in full sentences. Verbalized understanding of all discharge, follow up, and medication teaching. Discharged homed with all belongings.

## 2022-08-23 NOTE — ED Provider Notes (Signed)
-----------------------------------------   1:44 AM on 08/23/2022 -----------------------------------------   Patient has found a friend to pick her up and is going to stay with that friend. Verified friend's presence. Patient is not under IVC nor does she have a legal guardian. She has the capacity to make her own decisions. Strict return precautions given. Patient verbalizes understanding and agrees with plan of care.   Irean Hong, MD 08/23/22 [phone]

## 2022-08-23 NOTE — ED Notes (Signed)
Patient called out and told RN that she has located a place to stay with a friend and that that friend is on her way to pick her up. RN informed pt she would speak with MD regarding possible discharge. MD states that should a secure ride come to the hospital and endorse the plan for pt to live with her, MD will discharge the patient at that time. Pt made aware and pt states friend is en route from [place].

## 2022-08-23 NOTE — Discharge Instructions (Addendum)
Take your medicines as directed by your doctor.  Return to the ER for worsening symptoms, feelings of hurting yourself or others, or other concerns. 

## 2022-09-18 ENCOUNTER — Encounter: Payer: Self-pay | Admitting: Emergency Medicine

## 2022-09-18 ENCOUNTER — Inpatient Hospital Stay
Admission: EM | Admit: 2022-09-18 | Discharge: 2022-10-06 | DRG: 163 | Disposition: A | Discharge status: Home / Self Care | Payer: Medicaid Other | Attending: Obstetrics and Gynecology | Admitting: Obstetrics and Gynecology

## 2022-09-18 ENCOUNTER — Emergency Department: Payer: Medicaid Other

## 2022-09-18 ENCOUNTER — Encounter
Admission: EM | Disposition: A | Discharge status: Home / Self Care | Payer: Self-pay | Source: Home / Self Care | Attending: Internal Medicine

## 2022-09-18 ENCOUNTER — Inpatient Hospital Stay: Payer: Medicaid Other

## 2022-09-18 ENCOUNTER — Inpatient Hospital Stay (HOSPITAL_COMMUNITY)
Admit: 2022-09-18 | Discharge: 2022-09-18 | Disposition: A | Discharge status: Home / Self Care | Payer: Medicaid Other | Attending: Pulmonary Disease | Admitting: Pulmonary Disease

## 2022-09-18 DIAGNOSIS — Z833 Family history of diabetes mellitus: Secondary | ICD-10-CM | POA: Diagnosis not present

## 2022-09-18 DIAGNOSIS — Z7985 Long-term (current) use of injectable non-insulin antidiabetic drugs: Secondary | ICD-10-CM

## 2022-09-18 DIAGNOSIS — R578 Other shock: Secondary | ICD-10-CM | POA: Diagnosis not present

## 2022-09-18 DIAGNOSIS — I69398 Other sequelae of cerebral infarction: Secondary | ICD-10-CM

## 2022-09-18 DIAGNOSIS — N920 Excessive and frequent menstruation with regular cycle: Secondary | ICD-10-CM | POA: Diagnosis present

## 2022-09-18 DIAGNOSIS — R2242 Localized swelling, mass and lump, left lower limb: Secondary | ICD-10-CM | POA: Diagnosis not present

## 2022-09-18 DIAGNOSIS — Z6838 Body mass index (BMI) 38.0-38.9, adult: Secondary | ICD-10-CM

## 2022-09-18 DIAGNOSIS — E1165 Type 2 diabetes mellitus with hyperglycemia: Secondary | ICD-10-CM | POA: Diagnosis present

## 2022-09-18 DIAGNOSIS — I69354 Hemiplegia and hemiparesis following cerebral infarction affecting left non-dominant side: Secondary | ICD-10-CM

## 2022-09-18 DIAGNOSIS — F1490 Cocaine use, unspecified, uncomplicated: Secondary | ICD-10-CM | POA: Diagnosis present

## 2022-09-18 DIAGNOSIS — Z1152 Encounter for screening for COVID-19: Secondary | ICD-10-CM | POA: Diagnosis not present

## 2022-09-18 DIAGNOSIS — E876 Hypokalemia: Secondary | ICD-10-CM | POA: Diagnosis present

## 2022-09-18 DIAGNOSIS — Z7901 Long term (current) use of anticoagulants: Secondary | ICD-10-CM

## 2022-09-18 DIAGNOSIS — I2602 Saddle embolus of pulmonary artery with acute cor pulmonale: Secondary | ICD-10-CM | POA: Diagnosis present

## 2022-09-18 DIAGNOSIS — D62 Acute posthemorrhagic anemia: Secondary | ICD-10-CM | POA: Diagnosis present

## 2022-09-18 DIAGNOSIS — F17210 Nicotine dependence, cigarettes, uncomplicated: Secondary | ICD-10-CM | POA: Diagnosis present

## 2022-09-18 DIAGNOSIS — M6281 Muscle weakness (generalized): Secondary | ICD-10-CM

## 2022-09-18 DIAGNOSIS — G4733 Obstructive sleep apnea (adult) (pediatric): Secondary | ICD-10-CM | POA: Diagnosis present

## 2022-09-18 DIAGNOSIS — R570 Cardiogenic shock: Secondary | ICD-10-CM | POA: Diagnosis present

## 2022-09-18 DIAGNOSIS — I2699 Other pulmonary embolism without acute cor pulmonale: Secondary | ICD-10-CM | POA: Diagnosis present

## 2022-09-18 DIAGNOSIS — Z79899 Other long term (current) drug therapy: Secondary | ICD-10-CM

## 2022-09-18 DIAGNOSIS — Z885 Allergy status to narcotic agent status: Secondary | ICD-10-CM

## 2022-09-18 DIAGNOSIS — Z993 Dependence on wheelchair: Secondary | ICD-10-CM

## 2022-09-18 DIAGNOSIS — Z794 Long term (current) use of insulin: Secondary | ICD-10-CM

## 2022-09-18 DIAGNOSIS — I2609 Other pulmonary embolism with acute cor pulmonale: Secondary | ICD-10-CM | POA: Diagnosis not present

## 2022-09-18 DIAGNOSIS — D509 Iron deficiency anemia, unspecified: Secondary | ICD-10-CM

## 2022-09-18 DIAGNOSIS — Z86718 Personal history of other venous thrombosis and embolism: Secondary | ICD-10-CM | POA: Diagnosis not present

## 2022-09-18 DIAGNOSIS — F4325 Adjustment disorder with mixed disturbance of emotions and conduct: Secondary | ICD-10-CM | POA: Diagnosis present

## 2022-09-18 DIAGNOSIS — I50810 Right heart failure, unspecified: Secondary | ICD-10-CM

## 2022-09-18 DIAGNOSIS — G4734 Idiopathic sleep related nonobstructive alveolar hypoventilation: Secondary | ICD-10-CM | POA: Insufficient documentation

## 2022-09-18 DIAGNOSIS — J45909 Unspecified asthma, uncomplicated: Secondary | ICD-10-CM | POA: Diagnosis present

## 2022-09-18 DIAGNOSIS — J9601 Acute respiratory failure with hypoxia: Secondary | ICD-10-CM | POA: Diagnosis present

## 2022-09-18 DIAGNOSIS — E669 Obesity, unspecified: Secondary | ICD-10-CM | POA: Diagnosis present

## 2022-09-18 DIAGNOSIS — F1290 Cannabis use, unspecified, uncomplicated: Secondary | ICD-10-CM | POA: Diagnosis present

## 2022-09-18 DIAGNOSIS — E119 Type 2 diabetes mellitus without complications: Secondary | ICD-10-CM

## 2022-09-18 DIAGNOSIS — D508 Other iron deficiency anemias: Secondary | ICD-10-CM | POA: Diagnosis not present

## 2022-09-18 DIAGNOSIS — I10 Essential (primary) hypertension: Secondary | ICD-10-CM | POA: Diagnosis present

## 2022-09-18 DIAGNOSIS — I82403 Acute embolism and thrombosis of unspecified deep veins of lower extremity, bilateral: Secondary | ICD-10-CM | POA: Diagnosis not present

## 2022-09-18 DIAGNOSIS — E272 Addisonian crisis: Secondary | ICD-10-CM | POA: Insufficient documentation

## 2022-09-18 DIAGNOSIS — I6930 Unspecified sequelae of cerebral infarction: Secondary | ICD-10-CM

## 2022-09-18 DIAGNOSIS — Z7982 Long term (current) use of aspirin: Secondary | ICD-10-CM

## 2022-09-18 HISTORY — PX: PULMONARY THROMBECTOMY: CATH118295

## 2022-09-18 LAB — POC URINE PREG, ED: Preg Test, Ur: NEGATIVE

## 2022-09-18 LAB — IRON AND TIBC
Iron: 21 ug/dL — ABNORMAL LOW (ref 28–170)
Saturation Ratios: 4 % — ABNORMAL LOW (ref 10.4–31.8)
TIBC: 515 ug/dL — ABNORMAL HIGH (ref 250–450)
UIBC: 494 ug/dL

## 2022-09-18 LAB — TYPE AND SCREEN

## 2022-09-18 LAB — CBC
HCT: 31.9 % — ABNORMAL LOW (ref 36.0–46.0)
Hemoglobin: 8.4 g/dL — ABNORMAL LOW (ref 12.0–15.0)
MCH: 17.8 pg — ABNORMAL LOW (ref 26.0–34.0)
MCHC: 26.3 g/dL — ABNORMAL LOW (ref 30.0–36.0)
MCV: 67.7 fL — ABNORMAL LOW (ref 80.0–100.0)
Platelets: 232 10*3/uL (ref 150–400)
RBC: 4.71 MIL/uL (ref 3.87–5.11)
RDW: 20.6 % — ABNORMAL HIGH (ref 11.5–15.5)
WBC: 8.9 10*3/uL (ref 4.0–10.5)
nRBC: 0 % (ref 0.0–0.2)

## 2022-09-18 LAB — BASIC METABOLIC PANEL
Anion gap: 11 (ref 5–15)
BUN: 9 mg/dL (ref 6–20)
CO2: 25 mmol/L (ref 22–32)
Calcium: 8.5 mg/dL — ABNORMAL LOW (ref 8.9–10.3)
Chloride: 100 mmol/L (ref 98–111)
Creatinine, Ser: 0.78 mg/dL (ref 0.44–1.00)
GFR, Estimated: >60 mL/min (ref >60)
Glucose, Bld: 302 mg/dL — ABNORMAL HIGH (ref 70–99)
Potassium: 3.4 mmol/L — ABNORMAL LOW (ref 3.5–5.1)
Sodium: 136 mmol/L (ref 135–145)

## 2022-09-18 LAB — PROTIME-INR
INR: 1.2 (ref 0.8–1.2)
Prothrombin Time: 15.2 seconds (ref 11.4–15.2)

## 2022-09-18 LAB — TROPONIN I (HIGH SENSITIVITY)
Troponin I (High Sensitivity): 10 ng/L (ref <18)
Troponin I (High Sensitivity): 10 ng/L (ref <18)
Troponin I (High Sensitivity): 15 ng/L (ref <18)

## 2022-09-18 LAB — ECHOCARDIOGRAM COMPLETE
AR max vel: 2.81 cm2
AV Area VTI: 3.1 cm2
AV Area mean vel: 2.68 cm2
AV Mean grad: 4 mmHg
AV Peak grad: 7.5 mmHg
Ao pk vel: 1.37 m/s
Area-P 1/2: 3.2 cm2
Height: 65 in
S' Lateral: 1.8 cm
Weight: 3680 oz

## 2022-09-18 LAB — HIV ANTIBODY (ROUTINE TESTING W REFLEX): HIV Screen 4th Generation wRfx: NONREACTIVE

## 2022-09-18 LAB — GLUCOSE, CAPILLARY
Glucose-Capillary: 207 mg/dL — ABNORMAL HIGH (ref 70–99)
Glucose-Capillary: 221 mg/dL — ABNORMAL HIGH (ref 70–99)

## 2022-09-18 LAB — APTT: aPTT: 35 seconds (ref 24–36)

## 2022-09-18 LAB — SARS CORONAVIRUS 2 BY RT PCR: SARS Coronavirus 2 by RT PCR: NEGATIVE

## 2022-09-18 LAB — FERRITIN: Ferritin: 10 ng/mL — ABNORMAL LOW (ref 11–307)

## 2022-09-18 LAB — CBG MONITORING, ED: Glucose-Capillary: 261 mg/dL — ABNORMAL HIGH (ref 70–99)

## 2022-09-18 LAB — D-DIMER, QUANTITATIVE: D-Dimer, Quant: 8.1 ug/mL-FEU — ABNORMAL HIGH (ref 0.00–0.50)

## 2022-09-18 LAB — HEPARIN LEVEL (UNFRACTIONATED): Heparin Unfractionated: <0.1 IU/mL — ABNORMAL LOW (ref 0.30–0.70)

## 2022-09-18 SURGERY — PULMONARY THROMBECTOMY
Anesthesia: Moderate Sedation | Laterality: Bilateral

## 2022-09-18 MED ORDER — SODIUM CHLORIDE 0.9 % IV BOLUS
1000.0000 mL | Freq: Once | INTRAVENOUS | Status: AC
  Administered 2022-09-18: 1000 mL via INTRAVENOUS

## 2022-09-18 MED ORDER — MIDAZOLAM HCL 2 MG/2ML IJ SOLN
INTRAMUSCULAR | Status: AC
  Filled 2022-09-18: qty 2

## 2022-09-18 MED ORDER — CEFAZOLIN SODIUM-DEXTROSE 2-4 GM/100ML-% IV SOLN
2.0000 g | INTRAVENOUS | Status: AC
  Administered 2022-09-18: 2 g via INTRAVENOUS

## 2022-09-18 MED ORDER — HEPARIN (PORCINE) 25000 UT/250ML-% IV SOLN
1900.0000 [IU]/h | INTRAVENOUS | Status: DC
  Administered 2022-09-20 – 2022-09-21 (×2): 1900 [IU]/h via INTRAVENOUS
  Filled 2022-09-18 (×4): qty 250

## 2022-09-18 MED ORDER — SODIUM CHLORIDE 0.9 % IV SOLN
250.0000 mL | INTRAVENOUS | Status: DC
  Administered 2022-09-18: 250 mL via INTRAVENOUS

## 2022-09-18 MED ORDER — FENTANYL CITRATE (PF) 100 MCG/2ML IJ SOLN
INTRAMUSCULAR | Status: AC
  Filled 2022-09-18: qty 2

## 2022-09-18 MED ORDER — ALTEPLASE 2 MG IJ SOLR
INTRAMUSCULAR | Status: AC
  Filled 2022-09-18: qty 10

## 2022-09-18 MED ORDER — HEPARIN SODIUM (PORCINE) 1000 UNIT/ML IJ SOLN
INTRAMUSCULAR | Status: AC
  Filled 2022-09-18: qty 10

## 2022-09-18 MED ORDER — LACTATED RINGERS IV SOLN: INTRAVENOUS | Status: DC

## 2022-09-18 MED ORDER — HEPARIN BOLUS VIA INFUSION
5000.0000 [IU] | Freq: Once | INTRAVENOUS | Status: AC
  Administered 2022-09-18: 5000 [IU] via INTRAVENOUS
  Filled 2022-09-18: qty 5000

## 2022-09-18 MED ORDER — INSULIN ASPART 100 UNIT/ML IJ SOLN
0.0000 [IU] | INTRAMUSCULAR | Status: DC
  Administered 2022-09-18: 5 [IU] via SUBCUTANEOUS
  Administered 2022-09-18: 8 [IU] via SUBCUTANEOUS
  Administered 2022-09-19: 5 [IU] via SUBCUTANEOUS
  Administered 2022-09-19 (×3): 3 [IU] via SUBCUTANEOUS
  Administered 2022-09-19: 5 [IU] via SUBCUTANEOUS
  Filled 2022-09-18 (×8): qty 1

## 2022-09-18 MED ORDER — MIDAZOLAM HCL 2 MG/2ML IJ SOLN
INTRAMUSCULAR | Status: DC | PRN
  Administered 2022-09-18: 2 mg via INTRAVENOUS
  Administered 2022-09-18: .5 mg via INTRAVENOUS

## 2022-09-18 MED ORDER — IOHEXOL 350 MG/ML SOLN
75.0000 mL | Freq: Once | INTRAVENOUS | Status: AC | PRN
  Administered 2022-09-18: 75 mL via INTRAVENOUS

## 2022-09-18 MED ORDER — FENTANYL CITRATE PF 50 MCG/ML IJ SOSY: 12.5000 ug | PREFILLED_SYRINGE | Freq: Once | INTRAMUSCULAR | Status: DC | PRN

## 2022-09-18 MED ORDER — GABAPENTIN 100 MG PO CAPS
200.0000 mg | ORAL_CAPSULE | Freq: Two times a day (BID) | ORAL | Status: DC
  Administered 2022-09-18 – 2022-10-06 (×36): 200 mg via ORAL
  Filled 2022-09-18 (×36): qty 2

## 2022-09-18 MED ORDER — POLYETHYLENE GLYCOL 3350 17 G PO PACK
17.0000 g | PACK | Freq: Every day | ORAL | Status: DC | PRN
  Administered 2022-10-03: 17 g via ORAL
  Filled 2022-09-18: qty 1

## 2022-09-18 MED ORDER — CEFAZOLIN SODIUM-DEXTROSE 2-4 GM/100ML-% IV SOLN
INTRAVENOUS | Status: AC
  Filled 2022-09-18: qty 100

## 2022-09-18 MED ORDER — DIPHENHYDRAMINE HCL 50 MG/ML IJ SOLN: 50.0000 mg | Freq: Once | INTRAMUSCULAR | Status: DC | PRN

## 2022-09-18 MED ORDER — FENTANYL CITRATE (PF) 100 MCG/2ML IJ SOLN
INTRAMUSCULAR | Status: DC | PRN
  Administered 2022-09-18: 50 ug via INTRAVENOUS

## 2022-09-18 MED ORDER — FENTANYL CITRATE PF 50 MCG/ML IJ SOSY
PREFILLED_SYRINGE | INTRAMUSCULAR | Status: AC
  Filled 2022-09-18: qty 1

## 2022-09-18 MED ORDER — BENZONATATE 100 MG PO CAPS
100.0000 mg | ORAL_CAPSULE | Freq: Once | ORAL | Status: AC
  Administered 2022-09-18: 100 mg via ORAL
  Filled 2022-09-18: qty 1

## 2022-09-18 MED ORDER — POTASSIUM CHLORIDE 10 MEQ/100ML IV SOLN
10.0000 meq | INTRAVENOUS | Status: AC
  Administered 2022-09-18 (×2): 10 meq via INTRAVENOUS
  Filled 2022-09-18: qty 100

## 2022-09-18 MED ORDER — ALTEPLASE 1 MG/ML SYRINGE FOR VASCULAR PROCEDURE
INTRAMUSCULAR | Status: DC | PRN
  Administered 2022-09-18 (×2): 5 mg via INTRA_ARTERIAL

## 2022-09-18 MED ORDER — MIDAZOLAM HCL 2 MG/ML PO SYRP
8.0000 mg | ORAL_SOLUTION | Freq: Once | ORAL | Status: DC | PRN
  Filled 2022-09-18: qty 5

## 2022-09-18 MED ORDER — SODIUM CHLORIDE 0.9 % IV SOLN: INTRAVENOUS | Status: DC

## 2022-09-18 MED ORDER — DOCUSATE SODIUM 100 MG PO CAPS: 100.0000 mg | ORAL_CAPSULE | Freq: Two times a day (BID) | ORAL | Status: DC | PRN

## 2022-09-18 MED ORDER — IODIXANOL 320 MG/ML IV SOLN
INTRAVENOUS | Status: DC | PRN
  Administered 2022-09-18: 80 mL via INTRAVENOUS

## 2022-09-18 MED ORDER — NOREPINEPHRINE 4 MG/250ML-% IV SOLN
2.0000 ug/min | INTRAVENOUS | Status: DC
  Administered 2022-09-18: 5 ug/min via INTRAVENOUS
  Filled 2022-09-18 (×2): qty 250

## 2022-09-18 MED ORDER — CHLORHEXIDINE GLUCONATE CLOTH 2 % EX PADS
6.0000 | MEDICATED_PAD | Freq: Every day | CUTANEOUS | Status: DC
  Administered 2022-09-19 – 2022-09-21 (×2): 6 via TOPICAL

## 2022-09-18 MED ORDER — ONDANSETRON HCL 4 MG/2ML IJ SOLN: 4.0000 mg | Freq: Four times a day (QID) | INTRAMUSCULAR | Status: DC | PRN

## 2022-09-18 MED ORDER — KETOROLAC TROMETHAMINE 15 MG/ML IJ SOLN
15.0000 mg | Freq: Once | INTRAMUSCULAR | Status: AC
  Administered 2022-09-18: 15 mg via INTRAVENOUS
  Filled 2022-09-18: qty 1

## 2022-09-18 MED ORDER — FAMOTIDINE 20 MG PO TABS: 40.0000 mg | ORAL_TABLET | Freq: Once | ORAL | Status: DC | PRN

## 2022-09-18 MED ORDER — METHYLPREDNISOLONE SODIUM SUCC 125 MG IJ SOLR: 125.0000 mg | Freq: Once | INTRAMUSCULAR | Status: DC | PRN

## 2022-09-18 MED ORDER — HEPARIN (PORCINE) 25000 UT/250ML-% IV SOLN
1300.0000 [IU]/h | INTRAVENOUS | Status: DC
  Administered 2022-09-18: 1300 [IU]/h via INTRAVENOUS
  Filled 2022-09-18: qty 250

## 2022-09-18 SURGICAL SUPPLY — 21 items
CANISTER PENUMBRA ENGINE (MISCELLANEOUS) IMPLANT
CATH ANGIO 5F PIGTAIL 100CM (CATHETERS) IMPLANT
CATH INDIGO 12XTORQ 100 (CATHETERS) IMPLANT
CATH INDIGO SEP 12 (CATHETERS) IMPLANT
CLOSURE PERCLOSE PROSTYLE (VASCULAR PRODUCTS) IMPLANT
COVER PROBE ULTRASOUND 5X96 (MISCELLANEOUS) IMPLANT
DEVICE TORQUE (MISCELLANEOUS) IMPLANT
GLIDEWIRE ANGLED SS 035X260CM (WIRE) IMPLANT
GUIDEWIRE ANGLED .035X260CM (WIRE) IMPLANT
NDL ENTRY 21GA 7CM ECHOTIP (NEEDLE) IMPLANT
NEEDLE ENTRY 21GA 7CM ECHOTIP (NEEDLE) ×1 IMPLANT
PACK ANGIOGRAPHY (CUSTOM PROCEDURE TRAY) ×1 IMPLANT
SET INTRO CAPELLA COAXIAL (SET/KITS/TRAYS/PACK) IMPLANT
SHEATH BRITE TIP 6FRX11 (SHEATH) IMPLANT
SHEATH CHCK-FLO 14FR 13 (SHEATH) IMPLANT
SUT MNCRL AB 4-0 PS2 18 (SUTURE) IMPLANT
SUT SILK 0 FSL (SUTURE) IMPLANT
SYR MEDRAD MARK 7 150ML (SYRINGE) IMPLANT
TUBING CONTRAST HIGH PRESS 72 (TUBING) IMPLANT
WIRE AMPLATZ SSTIFF .035X260CM (WIRE) IMPLANT
WIRE GUIDERIGHT .035X150 (WIRE) IMPLANT

## 2022-09-18 NOTE — Progress Notes (Signed)
*  PRELIMINARY RESULTS* Echocardiogram 2D Echocardiogram has been performed.  Brandi Myers 09/18/2022, 2:21 PM

## 2022-09-18 NOTE — Inpatient Diabetes Management (Signed)
Inpatient Diabetes Program Recommendations  AACE/ADA: New Consensus Statement on Inpatient Glycemic Control (2015)  Target Ranges:  Prepandial:   less than 140 mg/dL      Peak postprandial:   less than 180 mg/dL (1-2 hours)      Critically ill patients:  140 - 180 mg/dL   Lab Results  Component Value Date   GLUCAP 261 (H) 09/18/2022   HGBA1C 5.5 01/15/2021    Review of Glycemic Control  Latest Reference Range & Units 09/18/22 12:22  Glucose-Capillary 70 - 99 mg/dL 829 (H)   Diabetes history: DM 2 Outpatient Diabetes medications:  Novolog 0-15 units q 4 hours Levemir 40 units daily Current orders for Inpatient glycemic control:  Novolog 0-15 units q 4 hours  Inpatient Diabetes Program Recommendations:    Consider adding Levemir 20 units daily (1/2 of home dose).   Thanks,  Beryl Meager, RN, BC-ADM Inpatient Diabetes Coordinator Pager [phone]  (8a-5p)

## 2022-09-18 NOTE — ED Provider Notes (Signed)
[facility] Provider Note    Event Date/Time   First MD Initiated Contact with Patient 09/18/22 0720     (approximate)   History   Chief Complaint: Shortness of Breath   HPI  Brandi Myers is a 37 y.o. 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  She reports she was in her usual state of health, and then started smoking cigarettes, afterwards started feeling somewhat anxious and more short of breath.  It felt like anxiety, so she decided to wait.  Then she decided that marijuana usually helps her feel calm so she smoked marijuana.  She still felt anxious, and came to the ED for evaluation.  On arrival to the treatment room, patient reports that her symptoms have resolved.  She is sitting upright, drinking fluids, reports that she feels normal.  Denies any chest pain at any point.  Cough was nonproductive.  No fever but endorses chills and fatigue.  No vomiting or diarrhea.  Normal oral intake.     Physical Exam   Triage Vital Signs: ED Triage Vitals  Enc Vitals Group     BP 09/18/22 0030 130/86     Pulse Rate 09/18/22 0030 (!) 109     Resp 09/18/22 0030 18     Temp 09/18/22 0030 98.1 F (36.7 C)     Temp Source 09/18/22 0030 Oral     SpO2 09/18/22 0030 95 %     Weight 09/18/22 0037 230 lb (104.3 kg)     Height 09/18/22 0037 5\' 5"  (1.651 m)     Head Circumference --      Peak Flow --      Pain Score 09/18/22 0037 7     Pain Loc --      Pain Edu? --      Excl. in GC? --     Most recent vital signs: Vitals:   09/18/22 0955 09/18/22 1000  BP: (!) 83/55 (!) 84/49  Pulse:    Resp: (!) 22 (!) 30  Temp:    SpO2: 95% 95%    General: Awake, no distress.  CV:  Good peripheral perfusion.  Tachycardia heart rate 105 Resp:  Normal effort.  Clear to auscultation bilaterally.  No wheezes or  crackles Abd:  No distention.  Soft nontender Other:  No lower extremity edema or calf tenderness.   ED Results / Procedures / Treatments   Labs (all labs ordered are listed, but only abnormal results are displayed) Labs Reviewed  BASIC METABOLIC PANEL - Abnormal; Notable for the following components:      Result Value   Potassium 3.4 (*)    Glucose, Bld 302 (*)    Calcium 8.5 (*)    All other components within normal limits  CBC - Abnormal; Notable for the following components:   Hemoglobin 8.4 (*)    HCT 31.9 (*)    MCV 67.7 (*)    MCH 17.8 (*)    MCHC 26.3 (*)    RDW 20.6 (*)    All other components within normal limits  D-DIMER, QUANTITATIVE - Abnormal; Notable for the following components:   D-Dimer, Quant 8.10 (*)    All other components within normal limits  IRON AND TIBC - Abnormal; Notable for the following components:   Iron 21 (*)    TIBC 515 (*)  Saturation Ratios 4 (*)    All other components within normal limits  FERRITIN - Abnormal; Notable for the following components:   Ferritin 10 (*)    All other components within normal limits  POC URINE PREG, ED - Normal  SARS CORONAVIRUS 2 BY RT PCR  PROTIME-INR  HEPARIN LEVEL (UNFRACTIONATED)  APTT  HEPARIN LEVEL (UNFRACTIONATED)  TROPONIN I (HIGH SENSITIVITY)  TROPONIN I (HIGH SENSITIVITY)  TROPONIN I (HIGH SENSITIVITY)     EKG Interpreted by me Sinus tachycardia rate 111.  Normal axis and intervals.  Normal QRS ST segments and T waves.  No ischemic changes.  No evidence of right heart strain   RADIOLOGY Chest x-ray interpreted by me, appears normal.  Radiology report reviewed   PROCEDURES:  .Critical Care  Performed by: Sharman Cheek, MD Authorized by: Sharman Cheek, MD   Critical care provider statement:    Critical care time (minutes):  35   Critical care time was exclusive of:  Separately billable procedures and treating other patients   Critical care was necessary to treat or  prevent imminent or life-threatening deterioration of the following conditions:  Sepsis and respiratory failure   Critical care was time spent personally by me on the following activities:  Development of treatment plan with patient or surrogate, discussions with consultants, evaluation of patient's response to treatment, examination of patient, obtaining history from patient or surrogate, ordering and performing treatments and interventions, ordering and review of laboratory studies, ordering and review of radiographic studies, pulse oximetry, re-evaluation of patient's condition and review of old charts   Care discussed with: admitting provider      MEDICATIONS ORDERED IN ED: Medications  heparin ADULT infusion 100 units/mL (25000 units/231mL) (1,300 Units/hr Intravenous New Bag/Given 09/18/22 1008)  0.9 %  sodium chloride infusion (has no administration in time range)  norepinephrine (LEVOPHED) 4mg  in (0.016 mg/mL) premix infusion (has no administration in time range)  sodium chloride 0.9 % bolus 1,000 mL (0 mLs Intravenous Stopped 09/18/22 0920)  ketorolac (TORADOL) 15 MG/ML injection 15 mg (15 mg Intravenous Given 09/18/22 0806)  benzonatate (TESSALON) capsule 100 mg (100 mg Oral Given 09/18/22 0827)  iohexol (OMNIPAQUE) 350 MG/ML injection 75 mL (75 mLs Intravenous Contrast Given 09/18/22 0911)  heparin bolus via infusion 5,000 Units (5,000 Units Intravenous Bolus from Bag 09/18/22 1009)  sodium chloride 0.9 % bolus 1,000 mL (1,000 mLs Intravenous New Bag/Given 09/18/22 1009)     IMPRESSION / MDM / ASSESSMENT AND PLAN / ED COURSE  I reviewed the triage vital signs and the nursing notes.  DDx: Anxiety, dehydration, electrolyte abnormality, non-STEMI, pulmonary embolism, COVID, anemia, tobacco side effects  Patient's presentation is most consistent with acute presentation with potential threat to life or bodily function.  Patient presents with an episode of shortness of breath and anxiousness  after smoking, now resolved.  She does have persistent tachycardia.  Initial labs were all normal except for CBC showing evidence of iron deficiency anemia.  Troponin normal, pregnancy negative.  D-dimer obtained, markedly elevated.  Will obtain CT angiogram of the chest.   Clinical Course as of 09/18/22 1039  Thu Sep 18, 2022  0945 Basic metabolic panel(!) [PS]  [phone] CTA d/w radiology. Pt informed. Starting heparin, second IVF bolus [PS]    Clinical Course User Index [PS] Sharman Cheek, MD    ----------------------------------------- 10:39 AM on 09/18/2022 ----------------------------------------- Blood pressure downtrending, becoming hypotensive.  Will start Levophed.  ICU contacted for eval. vascular surgery has evaluated the patient, request  lower extremity ultrasounds due to asymmetric edema and possible need for IVC filter.   FINAL CLINICAL IMPRESSION(S) / ED DIAGNOSES   Final diagnoses:  Acute saddle pulmonary embolism with acute cor pulmonale (HCC)  Iron deficiency anemia, unspecified iron deficiency anemia type     Rx / DC Orders   ED Discharge Orders     None        Note:  This document was prepared using Dragon voice recognition software and may include unintentional dictation errors.   Sharman Cheek, MD 09/18/22 1039

## 2022-09-18 NOTE — Consult Note (Signed)
ANTICOAGULATION CONSULT NOTE - Initial Consult  Pharmacy Consult for heparin infusion Indication: pulmonary embolus  Allergies  Allergen Reactions   Norco [Hydrocodone-Acetaminophen] Itching, Nausea And Vomiting and Swelling    Patient Measurements: Height: 5\' 5"  (165.1 cm) Weight: 104.3 kg (230 lb) IBW/kg (Calculated) : 57 Heparin Dosing Weight: 81.2 kg  Vital Signs: Temp: 98.1 F (36.7 C) (06/06 0030) Temp Source: Oral (06/06 0030) BP: 90/62 (06/06 0804) Pulse Rate: 113 (06/06 0734)  Labs: Recent Labs    09/18/22 0052 09/18/22 0317 09/18/22 0805  HGB 8.4*  --   --   HCT 31.9*  --   --   PLT 232  --   --   CREATININE 0.78  --   --   TROPONINIHS 10 10 15     Estimated Creatinine Clearance: 115.4 mL/min (by C-G formula based on SCr of 0.78 mg/dL).   Medical History: Past Medical History:  Diagnosis Date   Asthma    Diabetes mellitus without complication (HCC)    Hypertension     Medications:  No current anticoagulation per pharmacist review.  Assessment: 37 yo female presented to the ED with chief complaint of shortness of breath and cough. D-dimer found to be elevated and chest CT showed saddle pulmonary embolus.  Pharmacy consulted to start heparin infusion.  Goal of Therapy:  Heparin level 0.3-0.7 units/ml Monitor platelets by anticoagulation protocol: Yes   Plan:  Give 5000 units bolus x 1 Start heparin infusion at 1300 units/hr Check anti-Xa level in 6 hours and daily while on heparin Continue to monitor H&H and platelets  Barrie Folk, PharmD 09/18/2022,9:59 AM

## 2022-09-18 NOTE — Progress Notes (Signed)
Assessed BUE for USG PIV placement.  No suitable veins present.  All veins measure occupancy of 22G cath at 70% or greater with the tourniquet on.  Dr Belia Heman at bedside notified.  LUE contracted from previous CVA.

## 2022-09-18 NOTE — ED Triage Notes (Signed)
Pt arrived via [facility] from home with c/o SOB and coughing x1 day.

## 2022-09-18 NOTE — H&P (View-Only) (Signed)
Hospital Consult    Reason for Consult:  Saddle Pulmonary Embolism  Requesting Physician:  Dr Sharman Cheek MD MRN #:  [id number]  History of Present Illness: This is a 37 y.o. female Brandi Myers is a 37 y.o. 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  She reports she was in her usual state of health, and then started smoking cigarettes, afterwards started feeling somewhat anxious and more short of breath.  It felt like anxiety, so she decided to wait.  Then she decided that marijuana usually helps her feel calm so she smoked marijuana.  She still felt anxious, and came to the ED for evaluation.  On arrival to the treatment room, patient reports that her symptoms have resolved.  She is sitting upright, drinking fluids, reports that she feels normal.  Denies any chest pain at any point.  Cough was nonproductive.  No fever but endorses chills and fatigue.  No vomiting or diarrhea.  Normal oral intake.  Exam this morning patient is resting comfortably in a stretcher in the emergency department.  She is currently on 4 L nasal cannula oxygen with oxygen saturations of 94 to 95%. She does still endorse some shortness of breath with the left lower extremity swelling which she states started 3 weeks ago after she was discharged from the hospital. Patient states she is currently not taking any anticoagulation, all she takes is an aspirin daily.  Currently she denies any chest pain, nausea vomiting diarrhea or fever.  She does endorse cough which she has had for the last 3 weeks.  Past Medical History:  Diagnosis Date   Asthma    Diabetes mellitus without complication (HCC)    Hypertension     Past Surgical History:  Procedure Laterality Date   BUBBLE STUDY  07/19/2020   Procedure: BUBBLE STUDY;  Surgeon: Little Ishikawa, MD;   Location: [facility] ENDOSCOPY;  Service: Cardiovascular;;   CESAREAN SECTION     CHOLECYSTECTOMY     CRANIOTOMY Right 07/06/2020   Procedure: RIGHT CRANIECTOMY WITH PLACEMENT OF BONE FLAP IN ABDOMEN;  Surgeon: Maeola Harman, MD;  Location: [facility] OR;  Service: Neurosurgery;  Laterality: Right;   CRANIOTOMY N/A 01/18/2021   Procedure: Replacement of right craniectomy skull flap from right abdomen;  Surgeon: Maeola Harman, MD;  Location: [facility] OR;  Service: Neurosurgery;  Laterality: N/A;   ESOPHAGOGASTRODUODENOSCOPY (EGD) WITH PROPOFOL N/A 07/26/2020   Procedure: ESOPHAGOGASTRODUODENOSCOPY (EGD) WITH PROPOFOL;  Surgeon: Violeta Gelinas, MD;  Location: [facility] ENDOSCOPY;  Service: General;  Laterality: N/A;   IR ANGIO INTRA EXTRACRAN SEL COM CAROTID INNOMINATE UNI L MOD SED  07/04/2020   IR ANGIO VERTEBRAL SEL VERTEBRAL UNI R MOD SED  07/04/2020   IR CT HEAD LTD  07/04/2020   IR CT HEAD LTD  07/04/2020   IR PERCUTANEOUS ART THROMBECTOMY/INFUSION INTRACRANIAL INC DIAG ANGIO  07/04/2020   PEG PLACEMENT N/A 07/26/2020   Procedure: PERCUTANEOUS ENDOSCOPIC GASTROSTOMY (PEG) PLACEMENT;  Surgeon: Violeta Gelinas, MD;  Location: [facility] ENDOSCOPY;  Service: General;  Laterality: N/A;   PERIPHERAL VASCULAR BALLOON ANGIOPLASTY  09/13/2020   Procedure: PERIPHERAL VASCULAR BALLOON ANGIOPLASTY;  Surgeon: Cephus Shelling, MD;  Location: [facility] INVASIVE CV LAB;  Service: Cardiovascular;;  Radial    RADIOLOGY WITH ANESTHESIA N/A 07/04/2020   Procedure: IR WITH ANESTHESIA;  Surgeon: Radiologist, Medication, MD;  Location: [facility] OR;  Service: Radiology;  Laterality: N/A;   TEE WITHOUT CARDIOVERSION N/A 07/19/2020   Procedure: TRANSESOPHAGEAL ECHOCARDIOGRAM (TEE);  Surgeon: Little Ishikawa, MD;  Location: [facility] ENDOSCOPY;  Service: Cardiovascular;  Laterality: N/A;   THROMBECTOMY BRACHIAL ARTERY Right 07/05/2020   Procedure: RIGHT UPPER EXTREMITY THROMBECTOMY;  Surgeon: Cephus Shelling, MD;  Location: [facility] OR;  Service: Vascular;  Laterality:  Right;   UPPER EXTREMITY ANGIOGRAPHY N/A 09/13/2020   Procedure: UPPER EXTREMITY ANGIOGRAPHY - Right;  Surgeon: Cephus Shelling, MD;  Location: [facility] INVASIVE CV LAB;  Service: Cardiovascular;  Laterality: N/A;    Allergies  Allergen Reactions   Norco [Hydrocodone-Acetaminophen] Itching, Nausea And Vomiting and Swelling    Prior to Admission medications   Medication Sig Start Date End Date Taking? Authorizing Provider  acetaminophen (TYLENOL) 325 MG tablet Take 2 tablets (650 mg total) by mouth every 4 (four) hours as needed for mild pain (or temp > 37.5 C (99.5 F)). 08/03/20   Russella Dar, NP  albuterol (VENTOLIN HFA) 108 (90 Base) MCG/ACT inhaler Inhale 1-2 puffs into the lungs every 6 (six) hours as needed for wheezing or shortness of breath.    [provider]  amoxicillin (AMOXIL) 500 MG capsule Take 1 capsule (500 mg total) by mouth 3 (three) times daily. Patient not taking: Reported on 07/12/2022 12/08/21   Menshew, Charlesetta Ivory, PA-C  aspirin EC 81 MG tablet Take 81 mg by mouth daily. Swallow whole.    [provider]  atorvastatin (LIPITOR) 40 MG tablet Place 1 tablet (40 mg total) into feeding tube daily at 6 PM. Patient taking differently: Take 40 mg by mouth daily. 08/03/20   Russella Dar, NP  BENZAMYCIN gel Apply 1 application topically in the morning and at bedtime. Patient not taking: Reported on 07/12/2022 12/26/20   [provider]  cyclobenzaprine (FLEXERIL) 10 MG tablet Take 10 mg by mouth every 8 (eight) hours as needed for muscle spasms. 06/14/22   [provider]  gabapentin (NEURONTIN) 300 MG capsule Take 300 mg by mouth 2 (two) times daily.    [provider]  insulin aspart (NOVOLOG) 100 UNIT/ML injection Inject 0-15 Units into the skin every 4 (four) hours. Correction coverage: Moderate (average weight, post-op)  CBG < 70: Implement Hypoglycemia Standing Orders and refer to Hypoglycemia Standing Orders sidebar  report  CBG 70 - 120: 0 units  CBG 121 - 150: 2 units  CBG 151 - 200: 3 units  CBG 201 - 250: 5 units  CBG 251 - 300: 8 units  CBG 301 - 350: 11 units  CBG 351 - 400: 15 units  CBG > 400 call MD and obtain STAT lab verification Patient not taking: No sig reported 08/03/20   Russella Dar, NP  insulin aspart (NOVOLOG) 100 UNIT/ML injection Inject 6 Units into the skin every 4 (four) hours. Patient not taking: No sig reported 08/03/20   Russella Dar, NP  insulin detemir (LEVEMIR) 100 UNIT/ML injection Inject 0.35 mLs (35 Units total) into the skin 2 (two) times daily. Patient taking differently: Inject 40 Units into the skin daily. 08/03/20   Russella Dar, NP  melatonin 3 MG TABS tablet Place 1 tablet (3 mg total) into feeding tube at bedtime. Patient taking differently: Take 3 mg by mouth at bedtime. 08/03/20   Russella Dar, NP  metoprolol tartrate (LOPRESSOR) 50 MG tablet Place 1 tablet (50 mg total) into feeding tube 2 (two) times daily. Patient taking differently: Take 50 mg by  mouth 2 (two) times daily. 08/03/20   Russella Dar, NP  Multiple Vitamin (MULTIVITAMIN WITH MINERALS) TABS tablet Place 1 tablet into feeding tube daily. Patient taking differently: Take 1 tablet by mouth daily. 08/04/20   Russella Dar, NP  RETIN-A 0.025 % cream Apply 1 application topically at bedtime. 12/20/20   [provider]  risperiDONE (RISPERDAL) 0.5 MG tablet Take 1 tablet (0.5 mg total) by mouth 2 (two) times daily as needed (agitation). 07/11/22 08/10/22  Charm Rings, NP  sertraline (ZOLOFT) 25 MG tablet Place 1 tablet (25 mg total) into feeding tube daily. Patient taking differently: Take 25 mg by mouth daily. 08/04/20   Russella Dar, NP  traMADol (ULTRAM) 50 MG tablet Take 1 tablet (50 mg total) by mouth 3 (three) times daily as needed. Patient not taking: Reported on 07/12/2022 12/08/21   Menshew, Charlesetta Ivory, PA-C  TRULICITY 0.75 MG/0.5ML SOPN Inject 0.75 mg into the  skin every Saturday. Patient not taking: Reported on 07/12/2022 12/25/20   [provider]  valACYclovir (VALTREX) 1000 MG tablet Take 1,000 mg by mouth 2 (two) times daily. Patient not taking: Reported on 07/12/2022 05/27/22   [provider]  valproic acid (DEPAKENE) 250 MG capsule Take 250 mg by mouth 2 (two) times daily. Patient not taking: Reported on 07/12/2022    [provider]    Social History   Socioeconomic History   Marital status: Single    Spouse name: Not on file   Number of children: Not on file   Years of education: Not on file   Highest education level: Not on file  Occupational History   Occupation: home maker  Tobacco Use   Smoking status: Every Day    Packs/day: .25    Types: Cigarettes   Smokeless tobacco: Never  Vaping Use   Vaping Use: Former  Substance and Sexual Activity   Alcohol use: Not Currently   Drug use: No   Sexual activity: Not on file    Comment: tubal ligation  Other Topics Concern   Not on file  Social History Narrative   Not on file   Social Determinants of Health   Financial Resource Strain: Not on file  Food Insecurity: Not on file  Transportation Needs: Not on file  Physical Activity: Not on file  Stress: Not on file  Social Connections: Not on file  Intimate Partner Violence: Not on file     Family History  Problem Relation Age of Onset   Diabetes Maternal Grandmother     ROS: Otherwise negative unless mentioned in HPI  Physical Examination  Vitals:   09/18/22 0955 09/18/22 1000  BP: (!) 83/55 (!) 84/49  Pulse:    Resp: (!) 22 (!) 30  Temp:    SpO2: 95% 95%   Body mass index is 38.27 kg/m.  General:  WDWN in NAD Gait: Not observed HENT: WNL, normocephalic Pulmonary: normal non-labored breathing, without Rales, rhonchi,  wheezing Cardiac: regular, without  Murmurs, rubs or gallops; without carotid bruits Abdomen: Positive Bowel Sounds,  soft, NT/ND, no masses Skin: without  rashes Vascular Exam/Pulses: Palpable Pulses throughout.  Extremities: without ischemic changes, without Gangrene , without cellulitis; without open wounds;  Musculoskeletal: no muscle wasting or atrophy with exception to left upper extremity post CVA. Left Lower extremity Swelling +2 X 3 weeks.   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 Speech is fluent/normal. Normally has short term memory issues.  Psychiatric:  The pt has Normal affect. Lymph:  Unremarkable  CBC    Component Value Date/Time   WBC 8.9 09/18/2022 0052   RBC 4.71 09/18/2022 0052   HGB 8.4 (L) 09/18/2022 0052   HGB 11.4 (L) 10/22/2012 1800   HCT 31.9 (L) 09/18/2022 0052   HCT 31.4 (L) 10/23/2012 0518   PLT 232 09/18/2022 0052   PLT 191 10/22/2012 1800   MCV 67.7 (L) 09/18/2022 0052   MCV 87 10/22/2012 1800   MCH 17.8 (L) 09/18/2022 0052   MCHC 26.3 (L) 09/18/2022 0052   RDW 20.6 (H) 09/18/2022 0052   RDW 14.3 10/22/2012 1800   LYMPHSABS 3.3 07/22/2022 0043   LYMPHSABS 2.4 10/22/2012 1800   MONOABS 0.7 07/22/2022 0043   MONOABS 1.0 (H) 10/22/2012 1800   EOSABS 0.2 07/22/2022 0043   EOSABS 0.1 10/22/2012 1800   BASOSABS 0.1 07/22/2022 0043   BASOSABS 0.1 10/22/2012 1800    BMET    Component Value Date/Time   NA 136 09/18/2022 0052   K 3.4 (L) 09/18/2022 0052   CL 100 09/18/2022 0052   CO2 25 09/18/2022 0052   GLUCOSE 302 (H) 09/18/2022 0052   BUN 9 09/18/2022 0052   CREATININE 0.78 09/18/2022 0052   CALCIUM 8.5 (L) 09/18/2022 0052   GFRNONAA >60 09/18/2022 0052   GFRAA >60 01/04/2018 2056    COAGS: Lab Results  Component Value Date   INR 1.2 07/04/2020     Non-Invasive Vascular Imaging:   EXAM:09/18/2022 CT ANGIOGRAPHY CHEST WITH CONTRAST   TECHNIQUE: Multidetector CT imaging of the chest was performed using the standard protocol during bolus administration of intravenous contrast. Multiplanar CT image reconstructions and MIPs were obtained to evaluate the  vascular anatomy.   RADIATION DOSE REDUCTION: This exam was performed according to the departmental dose-optimization program which includes automated exposure control, adjustment of the mA and/or kV according to patient size and/or use of iterative reconstruction technique.   CONTRAST:  75mL OMNIPAQUE IOHEXOL 350 MG/ML SOLN   COMPARISON:  Chest XR, concurrent.  CTA chest, 07/28/2020.   FINDINGS: Cardiovascular:   Satisfactory opacification of the pulmonary arteries to the segmental level.   Saddle embolus with extension to the upper and lower segmental pulmonary arteries, bilaterally.   Leftward interventricular septum bowing with RV/LV ratio 1.75. Trace pericardial effusion.   Mediastinum/Nodes: No enlarged mediastinal, hilar, or axillary lymph nodes. Thyroid gland, trachea, and esophagus demonstrate no significant findings.   Lungs/Pleura: Lungs are clear without focal consolidation, mass or suspicious pulmonary nodule. No pleural effusion or pneumothorax.   Upper Abdomen: No acute abnormality.  Cholecystectomy clips.   Musculoskeletal: No acute chest wall abnormality. No acute or significant osseous findings.   Review of the MIP images confirms the above findings.   IMPRESSION: Examination is POSITIVE for saddle pulmonary embolus, with additional BILATERAL segmental pulmonary emboli.   CT evidence of right heart strain (RV/LV Ratio = 1.75) consistent with at least submassive (intermediate risk) PE.   The presence of right heart strain has been associated with an increased risk of morbidity and mortality.  Statin:  Yes.   Beta Blocker:  Yes.   Aspirin:  Yes.   ACEI:  No. ARB:  No. CCB use:  No Other antiplatelets/anticoagulants:  No.    ASSESSMENT/PLAN: This is a 37 y.o. 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  Presents to [facility] emergency department complaining  of shortness of breath and cough  that started last night around midnight.  Patient states the last time she took her Eliquis was back in December after they took her off of it for tooth extraction.  Patient is also noted to have left lower extremity swelling which she states started 3 weeks ago at the time she was discharged from the hospital.  PLAN: Vascular surgery plans on taking the patient to the vascular lab for a pulmonary thrombectomy with possible IVC filter placement and possible intervention this afternoon on 09/18/2022.  I discussed in detail with the patient the procedure, benefits, risks, and complications.  She verbalized her understanding.  I answered all her questions.  Patient endorses she has been n.p.o. since midnight last night.  No signs all remained stable.  Patient is on a heparin infusion.   -I discussed the plan in detail with Dr. Levora Dredge MD and he agrees with the plan.   Marcie Bal [facility] 09/18/2022 10:44 AM

## 2022-09-18 NOTE — Consult Note (Signed)
Hospital Consult    Reason for Consult:  Saddle Pulmonary Embolism  Requesting Physician:  Dr Phillip Stafford MD MRN #:  [id number]  History of Present Illness: This is a 37 y.o. female Brandi Myers is a 36 y.o. 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  She reports she was in her usual state of health, and then started smoking cigarettes, afterwards started feeling somewhat anxious and more short of breath.  It felt like anxiety, so she decided to wait.  Then she decided that marijuana usually helps her feel calm so she smoked marijuana.  She still felt anxious, and came to the ED for evaluation.  On arrival to the treatment room, patient reports that her symptoms have resolved.  She is sitting upright, drinking fluids, reports that she feels normal.  Denies any chest pain at any point.  Cough was nonproductive.  No fever but endorses chills and fatigue.  No vomiting or diarrhea.  Normal oral intake.  Exam this morning patient is resting comfortably in a stretcher in the emergency department.  She is currently on 4 L nasal cannula oxygen with oxygen saturations of 94 to 95%. She does still endorse some shortness of breath with the left lower extremity swelling which she states started 3 weeks ago after she was discharged from the hospital. Patient states she is currently not taking any anticoagulation, all she takes is an aspirin daily.  Currently she denies any chest pain, nausea vomiting diarrhea or fever.  She does endorse cough which she has had for the last 3 weeks.  Past Medical History:  Diagnosis Date   Asthma    Diabetes mellitus without complication (HCC)    Hypertension     Past Surgical History:  Procedure Laterality Date   BUBBLE STUDY  07/19/2020   Procedure: BUBBLE STUDY;  Surgeon: Schumann, Christopher L, MD;   Location: [facility];  Service: Cardiovascular;;   CESAREAN SECTION     CHOLECYSTECTOMY     CRANIOTOMY Right 07/06/2020   Procedure: RIGHT CRANIECTOMY WITH PLACEMENT OF BONE FLAP IN ABDOMEN;  Surgeon: Stern, Joseph, MD;  Location: [facility];  Service: Neurosurgery;  Laterality: Right;   CRANIOTOMY N/A 01/18/2021   Procedure: Replacement of right craniectomy skull flap from right abdomen;  Surgeon: Stern, Joseph, MD;  Location: [facility];  Service: Neurosurgery;  Laterality: N/A;   ESOPHAGOGASTRODUODENOSCOPY (EGD) WITH PROPOFOL N/A 07/26/2020   Procedure: ESOPHAGOGASTRODUODENOSCOPY (EGD) WITH PROPOFOL;  Surgeon: Thompson, Burke, MD;  Location: [facility];  Service: General;  Laterality: N/A;   IR ANGIO INTRA EXTRACRAN SEL COM CAROTID INNOMINATE UNI L MOD SED  07/04/2020   IR ANGIO VERTEBRAL SEL VERTEBRAL UNI R MOD SED  07/04/2020   IR CT HEAD LTD  07/04/2020   IR CT HEAD LTD  07/04/2020   IR PERCUTANEOUS ART THROMBECTOMY/INFUSION INTRACRANIAL INC DIAG ANGIO  07/04/2020   PEG PLACEMENT N/A 07/26/2020   Procedure: PERCUTANEOUS ENDOSCOPIC GASTROSTOMY (PEG) PLACEMENT;  Surgeon: Thompson, Burke, MD;  Location: [facility];  Service: General;  Laterality: N/A;   PERIPHERAL VASCULAR BALLOON ANGIOPLASTY  09/13/2020   Procedure: PERIPHERAL VASCULAR BALLOON ANGIOPLASTY;  Surgeon: Clark, Christopher J, MD;  [facility]: [facility];  Service: Cardiovascular;;  Radial    RADIOLOGY WITH ANESTHESIA N/A 07/04/2020   Procedure: IR WITH ANESTHESIA;  Surgeon: Radiologist, Medication, MD;  Location: [facility];  Service: Radiology;    Laterality: N/A;   TEE WITHOUT CARDIOVERSION N/A 07/19/2020   Procedure: TRANSESOPHAGEAL ECHOCARDIOGRAM (TEE);  Surgeon: Schumann, Christopher L, MD;  Location: [facility];  Service: Cardiovascular;  Laterality: N/A;   THROMBECTOMY BRACHIAL ARTERY Right 07/05/2020   Procedure: RIGHT UPPER EXTREMITY THROMBECTOMY;  Surgeon: Clark, Christopher J, MD;  Location: [facility];  Service: Vascular;  Laterality:  Right;   UPPER EXTREMITY ANGIOGRAPHY N/A 09/13/2020   Procedure: UPPER EXTREMITY ANGIOGRAPHY - Right;  Surgeon: Clark, Christopher J, MD;  [facility];  Service: Cardiovascular;  Laterality: N/A;    Allergies  Allergen Reactions   Norco [Hydrocodone-Acetaminophen] Itching, Nausea And Vomiting and Swelling    Prior to Admission medications   Medication Sig Start Date End Date Taking? Authorizing Provider  acetaminophen (TYLENOL) 325 MG tablet Take 2 tablets (650 mg total) by mouth every 4 (four) hours as needed for mild pain (or temp > 37.5 C (99.5 F)). 08/03/20   Ellis, Allison L, NP  albuterol (VENTOLIN HFA) 108 (90 Base) MCG/ACT inhaler Inhale 1-2 puffs into the lungs every 6 (six) hours as needed for wheezing or shortness of breath.    [provider]  amoxicillin (AMOXIL) 500 MG capsule Take 1 capsule (500 mg total) by mouth 3 (three) times daily. Patient not taking: Reported on 07/12/2022 12/08/21   Menshew, Jenise V Bacon, PA-C  aspirin EC 81 MG tablet Take 81 mg by mouth daily. Swallow whole.    [provider]  atorvastatin (LIPITOR) 40 MG tablet Place 1 tablet (40 mg total) into feeding tube daily at 6 PM. Patient taking differently: Take 40 mg by mouth daily. 08/03/20   Ellis, Allison L, NP  BENZAMYCIN gel Apply 1 application topically in the morning and at bedtime. Patient not taking: Reported on 07/12/2022 12/26/20   [provider]  cyclobenzaprine (FLEXERIL) 10 MG tablet Take 10 mg by mouth every 8 (eight) hours as needed for muscle spasms. 06/14/22   [provider]  gabapentin (NEURONTIN) 300 MG capsule Take 300 mg by mouth 2 (two) times daily.    [provider]  insulin aspart (NOVOLOG) 100 UNIT/ML injection Inject 0-15 Units into the skin every 4 (four) hours. Correction coverage: Moderate (average weight, post-op)  CBG < 70: Implement Hypoglycemia Standing Orders and refer to Hypoglycemia Standing Orders sidebar  report  CBG 70 - 120: 0 units  CBG 121 - 150: 2 units  CBG 151 - 200: 3 units  CBG 201 - 250: 5 units  CBG 251 - 300: 8 units  CBG 301 - 350: 11 units  CBG 351 - 400: 15 units  CBG > 400 call MD and obtain STAT lab verification Patient not taking: No sig reported 08/03/20   Ellis, Allison L, NP  insulin aspart (NOVOLOG) 100 UNIT/ML injection Inject 6 Units into the skin every 4 (four) hours. Patient not taking: No sig reported 08/03/20   Ellis, Allison L, NP  insulin detemir (LEVEMIR) 100 UNIT/ML injection Inject 0.35 mLs (35 Units total) into the skin 2 (two) times daily. Patient taking differently: Inject 40 Units into the skin daily. 08/03/20   Ellis, Allison L, NP  melatonin 3 MG TABS tablet Place 1 tablet (3 mg total) into feeding tube at bedtime. Patient taking differently: Take 3 mg by mouth at bedtime. 08/03/20   Ellis, Allison L, NP  metoprolol tartrate (LOPRESSOR) 50 MG tablet Place 1 tablet (50 mg total) into feeding tube 2 (two) times daily. Patient taking differently: Take 50 mg by   mouth 2 (two) times daily. 08/03/20   Ellis, Allison L, NP  Multiple Vitamin (MULTIVITAMIN WITH MINERALS) TABS tablet Place 1 tablet into feeding tube daily. Patient taking differently: Take 1 tablet by mouth daily. 08/04/20   Ellis, Allison L, NP  RETIN-A 0.025 % cream Apply 1 application topically at bedtime. 12/20/20   [provider]  risperiDONE (RISPERDAL) 0.5 MG tablet Take 1 tablet (0.5 mg total) by mouth 2 (two) times daily as needed (agitation). 07/11/22 08/10/22  Lord, Jamison Y, NP  sertraline (ZOLOFT) 25 MG tablet Place 1 tablet (25 mg total) into feeding tube daily. Patient taking differently: Take 25 mg by mouth daily. 08/04/20   Ellis, Allison L, NP  traMADol (ULTRAM) 50 MG tablet Take 1 tablet (50 mg total) by mouth 3 (three) times daily as needed. Patient not taking: Reported on 07/12/2022 12/08/21   Menshew, Jenise V Bacon, PA-C  TRULICITY 0.75 MG/0.5ML SOPN Inject 0.75 mg into the  skin every Saturday. Patient not taking: Reported on 07/12/2022 12/25/20   [provider]  valACYclovir (VALTREX) 1000 MG tablet Take 1,000 mg by mouth 2 (two) times daily. Patient not taking: Reported on 07/12/2022 05/27/22   [provider]  valproic acid (DEPAKENE) 250 MG capsule Take 250 mg by mouth 2 (two) times daily. Patient not taking: Reported on 07/12/2022    [provider]    Social History   Socioeconomic History   Marital status: Single    Spouse name: Not on file   Number of children: Not on file   Years of education: Not on file   Highest education level: Not on file  Occupational History   Occupation: home maker  Tobacco Use   Smoking status: Every Day    Packs/day: .25    Types: Cigarettes   Smokeless tobacco: Never  Vaping Use   Vaping Use: Former  Substance and Sexual Activity   Alcohol use: Not Currently   Drug use: No   Sexual activity: Not on file    Comment: tubal ligation  Other Topics Concern   Not on file  Social History Narrative   Not on file   Social Determinants of Health   Financial Resource Strain: Not on file  Food Insecurity: Not on file  Transportation Needs: Not on file  Physical Activity: Not on file  Stress: Not on file  Social Connections: Not on file  Intimate Partner Violence: Not on file     Family History  Problem Relation Age of Onset   Diabetes Maternal Grandmother     ROS: Otherwise negative unless mentioned in HPI  Physical Examination  Vitals:   09/18/22 0955 09/18/22 1000  BP: (!) 83/55 (!) 84/49  Pulse:    Resp: (!) 22 (!) 30  Temp:    SpO2: 95% 95%   Body mass index is 38.27 kg/m.  General:  WDWN in NAD Gait: Not observed HENT: WNL, normocephalic Pulmonary: normal non-labored breathing, without Rales, rhonchi,  wheezing Cardiac: regular, without  Murmurs, rubs or gallops; without carotid bruits Abdomen: Positive Bowel Sounds,  soft, NT/ND, no masses Skin: without  rashes Vascular Exam/Pulses: Palpable Pulses throughout.  Extremities: without ischemic changes, without Gangrene , without cellulitis; without open wounds;  Musculoskeletal: no muscle wasting or atrophy with exception to left upper extremity post CVA. Left Lower extremity Swelling +2 X 3 weeks.   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 Speech is fluent/normal. Normally has short term memory issues.    Psychiatric:  The pt has Normal affect. Lymph:  Unremarkable  CBC    Component Value Date/Time   WBC 8.9 09/18/2022 0052   RBC 4.71 09/18/2022 0052   HGB 8.4 (L) 09/18/2022 0052   HGB 11.4 (L) 10/22/2012 1800   HCT 31.9 (L) 09/18/2022 0052   HCT 31.4 (L) 10/23/2012 0518   PLT 232 09/18/2022 0052   PLT 191 10/22/2012 1800   MCV 67.7 (L) 09/18/2022 0052   MCV 87 10/22/2012 1800   MCH 17.8 (L) 09/18/2022 0052   MCHC 26.3 (L) 09/18/2022 0052   RDW 20.6 (H) 09/18/2022 0052   RDW 14.3 10/22/2012 1800   LYMPHSABS 3.3 07/22/2022 0043   LYMPHSABS 2.4 10/22/2012 1800   MONOABS 0.7 07/22/2022 0043   MONOABS 1.0 (H) 10/22/2012 1800   EOSABS 0.2 07/22/2022 0043   EOSABS 0.1 10/22/2012 1800   BASOSABS 0.1 07/22/2022 0043   BASOSABS 0.1 10/22/2012 1800    BMET    Component Value Date/Time   NA 136 09/18/2022 0052   K 3.4 (L) 09/18/2022 0052   CL 100 09/18/2022 0052   CO2 25 09/18/2022 0052   GLUCOSE 302 (H) 09/18/2022 0052   BUN 9 09/18/2022 0052   CREATININE 0.78 09/18/2022 0052   CALCIUM 8.5 (L) 09/18/2022 0052   GFRNONAA >60 09/18/2022 0052   GFRAA >60 01/04/2018 2056    COAGS: Lab Results  Component Value Date   INR 1.2 07/04/2020     Non-Invasive Vascular Imaging:   EXAM:09/18/2022 CT ANGIOGRAPHY CHEST WITH CONTRAST   TECHNIQUE: Multidetector CT imaging of the chest was performed using the standard protocol during bolus administration of intravenous contrast. Multiplanar CT image reconstructions and MIPs were obtained to evaluate the  vascular anatomy.   RADIATION DOSE REDUCTION: This exam was performed according to the departmental dose-optimization program which includes automated exposure control, adjustment of the mA and/or kV according to patient size and/or use of iterative reconstruction technique.   CONTRAST:  75mL OMNIPAQUE IOHEXOL 350 MG/ML SOLN   COMPARISON:  Chest XR, concurrent.  CTA chest, 07/28/2020.   FINDINGS: Cardiovascular:   Satisfactory opacification of the pulmonary arteries to the segmental level.   Saddle embolus with extension to the upper and lower segmental pulmonary arteries, bilaterally.   Leftward interventricular septum bowing with RV/LV ratio 1.75. Trace pericardial effusion.   Mediastinum/Nodes: No enlarged mediastinal, hilar, or axillary lymph nodes. Thyroid gland, trachea, and esophagus demonstrate no significant findings.   Lungs/Pleura: Lungs are clear without focal consolidation, mass or suspicious pulmonary nodule. No pleural effusion or pneumothorax.   Upper Abdomen: No acute abnormality.  Cholecystectomy clips.   Musculoskeletal: No acute chest wall abnormality. No acute or significant osseous findings.   Review of the MIP images confirms the above findings.   IMPRESSION: Examination is POSITIVE for saddle pulmonary embolus, with additional BILATERAL segmental pulmonary emboli.   CT evidence of right heart strain (RV/LV Ratio = 1.75) consistent with at least submassive (intermediate risk) PE.   The presence of right heart strain has been associated with an increased risk of morbidity and mortality.  Statin:  Yes.   Beta Blocker:  Yes.   Aspirin:  Yes.   ACEI:  No. ARB:  No. CCB use:  No Other antiplatelets/anticoagulants:  No.    ASSESSMENT/PLAN: This is a 37 y.o. 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  Presents to [facility]'s emergency department complaining  of shortness of breath and cough   that started last night around midnight.  Patient states the last time she took her Eliquis was back in December after they took her off of it for tooth extraction.  Patient is also noted to have left lower extremity swelling which she states started 3 weeks ago at the time she was discharged from the hospital.  PLAN: Vascular surgery plans on taking the patient to the vascular lab for a pulmonary thrombectomy with possible IVC filter placement and possible intervention this afternoon on 09/18/2022.  I discussed in detail with the patient the procedure, benefits, risks, and complications.  She verbalized her understanding.  I answered all her questions.  Patient endorses she has been n.p.o. since midnight last night.  No signs all remained stable.  Patient is on a heparin infusion.   -I discussed the plan in detail with Dr. Gregory Schnier MD and he agrees with the plan.   Graycen Sadlon R Leya Paige [facility] 09/18/2022 10:44 AM  

## 2022-09-18 NOTE — Op Note (Signed)
[facility]  Percutaneous Study/Intervention Procedural Note   Date of Surgery: 09/18/2022,6:32 PM  Surgeon:Elmyra Banwart, Latina Craver   Pre-operative Diagnosis: Symptomatic pulmonary emboli with right heart strain and hypoxia  Post-operative diagnosis:  Same  Procedure(s) Performed:  1.  Contrast injection right heart and bilateral pulmonary arteries  2.  Thrombolysis bilateral pulmonary arteries with 10 mg of TPA  3.  Mechanical thrombectomy bilateral lobar pulmonary arteries for removal of pulmonary emboli using the Penumbra CAT 12 thrombectomy catheter.  4.  Selective catheter placement right upper lobe pulmonary artery, middle lobe pulmonary artery and lower lobe pulmonary artery  5.  Selective catheter placement left upper lobe pulmonary artery and lower lobe pulmonary artery    Anesthesia: Conscious sedation was administered under my direct supervision by the interventional radiology RN. IV Versed plus fentanyl were utilized. Continuous ECG, pulse oximetry and blood pressure was monitored throughout the entire procedure.  Versed and fentanyl were administered intravenously.  Conscious sedation was administered for a total of 69 minutes.  Sheath: 14 French Cook sheath right common femoral vein antegrade  Contrast: 80 cc   Fluoroscopy Time: 14.6 minutes  Indications:  Patient presents with pulmonary emboli. The patient is symptomatic with hypoxemia and dyspnea on exertion.  There is evidence of right heart strain on the CT angiogram and the patient is both hypoxic as well as hypotensive and has been started on Levophed to maintain her blood pressure. The patient is otherwise a good candidate for intervention and even the long-term benefits pulmonary angiography with thrombolysis is offered. The risks and benefits are reviewed long-term benefits are discussed. All questions are answered patient agrees to proceed.  Procedure:  Brandi Grewal Priceis a 37 y.o. female who was  identified and appropriate procedural time out was performed.  The patient was then placed supine on the table and prepped and draped in the usual sterile fashion.  Ultrasound was used to evaluate the right common femoral vein.  It was patent, as it was echolucent and compressible.  A digital ultrasound image was acquired for the permanent record.  A micropuncture needle was used to access the right common femoral vein under direct ultrasound guidance.  A microwire was then advanced under fluoroscopic guidance followed by micro-sheath.  A 0.035 J wire was advanced without resistance and a 5Fr sheath was placed.  Perclose devices were then used in a preclose fashion and then upsized to an 14 [place] sheath.    The wire and pigtail catheter were then negotiated into the right atrium and bolus injection of contrast was utilized to demonstrate the right ventricle and the pulmonary artery outflow.   TPA was reconstituted and delivered onto the table. A total of 10 milligrams of TPA was utilized.  5 was administered on the left side and 5 was administered on the right side. This was then allowed to dwell for 20-30 minutes.  The J-wire and pigtail catheter was advanced up to the right atrium where a bolus injection contrast was used to demonstrate the pulmonary artery outflow.  Stiff angled Glidewire was then exchanged for the J-wire and the pigtail catheter was used to select the pulmonary outflow track.  The left main pulmonary artery was evaluated first.  The pigtail catheter was then advanced into the left main pulmonary artery.  Then with the catheter in the left main pulmonary artery bolus injection contrast was utilized to demonstrate the thrombus as well as the segmental pulmonary artery vasculature. This demonstrated thrombus in the distal  left main pulmonary artery extending into the left upper lobe artery as well as the left lower lobe artery and noting that it was near occlusive.  The pigtail catheter  was then advanced out so that it was positioned within the thrombus and 5 milligrams of TPA were infused directly into the clot.  After an appropriate dwell time the Amplatz wire was reintroduced through the pigtail catheter and the pigtail catheter removed.  The Penumbra Cat 12 extra torque catheter was then advanced into the thrombus in the left lower lobe pulmonary artery.  Hand-injection contrast was used to verify the positioning and evaluate the distal anatomy.  Mechanical aspiration was performed using the CAT 12 catheter and a separator.  After multiple passes the catheter was then repositioned into the left upper lobe pulmonary artery and again hand-injection contrast was performed to verify positioning and evaluate the distal anatomy.  Multiple passes were made using mechanical aspiration in association with a separator.  Once there was free flow of blood from both lobar arteries the catheter was repositioned to the left main pulmonary artery.  Hand-injection contrast was then performed to give an assessment of the left pulmonary vasculature and the effectiveness of thrombectomy.  Satisfied with the thrombectomy on the left, I then used the penumbra CAT 12 device as well as a Glidewire and an angled catheter to select the right main pulmonary artery  A select catheter was then advanced over the Amplatz wire into the distal right main pulmonary artery and hand-injection confirmed the thrombus.  5 mg of tPA was then injected directly into the thrombus within the right distal main pulmonary artery.  After an appropriate dwell time the Amplatz wire was reintroduced through the select catheter and the select catheter removed.  The Penumbra Cat 8 extra torque catheter was then advanced into the thrombus in the right lower lobe pulmonary artery.  Hand-injection contrast was used to verify positioning and evaluate the distal anatomy.  Mechanical aspiration was performed using the CAT 12 catheter and a  separator.  After multiple passes the catheter was then repositioned into the right middle lobe pulmonary artery and again hand-injection contrast was performed to verify position and evaluate the distal anatomy.  Multiple passes were made using mechanical aspiration in association with a separator.  Lastly, using a combination of the separator catheter and Glidewire the CAT 12 device was negotiated into the right upper lobe pulmonary artery.  Hand-injection of contrast was used to verify positioning and evaluate the distal anatomy.  Multiple passes were then performed using the separator with the CAT 12 penumbra catheter.  Once there was free flow of blood from all 3 lobar arteries the catheter was repositioned to the right main pulmonary artery.  Hand-injection contrast was then used to give an assessment of the effectiveness of thrombectomy.  Pigtail catheter was then reintroduced over the Amplatz wire after removing the penumbra catheter and positioned in the pulmonary outflow tract.  A bolus injection of contrast was then used to create a final image of the pulmonary vasculature.  After review these images the catheter and sheath were removed and pressure held. There were no immediate complications.    Findings:   Right heart imaging:  Right atrium and right ventricle and the pulmonary outflow tract appears normal  Right lung: The initial images of the right lung demonstrate thrombus within the distal right main pulmonary artery extending into the right upper, middle and lower lobar arteries.  There is thrombus extending into the  segmental branches as well.  Following thrombectomy there appears to be near total resolution of the previously identified thrombus.  Left lung:  The initial images of the left lung demonstrate thrombus within the distal left main pulmonary artery extending into the left upper and lower lobar arteries.  There is thrombus extending into the segmental branches as well.   Following thrombectomy there appears to be near total resolution of the previously identified thrombus.    Disposition: Patient was taken to the recovery room in stable condition having tolerated the procedure well.  Brandi Myers 09/18/2022,6:32 PM

## 2022-09-18 NOTE — Consult Note (Signed)
PHARMACY CONSULT NOTE  Pharmacy Consult for Electrolyte Monitoring and Replacement   Recent Labs: Potassium (mmol/L)  Date Value  09/18/2022 3.4 (L)   Magnesium (mg/dL)  Date Value  54/12/8117 1.9   Calcium (mg/dL)  Date Value  14/78/2956 8.5 (L)   Albumin (g/dL)  Date Value  21/30/8657 3.3 (L)   Phosphorus (mg/dL)  Date Value  84/69/6295 4.3   Sodium (mmol/L)  Date Value  09/18/2022 136     Assessment: 37 yo female presented to ED with shortness of breath and cough.  Found to have elevated D-dimer and chest CT revealed saddle pulmonary embolism.  Patient  requiring vasopressors.  Plan is for likely thrombectomy and IVC filter placement afternoon of 09/18/22.  Goal of Therapy:  WNL  Plan:  Potassium 3.4, give Kcl 10 mEq IV x 2 Follow-up electrolytes with AM labs  Brandi Myers ,PharmD Clinical Pharmacist 09/18/2022 11:10 AM

## 2022-09-18 NOTE — H&P (Signed)
NAME:  Brandi Myers, MRN:  [id number], DOB:  [birth year], LOS: 0 ADMISSION DATE:  09/18/2022, CONSULTATION DATE:  09/18/2022 REFERRING MD:  Dr. Scotty Court, CHIEF COMPLAINT:  Shortness of Breath, Cough   Brief Pt Description / Synopsis:  37 y.o. 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  Vascular Surgery plans to perform thrombectomy and potential IVC filter placement.  History of Present Illness:  Brandi Myers is a 37 year old female with a past medical history significant for right MCA stroke with residual left-sided deep weakness and March 2022, asthma, hypertension, type 2 diabetes mellitus who presents to [facility] ED on 09/18/2022 due to complaints of shortness of breath and cough.  She reports she was in her usual state of health until last night around midnight when she developed shortness of breath following smoking cigarettes.  She became more anxious and more short of breath therefore she smoked marijuana.  She still felt anxious following marijuana use, therefore she presented to the ED for further evaluation.  She reports that her mobility has been significantly affected since her stroke in 2022, she has limited movement of the left side and is wheelchair-bound.  She also reports occasional cocaine use.  ED Course: Initial Vital Signs: Temperature 98.1 F orally, blood pressure 130/86, pulse 109, respiratory rate 18, SpO2 95% on room air Significant Labs: Potassium 3.4, glucose 302, high-sensitivity troponin 10, hemoglobin 8.4, MCV 67.7, MCHC 26.3, D-dimer 8.1, pregnancy test negative COVID-19 PCR is negative Urine drug screen is pending Imaging Chest X-ray>>IMPRESSION: Bronchitis/reactive airways. CTA Chest>>IMPRESSION: Examination is POSITIVE for saddle pulmonary embolus, with additional  BILATERAL segmental pulmonary emboli. CT evidence of right heart strain (RV/LV Ratio = 1.75) consistent with at least submassive (intermediate risk) PE. Venous US of BLE>>IMPRESSION: 1. Examination is positive for bilateral lower extremity DVT in the popliteal veins, posterior tibial veins and peroneal veins. Medications Administered: IV heparin bolus and drip initiated, 2 L normal saline, peripheral Levophed  PCCM asked to admit for further workup and treatment.  Vascular surgery consulted, plans for thrombectomy and potential IVC filter placement later today.  Please see "significant hospital events" section below for full detailed hospital course.  Pertinent  Medical History   Past Medical History:  Diagnosis Date   Asthma    Diabetes mellitus without complication (HCC)    Hypertension      Micro Data:  6/6: Covid-19 PCR>>negative  Antimicrobials:   Anti-infectives (From admission, onward)    None      Significant Hospital Events: Including procedures, antibiotic start and stop dates in addition to other pertinent events   6/6: Admitted for bilateral saddle PE with evidence of right heart strain.  Requiring low dose peripheral Levophed.  PCCM asked to admit.  Found to have bilateral LE DVT'.  Vascular Surgery plans for thrombectomy and potential IVC filter placement later this afternoon.  Interim History / Subjective:  -Patient found to have bilateral PEs, has already been started on heparin infusion -Has been evaluated by vascular surgery, plans for thrombectomy this afternoon and potential IVC filter placement -Blood pressure soft which she was started on low-dose peripheral Levophed, blood pressure improved with IV fluid boluses -Patient is awake and alert, reports shortness of breath is much improved since arrival to ED, currently on room air in no acute distress  Objective   Blood pressure Marland Kitchen)  84/49, pulse (!) 113, temperature 98.1 F (36.7 C), temperature source  Oral, resp. rate (!) 30, height 5\' 5"  (1.651 m), weight 104.3 kg, SpO2 95 %.        Intake/Output Summary (Last 24 hours) at 09/18/2022 1108 Last data filed at 09/18/2022 0920 Gross per 24 hour  Intake 1000 ml  Output --  Net 1000 ml   Filed Weights   09/18/22 0037  Weight: 104.3 kg    Examination: General: Acute on chronically ill-appearing female, laying in bed, on room air, no acute distress HENT: Atraumatic, normocephalic, neck supple, no JVD Lungs: Clear breath sounds bilaterally, even, nonlabored, normal effort Cardiovascular: Tachycardia, regular rhythm, S1-S2, no murmurs, rubs, gallops Abdomen: Obese, soft, nontender, nondistended, no guarding rebound tenderness, bowel sounds positive x 4 Extremities: Left arm contracture from previous stroke, swelling to bilateral lower extremities with left greater than right Neuro: Awake and alert, oriented x 4, follows commands, noted left-sided weakness which is baseline from previous stroke, speech clear GU: Deferred  Resolved Hospital Problem list     Assessment & Plan:   #Bilateral Saddle Pulmonary Embolism with evidence of Right Heart Strain #Bilateral Lower Extremity DVT's #Hypotension: Obstructive shock PMHx: HTN, Right MCA stroke with left sided deficits in March 2022 CTA Chest on admission positive for saddle PE and additional bilateral segmental PE's with evidence of Right Heart Strain Venous US of BLE with bilateral LE DVT's in the popliteal, posterior tibial,a nd peroneal veins Pregnancy test is negative UDS is pending -Continuous cardiac monitoring -Maintain MAP >65 -Gentle IV fluids -Vasopressors as needed to maintain MAP goal -HS Troponin negative x3 (10 ~ 10 ~ 15) -Echocardiogram pending -Heparin gtt -Vascular Surgery consulted, appreciate input ~ Plan for Thrombectomy and possible IVC Filter placement this afternoon 6/6 -If pt were to decompensate further, would need to administer systemic thrombolytics -Will  need to evaluate for Thrombophilia & Hypercoagulable states in the outpatient setting once acute stage resolved  #Asthma without acute exacerbation -Supplemental O2 as needed to maintain O2 sats >92% -Follow intermittent Chest X-ray & ABG as needed -Bronchodilators prn -Pulmonary toilet as able  #Mild Hypokalemia -Monitor I&O's / urinary output -Follow BMP -Ensure adequate renal perfusion -Avoid nephrotoxic agents as able -Replace electrolytes as indicated. Pharmacy following for assistance with electrolyte replacement  #Iron Deficiency Anemia -Monitor for S/Sx of bleeding -Trend CBC -Heparin gtt for Anticoagulation/VTE Prophylaxis  -Transfuse for Hgb <7  #Diabetes Mellitus Type II with Hyperglycemia -CBG's q4h; Target range of 140 to 180 -SSI -Follow ICU Hypo/Hyperglycemia protocol -Check hemoglobin A1c       Best Practice (right click and "Reselect all SmartList Selections" daily)   Diet/type: NPO DVT prophylaxis: systemic heparin GI prophylaxis: N/A Lines: N/A Foley:  N/A Code Status:  full code Last date of multidisciplinary goals of care discussion [N/A]  6/6: Pt updated at bedside.  All questions answered.  Labs   CBC: Recent Labs  Lab 09/18/22 0052  WBC 8.9  HGB 8.4*  HCT 31.9*  MCV 67.7*  PLT 232    Basic Metabolic Panel: Recent Labs  Lab 09/18/22 0052  NA 136  K 3.4*  CL 100  CO2 25  GLUCOSE 302*  BUN 9  CREATININE 0.78  CALCIUM 8.5*   GFR: Estimated Creatinine Clearance: 115.4 mL/min (by C-G formula based on SCr of 0.78 mg/dL). Recent Labs  Lab 09/18/22 0052  WBC 8.9    Liver Function Tests: No results for input(s): "AST", "ALT", "ALKPHOS", "BILITOT", "PROT", "ALBUMIN" in the last 168  hours. No results for input(s): "LIPASE", "AMYLASE" in the last 168 hours. No results for input(s): "AMMONIA" in the last 168 hours.  ABG    Component Value Date/Time   PHART 7.467 (H) 07/28/2020 0823   PCO2ART 39.3 07/28/2020 0823   PO2ART  62.4 (L) 07/28/2020 0823   HCO3 28.0 07/28/2020 0823   TCO2 29 09/13/2020 1103   ACIDBASEDEF 0.9 07/04/2020 1307   O2SAT 90.9 07/28/2020 0823     Coagulation Profile: No results for input(s): "INR", "PROTIME" in the last 168 hours.  Cardiac Enzymes: No results for input(s): "CKTOTAL", "CKMB", "CKMBINDEX", "TROPONINI" in the last 168 hours.  HbA1C: Hgb A1c MFr Bld  Date/Time Value Ref Range Status  01/15/2021 10:32 AM 5.5 4.8 - 5.6 % Final    Comment:    (NOTE) Pre diabetes:          5.7%-6.4%  Diabetes:              >6.4%  Glycemic control for   <7.0% adults with diabetes   07/04/2020 06:14 PM 10.6 (H) 4.8 - 5.6 % Final    Comment:    (NOTE) Pre diabetes:          5.7%-6.4%  Diabetes:              >6.4%  Glycemic control for   <7.0% adults with diabetes     CBG: No results for input(s): "GLUCAP" in the last 168 hours.  Review of Systems:   Positives in BOLD: Gen: Denies fever, chills, weight change, fatigue, night sweats HEENT: Denies blurred vision, double vision, hearing loss, tinnitus, sinus congestion, rhinorrhea, sore throat, neck stiffness, dysphagia PULM: Denies shortness of breath, cough, sputum production, hemoptysis, wheezing CV: Denies chest pain, edema, orthopnea, paroxysmal nocturnal dyspnea, palpitations GI: Denies abdominal pain, nausea, vomiting, diarrhea, hematochezia, melena, constipation, change in bowel habits GU: Denies dysuria, hematuria, polyuria, oliguria, urethral discharge Endocrine: Denies hot or cold intolerance, polyuria, polyphagia or appetite change Derm: Denies rash, dry skin, scaling or peeling skin change Heme: Denies easy bruising, bleeding, bleeding gums Neuro: Denies headache, numbness, weakness, slurred speech, loss of memory or consciousness   Past Medical History:  She,  has a past medical history of Asthma, Diabetes mellitus without complication (HCC), and Hypertension.   Surgical History:   Past Surgical History:   Procedure Laterality Date   BUBBLE STUDY  07/19/2020   Procedure: BUBBLE STUDY;  Surgeon: Little Ishikawa, MD;  Location: [facility] ENDOSCOPY;  Service: Cardiovascular;;   CESAREAN SECTION     CHOLECYSTECTOMY     CRANIOTOMY Right 07/06/2020   Procedure: RIGHT CRANIECTOMY WITH PLACEMENT OF BONE FLAP IN ABDOMEN;  Surgeon: Maeola Harman, MD;  Location: [facility] OR;  Service: Neurosurgery;  Laterality: Right;   CRANIOTOMY N/A 01/18/2021   Procedure: Replacement of right craniectomy skull flap from right abdomen;  Surgeon: Maeola Harman, MD;  Location: [facility] OR;  Service: Neurosurgery;  Laterality: N/A;   ESOPHAGOGASTRODUODENOSCOPY (EGD) WITH PROPOFOL N/A 07/26/2020   Procedure: ESOPHAGOGASTRODUODENOSCOPY (EGD) WITH PROPOFOL;  Surgeon: Violeta Gelinas, MD;  Location: [facility] ENDOSCOPY;  Service: General;  Laterality: N/A;   IR ANGIO INTRA EXTRACRAN SEL COM CAROTID INNOMINATE UNI L MOD SED  07/04/2020   IR ANGIO VERTEBRAL SEL VERTEBRAL UNI R MOD SED  07/04/2020   IR CT HEAD LTD  07/04/2020   IR CT HEAD LTD  07/04/2020   IR PERCUTANEOUS ART THROMBECTOMY/INFUSION INTRACRANIAL INC DIAG ANGIO  07/04/2020   PEG PLACEMENT N/A 07/26/2020   Procedure: PERCUTANEOUS ENDOSCOPIC GASTROSTOMY (  PEG) PLACEMENT;  Surgeon: Violeta Gelinas, MD;  Location: [facility] ENDOSCOPY;  Service: General;  Laterality: N/A;   PERIPHERAL VASCULAR BALLOON ANGIOPLASTY  09/13/2020   Procedure: PERIPHERAL VASCULAR BALLOON ANGIOPLASTY;  Surgeon: Cephus Shelling, MD;  Location: [facility] INVASIVE CV LAB;  Service: Cardiovascular;;  Radial    RADIOLOGY WITH ANESTHESIA N/A 07/04/2020   Procedure: IR WITH ANESTHESIA;  Surgeon: Radiologist, Medication, MD;  Location: [facility] OR;  Service: Radiology;  Laterality: N/A;   TEE WITHOUT CARDIOVERSION N/A 07/19/2020   Procedure: TRANSESOPHAGEAL ECHOCARDIOGRAM (TEE);  Surgeon: Little Ishikawa, MD;  Location: [facility] ENDOSCOPY;  Service: Cardiovascular;  Laterality: N/A;   THROMBECTOMY BRACHIAL ARTERY Right 07/05/2020   Procedure:  RIGHT UPPER EXTREMITY THROMBECTOMY;  Surgeon: Cephus Shelling, MD;  Location: [facility] OR;  Service: Vascular;  Laterality: Right;   UPPER EXTREMITY ANGIOGRAPHY N/A 09/13/2020   Procedure: UPPER EXTREMITY ANGIOGRAPHY - Right;  Surgeon: Cephus Shelling, MD;  Location: [facility] INVASIVE CV LAB;  Service: Cardiovascular;  Laterality: N/A;     Social History:   reports that she has been smoking cigarettes. She has been smoking an average of .25 packs per day. She has never used smokeless tobacco. She reports that she does not currently use alcohol. She reports that she does not use drugs.   Family History:  Her family history includes Diabetes in her maternal grandmother.   Allergies Allergies  Allergen Reactions   Norco [Hydrocodone-Acetaminophen] Itching, Nausea And Vomiting and Swelling     Home Medications  Prior to Admission medications   Medication Sig Start Date End Date Taking? Authorizing Provider  acetaminophen (TYLENOL) 325 MG tablet Take 2 tablets (650 mg total) by mouth every 4 (four) hours as needed for mild pain (or temp > 37.5 C (99.5 F)). 08/03/20   Russella Dar, NP  albuterol (VENTOLIN HFA) 108 (90 Base) MCG/ACT inhaler Inhale 1-2 puffs into the lungs every 6 (six) hours as needed for wheezing or shortness of breath.    [provider]  amoxicillin (AMOXIL) 500 MG capsule Take 1 capsule (500 mg total) by mouth 3 (three) times daily. Patient not taking: Reported on 07/12/2022 12/08/21   Menshew, Charlesetta Ivory, PA-C  aspirin EC 81 MG tablet Take 81 mg by mouth daily. Swallow whole.    [provider]  atorvastatin (LIPITOR) 40 MG tablet Place 1 tablet (40 mg total) into feeding tube daily at 6 PM. Patient taking differently: Take 40 mg by mouth daily. 08/03/20   Russella Dar, NP  BENZAMYCIN gel Apply 1 application topically in the morning and at bedtime. Patient not taking: Reported on 07/12/2022 12/26/20   [provider]  cyclobenzaprine (FLEXERIL)  10 MG tablet Take 10 mg by mouth every 8 (eight) hours as needed for muscle spasms. 06/14/22   [provider]  gabapentin (NEURONTIN) 300 MG capsule Take 300 mg by mouth 2 (two) times daily.    [provider]  insulin aspart (NOVOLOG) 100 UNIT/ML injection Inject 0-15 Units into the skin every 4 (four) hours. Correction coverage: Moderate (average weight, post-op)  CBG < 70: Implement Hypoglycemia Standing Orders and refer to Hypoglycemia Standing Orders sidebar report  CBG 70 - 120: 0 units  CBG 121 - 150: 2 units  CBG 151 - 200: 3 units  CBG 201 - 250: 5 units  CBG 251 - 300: 8 units  CBG 301 - 350: 11 units  CBG 351 - 400: 15 units  CBG > 400 call MD and obtain STAT  lab verification Patient not taking: No sig reported 08/03/20   Russella Dar, NP  insulin aspart (NOVOLOG) 100 UNIT/ML injection Inject 6 Units into the skin every 4 (four) hours. Patient not taking: No sig reported 08/03/20   Russella Dar, NP  insulin detemir (LEVEMIR) 100 UNIT/ML injection Inject 0.35 mLs (35 Units total) into the skin 2 (two) times daily. Patient taking differently: Inject 40 Units into the skin daily. 08/03/20   Russella Dar, NP  melatonin 3 MG TABS tablet Place 1 tablet (3 mg total) into feeding tube at bedtime. Patient taking differently: Take 3 mg by mouth at bedtime. 08/03/20   Russella Dar, NP  metoprolol tartrate (LOPRESSOR) 50 MG tablet Place 1 tablet (50 mg total) into feeding tube 2 (two) times daily. Patient taking differently: Take 50 mg by mouth 2 (two) times daily. 08/03/20   Russella Dar, NP  Multiple Vitamin (MULTIVITAMIN WITH MINERALS) TABS tablet Place 1 tablet into feeding tube daily. Patient taking differently: Take 1 tablet by mouth daily. 08/04/20   Russella Dar, NP  RETIN-A 0.025 % cream Apply 1 application topically at bedtime. 12/20/20   [provider]  risperiDONE (RISPERDAL) 0.5 MG tablet Take 1 tablet (0.5 mg total) by mouth 2 (two)  times daily as needed (agitation). 07/11/22 08/10/22  Charm Rings, NP  sertraline (ZOLOFT) 25 MG tablet Place 1 tablet (25 mg total) into feeding tube daily. Patient taking differently: Take 25 mg by mouth daily. 08/04/20   Russella Dar, NP  traMADol (ULTRAM) 50 MG tablet Take 1 tablet (50 mg total) by mouth 3 (three) times daily as needed. Patient not taking: Reported on 07/12/2022 12/08/21   Menshew, Charlesetta Ivory, PA-C  TRULICITY 0.75 MG/0.5ML SOPN Inject 0.75 mg into the skin every Saturday. Patient not taking: Reported on 07/12/2022 12/25/20   [provider]  valACYclovir (VALTREX) 1000 MG tablet Take 1,000 mg by mouth 2 (two) times daily. Patient not taking: Reported on 07/12/2022 05/27/22   [provider]  valproic acid (DEPAKENE) 250 MG capsule Take 250 mg by mouth 2 (two) times daily. Patient not taking: Reported on 07/12/2022    [provider]     Critical care time: 55 minutes     Harlon Ditty, AGACNP-BC [facility] Prefer epic messenger for cross cover needs If after hours, please call E-link

## 2022-09-18 NOTE — Interval H&P Note (Signed)
History and Physical Interval Note:  09/18/2022 4:40 PM  Brandi Myers  has presented today for surgery, with the diagnosis of Pulmonary Embolism Saddle PE.  The various methods of treatment have been discussed with the patient and family. After consideration of risks, benefits and other options for treatment, the patient has consented to  Procedure(s): PULMONARY THROMBECTOMY (Bilateral) as a surgical intervention.  The patient's history has been reviewed, patient examined, no change in status, stable for surgery.  I have reviewed the patient's chart and labs.  Questions were answered to the patient's satisfaction.     Levora Dredge

## 2022-09-18 NOTE — ED Triage Notes (Signed)
EMS brings pt in from home for generalized weakness

## 2022-09-19 ENCOUNTER — Encounter: Payer: Self-pay | Admitting: Vascular Surgery

## 2022-09-19 DIAGNOSIS — R578 Other shock: Secondary | ICD-10-CM | POA: Diagnosis not present

## 2022-09-19 DIAGNOSIS — I2602 Saddle embolus of pulmonary artery with acute cor pulmonale: Secondary | ICD-10-CM | POA: Diagnosis not present

## 2022-09-19 DIAGNOSIS — D508 Other iron deficiency anemias: Secondary | ICD-10-CM

## 2022-09-19 LAB — RENAL FUNCTION PANEL
Albumin: 2.6 g/dL — ABNORMAL LOW (ref 3.5–5.0)
Anion gap: 6 (ref 5–15)
BUN: 5 mg/dL — ABNORMAL LOW (ref 6–20)
CO2: 26 mmol/L (ref 22–32)
Calcium: 7.7 mg/dL — ABNORMAL LOW (ref 8.9–10.3)
Chloride: 104 mmol/L (ref 98–111)
Creatinine, Ser: 0.58 mg/dL (ref 0.44–1.00)
GFR, Estimated: >60 mL/min (ref >60)
Glucose, Bld: 221 mg/dL — ABNORMAL HIGH (ref 70–99)
Phosphorus: 2.2 mg/dL — ABNORMAL LOW (ref 2.5–4.6)
Potassium: 2.9 mmol/L — ABNORMAL LOW (ref 3.5–5.1)
Sodium: 136 mmol/L (ref 135–145)

## 2022-09-19 LAB — GLUCOSE, CAPILLARY
Glucose-Capillary: 175 mg/dL — ABNORMAL HIGH (ref 70–99)
Glucose-Capillary: 205 mg/dL — ABNORMAL HIGH (ref 70–99)
Glucose-Capillary: 183 mg/dL — ABNORMAL HIGH (ref 70–99)
Glucose-Capillary: 206 mg/dL — ABNORMAL HIGH (ref 70–99)
Glucose-Capillary: 199 mg/dL — ABNORMAL HIGH (ref 70–99)

## 2022-09-19 LAB — CBC
HCT: 25.7 % — ABNORMAL LOW (ref 36.0–46.0)
Hemoglobin: 6.7 g/dL — ABNORMAL LOW (ref 12.0–15.0)
MCH: 17.5 pg — ABNORMAL LOW (ref 26.0–34.0)
MCHC: 26.1 g/dL — ABNORMAL LOW (ref 30.0–36.0)
MCV: 67.1 fL — ABNORMAL LOW (ref 80.0–100.0)
Platelets: 221 10*3/uL (ref 150–400)
RBC: 3.83 MIL/uL — ABNORMAL LOW (ref 3.87–5.11)
RDW: 20.3 % — ABNORMAL HIGH (ref 11.5–15.5)
WBC: 9.6 10*3/uL (ref 4.0–10.5)
nRBC: 0.2 % (ref 0.0–0.2)

## 2022-09-19 LAB — PREPARE RBC (CROSSMATCH)

## 2022-09-19 LAB — HEPARIN LEVEL (UNFRACTIONATED)
Heparin Unfractionated: 0.1 IU/mL — ABNORMAL LOW (ref 0.30–0.70)
Heparin Unfractionated: 0.14 IU/mL — ABNORMAL LOW (ref 0.30–0.70)
Heparin Unfractionated: 0.17 IU/mL — ABNORMAL LOW (ref 0.30–0.70)

## 2022-09-19 LAB — HEMOGLOBIN AND HEMATOCRIT, BLOOD
HCT: 29 % — ABNORMAL LOW (ref 36.0–46.0)
Hemoglobin: 8 g/dL — ABNORMAL LOW (ref 12.0–15.0)

## 2022-09-19 LAB — BPAM RBC: Unit Type and Rh: 5100

## 2022-09-19 LAB — TYPE AND SCREEN: Antibody Screen: NEGATIVE

## 2022-09-19 LAB — HEMOGLOBIN A1C
Hgb A1c MFr Bld: 8 % — ABNORMAL HIGH (ref 4.8–5.6)
Mean Plasma Glucose: 182.9 mg/dL

## 2022-09-19 LAB — MAGNESIUM: Magnesium: 1.8 mg/dL (ref 1.7–2.4)

## 2022-09-19 MED ORDER — SODIUM CHLORIDE 0.9% IV SOLUTION: Freq: Once | INTRAVENOUS | Status: AC

## 2022-09-19 MED ORDER — POTASSIUM CHLORIDE 10 MEQ/100ML IV SOLN
10.0000 meq | INTRAVENOUS | Status: DC
  Filled 2022-09-19 (×4): qty 100

## 2022-09-19 MED ORDER — POTASSIUM CHLORIDE CRYS ER 20 MEQ PO TBCR
40.0000 meq | EXTENDED_RELEASE_TABLET | ORAL | Status: AC
  Administered 2022-09-19 (×2): 40 meq via ORAL
  Filled 2022-09-19 (×2): qty 2

## 2022-09-19 MED ORDER — IBUPROFEN 400 MG PO TABS
400.0000 mg | ORAL_TABLET | Freq: Four times a day (QID) | ORAL | Status: DC | PRN
  Administered 2022-09-19 – 2022-09-21 (×2): 400 mg via ORAL
  Filled 2022-09-19 (×2): qty 1

## 2022-09-19 MED ORDER — HEPARIN BOLUS VIA INFUSION
2400.0000 [IU] | Freq: Once | INTRAVENOUS | Status: AC
  Administered 2022-09-19: 2400 [IU] via INTRAVENOUS
  Filled 2022-09-19: qty 2400

## 2022-09-19 MED ORDER — FENTANYL CITRATE PF 50 MCG/ML IJ SOSY
50.0000 ug | PREFILLED_SYRINGE | Freq: Once | INTRAMUSCULAR | Status: AC
  Administered 2022-09-19: 50 ug via INTRAVENOUS
  Filled 2022-09-19: qty 1

## 2022-09-19 MED ORDER — FENTANYL CITRATE PF 50 MCG/ML IJ SOSY
25.0000 ug | PREFILLED_SYRINGE | INTRAMUSCULAR | Status: DC | PRN
  Administered 2022-09-19 – 2022-09-20 (×4): 25 ug via INTRAVENOUS
  Filled 2022-09-19 (×4): qty 1

## 2022-09-19 MED ORDER — TRAMADOL HCL 50 MG PO TABS
50.0000 mg | ORAL_TABLET | Freq: Once | ORAL | Status: AC
  Administered 2022-09-19: 50 mg via ORAL
  Filled 2022-09-19: qty 1

## 2022-09-19 MED ORDER — TRAMADOL HCL 50 MG PO TABS
50.0000 mg | ORAL_TABLET | Freq: Four times a day (QID) | ORAL | Status: DC | PRN
  Administered 2022-09-19 – 2022-09-20 (×2): 50 mg via ORAL
  Filled 2022-09-19 (×2): qty 1

## 2022-09-19 NOTE — Consult Note (Signed)
PHARMACY CONSULT NOTE  Pharmacy Consult for Electrolyte Monitoring and Replacement   Recent Labs: Potassium (mmol/L)  Date Value  09/19/2022 2.9 (L)   Magnesium (mg/dL)  Date Value  16/01/9603 1.8   Calcium (mg/dL)  Date Value  54/12/8117 7.7 (L)   Albumin (g/dL)  Date Value  14/78/2956 2.6 (L)   Phosphorus (mg/dL)  Date Value  21/30/8657 2.2 (L)   Sodium (mmol/L)  Date Value  09/19/2022 136     Assessment: 37 yo female presented to ED with shortness of breath and cough.  Found to have elevated D-dimer and chest CT revealed saddle pulmonary embolism.  Patient  requiring vasopressors.  Plan is for likely thrombectomy and IVC filter placement afternoon of 09/18/22.  Goal of Therapy:  WNL  Plan:  Potassium 3.4, give Kcl 10 mEq IV x 2 Follow-up electrolytes with AM labs  Jaynie Bream ,PharmD Clinical Pharmacist 09/19/2022 7:50 AM

## 2022-09-19 NOTE — Consult Note (Signed)
ANTICOAGULATION CONSULT NOTE  Pharmacy Consult for Heparin Infusion Indication: pulmonary embolus  Allergies  Allergen Reactions   Norco [Hydrocodone-Acetaminophen] Itching, Nausea And Vomiting and Swelling    Patient Measurements: Height: 5\' 5"  (165.1 cm) Weight: 93.5 kg (206 lb 2.1 oz) IBW/kg (Calculated) : 57 Heparin Dosing Weight: 81.2 kg  Vital Signs: Temp: 98.6 F (37 C) (06/07 1236) Temp Source: Oral (06/07 1236) BP: 100/73 (06/07 1300) Pulse Rate: 79 (06/07 1300)  Labs: Recent Labs    09/18/22 0052 09/18/22 0317 09/18/22 0805 09/18/22 1030 09/19/22 0304 09/19/22 0503  HGB 8.4*  --   --   --   --  6.7*  HCT 31.9*  --   --   --   --  25.7*  PLT 232  --   --   --   --  221  APTT  --   --   --  35  --   --   LABPROT  --   --   --  15.2  --   --   INR  --   --   --  1.2  --   --   HEPARINUNFRC  --   --   --  <0.10* 0.10*  --   CREATININE 0.78  --   --   --   --  0.58  TROPONINIHS 10 10 15   --   --   --      Estimated Creatinine Clearance: 108.8 mL/min (by C-G formula based on SCr of 0.58 mg/dL).   Medical History: Past Medical History:  Diagnosis Date   Asthma    Diabetes mellitus without complication (HCC)    Hypertension     Medications:  Scheduled:   Chlorhexidine Gluconate Cloth  6 each Topical Daily   gabapentin  200 mg Oral BID   insulin aspart  0-15 Units Subcutaneous Q4H   Infusions:   sodium chloride Stopped (09/18/22 1155)   heparin 1,400 Units/hr (09/19/22 1300)   norepinephrine (LEVOPHED) Adult infusion 3 mcg/min (09/19/22 1300)   PRN: docusate sodium, fentaNYL (SUBLIMAZE) injection, ibuprofen, iodixanol, polyethylene glycol, traMADol  Assessment: Brandi Myers is a 37 y.o. female presenting with SOB and cough. PMH significant for T2DM, HTN. Patient was not on [facility] PTA per chart review. D-dimer found to be elevated and chest CT showed massive/submassive saddle pulmonary embolus. Heparin infusion was held due to Hgb <7. Suspected  cause of Hgb drop is heavy menses. Per MD, will resume infusion due to clot burden (saddle PE and VTE) and transfuse as needed. Mechanical thrombectomy performed 09/18/2022. Pharmacy has been consulted to initiate and manage heparin infusion.   Baseline Labs: aPTT 35, HL <0.10, PT 15.2, INR 1.2, Hgb 8.4, Hct 31.9, Plt 232   Goal of Therapy:  Heparin level 0.3-0.7 units/ml Monitor platelets by anticoagulation protocol: Yes  Date Time HL Rate/Comment  6/7 0304 0.10 1300/subtherapeutic 6/7 0503 --- 0/hold infusion for Hgb of 6.7 6/7 0944 --- 1400/resume infusion per MD 6/7 1558 0.14 1400/subtherapeutic  Plan:  No boluses per MD Increase heparin infusion to 1600 units/hr Check HL in 6 hours  Continue to monitor H&H and platelets daily while on heparin infusion   Celene Squibb, PharmD PGY1 Pharmacy Resident 09/19/2022 4:21 PM

## 2022-09-19 NOTE — Progress Notes (Signed)
Progress Note    09/19/2022 8:20 AM 1 Day Post-Op  Subjective:  Brandi Myers is a 37 y.o. female who is now POD #1 from a pulmonary thrombectomy.  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  Upon workup patient was noted to have bilateral lower extremity DVTs from popliteal veins, peroneal veins, and posterior tibial veins.  Patient is resting comfortably in bed this morning.  Blood pressure remains soft at 86/60 while patient remains on Levophed 3 mcg/hr. Patient remains on heparin Infusion.  Patient's hemoglobin was noted to be low this morning this is most likely related to her monthly cycle which has been heavy while being on heparin infusion.  Patient still requires 3 L of nasal cannula oxygen with saturations ranging from 88% to 92%.   Vitals:   09/19/22 0703 09/19/22 0751  BP:    Pulse: 87   Resp: (!) 22   Temp:  99.5 F (37.5 C)  SpO2: 92% 94%   Physical Exam: Cardiac:  Tachycardia, Rate 110-120's regular rhythm, S1-S2, no murmurs rubs or gallops Lungs: Normal respiratory effort, clear breath sounds bilaterally on auscultation without rales rhonchi or wheezing noted. Incisions: Right groin with dressing clean dry and intact. Extremities: Bilateral lower extremities warm to touch, left lower extremity with +2 edema.  Nonpalpable pulses due to edema. Abdomen: Obese, positive bowel sounds all 4 quadrants, soft, nontender nondistended.  No guarding or rebound tenderness noted. Neurologic: Awake and alert, oriented x 4, follows commands, noted left-sided weakness which is baseline from previous CVA.  Patient's speech is clear with left upper arm contraction.  CBC    Component Value Date/Time   WBC 9.6 09/19/2022 0503   RBC 3.83 (L) 09/19/2022 0503   HGB 6.7 (L) 09/19/2022 0503   HGB 11.4 (L) 10/22/2012 1800   HCT 25.7 (L) 09/19/2022 0503   HCT 31.4 (L) 10/23/2012 0518   PLT 221 09/19/2022 0503   PLT 191  10/22/2012 1800   MCV 67.1 (L) 09/19/2022 0503   MCV 87 10/22/2012 1800   MCH 17.5 (L) 09/19/2022 0503   MCHC 26.1 (L) 09/19/2022 0503   RDW 20.3 (H) 09/19/2022 0503   RDW 14.3 10/22/2012 1800   LYMPHSABS 3.3 07/22/2022 0043   LYMPHSABS 2.4 10/22/2012 1800   MONOABS 0.7 07/22/2022 0043   MONOABS 1.0 (H) 10/22/2012 1800   EOSABS 0.2 07/22/2022 0043   EOSABS 0.1 10/22/2012 1800   BASOSABS 0.1 07/22/2022 0043   BASOSABS 0.1 10/22/2012 1800    BMET    Component Value Date/Time   NA 136 09/19/2022 0503   K 2.9 (L) 09/19/2022 0503   CL 104 09/19/2022 0503   CO2 26 09/19/2022 0503   GLUCOSE 221 (H) 09/19/2022 0503   BUN 5 (L) 09/19/2022 0503   CREATININE 0.58 09/19/2022 0503   CALCIUM 7.7 (L) 09/19/2022 0503   GFRNONAA >60 09/19/2022 0503   GFRAA >60 01/04/2018 2056    INR    Component Value Date/Time   INR 1.2 09/18/2022 1030     Intake/Output Summary (Last 24 hours) at 09/19/2022 0820 Last data filed at 09/19/2022 0700 Gross per 24 hour  Intake 3779.11 ml  Output 1675 ml  Net 2104.11 ml     Assessment/Plan:  37 y.o. female is s/p Pulmonary Thrombectomy 1 Day Post-Op   PLAN: Titrate and wean Levophed as appropriate. Heparin on hold for a.m. drop of hemoglobin to 6.7 due to heavy menses. Patient to be  transfused 1 unit of packed red blood cells and restart heparin drip afterwards. Pain control as needed. Advance diet as tolerated.   DVT prophylaxis: Heparin infusion.   Marcie [facility] 09/19/2022 8:20 AM

## 2022-09-19 NOTE — Inpatient Diabetes Management (Signed)
Inpatient Diabetes Program Recommendations  AACE/ADA: New Consensus Statement on Inpatient Glycemic Control (2015)  Target Ranges:  Prepandial:   less than 140 mg/dL      Peak postprandial:   less than 180 mg/dL (1-2 hours)      Critically ill patients:  140 - 180 mg/dL   Lab Results  Component Value Date   GLUCAP 205 (H) 09/19/2022   HGBA1C 5.5 01/15/2021    Review of Glycemic Control  Latest Reference Range & Units 09/18/22 12:22 09/18/22 20:16 09/18/22 23:42 09/19/22 04:07 09/19/22 07:29  Glucose-Capillary 70 - 99 mg/dL 454 (H) 098 (H) 119 (H) 175 (H) 205 (H)  Diabetes history: DM 2 Outpatient Diabetes medications:  Novolog 0-15 units q 4 hours Levemir 40 units daily Current orders for Inpatient glycemic control:  Novolog 0-15 units q 4 hours   Inpatient Diabetes Program Recommendations:     Consider adding Levemir 10 units bid (1/2 of home dose).   Thanks,  Beryl Meager, RN, BC-ADM Inpatient Diabetes Coordinator Pager [phone]  (8a-5p)

## 2022-09-19 NOTE — Consult Note (Addendum)
ANTICOAGULATION CONSULT NOTE - Initial Consult  Pharmacy Consult for heparin infusion Indication: pulmonary embolus  Allergies  Allergen Reactions   Norco [Hydrocodone-Acetaminophen] Itching, Nausea And Vomiting and Swelling    Patient Measurements: Height: 5\' 5"  (165.1 cm) Weight: 93.5 kg (206 lb 2.1 oz) IBW/kg (Calculated) : 57 Heparin Dosing Weight: 81.2 kg  Vital Signs: Temp: 99.5 F (37.5 C) (06/07 0751) Temp Source: Oral (06/07 0751) BP: 108/75 (06/07 0615) Pulse Rate: 87 (06/07 0703)  Labs: Recent Labs    09/18/22 0052 09/18/22 0317 09/18/22 0805 09/18/22 1030 09/19/22 0304 09/19/22 0503  HGB 8.4*  --   --   --   --  6.7*  HCT 31.9*  --   --   --   --  25.7*  PLT 232  --   --   --   --  221  APTT  --   --   --  35  --   --   LABPROT  --   --   --  15.2  --   --   INR  --   --   --  1.2  --   --   HEPARINUNFRC  --   --   --  <0.10* 0.10*  --   CREATININE 0.78  --   --   --   --  0.58  TROPONINIHS 10 10 15   --   --   --      Estimated Creatinine Clearance: 108.8 mL/min (by C-G formula based on SCr of 0.58 mg/dL).   Medical History: Past Medical History:  Diagnosis Date   Asthma    Diabetes mellitus without complication (HCC)    Hypertension     Medications:  No current anticoagulation per pharmacist review.  Assessment: 37 yo female presented to the ED with chief complaint of shortness of breath and cough. D-dimer found to be elevated and chest CT showed saddle pulmonary embolus.  Pharmacy consulted to start heparin infusion.  Goal of Therapy:  Heparin level 0.3-0.7 units/ml Monitor platelets by anticoagulation protocol: Yes   Plan: Due to clot burden (saddle PE and VTE), plan is to restart heparin and transfuse as needed.  Restart heparin at 1400 units/hr. No bolus May consider bolusing based on levels per PCCM team- depending on H&H and patient's clinical status at time of level check.  Will recheck HL 6 hrs after restart    Elliot Gurney, PharmD, BCPS Clinical Pharmacist  09/19/2022 9:28 AM

## 2022-09-19 NOTE — Progress Notes (Signed)
NAME:  Brandi Myers, MRN:  [id number], DOB:  [birth year], LOS: 1 ADMISSION DATE:  09/18/2022, CONSULTATION DATE:  09/18/2022 REFERRING MD:  Dr. Scotty Court, CHIEF COMPLAINT:  Shortness of Breath, Cough   Brief Pt Description / Synopsis:  37 y.o. female with PMHx significant for Right MCA stroke with residual Left sided weakness, DM Type II, and asthma admitted with Massive/submassive bilateral Saddle Pulmonary Embolism with evidence of Right Heart Strain along with bilateral lower extremity DVT's.  Vascular Surgery performed mechanical thrombectomy.  History of Present Illness:  Brandi Myers is a 37 year old female with a past medical history significant for right MCA stroke with residual left-sided deep weakness and March 2022, asthma, hypertension, type 2 diabetes mellitus who presents to [facility] ED on 09/18/2022 due to complaints of shortness of breath and cough.  She reports she was in her usual state of health until last night around midnight when she developed shortness of breath following smoking cigarettes.  She became more anxious and more short of breath therefore she smoked marijuana.  She still felt anxious following marijuana use, therefore she presented to the ED for further evaluation.  She reports that her mobility has been significantly affected since her stroke in 2022, she has limited movement of the left side and is wheelchair-bound. She reports she has previously had been on blood thinners for prior clots, however this was stopped in December 2023 for a dental procedure and was never restarted.  She also reports prior cocaine use.  ED Course: Initial Vital Signs: Temperature 98.1 F orally, blood pressure 130/86, pulse 109, respiratory rate 18, SpO2 95% on room air Significant Labs: Potassium 3.4, glucose 302, high-sensitivity troponin 10, hemoglobin 8.4, MCV 67.7, MCHC 26.3, D-dimer 8.1, pregnancy test negative COVID-19 PCR is negative Urine drug screen is pending Imaging Chest  X-ray>>IMPRESSION: Bronchitis/reactive airways. CTA Chest>>IMPRESSION: Examination is POSITIVE for saddle pulmonary embolus, with additional BILATERAL segmental pulmonary emboli. CT evidence of right heart strain (RV/LV Ratio = 1.75) consistent with at least submassive (intermediate risk) PE. Venous US of BLE>>IMPRESSION: 1. Examination is positive for bilateral lower extremity DVT in the popliteal veins, posterior tibial veins and peroneal veins. Medications Administered: IV heparin bolus and drip initiated, 2 L normal saline, peripheral Levophed  PCCM asked to admit for further workup and treatment.  Vascular surgery consulted, plans for thrombectomy and potential IVC filter placement later today.  Please see "significant hospital events" section below for full detailed hospital course.  Pertinent  Medical History   Past Medical History:  Diagnosis Date   Asthma    Diabetes mellitus without complication (HCC)    Hypertension      Micro Data:  6/6: Covid-19 PCR>>negative  Antimicrobials:   Anti-infectives (From admission, onward)    Start     Dose/Rate Route Frequency Ordered Stop   09/18/22 1507  ceFAZolin (ANCEF) IVPB 2g/100 mL premix        2 g 200 mL/hr over 30 Minutes Intravenous 30 min pre-op 09/18/22 1507 09/18/22 1747      Significant Hospital Events: Including procedures, antibiotic start and stop dates in addition to other pertinent events   6/6: Admitted for bilateral saddle PE with evidence of right heart strain.  Requiring low dose peripheral Levophed.  PCCM asked to admit.  Found to have bilateral LE DVT'.  Vascular Surgery performed mechanical thrombectomy 6/7: Remains on low dose levophed and 2-3 L supplemental O2, Heparin was held earlier this am for drop in Hgb to 6.7 form 8.4 due to  heavy menses.  Will transfuse 1 unit and restart Heparin gtt without giving bolus.  Interim History / Subjective:  -Vascular Surgery performed thrombectomy yesterday -No  significant events noted overnight -Afebrile, remains on low dose Levophed peripherally 2-3 mcg -Reports SOB much improved, on 2L supplemental O2 -Hgb did drop to 6.4 from previous 8.4 ~ it is on her menses ~ Heparin was held this am ~ will transfuse 1 unit of pRBC's and restart Heparin gtt (NO bolus) given that she has DVT's in BLE   Objective   Blood pressure 108/75, pulse 87, temperature 99.5 F (37.5 C), temperature source Oral, resp. rate (!) 22, height 5\' 5"  (1.651 m), weight 93.5 kg, SpO2 94 %.        Intake/Output Summary (Last 24 hours) at 09/19/2022 0851 Last data filed at 09/19/2022 0700 Gross per 24 hour  Intake 3779.11 ml  Output 1675 ml  Net 2104.11 ml    Filed Weights   09/18/22 0037 09/18/22 1945 09/19/22 0500  Weight: 104.3 kg 93.5 kg 93.5 kg    Examination: General: Acute on chronically ill-appearing female, laying in bed, on 2L [place], no acute distress HENT: Atraumatic, normocephalic, neck supple, no JVD Lungs: Clear breath sounds bilaterally, even, nonlabored, normal effort Cardiovascular: Regular rate and rhythm, S1-S2, no murmurs, rubs, gallops Abdomen: Obese, soft, nontender, nondistended, no guarding rebound tenderness, bowel sounds positive x 4 Extremities: Left arm contracture from previous stroke, swelling to bilateral lower extremities with left greater than right Neuro: Awake and alert, oriented x 4, follows commands, noted left-sided weakness which is baseline from previous stroke, speech clear GU: + Menses  Resolved Hospital Problem list     Assessment & Plan:   #Massive/Submassive Bilateral Saddle Pulmonary Embolism with evidence of Right Heart Strain ~ S/p Mechanical Thrombectomy 09/18/22 #Bilateral Lower Extremity DVT's #Hypotension: Obstructive shock +/- Hypovolemic PMHx: HTN, Right MCA stroke with left sided deficits in March 2022 CTA Chest on admission positive for saddle PE and additional bilateral segmental PE's with evidence of Right Heart  Strain Venous US of BLE with bilateral LE DVT's in the popliteal, posterior tibial,a nd peroneal veins Pregnancy test is negative Echocardiogram 09/18/22: LVEF 60-65%, normal diastolic parameters, interventricular septum flattened consistent with RV pressure and volume overload, RV systolic function low normal, RV moderately enlarged, mildly elevated pulmonary artery systolic pressure -Continuous cardiac monitoring -Maintain MAP >65 -Stop IV fluids -Transfusions as indicated -Vasopressors as needed to maintain MAP goal -HS Troponin negative x3 (10 ~ 10 ~ 15) -Heparin gtt -Vascular Surgery following, appreciate input ~ s/p Thrombectomy  6/6 -Will need to evaluate for Thrombophilia & Hypercoagulable states with Hematology in the outpatient setting once acute stage resolved  #Acute Hypoxic Respiratory Failure in the setting of PE #Asthma without acute exacerbation -Treatment of PE as outlined above -Supplemental O2 as needed to maintain O2 sats >92% -Follow intermittent Chest X-ray & ABG as needed -Bronchodilators prn -Pulmonary toilet as able  #Acute Blood Loss Anemia superimposed on Iron Deficiency Anemia -Monitor for S/Sx of bleeding -Trend CBC -Heparin gtt for Anticoagulation/VTE Prophylaxis  -Transfuse for Hgb <7 ~ to receive 1 unit pRBC's 6/7  #Hypokalemia -Monitor I&O's / urinary output -Follow BMP -Ensure adequate renal perfusion -Avoid nephrotoxic agents as able -Replace electrolytes as indicated. Pharmacy following for assistance with electrolyte replacement  #Diabetes Mellitus Type II with Hyperglycemia -CBG's q4h; Target range of 140 to 180 -SSI -Follow ICU Hypo/Hyperglycemia protocol -Hemoglobin A1c is pending       Best Practice (right click and "  Reselect all SmartList Selections" daily)   Diet/type: Regular DVT prophylaxis: systemic heparin GI prophylaxis: N/A Lines: N/A Foley:  N/A Code Status:  full code Last date of multidisciplinary goals of care  discussion [6/7]  6/7: Pt updated at bedside.  All questions answered.  Labs   CBC: Recent Labs  Lab 09/18/22 0052 09/19/22 0503  WBC 8.9 9.6  HGB 8.4* 6.7*  HCT 31.9* 25.7*  MCV 67.7* 67.1*  PLT 232 221     Basic Metabolic Panel: Recent Labs  Lab 09/18/22 0052 09/19/22 0304 09/19/22 0503  NA 136  --  136  K 3.4*  --  2.9*  CL 100  --  104  CO2 25  --  26  GLUCOSE 302*  --  221*  BUN 9  --  5*  CREATININE 0.78  --  0.58  CALCIUM 8.5*  --  7.7*  MG  --  1.8  --   PHOS  --   --  2.2*    GFR: Estimated Creatinine Clearance: 108.8 mL/min (by C-G formula based on SCr of 0.58 mg/dL). Recent Labs  Lab 09/18/22 0052 09/19/22 0503  WBC 8.9 9.6     Liver Function Tests: Recent Labs  Lab 09/19/22 0503  ALBUMIN 2.6*   No results for input(s): "LIPASE", "AMYLASE" in the last 168 hours. No results for input(s): "AMMONIA" in the last 168 hours.  ABG    Component Value Date/Time   PHART 7.467 (H) 07/28/2020 0823   PCO2ART 39.3 07/28/2020 0823   PO2ART 62.4 (L) 07/28/2020 0823   HCO3 28.0 07/28/2020 0823   TCO2 29 09/13/2020 1103   ACIDBASEDEF 0.9 07/04/2020 1307   O2SAT 90.9 07/28/2020 0823     Coagulation Profile: Recent Labs  Lab 09/18/22 1030  INR 1.2    Cardiac Enzymes: No results for input(s): "CKTOTAL", "CKMB", "CKMBINDEX", "TROPONINI" in the last 168 hours.  HbA1C: Hgb A1c MFr Bld  Date/Time Value Ref Range Status  01/15/2021 10:32 AM 5.5 4.8 - 5.6 % Final    Comment:    (NOTE) Pre diabetes:          5.7%-6.4%  Diabetes:              >6.4%  Glycemic control for   <7.0% adults with diabetes   07/04/2020 06:14 PM 10.6 (H) 4.8 - 5.6 % Final    Comment:    (NOTE) Pre diabetes:          5.7%-6.4%  Diabetes:              >6.4%  Glycemic control for   <7.0% adults with diabetes     CBG: Recent Labs  Lab 09/18/22 1222 09/18/22 2016 09/18/22 2342 09/19/22 0407 09/19/22 0729  GLUCAP 261* 221* 207* 175* 205*    Review of  Systems:   Positives in BOLD: Gen: Denies fever, chills, weight change, fatigue, night sweats HEENT: Denies blurred vision, double vision, hearing loss, tinnitus, sinus congestion, rhinorrhea, sore throat, neck stiffness, dysphagia PULM: Denies shortness of breath, cough, sputum production, hemoptysis, wheezing CV: Denies chest pain, edema, orthopnea, paroxysmal nocturnal dyspnea, palpitations GI: Denies abdominal pain, nausea, vomiting, diarrhea, hematochezia, melena, constipation, change in bowel habits GU: Denies dysuria, hematuria, polyuria, oliguria, urethral discharge Endocrine: Denies hot or cold intolerance, polyuria, polyphagia or appetite change Derm: Denies rash, dry skin, scaling or peeling skin change Heme: Denies easy bruising, bleeding, bleeding gums Neuro: Denies headache, numbness, weakness, slurred speech, loss of memory or consciousness   Past Medical  History:  She,  has a past medical history of Asthma, Diabetes mellitus without complication (HCC), and Hypertension.   Surgical History:   Past Surgical History:  Procedure Laterality Date   BUBBLE STUDY  07/19/2020   Procedure: BUBBLE STUDY;  Surgeon: Little Ishikawa, MD;  Location: [facility] ENDOSCOPY;  Service: Cardiovascular;;   CESAREAN SECTION     CHOLECYSTECTOMY     CRANIOTOMY Right 07/06/2020   Procedure: RIGHT CRANIECTOMY WITH PLACEMENT OF BONE FLAP IN ABDOMEN;  Surgeon: Maeola Harman, MD;  Location: [facility] OR;  Service: Neurosurgery;  Laterality: Right;   CRANIOTOMY N/A 01/18/2021   Procedure: Replacement of right craniectomy skull flap from right abdomen;  Surgeon: Maeola Harman, MD;  Location: [facility] OR;  Service: Neurosurgery;  Laterality: N/A;   ESOPHAGOGASTRODUODENOSCOPY (EGD) WITH PROPOFOL N/A 07/26/2020   Procedure: ESOPHAGOGASTRODUODENOSCOPY (EGD) WITH PROPOFOL;  Surgeon: Violeta Gelinas, MD;  Location: [facility] ENDOSCOPY;  Service: General;  Laterality: N/A;   IR ANGIO INTRA EXTRACRAN SEL COM CAROTID INNOMINATE UNI L  MOD SED  07/04/2020   IR ANGIO VERTEBRAL SEL VERTEBRAL UNI R MOD SED  07/04/2020   IR CT HEAD LTD  07/04/2020   IR CT HEAD LTD  07/04/2020   IR PERCUTANEOUS ART THROMBECTOMY/INFUSION INTRACRANIAL INC DIAG ANGIO  07/04/2020   PEG PLACEMENT N/A 07/26/2020   Procedure: PERCUTANEOUS ENDOSCOPIC GASTROSTOMY (PEG) PLACEMENT;  Surgeon: Violeta Gelinas, MD;  Location: [facility] ENDOSCOPY;  Service: General;  Laterality: N/A;   PERIPHERAL VASCULAR BALLOON ANGIOPLASTY  09/13/2020   Procedure: PERIPHERAL VASCULAR BALLOON ANGIOPLASTY;  Surgeon: Cephus Shelling, MD;  Location: [facility] INVASIVE CV LAB;  Service: Cardiovascular;;  Radial    PULMONARY THROMBECTOMY Bilateral 09/18/2022   Procedure: PULMONARY THROMBECTOMY;  Surgeon: Renford Dills, MD;  Location: [facility] INVASIVE CV LAB;  Service: Cardiovascular;  Laterality: Bilateral;   RADIOLOGY WITH ANESTHESIA N/A 07/04/2020   Procedure: IR WITH ANESTHESIA;  Surgeon: Radiologist, Medication, MD;  Location: [facility] OR;  Service: Radiology;  Laterality: N/A;   TEE WITHOUT CARDIOVERSION N/A 07/19/2020   Procedure: TRANSESOPHAGEAL ECHOCARDIOGRAM (TEE);  Surgeon: Little Ishikawa, MD;  Location: [facility] ENDOSCOPY;  Service: Cardiovascular;  Laterality: N/A;   THROMBECTOMY BRACHIAL ARTERY Right 07/05/2020   Procedure: RIGHT UPPER EXTREMITY THROMBECTOMY;  Surgeon: Cephus Shelling, MD;  Location: [facility] OR;  Service: Vascular;  Laterality: Right;   UPPER EXTREMITY ANGIOGRAPHY N/A 09/13/2020   Procedure: UPPER EXTREMITY ANGIOGRAPHY - Right;  Surgeon: Cephus Shelling, MD;  Location: [facility] INVASIVE CV LAB;  Service: Cardiovascular;  Laterality: N/A;     Social History:   reports that she has been smoking cigarettes. She has been smoking an average of .25 packs per day. She has never used smokeless tobacco. She reports that she does not currently use alcohol. She reports that she does not use drugs.   Family History:  Her family history includes Diabetes in her maternal grandmother.    Allergies Allergies  Allergen Reactions   Norco [Hydrocodone-Acetaminophen] Itching, Nausea And Vomiting and Swelling     Home Medications  Prior to Admission medications   Medication Sig Start Date End Date Taking? Authorizing Provider  acetaminophen (TYLENOL) 325 MG tablet Take 2 tablets (650 mg total) by mouth every 4 (four) hours as needed for mild pain (or temp > 37.5 C (99.5 F)). 08/03/20   Russella Dar, NP  albuterol (VENTOLIN HFA) 108 (90 Base) MCG/ACT inhaler Inhale 1-2 puffs into the lungs every 6 (six) hours as needed for wheezing or shortness of breath.    [provider]  amoxicillin (AMOXIL) 500 MG capsule Take 1 capsule (500 mg total) by mouth 3 (three) times daily. Patient not taking: Reported on 07/12/2022 12/08/21   Menshew, Charlesetta Ivory, PA-C  aspirin EC 81 MG tablet Take 81 mg by mouth daily. Swallow whole.    [provider]  atorvastatin (LIPITOR) 40 MG tablet Place 1 tablet (40 mg total) into feeding tube daily at 6 PM. Patient taking differently: Take 40 mg by mouth daily. 08/03/20   Russella Dar, NP  BENZAMYCIN gel Apply 1 application topically in the morning and at bedtime. Patient not taking: Reported on 07/12/2022 12/26/20   [provider]  cyclobenzaprine (FLEXERIL) 10 MG tablet Take 10 mg by mouth every 8 (eight) hours as needed for muscle spasms. 06/14/22   [provider]  gabapentin (NEURONTIN) 300 MG capsule Take 300 mg by mouth 2 (two) times daily.    [provider]  insulin aspart (NOVOLOG) 100 UNIT/ML injection Inject 0-15 Units into the skin every 4 (four) hours. Correction coverage: Moderate (average weight, post-op)  CBG < 70: Implement Hypoglycemia Standing Orders and refer to Hypoglycemia Standing Orders sidebar report  CBG 70 - 120: 0 units  CBG 121 - 150: 2 units  CBG 151 - 200: 3 units  CBG 201 - 250: 5 units  CBG 251 - 300: 8 units  CBG 301 - 350: 11 units  CBG 351 - 400: 15 units  CBG >  400 call MD and obtain STAT lab verification Patient not taking: No sig reported 08/03/20   Russella Dar, NP  insulin aspart (NOVOLOG) 100 UNIT/ML injection Inject 6 Units into the skin every 4 (four) hours. Patient not taking: No sig reported 08/03/20   Russella Dar, NP  insulin detemir (LEVEMIR) 100 UNIT/ML injection Inject 0.35 mLs (35 Units total) into the skin 2 (two) times daily. Patient taking differently: Inject 40 Units into the skin daily. 08/03/20   Russella Dar, NP  melatonin 3 MG TABS tablet Place 1 tablet (3 mg total) into feeding tube at bedtime. Patient taking differently: Take 3 mg by mouth at bedtime. 08/03/20   Russella Dar, NP  metoprolol tartrate (LOPRESSOR) 50 MG tablet Place 1 tablet (50 mg total) into feeding tube 2 (two) times daily. Patient taking differently: Take 50 mg by mouth 2 (two) times daily. 08/03/20   Russella Dar, NP  Multiple Vitamin (MULTIVITAMIN WITH MINERALS) TABS tablet Place 1 tablet into feeding tube daily. Patient taking differently: Take 1 tablet by mouth daily. 08/04/20   Russella Dar, NP  RETIN-A 0.025 % cream Apply 1 application topically at bedtime. 12/20/20   [provider]  risperiDONE (RISPERDAL) 0.5 MG tablet Take 1 tablet (0.5 mg total) by mouth 2 (two) times daily as needed (agitation). 07/11/22 08/10/22  Charm Rings, NP  sertraline (ZOLOFT) 25 MG tablet Place 1 tablet (25 mg total) into feeding tube daily. Patient taking differently: Take 25 mg by mouth daily. 08/04/20   Russella Dar, NP  traMADol (ULTRAM) 50 MG tablet Take 1 tablet (50 mg total) by mouth 3 (three) times daily as needed. Patient not taking: Reported on 07/12/2022 12/08/21   Menshew, Charlesetta Ivory, PA-C  TRULICITY 0.75 MG/0.5ML SOPN Inject 0.75 mg into the skin every Saturday. Patient not taking: Reported on 07/12/2022 12/25/20   [provider]  valACYclovir (VALTREX) 1000 MG tablet Take 1,000 mg by mouth 2 (two) times daily. Patient  not taking:  Reported on 07/12/2022 05/27/22   [provider]  valproic acid (DEPAKENE) 250 MG capsule Take 250 mg by mouth 2 (two) times daily. Patient not taking: Reported on 07/12/2022    [provider]     Critical care time: 40 minutes     Harlon Ditty, AGACNP-BC [facility] Prefer epic messenger for cross cover needs If after hours, please call E-link

## 2022-09-19 NOTE — Consult Note (Signed)
ANTICOAGULATION CONSULT NOTE - Initial Consult  Pharmacy Consult for heparin infusion Indication: pulmonary embolus  Allergies  Allergen Reactions   Norco [Hydrocodone-Acetaminophen] Itching, Nausea And Vomiting and Swelling    Patient Measurements: Height: 5\' 5"  (165.1 cm) Weight: 93.5 kg (206 lb 2.1 oz) IBW/kg (Calculated) : 57 Heparin Dosing Weight: 81.2 kg  Vital Signs: Temp: 99.1 F (37.3 C) (06/06 1945) Temp Source: Oral (06/06 1945) BP: 106/68 (06/06 2200) Pulse Rate: 102 (06/06 2200)  Labs: Recent Labs    09/18/22 0052 09/18/22 0317 09/18/22 0805 09/18/22 1030 09/19/22 0304  HGB 8.4*  --   --   --   --   HCT 31.9*  --   --   --   --   PLT 232  --   --   --   --   APTT  --   --   --  35  --   LABPROT  --   --   --  15.2  --   INR  --   --   --  1.2  --   HEPARINUNFRC  --   --   --  <0.10* 0.10*  CREATININE 0.78  --   --   --   --   TROPONINIHS 10 10 15   --   --      Estimated Creatinine Clearance: 108.8 mL/min (by C-G formula based on SCr of 0.78 mg/dL).   Medical History: Past Medical History:  Diagnosis Date   Asthma    Diabetes mellitus without complication (HCC)    Hypertension     Medications:  No current anticoagulation per pharmacist review.  Assessment: 37 yo female presented to the ED with chief complaint of shortness of breath and cough. D-dimer found to be elevated and chest CT showed saddle pulmonary embolus.  Pharmacy consulted to start heparin infusion.  Goal of Therapy:  Heparin level 0.3-0.7 units/ml Monitor platelets by anticoagulation protocol: Yes   Plan:  6/7:  HL @ 0304 = 0.10, SUBtherapeutic  - Will order heparin 2400 units IV X 1 bolus and increase drip rate to 1450 units/hr. - Will recheck HL 6 hrs after rate change   Antion Andres D, PharmD 09/19/2022,3:45 AM

## 2022-09-19 NOTE — Consult Note (Addendum)
ANTICOAGULATION CONSULT NOTE - Initial Consult  Pharmacy Consult for heparin infusion Indication: pulmonary embolus  Allergies  Allergen Reactions   Norco [Hydrocodone-Acetaminophen] Itching, Nausea And Vomiting and Swelling    Patient Measurements: Height: 5\' 5"  (165.1 cm) Weight: 93.5 kg (206 lb 2.1 oz) IBW/kg (Calculated) : 57 Heparin Dosing Weight: 81.2 kg  Vital Signs: Temp: 99.1 F (37.3 C) (06/06 1945) Temp Source: Oral (06/06 1945) BP: 106/68 (06/06 2200) Pulse Rate: 102 (06/06 2200)  Labs: Recent Labs    09/18/22 0052 09/18/22 0317 09/18/22 0805 09/18/22 1030 09/19/22 0304  HGB 8.4*  --   --   --   --   HCT 31.9*  --   --   --   --   PLT 232  --   --   --   --   APTT  --   --   --  35  --   LABPROT  --   --   --  15.2  --   INR  --   --   --  1.2  --   HEPARINUNFRC  --   --   --  <0.10* 0.10*  CREATININE 0.78  --   --   --   --   TROPONINIHS 10 10 15   --   --      Estimated Creatinine Clearance: 108.8 mL/min (by C-G formula based on SCr of 0.78 mg/dL).   Medical History: Past Medical History:  Diagnosis Date   Asthma    Diabetes mellitus without complication (HCC)    Hypertension     Medications:  No current anticoagulation per pharmacist review.  Assessment: 37 yo female presented to the ED with chief complaint of shortness of breath and cough. D-dimer found to be elevated and chest CT showed saddle pulmonary embolus.  Pharmacy consulted to start heparin infusion.  Goal of Therapy:  Heparin level 0.3-0.7 units/ml Monitor platelets by anticoagulation protocol: Yes   Plan:  6/7:  0304 = 0.10, SUBtherapeutic - RN says there have not been any interruptions in heparin infusion - Will order heparin 2400 units IV X 1 bolus and increase drip rate to 1450 units/hr - Will recheck HL 6 hrs after rate change  6/7:   RN notified pharmacy Hgb = 6.7 @ 0503  - heparin gtt now on hold per NP, will need to f/u plans for anticoag later on  6/7  Jaksen Fiorella D, PharmD 09/19/2022,4:07 AM

## 2022-09-20 DIAGNOSIS — D62 Acute posthemorrhagic anemia: Secondary | ICD-10-CM | POA: Diagnosis not present

## 2022-09-20 DIAGNOSIS — I2602 Saddle embolus of pulmonary artery with acute cor pulmonale: Secondary | ICD-10-CM | POA: Diagnosis not present

## 2022-09-20 DIAGNOSIS — E272 Addisonian crisis: Secondary | ICD-10-CM | POA: Diagnosis not present

## 2022-09-20 DIAGNOSIS — I6930 Unspecified sequelae of cerebral infarction: Secondary | ICD-10-CM

## 2022-09-20 DIAGNOSIS — E669 Obesity, unspecified: Secondary | ICD-10-CM | POA: Insufficient documentation

## 2022-09-20 DIAGNOSIS — I82403 Acute embolism and thrombosis of unspecified deep veins of lower extremity, bilateral: Secondary | ICD-10-CM | POA: Insufficient documentation

## 2022-09-20 DIAGNOSIS — E1165 Type 2 diabetes mellitus with hyperglycemia: Secondary | ICD-10-CM | POA: Insufficient documentation

## 2022-09-20 DIAGNOSIS — D508 Other iron deficiency anemias: Secondary | ICD-10-CM | POA: Diagnosis not present

## 2022-09-20 LAB — GLUCOSE, CAPILLARY
Glucose-Capillary: 167 mg/dL — ABNORMAL HIGH (ref 70–99)
Glucose-Capillary: 141 mg/dL — ABNORMAL HIGH (ref 70–99)
Glucose-Capillary: 220 mg/dL — ABNORMAL HIGH (ref 70–99)
Glucose-Capillary: 258 mg/dL — ABNORMAL HIGH (ref 70–99)

## 2022-09-20 LAB — URINE DRUG SCREEN, QUALITATIVE (ARMC ONLY)
Amphetamines, Ur Screen: NOT DETECTED
Barbiturates, Ur Screen: NOT DETECTED
Benzodiazepine, Ur Scrn: POSITIVE — AB
Cannabinoid 50 Ng, Ur ~~LOC~~: POSITIVE — AB
Cocaine Metabolite,Ur ~~LOC~~: NOT DETECTED
MDMA (Ecstasy)Ur Screen: NOT DETECTED
Methadone Scn, Ur: NOT DETECTED
Opiate, Ur Screen: NOT DETECTED
Phencyclidine (PCP) Ur S: NOT DETECTED
Tricyclic, Ur Screen: NOT DETECTED

## 2022-09-20 LAB — CBC
HCT: 26.3 % — ABNORMAL LOW (ref 36.0–46.0)
Hemoglobin: 7.1 g/dL — ABNORMAL LOW (ref 12.0–15.0)
MCH: 19.2 pg — ABNORMAL LOW (ref 26.0–34.0)
MCHC: 27 g/dL — ABNORMAL LOW (ref 30.0–36.0)
MCV: 71.1 fL — ABNORMAL LOW (ref 80.0–100.0)
Platelets: 217 10*3/uL (ref 150–400)
RBC: 3.7 MIL/uL — ABNORMAL LOW (ref 3.87–5.11)
RDW: 22.8 % — ABNORMAL HIGH (ref 11.5–15.5)
WBC: 7.6 10*3/uL (ref 4.0–10.5)
nRBC: 0.3 % — ABNORMAL HIGH (ref 0.0–0.2)

## 2022-09-20 LAB — URINALYSIS, COMPLETE (UACMP) WITH MICROSCOPIC
Bacteria, UA: NONE SEEN
RBC / HPF: >50 RBC/hpf (ref 0–5)
Specific Gravity, Urine: 1.018 (ref 1.005–1.030)
WBC, UA: >50 WBC/hpf (ref 0–5)

## 2022-09-20 LAB — HEPARIN LEVEL (UNFRACTIONATED)
Heparin Unfractionated: 0.37 IU/mL (ref 0.30–0.70)
Heparin Unfractionated: 0.38 IU/mL (ref 0.30–0.70)

## 2022-09-20 LAB — TYPE AND SCREEN
ABO/RH(D): O POS
Unit division: 0

## 2022-09-20 LAB — HEMOGLOBIN
Hemoglobin: 7.2 g/dL — ABNORMAL LOW (ref 12.0–15.0)
Hemoglobin: 7.6 g/dL — ABNORMAL LOW (ref 12.0–15.0)

## 2022-09-20 LAB — RENAL FUNCTION PANEL
Albumin: 2.5 g/dL — ABNORMAL LOW (ref 3.5–5.0)
Anion gap: 6 (ref 5–15)
BUN: <5 mg/dL — ABNORMAL LOW (ref 6–20)
CO2: 26 mmol/L (ref 22–32)
Calcium: 7.8 mg/dL — ABNORMAL LOW (ref 8.9–10.3)
Chloride: 104 mmol/L (ref 98–111)
Creatinine, Ser: 0.48 mg/dL (ref 0.44–1.00)
GFR, Estimated: >60 mL/min (ref >60)
Glucose, Bld: 161 mg/dL — ABNORMAL HIGH (ref 70–99)
Phosphorus: 2.8 mg/dL (ref 2.5–4.6)
Potassium: 3.5 mmol/L (ref 3.5–5.1)
Sodium: 136 mmol/L (ref 135–145)

## 2022-09-20 LAB — MRSA NEXT GEN BY PCR, NASAL: MRSA by PCR Next Gen: NOT DETECTED

## 2022-09-20 LAB — BPAM RBC
Blood Product Expiration Date: 202407112359
ISSUE DATE / TIME: 202406071024

## 2022-09-20 LAB — CORTISOL-AM, BLOOD: Cortisol - AM: >100 ug/dL — ABNORMAL HIGH (ref 6.7–22.6)

## 2022-09-20 MED ORDER — CYCLOBENZAPRINE HCL 10 MG PO TABS
10.0000 mg | ORAL_TABLET | Freq: Three times a day (TID) | ORAL | Status: DC | PRN
  Administered 2022-09-21 – 2022-10-05 (×20): 10 mg via ORAL
  Filled 2022-09-20 (×21): qty 1

## 2022-09-20 MED ORDER — SODIUM CHLORIDE 0.9 % IV SOLN
300.0000 mg | Freq: Once | INTRAVENOUS | Status: AC
  Administered 2022-09-20: 300 mg via INTRAVENOUS
  Filled 2022-09-20: qty 300

## 2022-09-20 MED ORDER — POTASSIUM CHLORIDE CRYS ER 20 MEQ PO TBCR
40.0000 meq | EXTENDED_RELEASE_TABLET | Freq: Once | ORAL | Status: AC
  Administered 2022-09-20: 40 meq via ORAL
  Filled 2022-09-20: qty 2

## 2022-09-20 MED ORDER — INSULIN ASPART 100 UNIT/ML IJ SOLN
0.0000 [IU] | Freq: Three times a day (TID) | INTRAMUSCULAR | Status: DC
  Administered 2022-09-20: 5 [IU] via SUBCUTANEOUS
  Administered 2022-09-20: 2 [IU] via SUBCUTANEOUS
  Administered 2022-09-20: 8 [IU] via SUBCUTANEOUS
  Administered 2022-09-20: 3 [IU] via SUBCUTANEOUS
  Administered 2022-09-21: 8 [IU] via SUBCUTANEOUS
  Administered 2022-09-21: 11 [IU] via SUBCUTANEOUS
  Administered 2022-09-21 (×2): 8 [IU] via SUBCUTANEOUS
  Administered 2022-09-22: 11 [IU] via SUBCUTANEOUS
  Administered 2022-09-22 (×2): 8 [IU] via SUBCUTANEOUS
  Administered 2022-09-22: 3 [IU] via SUBCUTANEOUS
  Administered 2022-09-23: 5 [IU] via SUBCUTANEOUS
  Administered 2022-09-23 (×2): 11 [IU] via SUBCUTANEOUS
  Administered 2022-09-23: 8 [IU] via SUBCUTANEOUS
  Administered 2022-09-24: 2 [IU] via SUBCUTANEOUS
  Administered 2022-09-24 (×3): 5 [IU] via SUBCUTANEOUS
  Filled 2022-09-20 (×20): qty 1

## 2022-09-20 MED ORDER — MELATONIN 5 MG PO TABS
2.5000 mg | ORAL_TABLET | Freq: Every day | ORAL | Status: DC
  Administered 2022-09-20 – 2022-09-27 (×8): 2.5 mg
  Filled 2022-09-20 (×8): qty 1

## 2022-09-20 MED ORDER — OXYCODONE-ACETAMINOPHEN 5-325 MG PO TABS
1.0000 | ORAL_TABLET | ORAL | Status: DC | PRN
  Administered 2022-09-20 – 2022-09-28 (×22): 1 via ORAL
  Filled 2022-09-20 (×22): qty 1

## 2022-09-20 MED ORDER — SODIUM CHLORIDE 0.9 % IV BOLUS
500.0000 mL | Freq: Once | INTRAVENOUS | Status: AC
  Administered 2022-09-20: 500 mL via INTRAVENOUS

## 2022-09-20 MED ORDER — ATORVASTATIN CALCIUM 20 MG PO TABS
40.0000 mg | ORAL_TABLET | Freq: Every day | ORAL | Status: DC
  Administered 2022-09-20 – 2022-10-06 (×17): 40 mg via ORAL
  Filled 2022-09-20 (×17): qty 2

## 2022-09-20 MED ORDER — ALBUTEROL SULFATE (2.5 MG/3ML) 0.083% IN NEBU
3.0000 mL | INHALATION_SOLUTION | Freq: Four times a day (QID) | RESPIRATORY_TRACT | Status: DC | PRN
  Administered 2022-09-20 – 2022-09-22 (×2): 3 mL via RESPIRATORY_TRACT
  Filled 2022-09-20 (×2): qty 3

## 2022-09-20 MED ORDER — HYDROCORTISONE SOD SUC (PF) 100 MG IJ SOLR
100.0000 mg | Freq: Three times a day (TID) | INTRAMUSCULAR | Status: DC
  Administered 2022-09-20 – 2022-09-22 (×6): 100 mg via INTRAVENOUS
  Filled 2022-09-20 (×8): qty 2

## 2022-09-20 NOTE — Progress Notes (Signed)
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  CT scan showed saddle pulmonary emboli, patient was seen by vascular surgery, emergent thrombectomy was performed on 6/6.  Patient also had a significant hypotension, required Levophed in ICU. Echocardiogram showed normal ejection fraction, with increased pulmonary pressure consistent with acute cor pulmonale.  Duplex ultrasound showed bilateral lower extremity DVT.   Active Problems:   Benign essential hypertension   Adjustment disorder with mixed disturbance of emotions and conduct   Acute saddle pulmonary embolism with acute cor pulmonale (HCC)   Iron deficiency anemia   Obstructive cardiovascular shock (HCC)   Adrenal crisis (HCC)   Acute blood loss anemia   Obesity (BMI 30.0-34.9)   Uncontrolled type 2 diabetes mellitus with hyperglycemia (HCC)   History of stroke with current residual effects   Leg DVT (deep venous thromboembolism), acute, bilateral (HCC)   Assessment and Plan: Saddle pulmonary emboli with acute cor pulmonale. Obstructive cardiogenic shock. Bilateral lower extremity DVT. Patient is status post pulmonary thrombectomy.  Condition seem to be improving, still on heparin drip.  Given her ongoing vaginal bleeding, I will continue heparin drip, likely transition to oral Eliquis tomorrow.  Hypotension. Likely adrenal crisis. Patient states that she was on steroids long-term, recently discontinued.  Her blood pressure was low normally, but more significant in the hospital.  She most likely has adrenal crisis, will give  stress dose steroids, continue monitor blood pressure.  She also received 500 mm of fluid bolus for low blood pressure.  Acute blood loss anemia secondary to vaginal bleeding. Iron deficient anemia. Patient had received a unit of blood transfusion with admission, will give IV iron.  Continue monitor hemoglobin.  Type 2 diabetes. Continue sliding scale insulin  History of stroke with extremity weakness. Continue home medicines,  Obesity.  BMI 32.17. Diet and exercise advised.    Subjective:  Patient blood pressure is low, but she does not have any dizziness. Denies any short of breath.  Physical Exam: Vitals:   09/20/22 0833 09/20/22 0900 09/20/22 1020 09/20/22 1030  BP: (!) 92/56 (!) 84/57 (!) 95/57 99/68  Pulse: 72 86 78 79  Resp: 20 (!) 22 (!) 22 20  Temp:      TempSrc:      SpO2: 100% 100% 96% 96%  Weight:      Height:       General exam: Appears calm and comfortable  Respiratory system: Clear to auscultation. Respiratory effort normal. Cardiovascular system: S1 & S2 heard, RRR. No JVD, murmurs, rubs, gallops or clicks. Gastrointestinal system: Abdomen is nondistended, soft and nontender. No organomegaly or masses felt. Normal bowel sounds heard. Central nervous system: Alert and oriented x3. No focal neurological deficits. Extremities: Symmetric 5 x 5 power. Skin: No rashes, lesions or ulcers Psychiatry: Judgement and insight appear normal. Mood & affect appropriate.    Data Reviewed:  Reviewed CT angiogram results, lab results.  Also reviewed echocardiogram results.  Family Communication: None  Disposition: Status is: Inpatient Remains inpatient appropriate because: Severity of disease, IV treatment.     Time spent: 50 minutes  Author: Freda Munro  Chipper Herb, MD 09/20/2022 10:49 AM  For on call review [email or web address].

## 2022-09-20 NOTE — Consult Note (Signed)
ANTICOAGULATION CONSULT NOTE  Pharmacy Consult for Heparin Infusion Indication: pulmonary embolus  Allergies  Allergen Reactions   Norco [Hydrocodone-Acetaminophen] Itching, Nausea And Vomiting and Swelling    Patient Measurements: Height: 5\' 5"  (165.1 cm) Weight: 93.5 kg (206 lb 2.1 oz) IBW/kg (Calculated) : 57 Heparin Dosing Weight: 81.2 kg  Vital Signs: Temp: 99.1 F (37.3 C) (06/07 1600) Temp Source: Oral (06/07 1600) BP: 98/70 (06/07 2200) Pulse Rate: 81 (06/07 2200)  Labs: Recent Labs    09/18/22 0052 09/18/22 0317 09/18/22 0805 09/18/22 1030 09/18/22 1030 09/19/22 0304 09/19/22 0503 09/19/22 1558 09/19/22 2311  HGB 8.4*  --   --   --   --   --  6.7* 8.0*  --   HCT 31.9*  --   --   --   --   --  25.7* 29.0*  --   PLT 232  --   --   --   --   --  221  --   --   APTT  --   --   --  35  --   --   --   --   --   LABPROT  --   --   --  15.2  --   --   --   --   --   INR  --   --   --  1.2  --   --   --   --   --   HEPARINUNFRC  --   --   --  <0.10*   < > 0.10*  --  0.14* 0.17*  CREATININE 0.78  --   --   --   --   --  0.58  --   --   TROPONINIHS 10 10 15   --   --   --   --   --   --    < > = values in this interval not displayed.     Estimated Creatinine Clearance: 108.8 mL/min (by C-G formula based on SCr of 0.58 mg/dL).   Medical History: Past Medical History:  Diagnosis Date   Asthma    Diabetes mellitus without complication (HCC)    Hypertension     Medications:  Scheduled:   Chlorhexidine Gluconate Cloth  6 each Topical Daily   gabapentin  200 mg Oral BID   insulin aspart  0-15 Units Subcutaneous Q4H   Infusions:   sodium chloride Stopped (09/18/22 1155)   heparin 1,600 Units/hr (09/19/22 1900)   norepinephrine (LEVOPHED) Adult infusion Stopped (09/19/22 1815)   PRN: docusate sodium, fentaNYL (SUBLIMAZE) injection, ibuprofen, iodixanol, polyethylene glycol, traMADol  Assessment: Brandi Myers is a 37 y.o. female presenting with SOB and  cough. PMH significant for T2DM, HTN. Patient was not on [name] PTA per chart review. Myers-dimer found to be elevated and chest CT showed massive/submassive saddle pulmonary embolus. Heparin infusion was held due to Hgb <7. Suspected cause of Hgb drop is heavy menses. Per MD, will resume infusion due to clot burden (saddle PE and VTE) and transfuse as needed. Mechanical thrombectomy performed 09/18/2022. Pharmacy has been consulted to initiate and manage heparin infusion.   Baseline Labs: aPTT 35, HL <0.10, PT 15.2, INR 1.2, Hgb 8.4, Hct 31.9, Plt 232   Goal of Therapy:  Heparin level 0.3-0.7 units/ml Monitor platelets by anticoagulation protocol: Yes  Date Time HL Rate/Comment  6/7 0304 0.10 1300/subtherapeutic 6/7 0503 --- 0/hold infusion for Hgb of 6.7 6/7 0944 --- 1400/resume infusion per  MD 6/7 1558 0.14 1400/subtherapeutic 6/7       2311    0.17     1600/SUBtherapeutic   Plan:  No boluses per MD Increase heparin drip rate to 1900 units/hr  Check HL in 6 hours  Continue to monitor H&H and platelets daily while on heparin infusion   Brandi Myers 09/20/2022 12:06 AM

## 2022-09-20 NOTE — Consult Note (Signed)
ANTICOAGULATION CONSULT NOTE  Pharmacy Consult for Heparin Infusion Indication: pulmonary embolus  Allergies  Allergen Reactions   Norco [Hydrocodone-Acetaminophen] Itching, Nausea And Vomiting and Swelling    Patient Measurements: Height: 5\' 5"  (165.1 cm) Weight: 93.5 kg (206 lb 2.1 oz) IBW/kg (Calculated) : 57 Heparin Dosing Weight: 81.2 kg  Vital Signs: Temp: 98.2 F (36.8 C) (06/08 0400) Temp Source: Oral (06/08 0400) BP: 79/48 (06/08 0500) Pulse Rate: 77 (06/08 0500)  Labs: Recent Labs    09/18/22 0052 09/18/22 0317 09/18/22 0805 09/18/22 1030 09/19/22 0304 09/19/22 0503 09/19/22 1558 09/19/22 2311 09/20/22 0651  HGB 8.4*  --   --   --   --  6.7* 8.0*  --  7.1*  HCT 31.9*  --   --   --   --  25.7* 29.0*  --  26.3*  PLT 232  --   --   --   --  221  --   --  217  APTT  --   --   --  35  --   --   --   --   --   LABPROT  --   --   --  15.2  --   --   --   --   --   INR  --   --   --  1.2  --   --   --   --   --   HEPARINUNFRC  --   --   --  <0.10*   < >  --  0.14* 0.17* 0.37  CREATININE 0.78  --   --   --   --  0.58  --   --  0.48  TROPONINIHS 10 10 15   --   --   --   --   --   --    < > = values in this interval not displayed.     Estimated Creatinine Clearance: 108.8 mL/min (by C-G formula based on SCr of 0.48 mg/dL).   Medical History: Past Medical History:  Diagnosis Date   Asthma    Diabetes mellitus without complication (HCC)    Hypertension     Medications:  Scheduled:   Chlorhexidine Gluconate Cloth  6 each Topical Daily   gabapentin  200 mg Oral BID   insulin aspart  0-15 Units Subcutaneous TID AC & HS   Infusions:   sodium chloride Stopped (09/18/22 1155)   heparin 1,900 Units/hr (09/20/22 1191)   norepinephrine (LEVOPHED) Adult infusion Stopped (09/19/22 1815)   PRN: docusate sodium, fentaNYL (SUBLIMAZE) injection, ibuprofen, iodixanol, polyethylene glycol, traMADol  Assessment: Brandi Myers is a 37 y.o. female presenting with  SOB and cough. PMH significant for T2DM, HTN. Patient was not on [facility] PTA per chart review. D-dimer found to be elevated and chest CT showed massive/submassive saddle pulmonary embolus. Heparin infusion was held due to Hgb <7. Suspected cause of Hgb drop is heavy menses. Per MD, will resume infusion due to clot burden (saddle PE and VTE) and transfuse as needed. Mechanical thrombectomy performed 09/18/2022. Pharmacy has been consulted to initiate and manage heparin infusion.   Baseline Labs: aPTT 35, HL <0.10, PT 15.2, INR 1.2, Hgb 8.4, Hct 31.9, Plt 232   Goal of Therapy:  Heparin level 0.3-0.7 units/ml Monitor platelets by anticoagulation protocol: Yes  Date Time HL Rate/Comment  6/7 0304 0.10 1300/subtherapeutic 6/7 0503 --- 0/hold infusion for Hgb of 6.7 6/7 0944 --- 1400/resume infusion per MD 6/7 1558 0.14 1400/subtherapeutic  6/7       2311    0.17     1600/SUBtherapeutic  6/8 0651 0.37 1900/therapeutic x1  Plan:  No boluses per MD Continue heparin drip rate at 1900 units/hr  Check confirmatory HL in 6 hours  Continue to monitor H&H and platelets daily while on heparin infusion   Bettey Costa, PharmD Clinical Pharmacist 09/20/2022 7:40 AM

## 2022-09-20 NOTE — Consult Note (Signed)
ANTICOAGULATION CONSULT NOTE  Pharmacy Consult for Heparin Infusion Indication: pulmonary embolus  Allergies  Allergen Reactions   Norco [Hydrocodone-Acetaminophen] Itching, Nausea And Vomiting and Swelling    Patient Measurements: Height: 5\' 5"  (165.1 cm) Weight: 87.7 kg (193 lb 5.5 oz) IBW/kg (Calculated) : 57 Heparin Dosing Weight: 81.2 kg  Vital Signs: Temp: 99.3 F (37.4 C) (06/08 1200) Temp Source: Oral (06/08 1200) BP: 100/81 (06/08 1320) Pulse Rate: 72 (06/08 1320)  Labs: Recent Labs    09/18/22 0052 09/18/22 0317 09/18/22 0805 09/18/22 1030 09/19/22 0304 09/19/22 0503 09/19/22 1558 09/19/22 2311 09/20/22 0651 09/20/22 0945 09/20/22 1224  HGB 8.4*  --   --   --   --  6.7* 8.0*  --  7.1* 7.2*  --   HCT 31.9*  --   --   --   --  25.7* 29.0*  --  26.3*  --   --   PLT 232  --   --   --   --  221  --   --  217  --   --   APTT  --   --   --  35  --   --   --   --   --   --   --   LABPROT  --   --   --  15.2  --   --   --   --   --   --   --   INR  --   --   --  1.2  --   --   --   --   --   --   --   HEPARINUNFRC  --   --   --  <0.10*   < >  --  0.14* 0.17* 0.37  --  0.38  CREATININE 0.78  --   --   --   --  0.58  --   --  0.48  --   --   TROPONINIHS 10 10 15   --   --   --   --   --   --   --   --    < > = values in this interval not displayed.     Estimated Creatinine Clearance: 105.3 mL/min (by C-G formula based on SCr of 0.48 mg/dL).   Medical History: Past Medical History:  Diagnosis Date   Asthma    Diabetes mellitus without complication (HCC)    Hypertension     Medications:  Scheduled:   atorvastatin  40 mg Oral Daily   Chlorhexidine Gluconate Cloth  6 each Topical Daily   gabapentin  200 mg Oral BID   hydrocortisone sod succinate (SOLU-CORTEF) inj  100 mg Intravenous Q8H   insulin aspart  0-15 Units Subcutaneous TID AC & HS   melatonin  2.5 mg Per Tube QHS   Infusions:   sodium chloride Stopped (09/18/22 1155)   heparin 1,900 Units/hr  (09/20/22 1251)   norepinephrine (LEVOPHED) Adult infusion Stopped (09/19/22 1815)   PRN: albuterol, cyclobenzaprine, docusate sodium, ibuprofen, iodixanol, oxyCODONE-acetaminophen, polyethylene glycol  Assessment: Brandi Myers is a 37 y.o. female presenting with SOB and cough. PMH significant for T2DM, HTN. Patient was not on [facility] PTA per chart review. D-dimer found to be elevated and chest CT showed massive/submassive saddle pulmonary embolus. Heparin infusion was held due to Hgb <7. Suspected cause of Hgb drop is heavy menses. Per MD, will resume infusion due to clot burden (saddle PE and VTE)  and transfuse as needed. Mechanical thrombectomy performed 09/18/2022. Pharmacy has been consulted to initiate and manage heparin infusion.   Baseline Labs: aPTT 35, HL <0.10, PT 15.2, INR 1.2, Hgb 8.4, Hct 31.9, Plt 232   Goal of Therapy:  Heparin level 0.3-0.7 units/ml Monitor platelets by anticoagulation protocol: Yes  Date Time HL Rate/Comment  6/7 0304 0.10 1300/subtherapeutic 6/7 0503 --- 0/hold infusion for Hgb of 6.7 6/7 0944 --- 1400/resume infusion per MD 6/7 1558 0.14 1400/subtherapeutic 6/7       2311    0.17     1600/SUBtherapeutic  6/8 0651 0.37 1900/therapeutic x1 6/8 1224 0.38 1900/therapeutic x2  Plan:  No boluses per MD Continue heparin drip rate at 1900 units/hr  Check HL with AM labs Continue to monitor H&H and platelets daily while on heparin infusion   Bettey Costa, PharmD Clinical Pharmacist 09/20/2022 1:36 PM

## 2022-09-20 NOTE — Hospital Course (Addendum)
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  CT scan showed saddle pulmonary emboli, patient was seen by vascular surgery, emergent thrombectomy was performed on 6/6.  Patient also had a significant hypotension, required Levophed in ICU. Echocardiogram showed normal ejection fraction, with increased pulmonary pressure consistent with acute cor pulmonale.  Duplex ultrasound showed bilateral lower extremity DVT. 6/8. Patient had a persistent hypotension, she was placed on hydrocortisone stress dose for adrenal crisis, blood pressure is better. Patient also developed acute hypoxemia, started bronchodilator. 6/9.  Give her lower dose IV Lasix, transferred to regular medical floor. Anticoagulation changed to Eliquis. Patient condition has improved, steroids have reduced to 2.5 mg daily 6/14, blood pressure more stable.  Patient ready for discharge, but no place to go.

## 2022-09-20 NOTE — Progress Notes (Signed)
Called into room by patient, coughed up clear mucus, sat 80 on 3l. Increased to 4l repositioned. Per patient no increased sob. Spoke with Lucille Passy from respiratory to assess patient. RT placed patient on HFNC 10L Dr. Chipper Herb was made aware. Order to transfer cancelled. No other new order  no chest xray at this time reordered.

## 2022-09-21 ENCOUNTER — Inpatient Hospital Stay: Payer: Medicaid Other

## 2022-09-21 DIAGNOSIS — J9601 Acute respiratory failure with hypoxia: Secondary | ICD-10-CM | POA: Diagnosis not present

## 2022-09-21 DIAGNOSIS — I2602 Saddle embolus of pulmonary artery with acute cor pulmonale: Secondary | ICD-10-CM | POA: Diagnosis not present

## 2022-09-21 DIAGNOSIS — D62 Acute posthemorrhagic anemia: Secondary | ICD-10-CM | POA: Diagnosis not present

## 2022-09-21 LAB — RENAL FUNCTION PANEL
Albumin: 2.8 g/dL — ABNORMAL LOW (ref 3.5–5.0)
Anion gap: 7 (ref 5–15)
BUN: <5 mg/dL — ABNORMAL LOW (ref 6–20)
CO2: 25 mmol/L (ref 22–32)
Calcium: 7.8 mg/dL — ABNORMAL LOW (ref 8.9–10.3)
Chloride: 105 mmol/L (ref 98–111)
Creatinine, Ser: 0.49 mg/dL (ref 0.44–1.00)
GFR, Estimated: >60 mL/min (ref >60)
Glucose, Bld: 254 mg/dL — ABNORMAL HIGH (ref 70–99)
Phosphorus: 3.3 mg/dL (ref 2.5–4.6)
Potassium: 4.1 mmol/L (ref 3.5–5.1)
Sodium: 137 mmol/L (ref 135–145)

## 2022-09-21 LAB — CBC
HCT: 28.2 % — ABNORMAL LOW (ref 36.0–46.0)
Hemoglobin: 7.8 g/dL — ABNORMAL LOW (ref 12.0–15.0)
MCH: 19.6 pg — ABNORMAL LOW (ref 26.0–34.0)
MCHC: 27.7 g/dL — ABNORMAL LOW (ref 30.0–36.0)
MCV: 71 fL — ABNORMAL LOW (ref 80.0–100.0)
Platelets: 255 10*3/uL (ref 150–400)
RBC: 3.97 MIL/uL (ref 3.87–5.11)
RDW: 23 % — ABNORMAL HIGH (ref 11.5–15.5)
WBC: 9.3 10*3/uL (ref 4.0–10.5)
nRBC: 0.3 % — ABNORMAL HIGH (ref 0.0–0.2)

## 2022-09-21 LAB — HEMOGLOBIN
Hemoglobin: 7.8 g/dL — ABNORMAL LOW (ref 12.0–15.0)
Hemoglobin: 7.4 g/dL — ABNORMAL LOW (ref 12.0–15.0)
Hemoglobin: 7.8 g/dL — ABNORMAL LOW (ref 12.0–15.0)

## 2022-09-21 LAB — GLUCOSE, CAPILLARY
Glucose-Capillary: 295 mg/dL — ABNORMAL HIGH (ref 70–99)
Glucose-Capillary: 297 mg/dL — ABNORMAL HIGH (ref 70–99)
Glucose-Capillary: 306 mg/dL — ABNORMAL HIGH (ref 70–99)
Glucose-Capillary: 282 mg/dL — ABNORMAL HIGH (ref 70–99)

## 2022-09-21 LAB — ABO/RH: ABO/RH(D): O POS

## 2022-09-21 LAB — BRAIN NATRIURETIC PEPTIDE: B Natriuretic Peptide: 353.3 pg/mL — ABNORMAL HIGH (ref 0.0–100.0)

## 2022-09-21 LAB — MAGNESIUM: Magnesium: 1.9 mg/dL (ref 1.7–2.4)

## 2022-09-21 LAB — PROCALCITONIN: Procalcitonin: <0.1 ng/mL

## 2022-09-21 LAB — VITAMIN B12: Vitamin B-12: 250 pg/mL (ref 180–914)

## 2022-09-21 LAB — HEPARIN LEVEL (UNFRACTIONATED): Heparin Unfractionated: 0.61 IU/mL (ref 0.30–0.70)

## 2022-09-21 MED ORDER — APIXABAN 5 MG PO TABS
5.0000 mg | ORAL_TABLET | Freq: Two times a day (BID) | ORAL | Status: DC
  Administered 2022-09-28 – 2022-10-06 (×17): 5 mg via ORAL
  Filled 2022-09-21 (×17): qty 1

## 2022-09-21 MED ORDER — FUROSEMIDE 10 MG/ML IJ SOLN
20.0000 mg | Freq: Once | INTRAMUSCULAR | Status: AC
  Administered 2022-09-21: 20 mg via INTRAVENOUS
  Filled 2022-09-21: qty 2

## 2022-09-21 MED ORDER — CYANOCOBALAMIN 1000 MCG/ML IJ SOLN
1000.0000 ug | Freq: Once | INTRAMUSCULAR | Status: AC
  Administered 2022-09-21: 1000 ug via INTRAMUSCULAR
  Filled 2022-09-21: qty 1

## 2022-09-21 MED ORDER — APIXABAN 5 MG PO TABS
10.0000 mg | ORAL_TABLET | Freq: Two times a day (BID) | ORAL | Status: AC
  Administered 2022-09-21 – 2022-09-27 (×14): 10 mg via ORAL
  Filled 2022-09-21 (×14): qty 2

## 2022-09-21 NOTE — Consult Note (Signed)
ANTICOAGULATION CONSULT NOTE  Pharmacy Consult for Heparin Infusion Indication: pulmonary embolus  Allergies  Allergen Reactions   Norco [Hydrocodone-Acetaminophen] Itching, Nausea And Vomiting and Swelling    Patient Measurements: Height: 5\' 5"  (165.1 cm) Weight: 86.7 kg (191 lb 2.2 oz) IBW/kg (Calculated) : 57 Heparin Dosing Weight: 81.2 kg  Vital Signs: Temp: 98.1 F (36.7 C) (06/09 0430) Temp Source: Oral (06/09 0430) BP: 111/77 (06/09 0500) Pulse Rate: 87 (06/09 0522)  Labs: Recent Labs    09/18/22 0805 09/18/22 1030 09/19/22 0304 09/19/22 0503 09/19/22 1558 09/19/22 2311 09/20/22 0651 09/20/22 0945 09/20/22 1224 09/20/22 1751 09/21/22 0124 09/21/22 0508  HGB  --   --    < > 6.7* 8.0*  --  7.1*   < >  --    < > 7.8* 7.8*  HCT  --   --    < > 25.7* 29.0*  --  26.3*  --   --   --   --  28.2*  PLT  --   --   --  221  --   --  217  --   --   --   --  255  APTT  --  35  --   --   --   --   --   --   --   --   --   --   LABPROT  --  15.2  --   --   --   --   --   --   --   --   --   --   INR  --  1.2  --   --   --   --   --   --   --   --   --   --   HEPARINUNFRC  --  <0.10*   < >  --  0.14*   < > 0.37  --  0.38  --   --  0.61  CREATININE  --   --   --  0.58  --   --  0.48  --   --   --   --  0.49  TROPONINIHS 15  --   --   --   --   --   --   --   --   --   --   --    < > = values in this interval not displayed.     Estimated Creatinine Clearance: 104.7 mL/min (by C-G formula based on SCr of 0.49 mg/dL).   Medical History: Past Medical History:  Diagnosis Date   Asthma    Diabetes mellitus without complication (HCC)    Hypertension     Medications:  Scheduled:   atorvastatin  40 mg Oral Daily   Chlorhexidine Gluconate Cloth  6 each Topical Daily   gabapentin  200 mg Oral BID   hydrocortisone sod succinate (SOLU-CORTEF) inj  100 mg Intravenous Q8H   insulin aspart  0-15 Units Subcutaneous TID AC & HS   melatonin  2.5 mg Per Tube QHS   Infusions:    sodium chloride Stopped (09/18/22 1155)   heparin 1,900 Units/hr (09/21/22 0500)   norepinephrine (LEVOPHED) Adult infusion Stopped (09/19/22 1815)   PRN: albuterol, cyclobenzaprine, docusate sodium, ibuprofen, iodixanol, oxyCODONE-acetaminophen, polyethylene glycol  Assessment: Brandi Myers is a 37 y.o. female presenting with SOB and cough. PMH significant for T2DM, HTN. Patient was not on [facility] PTA per chart review. D-dimer found to be  elevated and chest CT showed massive/submassive saddle pulmonary embolus. Heparin infusion was held due to Hgb <7. Suspected cause of Hgb drop is heavy menses. Per MD, will resume infusion due to clot burden (saddle PE and VTE) and transfuse as needed. Mechanical thrombectomy performed 09/18/2022. Pharmacy has been consulted to initiate and manage heparin infusion.   Baseline Labs: aPTT 35, HL <0.10, PT 15.2, INR 1.2, Hgb 8.4, Hct 31.9, Plt 232   Goal of Therapy:  Heparin level 0.3-0.7 units/ml Monitor platelets by anticoagulation protocol: Yes  Date Time HL Rate/Comment  6/7 0304 0.10 1300/subtherapeutic 6/7 0503 --- 0/hold infusion for Hgb of 6.7 6/7 0944 --- 1400/resume infusion per MD 6/7 1558 0.14 1400/subtherapeutic 6/7       2311    0.17     1600/SUBtherapeutic  6/8 0651 0.37 1900/therapeutic x1 6/8 1224 0.38 1900/therapeutic x2 6/9       0508    0.61    1900/therapeutic X 3  Plan:  No boluses per MD Continue heparin drip rate at 1900 units/hr  Check HL with AM labs Continue to monitor H&H and platelets daily while on heparin infusion   Dondi Aime D, PharmD Clinical Pharmacist 09/21/2022 5:47 AM

## 2022-09-21 NOTE — Plan of Care (Signed)
  Problem: Education: Goal: Ability to describe self-care measures that may prevent or decrease complications (Diabetes Survival Skills Education) will improve Outcome: Progressing Goal: Individualized Educational Video(s) Outcome: Progressing   Problem: Coping: Goal: Ability to adjust to condition or change in health will improve Outcome: Progressing   Problem: Fluid Volume: Goal: Ability to maintain a balanced intake and output will improve Outcome: Progressing   Problem: Health Behavior/Discharge Planning: Goal: Ability to identify and utilize available resources and services will improve Outcome: Progressing Goal: Ability to manage health-related needs will improve Outcome: Progressing   Problem: Metabolic: Goal: Ability to maintain appropriate glucose levels will improve Outcome: Progressing   Problem: Nutritional: Goal: Maintenance of adequate nutrition will improve Outcome: Progressing   Problem: Nutritional: Goal: Maintenance of adequate nutrition will improve Outcome: Progressing Goal: Progress toward achieving an optimal weight will improve Outcome: Progressing   Problem: Skin Integrity: Goal: Risk for impaired skin integrity will decrease Outcome: Progressing   Problem: Tissue Perfusion: Goal: Adequacy of tissue perfusion will improve Outcome: Progressing   Problem: Education: Goal: Knowledge of General Education information will improve Description: Including pain rating scale, medication(s)/side effects and non-pharmacologic comfort measures Outcome: Progressing   Problem: Health Behavior/Discharge Planning: Goal: Ability to manage health-related needs will improve Outcome: Progressing   Problem: Clinical Measurements: Goal: Ability to maintain clinical measurements within normal limits will improve Outcome: Progressing Goal: Will remain free from infection Outcome: Progressing Goal: Diagnostic test results will improve Outcome: Progressing Goal:  Respiratory complications will improve Outcome: Progressing Goal: Cardiovascular complication will be avoided Outcome: Progressing   Problem: Activity: Goal: Risk for activity intolerance will decrease Outcome: Progressing   Problem: Nutrition: Goal: Adequate nutrition will be maintained Outcome: Progressing   Problem: Coping: Goal: Level of anxiety will decrease Outcome: Progressing   Problem: Elimination: Goal: Will not experience complications related to bowel motility Outcome: Progressing Goal: Will not experience complications related to urinary retention Outcome: Progressing   Problem: Pain Managment: Goal: General experience of comfort will improve Outcome: Progressing   Problem: Safety: Goal: Ability to remain free from injury will improve Outcome: Progressing   Problem: Skin Integrity: Goal: Risk for impaired skin integrity will decrease Outcome: Progressing

## 2022-09-21 NOTE — Progress Notes (Signed)
1930 patient alert x4 able to female all needs known on 8L [place] HF, patient given I/S to help decrease 02 needs patient understood I/S education well.   2000 decreased 02 to 6L [place] HF

## 2022-09-21 NOTE — Progress Notes (Addendum)
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  CT scan showed saddle pulmonary emboli, patient was seen by vascular surgery, emergent thrombectomy was performed on 6/6.  Patient also had a significant hypotension, required Levophed in ICU. Echocardiogram showed normal ejection fraction, with increased pulmonary pressure consistent with acute cor pulmonale.  Duplex ultrasound showed bilateral lower extremity DVT. 6/8. Patient had a persistent hypotension, she was placed on hydrocortisone stress dose for adrenal crisis, blood pressure is better. Patient also developed acute hypoxemia, started bronchodilator. 6/9.  Give her lower dose IV Lasix, transferred to regular medical floor. Anticoagulation changed to Eliquis.   Active Problems:   Benign essential hypertension   Adjustment disorder with mixed disturbance of emotions and conduct   Acute saddle pulmonary embolism with acute cor pulmonale (HCC)   Iron deficiency anemia   Obstructive cardiovascular shock (HCC)   Adrenal crisis (HCC)   Acute blood loss anemia   Obesity (BMI 30.0-34.9)   Uncontrolled type 2 diabetes mellitus with hyperglycemia (HCC)   History of stroke with current residual effects   Leg DVT (deep venous thromboembolism), acute, bilateral (HCC)   Acute hypoxemic respiratory failure (HCC)   Assessment and Plan: Acute hypoxemic respiratory failure. Most likely secondary to acute cor pulmonale, patient has significant hypoxemia since yesterday, was placed on 10 L oxygen.  She is treated with bronchodilators as she has  been producing some mucus.  Procalcitonin level not elevated, no pneumonia suspected. Her oxygenation seems to be better today, will continue wean off oxygen.  She has a mild elevation in BNP, will give her lower dose of IV Lasix.  Saddle pulmonary emboli with acute cor pulmonale. Obstructive cardiogenic shock. Bilateral lower extremity DVT. Patient is status post pulmonary thrombectomy.   Patient has been on heparin drip since admission, since her oxygenation is more stable today, will transition to Eliquis. Transfer to general medical floor.   Hypotension. Likely adrenal crisis. Patient states that she was on steroids long-term, recently discontinued.  Her blood pressure was low normally, but more significant in the hospital.  She most likely has adrenal crisis,  She is currently on stress dose steroids.  Cortisol level was drawn after Solu-Cortef, invalid. May consider low-dose prednisone at time of discharge.   Acute blood loss anemia secondary to vaginal bleeding. Iron deficient anemia. Patient had received a unit of blood transfusion at admission, also received IV iron.  Patient prior B12 level was borderline, repeat B12 and homocystine level.  I will give a dose of B12 injection.   Type 2 diabetes. Continue sliding scale insulin   History of stroke with extremity weakness. Continue home medicines,   Obesity.  BMI 32.17. Diet and exercise advised.      Subjective:  Patient feels better today.  Denies any short of breath, oxygen saturation much better. No cough.  Physical Exam: Vitals:   09/21/22 0519 09/21/22 0522 09/21/22 0600 09/21/22 0800  BP:   91/61 111/75  Pulse: 72 87 68 60  Resp: 18 18 16 13   Temp:      TempSrc:      SpO2: Marland Kitchen)  86% (!) 89% 92% 100%  Weight:      Height:       General exam: Appears calm and comfortable  Respiratory system: Decreased breathing sounds. Respiratory effort normal. Cardiovascular system: S1 & S2 heard, RRR. No JVD, murmurs,  rubs, gallops or clicks. No pedal edema. Gastrointestinal system: Abdomen is nondistended, soft and nontender. No organomegaly or masses felt. Normal bowel sounds heard. Central nervous system: Alert and oriented x2. No focal neurological deficits. Extremities: Symmetric 5 x 5 power. Skin: No rashes, lesions or ulcers Psychiatry:  Mood & affect appropriate.    Data Reviewed:  Lab results reviewed.   Family Communication: None  Disposition: Status is: Inpatient Remains inpatient appropriate because: Severity of disease, IV treatment.     Time spent: 50 minutes  Author: Marrion Coy, MD 09/21/2022 10:51 AM  For on call review [email or web address].

## 2022-09-22 ENCOUNTER — Other Ambulatory Visit (HOSPITAL_COMMUNITY): Payer: Self-pay

## 2022-09-22 DIAGNOSIS — F4325 Adjustment disorder with mixed disturbance of emotions and conduct: Secondary | ICD-10-CM | POA: Diagnosis not present

## 2022-09-22 DIAGNOSIS — I2602 Saddle embolus of pulmonary artery with acute cor pulmonale: Secondary | ICD-10-CM | POA: Diagnosis not present

## 2022-09-22 DIAGNOSIS — J9601 Acute respiratory failure with hypoxia: Secondary | ICD-10-CM | POA: Diagnosis not present

## 2022-09-22 DIAGNOSIS — D62 Acute posthemorrhagic anemia: Secondary | ICD-10-CM | POA: Diagnosis not present

## 2022-09-22 LAB — GLUCOSE, CAPILLARY
Glucose-Capillary: 287 mg/dL — ABNORMAL HIGH (ref 70–99)
Glucose-Capillary: 358 mg/dL — ABNORMAL HIGH (ref 70–99)
Glucose-Capillary: 164 mg/dL — ABNORMAL HIGH (ref 70–99)
Glucose-Capillary: 260 mg/dL — ABNORMAL HIGH (ref 70–99)

## 2022-09-22 LAB — CBC
HCT: 29.1 % — ABNORMAL LOW (ref 36.0–46.0)
Hemoglobin: 7.9 g/dL — ABNORMAL LOW (ref 12.0–15.0)
MCH: 19.5 pg — ABNORMAL LOW (ref 26.0–34.0)
MCHC: 27.1 g/dL — ABNORMAL LOW (ref 30.0–36.0)
MCV: 71.7 fL — ABNORMAL LOW (ref 80.0–100.0)
Platelets: 277 10*3/uL (ref 150–400)
RBC: 4.06 MIL/uL (ref 3.87–5.11)
RDW: 23.6 % — ABNORMAL HIGH (ref 11.5–15.5)
WBC: 12.6 10*3/uL — ABNORMAL HIGH (ref 4.0–10.5)
nRBC: 0.4 % — ABNORMAL HIGH (ref 0.0–0.2)

## 2022-09-22 LAB — HEMOGLOBIN
Hemoglobin: 7.9 g/dL — ABNORMAL LOW (ref 12.0–15.0)
Hemoglobin: 8 g/dL — ABNORMAL LOW (ref 12.0–15.0)
Hemoglobin: 8.2 g/dL — ABNORMAL LOW (ref 12.0–15.0)

## 2022-09-22 LAB — HOMOCYSTEINE: Homocysteine: 6.3 umol/L (ref 0.0–14.5)

## 2022-09-22 LAB — RENAL FUNCTION PANEL
Albumin: 2.8 g/dL — ABNORMAL LOW (ref 3.5–5.0)
Anion gap: 10 (ref 5–15)
BUN: 7 mg/dL (ref 6–20)
CO2: 26 mmol/L (ref 22–32)
Calcium: 8.1 mg/dL — ABNORMAL LOW (ref 8.9–10.3)
Chloride: 100 mmol/L (ref 98–111)
Creatinine, Ser: 0.6 mg/dL (ref 0.44–1.00)
GFR, Estimated: >60 mL/min (ref >60)
Glucose, Bld: 335 mg/dL — ABNORMAL HIGH (ref 70–99)
Phosphorus: 3.6 mg/dL (ref 2.5–4.6)
Potassium: 3.5 mmol/L (ref 3.5–5.1)
Sodium: 136 mmol/L (ref 135–145)

## 2022-09-22 LAB — MAGNESIUM: Magnesium: 2 mg/dL (ref 1.7–2.4)

## 2022-09-22 MED ORDER — INSULIN GLARGINE-YFGN 100 UNIT/ML ~~LOC~~ SOLN
10.0000 [IU] | Freq: Every day | SUBCUTANEOUS | Status: DC
  Administered 2022-09-22 – 2022-09-23 (×2): 10 [IU] via SUBCUTANEOUS
  Filled 2022-09-22 (×3): qty 0.1

## 2022-09-22 MED ORDER — HYDROCORTISONE SOD SUC (PF) 100 MG IJ SOLR
100.0000 mg | Freq: Two times a day (BID) | INTRAMUSCULAR | Status: DC
  Administered 2022-09-22: 100 mg via INTRAVENOUS
  Filled 2022-09-22 (×2): qty 2

## 2022-09-22 NOTE — Progress Notes (Signed)
Mobility Specialist - Progress Note   09/22/22 1005  Mobility  Activity Stood at bedside  Level of Assistance Standby assist, set-up cues, supervision of patient - no hands on  Assistive Device None  Range of Motion/Exercises Active  Activity Response Tolerated well  $Mobility charge 1 Mobility  Mobility Specialist Start Time (ACUTE ONLY) 0955  Mobility Specialist Stop Time (ACUTE ONLY) 1004  Mobility Specialist Time Calculation (min) (ACUTE ONLY) 9 min   MS responding to bed alarm, pt requesting help finding cellular device. Pt completed bed mob indep, STS from EOB and stood at bedside for ~2 mins with supervision while MS assisted in finding the phone. Pt returned to bed, left supine with alarm set and needs within reach.   Zetta Bills Mobility Specialist 09/22/22 10:08 AM

## 2022-09-22 NOTE — Inpatient Diabetes Management (Signed)
Inpatient Diabetes Program Recommendations  AACE/ADA: New Consensus Statement on Inpatient Glycemic Control   Target Ranges:  Prepandial:   less than 140 mg/dL      Peak postprandial:   less than 180 mg/dL (1-2 hours)      Critically ill patients:  140 - 180 mg/dL    Latest Reference Range & Units 09/21/22 07:34 09/21/22 11:30 09/21/22 17:28 09/21/22 19:45 09/22/22 08:12  Glucose-Capillary 70 - 99 mg/dL 409 (H) 811 (H) 914 (H) 282 (H) 287 (H)  h Review of Glycemic Control  Diabetes history: DM2 Outpatient Diabetes medications: Levemir 40 units daily, Novolog 6 units TID with meals Current orders for Inpatient glycemic control: Novolog 0-15 units AC&HS; Solucortef 100 mg Q8H  Inpatient Diabetes Program Recommendations:    Insulin: CBGs have ranged from 282-306 mg/dl over the past 24 hours.  If steroids are continued, please consider ordering Semglee 20 units Q24H (to start now).  Orlando Penner, RN, MSN, CDCES Diabetes Coordinator Inpatient Diabetes Program [phone] (Team Pager from 8am to 5pm)

## 2022-09-22 NOTE — TOC Initial Note (Signed)
Transition of Care [facility]) - Initial/Assessment Note    Patient Details  Name: Brandi Myers MRN: [id number] Date of Birth: September 29, 1985  Transition of [facility]) CM/SW Contact:    Allena Katz, LCSW Phone Number: 09/22/2022, 4:05 PM  Clinical Narrative:    Pt admitted from Home with shortness of breath. Pt is independent at baseline.  [facility]. Pt is currently on 6L HFNC. TOC to monitor for oxygen and home health needs.               Expected Discharge Plan: Home/Self Care Barriers to Discharge: Continued Medical Work up   Patient Goals and CMS Choice Patient states their goals for this hospitalization and ongoing recovery are:: return home          Expected Discharge Plan and Services                                              Prior Living Arrangements/Services   Lives with:: Self Patient language and need for interpreter reviewed:: Yes Do you feel safe going back to the place where you live?: Yes      Need for Family Participation in Patient Care: No (Comment) Care giver support system in place?: No (comment)   Criminal Activity/Legal Involvement Pertinent to Current Situation/Hospitalization: No - Comment as needed  Activities of Daily Living      Permission Sought/Granted                  Emotional Assessment       Orientation: : Oriented to Self, Oriented to Place, Oriented to  Time, Oriented to Situation      Admission diagnosis:  Pulmonary embolism (HCC) [I26.99] Acute saddle pulmonary embolism with acute cor pulmonale (HCC) [I26.02] Iron deficiency anemia, unspecified iron deficiency anemia type [D50.9] Patient Active Problem List   Diagnosis Date Noted   Acute hypoxemic respiratory failure (HCC) 09/21/2022   Adrenal crisis (HCC) 09/20/2022   Acute blood loss anemia 09/20/2022   Obesity (BMI 30.0-34.9) 09/20/2022   Uncontrolled type 2 diabetes mellitus with hyperglycemia (HCC)  09/20/2022   History of stroke with current residual effects 09/20/2022   Leg DVT (deep venous thromboembolism), acute, bilateral (HCC) 09/20/2022   Obstructive cardiovascular shock (HCC) 09/19/2022   Acute saddle pulmonary embolism with acute cor pulmonale (HCC) 09/18/2022   Iron deficiency anemia 09/18/2022   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 ([address]), [place] - [address] [place]) [place] [address] Phone: [phone] Fax: [phone]     Social Determinants of Health (SDOH) Social History: SDOH Screenings   Tobacco Use: High Risk (09/19/2022)   SDOH Interventions:  Readmission Risk Interventions    09/22/2022    3:54 PM  Readmission Risk Prevention Plan  Transportation Screening Complete  PCP or Specialist Appt within 5-7 Days Complete  Home Care Screening Complete  Medication Review (RN CM) Complete

## 2022-09-22 NOTE — Progress Notes (Signed)
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  CT scan showed saddle pulmonary emboli, patient was seen by vascular surgery, emergent thrombectomy was performed on 6/6.  Patient also had a significant hypotension, required Levophed in ICU. Echocardiogram showed normal ejection fraction, with increased pulmonary pressure consistent with acute cor pulmonale.  Duplex ultrasound showed bilateral lower extremity DVT. 6/8. Patient had a persistent hypotension, she was placed on hydrocortisone stress dose for adrenal crisis, blood pressure is better. Patient also developed acute hypoxemia, started bronchodilator. 6/9.  Give her lower dose IV Lasix, transferred to regular medical floor. Anticoagulation changed to Eliquis.  6/10: Reduced Solu-Cortef from 3 times daily-> twice daily.  Added insulin Semglee.  Continue to wean off oxygen  Assessment and Plan:  Acute hypoxemic respiratory failure. Most likely secondary to acute cor pulmonale, patient has significant hypoxemia since yesterday, was placed on 10 L oxygen.  She is treated with bronchodilators as she has been producing some mucus.  Procalcitonin level not elevated, no pneumonia suspected. Her oxygenation seems to be better today, will continue wean off oxygen.  I have reduced the frequency of Solu-Cortef from 3 times daily to twice daily   Saddle pulmonary emboli with acute cor pulmonale. Obstructive cardiogenic shock. Bilateral lower extremity DVT. Patient is status post pulmonary thrombectomy.   Patient has been on heparin drip since  admission, she is transitioned to Eliquis on 6/9.   Hypotension. Likely adrenal crisis. Patient states that she was on steroids long-term, recently discontinued.  Her blood pressure was low normally, but more significant in the hospital.  She most likely has adrenal crisis,  She is currently on stress dose steroids.  Cortisol level was drawn after Solu-Cortef, invalid. May consider low-dose prednisone at time of discharge.  Currently on Solu-Cortef twice daily   Acute blood loss anemia secondary to vaginal bleeding. Iron deficient anemia. Patient had received a unit of blood transfusion at admission, also received IV iron.  Patient prior B12 level was borderline, repeat B12 and homocystine level.  Status post B12 injection on 6/9.   Type 2 diabetes. Continue sliding scale insulin.  Added insulin Semglee 10 units subcu daily for now   History of stroke with extremity weakness. Continue home medicines,   Obesity.  BMI 32.17. Diet and exercise advised.     Subjective: Feeling tired.  Agreeable to work with therapy.  Wants to eat and drink as she is hungry  Physical Exam: Vitals:   09/21/22 1515 09/21/22 1955 09/22/22 0007 09/22/22 0811  BP: (!) 91/57 (!) 89/63 107/70 119/72  Pulse: 67 69 66 (!) 57  Resp: 16 18 18 16   Temp: 98.2 F (36.8 C) 98.7 F (37.1 C) 98.4 F (36.9 C) 98.1 F (36.7 C)  TempSrc: Oral  Oral   SpO2: 96% 94% 90% 95%  Weight:      Height:       General exam: Appears calm and comfortable  Respiratory system: Decreased breathing sounds. Respiratory effort normal. Cardiovascular system: S1 & S2 heard, RRR. No JVD, murmurs, rubs, gallops or clicks. No pedal edema. Gastrointestinal system: Abdomen is  nondistended, soft and nontender. No organomegaly or masses felt. Normal bowel sounds heard. Central nervous system: Alert and oriented x2. No focal neurological deficits. Extremities: Symmetric 5 x 5 power. Skin: No rashes, lesions or ulcers Psychiatry:  Mood &  affect appropriate. Data Reviewed:  Hemoglobin 8.0  Family Communication: None at bedside  Disposition: Status is: Inpatient Remains inpatient appropriate because: Management of respiratory failure  Planned Discharge Destination: Home with Home Health   DVT prophylaxis-Eliquis Time spent: 35 minutes  Author: Delfino Lovett, MD 09/22/2022 11:46 AM  For on call review [email or web address].

## 2022-09-22 NOTE — TOC Benefit Eligibility Note (Signed)
Patient Advocate Encounter  Insurance verification completed.    The patient is currently admitted and upon discharge could be taking Eliquis 5 mg.  The current 30 day co-pay is $4.00.   The patient is insured through [employer]   This test claim was processed through [facility]- copay amounts may vary at other pharmacies due to pharmacy/plan contracts, or as the patient moves through the different stages of their insurance plan.  Godwin Tedesco, CPHT Pharmacy Patient Advocate Specialist [facility] Patient Advocate Team Direct Number: [phone]  Fax: [phone]       

## 2022-09-23 DIAGNOSIS — E1165 Type 2 diabetes mellitus with hyperglycemia: Secondary | ICD-10-CM

## 2022-09-23 DIAGNOSIS — I82403 Acute embolism and thrombosis of unspecified deep veins of lower extremity, bilateral: Secondary | ICD-10-CM

## 2022-09-23 DIAGNOSIS — I2602 Saddle embolus of pulmonary artery with acute cor pulmonale: Secondary | ICD-10-CM | POA: Diagnosis not present

## 2022-09-23 DIAGNOSIS — J9601 Acute respiratory failure with hypoxia: Secondary | ICD-10-CM | POA: Diagnosis not present

## 2022-09-23 LAB — CBC
HCT: 30.6 % — ABNORMAL LOW (ref 36.0–46.0)
Hemoglobin: 8.4 g/dL — ABNORMAL LOW (ref 12.0–15.0)
MCH: 20 pg — ABNORMAL LOW (ref 26.0–34.0)
MCHC: 27.5 g/dL — ABNORMAL LOW (ref 30.0–36.0)
MCV: 73 fL — ABNORMAL LOW (ref 80.0–100.0)
Platelets: 292 10*3/uL (ref 150–400)
RBC: 4.19 MIL/uL (ref 3.87–5.11)
RDW: 24.7 % — ABNORMAL HIGH (ref 11.5–15.5)
WBC: 10.6 10*3/uL — ABNORMAL HIGH (ref 4.0–10.5)
nRBC: 0.7 % — ABNORMAL HIGH (ref 0.0–0.2)

## 2022-09-23 LAB — HEMOGLOBIN
Hemoglobin: 8.4 g/dL — ABNORMAL LOW (ref 12.0–15.0)
Hemoglobin: 8.8 g/dL — ABNORMAL LOW (ref 12.0–15.0)

## 2022-09-23 LAB — GLUCOSE, CAPILLARY
Glucose-Capillary: 314 mg/dL — ABNORMAL HIGH (ref 70–99)
Glucose-Capillary: 303 mg/dL — ABNORMAL HIGH (ref 70–99)
Glucose-Capillary: 224 mg/dL — ABNORMAL HIGH (ref 70–99)
Glucose-Capillary: 283 mg/dL — ABNORMAL HIGH (ref 70–99)

## 2022-09-23 LAB — RENAL FUNCTION PANEL
Albumin: 2.8 g/dL — ABNORMAL LOW (ref 3.5–5.0)
Anion gap: 9 (ref 5–15)
BUN: 12 mg/dL (ref 6–20)
CO2: 28 mmol/L (ref 22–32)
Calcium: 7.8 mg/dL — ABNORMAL LOW (ref 8.9–10.3)
Chloride: 101 mmol/L (ref 98–111)
Creatinine, Ser: 0.76 mg/dL (ref 0.44–1.00)
GFR, Estimated: >60 mL/min (ref >60)
Glucose, Bld: 284 mg/dL — ABNORMAL HIGH (ref 70–99)
Phosphorus: 3.3 mg/dL (ref 2.5–4.6)
Potassium: 3.6 mmol/L (ref 3.5–5.1)
Sodium: 138 mmol/L (ref 135–145)

## 2022-09-23 MED ORDER — FERROUS SULFATE 325 (65 FE) MG PO TBEC: 325.0000 mg | DELAYED_RELEASE_TABLET | Freq: Every day | ORAL | 0 refills | Status: AC

## 2022-09-23 MED ORDER — PREDNISONE 10 MG PO TABS
5.0000 mg | ORAL_TABLET | Freq: Every day | ORAL | Status: DC
  Administered 2022-09-23 – 2022-09-25 (×3): 5 mg via ORAL
  Filled 2022-09-23 (×3): qty 1

## 2022-09-23 MED ORDER — PREDNISONE 5 MG PO TABS: 5.0000 mg | ORAL_TABLET | Freq: Every day | ORAL | 0 refills | Status: DC

## 2022-09-23 MED ORDER — APIXABAN 5 MG PO TABS: 10.0000 mg | ORAL_TABLET | Freq: Two times a day (BID) | ORAL | 0 refills | Status: DC

## 2022-09-23 MED ORDER — INSULIN ASPART 100 UNIT/ML ~~LOC~~ SOLN: 6.0000 [IU] | Freq: Three times a day (TID) | SUBCUTANEOUS | 11 refills | Status: DC

## 2022-09-23 MED ORDER — APIXABAN 5 MG PO TABS: 5.0000 mg | ORAL_TABLET | Freq: Two times a day (BID) | ORAL | 0 refills | Status: DC

## 2022-09-23 MED ORDER — INSULIN DETEMIR 100 UNIT/ML ~~LOC~~ SOLN: 20.0000 [IU] | Freq: Two times a day (BID) | SUBCUTANEOUS | 0 refills | Status: DC

## 2022-09-23 NOTE — Progress Notes (Signed)
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  CT scan showed saddle pulmonary emboli, we saw the patient and an emergent thrombectomy was performed on 6/6.  Patient also had a significant hypotension, required Levophed in ICU after the procedure. Patient was found to be in adrenal crisis and given hydrocortisone stress dose.  Patient is now postop day 5 from pulmonary thrombectomy.  On exam this this morning patient is resting comfortably in bed.  She has been transitioned off of a heparin infusion to oral anticoagulation.  She started on Eliquis 10 mg twice daily for 7 days to then be transition to Eliquis 5 mg twice daily.  Patient continues to use nasal cannula oxygen at 2 to 3 L for saturations of 96 to 97%.   Vitals:   09/23/22 0746 09/23/22 0837  BP:    Pulse:    Resp:    Temp: 97.7 F (36.5 C)   SpO2: 100% 96%   Physical Exam: Cardiac:  RRR, normal S1, S2 no JVD, murmurs, rubs, clicks, or gallops. Lungs: Normal respiratory effort with scattered rhonchi throughout.  Managed in the bases. Incisions: Right groin incision with dressing clean dry and intact.  No hematoma seroma to note Extremities: Bilateral lower extremities warm to touch with +1 DP/PT pulses.  Left upper extremity contracted post CVA. Abdomen: Positive bowel sounds throughout, soft, nontender, nondistended. Neurologic: Alert and oriented x 3 and follows commands.  Answers all questions appropriately.  CBC    Component Value Date/Time   WBC 10.6 (H) 09/23/2022 0206   RBC 4.19 09/23/2022 0206   HGB 8.4 (L) 09/23/2022 0206   HGB 11.4 (L) 10/22/2012 1800   HCT 30.6 (L) 09/23/2022 0206   HCT 31.4 (L)  10/23/2012 0518   PLT 292 09/23/2022 0206   PLT 191 10/22/2012 1800   MCV 73.0 (L) 09/23/2022 0206   MCV 87 10/22/2012 1800   MCH 20.0 (L) 09/23/2022 0206   MCHC 27.5 (L) 09/23/2022 0206   RDW 24.7 (H) 09/23/2022 0206   RDW 14.3 10/22/2012 1800   LYMPHSABS 3.3 07/22/2022 0043   LYMPHSABS 2.4 10/22/2012 1800   MONOABS 0.7 07/22/2022 0043   MONOABS 1.0 (H) 10/22/2012 1800   EOSABS 0.2 07/22/2022 0043   EOSABS 0.1 10/22/2012 1800   BASOSABS 0.1 07/22/2022 0043   BASOSABS 0.1 10/22/2012 1800    BMET    Component Value Date/Time   NA 138 09/23/2022 0206   K 3.6 09/23/2022 0206   CL 101 09/23/2022 0206   CO2 28 09/23/2022 0206   GLUCOSE 284 (H) 09/23/2022 0206   BUN 12 09/23/2022 0206   CREATININE 0.76 09/23/2022 0206   CALCIUM 7.8 (L) 09/23/2022 0206   GFRNONAA >60 09/23/2022 0206   GFRAA >60 01/04/2018 2056    INR    Component Value Date/Time   INR 1.2 09/18/2022 1030     Intake/Output Summary (Last 24 hours) at 09/23/2022 1030 Last data filed at 09/23/2022 0800 Gross per 24 hour  Intake 960 ml  Output --  Net 960 ml     Assessment/Plan:  37 y.o. female is s/p pulmonary thrombectomy 5 Days Post-Op   PLAN: Patient is recovering as expected from pulmonary thrombectomy with  saddle pulmonary embolisms.  Patient also noted to have bilateral lower below the knee deep vein thrombosis.  Patient's been transitioned from heparin infusion to Eliquis 10 mg twice daily for 7 days to then be converted to 5 mg twice daily. Per vascular surgery okay with discharge if primary team agrees.  DVT prophylaxis: Eliquis 10 mg twice daily   Marcie Bal [facility] 09/23/2022 10:30 AM

## 2022-09-23 NOTE — Plan of Care (Signed)

## 2022-09-23 NOTE — Progress Notes (Signed)
Patient alert and oriented x 4. Patient had discharge order but Patient did not have any safe place to go. Need assistance with ADLs,. Uncle denied to come pick her up. Case manager is aware.Will have to find placement. Complain of back pain  Narco was given x 2. Effective. Wean off oxygen. On room air. Vital sign with in normal limit.

## 2022-09-23 NOTE — Progress Notes (Signed)
Called "uncle" at [phone] provided by patient regarding discharge. Uncle said she "does not live here and is not getting in my mothers house. She will have to find somewhere else to go." [facility] and RN aware. Perlie Mayo, RN

## 2022-09-23 NOTE — Inpatient Diabetes Management (Signed)
Inpatient Diabetes Program Recommendations  AACE/ADA: New Consensus Statement on Inpatient Glycemic Control   Target Ranges:  Prepandial:   less than 140 mg/dL      Peak postprandial:   less than 180 mg/dL (1-2 hours)      Critically ill patients:  140 - 180 mg/dL    Latest Reference Range & Units 09/22/22 08:12 09/22/22 11:21 09/22/22 15:42 09/22/22 20:41 09/23/22 07:45  Glucose-Capillary 70 - 99 mg/dL 161 (H) 096 (H) 045 (H) 260 (H) 314 (H)   Review of Glycemic Control  Diabetes history: DM2 Outpatient Diabetes medications: Levemir 40 units daily, Novolog 6 units TID with meals Current orders for Inpatient glycemic control: Semglee 10 units daily, Novolog 0-15 units AC&HS; Prednisone 5 mg QAM  Inpatient Diabetes Program Recommendations:    Insulin: Please consider increasing Semglee to 15 units daily and ordering Novolog 3 units TID with meals for meal coverage if patient eats at least 50% of meals.  Thanks, Orlando Penner, RN, MSN, CDCES Diabetes Coordinator Inpatient Diabetes Program [phone] (Team Pager from 8am to 5pm)

## 2022-09-23 NOTE — Discharge Summary (Signed)
Physician Discharge Summary   Patient: Brandi Myers MRN: [id number] DOB: June 17, 1985  Admit date:     09/18/2022  Discharge date: 09/23/22  Discharge Physician: Marrion Coy   [facility]P: [name]   Recommendations at discharge:   PCP in 1 week. Follow-up with vascular surgery in 1 months.  Discharge Diagnoses: Active Problems:   Benign essential hypertension   Adjustment disorder with mixed disturbance of emotions and conduct   Acute saddle pulmonary embolism with acute cor pulmonale (HCC)   Iron deficiency anemia   Obstructive cardiovascular shock (HCC)   Adrenal crisis (HCC)   Acute blood loss anemia   Obesity (BMI 30.0-34.9)   Uncontrolled type 2 diabetes mellitus with hyperglycemia (HCC)   History of stroke with current residual effects   Leg DVT (deep venous thromboembolism), acute, bilateral (HCC)   Acute hypoxemic respiratory failure (HCC)  Resolved Problems:   * No resolved hospital problems. 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  CT scan showed saddle pulmonary emboli, patient was seen by vascular surgery, emergent thrombectomy was performed on 6/6.  Patient also had a significant hypotension, required Levophed in ICU. Echocardiogram showed normal ejection fraction, with increased pulmonary pressure consistent with acute cor pulmonale.  Duplex ultrasound showed bilateral lower extremity DVT. 6/8. Patient had a persistent hypotension, she was placed on hydrocortisone stress dose for adrenal crisis, blood pressure is better. Patient also developed acute hypoxemia, started bronchodilator. 6/9.  Give her lower dose IV Lasix, transferred to regular medical floor. Anticoagulation changed  to Eliquis.  6/10: Reduced Solu-Cortef from 3 times daily-> twice daily.  Added insulin Semglee.  Continue to wean off oxygen  Condition had improved, off oxygen, blood pressure more stable.  Medically stable to be discharged today.  Assessment and Plan: Acute hypoxemic respiratory failure. Most likely secondary to acute cor pulmonale, patient has significant hypoxemia since yesterday, was placed on 10 L oxygen.  She is treated with bronchodilators as she has been producing some mucus.  Procalcitonin level not elevated, no pneumonia suspected.  Condition much improved, she is off oxygen with good saturation.  No need for home oxygen.  Saddle pulmonary emboli with acute cor pulmonale. Obstructive cardiogenic shock. Bilateral lower extremity DVT. Patient is status post pulmonary thrombectomy.   Patient has been on heparin drip since admission, she is transitioned to Eliquis on 6/9.   Hypotension. Likely adrenal crisis. Patient states that she was on steroids long-term, recently discontinued.  Her blood pressure was low normally, but more significant in the hospital.  She most likely has adrenal crisis,  She is currently on stress dose steroids.  Cortisol level was drawn after Solu-Cortef, invalid. Condition much improved, blood pressure much better.  I will continue at least 7 days of a lower dose prednisone 5 mg daily.  Follow-up with PCP as outpatient.   Acute blood loss anemia secondary to vaginal bleeding. Iron deficient anemia. Patient had received a unit of blood transfusion at admission, also received IV iron.  Continue oral iron.  Hemoglobin is more stable. B12 level still borderline, homocystine level normal.  No additional B12 needed.   Type 2 diabetes. I resume the patient home dose of insulin, she was on Levemir 40 units daily, I have changed  that to 20 units twice a day.  Continue NovoLog at home dose.     History of stroke with extremity weakness. Continue home  medicines,   Obesity.  BMI 32.17. Diet and exercise advised.        Consultants: Vascular Procedures performed: Pulmonary thrombectomy. Disposition: Home Diet recommendation:  Discharge Diet Orders (From admission, onward)     Start     Ordered   09/23/22 0000  Diet Carb Modified        09/23/22 1027           Carb modified diet DISCHARGE MEDICATION: Allergies as of 09/23/2022       Reactions   Norco [hydrocodone-acetaminophen] Itching, Nausea And Vomiting, Swelling        Medication List     STOP taking these medications    amoxicillin 500 MG capsule Commonly known as: AMOXIL   metoprolol tartrate 50 MG tablet Commonly known as: LOPRESSOR   risperiDONE 0.5 MG tablet Commonly known as: RISPERDAL   sertraline 25 MG tablet Commonly known as: ZOLOFT   traMADol 50 MG tablet Commonly known as: Ultram       TAKE these medications    acetaminophen 500 MG tablet Commonly known as: TYLENOL Take 500 mg by mouth every 6 (six) hours as needed for headache, fever, moderate pain or mild pain. What changed: Another medication with the same name was removed. Continue taking this medication, and follow the directions you see here.   albuterol 108 (90 Base) MCG/ACT inhaler Commonly known as: VENTOLIN HFA Inhale 1-2 puffs into the lungs every 6 (six) hours as needed for wheezing or shortness of breath.   apixaban 5 MG Tabs tablet Commonly known as: ELIQUIS Take 2 tablets (10 mg total) by mouth 2 (two) times daily for 5 days.   apixaban 5 MG Tabs tablet Commonly known as: ELIQUIS Take 1 tablet (5 mg total) by mouth 2 (two) times daily. Start taking on: September 28, 2022   aspirin EC 81 MG tablet Take 81 mg by mouth daily. Swallow whole.   atorvastatin 40 MG tablet Commonly known as: LIPITOR Place 1 tablet (40 mg total) into feeding tube daily at 6 PM. What changed:  how to take this when to take this   cyclobenzaprine 10 MG tablet Commonly known as:  FLEXERIL Take 10 mg by mouth every 8 (eight) hours as needed for muscle spasms.   ferrous sulfate 325 (65 FE) MG EC tablet Take 1 tablet (325 mg total) by mouth daily with breakfast.   gabapentin 300 MG capsule Commonly known as: NEURONTIN Take 300 mg by mouth 2 (two) times daily.   insulin aspart 100 UNIT/ML injection Commonly known as: novoLOG Inject 6 Units into the skin 3 (three) times daily with meals. What changed:  when to take this Another medication with the same name was removed. Continue taking this medication, and follow the directions you see here.   insulin detemir 100 UNIT/ML injection Commonly known as: LEVEMIR Inject 0.2 mLs (20 Units total) into the skin 2 (two) times daily. What changed: how much to take   melatonin 3 MG Tabs tablet Place 1 tablet (3 mg total) into feeding tube at bedtime. What changed:  how to take this when to take this reasons to take this   multivitamin with minerals Tabs tablet Place 1 tablet into feeding tube daily. What changed: how to take this   predniSONE 5 MG tablet Commonly known as: DELTASONE Take 1 tablet (5 mg total)  by mouth daily with breakfast for 7 days. Start taking on: September 24, 2022        Follow-up Information     [facility] Follow up in 1 week(s).   Specialty: General Practice Contact information: [address] [place] [address] [phone]                Discharge Exam: Filed Weights   09/19/22 0500 09/20/22 0708 09/21/22 0500  Weight: 93.5 kg 87.7 kg 86.7 kg   General exam: Appears calm and comfortable  Respiratory system: Clear to auscultation. Respiratory effort normal. Cardiovascular system: S1 & S2 heard, RRR. No JVD, murmurs, rubs, gallops or clicks. No pedal edema. Gastrointestinal system: Abdomen is nondistended, soft and nontender. No organomegaly or masses felt. Normal bowel sounds heard. Central nervous system: Alert and oriented. No  focal neurological deficits. Extremities: Symmetric 5 x 5 power. Skin: No rashes, lesions or ulcers Psychiatry: Judgement and insight appear normal. Mood & affect appropriate.    Condition at discharge: good  The results of significant diagnostics from this hospitalization (including imaging, microbiology, ancillary and laboratory) are listed below for reference.   Imaging Studies: DG Chest Port 1 View  Result Date: 09/21/2022 CLINICAL DATA:  Pulmonary emboli EXAM: PORTABLE CHEST 1 VIEW COMPARISON:  09/18/2022 CT and chest radiograph FINDINGS: Telemetry leads overlie the chest. The cardiomediastinal silhouette is unremarkable. Focal opacity at the LATERAL LEFT lung base likely represents atelectasis or infarct. The lungs are otherwise clear. There is no evidence of pneumothorax or large pleural effusion. No acute bony abnormalities are noted. IMPRESSION: Focal opacity at the LATERAL LEFT lung base likely represents atelectasis vs infarct. Infection is considered less likely. Electronically Signed   By: Harmon Pier M.D.   On: 09/21/2022 13:38   PERIPHERAL VASCULAR CATHETERIZATION  Result Date: 09/18/2022 See surgical note for result.  ECHOCARDIOGRAM COMPLETE  Result Date: 09/18/2022    ECHOCARDIOGRAM REPORT   Patient Name:   Brandi Myers Date of Exam: 09/18/2022 Medical Rec #:  [id number]       Height:       65.0 in Accession #:    [id number]      Weight:       230.0 lb Date of Birth:  Jun 10, 1985      BSA:          2.099 m Patient Age:    36 years        BP:           112/62 mmHg Patient Gender: F               HR:           93 bpm. Exam Location:  [facility] Procedure: 2D Echo, Cardiac Doppler and Color Doppler Indications:     Pulmonary Embolus I26.09  History:         Patient has prior history of Echocardiogram examinations, most                  recent 07/19/2020. Risk Factors:Diabetes and Hypertension.  Sonographer:     Cristela Blue Referring Phys:  [id number] Judithe Modest Diagnosing Phys: Julien Nordmann MD  Sonographer Comments: Suboptimal apical window. IMPRESSIONS  1. Left ventricular ejection fraction, by estimation, is 60 to 65%. The left ventricle has normal function. The left ventricle has no regional wall motion abnormalities. Left ventricular diastolic parameters were normal. There is the interventricular septum is flattened in systole and diastole, consistent with  right ventricular pressure and volume overload.  2. Right ventricular systolic function is low normal. The right ventricular size is moderately enlarged. There is mildly elevated pulmonary artery systolic pressure. The estimated right ventricular systolic pressure is 44.2 mmHg.  3. The mitral valve is normal in structure. No evidence of mitral valve regurgitation. No evidence of mitral stenosis.  4. The aortic valve has an indeterminant number of cusps. Aortic valve regurgitation is not visualized. No aortic stenosis is present.  5. The inferior vena cava is normal in size with greater than 50% respiratory variability, suggesting right atrial pressure of 3 mmHg. FINDINGS  Left Ventricle: Left ventricular ejection fraction, by estimation, is 60 to 65%. The left ventricle has normal function. The left ventricle has no regional wall motion abnormalities. The left ventricular internal cavity size was normal in size. There is  no left ventricular hypertrophy. The interventricular septum is flattened in systole and diastole, consistent with right ventricular pressure and volume overload. Left ventricular diastolic parameters were normal. Right Ventricle: The right ventricular size is moderately enlarged. No increase in right ventricular wall thickness. Right ventricular systolic function is low normal. There is mildly elevated pulmonary artery systolic pressure. The tricuspid regurgitant  velocity is 3.13 m/s, and with an assumed right atrial pressure of 5 mmHg, the estimated right ventricular systolic pressure is 44.2 mmHg. Left Atrium: Left  atrial size was normal in size. Right Atrium: Right atrial size was normal in size. Pericardium: There is no evidence of pericardial effusion. Mitral Valve: The mitral valve is normal in structure. No evidence of mitral valve regurgitation. No evidence of mitral valve stenosis. Tricuspid Valve: The tricuspid valve is normal in structure. Tricuspid valve regurgitation is mild . No evidence of tricuspid stenosis. Aortic Valve: The aortic valve has an indeterminant number of cusps. Aortic valve regurgitation is not visualized. No aortic stenosis is present. Aortic valve mean gradient measures 4.0 mmHg. Aortic valve peak gradient measures 7.5 mmHg. Aortic valve area, by VTI measures 3.10 cm. Pulmonic Valve: The pulmonic valve was normal in structure. Pulmonic valve regurgitation is not visualized. No evidence of pulmonic stenosis. Aorta: The aortic root is normal in size and structure. Venous: The inferior vena cava is normal in size with greater than 50% respiratory variability, suggesting right atrial pressure of 3 mmHg. IAS/Shunts: No atrial level shunt detected by color flow Doppler.  LEFT VENTRICLE PLAX 2D LVIDd:         3.30 cm   Diastology LVIDs:         1.80 cm   LV e' medial:    13.80 cm/s LV PW:         1.10 cm   LV E/e' medial:  8.1 LV IVS:        1.00 cm   LV e' lateral:   14.80 cm/s LVOT diam:     2.10 cm   LV E/e' lateral: 7.6 LV SV:         56 LV SV Index:   27 LVOT Area:     3.46 cm  RIGHT VENTRICLE RV Basal diam:  3.40 cm RV Mid diam:    3.20 cm RV S prime:     12.70 cm/s TAPSE (M-mode): 2.5 cm LEFT ATRIUM             Index        RIGHT ATRIUM           Index LA diam:        2.30 cm 1.10  cm/m   RA Area:     13.20 cm LA Vol (A2C):   29.9 ml 14.24 ml/m  RA Volume:   32.00 ml  15.24 ml/m LA Vol (A4C):   36.4 ml 17.34 ml/m LA Biplane Vol: 33.4 ml 15.91 ml/m  AORTIC VALVE AV Area (Vmax):    2.81 cm AV Area (Vmean):   2.68 cm AV Area (VTI):     3.10 cm AV Vmax:           137.00 cm/s AV Vmean:           88.800 cm/s AV VTI:            0.180 m AV Peak Grad:      7.5 mmHg AV Mean Grad:      4.0 mmHg LVOT Vmax:         111.00 cm/s LVOT Vmean:        68.700 cm/s LVOT VTI:          0.161 m LVOT/AV VTI ratio: 0.89  AORTA Ao Root diam: 2.60 cm MITRAL VALVE                TRICUSPID VALVE MV Area (PHT): 3.20 cm     TR Peak grad:   39.2 mmHg MV Decel Time: 237 msec     TR Vmax:        313.00 cm/s MV E velocity: 112.00 cm/s MV A velocity: 96.90 cm/s   SHUNTS MV E/A ratio:  1.16         Systemic VTI:  0.16 m                             Systemic Diam: 2.10 cm Julien Nordmann MD Electronically signed by Julien Nordmann MD Signature Date/Time: 09/18/2022/4:02:06 PM    Final    US Venous Img Lower Bilateral  Result Date: 09/18/2022 CLINICAL DATA:  Left lower extremity swelling. Positive pulmonary embolism. EXAM: BILATERAL LOWER EXTREMITY VENOUS DOPPLER ULTRASOUND TECHNIQUE: Gray-scale sonography with graded compression, as well as color Doppler and duplex ultrasound were performed to evaluate the lower extremity deep venous systems from the level of the common femoral vein and including the common femoral, femoral, profunda femoral, popliteal and calf veins including the posterior tibial, peroneal and gastrocnemius veins when visible. The superficial great saphenous vein was also interrogated. Spectral Doppler was utilized to evaluate flow at rest and with distal augmentation maneuvers in the common femoral, femoral and popliteal veins. COMPARISON:  None Available. FINDINGS: RIGHT LOWER EXTREMITY Common Femoral Vein: No evidence of thrombus. Normal compressibility, respiratory phasicity and response to augmentation. Saphenofemoral Junction: No evidence of thrombus. Normal compressibility and flow on color Doppler imaging. Profunda Femoral Vein: No evidence of thrombus. Normal compressibility and flow on color Doppler imaging. Femoral Vein: No evidence of thrombus. Normal compressibility, respiratory phasicity and response to  augmentation. Popliteal Vein: Positive for occlusive thrombus. Calf Veins: Positive for occlusive thrombus involving the posterior tibial vein and peroneal vein. Superficial Great Saphenous Vein: No evidence of thrombus. Normal compressibility. Venous Reflux:  None. Other Findings:  None. LEFT LOWER EXTREMITY Common Femoral Vein: No evidence of thrombus. Normal compressibility, respiratory phasicity and response to augmentation. Saphenofemoral Junction: No evidence of thrombus. Normal compressibility and flow on color Doppler imaging. Profunda Femoral Vein: No evidence of thrombus. Normal compressibility and flow on color Doppler imaging. Femoral Vein: No evidence of thrombus. Normal compressibility, respiratory phasicity and response to augmentation. Popliteal Vein: Positive  for occlusive thrombus. Calf Veins: Positive for occlusive thrombus involving the posterior tibial vein and peroneal vein. Superficial Great Saphenous Vein: No evidence of thrombus. Normal compressibility. Venous Reflux:  None. Other Findings:  None. IMPRESSION: 1. Examination is positive for bilateral lower extremity DVT in the popliteal veins, posterior tibial veins and peroneal veins. Electronically Signed   By: Signa Kell M.D.   On: 09/18/2022 12:29   CT Angio Chest PE W and/or Wo Contrast  Result Date: 09/18/2022 CLINICAL DATA:  Pulmonary embolism (PE) suspected, low to intermediate prob, positive D-dimer EXAM: CT ANGIOGRAPHY CHEST WITH CONTRAST TECHNIQUE: Multidetector CT imaging of the chest was performed using the standard protocol during bolus administration of intravenous contrast. Multiplanar CT image reconstructions and MIPs were obtained to evaluate the vascular anatomy. RADIATION DOSE REDUCTION: This exam was performed according to the departmental dose-optimization program which includes automated exposure control, adjustment of the mA and/or kV according to patient size and/or use of iterative reconstruction technique.  CONTRAST:  75mL OMNIPAQUE IOHEXOL 350 MG/ML SOLN COMPARISON:  Chest XR, concurrent.  CTA chest, 07/28/2020. FINDINGS: Cardiovascular: Satisfactory opacification of the pulmonary arteries to the segmental level. Saddle embolus with extension to the upper and lower segmental pulmonary arteries, bilaterally. Leftward interventricular septum bowing with RV/LV ratio 1.75. Trace pericardial effusion. Mediastinum/Nodes: No enlarged mediastinal, hilar, or axillary lymph nodes. Thyroid gland, trachea, and esophagus demonstrate no significant findings. Lungs/Pleura: Lungs are clear without focal consolidation, mass or suspicious pulmonary nodule. No pleural effusion or pneumothorax. Upper Abdomen: No acute abnormality.  Cholecystectomy clips. Musculoskeletal: No acute chest wall abnormality. No acute or significant osseous findings. Review of the MIP images confirms the above findings. IMPRESSION: Examination is POSITIVE for saddle pulmonary embolus, with additional BILATERAL segmental pulmonary emboli. CT evidence of right heart strain (RV/LV Ratio = 1.75) consistent with at least submassive (intermediate risk) PE. The presence of right heart strain has been associated with an increased risk of morbidity and mortality. These results will be called to the ordering clinician or representative by the Radiologist Assistant, and communication documented in the PACS or [employer]. Electronically Signed   By: Roanna Banning M.D.   On: 09/18/2022 09:39   DG Chest 1 View  Result Date: 09/18/2022 CLINICAL DATA:  Generalized weakness and cough with shortness of breath EXAM: CHEST  1 VIEW COMPARISON:  08/04/2020 FINDINGS: Stable cardiomediastinal silhouette. Perihilar and peribronchial thickening. No focal consolidation, pleural effusion, or pneumothorax. No displaced rib fractures. IMPRESSION: Bronchitis/reactive airways. Electronically Signed   By: Minerva Fester M.D.   On: 09/18/2022 01:22    Microbiology: Results for  orders placed or performed during the hospital encounter of 09/18/22  SARS Coronavirus 2 by RT PCR (hospital order, performed in [facility] hospital lab) *cepheid single result test* Anterior Nasal Swab     Status: None   Collection Time: 09/18/22  8:05 AM   Specimen: Anterior Nasal Swab  Result Value Ref Range Status   SARS Coronavirus 2 by RT PCR NEGATIVE NEGATIVE Final    Comment: (NOTE) SARS-CoV-2 target nucleic acids are NOT DETECTED.  The SARS-CoV-2 RNA is generally detectable in upper and lower respiratory specimens during the acute phase of infection. The lowest concentration of SARS-CoV-2 viral copies this assay can detect is 250 copies / mL. A negative result does not preclude SARS-CoV-2 infection and should not be used as the sole basis for treatment or other patient management decisions.  A negative result may occur with improper specimen collection / handling, submission of specimen  other than nasopharyngeal swab, presence of viral mutation(s) within the areas targeted by this assay, and inadequate number of viral copies (<250 copies / mL). A negative result must be combined with clinical observations, patient history, and epidemiological information.  Fact Sheet for Patients:   RoadLapTop.co.za  Fact Sheet for Healthcare Providers: http://kim-miller.com/  This test is not yet approved or  cleared by the [place] FDA and has been authorized for detection and/or diagnosis of SARS-CoV-2 by FDA under an Emergency Use Authorization (EUA).  This EUA will remain in effect (meaning this test can be used) for the duration of the COVID-19 declaration under Section 564(b)(1) of the Act, 21 U.S.C. section 360bbb-3(b)(1), unless the authorization is terminated or revoked sooner.  Performed at [facility], [address] [place], [place] [address]   MRSA Next Gen by PCR, Nasal     Status: None   Collection Time:  09/20/22 10:12 AM   Specimen: Nasal Mucosa; Nasal Swab  Result Value Ref Range Status   MRSA by PCR Next Gen NOT DETECTED NOT DETECTED Final    Comment: (NOTE) The GeneXpert MRSA Assay (FDA approved for NASAL specimens only), is one component of a comprehensive MRSA colonization surveillance program. It is not intended to diagnose MRSA infection nor to guide or monitor treatment for MRSA infections. Test performance is not FDA approved in patients less than 36 years old. Performed at [facility], [address] [place], [place] [address]     Labs: CBC: Recent Labs  Lab 09/19/22 0503 09/19/22 1558 09/20/22 0981 09/20/22 0945 09/21/22 0508 09/21/22 1038 09/22/22 0128 09/22/22 0227 09/22/22 0930 09/22/22 1728 09/23/22 0206  WBC 9.6  --  7.6  --  9.3  --   --  12.6*  --   --  10.6*  HGB 6.7* 8.0* 7.1*   < > 7.8*   < > 7.9* 7.9* 8.0* 8.2* 8.4*  HCT 25.7* 29.0* 26.3*  --  28.2*  --   --  29.1*  --   --  30.6*  MCV 67.1*  --  71.1*  --  71.0*  --   --  71.7*  --   --  73.0*  PLT 221  --  217  --  255  --   --  277  --   --  292   < > = values in this interval not displayed.   Basic Metabolic Panel: Recent Labs  Lab 09/19/22 0304 09/19/22 0503 09/20/22 1914 09/21/22 0508 09/22/22 0227 09/23/22 0206  NA  --  136 136 137 136 138  K  --  2.9* 3.5 4.1 3.5 3.6  CL  --  104 104 105 100 101  CO2  --  26 26 25 26 28   GLUCOSE  --  221* 161* 254* 335* 284*  BUN  --  5* <5* <5* 7 12  CREATININE  --  0.58 0.48 0.49 0.60 0.76  CALCIUM  --  7.7* 7.8* 7.8* 8.1* 7.8*  MG 1.8  --   --  1.9 2.0  --   PHOS  --  2.2* 2.8 3.3 3.6 3.3   Liver Function Tests: Recent Labs  Lab 09/19/22 0503 09/20/22 0651 09/21/22 0508 09/22/22 0227 09/23/22 0206  ALBUMIN 2.6* 2.5* 2.8* 2.8* 2.8*   CBG: Recent Labs  Lab 09/22/22 0812 09/22/22 1121 09/22/22 1542 09/22/22 2041 09/23/22 0745  GLUCAP 287* 358* 164* 260* 314*    Discharge time spent: greater than 30  minutes.  Signed:  Marrion Coy, MD [facility] 09/23/2022

## 2022-09-24 DIAGNOSIS — G4734 Idiopathic sleep related nonobstructive alveolar hypoventilation: Secondary | ICD-10-CM | POA: Diagnosis not present

## 2022-09-24 DIAGNOSIS — I2602 Saddle embolus of pulmonary artery with acute cor pulmonale: Secondary | ICD-10-CM | POA: Diagnosis not present

## 2022-09-24 DIAGNOSIS — E1165 Type 2 diabetes mellitus with hyperglycemia: Secondary | ICD-10-CM | POA: Diagnosis not present

## 2022-09-24 DIAGNOSIS — J9601 Acute respiratory failure with hypoxia: Secondary | ICD-10-CM | POA: Diagnosis not present

## 2022-09-24 LAB — GLUCOSE, CAPILLARY
Glucose-Capillary: 140 mg/dL — ABNORMAL HIGH (ref 70–99)
Glucose-Capillary: 228 mg/dL — ABNORMAL HIGH (ref 70–99)
Glucose-Capillary: 245 mg/dL — ABNORMAL HIGH (ref 70–99)
Glucose-Capillary: 208 mg/dL — ABNORMAL HIGH (ref 70–99)

## 2022-09-24 LAB — HEMOGLOBIN: Hemoglobin: 8.4 g/dL — ABNORMAL LOW (ref 12.0–15.0)

## 2022-09-24 MED ORDER — INSULIN GLARGINE-YFGN 100 UNIT/ML ~~LOC~~ SOLN
25.0000 [IU] | Freq: Every day | SUBCUTANEOUS | Status: DC
  Administered 2022-09-24: 25 [IU] via SUBCUTANEOUS
  Filled 2022-09-24 (×2): qty 0.25

## 2022-09-24 NOTE — Progress Notes (Signed)
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  CT scan showed saddle pulmonary emboli, patient was seen by vascular surgery, emergent thrombectomy was performed on 6/6.  Patient also had a significant hypotension, required Levophed in ICU. Echocardiogram showed normal ejection fraction, with increased pulmonary pressure consistent with acute cor pulmonale.  Duplex ultrasound showed bilateral lower extremity DVT. 6/8. Patient had a persistent hypotension, she was placed on hydrocortisone stress dose for adrenal crisis, blood pressure is better. Patient also developed acute hypoxemia, started bronchodilator. 6/9.  Give her lower dose IV Lasix, transferred to regular medical floor. Anticoagulation changed to Eliquis.  6/10: Reduced Solu-Cortef from 3 times daily-> twice daily.  Added insulin Semglee.  Continue to wean off oxygen  Patient ready for discharge, but no place to go.   Active Problems:   Benign essential hypertension   Adjustment disorder with mixed disturbance of emotions and conduct   Acute saddle pulmonary embolism with acute cor pulmonale (HCC)   Iron deficiency anemia   Obstructive cardiovascular shock (HCC)   Adrenal crisis (HCC)   Acute blood loss anemia   Obesity (BMI 30.0-34.9)   Uncontrolled type 2 diabetes mellitus with hyperglycemia (HCC)   History of stroke with current residual effects   Leg DVT (deep venous thromboembolism), acute, bilateral (HCC)   Acute hypoxemic respiratory failure (HCC)   Nocturnal hypoxemia   Assessment and Plan:   Acute  hypoxemic respiratory failure. Nocturnal hypoxia. Most likely secondary to acute cor pulmonale, patient has significant hypoxemia since yesterday, was placed on 10 L oxygen.  She is treated with bronchodilators as she has been producing some mucus.  Procalcitonin level not elevated, no pneumonia suspected.  Patient hypoxemia has improved, but oxygen dropped at nighttime, probable obstructive sleep apnea.  Will try to use oxygen at nighttime.   Saddle pulmonary emboli with acute cor pulmonale. Obstructive cardiogenic shock. Bilateral lower extremity DVT. Patient is status post pulmonary thrombectomy.   Patient has been on heparin drip since admission, she is transitioned to Eliquis on 6/9.   Hypotension. Likely adrenal crisis. Patient states that she was on steroids long-term, recently discontinued.  Her blood pressure was low normally, but more significant in the hospital.  She most likely has adrenal crisis,  She is currently on stress dose steroids.  Cortisol level was drawn after Solu-Cortef, invalid. Patient has transition to prednisone 5 mg daily, will taper down and discontinue in 6 days.   Acute blood loss anemia secondary to vaginal bleeding. Iron deficient anemia. Patient had received a unit of blood transfusion at admission, also received IV iron.  Continue oral iron.  Hemoglobin is more stable. B12 level still borderline, homocystine level normal.  No additional B12 needed.   Type 2 diabetes uncontrolled with hyperglycemia. Insulin glargine dose increased, continue to follow.   History of stroke with extremity weakness. Continue home medicines,   Obesity.  BMI 32.17. Diet and exercise advised.     Subjective: Patient doing better, no complaint today.  Oxygen dropped during sleep, was placed on 2 L oxygen.  Physical Exam: Vitals:   09/23/22 2042 09/24/22  0543 09/24/22 0600 09/24/22 0728  BP: (!) 100/58 (!) 86/54 (!) 121/55 108/65  Pulse: 67 (!) 59 74 (!) 57  Resp:  18 18  18   Temp: 98.5 F (36.9 C) 98.2 F (36.8 C)    TempSrc:      SpO2: 95% 92% 97% 95%  Weight:  88.5 kg    Height:       General exam: Appears calm and comfortable  Respiratory system: Clear to auscultation. Respiratory effort normal. Cardiovascular system: S1 & S2 heard, RRR. No JVD, murmurs, rubs, gallops or clicks. No pedal edema. Gastrointestinal system: Abdomen is nondistended, soft and nontender. No organomegaly or masses felt. Normal bowel sounds heard. Central nervous system: Alert and oriented. No focal neurological deficits. Extremities: Symmetric 5 x 5 power. Skin: No rashes, lesions or ulcers Psychiatry: Judgement and insight appear normal. Mood & affect appropriate.    Data Reviewed:  Lab results reviewed.  Family Communication: None  Disposition: Status is: Inpatient Remains inpatient appropriate because: Unsafe discharge option.     Time spent: 35 minutes  Author: Marrion Coy, MD 09/24/2022 10:48 AM  For on call review [email or web address].

## 2022-09-24 NOTE — Evaluation (Signed)
Physical Therapy Evaluation Patient Details Name: Brandi Myers MRN: [id number] DOB: Feb 01, 1986 Today's Date: 09/24/2022  History of Present Illness  Patient is a 37 y.o female admitted for acute saddle pulmonary embolism. Pmh of benign HTN, adjustmet disorder, acute saddle PE, anemia, Obstructive cardiogenic shock, adrenal crisis, DM2, Hx of stroke, b/l LE DVT, hypoxic respiratory failure.  Clinical Impression  Pt is a pleasant 37 year old female. who was admitted for acute saddle pulmonary embolisms. Pt performs bed mobility with mod I, utilizes bed rails to move in bed. Mod I for transfers to bsc/wheelchair, needs RUE support to safely transfer. Patient was on 3L of O2 via [place] with Spo2 of 94%, placed patient on room air for session. Spo2 was 89% after transferring to bsc and back mod I. Spo2 returned to 92% after rest on RA. Patient performed w/c mobility for 19ft pushing with RUE and pulling with RLE. No complaints of SOB during activities. Patient returned to room with Spo2 92% on RA. Patient placed back on O2 via [place] with 3L. Pt demonstrates deficits with left sided hemiplegia, mobility, and balance. Patient left in bed with bed alarm on and phone/call bell in reach. Patient is currently performing at their baseline level and has no PT needs. Due to difficult home living situation discussed patient home needs with TOC and nursing. Recommend OT screen at this time.      Recommendations for follow up therapy are one component of a multi-disciplinary discharge planning process, led by the attending physician.  Recommendations may be updated based on patient status, additional functional criteria and insurance authorization.  Follow Up Recommendations       Assistance Recommended at Discharge Intermittent Supervision/Assistance  Patient can return home with the following  Help with stairs or ramp for entrance;Assist for transportation;Assistance with cooking/housework;A little help with  bathing/dressing/bathroom;A little help with walking and/or transfers    Equipment Recommendations None recommended by PT  Recommendations for Other Services       Functional Status Assessment Patient has not had a recent decline in their functional status     Precautions / Restrictions Precautions Precautions: Fall Restrictions Weight Bearing Restrictions: No LLE Weight Bearing: Weight bearing as tolerated      Mobility  Bed Mobility Overal bed mobility: Needs Assistance Bed Mobility: Rolling, Sidelying to Sit Rolling: Modified independent (Device/Increase time) Sidelying to sit: Modified independent (Device/Increase time)       General bed mobility comments: Patient able to perform bed mobility with RUE/RLE compensating for the left.    Transfers Overall transfer level: Needs assistance Equipment used:  (1 hand held support on bed rail for stand pivot transfer.) Transfers: Bed to chair/wheelchair/BSC   Stand pivot transfers: Modified independent (Device/Increase time)         General transfer comment: patient able to transfer to bsc and w/c with mod i. utilized bed railings and w/c arm rest for assistance.    Ambulation/Gait               General Gait Details: Unable to asses. Patient uses a w/c regualry.  Stairs            [employer]chant [employer] officer mobility: Yes Wheelchair propulsion: Right lower extremity, Right upper extremity Wheelchair parts: Needs assistance Distance: 160 Wheelchair Assistance Details (indicate cue type and reason): patient able to independently use w/c to go 160 feet, pulling with RLE and pushing the wheel with RUE. no cueing needed.  Modified Rankin (Stroke Patients Only)  Balance Overall balance assessment: Needs assistance Sitting-balance support: Single extremity supported, Feet supported Sitting balance-Leahy Scale: Fair     Standing balance support: Single extremity supported,  During functional activity, Reliant on assistive device for balance Standing balance-Leahy Scale: Fair                               Pertinent Vitals/Pain Pain Assessment Pain Assessment: No/denies pain    Home Living Family/patient expects to be discharged to:: Private residence Living Arrangements: Other relatives (grandmother, uncle) Available Help at Discharge: Other (Comment) (Patient says uncle was helping her prior to admission. Uncle refused to assist post discharge.) Type of Home: House Home Access: Ramped entrance       Home Layout: One level Home Equipment: BSC/3in1;Wheelchair - manual Additional Comments: prior living situation confusing. Patient states she lived with grandmother and uncle who assisted with ADL's. PCP says she was living with a friend who is now hospitalized.    Prior Function Prior Level of Function : Needs assist       Physical Assist : ADLs (physical);Mobility (physical) Mobility (physical): Transfers (Needs assistance getting in/out of shower.) ADLs (physical): IADLs Mobility Comments: Patient able to independently transfer to bsc and w/c. Performs w/c mobility with RUE and RLE. ADLs Comments: needs assistance with grocery shopping, cooking and chores around the house.     Hand Dominance   Dominant Hand: Right    Extremity/Trunk Assessment   Upper Extremity Assessment Upper Extremity Assessment: LUE deficits/detail LUE Deficits / Details: Post CVA no AROM in LUE, increased tone in L hand. LUE Coordination: decreased gross motor    Lower Extremity Assessment Lower Extremity Assessment: LLE deficits/detail LLE Deficits / Details: Patient able to perform hip flexion, straight leg raise, hamstring curl in supine against gravity. Can withstand not resistance. No AROM in L ankle. LLE Coordination: decreased gross motor       Communication   Communication: No difficulties  Cognition Arousal/Alertness: Awake/alert Behavior  During Therapy: WFL for tasks assessed/performed Overall Cognitive Status: Within Functional Limits for tasks assessed                                          General Comments      Exercises     Assessment/Plan    PT Assessment Patient does not need any further PT services  PT Problem List         PT Treatment Interventions      PT Goals (Current goals can be found in the Care Plan section)  Acute Rehab PT Goals Patient Stated Goal: to go home PT Goal Formulation: With patient Time For Goal Achievement: 10/08/22 Potential to Achieve Goals: Fair    Frequency       Co-evaluation               AM-PAC PT "6 Clicks" Mobility  Outcome Measure Help needed turning from your back to your side while in a flat bed without using bedrails?: A Little Help needed moving from lying on your back to sitting on the side of a flat bed without using bedrails?: A Little Help needed moving to and from a bed to a chair (including a wheelchair)?: A Little Help needed standing up from a chair using your arms (e.g., wheelchair or bedside chair)?: None Help needed to walk in hospital room?:  Total Help needed climbing 3-5 steps with a railing? : Total 6 Click Score: 15    End of Session Equipment Utilized During Treatment: Oxygen Activity Tolerance: Patient tolerated treatment well Patient left: in bed;with call bell/phone within reach;with bed alarm set Nurse Communication: Mobility status PT Visit Diagnosis: Unsteadiness on feet (R26.81);Muscle weakness (generalized) (M62.81);Hemiplegia and hemiparesis Hemiplegia - Right/Left: Left Hemiplegia - dominant/non-dominant: Non-dominant Hemiplegia - caused by: Cerebral infarction (right MCA)    Time: 1610-9604 PT Time Calculation (min) (ACUTE ONLY): 32 min   Charges:              Malachi Carl, SPT   Malachi Carl 09/24/2022, 4:44 PM

## 2022-09-24 NOTE — TOC Progression Note (Signed)
Transition of Care [facility]) - Progression Note    Patient Details  Name: Brandi Myers MRN: [id number] Date of Birth: 03/07/1986  [facility] of [facility]) CM/SW Contact  Darleene Cleaver, [place] Phone Number: 09/24/2022, 11:43 PM  Clinical Narrative:     CSW contacted [facility] APS worker Diona Fanti [phone] to inquire if patient was still open to them.  During last hospitalization APS was working on trying to find placement for patient.  Per Verlon Au, she closed the case, because patient moved back in to her grandmother's house.  Verlon Au stated she has built a good relationship with patient, and is going to discuss with her supervisor if patient can be reopened again with APS.  Verlon Au stated that she received a phone call from a group home owner Janace Aris yesterday inquiring if patient was still open to them and if patient was interested in coming to group home.  Per Verlon Au she will contact patient tomorrow and discuss if she is interested in considering moving into the group home.  Verlon Au also stated that patient now gets social security disability every month which can help with paying for placement at group home.  Verlon Au will contact assigned [facility] team member tomorrow after she speaks with patient.  CSW updated assigned TOC team member and provided contact information.  TOC to continue to follow patient's progress throughout discharge planning.   Expected Discharge Plan: Home/Self Care Barriers to Discharge: Continued Medical Work up  Expected Discharge Plan and Services         Expected Discharge Date: 09/23/22                                     Social Determinants of Health (SDOH) Interventions SDOH Screenings   Tobacco Use: High Risk (09/19/2022)    Readmission Risk Interventions    09/22/2022    3:54 PM  Readmission Risk Prevention Plan  Transportation Screening Complete  PCP or Specialist Appt within 5-7 Days Complete  Home Care Screening Complete   Medication Review (RN CM) Complete

## 2022-09-24 NOTE — Progress Notes (Signed)
Patient is alert and oriented x 4.Pt complain of back pain. Narco was given x 2.Patient blood sugar has been  high. Insulin is adjusted. Vital sign with in normal limit.Pt is using oxygen on and off through out the day. Pt awaiting for placement case manager is aware.

## 2022-09-24 NOTE — Progress Notes (Signed)
Mobility Specialist - Progress Note  (On RA) Pre-mobility: HR 72, SpO2 95% During mobility: HR 77, SpO2 90% Post-mobility: HR 74, SPO2 94%   09/24/22 1347  Mobility  Activity Stood at bedside  Level of Assistance Contact guard assist, steadying assist  Assistive Device None  Range of Motion/Exercises Active;Active Assistive  LLE Weight Bearing WBAT  Activity Response Tolerated well  $Mobility charge 1 Mobility  Mobility Specialist Start Time (ACUTE ONLY) 1330  Mobility Specialist Stop Time (ACUTE ONLY) 1346  Mobility Specialist Time Calculation (min) (ACUTE ONLY) 16 min   Pt supine upon entry, utilizing [place]. MS removed Pt from O2. O2 95%, HR 72 while supine, Pt transferred to the EOB indep. Pt STS with supervision, slight drop in O2 to 90% with HR at 77. Pt returned seated, left supine with alarm set and needs within reach, O2 94% on RA. Pt placed back on [place].   Zetta Bills Mobility Specialist 09/24/22 2:05 PM

## 2022-09-25 DIAGNOSIS — D508 Other iron deficiency anemias: Secondary | ICD-10-CM | POA: Diagnosis not present

## 2022-09-25 DIAGNOSIS — E272 Addisonian crisis: Secondary | ICD-10-CM | POA: Diagnosis not present

## 2022-09-25 DIAGNOSIS — I2602 Saddle embolus of pulmonary artery with acute cor pulmonale: Secondary | ICD-10-CM | POA: Diagnosis not present

## 2022-09-25 LAB — GLUCOSE, CAPILLARY
Glucose-Capillary: 128 mg/dL — ABNORMAL HIGH (ref 70–99)
Glucose-Capillary: 212 mg/dL — ABNORMAL HIGH (ref 70–99)
Glucose-Capillary: 208 mg/dL — ABNORMAL HIGH (ref 70–99)
Glucose-Capillary: 219 mg/dL — ABNORMAL HIGH (ref 70–99)

## 2022-09-25 MED ORDER — INSULIN ASPART 100 UNIT/ML IJ SOLN
0.0000 [IU] | Freq: Every day | INTRAMUSCULAR | Status: DC
  Administered 2022-09-25: 2 [IU] via SUBCUTANEOUS
  Administered 2022-09-26: 4 [IU] via SUBCUTANEOUS
  Administered 2022-09-28 – 2022-10-05 (×3): 2 [IU] via SUBCUTANEOUS
  Filled 2022-09-25 (×5): qty 1

## 2022-09-25 MED ORDER — INSULIN ASPART 100 UNIT/ML IJ SOLN
0.0000 [IU] | Freq: Three times a day (TID) | INTRAMUSCULAR | Status: DC
  Administered 2022-09-25 (×2): 3 [IU] via SUBCUTANEOUS
  Administered 2022-09-26: 5 [IU] via SUBCUTANEOUS
  Administered 2022-09-26 – 2022-09-27 (×2): 1 [IU] via SUBCUTANEOUS
  Administered 2022-09-27: 0 [IU] via SUBCUTANEOUS
  Administered 2022-09-28 (×2): 1 [IU] via SUBCUTANEOUS
  Administered 2022-09-29 (×3): 2 [IU] via SUBCUTANEOUS
  Administered 2022-09-30: 1 [IU] via SUBCUTANEOUS
  Administered 2022-09-30: 2 [IU] via SUBCUTANEOUS
  Administered 2022-09-30: 1 [IU] via SUBCUTANEOUS
  Administered 2022-10-01: 2 [IU] via SUBCUTANEOUS
  Administered 2022-10-01 (×2): 1 [IU] via SUBCUTANEOUS
  Administered 2022-10-02: 3 [IU] via SUBCUTANEOUS
  Administered 2022-10-02 – 2022-10-03 (×2): 1 [IU] via SUBCUTANEOUS
  Administered 2022-10-03: 2 [IU] via SUBCUTANEOUS
  Administered 2022-10-04: 3 [IU] via SUBCUTANEOUS
  Administered 2022-10-04: 1 [IU] via SUBCUTANEOUS
  Administered 2022-10-04: 2 [IU] via SUBCUTANEOUS
  Administered 2022-10-05: 1 [IU] via SUBCUTANEOUS
  Administered 2022-10-05: 2 [IU] via SUBCUTANEOUS
  Administered 2022-10-05 – 2022-10-06 (×2): 3 [IU] via SUBCUTANEOUS
  Administered 2022-10-06: 2 [IU] via SUBCUTANEOUS
  Filled 2022-09-25 (×29): qty 1

## 2022-09-25 MED ORDER — PREDNISONE 2.5 MG PO TABS: 2.5000 mg | ORAL_TABLET | Freq: Every day | ORAL | Status: DC

## 2022-09-25 MED ORDER — LIDOCAINE 5 % EX PTCH
1.0000 | MEDICATED_PATCH | CUTANEOUS | Status: DC
  Administered 2022-09-25 – 2022-09-30 (×4): 1 via TRANSDERMAL
  Filled 2022-09-25 (×6): qty 1

## 2022-09-25 MED ORDER — INSULIN ASPART 100 UNIT/ML IJ SOLN
4.0000 [IU] | Freq: Three times a day (TID) | INTRAMUSCULAR | Status: DC
  Administered 2022-09-25 – 2022-10-06 (×34): 4 [IU] via SUBCUTANEOUS
  Filled 2022-09-25 (×34): qty 1

## 2022-09-25 MED ORDER — INSULIN GLARGINE-YFGN 100 UNIT/ML ~~LOC~~ SOLN
35.0000 [IU] | Freq: Every day | SUBCUTANEOUS | Status: DC
  Administered 2022-09-25: 35 [IU] via SUBCUTANEOUS
  Filled 2022-09-25 (×2): qty 0.35

## 2022-09-25 MED ORDER — PREDNISONE 2.5 MG PO TABS
2.5000 mg | ORAL_TABLET | Freq: Every day | ORAL | Status: AC
  Administered 2022-09-26 – 2022-09-28 (×3): 2.5 mg via ORAL
  Filled 2022-09-25 (×3): qty 1

## 2022-09-25 NOTE — Progress Notes (Signed)
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  CT scan showed saddle pulmonary emboli, patient was seen by vascular surgery, emergent thrombectomy was performed on 6/6.  Patient also had a significant hypotension, required Levophed in ICU. Echocardiogram showed normal ejection fraction, with increased pulmonary pressure consistent with acute cor pulmonale.  Duplex ultrasound showed bilateral lower extremity DVT. 6/8. Patient had a persistent hypotension, she was placed on hydrocortisone stress dose for adrenal crisis, blood pressure is better. Patient also developed acute hypoxemia, started bronchodilator. 6/9.  Give her lower dose IV Lasix, transferred to regular medical floor. Anticoagulation changed to Eliquis.  6/10: Reduced Solu-Cortef from 3 times daily-> twice daily.  Added insulin Semglee.  Continue to wean off oxygen  Patient ready for discharge, but no place to go.   Active Problems:   Benign essential hypertension   Adjustment disorder with mixed disturbance of emotions and conduct   Acute saddle pulmonary embolism with acute cor pulmonale (HCC)   Iron deficiency anemia   Obstructive cardiovascular shock (HCC)   Adrenal crisis (HCC)   Acute blood loss anemia   Obesity (BMI 30.0-34.9)   Uncontrolled type 2 diabetes mellitus with hyperglycemia (HCC)   History of stroke with current residual effects   Leg DVT (deep venous thromboembolism), acute, bilateral (HCC)   Acute hypoxemic respiratory failure (HCC)   Nocturnal hypoxemia   Assessment and Plan: Acute hypoxemic  respiratory failure. Nocturnal hypoxia due to obstructive sleep apnea. Most likely secondary to acute cor pulmonale, patient has significant hypoxemia since yesterday, was placed on 10 L oxygen.  She is treated with bronchodilators as she has been producing some mucus.  Procalcitonin level not elevated, no pneumonia suspected.  Patient continued to require oxygen at nighttime, this is secondary to obstructive sleep apnea.  Will continue to monitor, likely prescribe oxygen at night for discharge.  Back pain. Patient has been complaining of back pain, no significant tenderness in the spine.  Ordered Lidoderm patch.   Saddle pulmonary emboli with acute cor pulmonale. Obstructive cardiogenic shock. Bilateral lower extremity DVT. Patient is status post pulmonary thrombectomy.   Patient has been on heparin drip since admission, she is transitioned to Eliquis on 6/9.   Hypotension. Likely adrenal crisis. Patient states that she was on steroids long-term, recently discontinued.  Her blood pressure was low normally, but more significant in the hospital.  She most likely has adrenal crisis,  She is currently on stress dose steroids.  Cortisol level was drawn after Solu-Cortef, invalid. Decrease prednisone to 2.5 mg daily x 3 more days.  Then discontinue.   Acute blood loss anemia secondary to vaginal bleeding. Iron deficient anemia. Patient had received a unit of blood transfusion at admission, also received IV iron.  Continue oral iron.  Hemoglobin is more stable. B12 level still borderline, homocystine level normal.  No additional B12 needed.   Type 2 diabetes uncontrolled with hyperglycemia. Further increase insulin glargine dose.   History of stroke with extremity weakness. Continue home medicines,   Obesity.  BMI 32.17. Diet and exercise advised.        Subjective:  Patient complaining of mid back pain, no short of breath.  Physical Exam: Vitals:   09/24/22 2114 09/25/22 0424  09/25/22 0527 09/25/22 0832  BP: 101/72  106/75 (!) 91/53  Pulse: 65   62  Resp: 18  18 20   Temp: 98.3 F (36.8 C)  98.2 F (36.8 C) 98 F (36.7 C)  TempSrc: Oral  Oral Oral  SpO2: 92%  94% 90%  Weight:  90.4 kg    Height:       General exam: Appears calm and comfortable  Respiratory system: Clear to auscultation. Respiratory effort normal. Cardiovascular system: S1 & S2 heard, RRR. No JVD, murmurs, rubs, gallops or clicks. No pedal edema. Gastrointestinal system: Abdomen is nondistended, soft and nontender. No organomegaly or masses felt. Normal bowel sounds heard. Central nervous system: Alert and oriented. No focal neurological deficits. Extremities: Symmetric 5 x 5 power. Skin: No rashes, lesions or ulcers Psychiatry: Judgement and insight appear normal. Mood & affect appropriate.    Data Reviewed:  Lab results reviewed.  Family Communication: None  Disposition: Status is: Inpatient Remains inpatient appropriate because: Unsafe discharge option.     Time spent: 35 minutes  Author: Marrion Coy, MD 09/25/2022 10:10 AM  For on call review [email or web address].

## 2022-09-25 NOTE — Progress Notes (Signed)
OT Cancellation Note  Patient Details Name: Brandi Myers MRN: [id number] DOB: 1986/01/27   Cancelled Treatment:    Reason Eval/Treat Not Completed: OT screened, no needs identified, will sign off. Pt sleeping, wakes easily. Pt denies difficulty performing ADL and mobility at baseline levels. Agreeable to let MD/RN know if this changes.  Arman Filter., MPH, MS, OTR/L ascom [phone] 09/25/22, 11:57 AM

## 2022-09-25 NOTE — Progress Notes (Signed)
Patient is alert and oriented x 4.Pt complain of back pain. Narco was given x 1.Patient blood sugar has been high. Insulin is adjusted. Vital sign with in normal limit.Pt is using oxygen on and off through out the day. Pt awaiting for placement case manager is aware.

## 2022-09-25 NOTE — TOC Progression Note (Signed)
Transition of Care [facility]) - Progression Note    Patient Details  Name: DAVI ROTAN MRN: [id number] Date of Birth: Mar 06, 1986  [facility]) CM/SW Contact  Allena Katz, LCSW Phone Number: 09/25/2022, 3:39 PM  Clinical Narrative:    CSW spoke with Alinda Money who pt was living with prior to admission. Alinda Money reports pt is unable to come back and that his wife dropped her off at the hospital before and so he doesn't feel like this is an issue and then Alinda Money proceeded to hangup the phone. CSW tried to reach APS social worker Verlon Au but unable to leave a message due to her VM box being full.  Expected Discharge Plan: Home/Self Care Barriers to Discharge: Continued Medical Work up  Expected Discharge Plan and Services         Expected Discharge Date: 09/23/22                                     Social Determinants of Health (SDOH) Interventions SDOH Screenings   Tobacco Use: High Risk (09/19/2022)    Readmission Risk Interventions    09/22/2022    3:54 PM  Readmission Risk Prevention Plan  Transportation Screening Complete  PCP or Specialist Appt within 5-7 Days Complete  Home Care Screening Complete  Medication Review (RN CM) Complete

## 2022-09-25 NOTE — Plan of Care (Signed)
  Problem: Education: Goal: Knowledge of General Education information will improve Description: Including pain rating scale, medication(s)/side effects and non-pharmacologic comfort measures Outcome: Progressing   Problem: Health Behavior/Discharge Planning: Goal: Ability to manage health-related needs will improve Outcome: Progressing   Problem: Clinical Measurements: Goal: Ability to maintain clinical measurements within normal limits will improve Outcome: Progressing Goal: Will remain free from infection Outcome: Progressing Goal: Diagnostic test results will improve Outcome: Progressing Goal: Respiratory complications will improve Outcome: Progressing Goal: Cardiovascular complication will be avoided Outcome: Progressing   Problem: Activity: Goal: Risk for activity intolerance will decrease Outcome: Progressing   Problem: Nutrition: Goal: Adequate nutrition will be maintained Outcome: Progressing   Problem: Coping: Goal: Level of anxiety will decrease Outcome: Progressing   Problem: Elimination: Goal: Will not experience complications related to bowel motility Outcome: Progressing Goal: Will not experience complications related to urinary retention Outcome: Progressing   Problem: Pain Managment: Goal: General experience of comfort will improve Outcome: Progressing   Problem: Safety: Goal: Ability to remain free from injury will improve Outcome: Progressing   Problem: Skin Integrity: Goal: Risk for impaired skin integrity will decrease Outcome: Progressing   Problem: Education: Goal: Ability to describe self-care measures that may prevent or decrease complications (Diabetes Survival Skills Education) will improve Outcome: Progressing Goal: Individualized Educational Video(s) Outcome: Progressing   Problem: Coping: Goal: Ability to adjust to condition or change in health will improve Outcome: Progressing   Problem: Fluid Volume: Goal: Ability to  maintain a balanced intake and output will improve Outcome: Progressing   Problem: Health Behavior/Discharge Planning: Goal: Ability to identify and utilize available resources and services will improve Outcome: Progressing Goal: Ability to manage health-related needs will improve Outcome: Progressing   Problem: Metabolic: Goal: Ability to maintain appropriate glucose levels will improve Outcome: Progressing   Problem: Nutritional: Goal: Maintenance of adequate nutrition will improve Outcome: Progressing Goal: Progress toward achieving an optimal weight will improve Outcome: Progressing   Problem: Skin Integrity: Goal: Risk for impaired skin integrity will decrease Outcome: Progressing   Problem: Tissue Perfusion: Goal: Adequacy of tissue perfusion will improve Outcome: Progressing   Problem: Education: Goal: Knowledge of General Education information will improve Description: Including pain rating scale, medication(s)/side effects and non-pharmacologic comfort measures Outcome: Progressing   

## 2022-09-26 DIAGNOSIS — I2602 Saddle embolus of pulmonary artery with acute cor pulmonale: Secondary | ICD-10-CM | POA: Diagnosis not present

## 2022-09-26 DIAGNOSIS — I82403 Acute embolism and thrombosis of unspecified deep veins of lower extremity, bilateral: Secondary | ICD-10-CM | POA: Diagnosis not present

## 2022-09-26 DIAGNOSIS — E1165 Type 2 diabetes mellitus with hyperglycemia: Secondary | ICD-10-CM | POA: Diagnosis not present

## 2022-09-26 LAB — GLUCOSE, CAPILLARY
Glucose-Capillary: 141 mg/dL — ABNORMAL HIGH (ref 70–99)
Glucose-Capillary: 275 mg/dL — ABNORMAL HIGH (ref 70–99)
Glucose-Capillary: 118 mg/dL — ABNORMAL HIGH (ref 70–99)
Glucose-Capillary: 187 mg/dL — ABNORMAL HIGH (ref 70–99)

## 2022-09-26 MED ORDER — INSULIN GLARGINE-YFGN 100 UNIT/ML ~~LOC~~ SOLN
40.0000 [IU] | Freq: Every day | SUBCUTANEOUS | Status: DC
  Administered 2022-09-26 – 2022-10-06 (×11): 40 [IU] via SUBCUTANEOUS
  Filled 2022-09-26 (×11): qty 0.4

## 2022-09-26 NOTE — Progress Notes (Signed)
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  CT scan showed saddle pulmonary emboli, patient was seen by vascular surgery, emergent thrombectomy was performed on 6/6.  Patient also had a significant hypotension, required Levophed in ICU. Echocardiogram showed normal ejection fraction, with increased pulmonary pressure consistent with acute cor pulmonale.  Duplex ultrasound showed bilateral lower extremity DVT. 6/8. Patient had a persistent hypotension, she was placed on hydrocortisone stress dose for adrenal crisis, blood pressure is better. Patient also developed acute hypoxemia, started bronchodilator. 6/9.  Give her lower dose IV Lasix, transferred to regular medical floor. Anticoagulation changed to Eliquis.  6/10: Reduced Solu-Cortef from 3 times daily-> twice daily.  Added insulin Semglee.  Continue to wean off oxygen  Patient ready for discharge, but no place to go.   Active Problems:   Benign essential hypertension   Adjustment disorder with mixed disturbance of emotions and conduct   Acute saddle pulmonary embolism with acute cor pulmonale (HCC)   Iron deficiency anemia   Obstructive cardiovascular shock (HCC)   Adrenal crisis (HCC)   Acute blood loss anemia   Obesity (BMI 30.0-34.9)   Uncontrolled type 2 diabetes mellitus with hyperglycemia (HCC)   History of stroke with current residual effects   Leg DVT (deep venous thromboembolism), acute, bilateral (HCC)   Acute hypoxemic respiratory failure (HCC)   Nocturnal hypoxemia   Assessment and Plan:  Acute hypoxemic  respiratory failure. Nocturnal hypoxia due to obstructive sleep apnea. Most likely secondary to acute cor pulmonale, patient has significant hypoxemia since yesterday, was placed on 10 L oxygen.  She is treated with bronchodilators as she has been producing some mucus.  Procalcitonin level not elevated, no pneumonia suspected.  Patient continued to require oxygen at nighttime, this is secondary to obstructive sleep apnea.  Will continue to monitor, likely prescribe oxygen at night for discharge.   Back pain. Patient has been complaining of back pain, no significant tenderness in the spine.  Ordered Lidoderm patch.   Saddle pulmonary emboli with acute cor pulmonale. Obstructive cardiogenic shock. Bilateral lower extremity DVT. Patient is status post pulmonary thrombectomy.   Patient has been on heparin drip since admission, she is transitioned to Eliquis on 6/9.   Hypotension. Likely adrenal crisis. Patient states that she was on steroids long-term, recently discontinued.  Her blood pressure was low normally, but more significant in the hospital.  She most likely has adrenal crisis,  She is currently on stress dose steroids.  Cortisol level was drawn after Solu-Cortef, invalid. Decrease prednisone to 2.5 mg daily x 3 more days.  Then discontinue.   Acute blood loss anemia secondary to vaginal bleeding. Iron deficient anemia. Patient had received a unit of blood transfusion at admission, also received IV iron.  Continue oral iron.  Hemoglobin is more stable. B12 level still borderline, homocystine level normal.  No additional B12 needed.   Type 2 diabetes uncontrolled with hyperglycemia. Gradually increase insulin dose.  Currently back to home dose.   History of stroke with extremity weakness. Continue home medicines,   Obesity.  BMI 32.17. Diet and exercise advised.  Subjective:  Patient has no complaint today.  Physical Exam: Vitals:   09/25/22 2104 09/26/22 0315  09/26/22 0527 09/26/22 0818  BP: 111/81  (!) 92/54 91/67  Pulse: 64  65 74  Resp: 18  18 16   Temp: 99.2 F (37.3 C)  98.1 F (36.7 C) 97.8 F (36.6 C)  TempSrc: Oral  Oral   SpO2: (!) 88%  99% 92%  Weight:  90.7 kg    Height:       General exam: Appears calm and comfortable  Respiratory system: Clear to auscultation. Respiratory effort normal. Cardiovascular system: S1 & S2 heard, RRR. No JVD, murmurs, rubs, gallops or clicks. No pedal edema. Gastrointestinal system: Abdomen is nondistended, soft and nontender. No organomegaly or masses felt. Normal bowel sounds heard. Central nervous system: Alert and oriented. No focal neurological deficits. Extremities: Symmetric 5 x 5 power. Skin: No rashes, lesions or ulcers Psychiatry: Judgement and insight appear normal. Mood & affect appropriate.    Data Reviewed:  There are no new results to review at this time.  Family Communication: None  Disposition: Status is: Inpatient Remains inpatient appropriate because: Unsafe discharge option.     Time spent: 35 minutes  Author: Marrion Coy, MD 09/26/2022 11:50 AM  For on call review [email or web address].

## 2022-09-27 DIAGNOSIS — G4734 Idiopathic sleep related nonobstructive alveolar hypoventilation: Secondary | ICD-10-CM | POA: Diagnosis not present

## 2022-09-27 DIAGNOSIS — E1165 Type 2 diabetes mellitus with hyperglycemia: Secondary | ICD-10-CM | POA: Diagnosis not present

## 2022-09-27 DIAGNOSIS — J9601 Acute respiratory failure with hypoxia: Secondary | ICD-10-CM | POA: Diagnosis not present

## 2022-09-27 LAB — CBC
HCT: 33.2 % — ABNORMAL LOW (ref 36.0–46.0)
Hemoglobin: 9 g/dL — ABNORMAL LOW (ref 12.0–15.0)
MCH: 20 pg — ABNORMAL LOW (ref 26.0–34.0)
MCHC: 27.1 g/dL — ABNORMAL LOW (ref 30.0–36.0)
MCV: 73.6 fL — ABNORMAL LOW (ref 80.0–100.0)
Platelets: 271 10*3/uL (ref 150–400)
RBC: 4.51 MIL/uL (ref 3.87–5.11)
RDW: 25.2 % — ABNORMAL HIGH (ref 11.5–15.5)
WBC: 10.4 10*3/uL (ref 4.0–10.5)
nRBC: 0 % (ref 0.0–0.2)

## 2022-09-27 LAB — BASIC METABOLIC PANEL
Anion gap: 6 (ref 5–15)
BUN: 11 mg/dL (ref 6–20)
CO2: 28 mmol/L (ref 22–32)
Calcium: 8.5 mg/dL — ABNORMAL LOW (ref 8.9–10.3)
Chloride: 103 mmol/L (ref 98–111)
Creatinine, Ser: 0.54 mg/dL (ref 0.44–1.00)
GFR, Estimated: >60 mL/min (ref >60)
Glucose, Bld: 121 mg/dL — ABNORMAL HIGH (ref 70–99)
Potassium: 3.5 mmol/L (ref 3.5–5.1)
Sodium: 137 mmol/L (ref 135–145)

## 2022-09-27 LAB — GLUCOSE, CAPILLARY
Glucose-Capillary: 113 mg/dL — ABNORMAL HIGH (ref 70–99)
Glucose-Capillary: 149 mg/dL — ABNORMAL HIGH (ref 70–99)
Glucose-Capillary: 110 mg/dL — ABNORMAL HIGH (ref 70–99)
Glucose-Capillary: 164 mg/dL — ABNORMAL HIGH (ref 70–99)

## 2022-09-27 LAB — MAGNESIUM: Magnesium: 2.3 mg/dL (ref 1.7–2.4)

## 2022-09-27 MED ORDER — POTASSIUM CHLORIDE CRYS ER 20 MEQ PO TBCR
40.0000 meq | EXTENDED_RELEASE_TABLET | Freq: Once | ORAL | Status: AC
  Administered 2022-09-27: 40 meq via ORAL
  Filled 2022-09-27: qty 2

## 2022-09-27 NOTE — NC FL2 (Signed)
[place] MEDICAID FL2 LEVEL OF CARE FORM     IDENTIFICATION  Patient Name: Brandi Myers Birthdate: 1985/11/19 Sex: female Admission Date (Current Location): 09/18/2022  [facility] and [place] Number:  Chiropodist and Address:  [facility], [address] [place], [place] [address]      Provider Number: [id number]  Attending Physician Name and Address:  Marrion Coy, MD  Relative Name and Phone Number:  Vena Rua (Mother) [phone]    Current Level of Care: Hospital Recommended Level of Care: [facility] Prior Approval Number:    Date Approved/Denied:   PASRR Number: [id number]  Discharge Plan: Domiciliary (Rest home)    Current Diagnoses: Patient Active Problem List   Diagnosis Date Noted   Nocturnal hypoxemia 09/24/2022   Acute hypoxemic respiratory failure (HCC) 09/21/2022   Adrenal crisis (HCC) 09/20/2022   Acute blood loss anemia 09/20/2022   Obesity (BMI 30.0-34.9) 09/20/2022   Uncontrolled type 2 diabetes mellitus with hyperglycemia (HCC) 09/20/2022   History of stroke with current residual effects 09/20/2022   Leg DVT (deep venous thromboembolism), acute, bilateral (HCC) 09/20/2022   Obstructive cardiovascular shock (HCC) 09/19/2022   Acute saddle pulmonary embolism with acute cor pulmonale (HCC) 09/18/2022   Iron deficiency anemia 09/18/2022   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   Fever    Acute occlusion of artery of upper extremity due to thrombus (HCC)    Dysphagia, pharyngoesophageal phase    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, without long-term current use of insulin 07/28/2020   Benign essential hypertension 07/28/2020   Thrombocytosis    Leukocytosis    Arterial thrombosis (HCC)    Stroke (cerebrum) (HCC) 07/04/2020   Middle cerebral artery embolism, right  07/04/2020   Cellulitis 02/26/2016    Orientation RESPIRATION BLADDER Height & Weight     Self, Time, Situation, Place  O2 Continent Weight: 198 lb 3.1 oz (89.9 kg) Height:  5\' 5"  (165.1 cm)  BEHAVIORAL SYMPTOMS/MOOD NEUROLOGICAL BOWEL NUTRITION STATUS      Continent    AMBULATORY STATUS COMMUNICATION OF NEEDS Skin    (Pt is wheelchair level at baseline) Verbally Normal                       Personal Care Assistance Level of Assistance  Bathing, Feeding, Dressing Bathing Assistance: Independent Feeding assistance: Independent Dressing Assistance: Independent     Functional Limitations Info  Sight, Hearing, Speech Sight Info: Adequate Hearing Info: Adequate Speech Info: Adequate    SPECIAL CARE FACTORS FREQUENCY                       Contractures Contractures Info: Not present    Additional Factors Info  Code Status, Allergies Code Status Info: FULL Allergies Info: Norco (Hydrocodone-acetaminophen)           Current Medications (09/27/2022):  This is the current hospital active medication list Current Facility-Administered Medications  Medication Dose Route Frequency Provider Last Rate Last Admin   0.9 %  sodium chloride infusion  250 mL Intravenous Continuous Schnier, Latina Craver, MD   Stopped at 09/18/22 1155   albuterol (PROVENTIL) (2.5 MG/3ML) 0.083% nebulizer solution 3 mL  3 mL Inhalation Q6H PRN Marrion Coy, MD   3 mL at 09/22/22 0235   apixaban (ELIQUIS) tablet 10 mg  10 mg Oral BID Mila Merry A,  RPH   10 mg at 09/27/22 1015   Followed by   Melene Muller ON 09/28/2022] apixaban (ELIQUIS) tablet 5 mg  5 mg Oral BID Mila Merry A, RPH       atorvastatin (LIPITOR) tablet 40 mg  40 mg Oral Daily Marrion Coy, MD   40 mg at 09/27/22 1015   cyclobenzaprine (FLEXERIL) tablet 10 mg  10 mg Oral Q8H PRN Marrion Coy, MD   10 mg at 09/26/22 2152   docusate sodium (COLACE) capsule 100 mg  100 mg Oral BID PRN Schnier, Latina Craver, MD       gabapentin (NEURONTIN)  capsule 200 mg  200 mg Oral BID Erin Fulling, MD   200 mg at 09/27/22 1016   insulin aspart (novoLOG) injection 0-5 Units  0-5 Units Subcutaneous QHS Marrion Coy, MD   4 Units at 09/26/22 1724   insulin aspart (novoLOG) injection 0-9 Units  0-9 Units Subcutaneous TID WC Marrion Coy, MD   1 Units at 09/27/22 1353   insulin aspart (novoLOG) injection 4 Units  4 Units Subcutaneous TID WC Marrion Coy, MD   4 Units at 09/27/22 1354   insulin glargine-yfgn (SEMGLEE) injection 40 Units  40 Units Subcutaneous Daily Marrion Coy, MD   40 Units at 09/27/22 1016   iodixanol (VISIPAQUE) 320 MG/ML injection    PRN Schnier, Latina Craver, MD   80 mL at 09/18/22 1833   lidocaine (LIDODERM) 5 % 1 patch  1 patch Transdermal Q24H Marrion Coy, MD   1 patch at 09/26/22 0956   melatonin tablet 2.5 mg  2.5 mg Per Tube QHS Marrion Coy, MD   2.5 mg at 09/26/22 2151   oxyCODONE-acetaminophen (PERCOCET/ROXICET) 5-325 MG per tablet 1 tablet  1 tablet Oral Q4H PRN Marrion Coy, MD   1 tablet at 09/27/22 1016   polyethylene glycol (MIRALAX / GLYCOLAX) packet 17 g  17 g Oral Daily PRN Schnier, Latina Craver, MD       predniSONE (DELTASONE) tablet 2.5 mg  2.5 mg Oral Q breakfast Marrion Coy, MD   2.5 mg at 09/27/22 1015     Discharge Medications: Please see discharge summary for a list of discharge medications.  Relevant Imaging Results:  Relevant Lab Results:   Additional Information SS: [id number]  Allena Katz, LCSW

## 2022-09-27 NOTE — Plan of Care (Signed)
  Problem: Education: Goal: Knowledge of General Education information will improve Description: Including pain rating scale, medication(s)/side effects and non-pharmacologic comfort measures Outcome: Progressing   Problem: Health Behavior/Discharge Planning: Goal: Ability to manage health-related needs will improve Outcome: Progressing   Problem: Clinical Measurements: Goal: Ability to maintain clinical measurements within normal limits will improve Outcome: Progressing Goal: Will remain free from infection Outcome: Progressing Goal: Diagnostic test results will improve Outcome: Progressing Goal: Respiratory complications will improve Outcome: Progressing Goal: Cardiovascular complication will be avoided Outcome: Progressing   Problem: Activity: Goal: Risk for activity intolerance will decrease Outcome: Progressing   Problem: Education: Goal: Ability to describe self-care measures that may prevent or decrease complications (Diabetes Survival Skills Education) will improve Outcome: Progressing Goal: Individualized Educational Video(s) Outcome: Progressing   Problem: Coping: Goal: Ability to adjust to condition or change in health will improve Outcome: Progressing   Problem: Fluid Volume: Goal: Ability to maintain a balanced intake and output will improve Outcome: Progressing   Problem: Health Behavior/Discharge Planning: Goal: Ability to identify and utilize available resources and services will improve Outcome: Progressing Goal: Ability to manage health-related needs will improve Outcome: Progressing   Problem: Metabolic: Goal: Ability to maintain appropriate glucose levels will improve Outcome: Progressing   Problem: Nutritional: Goal: Maintenance of adequate nutrition will improve Outcome: Progressing Goal: Progress toward achieving an optimal weight will improve Outcome: Progressing   Problem: Skin Integrity: Goal: Risk for impaired skin integrity will  decrease Outcome: Progressing   Problem: Tissue Perfusion: Goal: Adequacy of tissue perfusion will improve Outcome: Progressing

## 2022-09-27 NOTE — TOC Progression Note (Addendum)
Transition of Care [facility]) - Progression Note    Patient Details  Name: Brandi Myers MRN: [id number] Date of Birth: Sep 01, 1985  [facility]) CM/SW Contact  Allena Katz, LCSW Phone Number: 09/27/2022, 11:59 AM  Clinical Narrative:   CSW spoke with patient about discharge. She states she needs to be placed that she has no where to go. Pt reports she can do all her own transfers and care but that she doesn't have the funds. Per pt she makes 64 a month and this is not enough to get into a facility.   12:20pm [facility] called spoke with [facility], they reported they do not take medicaid. CSW tried to reach clement but unable to leave a VM.  2:33 [facility] cannot take due to her age but [name]ucker states they may can accommodate. CSW to send referral.   Expected Discharge Plan: Home/Self Care Barriers to Discharge: Continued Medical Work up  Expected Discharge Plan and Services         Expected Discharge Date: 09/23/22                                     Social Determinants of Health (SDOH) Interventions SDOH Screenings   Tobacco Use: High Risk (09/19/2022)    Readmission Risk Interventions    09/22/2022    3:54 PM  Readmission Risk Prevention Plan  Transportation Screening Complete  PCP or Specialist Appt within 5-7 Days Complete  Home Care Screening Complete  Medication Review (RN CM) Complete

## 2022-09-27 NOTE — Progress Notes (Signed)
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  CT scan showed saddle pulmonary emboli, patient was seen by vascular surgery, emergent thrombectomy was performed on 6/6.  Patient also had a significant hypotension, required Levophed in ICU. Echocardiogram showed normal ejection fraction, with increased pulmonary pressure consistent with acute cor pulmonale.  Duplex ultrasound showed bilateral lower extremity DVT. 6/8. Patient had a persistent hypotension, she was placed on hydrocortisone stress dose for adrenal crisis, blood pressure is better. Patient also developed acute hypoxemia, started bronchodilator. 6/9.  Give her lower dose IV Lasix, transferred to regular medical floor. Anticoagulation changed to Eliquis. Patient condition has improved, steroids have reduced to 2.5 mg daily 6/14, blood pressure more stable.  Patient ready for discharge, but no place to go.   Active Problems:   Benign essential hypertension   Adjustment disorder with mixed disturbance of emotions and conduct   Acute saddle pulmonary embolism with acute cor pulmonale (HCC)   Iron deficiency anemia   Obstructive cardiovascular shock (HCC)   Adrenal crisis (HCC)   Acute blood loss anemia   Obesity (BMI 30.0-34.9)   Uncontrolled type 2 diabetes mellitus with hyperglycemia (HCC)   History of stroke with current residual effects   Leg DVT (deep venous thromboembolism), acute, bilateral (HCC)   Acute hypoxemic respiratory failure (HCC)   Nocturnal hypoxemia   Assessment and Plan: Acute hypoxemic respiratory  failure. Nocturnal hypoxia due to obstructive sleep apnea. Most likely secondary to acute cor pulmonale, patient has significant hypoxemia since yesterday, was placed on 10 L oxygen.  She is treated with bronchodilators as she has been producing some mucus.  Procalcitonin level not elevated, no pneumonia suspected.  Patient continued to require oxygen at nighttime, this is secondary to obstructive sleep apnea.  Will continue to monitor, likely prescribe oxygen at night for discharge. Planning to perform overnight oximetry on Monday to qualify for home oxygen use.  Back pain. Patient has been complaining of back pain, no significant tenderness in the spine.  Ordered Lidoderm patch.   Saddle pulmonary emboli with acute cor pulmonale. Obstructive cardiogenic shock. Bilateral lower extremity DVT. Patient is status post pulmonary thrombectomy.   Patient has been on heparin drip since admission, she is transitioned to Eliquis on 6/9.   Hypotension. Likely adrenal crisis. Patient states that she was on steroids long-term, recently discontinued.  Her blood pressure was low normally, but more significant in the hospital.  She most likely has adrenal crisis,  She is currently on stress dose steroids.  Cortisol level was drawn after Solu-Cortef, invalid. Decrease prednisone to 2.5 mg daily x 3 more days.  Then discontinue.   Acute blood loss anemia secondary to vaginal bleeding. Iron deficient anemia. Patient had received a unit of blood transfusion at admission, also received IV iron.  Continue oral iron.  Hemoglobin is more stable. B12 level still borderline, homocystine level normal.  No additional B12 needed.   Type 2 diabetes uncontrolled with hyperglycemia. Glucose better, continue current regimen.   History of stroke with extremity weakness. Continue home medicines,   Obesity.  BMI 32.17. Diet and exercise  advised.        Subjective:  Patient doing well, still require oxygen at  nighttime.  Physical Exam: Vitals:   09/27/22 0253 09/27/22 0318 09/27/22 0507 09/27/22 0733  BP:   (!) 88/62 (!) 82/57  Pulse:  71 61 64  Resp:   16 18  Temp:   97.9 F (36.6 C) 98.7 F (37.1 C)  TempSrc:    Oral  SpO2:  90% (!) 89% (!) 87%  Weight: 89.9 kg     Height:       General exam: Appears calm and comfortable  Respiratory system: Clear to auscultation. Respiratory effort normal. Cardiovascular system: S1 & S2 heard, RRR. No JVD, murmurs, rubs, gallops or clicks. No pedal edema. Gastrointestinal system: Abdomen is nondistended, soft and nontender. No organomegaly or masses felt. Normal bowel sounds heard. Central nervous system: Alert and oriented. No focal neurological deficits. Extremities: Symmetric 5 x 5 power. Skin: No rashes, lesions or ulcers Psychiatry: Judgement and insight appear normal. Mood & affect appropriate.    Data Reviewed:  Review lab results.  Family Communication: None  Disposition: Status is: Inpatient Remains inpatient appropriate because: Unsafe discharge option.     Time spent: 35 minutes  Author: Marrion Coy, MD 09/27/2022 9:25 AM  For on call review [email or web address].

## 2022-09-28 ENCOUNTER — Other Ambulatory Visit: Payer: Self-pay

## 2022-09-28 DIAGNOSIS — E1165 Type 2 diabetes mellitus with hyperglycemia: Secondary | ICD-10-CM | POA: Diagnosis not present

## 2022-09-28 DIAGNOSIS — D62 Acute posthemorrhagic anemia: Secondary | ICD-10-CM | POA: Diagnosis not present

## 2022-09-28 DIAGNOSIS — G4734 Idiopathic sleep related nonobstructive alveolar hypoventilation: Secondary | ICD-10-CM | POA: Diagnosis not present

## 2022-09-28 DIAGNOSIS — J9601 Acute respiratory failure with hypoxia: Secondary | ICD-10-CM | POA: Diagnosis not present

## 2022-09-28 LAB — GLUCOSE, CAPILLARY
Glucose-Capillary: 120 mg/dL — ABNORMAL HIGH (ref 70–99)
Glucose-Capillary: 150 mg/dL — ABNORMAL HIGH (ref 70–99)
Glucose-Capillary: 134 mg/dL — ABNORMAL HIGH (ref 70–99)
Glucose-Capillary: 207 mg/dL — ABNORMAL HIGH (ref 70–99)

## 2022-09-28 MED ORDER — MELATONIN 5 MG PO TABS
2.5000 mg | ORAL_TABLET | Freq: Every day | ORAL | Status: DC
  Administered 2022-09-28 – 2022-10-05 (×8): 2.5 mg via ORAL
  Filled 2022-09-28 (×8): qty 1

## 2022-09-28 NOTE — TOC Progression Note (Addendum)
Transition of Care [facility]) - Progression Note    Patient Details  Name: Brandi Myers MRN: [id number] Date of Birth: May 17, 1985  Transition of Care [facility]) CM/SW Contact  Darolyn Rua, [place] Phone Number: 09/28/2022, 8:03 AM  Clinical Narrative:     CSW called [facility] at [phone]   They report to call [name] directly to follow up on placement at [phone]. CSW called, no answer, unable to leave voicemail as vm is full.   CSW has texted inquiring if patient was accepted for placement, anthony responded requesting for clinicals to be emailed to [email or web address]  CSW has sent clinicals for referral, pending response from [name] if able to accept at this time.  Per MD patient will need nocturnal oxygen at dc, will order ONO.   Expected Discharge Plan: Home/Self Care Barriers to Discharge: Continued Medical Work up  Expected Discharge Plan and Services         Expected Discharge Date: 09/23/22                                     Social Determinants of Health (SDOH) Interventions SDOH Screenings   Food Insecurity: Patient Unable To Answer (09/27/2022)  Housing: High Risk (09/27/2022)  Transportation Needs: Unknown (09/27/2022)  Utilities: Patient Unable To Answer (09/27/2022)  Tobacco Use: High Risk (09/19/2022)    Readmission Risk Interventions    09/22/2022    3:54 PM  Readmission Risk Prevention Plan  Transportation Screening Complete  PCP or Specialist Appt within 5-7 Days Complete  Home Care Screening Complete  Medication Review (RN CM) Complete

## 2022-09-28 NOTE — Progress Notes (Signed)
Overnight Pulse Ox Study started as ordered at this time. Pt using 2 lpm [place] at this time, sp02 93-94% WA, was found on 4 lpm [place] due to dyspnea w/ exertion.

## 2022-09-28 NOTE — Progress Notes (Signed)
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  CT scan showed saddle pulmonary emboli, patient was seen by vascular surgery, emergent thrombectomy was performed on 6/6.  Patient also had a significant hypotension, required Levophed in ICU. Echocardiogram showed normal ejection fraction, with increased pulmonary pressure consistent with acute cor pulmonale.  Duplex ultrasound showed bilateral lower extremity DVT. 6/8. Patient had a persistent hypotension, she was placed on hydrocortisone stress dose for adrenal crisis, blood pressure is better. Patient also developed acute hypoxemia, started bronchodilator. 6/9.  Give her lower dose IV Lasix, transferred to regular medical floor. Anticoagulation changed to Eliquis. Patient condition has improved, steroids have reduced to 2.5 mg daily 6/14, blood pressure more stable.  Patient ready for discharge, but no place to go.   Active Problems:   Benign essential hypertension   Adjustment disorder with mixed disturbance of emotions and conduct   Acute saddle pulmonary embolism with acute cor pulmonale (HCC)   Iron deficiency anemia   Obstructive cardiovascular shock (HCC)   Adrenal crisis (HCC)   Acute blood loss anemia   Obesity (BMI 30.0-34.9)   Uncontrolled type 2 diabetes mellitus with hyperglycemia (HCC)   History of stroke with current residual effects   Leg DVT (deep venous thromboembolism), acute, bilateral (HCC)   Acute hypoxemic respiratory failure (HCC)   Nocturnal hypoxemia   Assessment and Plan: Acute hypoxemic respiratory  failure. Nocturnal hypoxia due to obstructive sleep apnea. Most likely secondary to acute cor pulmonale, patient has significant hypoxemia since yesterday, was placed on 10 L oxygen.  She is treated with bronchodilators as she has been producing some mucus.  Procalcitonin level not elevated, no pneumonia suspected.  Patient continued to require oxygen at nighttime, this is secondary to obstructive sleep apnea.  Will continue to monitor, likely prescribe oxygen at night for discharge. Planning to perform overnight oximetry on Monday to qualify for home oxygen use.   Back pain. Patient has been complaining of back pain, no significant tenderness in the spine.  Ordered Lidoderm patch.   Saddle pulmonary emboli with acute cor pulmonale. Obstructive cardiogenic shock. Bilateral lower extremity DVT. Patient is status post pulmonary thrombectomy.   Patient has been on heparin drip since admission, she is transitioned to Eliquis on 6/9.   Hypotension. Likely adrenal crisis. Patient states that she was on steroids long-term, recently discontinued.  Her blood pressure was low normally, but more significant in the hospital.  She most likely has adrenal crisis,  She is currently on stress dose steroids.  Cortisol level was drawn after Solu-Cortef, invalid. Decrease prednisone to 2.5 mg daily x 3 more days.  Completed today.   Acute blood loss anemia secondary to vaginal bleeding. Iron deficient anemia. Patient had received a unit of blood transfusion at admission, also received IV iron.  Continue oral iron.  Hemoglobin is more stable. B12 level still borderline, homocystine level normal.  No additional B12 needed.   Type 2 diabetes uncontrolled with hyperglycemia. Glucose better, continue current regimen.   History of stroke with extremity weakness. Continue home medicines,   Obesity.  BMI 32.17. Diet and  exercise advised.   Patient condition stable, no change in treatment  plan.     Subjective: Still had a hypoxemia at night, but no symptoms.  Physical Exam: Vitals:   09/27/22 2209 09/28/22 0449 09/28/22 0500 09/28/22 0739  BP: 116/64 94/64  (!) 86/55  Pulse: 66 70  73  Resp: 16 16  16   Temp: 98.5 F (36.9 C) 98.2 F (36.8 C)  98.1 F (36.7 C)  TempSrc:      SpO2: 98% 92%  (!) 88%  Weight:   90.1 kg   Height:       General exam: Appears calm and comfortable  Respiratory system: Clear to auscultation. Respiratory effort normal. Cardiovascular system: S1 & S2 heard, RRR. No JVD, murmurs, rubs, gallops or clicks. No pedal edema. Gastrointestinal system: Abdomen is nondistended, soft and nontender. No organomegaly or masses felt. Normal bowel sounds heard. Central nervous system: Alert and oriented. No focal neurological deficits. Extremities: Symmetric 5 x 5 power. Skin: No rashes, lesions or ulcers Psychiatry: Judgement and insight appear normal. Mood & affect appropriate.    Data Reviewed:  Reviewed lab results.  Family Communication: None  Disposition: Status is: Inpatient Remains inpatient appropriate because: Severity of disease, unsafe discharge plan.     Time spent: 35 minutes  Author: Marrion Coy, MD 09/28/2022 1:59 PM  For on call review [email or web address].

## 2022-09-29 DIAGNOSIS — J9601 Acute respiratory failure with hypoxia: Secondary | ICD-10-CM | POA: Diagnosis not present

## 2022-09-29 DIAGNOSIS — I2602 Saddle embolus of pulmonary artery with acute cor pulmonale: Secondary | ICD-10-CM | POA: Diagnosis not present

## 2022-09-29 DIAGNOSIS — F4325 Adjustment disorder with mixed disturbance of emotions and conduct: Secondary | ICD-10-CM | POA: Diagnosis not present

## 2022-09-29 DIAGNOSIS — D62 Acute posthemorrhagic anemia: Secondary | ICD-10-CM | POA: Diagnosis not present

## 2022-09-29 LAB — GLUCOSE, CAPILLARY
Glucose-Capillary: 173 mg/dL — ABNORMAL HIGH (ref 70–99)
Glucose-Capillary: 162 mg/dL — ABNORMAL HIGH (ref 70–99)
Glucose-Capillary: 155 mg/dL — ABNORMAL HIGH (ref 70–99)
Glucose-Capillary: 174 mg/dL — ABNORMAL HIGH (ref 70–99)
Glucose-Capillary: 186 mg/dL — ABNORMAL HIGH (ref 70–99)

## 2022-09-29 MED ORDER — IBUPROFEN 400 MG PO TABS
400.0000 mg | ORAL_TABLET | Freq: Four times a day (QID) | ORAL | Status: DC | PRN
  Administered 2022-09-29 – 2022-10-03 (×4): 400 mg via ORAL
  Filled 2022-09-29 (×4): qty 1

## 2022-09-29 MED ORDER — KETOROLAC TROMETHAMINE 30 MG/ML IJ SOLN
30.0000 mg | Freq: Once | INTRAMUSCULAR | Status: DC
  Filled 2022-09-29: qty 1

## 2022-09-29 MED ORDER — MIDODRINE HCL 5 MG PO TABS
10.0000 mg | ORAL_TABLET | Freq: Three times a day (TID) | ORAL | Status: DC
  Administered 2022-09-29 – 2022-10-06 (×21): 10 mg via ORAL
  Filled 2022-09-29 (×21): qty 2

## 2022-09-29 MED ORDER — OXYCODONE-ACETAMINOPHEN 5-325 MG PO TABS
1.0000 | ORAL_TABLET | Freq: Three times a day (TID) | ORAL | Status: DC | PRN
  Administered 2022-09-30 – 2022-10-06 (×13): 1 via ORAL
  Filled 2022-09-29 (×13): qty 1

## 2022-09-29 MED ORDER — OXYCODONE-ACETAMINOPHEN 5-325 MG PO TABS: 1.0000 | ORAL_TABLET | ORAL | Status: DC | PRN

## 2022-09-29 NOTE — TOC Progression Note (Signed)
Transition of Care [facility]) - Progression Note    Patient Details  Name: Brandi Myers MRN: [id number] Date of Birth: 07-Jun-1985  Transition of Care [facility]) CM/SW Contact  Allena Katz, LCSW Phone Number: 09/29/2022, 9:34 AM  Clinical Narrative:  Message sent to [facility] to follow up on referral sent.      Expected Discharge Plan: Home/Self Care Barriers to Discharge: Continued Medical Work up  Expected Discharge Plan and Services         Expected Discharge Date: 09/23/22                                     Social Determinants of Health (SDOH) Interventions SDOH Screenings   Food Insecurity: Patient Unable To Answer (09/27/2022)  Housing: High Risk (09/27/2022)  Transportation Needs: Unknown (09/27/2022)  Utilities: Patient Unable To Answer (09/27/2022)  Tobacco Use: High Risk (09/19/2022)    Readmission Risk Interventions    09/22/2022    3:54 PM  Readmission Risk Prevention Plan  Transportation Screening Complete  PCP or Specialist Appt within 5-7 Days Complete  Home Care Screening Complete  Medication Review (RN CM) Complete

## 2022-09-29 NOTE — Progress Notes (Signed)
Pt c/o of  chronic back pain rated 1000 this morning. BP low at 96/66. Dr Sherryll Burger notified of pain and BP; ordered Advil as to not further lower BP. Return to pt room she was asleep. 1200 pt c/o of back pain of 9 BP 89/60. Dr Sherryll Burger notified. One time order of Toradol order. Pt refused.

## 2022-09-29 NOTE — Progress Notes (Signed)
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  CT scan showed saddle pulmonary emboli, patient was seen by vascular surgery, emergent thrombectomy was performed on 6/6.  Patient also had a significant hypotension, required Levophed in ICU. Echocardiogram showed normal ejection fraction, with increased pulmonary pressure consistent with acute cor pulmonale.  Duplex ultrasound showed bilateral lower extremity DVT. 6/8. Patient had a persistent hypotension, she was placed on hydrocortisone stress dose for adrenal crisis, blood pressure is better. Patient also developed acute hypoxemia, started bronchodilator. 6/9.  Give her lower dose IV Lasix, transferred to regular medical floor. Anticoagulation changed to Eliquis. Patient condition has improved, steroids have reduced to 2.5 mg daily 6/14, blood pressure more stable.  Patient ready for discharge, but no place to go.  Assessment and Plan:  Acute hypoxemic respiratory failure. Nocturnal hypoxia due to obstructive sleep apnea. Most likely secondary to acute cor pulmonale, patient has significant hypoxemia on admission was placed on 10 L oxygen.  She is treated with bronchodilators as she has been producing some mucus.  Procalcitonin level not elevated, no pneumonia.  Patient continued to require oxygen at nighttime, this could be secondary to obstructive sleep apnea.  May require oxygen at discharge She remains on 2 L oxygen by nasal cannula   Back pain. Patient has been complaining of back pain, no significant tenderness in the spine.   Continue Lidoderm patch.  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 Obstructive cardiogenic shock. Bilateral lower extremity DVT. Patient is status post pulmonary thrombectomy.   Patient has been on heparin drip since admission, she is transitioned to Eliquis on 6/9.   Hypotension. Likely adrenal crisis. Patient states that she was on steroids long-term, recently discontinued.  Her blood pressure was low normally, but more significant in the hospital.  She most likely has adrenal crisis,  She was on stress dose steroids.  Cortisol level was drawn after Solu-Cortef, invalid. Decrease prednisone to 2.5 mg daily x 3 more days.  She has completed this on 6/16   Acute blood loss anemia secondary to vaginal bleeding. Iron deficient anemia. Patient had received a unit of blood transfusion at admission, also received IV iron.  Continue oral iron.  Hemoglobin is more stable. B12 level still borderline, homocystine level normal.  No additional B12 needed.   Type 2 diabetes uncontrolled with hyperglycemia. Glucose better, continue current regimen.   History of stroke with extremity weakness. Continue home medicines,   Obesity.  BMI 32.17. Diet and exercise advised.   Patient condition stable, no change in treatment plan.     Subjective: Reports some back pain  Physical Exam: Vitals:   09/29/22 0442 09/29/22 0736 09/29/22 0800 09/29/22 0814  BP: (!) 89/52 96/66    Pulse: 76 71    Resp: 18 16    Temp: 99 F (37.2 C) 98.1 F (36.7 C)    TempSrc: Oral Oral    SpO2: 94% (!) 88% (!) 85% (!) 88%  Weight:  Height:       General exam: Appears calm and comfortable  Respiratory system: Clear to auscultation. Respiratory effort normal. Cardiovascular system: S1 & S2 heard, RRR. No JVD, murmurs, rubs, gallops or clicks. No pedal edema. Gastrointestinal system: Abdomen is nondistended, soft  and nontender. No organomegaly or masses felt. Normal bowel sounds heard. Central nervous system: Alert and oriented. No focal neurological deficits. Extremities: Symmetric 5 x 5 power. Skin: No rashes, lesions or ulcers Psychiatry: Judgement and insight appear normal. Mood & affect appropriate.  Data Reviewed:  There are no new results to review at this time.  Family Communication: None at bedside  Disposition: Status is: Inpatient Remains inpatient appropriate because: Unsafe discharge plan, medically stable  Planned Discharge Destination: Skilled nursing facility   DVT prophylaxis-Eliquis Time spent: 25 minutes  Author: Delfino Lovett, MD 09/29/2022 11:31 AM  For on call review [email or web address].

## 2022-09-29 NOTE — Progress Notes (Signed)
Overnight Pulse Ox Study report generated and in patients chart.

## 2022-09-30 DIAGNOSIS — F4325 Adjustment disorder with mixed disturbance of emotions and conduct: Secondary | ICD-10-CM | POA: Diagnosis not present

## 2022-09-30 DIAGNOSIS — J9601 Acute respiratory failure with hypoxia: Secondary | ICD-10-CM | POA: Diagnosis not present

## 2022-09-30 DIAGNOSIS — D62 Acute posthemorrhagic anemia: Secondary | ICD-10-CM | POA: Diagnosis not present

## 2022-09-30 DIAGNOSIS — I2602 Saddle embolus of pulmonary artery with acute cor pulmonale: Secondary | ICD-10-CM | POA: Diagnosis not present

## 2022-09-30 LAB — GLUCOSE, CAPILLARY
Glucose-Capillary: 139 mg/dL — ABNORMAL HIGH (ref 70–99)
Glucose-Capillary: 157 mg/dL — ABNORMAL HIGH (ref 70–99)
Glucose-Capillary: 142 mg/dL — ABNORMAL HIGH (ref 70–99)
Glucose-Capillary: 145 mg/dL — ABNORMAL HIGH (ref 70–99)

## 2022-09-30 MED ORDER — SODIUM CHLORIDE 0.9 % IV SOLN: INTRAVENOUS | Status: DC

## 2022-09-30 NOTE — Progress Notes (Signed)
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  CT scan showed saddle pulmonary emboli, patient was seen by vascular surgery, emergent thrombectomy was performed on 6/6.  Patient also had a significant hypotension, required Levophed in ICU. Echocardiogram showed normal ejection fraction, with increased pulmonary pressure consistent with acute cor pulmonale.  Duplex ultrasound showed bilateral lower extremity DVT. 6/8. Patient had a persistent hypotension, she was placed on hydrocortisone stress dose for adrenal crisis, blood pressure is better. Patient also developed acute hypoxemia, started bronchodilator. 6/9.  Give her lower dose IV Lasix, transferred to regular medical floor. Anticoagulation changed to Eliquis. Patient condition has improved, steroids have reduced to 2.5 mg daily 6/14, blood pressure more stable.  Patient ready for discharge, but no place to go.  Assessment and Plan:  Acute hypoxemic respiratory failure. Nocturnal hypoxia due to obstructive sleep apnea. Most likely secondary to acute cor pulmonale, patient has significant hypoxemia on admission was placed on 10 L oxygen.  She is treated with bronchodilators as she has been producing some mucus.  Procalcitonin level not elevated, no pneumonia.  Patient continued to require oxygen at nighttime, this could be secondary to obstructive sleep apnea.  May require oxygen at discharge She is on room air   Back pain. Patient has been complaining of back pain, no significant tenderness in the spine.  Continue Lidoderm patch.   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 Obstructive cardiogenic shock. Bilateral lower extremity DVT. Patient is status post pulmonary thrombectomy.   Patient has been on heparin drip since admission, she is transitioned to Eliquis on 6/9.   Hypotension. Likely adrenal crisis. Patient states that she was on steroids long-term, recently discontinued.  Her blood pressure was low normally, but more significant in the hospital.  She most likely has adrenal crisis,  She was on stress dose steroids.  Cortisol level was drawn after Solu-Cortef, invalid.  Midodrine added on 6/17 Decrease prednisone to 2.5 mg daily x 3 more days.  She has completed this on 6/16   Acute blood loss anemia secondary to vaginal bleeding. Iron deficient anemia. Patient had received a unit of blood transfusion at admission, also received IV iron.  Continue oral iron.  Hemoglobin is more stable. B12 level still borderline, homocystine level normal.  No additional B12 needed.   Type 2 diabetes uncontrolled with hyperglycemia. Glucose better, continue current regimen.   History of stroke with extremity weakness. Continue home medicines,   Obesity.  BMI 32.17. Diet and exercise advised.   Patient condition stable, no change in treatment plan.     Subjective: No new issues.  Physical Exam: Vitals:   09/29/22 1636 09/29/22 2025 09/30/22 0433 09/30/22 0744  BP: 96/64 (!) 94/51 (!) 99/59 (!) 90/51  Pulse: 86 73 72 68  Resp: 16 18 18 17   Temp: 98.2 F (36.8 C) 98.6 F (37 C) 98.4 F (36.9 C) 98 F (36.7 C)  TempSrc: Oral Oral Oral   SpO2: 98% 98% 92% 97%  Weight:  Height:       General exam: Appears calm and comfortable  Respiratory system: Clear to auscultation. Respiratory effort normal. Cardiovascular system: S1 & S2 heard, RRR. No JVD, murmurs, rubs, gallops or clicks. No pedal  edema. Gastrointestinal system: Abdomen is nondistended, soft and nontender. No organomegaly or masses felt. Normal bowel sounds heard. Central nervous system: Alert and oriented. No focal neurological deficits. Extremities: Symmetric 5 x 5 power. Skin: No rashes, lesions or ulcers Psychiatry: Judgement and insight appear normal. Mood & affect appropriate.  Data Reviewed:  There are no new results to review at this time.  Family Communication: None at bedside  Disposition: Status is: Inpatient Remains inpatient appropriate because: Medically stable, waiting for placement  Planned Discharge Destination: Rehab   DVT prophylaxis-Eliquis Time spent: 15 minutes  Author: Delfino Lovett, MD 09/30/2022 1:08 PM  For on call review [email or web address].

## 2022-09-30 NOTE — TOC Progression Note (Signed)
Transition of Care [facility]) - Progression Note    Patient Details  Name: Brandi Myers MRN: [id number] Date of Birth: 10/13/1985  Transition of [facility]) CM/SW Contact  Allena Katz, LCSW Phone Number: 09/30/2022, 9:44 AM  Clinical Narrative:   [facility] reviewing pt[facility]     Expected Discharge Plan: Home/Self Care Barriers to Discharge: Continued Medical Work up  Expected Discharge Plan and Services         Expected Discharge Date: 09/23/22                                     Social Determinants of Health (SDOH) Interventions SDOH Screenings   Food Insecurity: Patient Unable To Answer (09/27/2022)  Housing: High Risk (09/27/2022)  Transportation Needs: Unknown (09/27/2022)  Utilities: Patient Unable To Answer (09/27/2022)  Tobacco Use: High Risk (09/19/2022)    Readmission Risk Interventions    09/22/2022    3:54 PM  Readmission Risk Prevention Plan  Transportation Screening Complete  PCP or Specialist Appt within 5-7 Days Complete  Home Care Screening Complete  Medication Review (RN CM) Complete

## 2022-09-30 NOTE — Progress Notes (Signed)
Arrived to room for IV team consult.  Pt is eating and requested I return after her meal.  Brandi RN notified via secure chat.

## 2022-10-01 DIAGNOSIS — J9601 Acute respiratory failure with hypoxia: Secondary | ICD-10-CM | POA: Diagnosis not present

## 2022-10-01 LAB — BASIC METABOLIC PANEL
Anion gap: 7 (ref 5–15)
BUN: 12 mg/dL (ref 6–20)
CO2: 23 mmol/L (ref 22–32)
Calcium: 8.4 mg/dL — ABNORMAL LOW (ref 8.9–10.3)
Chloride: 106 mmol/L (ref 98–111)
Creatinine, Ser: 0.53 mg/dL (ref 0.44–1.00)
GFR, Estimated: >60 mL/min (ref >60)
Glucose, Bld: 145 mg/dL — ABNORMAL HIGH (ref 70–99)
Potassium: 3.8 mmol/L (ref 3.5–5.1)
Sodium: 136 mmol/L (ref 135–145)

## 2022-10-01 LAB — CBC
HCT: 34.3 % — ABNORMAL LOW (ref 36.0–46.0)
Hemoglobin: 9.3 g/dL — ABNORMAL LOW (ref 12.0–15.0)
MCH: 19.9 pg — ABNORMAL LOW (ref 26.0–34.0)
MCHC: 27.1 g/dL — ABNORMAL LOW (ref 30.0–36.0)
MCV: 73.4 fL — ABNORMAL LOW (ref 80.0–100.0)
Platelets: 322 10*3/uL (ref 150–400)
RBC: 4.67 MIL/uL (ref 3.87–5.11)
RDW: 24.6 % — ABNORMAL HIGH (ref 11.5–15.5)
WBC: 8.5 10*3/uL (ref 4.0–10.5)
nRBC: 0 % (ref 0.0–0.2)

## 2022-10-01 LAB — GLUCOSE, CAPILLARY
Glucose-Capillary: 126 mg/dL — ABNORMAL HIGH (ref 70–99)
Glucose-Capillary: 128 mg/dL — ABNORMAL HIGH (ref 70–99)
Glucose-Capillary: 160 mg/dL — ABNORMAL HIGH (ref 70–99)
Glucose-Capillary: 138 mg/dL — ABNORMAL HIGH (ref 70–99)

## 2022-10-01 NOTE — TOC Progression Note (Signed)
Transition of Care [facility]) - Progression Note    Patient Details  Name: Brandi Myers MRN: [id number] Date of Birth: [birth year]  [facility] Care [facility]) CM/SW Contact  Allena Katz, LCSW Phone Number: 10/01/2022, 9:58 AM  Clinical Narrative:   [facility] is currently assessing medications and their cost to see if they are able to take patient.     Expected Discharge Plan: Home/Self Care Barriers to Discharge: Continued Medical Work up  Expected Discharge Plan and Services         Expected Discharge Date: 09/23/22                                     Social Determinants of Health (SDOH) Interventions SDOH Screenings   Food Insecurity: Patient Unable To Answer (09/27/2022)  Housing: High Risk (09/27/2022)  Transportation Needs: Unknown (09/27/2022)  Utilities: Patient Unable To Answer (09/27/2022)  Tobacco Use: High Risk (09/19/2022)    Readmission Risk Interventions    09/22/2022    3:54 PM  Readmission Risk Prevention Plan  Transportation Screening Complete  PCP or Specialist Appt within 5-7 Days Complete  Home Care Screening Complete  Medication Review (RN CM) Complete

## 2022-10-01 NOTE — Progress Notes (Signed)
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  CT scan showed saddle pulmonary emboli, patient was seen by vascular surgery, emergent thrombectomy was performed on 6/6.  Patient also had a significant hypotension, required Levophed in ICU. Echocardiogram showed normal ejection fraction, with increased pulmonary pressure consistent with acute cor pulmonale.  Duplex ultrasound showed bilateral lower extremity DVT. 6/8. Patient had a persistent hypotension, she was placed on hydrocortisone stress dose for adrenal crisis, blood pressure is better. Patient also developed acute hypoxemia, started bronchodilator. 6/9.  Give her lower dose IV Lasix, transferred to regular medical floor. Anticoagulation changed to Eliquis. Patient condition has improved, steroids have reduced to 2.5 mg daily 6/14, blood pressure more stable.  Patient ready for discharge, but no place to go.  Assessment and Plan:  Acute hypoxemic respiratory failure. Nocturnal hypoxia due to obstructive sleep apnea. Most likely secondary to acute cor pulmonale, patient has significant hypoxemia on admission was placed on 10 L oxygen.  She is treated with bronchodilators as she has been producing some mucus.  Procalcitonin level not elevated, no pneumonia.  Patient continued to require oxygen at nighttime, this could be secondary to obstructive sleep apnea.    She is stable on room air   Back pain. Patient has been complaining of back pain, no significant tenderness in the spine.  Continue Lidoderm patch.  Due to her hypotension  I have ordered ibuprofen for now she also has as needed Percocet   Saddle pulmonary emboli with acute cor pulmonale. Obstructive cardiogenic shock. Bilateral lower extremity DVT. Patient is status post pulmonary thrombectomy.   Patient had been on heparin drip since admission, she is transitioned to Eliquis on 6/9.   Hypotension. Likely adrenal crisis. Patient states that she was on steroids long-term, recently discontinued.  Her blood pressure was low normally, but more significant in the hospital.  She most likely has adrenal crisis,  She was on stress dose steroids.  Cortisol level was drawn after Solu-Cortef, invalid.  Midodrine added on 6/17 Now weaned off prednisone   Acute blood loss anemia secondary to vaginal bleeding. Iron deficient anemia. Patient had received a unit of blood transfusion at admission, also received IV iron.  Continue oral iron.  Hemoglobin is more stable. B12 level still borderline, homocystine level normal.  No additional B12 needed.   Type 2 diabetes uncontrolled with hyperglycemia. Glucose wnl, continue current regimen.   History of stroke with extremity weakness. Continue home medicines,   Obesity.  BMI 32.17. Diet and exercise advised.   Patient condition stable, no change in treatment plan.     Subjective: No new issues. Tolerating diet. No dyspnea  Physical Exam: Vitals:   09/30/22 1625 09/30/22 2031 10/01/22 0500 10/01/22 0749  BP: (!) 84/53 (!) 100/59  98/72  Pulse: 65 65  65  Resp:  18  16  Temp:  98.7 F (37.1 C)  98.1 F (36.7 C)  TempSrc:    Oral  SpO2:  95%  97%  Weight:   90.6 kg   Height:       General exam: Appears calm and  comfortable  Respiratory system: Clear to auscultation. Respiratory effort normal. Cardiovascular system: S1 & S2 heard, RRR. No JVD, murmurs, rubs, gallops or clicks. No pedal edema. Gastrointestinal system: Abdomen is nondistended, soft and nontender. No organomegaly or masses felt. Normal bowel  sounds heard. Central nervous system: Alert and oriented. No focal neurological deficits. Extremities: Symmetric 5 x 5 power. Skin: No rashes, lesions or ulcers Psychiatry: Judgement and insight appear normal. Mood & affect appropriate.  Data Reviewed:  There are no new results to review at this time.  Family Communication: None at bedside  Disposition: Status is: Inpatient Remains inpatient appropriate because: Medically stable, waiting for placement  Planned Discharge Destination: Rehab   DVT prophylaxis-Eliquis Time spent: 25 minutes  Author: Silvano Bilis, MD 10/01/2022 2:56 PM  For on call review [email or web address].

## 2022-10-02 ENCOUNTER — Inpatient Hospital Stay: Payer: Medicaid Other

## 2022-10-02 LAB — GLUCOSE, CAPILLARY
Glucose-Capillary: 112 mg/dL — ABNORMAL HIGH (ref 70–99)
Glucose-Capillary: 203 mg/dL — ABNORMAL HIGH (ref 70–99)
Glucose-Capillary: 149 mg/dL — ABNORMAL HIGH (ref 70–99)
Glucose-Capillary: 156 mg/dL — ABNORMAL HIGH (ref 70–99)

## 2022-10-02 LAB — SARS CORONAVIRUS 2 BY RT PCR: SARS Coronavirus 2 by RT PCR: NEGATIVE

## 2022-10-02 NOTE — TOC Progression Note (Signed)
Transition of Care [facility]) - Progression Note    Patient Details  Name: Brandi Myers MRN: [id number] Date of Birth: [birth year]  Transition [facility]) CM/SW Contact  Allena Katz, LCSW Phone Number: 10/02/2022, 2:07 PM  Clinical Narrative:   CSW still waiting for [facility] to say if they are able to take patient.     Expected Discharge Plan: Home/Self Care Barriers to Discharge: Continued Medical Work up  Expected Discharge Plan and Services         Expected Discharge Date: 09/23/22                                     Social Determinants of Health (SDOH) Interventions SDOH Screenings   Food Insecurity: Patient Unable To Answer (09/27/2022)  Housing: High Risk (09/27/2022)  Transportation Needs: Unknown (09/27/2022)  Utilities: Patient Unable To Answer (09/27/2022)  Tobacco Use: High Risk (09/19/2022)    Readmission Risk Interventions    09/22/2022    3:54 PM  Readmission Risk Prevention Plan  Transportation Screening Complete  PCP or Specialist Appt within 5-7 Days Complete  Home Care Screening Complete  Medication Review (RN CM) Complete

## 2022-10-02 NOTE — Progress Notes (Signed)
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  CT scan showed saddle pulmonary emboli, patient was seen by vascular surgery, emergent thrombectomy was performed on 6/6.  Patient also had a significant hypotension, required Levophed in ICU. Echocardiogram showed normal ejection fraction, with increased pulmonary pressure consistent with acute cor pulmonale.  Duplex ultrasound showed bilateral lower extremity DVT. 6/8. Patient had a persistent hypotension, she was placed on hydrocortisone stress dose for adrenal crisis, blood pressure is better. Patient also developed acute hypoxemia, started bronchodilator. 6/9.  Give her lower dose IV Lasix, transferred to regular medical floor. Anticoagulation changed to Eliquis. Patient condition has improved, steroids have reduced to 2.5 mg daily 6/14, blood pressure more stable.  Patient ready for discharge, but no place to go.  Assessment and Plan:  Acute hypoxemic respiratory failure. Nocturnal hypoxia likely due to obstructive sleep apnea. Acute respiratory failure most likely secondary to acute cor pulmonale, patient hadsignificant hypoxemia on admission was placed on 10 L oxygen.   Procalcitonin level not elevated, no pneumonia.  Patient continued to require oxygen at nighttime, this could be secondary to obstructive sleep apnea.    She is stable on room air during the day  Cough Nursing reports this morning though patient denies, lungs clear - checking covid, rvp, cxr  Disposition Patient has capacity. Will ask pt/ot to re-eval today,  question whether safe to discharge to shelter as we have not been able to locate residential care for the patient.   Back pain. Patient has been complaining of back pain, no significant tenderness in the spine.  Continue Lidoderm patch.  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 Obstructive cardiogenic shock. Bilateral lower extremity DVT. Patient is status post pulmonary thrombectomy.   Patient had been on heparin drip since admission, she was transitioned to Eliquis on 6/9.   Hypotension. Likely adrenal crisis. Patient states that she was on steroids long-term, recently discontinued.  Her blood pressure was low normally, but more significant in the hospital.  She most likely has adrenal crisis,  She was on stress dose steroids.  Cortisol level was drawn after Solu-Cortef, invalid.  Midodrine added on 6/17 Now weaned off prednisone   Acute blood loss anemia secondary to vaginal bleeding. Iron deficient anemia. Patient had received a unit of blood transfusion at admission, also received IV iron.  Continue oral iron.  Hemoglobin is more stable. B12 level still borderline, homocystine level normal.  No additional B12 needed.   Type 2 diabetes uncontrolled with hyperglycemia. Glucose wnl, continue current regimen.   History of stroke with extremity weakness. Continue home medicines,   Obesity.  BMI 32.17. Diet and exercise advised.   Patient condition stable, no change in treatment plan.     Subjective: No new issues. Tolerating diet. No dyspnea. Rn reports cough this morning, patient denies  Physical Exam: Vitals:   10/02/22 0408 10/02/22 0500 10/02/22 0730 10/02/22 0927  BP: 95/60  (!) 86/55 95/62  Pulse: 90  73 79  Resp: 16  18   Temp: 98.4 F (36.9 C)  98 F (36.7 C)   TempSrc: Oral     SpO2: 100%  95%   Weight:  88.5 kg    Height:       General exam: Appears calm and  comfortable  Respiratory system: Clear to auscultation. Respiratory effort normal. Cardiovascular system: S1 & S2 heard, RRR. No JVD, murmurs, rubs, gallops or clicks. No pedal edema. Gastrointestinal system: Abdomen is nondistended, soft and nontender. No organomegaly or masses felt. Normal bowel sounds heard. Central nervous system: Alert and oriented x4. left-sided weakness Extremities: weakness left side Skin: No rashes, lesions or ulcers Psychiatry:  Affect flat Data Reviewed:  There are no new results to review at this time.  Family Communication: None at bedside  Disposition: Status is: Inpatient Remains inpatient appropriate because: Medically stable, waiting for placement  Planned Discharge Destination: residential care vs shelter   DVT prophylaxis-Eliquis Time spent: 25 minutes  Author: Silvano Bilis, MD 10/02/2022 9:35 AM  For on call review [email or web address].

## 2022-10-02 NOTE — Evaluation (Addendum)
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 Found to have saddle pulmonary emboli s/p thrombectomy. PT asked to re-evaluate to determine if safe to discharge to homeless shelter.   Clinical Impression  Patient is agreeable to PT assessment. She reports routinely getting out of bed to the bed side commode without physical assistance from staff. She is Mod I with bed mobility and transfers to and from wheelchair from various surfaces including bed and toilet. She is Mod I with propelling the wheelchair using RUE and RLE due to chronic left side weakness, with good safety awareness demonstrated. She can navigate forward, backward, around obstacles, and in/out of bathroom without difficulty. No shortness of breath noted with activity.  The patient feels she is at her baseline level of functional independence and is Mod I with all mobility. No acute PT needs at this time. She has a wheelchair and bed side commode in place per her report with no additional DME recommended at this time.    Recommendations for follow up therapy are one component of a multi-disciplinary discharge planning process, led by the attending physician.  Recommendations may be updated based on patient status, additional functional criteria and insurance authorization.  Follow Up Recommendations       Assistance Recommended at Discharge Set up Supervision/Assistance  Patient can return home with the following  Help with stairs or ramp for entrance;Assist for transportation;Assistance with cooking/housework;A little help with  bathing/dressing/bathroom    Equipment Recommendations None recommended by PT  Recommendations for Other Services       Functional Status Assessment Patient has not had a recent decline in their functional status     Precautions / Restrictions Precautions Precautions: Fall Restrictions Weight Bearing Restrictions: No LUE Weight Bearing: Weight bearing as tolerated LLE Weight Bearing: Weight bearing as tolerated      Mobility  Bed Mobility Overal bed mobility: Modified Independent                  Transfers Overall transfer level: Modified independent Equipment used:  (intermittently using railing in bathroom for toilet transfers) Transfers: Bed to chair/wheelchair/BSC   Stand pivot transfers: Modified independent (Device/Increase time)         General transfer comment: multiple transfers performed to various surfaces including bed to and from wheelchair, wheelchair to and from toilet. patient has been routinely getting to and from the bed side commode in her room without physical assistance    Ambulation/Gait                  Administrator mobility: Yes Wheelchair propulsion: Right lower extremity, Right upper extremity Wheelchair parts: Independent Distance: 175 Wheelchair Assistance Details (indicate cue type and reason): patient propelled wheelchair using RUE and RLE without assistance required. she can go forwards, backwards, and navigate into and out of bathroom and around obstacles. she intermittently leaves the left wheelchair break unlocked with transfers per her preference.  Modified Rankin (Stroke Patients Only)       Balance Overall balance assessment: Needs assistance Sitting-balance support: Feet supported, No  upper extremity supported Sitting balance-Leahy Scale: Fair     Standing balance support: Single extremity supported Standing balance-Leahy Scale: Fair Standing  balance comment: using railing in bathroom for support with donning/doffing mesh underwear                             Pertinent Vitals/Pain Pain Assessment Pain Assessment: No/denies pain    Home Living Family/patient expects to be discharged to:: Private residence Living Arrangements: Non-relatives/Friends   Type of Home: House Home Access: Ramped entrance           Additional Comments: patient is no longer able to return to prior living arragements    Prior Function Prior Level of Function : Needs assist             Mobility Comments: independent with transfers to and from wheelchair/BSC. can propel wheelchair without assistance using RUE and RLE mostly ADLs Comments: assist required with grocery shopping, household chores. can dress and bath herself independently     Hand Dominance        Extremity/Trunk Assessment   Upper Extremity Assessment Upper Extremity Assessment: LUE deficits/detail LUE Deficits / Details: residual weakness from prior stroke    Lower Extremity Assessment Lower Extremity Assessment: LLE deficits/detail LLE Deficits / Details: residual weakness from prior stroke       Communication   Communication: No difficulties  Cognition Arousal/Alertness: Awake/alert Behavior During Therapy: WFL for tasks assessed/performed Overall Cognitive Status: Within Functional Limits for tasks assessed                                          General Comments General comments (skin integrity, edema, etc.): patient reports she feels she is at her baseline level of functional mobility. she reports she can get her wheelchair at discharge    Exercises     Assessment/Plan    PT Assessment Patient does not need any further PT services  PT Problem List         PT Treatment Interventions      PT Goals (Current goals can be found in the Care Plan section)  Acute Rehab PT Goals PT Goal Formulation: All assessment and  education complete, DC therapy    Frequency       Co-evaluation PT/OT/SLP Co-Evaluation/Treatment: Yes Reason for Co-Treatment: To address functional/ADL transfers (for discharge planning) PT goals addressed during session: Mobility/safety with mobility         AM-PAC PT "6 Clicks" Mobility  Outcome Measure Help needed turning from your back to your side while in a flat bed without using bedrails?: None Help needed moving from lying on your back to sitting on the side of a flat bed without using bedrails?: None Help needed moving to and from a bed to a chair (including a wheelchair)?: None Help needed standing up from a chair using your arms (e.g., wheelchair or bedside chair)?: None Help needed to walk in hospital room?: Total Help needed climbing 3-5 steps with a railing? : Total 6 Click Score: 18    End of Session   Activity Tolerance: Patient tolerated treatment well Patient left: in bed;with call bell/phone within reach Nurse Communication: Mobility status      Time: 0945-1001 PT Time Calculation (min) (ACUTE ONLY): 16 min   Charges:   PT Evaluation $PT Re-evaluation: 1 Re-eval  Donna Bernard, PT, MPT   Ina Homes 10/02/2022, 12:01 PM

## 2022-10-02 NOTE — Evaluation (Signed)
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 Found to have saddle pulmonary emboli s/p thrombectomy. PT asked to re-evaluate to determine if safe to discharge to homeless shelter.   Clinical Impression     Upon entering the room, pt supine in bed and reports feeling better and is agreeable to OT intervention. Pt is unable to return to current living situation. At baseline, pt transfers herself to wheelchair and [facility] as well as performing self care tasks  but needing some assistance with IADLs. Pt demonstrates mod I transfer to wheelchair. She propels self in room and over threshold into bathroom for toileting needs. Pt sets up wheelchair for transfer and uses grab bar to stand pivot onto toilet. Hygiene and clothing managed and returned to wheelchair. Pt then propels wheelchair around nurses station without issue. Pt is at current functional baseline. OT to complete orders.    Recommendations for follow up therapy are one component of a multi-disciplinary discharge planning process, led by the attending physician.  Recommendations may be updated based on patient status, additional functional criteria and insurance authorization.   Assistance Recommended at Discharge Set up Supervision/Assistance     Functional Status Assessment  Patient has not had a recent decline in their functional status  Equipment Recommendations  None recommended by OT    Recommendations for Other Services       Precautions / Restrictions Precautions Precautions: Fall Restrictions Weight Bearing Restrictions: No      Mobility Bed  Mobility Overal bed mobility: Modified Independent                  Transfers Overall transfer level: Modified independent Equipment used: None (wheelchair) Transfers: Bed to chair/wheelchair/BSC   Stand pivot transfers: Modified independent (Device/Increase time)         General transfer comment: multiple transfers performed to various surfaces including bed to and from wheelchair, wheelchair to and from toilet. patient has been routinely getting to and from the bed side commode in her room without physical assistance      Balance Overall balance assessment: Needs assistance Sitting-balance support: Feet supported, No upper extremity supported Sitting balance-Leahy Scale: Good     Standing balance support: Single extremity supported Standing balance-Leahy Scale: Fair Standing balance comment: using railing in bathroom for support with donning/doffing mesh underwear                           ADL either performed or assessed with clinical judgement   ADL Overall ADL's : At baseline                                       General ADL Comments: mod I for functional transfers. Pt did know it was a safety risk to manage mesh underwear and asked for assistance but normally wears shorts in order to manage clothing safely with toileting needs.     Vision Patient Visual Report: No change from baseline              Pertinent Vitals/Pain Pain  Assessment Pain Assessment: No/denies pain     Hand Dominance Right   Extremity/Trunk Assessment Upper Extremity Assessment Upper Extremity Assessment: LUE deficits/detail LUE Deficits / Details: residual weakness from prior stroke LUE Coordination: decreased gross motor   Lower Extremity Assessment Lower Extremity Assessment: LLE deficits/detail LLE Deficits / Details: residual weakness from prior stroke       Communication Communication Communication: No difficulties   Cognition  Arousal/Alertness: Awake/alert Behavior During Therapy: WFL for tasks assessed/performed Overall Cognitive Status: Within Functional Limits for tasks assessed                                       General Comments  patient reports she feels she is at her baseline level of functional mobility. she reports she can get her wheelchair at discharge            Home Living Family/patient expects to be discharged to:: Private residence Living Arrangements: Non-relatives/Friends   Type of Home: House Home Access: Ramped entrance                         Additional Comments: patient is no longer able to return to prior living arragements      Prior Functioning/Environment Prior Level of Function : Needs assist             Mobility Comments: independent with transfers to and from wheelchair/BSC. can propel wheelchair without assistance using RUE and RLE mostly ADLs Comments: assist required with grocery shopping, household chores. can dress and bath herself independently                 OT Goals(Current goals can be found in the care plan section) Acute Rehab OT Goals Patient Stated Goal: none stated Time For Goal Achievement: 10/02/22  OT Frequency:      Co-evaluation   Reason for Co-Treatment: To address functional/ADL transfers (for discharge planning) PT goals addressed during session: Mobility/safety with mobility        AM-PAC OT "6 Clicks" Daily Activity     Outcome Measure Help from another person eating meals?: None Help from another person taking care of personal grooming?: None Help from another person toileting, which includes using toliet, bedpan, or urinal?: None Help from another person bathing (including washing, rinsing, drying)?: None Help from another person to put on and taking off regular upper body clothing?: None Help from another person to put on and taking off regular lower body clothing?: None 6 Click Score: 24    End of Session Nurse Communication: Mobility status  Activity Tolerance: Patient tolerated treatment well Patient left: in bed;with call bell/phone within reach;with bed alarm set                   Time: 0945-1000 OT Time Calculation (min): 15 min Charges:  OT General Charges $OT Visit: 1 Visit OT Treatments $Self Care/Home Management : 8-22 mins  Jackquline Denmark, MS, OTR/L , CBIS ascom [phone]  10/02/22, 2:57 PM

## 2022-10-03 LAB — CBC
HCT: 35.3 % — ABNORMAL LOW (ref 36.0–46.0)
Hemoglobin: 9.6 g/dL — ABNORMAL LOW (ref 12.0–15.0)
MCH: 19.8 pg — ABNORMAL LOW (ref 26.0–34.0)
MCHC: 27.2 g/dL — ABNORMAL LOW (ref 30.0–36.0)
MCV: 72.9 fL — ABNORMAL LOW (ref 80.0–100.0)
Platelets: 317 10*3/uL (ref 150–400)
RBC: 4.84 MIL/uL (ref 3.87–5.11)
RDW: 24.6 % — ABNORMAL HIGH (ref 11.5–15.5)
WBC: 6.6 10*3/uL (ref 4.0–10.5)
nRBC: 0 % (ref 0.0–0.2)

## 2022-10-03 LAB — GLUCOSE, CAPILLARY
Glucose-Capillary: 102 mg/dL — ABNORMAL HIGH (ref 70–99)
Glucose-Capillary: 136 mg/dL — ABNORMAL HIGH (ref 70–99)
Glucose-Capillary: 155 mg/dL — ABNORMAL HIGH (ref 70–99)
Glucose-Capillary: 194 mg/dL — ABNORMAL HIGH (ref 70–99)

## 2022-10-03 LAB — BASIC METABOLIC PANEL
Anion gap: 6 (ref 5–15)
BUN: 10 mg/dL (ref 6–20)
CO2: 26 mmol/L (ref 22–32)
Calcium: 8.5 mg/dL — ABNORMAL LOW (ref 8.9–10.3)
Chloride: 104 mmol/L (ref 98–111)
Creatinine, Ser: 0.53 mg/dL (ref 0.44–1.00)
GFR, Estimated: >60 mL/min (ref >60)
Glucose, Bld: 114 mg/dL — ABNORMAL HIGH (ref 70–99)
Potassium: 3.5 mmol/L (ref 3.5–5.1)
Sodium: 136 mmol/L (ref 135–145)

## 2022-10-03 MED ORDER — SODIUM CHLORIDE 0.9 % IV BOLUS
500.0000 mL | Freq: Once | INTRAVENOUS | Status: AC
  Administered 2022-10-03: 500 mL via INTRAVENOUS

## 2022-10-03 MED ORDER — TUBERCULIN PPD 5 UNIT/0.1ML ID SOLN
5.0000 [IU] | Freq: Once | INTRADERMAL | Status: AC
  Administered 2022-10-03: 5 [IU] via INTRADERMAL
  Filled 2022-10-03: qty 0.1

## 2022-10-03 NOTE — [place] FL2 (Signed)
[place] MEDICAID FL2 LEVEL OF CARE FORM     IDENTIFICATION  Patient Name: Brandi Myers Birthdate: 1985/11/08 Sex: female Admission Date (Current Location): 09/18/2022  [place] and [place] Number:  [address] [id number] Facility and Address:  [facility], [address], [place], [place] [address]      Provider Number: [id number]  Attending Physician Name and Address:  Kathrynn Running, MD  Relative Name and Phone Number:  Vena Rua (Mother) [phone] [facility])    Current Level of Care: Hospital Recommended Level of Care: Other (Comment) (rest home) Prior Approval Number:    Date Approved/Denied:   PASRR Number: [id number]  Discharge Plan: Domiciliary (Rest home)    Current Diagnoses: Patient Active Problem List   Diagnosis Date Noted   Nocturnal hypoxemia 09/24/2022   Acute hypoxemic respiratory failure (HCC) 09/21/2022   Adrenal crisis (HCC) 09/20/2022   Acute blood loss anemia 09/20/2022   Obesity (BMI 30.0-34.9) 09/20/2022   Uncontrolled type 2 diabetes mellitus with hyperglycemia (HCC) 09/20/2022   History of stroke with current residual effects 09/20/2022   Leg DVT (deep venous thromboembolism), acute, bilateral (HCC) 09/20/2022   Obstructive cardiovascular shock (HCC) 09/19/2022   Acute saddle pulmonary embolism with acute cor pulmonale (HCC) 09/18/2022   Iron deficiency anemia 09/18/2022   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   Fever    Acute occlusion of artery of upper extremity due to thrombus (HCC)    Dysphagia, pharyngoesophageal phase    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, without long-term current use of insulin 07/28/2020   Benign essential hypertension 07/28/2020   Thrombocytosis    Leukocytosis    Arterial thrombosis (HCC)    Stroke (cerebrum) (HCC) 07/04/2020   Middle  cerebral artery embolism, 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   Cellulitis 02/26/2016    Orientation RESPIRATION BLADDER Height & Weight     Self, Time, Situation, Place  Normal Continent Weight: 198 lb 3.1 oz (89.9 kg) Height:  5\' 5"  (165.1 cm)  BEHAVIORAL SYMPTOMS/MOOD NEUROLOGICAL BOWEL NUTRITION STATUS      Continent Diet (carb modified)  AMBULATORY STATUS COMMUNICATION OF NEEDS Skin    (wheelchair) Verbally Bruising                       Personal Care Assistance Level of Assistance  Bathing, Feeding, Dressing Bathing Assistance: Independent Feeding assistance: Independent Dressing Assistance: Independent     Functional Limitations Info  Sight, Hearing, Speech Sight Info: Adequate Hearing Info: Adequate Speech Info: Adequate    SPECIAL CARE FACTORS FREQUENCY                       Contractures Contractures Info: Not present    Additional Factors Info  Code Status, Allergies Code Status Info: full Allergies Info: Allergies: Norco (Hydrocodone-acetaminophen)           Current Medications (10/03/2022):  This is the current hospital active medication list Current Facility-Administered Medications  Medication Dose Route Frequency Provider Last Rate Last Admin   albuterol (PROVENTIL) (2.5 MG/3ML) 0.083% nebulizer solution 3 mL  3 mL Inhalation Q6H PRN Marrion Coy, MD   3 mL at 09/22/22 0235   apixaban (ELIQUIS) tablet 5 mg  5 mg Oral BID Mila Merry A, RPH   5 mg at 10/03/22 0820   atorvastatin (LIPITOR) tablet 40 mg  40 mg Oral Daily Marrion Coy, MD  40 mg at 10/03/22 0820   cyclobenzaprine (FLEXERIL) tablet 10 mg  10 mg Oral Q8H PRN Marrion Coy, MD   10 mg at 10/02/22 2226   docusate sodium (COLACE) capsule 100 mg  100 mg Oral BID PRN Schnier, Latina Craver, MD       gabapentin (NEURONTIN) capsule 200 mg  200 mg Oral BID Erin Fulling, MD   200 mg at 10/03/22 0820   ibuprofen (ADVIL) tablet 400 mg  400 mg Oral Q6H PRN Delfino Lovett, MD   400 mg at 10/03/22 0820   insulin  aspart (novoLOG) injection 0-5 Units  0-5 Units Subcutaneous QHS Marrion Coy, MD   2 Units at 09/28/22 2154   insulin aspart (novoLOG) injection 0-9 Units  0-9 Units Subcutaneous TID WC Marrion Coy, MD   1 Units at 10/02/22 1748   insulin aspart (novoLOG) injection 4 Units  4 Units Subcutaneous TID WC Marrion Coy, MD   4 Units at 10/03/22 0821   insulin glargine-yfgn [facility]) injection 40 Units  40 Units Subcutaneous Daily Marrion Coy, MD   40 Units at 10/03/22 0820   iodixanol (VISIPAQUE) 320 MG/ML injection    PRN Renford Dills, MD   80 mL at 09/18/22 1833   melatonin tablet 2.5 mg  2.5 mg Oral QHS Tressie Ellis, RPH   2.5 mg at 10/02/22 2109   midodrine (PROAMATINE) tablet 10 mg  10 mg Oral TID WC Delfino Lovett, MD   10 mg at 10/03/22 0820   oxyCODONE-acetaminophen (PERCOCET/ROXICET) 5-325 MG per tablet 1 tablet  1 tablet Oral Q8H PRN Delfino Lovett, MD   1 tablet at 10/02/22 1739   polyethylene glycol (MIRALAX / GLYCOLAX) packet 17 g  17 g Oral Daily PRN Renford Dills, MD   17 g at 10/03/22 1914   tuberculin injection 5 Units  5 Units Intradermal Once Wouk, Wilfred Curtis, MD         Discharge Medications: Please see discharge summary for a list of discharge medications.  Relevant Imaging Results:  Relevant Lab Results:   Additional Information SS: [id number]  Jaylan Hinojosa E Talana Slatten, LCSW

## 2022-10-03 NOTE — TOC Progression Note (Addendum)
Transition of Care [facility]) - Progression Note    Patient Details  Name: Brandi Myers MRN: [id number] Date of Birth: [birth year]  Transition of [facility]) CM/SW Contact  Liliana Cline, LCSW Phone Number: 10/03/2022, 8:50 AM  Clinical Narrative:    Call to [facility] at [phone], no answer and VM full. Call to Group Home directly at [phone], staff member stated to call [name]'s mother. Call to [facility] at [phone], she states she will see [name] later today and let us know if he can take the patient. Explained we do need an answer today, she verbalized understanding.   11:37- Call to [facility] with [facility]. He states he can accept patient. Needs updated FL2 with "rest home" as recommended level of care. FL2 faxed to [phone] to [name] and he states he will get her medicines ready this weekend. Will need TB skin test results before she comes. Could take her Monday. Will need to set up wheelchair transport - address is [address] [place], [place].   12:08- Called [facility] and spoke to [place]. Arranged [facility] wheelchair Zenaida Niece pick up for Monday at 2:00 pm. Will call Unit when they arrive. Asked RN to have patient sign rider waiver and place in chart.  MD, RN, TOC Supervisor, and TOC handoff have been updated.    Expected Discharge Plan: Home/Self Care Barriers to Discharge: Continued Medical Work up  Expected Discharge Plan and Services         Expected Discharge Date: 09/23/22                                     Social Determinants of Health (SDOH) Interventions SDOH Screenings   Food Insecurity: Patient Unable To Answer (09/27/2022)  Housing: High Risk (09/27/2022)  Transportation Needs: Unknown (09/27/2022)  Utilities: Patient Unable To Answer (09/27/2022)  Tobacco Use: High Risk (09/19/2022)    Readmission Risk Interventions    09/22/2022    3:54 PM  Readmission Risk Prevention Plan   Transportation Screening Complete  PCP or Specialist Appt within 5-7 Days Complete  Home Care Screening Complete  Medication Review (RN CM) Complete

## 2022-10-03 NOTE — Progress Notes (Signed)
Pt refusing safety measures: bed alarm. Aware of possibility of fall if transferring without assistance.

## 2022-10-03 NOTE — Progress Notes (Signed)
       CROSS COVER NOTE  NAME: Brandi Myers MRN: [id number] DOB : 07-28-1985    Concern as stated by nurse / staff   Patient BP is 87/53 MAP is 65, pt has been running low and is on midodrine 10 mg 3 times daily.      Pertinent findings on chart review: Last progress note and med history reviewed: - Hypotension attributed to adrenal crisis, weaned off prednisone on 09/28/22 -Stable anemia(had acute blood loss secondary to vaginal bleeding) -On Eliquis for saddle PE s/p thrombectomy     10/03/2022    4:41 AM 10/03/2022    4:14 AM 10/02/2022   10:18 PM  Vitals with BMI  Weight  198 lbs 3 oz   BMI  32.98   Systolic 87  97  Diastolic 53  68  Pulse 72  84     Assessment and  Interventions   Assessment: Hypotension secondary to adrenal crisis, not currently on steroids  Plan: Continue midodrine Defer to primary team for consideration of restarting stress dose steroids CBC ordered Will give small fluid bolus in the interim

## 2022-10-03 NOTE — Progress Notes (Addendum)
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  CT scan showed saddle pulmonary emboli, patient was seen by vascular surgery, emergent thrombectomy was performed on 6/6.  Patient also had a significant hypotension, required Levophed in ICU. Echocardiogram showed normal ejection fraction, with increased pulmonary pressure consistent with acute cor pulmonale.  Duplex ultrasound showed bilateral lower extremity DVT. 6/8. Patient had a persistent hypotension, she was placed on hydrocortisone stress dose for adrenal crisis, blood pressure is better. Patient also developed acute hypoxemia, started bronchodilator. 6/9.  Give her lower dose IV Lasix, transferred to regular medical floor. Anticoagulation changed to Eliquis. Patient condition has improved, steroids have reduced to 2.5 mg daily 6/14, blood pressure more stable.  Patient ready for discharge, but no place to go.  Assessment and Plan:  Acute hypoxemic respiratory failure. Nocturnal hypoxia likely due to obstructive sleep apnea. Acute respiratory failure most likely secondary to acute cor pulmonale, patient hadsignificant hypoxemia on admission was placed on 10 L oxygen.   Procalcitonin level not elevated, no pneumonia.  Patient continued to require oxygen at nighttime, this could be secondary to obstructive sleep apnea.    She is stable on room air during the day  Disposition Patient has capacity. Group home has accepted her can discharge Monday, patient in agreement. Ppd placed, will need to be read on Monday. Placed right forearm,  nurse says she circled it   Back pain. Patient has been complaining of back pain, no significant tenderness in the spine.  Continue Lidoderm patch.  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 Obstructive cardiogenic shock. Bilateral lower extremity DVT. Patient is status post pulmonary thrombectomy.   Patient had been on heparin drip since admission, she was transitioned to Eliquis on 6/9.   Hypotension. Likely adrenal crisis. Patient states that she was on steroids long-term, recently discontinued.  Her blood pressure was low normally, but more significant in the hospital.  She most likely has adrenal crisis,  She was on stress dose steroids.   Midodrine added on 6/17 Now weaned off prednisone Will check an am cortisol and dheas level   Acute blood loss anemia secondary to vaginal bleeding. Iron deficient anemia. Patient had received a unit of blood transfusion at admission, also received IV iron.  Continue oral iron.  Hemoglobin is more stable. B12 level still borderline, homocystine level normal.  No additional B12 needed.   Type 2 diabetes uncontrolled with hyperglycemia. Glucose wnl, continue current regimen.   History of stroke with extremity weakness. Continue home medicines,   Obesity.  BMI 32.17. Diet and exercise advised.   Patient condition stable, no change in treatment plan.     Subjective: No new issues. Tolerating diet. No dyspnea.    Physical Exam: Vitals:   10/03/22 0736 10/03/22 0741 10/03/22 0825 10/03/22 0937  BP: (!) 78/37 (!) 80/50 (!) 90/50 (!) 90/52  Pulse: 81   80  Resp: 17   18  Temp: 98.1 F (36.7 C)   98 F (36.7 C)  TempSrc:      SpO2: 92%   94%  Weight:      Height:       General exam: Appears calm and comfortable  Respiratory system: Clear to auscultation. Respiratory effort normal. Cardiovascular system: S1 & S2 heard, RRR. No JVD,  murmurs, rubs, gallops or clicks. No pedal edema. Gastrointestinal system: Abdomen is nondistended, soft and nontender. No organomegaly or masses felt. Normal bowel sounds heard. Central nervous system: Alert and oriented x4. left-sided weakness Extremities: weakness left side Skin: No rashes, lesions or ulcers Psychiatry:  Affect flat Data Reviewed:  There are no new results to review at this time.  Family Communication: None at bedside  Disposition: Status is: Inpatient Remains inpatient appropriate because: Medically stable, waiting for placement  Planned Discharge Destination: residential care on monday   DVT prophylaxis-Eliquis Time spent: 25 minutes  Author: Silvano Bilis, MD 10/03/2022 1:50 PM  For on call review [email or web address].

## 2022-10-04 LAB — GLUCOSE, CAPILLARY
Glucose-Capillary: 131 mg/dL — ABNORMAL HIGH (ref 70–99)
Glucose-Capillary: 184 mg/dL — ABNORMAL HIGH (ref 70–99)
Glucose-Capillary: 211 mg/dL — ABNORMAL HIGH (ref 70–99)
Glucose-Capillary: 229 mg/dL — ABNORMAL HIGH (ref 70–99)

## 2022-10-04 LAB — CORTISOL-AM, BLOOD: Cortisol - AM: 3.3 ug/dL — ABNORMAL LOW (ref 6.7–22.6)

## 2022-10-04 MED ORDER — INSULIN GLARGINE-YFGN 100 UNIT/ML ~~LOC~~ SOLN: 40.0000 [IU] | Freq: Every day | SUBCUTANEOUS | 11 refills | Status: AC

## 2022-10-04 MED ORDER — APIXABAN 5 MG PO TABS: 5.0000 mg | ORAL_TABLET | Freq: Two times a day (BID) | ORAL | 0 refills | Status: AC

## 2022-10-04 MED ORDER — MELATONIN 3 MG PO TABS: 3.0000 mg | ORAL_TABLET | Freq: Every evening | ORAL | Status: AC | PRN

## 2022-10-04 MED ORDER — ADULT MULTIVITAMIN W/MINERALS CH: 1.0000 | ORAL_TABLET | Freq: Every day | ORAL | Status: AC

## 2022-10-04 MED ORDER — ATORVASTATIN CALCIUM 40 MG PO TABS: 40.0000 mg | ORAL_TABLET | Freq: Every day | ORAL | Status: AC

## 2022-10-04 MED ORDER — INSULIN ASPART 100 UNIT/ML IJ SOLN: 4.0000 [IU] | Freq: Three times a day (TID) | INTRAMUSCULAR | 11 refills | Status: AC

## 2022-10-04 MED ORDER — MIDODRINE HCL 10 MG PO TABS: 10.0000 mg | ORAL_TABLET | Freq: Three times a day (TID) | ORAL | 1 refills | Status: DC

## 2022-10-04 NOTE — TOC Progression Note (Signed)
Transition of Care [facility]) - Progression Note    Patient Details  Name: Brandi Myers MRN: [id number] Date of Birth: March 01, 1986  Transition of Care [facility]) CM/SW Contact  Garret Reddish, RN Phone Number: 10/04/2022, 4:56 PM  Clinical Narrative:  Discharge Summary and Signed Fl2 faxed to [facility].  I have attempted to call Mr. Wyline Mood and let him know that [facility] and Discharge Summary was sent via fax number that he provided. I have left a message for a return call to ensure he has received requested information.      TOC will continue to follow for discharge planning.      Expected Discharge Plan: Home/Self Care Barriers to Discharge: Continued Medical Work up  Expected Discharge Plan and Services         Expected Discharge Date: 09/23/22                                     Social Determinants of Health (SDOH) Interventions SDOH Screenings   Food Insecurity: Patient Unable To Answer (09/27/2022)  Housing: High Risk (09/27/2022)  Transportation Needs: Unknown (09/27/2022)  Utilities: Patient Unable To Answer (09/27/2022)  Tobacco Use: High Risk (09/19/2022)    Readmission Risk Interventions    09/22/2022    3:54 PM  Readmission Risk Prevention Plan  Transportation Screening Complete  PCP or Specialist Appt within 5-7 Days Complete  Home Care Screening Complete  Medication Review (RN CM) Complete

## 2022-10-04 NOTE — Progress Notes (Signed)
Patient refuses bed alarms, patient alert and oriented, educated about safety, will continue to monitor.

## 2022-10-04 NOTE — Discharge Summary (Addendum)
Brandi Myers:[id number] DOB: 08/29/1985 DOA: 09/18/2022  PCP: [name]  Admit date: 09/18/2022 Discharge date: 10/06/2022  Time spent: 35 minutes  Recommendations for Outpatient Follow-up:  Pcp f/u   Outpatient sleep apnea evaluation    Discharge Diagnoses:  Principal Problem:   Acute saddle pulmonary embolism with acute cor pulmonale (HCC) Active Problems:   Benign essential hypertension   Adjustment disorder with mixed disturbance of emotions and conduct   Iron deficiency anemia   Obstructive cardiovascular shock (HCC)   Adrenal crisis (HCC)   Acute blood loss anemia   Obesity (BMI 30.0-34.9)   Uncontrolled type 2 diabetes mellitus with hyperglycemia (HCC)   History of stroke with current residual effects   Leg DVT (deep venous thromboembolism), acute, bilateral (HCC)   Acute hypoxemic respiratory failure (HCC)   Nocturnal hypoxemia   Discharge Condition: stable  Diet recommendation: heart healthy  Filed Weights   10/03/22 0414 10/05/22 0210 10/06/22 0500  Weight: 89.9 kg 87.3 kg 88.6 kg    History of present illness:  From admission h and p Brandi Myers is a 37 year old female with a past medical history significant for 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   She reports she was in her usual state of health until last night around midnight when she developed shortness of breath following smoking cigarettes.  She became more anxious and more short of breath therefore she smoked marijuana.  She still felt anxious following marijuana use, therefore she presented to the ED for further evaluation.   She reports that her mobility has been significantly affected since her stroke in 2022, she has limited movement of the left side and is wheelchair-bound.  She also reports  occasional cocaine use.  Hospital Course:   Acute hypoxemic respiratory failure. Nocturnal hypoxia likely due to obstructive sleep apnea. Acute respiratory failure most likely secondary to acute cor pulmonale, patient hadsignificant hypoxemia on admission was placed on 10 L oxygen.   Patient continued to require oxygen at nighttime, this could be secondary to obstructive sleep apnea.    She is stable on room air during the day, consider outpt polysomnography   Saddle pulmonary emboli with acute cor pulmonale. Obstructive cardiogenic shock. Bilateral lower extremity DVT. Patient is status post pulmonary thrombectomy.   Patient had been on heparin drip since admission, she was transitioned to Eliquis on 6/9.   Hypotension. Possible adrenal insufficiency Reports chronic hypotension, asymptomatic. Treated with stress dose steroids here. Denies prior steroids. Acth stim test negative, doubt chronic adrenal insufficiency  Iron deficiency anemia Patient had received a unit of blood transfusion at admission, also received IV iron.  Continue oral iron.  Hemoglobin is improving   Type 2 diabetes uncontrolled with hyperglycemia. Glucose wnl, continue current regimen. Check glucose before each meal and at bedtime. F/u with pcp within a few weeks   History of stroke with extremity weakness. Stable, aspirin stopped with addition of apixaban   Obesity.  BMI 32.17.  Procedures: none   Consultations: pccm  Discharge Exam: Vitals:   10/06/22 0916 10/06/22 1200  BP: 107/76 98/64  Pulse: 93   Resp:    Temp:    SpO2:      General exam: Appears calm and comfortable  Respiratory system: Clear to auscultation. Respiratory effort normal. Cardiovascular system: S1 & S2 heard, RRR. No JVD,  murmurs, rubs, gallops or clicks. No pedal edema. Gastrointestinal system: Abdomen is nondistended, soft and nontender. No organomegaly or masses felt. Normal bowel sounds heard. Central nervous system:  Alert and oriented x4. left-sided weakness Extremities: weakness left side Skin: No rashes, lesions or ulcers Psychiatry:  Affect flat  Discharge Instructions   Discharge Instructions     Diet - low sodium heart healthy   Complete by: As directed    Diet Carb Modified   Complete by: As directed    Increase activity slowly   Complete by: As directed    Increase activity slowly   Complete by: As directed       Allergies as of 10/06/2022       Reactions   Norco [hydrocodone-acetaminophen] Itching, Nausea And Vomiting, Swelling        Medication List     STOP taking these medications    amoxicillin 500 MG capsule Commonly known as: AMOXIL   aspirin EC 81 MG tablet   insulin aspart 100 UNIT/ML injection Commonly known as: novoLOG Replaced by: insulin aspart 100 UNIT/ML injection   insulin detemir 100 UNIT/ML injection Commonly known as: LEVEMIR   metoprolol tartrate 50 MG tablet Commonly known as: LOPRESSOR   risperiDONE 0.5 MG tablet Commonly known as: RISPERDAL   sertraline 25 MG tablet Commonly known as: ZOLOFT   traMADol 50 MG tablet Commonly known as: Ultram       TAKE these medications    acetaminophen 500 MG tablet Commonly known as: TYLENOL Take 500 mg by mouth every 6 (six) hours as needed for headache, fever, moderate pain or mild pain. What changed: Another medication with the same name was removed. Continue taking this medication, and follow the directions you see here.   albuterol 108 (90 Base) MCG/ACT inhaler Commonly known as: VENTOLIN HFA Inhale 1-2 puffs into the lungs every 6 (six) hours as needed for wheezing or shortness of breath.   apixaban 5 MG Tabs tablet Commonly known as: ELIQUIS Take 1 tablet (5 mg total) by mouth 2 (two) times daily.   atorvastatin 40 MG tablet Commonly known as: LIPITOR Take 1 tablet (40 mg total) by mouth daily.   cyclobenzaprine 10 MG tablet Commonly known as: FLEXERIL Take 10 mg by mouth every  8 (eight) hours as needed for muscle spasms.   ferrous sulfate 325 (65 FE) MG EC tablet Take 1 tablet (325 mg total) by mouth daily with breakfast.   gabapentin 300 MG capsule Commonly known as: NEURONTIN Take 300 mg by mouth 2 (two) times daily.   insulin aspart 100 UNIT/ML injection Commonly known as: novoLOG Inject 4 Units into the skin 3 (three) times daily with meals. Replaces: insulin aspart 100 UNIT/ML injection   insulin glargine-yfgn 100 UNIT/ML injection Commonly known as: SEMGLEE Inject 0.4 mLs (40 Units total) into the skin daily.   loratadine 10 MG tablet Commonly known as: CLARITIN Take 1 tablet (10 mg total) by mouth daily. Start taking on: October 07, 2022   melatonin 3 MG Tabs tablet Take 1 tablet (3 mg total) by mouth at bedtime as needed.   multivitamin with minerals Tabs tablet Take 1 tablet by mouth daily.       Allergies  Allergen Reactions   Norco [Hydrocodone-Acetaminophen] Itching, Nausea And Vomiting and Swelling    Follow-up Information     [facility]. Call.   Specialty: General Practice Why: Office will call patient with follow up appt Contact information: [address]  [place] [address] [phone]         Annice Needy, MD. Go on 10/23/2022.   Specialties: Vascular Surgery, Radiology, Interventional Cardiology Why: @ 11:30am Contact information: [address] [place] [address] [phone]                  The results of significant diagnostics from this hospitalization (including imaging, microbiology, ancillary and laboratory) are listed below for reference.    Significant Diagnostic Studies: DG Chest Port 1 View  Result Date: 10/02/2022 CLINICAL DATA:  Cough. EXAM: PORTABLE CHEST 1 VIEW COMPARISON:  09/21/2022. FINDINGS: Interval improvement in the previously seen opacity of the left costophrenic sulcus. No consolidation or pulmonary edema. Stable  cardiac and mediastinal contours. No pleural effusion pneumothorax. Visualized bones and upper abdomen are unremarkable. IMPRESSION: Interval improvement in the previously seen opacity of the left costophrenic sulcus. No acute findings. Electronically Signed   By: Orvan Falconer M.D.   On: 10/02/2022 12:02   DG Chest Port 1 View  Result Date: 09/21/2022 CLINICAL DATA:  Pulmonary emboli EXAM: PORTABLE CHEST 1 VIEW COMPARISON:  09/18/2022 CT and chest radiograph FINDINGS: Telemetry leads overlie the chest. The cardiomediastinal silhouette is unremarkable. Focal opacity at the LATERAL LEFT lung base likely represents atelectasis or infarct. The lungs are otherwise clear. There is no evidence of pneumothorax or large pleural effusion. No acute bony abnormalities are noted. IMPRESSION: Focal opacity at the LATERAL LEFT lung base likely represents atelectasis vs infarct. Infection is considered less likely. Electronically Signed   By: Harmon Pier M.D.   On: 09/21/2022 13:38   PERIPHERAL VASCULAR CATHETERIZATION  Result Date: 09/18/2022 See surgical note for result.  ECHOCARDIOGRAM COMPLETE  Result Date: 09/18/2022    ECHOCARDIOGRAM REPORT   Patient Name:   Brandi Myers Illescas Date of Exam: 09/18/2022 Medical Rec #:  [id number]       Height:       65.0 in Accession #:    [id number]      Weight:       230.0 lb Date of Birth:  [birth year]      BSA:          2.099 m Patient Age:    36 years        BP:           112/62 mmHg Patient Gender: F               HR:           93 bpm. Exam Location:  [facility] Procedure: 2D Echo, Cardiac Doppler and Color Doppler Indications:     Pulmonary Embolus I26.09  History:         Patient has prior history of Echocardiogram examinations, most                  recent 07/19/2020. Risk Factors:Diabetes and Hypertension.  Sonographer:     Cristela Blue Referring Phys:  [id number] Judithe Modest Diagnosing Phys: Julien Nordmann MD  Sonographer Comments: Suboptimal apical window. IMPRESSIONS  1. Left  ventricular ejection fraction, by estimation, is 60 to 65%. The left ventricle has normal function. The left ventricle has no regional wall motion abnormalities. Left ventricular diastolic parameters were normal. There is the interventricular septum is flattened in systole and diastole, consistent with right ventricular pressure and volume overload.  2. Right ventricular systolic function is low normal. The right ventricular size is moderately enlarged. There is mildly elevated pulmonary artery systolic pressure. The  estimated right ventricular systolic pressure is 44.2 mmHg.  3. The mitral valve is normal in structure. No evidence of mitral valve regurgitation. No evidence of mitral stenosis.  4. The aortic valve has an indeterminant number of cusps. Aortic valve regurgitation is not visualized. No aortic stenosis is present.  5. The inferior vena cava is normal in size with greater than 50% respiratory variability, suggesting right atrial pressure of 3 mmHg. FINDINGS  Left Ventricle: Left ventricular ejection fraction, by estimation, is 60 to 65%. The left ventricle has normal function. The left ventricle has no regional wall motion abnormalities. The left ventricular internal cavity size was normal in size. There is  no left ventricular hypertrophy. The interventricular septum is flattened in systole and diastole, consistent with right ventricular pressure and volume overload. Left ventricular diastolic parameters were normal. Right Ventricle: The right ventricular size is moderately enlarged. No increase in right ventricular wall thickness. Right ventricular systolic function is low normal. There is mildly elevated pulmonary artery systolic pressure. The tricuspid regurgitant  velocity is 3.13 m/s, and with an assumed right atrial pressure of 5 mmHg, the estimated right ventricular systolic pressure is 44.2 mmHg. Left Atrium: Left atrial size was normal in size. Right Atrium: Right atrial size was normal in  size. Pericardium: There is no evidence of pericardial effusion. Mitral Valve: The mitral valve is normal in structure. No evidence of mitral valve regurgitation. No evidence of mitral valve stenosis. Tricuspid Valve: The tricuspid valve is normal in structure. Tricuspid valve regurgitation is mild . No evidence of tricuspid stenosis. Aortic Valve: The aortic valve has an indeterminant number of cusps. Aortic valve regurgitation is not visualized. No aortic stenosis is present. Aortic valve mean gradient measures 4.0 mmHg. Aortic valve peak gradient measures 7.5 mmHg. Aortic valve area, by VTI measures 3.10 cm. Pulmonic Valve: The pulmonic valve was normal in structure. Pulmonic valve regurgitation is not visualized. No evidence of pulmonic stenosis. Aorta: The aortic root is normal in size and structure. Venous: The inferior vena cava is normal in size with greater than 50% respiratory variability, suggesting right atrial pressure of 3 mmHg. IAS/Shunts: No atrial level shunt detected by color flow Doppler.  LEFT VENTRICLE PLAX 2D LVIDd:         3.30 cm   Diastology LVIDs:         1.80 cm   LV e' medial:    13.80 cm/s LV PW:         1.10 cm   LV E/e' medial:  8.1 LV IVS:        1.00 cm   LV e' lateral:   14.80 cm/s LVOT diam:     2.10 cm   LV E/e' lateral: 7.6 LV SV:         56 LV SV Index:   27 LVOT Area:     3.46 cm  RIGHT VENTRICLE RV Basal diam:  3.40 cm RV Mid diam:    3.20 cm RV S prime:     12.70 cm/s TAPSE (M-mode): 2.5 cm LEFT ATRIUM             Index        RIGHT ATRIUM           Index LA diam:        2.30 cm 1.10 cm/m   RA Area:     13.20 cm LA Vol (A2C):   29.9 ml 14.24 ml/m  RA Volume:   32.00 ml  15.24 ml/m LA  Vol (A4C):   36.4 ml 17.34 ml/m LA Biplane Vol: 33.4 ml 15.91 ml/m  AORTIC VALVE AV Area (Vmax):    2.81 cm AV Area (Vmean):   2.68 cm AV Area (VTI):     3.10 cm AV Vmax:           137.00 cm/s AV Vmean:          88.800 cm/s AV VTI:            0.180 m AV Peak Grad:      7.5 mmHg AV Mean  Grad:      4.0 mmHg LVOT Vmax:         111.00 cm/s LVOT Vmean:        68.700 cm/s LVOT VTI:          0.161 m LVOT/AV VTI ratio: 0.89  AORTA Ao Root diam: 2.60 cm MITRAL VALVE                TRICUSPID VALVE MV Area (PHT): 3.20 cm     TR Peak grad:   39.2 mmHg MV Decel Time: 237 msec     TR Vmax:        313.00 cm/s MV E velocity: 112.00 cm/s MV A velocity: 96.90 cm/s   SHUNTS MV E/A ratio:  1.16         Systemic VTI:  0.16 m                             Systemic Diam: 2.10 cm Julien Nordmann MD Electronically signed by Julien Nordmann MD Signature Date/Time: 09/18/2022/4:02:06 PM    Final    US Venous Img Lower Bilateral  Result Date: 09/18/2022 CLINICAL DATA:  Left lower extremity swelling. Positive pulmonary embolism. EXAM: BILATERAL LOWER EXTREMITY VENOUS DOPPLER ULTRASOUND TECHNIQUE: Gray-scale sonography with graded compression, as well as color Doppler and duplex ultrasound were performed to evaluate the lower extremity deep venous systems from the level of the common femoral vein and including the common femoral, femoral, profunda femoral, popliteal and calf veins including the posterior tibial, peroneal and gastrocnemius veins when visible. The superficial great saphenous vein was also interrogated. Spectral Doppler was utilized to evaluate flow at rest and with distal augmentation maneuvers in the common femoral, femoral and popliteal veins. COMPARISON:  None Available. FINDINGS: RIGHT LOWER EXTREMITY Common Femoral Vein: No evidence of thrombus. Normal compressibility, respiratory phasicity and response to augmentation. Saphenofemoral Junction: No evidence of thrombus. Normal compressibility and flow on color Doppler imaging. Profunda Femoral Vein: No evidence of thrombus. Normal compressibility and flow on color Doppler imaging. Femoral Vein: No evidence of thrombus. Normal compressibility, respiratory phasicity and response to augmentation. Popliteal Vein: Positive for occlusive thrombus. Calf Veins:  Positive for occlusive thrombus involving the posterior tibial vein and peroneal vein. Superficial Great Saphenous Vein: No evidence of thrombus. Normal compressibility. Venous Reflux:  None. Other Findings:  None. LEFT LOWER EXTREMITY Common Femoral Vein: No evidence of thrombus. Normal compressibility, respiratory phasicity and response to augmentation. Saphenofemoral Junction: No evidence of thrombus. Normal compressibility and flow on color Doppler imaging. Profunda Femoral Vein: No evidence of thrombus. Normal compressibility and flow on color Doppler imaging. Femoral Vein: No evidence of thrombus. Normal compressibility, respiratory phasicity and response to augmentation. Popliteal Vein: Positive for occlusive thrombus. Calf Veins: Positive for occlusive thrombus involving the posterior tibial vein and peroneal vein. Superficial Great Saphenous Vein: No evidence of thrombus. Normal compressibility. Venous Reflux:  None.  Other Findings:  None. IMPRESSION: 1. Examination is positive for bilateral lower extremity DVT in the popliteal veins, posterior tibial veins and peroneal veins. Electronically Signed   By: Signa Kell M.D.   On: 09/18/2022 12:29   CT Angio Chest PE W and/or Wo Contrast  Result Date: 09/18/2022 CLINICAL DATA:  Pulmonary embolism (PE) suspected, low to intermediate prob, positive D-dimer EXAM: CT ANGIOGRAPHY CHEST WITH CONTRAST TECHNIQUE: Multidetector CT imaging of the chest was performed using the standard protocol during bolus administration of intravenous contrast. Multiplanar CT image reconstructions and MIPs were obtained to evaluate the vascular anatomy. RADIATION DOSE REDUCTION: This exam was performed according to the departmental dose-optimization program which includes automated exposure control, adjustment of the mA and/or kV according to patient size and/or use of iterative reconstruction technique. CONTRAST:  75mL OMNIPAQUE IOHEXOL 350 MG/ML SOLN COMPARISON:  Chest XR,  concurrent.  CTA chest, 07/28/2020. FINDINGS: Cardiovascular: Satisfactory opacification of the pulmonary arteries to the segmental level. Saddle embolus with extension to the upper and lower segmental pulmonary arteries, bilaterally. Leftward interventricular septum bowing with RV/LV ratio 1.75. Trace pericardial effusion. Mediastinum/Nodes: No enlarged mediastinal, hilar, or axillary lymph nodes. Thyroid gland, trachea, and esophagus demonstrate no significant findings. Lungs/Pleura: Lungs are clear without focal consolidation, mass or suspicious pulmonary nodule. No pleural effusion or pneumothorax. Upper Abdomen: No acute abnormality.  Cholecystectomy clips. Musculoskeletal: No acute chest wall abnormality. No acute or significant osseous findings. Review of the MIP images confirms the above findings. IMPRESSION: Examination is POSITIVE for saddle pulmonary embolus, with additional BILATERAL segmental pulmonary emboli. CT evidence of right heart strain (RV/LV Ratio = 1.75) consistent with at least submassive (intermediate risk) PE. The presence of right heart strain has been associated with an increased risk of morbidity and mortality. These results will be called to the ordering clinician or representative by the Radiologist Assistant, and communication documented in the PACS or [employer]. Electronically Signed   By: Roanna Banning M.D.   On: 09/18/2022 09:39   DG Chest 1 View  Result Date: 09/18/2022 CLINICAL DATA:  Generalized weakness and cough with shortness of breath EXAM: CHEST  1 VIEW COMPARISON:  08/04/2020 FINDINGS: Stable cardiomediastinal silhouette. Perihilar and peribronchial thickening. No focal consolidation, pleural effusion, or pneumothorax. No displaced rib fractures. IMPRESSION: Bronchitis/reactive airways. Electronically Signed   By: Minerva Fester M.D.   On: 09/18/2022 01:22    Microbiology: Recent Results (from the past 240 hour(s))  SARS Coronavirus 2 by RT PCR (hospital  order, performed in [facility] hospital lab) *cepheid single result test* Anterior Nasal Swab     Status: None   Collection Time: 10/02/22  8:51 AM   Specimen: Anterior Nasal Swab  Result Value Ref Range Status   SARS Coronavirus 2 by RT PCR NEGATIVE NEGATIVE Final    Comment: (NOTE) SARS-CoV-2 target nucleic acids are NOT DETECTED.  The SARS-CoV-2 RNA is generally detectable in upper and lower respiratory specimens during the acute phase of infection. The lowest concentration of SARS-CoV-2 viral copies this assay can detect is 250 copies / mL. A negative result does not preclude SARS-CoV-2 infection and should not be used as the sole basis for treatment or other patient management decisions.  A negative result may occur with improper specimen collection / handling, submission of specimen other than nasopharyngeal swab, presence of viral mutation(s) within the areas targeted by this assay, and inadequate number of viral copies (<250 copies / mL). A negative result must be combined with clinical observations, patient history,  and epidemiological information.  Fact Sheet for Patients:   RoadLapTop.co.za  Fact Sheet for Healthcare Providers: http://kim-miller.com/  This test is not yet approved or  cleared by the [place] FDA and has been authorized for detection and/or diagnosis of SARS-CoV-2 by FDA under an Emergency Use Authorization (EUA).  This EUA will remain in effect (meaning this test can be used) for the duration of the COVID-19 declaration under Section 564(b)(1) of the Act, 21 U.S.C. section 360bbb-3(b)(1), unless the authorization is terminated or revoked sooner.  Performed at [facility], [address] [place], [place] [address]      Labs: Basic Metabolic Panel: Recent Labs  Lab 10/01/22 0434 10/03/22 0707  NA 136 136  K 3.8 3.5  CL 106 104  CO2 23 26  GLUCOSE 145* 114*  BUN 12 10  CREATININE  0.53 0.53  CALCIUM 8.4* 8.5*   Liver Function Tests: No results for input(s): "AST", "ALT", "ALKPHOS", "BILITOT", "PROT", "ALBUMIN" in the last 168 hours. No results for input(s): "LIPASE", "AMYLASE" in the last 168 hours. No results for input(s): "AMMONIA" in the last 168 hours. CBC: Recent Labs  Lab 10/01/22 0434 10/03/22 0707  WBC 8.5 6.6  HGB 9.3* 9.6*  HCT 34.3* 35.3*  MCV 73.4* 72.9*  PLT 322 317   Cardiac Enzymes: No results for input(s): "CKTOTAL", "CKMB", "CKMBINDEX", "TROPONINI" in the last 168 hours. BNP: BNP (last 3 results) Recent Labs    09/21/22 0501  BNP 353.3*    ProBNP (last 3 results) No results for input(s): "PROBNP" in the last 8760 hours.  CBG: Recent Labs  Lab 10/05/22 1139 10/05/22 1701 10/05/22 2057 10/06/22 0724 10/06/22 1226  GLUCAP 225* 167* 225* 165* 236*       Signed:  Silvano Bilis MD.  [facility] 10/06/2022, 12:58 PM

## 2022-10-04 NOTE — Plan of Care (Signed)
  Problem: Education: Goal: Knowledge of General Education information will improve Description: Including pain rating scale, medication(s)/side effects and non-pharmacologic comfort measures Outcome: Progressing   Problem: Health Behavior/Discharge Planning: Goal: Ability to manage health-related needs will improve Outcome: Progressing   Problem: Clinical Measurements: Goal: Ability to maintain clinical measurements within normal limits will improve Outcome: Progressing Goal: Will remain free from infection Outcome: Progressing Goal: Diagnostic test results will improve Outcome: Progressing Goal: Respiratory complications will improve Outcome: Progressing Goal: Cardiovascular complication will be avoided Outcome: Progressing   Problem: Activity: Goal: Risk for activity intolerance will decrease Outcome: Progressing   Problem: Education: Goal: Ability to describe self-care measures that may prevent or decrease complications (Diabetes Survival Skills Education) will improve Outcome: Progressing Goal: Individualized Educational Video(s) Outcome: Progressing   Problem: Coping: Goal: Ability to adjust to condition or change in health will improve Outcome: Progressing   Problem: Fluid Volume: Goal: Ability to maintain a balanced intake and output will improve Outcome: Progressing   Problem: Health Behavior/Discharge Planning: Goal: Ability to identify and utilize available resources and services will improve Outcome: Progressing Goal: Ability to manage health-related needs will improve Outcome: Progressing   Problem: Metabolic: Goal: Ability to maintain appropriate glucose levels will improve Outcome: Progressing   Problem: Nutritional: Goal: Maintenance of adequate nutrition will improve Outcome: Progressing Goal: Progress toward achieving an optimal weight will improve Outcome: Progressing   Problem: Skin Integrity: Goal: Risk for impaired skin integrity will  decrease Outcome: Progressing   Problem: Tissue Perfusion: Goal: Adequacy of tissue perfusion will improve Outcome: Progressing   

## 2022-10-04 NOTE — TOC Progression Note (Signed)
Transition of Care [facility]) - Progression Note    Patient Details  Name: LASHAWNA POCHE MRN: [id number] Date of Birth: 12-Sep-1985  Transition of Care [facility]) CM/SW Contact  Garret Reddish, RN Phone Number: 10/04/2022, 1:10 PM  Clinical Narrative:   I have received call from Malon Kindle, ower of [facility].  He informs me that he will need patient's discharge medications this weekend so that he can have medications ready for patient on Monday.  He also informs me that he will need a prescription for the narcotic medications.    I have informed Dr. Ashok Pall of the above request.  Dr. Ashok Pall will completed discharge summary with medications this weekend.    Discharge medications will need to be upload to [facility].  Fl2 will need to be faxed to Mrs. Rucker at [phone].    TOC will continue to follow for discharge planning.      Expected Discharge Plan: Home/Self Care Barriers to Discharge: Continued Medical Work up  Expected Discharge Plan and Services         Expected Discharge Date: 09/23/22                                     Social Determinants of Health (SDOH) Interventions SDOH Screenings   Food Insecurity: Patient Unable To Answer (09/27/2022)  Housing: High Risk (09/27/2022)  Transportation Needs: Unknown (09/27/2022)  Utilities: Patient Unable To Answer (09/27/2022)  Tobacco Use: High Risk (09/19/2022)    Readmission Risk Interventions    09/22/2022    3:54 PM  Readmission Risk Prevention Plan  Transportation Screening Complete  PCP or Specialist Appt within 5-7 Days Complete  Home Care Screening Complete  Medication Review (RN CM) Complete

## 2022-10-04 NOTE — NC FL2 (Signed)
[place] MEDICAID FL2 LEVEL OF CARE FORM     IDENTIFICATION  Patient Name: Brandi Myers Birthdate: [birth year] Sex: female Admission Date (Current Location): 09/18/2022  [place] and [place] Number:  [address] [id number] Facility and Address:  [facility], [address], [place], [place] [address]      Provider Number: [id number]  Attending Physician Name and Address:  Kathrynn Running, MD  Relative Name and Phone Number:  Vena Rua (Mother) [phone] [facility])    Current Level of Care: Hospital Recommended Level of Care: Other (Comment) (rest home) Prior Approval Number:    Date Approved/Denied:   PASRR Number: [id number]  Discharge Plan: Domiciliary (Rest home)    Current Diagnoses: Patient Active Problem List   Diagnosis Date Noted   Nocturnal hypoxemia 09/24/2022   Acute hypoxemic respiratory failure (HCC) 09/21/2022   Adrenal crisis (HCC) 09/20/2022   Acute blood loss anemia 09/20/2022   Obesity (BMI 30.0-34.9) 09/20/2022   Uncontrolled type 2 diabetes mellitus with hyperglycemia (HCC) 09/20/2022   History of stroke with current residual effects 09/20/2022   Leg DVT (deep venous thromboembolism), acute, bilateral (HCC) 09/20/2022   Obstructive cardiovascular shock (HCC) 09/19/2022   Acute saddle pulmonary embolism with acute cor pulmonale (HCC) 09/18/2022   Iron deficiency anemia 09/18/2022   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   Fever    Acute occlusion of artery of upper extremity due to thrombus (HCC)    Dysphagia, pharyngoesophageal phase    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, without long-term current use of insulin 07/28/2020   Benign essential hypertension 07/28/2020   Thrombocytosis    Leukocytosis    Arterial thrombosis (HCC)    Stroke (cerebrum) (HCC) 07/04/2020   Middle  cerebral artery embolism, 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   Cellulitis 02/26/2016    Orientation RESPIRATION BLADDER Height & Weight     Self, Time, Situation, Place  Normal Continent Weight: 89.9 kg Height:  5\' 5"  (165.1 cm)  BEHAVIORAL SYMPTOMS/MOOD NEUROLOGICAL BOWEL NUTRITION STATUS      Continent Diet (carb modified)  AMBULATORY STATUS COMMUNICATION OF NEEDS Skin    (wheelchair) Verbally Bruising                       Personal Care Assistance Level of Assistance  Bathing, Feeding, Dressing Bathing Assistance: Independent Feeding assistance: Independent Dressing Assistance: Independent     Functional Limitations Info  Sight, Hearing, Speech Sight Info: Adequate Hearing Info: Adequate Speech Info: Adequate    SPECIAL CARE FACTORS FREQUENCY                       Contractures Contractures Info: Not present    Additional Factors Info  Code Status, Allergies Code Status Info: full Allergies Info: Allergies: Norco (Hydrocodone-acetaminophen)           Current Medications (10/04/2022):  This is the current hospital active medication list Current Facility-Administered Medications  Medication Dose Route Frequency Provider Last Rate Last Admin   albuterol (PROVENTIL) (2.5 MG/3ML) 0.083% nebulizer solution 3 mL  3 mL Inhalation Q6H PRN Marrion Coy, MD   3 mL at 09/22/22 0235   apixaban (ELIQUIS) tablet 5 mg  5 mg Oral BID Mila Merry A, RPH   5 mg at 10/04/22 0930   atorvastatin (LIPITOR) tablet 40 mg  40 mg Oral Daily Marrion Coy, MD   40 mg at  10/04/22 0930   cyclobenzaprine (FLEXERIL) tablet 10 mg  10 mg Oral Q8H PRN Marrion Coy, MD   10 mg at 10/04/22 1239   docusate sodium (COLACE) capsule 100 mg  100 mg Oral BID PRN Schnier, Latina Craver, MD       gabapentin (NEURONTIN) capsule 200 mg  200 mg Oral BID Erin Fulling, MD   200 mg at 10/04/22 0930   ibuprofen (ADVIL) tablet 400 mg  400 mg Oral Q6H PRN Delfino Lovett, MD   400 mg at 10/03/22 0820   insulin aspart (novoLOG)  injection 0-5 Units  0-5 Units Subcutaneous QHS Marrion Coy, MD   2 Units at 09/28/22 2154   insulin aspart (novoLOG) injection 0-9 Units  0-9 Units Subcutaneous TID WC Marrion Coy, MD   2 Units at 10/04/22 1237   insulin aspart (novoLOG) injection 4 Units  4 Units Subcutaneous TID WC Marrion Coy, MD   4 Units at 10/04/22 1237   insulin glargine-yfgn (SEMGLEE) injection 40 Units  40 Units Subcutaneous Daily Marrion Coy, MD   40 Units at 10/04/22 0931   iodixanol (VISIPAQUE) 320 MG/ML injection    PRN Renford Dills, MD   80 mL at 09/18/22 1833   melatonin tablet 2.5 mg  2.5 mg Oral QHS Tressie Ellis, RPH   2.5 mg at 10/03/22 2229   midodrine (PROAMATINE) tablet 10 mg  10 mg Oral TID WC Delfino Lovett, MD   10 mg at 10/04/22 1237   oxyCODONE-acetaminophen (PERCOCET/ROXICET) 5-325 MG per tablet 1 tablet  1 tablet Oral Q8H PRN Delfino Lovett, MD   1 tablet at 10/04/22 0930   polyethylene glycol (MIRALAX / GLYCOLAX) packet 17 g  17 g Oral Daily PRN Renford Dills, MD   17 g at 10/03/22 8295   tuberculin injection 5 Units  5 Units Intradermal Once Kathrynn Running, MD   5 Units at 10/03/22 1206     Discharge Medications: TAKE these medications     acetaminophen 500 MG tablet Commonly known as: TYLENOL Take 500 mg by mouth every 6 (six) hours as needed for headache, fever, moderate pain or mild pain. What changed: Another medication with the same name was removed. Continue taking this medication, and follow the directions you see here.    albuterol 108 (90 Base) MCG/ACT inhaler Commonly known as: VENTOLIN HFA Inhale 1-2 puffs into the lungs every 6 (six) hours as needed for wheezing or shortness of breath.    apixaban 5 MG Tabs tablet Commonly known as: ELIQUIS Take 1 tablet (5 mg total) by mouth 2 (two) times daily.    atorvastatin 40 MG tablet Commonly known as: LIPITOR Take 1 tablet (40 mg total) by mouth daily.    cyclobenzaprine 10 MG tablet Commonly known as:  FLEXERIL Take 10 mg by mouth every 8 (eight) hours as needed for muscle spasms.    ferrous sulfate 325 (65 FE) MG EC tablet Take 1 tablet (325 mg total) by mouth daily with breakfast.    gabapentin 300 MG capsule Commonly known as: NEURONTIN Take 300 mg by mouth 2 (two) times daily.    insulin aspart 100 UNIT/ML injection Commonly known as: novoLOG Inject 4 Units into the skin 3 (three) times daily with meals. Replaces: insulin aspart 100 UNIT/ML injection    insulin glargine-yfgn 100 UNIT/ML injection Commonly known as: SEMGLEE Inject 0.4 mLs (40 Units total) into the skin daily. Start taking on: October 05, 2022    melatonin 3 MG Tabs tablet  Take 1 tablet (3 mg total) by mouth at bedtime as needed.    midodrine 10 MG tablet Commonly known as: PROAMATINE Take 1 tablet (10 mg total) by mouth 3 (three) times daily with meals.    multivitamin with minerals Tabs tablet Take 1 tablet by mouth daily.      Relevant Imaging Results:  Relevant Lab Results:   Additional Information SS: [id number]  Garret Reddish, RN

## 2022-10-04 NOTE — Progress Notes (Signed)
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  CT scan showed saddle pulmonary emboli, patient was seen by vascular surgery, emergent thrombectomy was performed on 6/6.  Patient also had a significant hypotension, required Levophed in ICU. Echocardiogram showed normal ejection fraction, with increased pulmonary pressure consistent with acute cor pulmonale.  Duplex ultrasound showed bilateral lower extremity DVT. 6/8. Patient had a persistent hypotension, she was placed on hydrocortisone stress dose for adrenal crisis, blood pressure is better. Patient also developed acute hypoxemia, started bronchodilator. 6/9.  Give her lower dose IV Lasix, transferred to regular medical floor. Anticoagulation changed to Eliquis. Patient condition has improved, steroids have reduced to 2.5 mg daily 6/14, blood pressure more stable.  Patient ready for discharge, but no place to go.  Assessment and Plan:  Acute hypoxemic respiratory failure. Nocturnal hypoxia likely due to obstructive sleep apnea. Acute respiratory failure most likely secondary to acute cor pulmonale, patient hadsignificant hypoxemia on admission was placed on 10 L oxygen.   Procalcitonin level not elevated, no pneumonia.  Patient continued to require oxygen at nighttime, this could be secondary to obstructive sleep apnea.    She is stable on room air during the day  Disposition Patient has capacity. Group home has accepted her can discharge Monday, patient in agreement. Ppd placed, will need to be read on Monday. Placed right forearm,  nurse says she circled it   Back pain. Patient has been complaining of back pain, no significant tenderness in the spine.  Continue Lidoderm patch.  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 Obstructive cardiogenic shock. Bilateral lower extremity DVT. Patient is status post pulmonary thrombectomy.   Patient had been on heparin drip since admission, she was transitioned to Eliquis on 6/9.   Hypotension. Likely adrenal crisis. Patient states that she was on steroids long-term, recently discontinued.  Her blood pressure was low normally, but more significant in the hospital.  She most likely has adrenal crisis,  She was on stress dose steroids.   Midodrine added on 6/17 Now weaned off prednisone Patient refused am labs today despite multiple attempts. Lab finally collected cortisol and dheas around noon but unfortunately that timing will make the results less reliable   Acute blood loss anemia secondary to vaginal bleeding. Iron deficient anemia. Patient had received a unit of blood transfusion at admission, also received IV iron.  Continue oral iron.  Hemoglobin is more stable. B12 level still borderline, homocystine level normal.  No additional B12 needed.   Type 2 diabetes uncontrolled with hyperglycemia. Glucose wnl, continue current regimen.   History of stroke with extremity weakness. Continue home medicines,   Obesity.  BMI 32.17. Diet and exercise advised.   Patient condition stable, no change in treatment plan.     Subjective: No new issues. Tolerating diet. No dyspnea.    Physical Exam: Vitals:   10/03/22 1600 10/03/22 2100 10/04/22 0337 10/04/22 0802  BP: 120/68 127/74 (!) 97/55 (!) 88/58  Pulse: 71 71 74 89  Resp:  17 18 18 18   Temp: 98.1 F (36.7 C) 98 F (36.7 C) 97.7 F (36.5 C) 98 F (36.7 C)  TempSrc:  Oral Oral   SpO2: 97% 95% 96% 96%  Weight:       Height:       General exam: Appears calm and comfortable  Respiratory system: Clear to auscultation. Respiratory effort normal. Cardiovascular system: S1 & S2 heard, RRR. No JVD, murmurs, rubs, gallops or clicks. No pedal edema. Gastrointestinal system: Abdomen is nondistended, soft and nontender. No organomegaly or masses felt. Normal bowel sounds heard. Central nervous system: Alert and oriented x4. left-sided weakness Extremities: weakness left side Skin: No rashes, lesions or ulcers Psychiatry:  Affect flat Data Reviewed:  There are no new results to review at this time.  Family Communication: None at bedside  Disposition: Status is: Inpatient Remains inpatient appropriate because: Medically stable, waiting for placement  Planned Discharge Destination: residential care on monday   DVT prophylaxis-Eliquis Time spent: 25 minutes  Author: Silvano Bilis, MD 10/04/2022 2:08 PM  For on call review [email or web address].

## 2022-10-05 LAB — GLUCOSE, CAPILLARY
Glucose-Capillary: 129 mg/dL — ABNORMAL HIGH (ref 70–99)
Glucose-Capillary: 225 mg/dL — ABNORMAL HIGH (ref 70–99)
Glucose-Capillary: 167 mg/dL — ABNORMAL HIGH (ref 70–99)
Glucose-Capillary: 225 mg/dL — ABNORMAL HIGH (ref 70–99)

## 2022-10-05 LAB — CORTISOL-AM, BLOOD: Cortisol - AM: 14.8 ug/dL (ref 6.7–22.6)

## 2022-10-05 MED ORDER — COSYNTROPIN 0.25 MG IJ SOLR
0.2500 mg | Freq: Once | INTRAMUSCULAR | Status: DC
  Filled 2022-10-05: qty 0.25

## 2022-10-05 NOTE — TOC Progression Note (Signed)
Transition of Care [facility]) - Progression Note    Patient Details  Name: Brandi Myers MRN: [id number] Date of Birth: [birth year]  Transition of Care [facility]) CM/SW Contact  Liliana Cline, LCSW Phone Number: 10/05/2022, 9:38 AM  Clinical Narrative:    Malon Kindle states he needs IV medications removed from [facility]. Notified MD and RNCM assigned to patient today.    Expected Discharge Plan: Home/Self Care Barriers to Discharge: Continued Medical Work up  Expected Discharge Plan and Services         Expected Discharge Date: 09/23/22                                     Social Determinants of Health (SDOH) Interventions SDOH Screenings   Food Insecurity: Patient Unable To Answer (09/27/2022)  Housing: High Risk (09/27/2022)  Transportation Needs: Unknown (09/27/2022)  Utilities: Patient Unable To Answer (09/27/2022)  Tobacco Use: High Risk (09/19/2022)    Readmission Risk Interventions    09/22/2022    3:54 PM  Readmission Risk Prevention Plan  Transportation Screening Complete  PCP or Specialist Appt within 5-7 Days Complete  Home Care Screening Complete  Medication Review (RN CM) Complete

## 2022-10-05 NOTE — Progress Notes (Signed)
Mobility Specialist - Progress Note    10/05/22 1239  Mobility  Activity Stood at bedside;Dangled on edge of bed  Level of Assistance Modified independent, requires aide device or extra time  Assistive Device Front wheel walker  Range of Motion/Exercises Active  LLE Weight Bearing WBAT  Activity Response Tolerated well  Mobility Referral Yes  $Mobility charge 1 Mobility  Mobility Specialist Start Time (ACUTE ONLY) 1205  Mobility Specialist Stop Time (ACUTE ONLY) 1235  Mobility Specialist Time Calculation (min) (ACUTE ONLY) 30 min   Pt resting in bed on RA upon entry. Pt performed bed mobility ModI and Pt STS ModI at bedside with RW x4 utilizing bed raling as support to stand independently (right hand). Pt had left side weakness in arm and leg. Pt performs LLE/ULE and RLE/RUE press downs for 7 seconds. Pt gaining strength in LLE. Strength remains deminished in ULE despite pt best efforts. Pt returned to bed and left with needs in reach.   Johnathan Hausen Mobility Specialist 10/05/22, 12:48 PM

## 2022-10-05 NOTE — Plan of Care (Signed)
  Problem: Education: Goal: Knowledge of General Education information will improve Description: Including pain rating scale, medication(s)/side effects and non-pharmacologic comfort measures Outcome: Progressing   Problem: Health Behavior/Discharge Planning: Goal: Ability to manage health-related needs will improve Outcome: Progressing   Problem: Clinical Measurements: Goal: Ability to maintain clinical measurements within normal limits will improve Outcome: Progressing Goal: Will remain free from infection Outcome: Progressing Goal: Diagnostic test results will improve Outcome: Progressing Goal: Respiratory complications will improve Outcome: Progressing Goal: Cardiovascular complication will be avoided Outcome: Progressing   Problem: Activity: Goal: Risk for activity intolerance will decrease Outcome: Progressing   Problem: Education: Goal: Ability to describe self-care measures that may prevent or decrease complications (Diabetes Survival Skills Education) will improve Outcome: Progressing Goal: Individualized Educational Video(s) Outcome: Progressing   Problem: Coping: Goal: Ability to adjust to condition or change in health will improve Outcome: Progressing   Problem: Fluid Volume: Goal: Ability to maintain a balanced intake and output will improve Outcome: Progressing   Problem: Health Behavior/Discharge Planning: Goal: Ability to identify and utilize available resources and services will improve Outcome: Progressing Goal: Ability to manage health-related needs will improve Outcome: Progressing   Problem: Metabolic: Goal: Ability to maintain appropriate glucose levels will improve Outcome: Progressing   Problem: Nutritional: Goal: Maintenance of adequate nutrition will improve Outcome: Progressing Goal: Progress toward achieving an optimal weight will improve Outcome: Progressing   Problem: Skin Integrity: Goal: Risk for impaired skin integrity will  decrease Outcome: Progressing   Problem: Tissue Perfusion: Goal: Adequacy of tissue perfusion will improve Outcome: Progressing   

## 2022-10-05 NOTE — Progress Notes (Signed)
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  CT scan showed saddle pulmonary emboli, patient was seen by vascular surgery, emergent thrombectomy was performed on 6/6.  Patient also had a significant hypotension, required Levophed in ICU. Echocardiogram showed normal ejection fraction, with increased pulmonary pressure consistent with acute cor pulmonale.  Duplex ultrasound showed bilateral lower extremity DVT. 6/8. Patient had a persistent hypotension, she was placed on hydrocortisone stress dose for adrenal crisis, blood pressure is better. Patient also developed acute hypoxemia, started bronchodilator. 6/9.  Give her lower dose IV Lasix, transferred to regular medical floor. Anticoagulation changed to Eliquis. Patient condition has improved, steroids have reduced to 2.5 mg daily 6/14, blood pressure more stable.  Patient ready for discharge, but no place to go.  Assessment and Plan:  Acute hypoxemic respiratory failure. Nocturnal hypoxia likely due to obstructive sleep apnea. Acute respiratory failure most likely secondary to acute cor pulmonale, patient hadsignificant hypoxemia on admission was placed on 10 L oxygen.   Procalcitonin level not elevated, no pneumonia.  Patient continued to require oxygen at nighttime, this could be secondary to obstructive sleep apnea.    She is stable on room air during the day  Disposition Patient has capacity. Group home has accepted her can discharge Monday, patient in agreement. Ppd placed, will need to be read on Monday. Placed right forearm,  nurse says she circled it   Back pain. Patient has been complaining of back pain, no significant tenderness in the spine.  Continue Lidoderm patch.  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 Obstructive cardiogenic shock. Bilateral lower extremity DVT. Patient is status post pulmonary thrombectomy.   Patient had been on heparin drip since admission, she was transitioned to Eliquis on 6/9.   Hypotension. Possible adrenal insufficiency Denies prior steroids. Her blood pressure was low normally, but more significant in the hospital.  She was treated here with stress dose steroids.   Midodrine added on 6/17 Now weaned off prednisone Am cortisol today, if elevated makes adrenal insufficiency unlikely, if low may be spurious given exogenous steroids here   Acute blood loss anemia secondary to vaginal bleeding. Iron deficient anemia. Patient had received a unit of blood transfusion at admission, also received IV iron.  Continue oral iron.  Hemoglobin is more stable. B12 level still borderline, homocystine level normal.  No additional B12 needed.   Type 2 diabetes uncontrolled with hyperglycemia. Glucose wnl, continue current regimen.   History of stroke with extremity weakness. Continue home medicines,   Obesity.  BMI 32.17. Diet and exercise advised.   Patient condition stable, no change in treatment plan.     Subjective: No new issues. Tolerating diet. No dyspnea.    Physical Exam: Vitals:   10/04/22 2059 10/05/22 0210 10/05/22 0506 10/05/22 0840  BP: 107/75  (!) 88/61 93/62  Pulse: (!) 101  76 87  Resp: 20  16 17   Temp: 98.3 F (36.8 C)  98.4 F (36.9 C) 98.2 F (36.8 C)  TempSrc:  SpO2: 94%  92% 95%  Weight:  87.3 kg    Height:       General exam: Appears calm and comfortable  Respiratory system: Clear to auscultation. Respiratory effort normal. Cardiovascular  system: S1 & S2 heard, RRR. No JVD, murmurs, rubs, gallops or clicks. No pedal edema. Gastrointestinal system: Abdomen is nondistended, soft and nontender. No organomegaly or masses felt. Normal bowel sounds heard. Central nervous system: Alert and oriented x4. left-sided weakness Extremities: weakness left side Skin: No rashes, lesions or ulcers Psychiatry:  Affect flat Data Reviewed:  There are no new results to review at this time.  Family Communication: None at bedside  Disposition: Status is: Inpatient Remains inpatient appropriate because: Medically stable, waiting for placement  Planned Discharge Destination: residential care on monday   DVT prophylaxis-Eliquis Time spent: 25 minutes  Author: Silvano Bilis, MD 10/05/2022 11:04 AM  For on call review [email or web address].

## 2022-10-06 LAB — ACTH STIMULATION, 3 TIME POINTS
Cortisol, 30 Min: 19 ug/dL
Cortisol, 60 Min: 20.7 ug/dL
Cortisol, Base: 3.9 ug/dL

## 2022-10-06 LAB — GLUCOSE, CAPILLARY
Glucose-Capillary: 165 mg/dL — ABNORMAL HIGH (ref 70–99)
Glucose-Capillary: 236 mg/dL — ABNORMAL HIGH (ref 70–99)

## 2022-10-06 MED ORDER — LORATADINE 10 MG PO TABS
10.0000 mg | ORAL_TABLET | Freq: Every day | ORAL | Status: DC
  Administered 2022-10-06: 10 mg via ORAL
  Filled 2022-10-06: qty 1

## 2022-10-06 MED ORDER — LORATADINE 10 MG PO TABS: 10.0000 mg | ORAL_TABLET | Freq: Every day | ORAL | Status: AC

## 2022-10-06 MED ORDER — COSYNTROPIN 0.25 MG IJ SOLR
0.2500 mg | Freq: Once | INTRAMUSCULAR | Status: AC
  Administered 2022-10-06: 0.25 mg via INTRAMUSCULAR
  Filled 2022-10-06: qty 0.25

## 2022-10-06 NOTE — TOC Transition Note (Addendum)
Transition of Care [facility]) - CM/SW Discharge Note   Patient Details  Name: BRILEE PORT MRN: [id number] Date of Birth: [birth year]  Transition of Care [facility]) CM/SW Contact:  Allena Katz, LCSW Phone Number: 10/06/2022, 1:12 PM   Clinical Narrative:   Pt has orders to discharge to [facility]. DC summary and fl2 to be emailed to [place]. [name] reports he has checked all meds with the pharmacy and they are good tor receive patient whenever she is ready, CSW spoke with patient who is agreeable. Wheelchair in room of patients, safe transport arranged, anthony notified.    Final next level of care: Group Home Barriers to Discharge: Barriers Resolved   Patient Goals and CMS Choice CMS Medicare.gov Compare Post Acute Care list provided to:: Patient Choice offered to / list presented to : Patient  Discharge Placement                         Discharge Plan and Services Additional resources added to the After Visit Summary for                                       Social Determinants of Health (SDOH) Interventions SDOH Screenings   Food Insecurity: Patient Unable To Answer (09/27/2022)  Housing: High Risk (09/27/2022)  Transportation Needs: Unknown (09/27/2022)  Utilities: Patient Unable To Answer (09/27/2022)  Tobacco Use: High Risk (09/19/2022)     Readmission Risk Interventions    09/22/2022    3:54 PM  Readmission Risk Prevention Plan  Transportation Screening Complete  PCP or Specialist Appt within 5-7 Days Complete  Home Care Screening Complete  Medication Review (RN CM) Complete

## 2022-10-06 NOTE — Progress Notes (Signed)
Patient noted to be rolling in her w/c out of hospital. Patient states she is "going outside." Patient reminded she had to have supervision to go out of the facility. Pt began cussing at staff members. Pt asked if she was going to smoke outside. Pt stated "That's bullsh*t. I'm a f*cking grown a** woman." Pt reminded it was a smoke free facility and we could not take her outside to smoke. Pt assisted back to room via her w/c.

## 2022-10-06 NOTE — Progress Notes (Addendum)
Tuberculin test administered 10/03/2022 1206 pm. Read 10/06/2022 0803 am. No induration, redness or swelling present. Negative read.

## 2022-10-06 NOTE — NC FL2 (Signed)
[place] MEDICAID FL2 LEVEL OF CARE FORM     IDENTIFICATION  Patient Name: Brandi Myers Birthdate: 03-22-1986 Sex: female Admission Date (Current Location): 09/18/2022  [place] and [place] Number:  [address] [id number] Facility and Address:  [facility], [address], [place], [place] [address]      Provider Number: [id number]  Attending Physician Name and Address:  Kathrynn Running, MD  Relative Name and Phone Number:  Vena Rua (Mother) [phone] [facility])    Current Level of Care: Hospital Recommended Level of Care: Other (Comment) (rest home) Prior Approval Number:    Date Approved/Denied:   PASRR Number: [id number]  Discharge Plan: Domiciliary (Rest home)    Current Diagnoses: Patient Active Problem List   Diagnosis Date Noted   Nocturnal hypoxemia 09/24/2022   Acute hypoxemic respiratory failure (HCC) 09/21/2022   Adrenal crisis (HCC) 09/20/2022   Acute blood loss anemia 09/20/2022   Obesity (BMI 30.0-34.9) 09/20/2022   Uncontrolled type 2 diabetes mellitus with hyperglycemia (HCC) 09/20/2022   History of stroke with current residual effects 09/20/2022   Leg DVT (deep venous thromboembolism), acute, bilateral (HCC) 09/20/2022   Obstructive cardiovascular shock (HCC) 09/19/2022   Acute saddle pulmonary embolism with acute cor pulmonale (HCC) 09/18/2022   Iron deficiency anemia 09/18/2022   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   Fever    Acute occlusion of artery of upper extremity due to thrombus (HCC)    Dysphagia, pharyngoesophageal phase    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, without long-term current use of insulin 07/28/2020   Benign essential hypertension 07/28/2020   Thrombocytosis    Leukocytosis    Arterial thrombosis (HCC)    Stroke (cerebrum) (HCC) 07/04/2020   Middle  cerebral artery embolism, 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   Cellulitis 02/26/2016    Orientation RESPIRATION BLADDER Height & Weight     Self, Time, Situation, Place  Normal Continent Weight: 195 lb 5.2 oz (88.6 kg) Height:  5\' 5"  (165.1 cm)  BEHAVIORAL SYMPTOMS/MOOD NEUROLOGICAL BOWEL NUTRITION STATUS      Continent Diet (carb modified)  AMBULATORY STATUS COMMUNICATION OF NEEDS Skin    (wheelchair) Verbally Bruising                       Personal Care Assistance Level of Assistance  Bathing, Feeding, Dressing Bathing Assistance: Independent Feeding assistance: Independent Dressing Assistance: Independent     Functional Limitations Info  Sight, Hearing, Speech Sight Info: Adequate Hearing Info: Adequate Speech Info: Adequate    SPECIAL CARE FACTORS FREQUENCY                       Contractures Contractures Info: Not present    Additional Factors Info  Code Status, Allergies Code Status Info: full Allergies Info: Allergies: Norco (Hydrocodone-acetaminophen)           Current Medications (10/06/2022):  This is the current hospital active medication list Current Facility-Administered Medications  Medication Dose Route Frequency Provider Last Rate Last Admin   albuterol (PROVENTIL) (2.5 MG/3ML) 0.083% nebulizer solution 3 mL  3 mL Inhalation Q6H PRN Marrion Coy, MD   3 mL at 09/22/22 0235   apixaban (ELIQUIS) tablet 5 mg  5 mg Oral BID Mila Merry A, RPH   5 mg at 10/06/22 0803   atorvastatin (LIPITOR) tablet 40 mg  40 mg Oral Daily Marrion Coy, MD  40 mg at 10/06/22 0803   cyclobenzaprine (FLEXERIL) tablet 10 mg  10 mg Oral Q8H PRN Marrion Coy, MD   10 mg at 10/05/22 2210   docusate sodium (COLACE) capsule 100 mg  100 mg Oral BID PRN Schnier, Latina Craver, MD       gabapentin (NEURONTIN) capsule 200 mg  200 mg Oral BID Erin Fulling, MD   200 mg at 10/06/22 0803   ibuprofen (ADVIL) tablet 400 mg  400 mg Oral Q6H PRN Delfino Lovett, MD   400 mg at 10/03/22 0820   insulin  aspart (novoLOG) injection 0-5 Units  0-5 Units Subcutaneous QHS Marrion Coy, MD   2 Units at 10/05/22 2211   insulin aspart (novoLOG) injection 0-9 Units  0-9 Units Subcutaneous TID WC Marrion Coy, MD   3 Units at 10/06/22 1245   insulin aspart (novoLOG) injection 4 Units  4 Units Subcutaneous TID WC Marrion Coy, MD   4 Units at 10/06/22 1244   insulin glargine-yfgn (SEMGLEE) injection 40 Units  40 Units Subcutaneous Daily Marrion Coy, MD   40 Units at 10/06/22 0900   iodixanol (VISIPAQUE) 320 MG/ML injection    PRN Schnier, Latina Craver, MD   80 mL at 09/18/22 1833   loratadine (CLARITIN) tablet 10 mg  10 mg Oral Daily Kathrynn Running, MD   10 mg at 10/06/22 1245   melatonin tablet 2.5 mg  2.5 mg Oral QHS Tressie Ellis, RPH   2.5 mg at 10/05/22 2210   midodrine (PROAMATINE) tablet 10 mg  10 mg Oral TID WC Delfino Lovett, MD   10 mg at 10/06/22 1245   oxyCODONE-acetaminophen (PERCOCET/ROXICET) 5-325 MG per tablet 1 tablet  1 tablet Oral Q8H PRN Delfino Lovett, MD   1 tablet at 10/06/22 0920   polyethylene glycol (MIRALAX / GLYCOLAX) packet 17 g  17 g Oral Daily PRN Renford Dills, MD   17 g at 10/03/22 7829     Discharge Medications: Please see discharge summary for a list of discharge medications.   STOP taking these medications     amoxicillin 500 MG capsule Commonly known as: AMOXIL    aspirin EC 81 MG tablet    insulin aspart 100 UNIT/ML injection Commonly known as: novoLOG Replaced by: insulin aspart 100 UNIT/ML injection    insulin detemir 100 UNIT/ML injection Commonly known as: LEVEMIR    metoprolol tartrate 50 MG tablet Commonly known as: LOPRESSOR    risperiDONE 0.5 MG tablet Commonly known as: RISPERDAL    sertraline 25 MG tablet Commonly known as: ZOLOFT    traMADol 50 MG tablet Commonly known as: Ultram           TAKE these medications     acetaminophen 500 MG tablet Commonly known as: TYLENOL Take 500 mg by mouth every 6 (six) hours as needed for  headache, fever, moderate pain or mild pain. What changed: Another medication with the same name was removed. Continue taking this medication, and follow the directions you see here.    albuterol 108 (90 Base) MCG/ACT inhaler Commonly known as: VENTOLIN HFA Inhale 1-2 puffs into the lungs every 6 (six) hours as needed for wheezing or shortness of breath.    apixaban 5 MG Tabs tablet Commonly known as: ELIQUIS Take 1 tablet (5 mg total) by mouth 2 (two) times daily.    atorvastatin 40 MG tablet Commonly known as: LIPITOR Take 1 tablet (40 mg total) by mouth daily.    cyclobenzaprine 10 MG tablet  Commonly known as: FLEXERIL Take 10 mg by mouth every 8 (eight) hours as needed for muscle spasms.    ferrous sulfate 325 (65 FE) MG EC tablet Take 1 tablet (325 mg total) by mouth daily with breakfast.    gabapentin 300 MG capsule Commonly known as: NEURONTIN Take 300 mg by mouth 2 (two) times daily.    insulin aspart 100 UNIT/ML injection Commonly known as: novoLOG Inject 4 Units into the skin 3 (three) times daily with meals. Replaces: insulin aspart 100 UNIT/ML injection    insulin glargine-yfgn 100 UNIT/ML injection Commonly known as: SEMGLEE Inject 0.4 mLs (40 Units total) into the skin daily.    loratadine 10 MG tablet Commonly known as: CLARITIN Take 1 tablet (10 mg total) by mouth daily. Start taking on: October 07, 2022    melatonin 3 MG Tabs tablet Take 1 tablet (3 mg total) by mouth at bedtime as needed.    multivitamin with minerals Tabs tablet Take 1 tablet by mouth daily.      Relevant Imaging Results:  Relevant Lab Results:   Additional Information SS: [id number]  Allena Katz, LCSW

## 2022-10-07 LAB — DHEA-SULFATE: DHEA-SO4: 99.6 ug/dL (ref 57.3–279.2)

## 2022-10-23 ENCOUNTER — Ambulatory Visit (INDEPENDENT_AMBULATORY_CARE_PROVIDER_SITE_OTHER): Payer: Medicaid Other | Admitting: Nurse Practitioner

## 2022-11-13 ENCOUNTER — Ambulatory Visit (INDEPENDENT_AMBULATORY_CARE_PROVIDER_SITE_OTHER): Payer: Medicaid Other | Admitting: Nurse Practitioner

## 2023-02-21 ENCOUNTER — Emergency Department: Payer: Medicare Other

## 2023-02-21 ENCOUNTER — Encounter: Payer: Self-pay | Admitting: Emergency Medicine

## 2023-02-21 ENCOUNTER — Emergency Department
Admission: EM | Admit: 2023-02-21 | Discharge: 2023-02-21 | Disposition: A | Discharge status: Home / Self Care | Payer: Medicare Other | Attending: Emergency Medicine | Admitting: Emergency Medicine

## 2023-02-21 ENCOUNTER — Other Ambulatory Visit: Payer: Self-pay

## 2023-02-21 DIAGNOSIS — S01112A Laceration without foreign body of left eyelid and periocular area, initial encounter: Secondary | ICD-10-CM | POA: Diagnosis present

## 2023-02-21 DIAGNOSIS — W19XXXA Unspecified fall, initial encounter: Secondary | ICD-10-CM

## 2023-02-21 DIAGNOSIS — R519 Headache, unspecified: Secondary | ICD-10-CM | POA: Insufficient documentation

## 2023-02-21 DIAGNOSIS — W01190A Fall on same level from slipping, tripping and stumbling with subsequent striking against furniture, initial encounter: Secondary | ICD-10-CM | POA: Diagnosis not present

## 2023-02-21 NOTE — ED Notes (Signed)
EMS arrived to take pt home.

## 2023-02-21 NOTE — Discharge Instructions (Addendum)
Please use ibuprofen (Motrin) up to 800 mg every 8 hours, naproxen (Naprosyn) up to 500 mg every 12 hours, and/or acetaminophen (Tylenol) up to 4 g/day for any continued pain.  Please do not use this medication regimen for longer than 7 days

## 2023-02-21 NOTE — ED Notes (Signed)
This RN attempted to call multiple family members to ask for them to come pick up pt. Only one member answered and said he was unable to come get her. 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 Pt states she gets around by wheelchair at home. Pt does not have her wheelchair with her because she came by EMS. Pt is unable to get into a cab. This RN arranging a ride home by ambo.

## 2023-02-21 NOTE — ED Triage Notes (Addendum)
Pt states she has paralysis to her left side at baseline, stood on her right leg to reach across the table for an ash tray and fell striking her head/face on the table. She was "out" until her son discovered her. "Somewhere between lunch and 4: 00" injury to left eye and chin visible in triage.

## 2023-02-21 NOTE — ED Notes (Signed)
First nurse note:  BIB [facility] for a fall. Pt fell between noon and 4pm. Pt did hit her head. Laceration to left eye brow. Dried blood.Pt has full paralysis on left side, which is normal/baseline due to previous stroke in 2022. Pt told EMS she did lose consciousness. Pt is alert and oriented x 4. Pt is not prescribed eliquis anymore but has a bottle and told ems she has been taking it. Per EMS pt is non-compliant with her regular medications.   BGL 279 113/70 105 HR  100% RA

## 2023-02-21 NOTE — ED Provider Notes (Signed)
[facility] Provider Note   Event Date/Time   First MD Initiated Contact with Patient 02/21/23 2221     (approximate) History  Fall  HPI Brandi Myers is a 37 y.o. female who presents after a mechanical fall striking the left side of her face onto a table.  Patient states that she did lose consciousness today and was found by her son.  Patient has a small laceration to the left lateral eyelid and a bruise to the left chin. ROS: Patient currently denies any vision changes, tinnitus, difficulty speaking, facial droop, sore throat, chest pain, shortness of breath, abdominal pain, nausea/vomiting/diarrhea, dysuria, or weakness/numbness/paresthesias in any extremity   Physical Exam  Triage Vital Signs: ED Triage Vitals  Encounter Vitals Group     BP 02/21/23 1953 108/73     Systolic BP Percentile --      Diastolic BP Percentile --      Pulse Rate 02/21/23 1953 (!) 107     Resp 02/21/23 1953 18     Temp 02/21/23 1953 98.1 F (36.7 C)     Temp Source 02/21/23 1953 Oral     SpO2 02/21/23 1953 95 %     Weight --      Height --      Head Circumference --      Peak Flow --      Pain Score 02/21/23 1949 6     Pain Loc --      Pain Education --      Exclude from Growth Chart --    Most recent vital signs: Vitals:   02/21/23 2300 02/21/23 2315  BP: 115/82   Pulse:  95  Resp:    Temp:    SpO2: 100%    General: Awake, oriented x4. CV:  Good peripheral perfusion.  Resp:  Normal effort.  Abd:  No distention.  Other:  Middle-aged overweight [name] resting comfortably in no acute distress.  1 cm superficial laceration to the left lateral eyelid ED Results / Procedures / Treatments  Labs (all labs ordered are listed, but only abnormal results are displayed) Labs Reviewed - No data to display RADIOLOGY ED MD interpretation: CT of the head, neck, and maxillofacial structures interpreted independently by me and shows no evidence of acute fractures,  dislocations, intracranial hemorrhage, or malocclusion -Agree with radiology assessment Official radiology report(s): CT Head Wo Contrast  Result Date: 02/21/2023 CLINICAL DATA:  Head trauma, moderate-severe; Neck trauma, dangerous injury mechanism (Age 30-64y); Facial trauma, blunt EXAM: CT HEAD WITHOUT CONTRAST CT MAXILLOFACIAL WITHOUT CONTRAST CT CERVICAL SPINE WITHOUT CONTRAST TECHNIQUE: Multidetector CT imaging of the head, cervical spine, and maxillofacial structures were performed using the standard protocol without intravenous contrast. Multiplanar CT image reconstructions of the cervical spine and maxillofacial structures were also generated. RADIATION DOSE REDUCTION: This exam was performed according to the departmental dose-optimization program which includes automated exposure control, adjustment of the mA and/or kV according to patient size and/or use of iterative reconstruction technique. COMPARISON:  CT head 07/11/2023 FINDINGS: CT HEAD FINDINGS Brain: Large remote right middle cerebral artery territory infarction. No evidence of large-territorial acute infarction. No parenchymal hemorrhage. No mass lesion. No extra-axial collection. No mass effect or midline shift. No hydrocephalus. Basilar cisterns are patent. Vascular: No hyperdense vessel. Skull: No acute fracture or focal lesion.  Prior right craniotomy. Other: None. CT MAXILLOFACIAL FINDINGS Osseous: No fracture or mandibular dislocation. No destructive process. Patient is edentulous. Sinuses/Orbits: Bilateral maxillary sinus mucosal thickening. Paranasal sinuses  and mastoid air cells are clear. The orbits are unremarkable. Soft tissues: Negative. CT CERVICAL SPINE FINDINGS Alignment: Normal. Skull base and vertebrae: No acute fracture. No aggressive appearing focal osseous lesion or focal pathologic process. Soft tissues and spinal canal: No prevertebral fluid or swelling. No visible canal hematoma. Upper chest: Unremarkable. Other: None.  IMPRESSION: 1. No acute intracranial abnormality. 2.  No acute displaced facial fracture. 3. No acute displaced fracture or traumatic listhesis of the cervical spine. Electronically Signed   By: Tish Frederickson M.D.   On: 02/21/2023 20:47   CT Cervical Spine Wo Contrast  Result Date: 02/21/2023 CLINICAL DATA:  Head trauma, moderate-severe; Neck trauma, dangerous injury mechanism (Age 21-64y); Facial trauma, blunt EXAM: CT HEAD WITHOUT CONTRAST CT MAXILLOFACIAL WITHOUT CONTRAST CT CERVICAL SPINE WITHOUT CONTRAST TECHNIQUE: Multidetector CT imaging of the head, cervical spine, and maxillofacial structures were performed using the standard protocol without intravenous contrast. Multiplanar CT image reconstructions of the cervical spine and maxillofacial structures were also generated. RADIATION DOSE REDUCTION: This exam was performed according to the departmental dose-optimization program which includes automated exposure control, adjustment of the mA and/or kV according to patient size and/or use of iterative reconstruction technique. COMPARISON:  CT head 07/11/2023 FINDINGS: CT HEAD FINDINGS Brain: Large remote right middle cerebral artery territory infarction. No evidence of large-territorial acute infarction. No parenchymal hemorrhage. No mass lesion. No extra-axial collection. No mass effect or midline shift. No hydrocephalus. Basilar cisterns are patent. Vascular: No hyperdense vessel. Skull: No acute fracture or focal lesion.  Prior right craniotomy. Other: None. CT MAXILLOFACIAL FINDINGS Osseous: No fracture or mandibular dislocation. No destructive process. Patient is edentulous. Sinuses/Orbits: Bilateral maxillary sinus mucosal thickening. Paranasal sinuses and mastoid air cells are clear. The orbits are unremarkable. Soft tissues: Negative. CT CERVICAL SPINE FINDINGS Alignment: Normal. Skull base and vertebrae: No acute fracture. No aggressive appearing focal osseous lesion or focal pathologic process.  Soft tissues and spinal canal: No prevertebral fluid or swelling. No visible canal hematoma. Upper chest: Unremarkable. Other: None. IMPRESSION: 1. No acute intracranial abnormality. 2.  No acute displaced facial fracture. 3. No acute displaced fracture or traumatic listhesis of the cervical spine. Electronically Signed   By: Tish Frederickson M.D.   On: 02/21/2023 20:47   CT Maxillofacial Wo Contrast  Result Date: 02/21/2023 CLINICAL DATA:  Head trauma, moderate-severe; Neck trauma, dangerous injury mechanism (Age 40-64y); Facial trauma, blunt EXAM: CT HEAD WITHOUT CONTRAST CT MAXILLOFACIAL WITHOUT CONTRAST CT CERVICAL SPINE WITHOUT CONTRAST TECHNIQUE: Multidetector CT imaging of the head, cervical spine, and maxillofacial structures were performed using the standard protocol without intravenous contrast. Multiplanar CT image reconstructions of the cervical spine and maxillofacial structures were also generated. RADIATION DOSE REDUCTION: This exam was performed according to the departmental dose-optimization program which includes automated exposure control, adjustment of the mA and/or kV according to patient size and/or use of iterative reconstruction technique. COMPARISON:  CT head 07/11/2023 FINDINGS: CT HEAD FINDINGS Brain: Large remote right middle cerebral artery territory infarction. No evidence of large-territorial acute infarction. No parenchymal hemorrhage. No mass lesion. No extra-axial collection. No mass effect or midline shift. No hydrocephalus. Basilar cisterns are patent. Vascular: No hyperdense vessel. Skull: No acute fracture or focal lesion.  Prior right craniotomy. Other: None. CT MAXILLOFACIAL FINDINGS Osseous: No fracture or mandibular dislocation. No destructive process. Patient is edentulous. Sinuses/Orbits: Bilateral maxillary sinus mucosal thickening. Paranasal sinuses and mastoid air cells are clear. The orbits are unremarkable. Soft tissues: Negative. CT CERVICAL SPINE FINDINGS  Alignment: Normal. Skull  base and vertebrae: No acute fracture. No aggressive appearing focal osseous lesion or focal pathologic process. Soft tissues and spinal canal: No prevertebral fluid or swelling. No visible canal hematoma. Upper chest: Unremarkable. Other: None. IMPRESSION: 1. No acute intracranial abnormality. 2.  No acute displaced facial fracture. 3. No acute displaced fracture or traumatic listhesis of the cervical spine. Electronically Signed   By: Tish Frederickson M.D.   On: 02/21/2023 20:47   PROCEDURES: Critical Care performed: No .1-3 Lead EKG Interpretation  Performed by: Merwyn Katos, MD Authorized by: Merwyn Katos, MD     Interpretation: normal     ECG rate:  71   ECG rate assessment: normal     Rhythm: sinus rhythm     Ectopy: none     Conduction: normal    MEDICATIONS ORDERED IN ED: Medications - No data to display IMPRESSION / MDM / ASSESSMENT AND PLAN / ED COURSE  I reviewed the triage vital signs and the nursing notes.                             The patient is on the cardiac monitor to evaluate for evidence of arrhythmia and/or significant heart rate changes. Patient's presentation is most consistent with acute presentation with potential threat to life or bodily function. Presenting after a fall that occurred just prior to arrival, resulting in injury to the left eyelid. The mechanism of injury was a mechanical ground level fall without syncope or near-syncope. The current level of pain is moderate. There was no loss of consciousness, confusion, seizure, or memory impairment. There is a laceration associated with the injury.  This laceration was superficial and was cleaned but needed no repair Denies neck pain. The patient does not take blood thinner medications. Denies vomiting, numbness/weakness, fever  Dispo: Discharge with PCP follow-up       FINAL CLINICAL IMPRESSION(S) / ED DIAGNOSES   Final diagnoses:  Fall, initial encounter  Left eyelid  laceration, initial encounter   Rx / DC Orders   ED Discharge Orders     None      Note:  This document was prepared using Dragon voice recognition software and may include unintentional dictation errors.   Merwyn Katos, MD 02/21/23 [phone]
